# Patient Record
Sex: Male | Born: 1945
Health system: Southern US, Community
[De-identification: ages and names within clinical notes are randomized; demographics above are authoritative.]

## PROBLEM LIST (undated history)

## (undated) DIAGNOSIS — R7881 Bacteremia: Secondary | ICD-10-CM

## (undated) DIAGNOSIS — I5042 Chronic combined systolic (congestive) and diastolic (congestive) heart failure: Secondary | ICD-10-CM

## (undated) DIAGNOSIS — Z91199 Patient's noncompliance with other medical treatment and regimen due to unspecified reason: Secondary | ICD-10-CM

## (undated) DIAGNOSIS — R1084 Generalized abdominal pain: Secondary | ICD-10-CM

## (undated) DIAGNOSIS — I509 Heart failure, unspecified: Secondary | ICD-10-CM

## (undated) DIAGNOSIS — I330 Acute and subacute infective endocarditis: Secondary | ICD-10-CM

## (undated) DIAGNOSIS — B9562 Methicillin resistant Staphylococcus aureus infection as the cause of diseases classified elsewhere: Secondary | ICD-10-CM

## (undated) DIAGNOSIS — G459 Transient cerebral ischemic attack, unspecified: Secondary | ICD-10-CM

## (undated) DIAGNOSIS — I350 Nonrheumatic aortic (valve) stenosis: Secondary | ICD-10-CM

## (undated) DIAGNOSIS — Z9119 Patient's noncompliance with other medical treatment and regimen: Secondary | ICD-10-CM

## (undated) DIAGNOSIS — E46 Unspecified protein-calorie malnutrition: Secondary | ICD-10-CM

## (undated) DIAGNOSIS — I10 Essential (primary) hypertension: Secondary | ICD-10-CM

## (undated) DIAGNOSIS — I2510 Atherosclerotic heart disease of native coronary artery without angina pectoris: Secondary | ICD-10-CM

## (undated) DIAGNOSIS — I4891 Unspecified atrial fibrillation: Secondary | ICD-10-CM

## (undated) DIAGNOSIS — R634 Abnormal weight loss: Secondary | ICD-10-CM

## (undated) HISTORY — PX: AORTIC VALVE REPLACEMENT: SHX41

## (undated) HISTORY — PX: HERNIA REPAIR: SHX51

## (undated) NOTE — *Deleted (*Deleted)
weakness

---
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
## 2013-10-16 DIAGNOSIS — Z953 Presence of xenogenic heart valve: Secondary | ICD-10-CM

## 2013-10-16 HISTORY — PX: CORONARY ARTERY BYPASS GRAFT: SHX141

## 2013-10-16 HISTORY — DX: Presence of xenogenic heart valve: Z95.3

## 2014-04-14 ENCOUNTER — Emergency Department (HOSPITAL_COMMUNITY): Payer: Self-pay

## 2014-04-14 ENCOUNTER — Emergency Department (HOSPITAL_COMMUNITY)
Admission: EM | Admit: 2014-04-14 | Discharge: 2014-04-14 | Disposition: A | Discharge status: Home / Self Care | Payer: Self-pay | Attending: Emergency Medicine | Admitting: Emergency Medicine

## 2014-04-14 ENCOUNTER — Emergency Department (HOSPITAL_COMMUNITY): Payer: Medicare Other

## 2014-04-14 ENCOUNTER — Encounter (HOSPITAL_COMMUNITY): Payer: Self-pay | Admitting: Emergency Medicine

## 2014-04-14 DIAGNOSIS — Z87891 Personal history of nicotine dependence: Secondary | ICD-10-CM | POA: Insufficient documentation

## 2014-04-14 DIAGNOSIS — Z7982 Long term (current) use of aspirin: Secondary | ICD-10-CM | POA: Insufficient documentation

## 2014-04-14 DIAGNOSIS — I10 Essential (primary) hypertension: Secondary | ICD-10-CM | POA: Insufficient documentation

## 2014-04-14 DIAGNOSIS — Z79899 Other long term (current) drug therapy: Secondary | ICD-10-CM | POA: Insufficient documentation

## 2014-04-14 DIAGNOSIS — R110 Nausea: Secondary | ICD-10-CM | POA: Insufficient documentation

## 2014-04-14 DIAGNOSIS — R531 Weakness: Secondary | ICD-10-CM

## 2014-04-14 DIAGNOSIS — R630 Anorexia: Secondary | ICD-10-CM | POA: Insufficient documentation

## 2014-04-14 DIAGNOSIS — IMO0001 Reserved for inherently not codable concepts without codable children: Secondary | ICD-10-CM | POA: Insufficient documentation

## 2014-04-14 DIAGNOSIS — K089 Disorder of teeth and supporting structures, unspecified: Secondary | ICD-10-CM | POA: Insufficient documentation

## 2014-04-14 DIAGNOSIS — Z792 Long term (current) use of antibiotics: Secondary | ICD-10-CM | POA: Insufficient documentation

## 2014-04-14 DIAGNOSIS — R5383 Other fatigue: Principal | ICD-10-CM

## 2014-04-14 DIAGNOSIS — I509 Heart failure, unspecified: Secondary | ICD-10-CM | POA: Insufficient documentation

## 2014-04-14 DIAGNOSIS — R634 Abnormal weight loss: Secondary | ICD-10-CM | POA: Insufficient documentation

## 2014-04-14 DIAGNOSIS — R5381 Other malaise: Secondary | ICD-10-CM | POA: Insufficient documentation

## 2014-04-14 HISTORY — DX: Heart failure, unspecified: I50.9

## 2014-04-14 HISTORY — DX: Essential (primary) hypertension: I10

## 2014-04-14 LAB — CBC WITH DIFFERENTIAL/PLATELET
Basophils Absolute: 0 10*3/uL (ref 0.0–0.1)
Basophils Relative: 0 % (ref 0–1)
Eosinophils Absolute: 0 10*3/uL (ref 0.0–0.7)
Eosinophils Relative: 0 % (ref 0–5)
HCT: 31.4 % — ABNORMAL LOW (ref 39.0–52.0)
Hemoglobin: 10.2 g/dL — ABNORMAL LOW (ref 13.0–17.0)
Lymphocytes Relative: 17 % (ref 12–46)
Lymphs Abs: 0.7 10*3/uL (ref 0.7–4.0)
MCH: 26.4 pg (ref 26.0–34.0)
MCHC: 32.5 g/dL (ref 30.0–36.0)
MCV: 81.3 fL (ref 78.0–100.0)
Monocytes Absolute: 0.3 10*3/uL (ref 0.1–1.0)
Monocytes Relative: 8 % (ref 3–12)
Neutro Abs: 2.9 10*3/uL (ref 1.7–7.7)
Neutrophils Relative %: 75 % (ref 43–77)
Platelets: 80 10*3/uL — ABNORMAL LOW (ref 150–400)
RBC: 3.86 MIL/uL — ABNORMAL LOW (ref 4.22–5.81)
RDW: 16.7 % — ABNORMAL HIGH (ref 11.5–15.5)
WBC: 3.9 10*3/uL — ABNORMAL LOW (ref 4.0–10.5)

## 2014-04-14 LAB — URINE MICROSCOPIC-ADD ON

## 2014-04-14 LAB — URINALYSIS, ROUTINE W REFLEX MICROSCOPIC
Bilirubin Urine: NEGATIVE
Glucose, UA: NEGATIVE mg/dL
Hgb urine dipstick: NEGATIVE
Ketones, ur: NEGATIVE mg/dL
Leukocytes, UA: NEGATIVE
Nitrite: NEGATIVE
Protein, ur: 30 mg/dL — AB
Specific Gravity, Urine: 1.025 (ref 1.005–1.030)
Urobilinogen, UA: 0.2 mg/dL (ref 0.0–1.0)
pH: 5 (ref 5.0–8.0)

## 2014-04-14 LAB — CBG MONITORING, ED: Glucose-Capillary: 123 mg/dL — ABNORMAL HIGH (ref 70–99)

## 2014-04-14 LAB — COMPREHENSIVE METABOLIC PANEL
ALT: 13 U/L (ref 0–53)
AST: 22 U/L (ref 0–37)
Albumin: 2.7 g/dL — ABNORMAL LOW (ref 3.5–5.2)
Alkaline Phosphatase: 45 U/L (ref 39–117)
BUN: 26 mg/dL — ABNORMAL HIGH (ref 6–23)
CO2: 23 mEq/L (ref 19–32)
Calcium: 8.2 mg/dL — ABNORMAL LOW (ref 8.4–10.5)
Chloride: 102 mEq/L (ref 96–112)
Creatinine, Ser: 1.35 mg/dL (ref 0.50–1.35)
GFR calc Af Amer: 62 mL/min — ABNORMAL LOW (ref >90)
GFR calc non Af Amer: 53 mL/min — ABNORMAL LOW (ref >90)
Glucose, Bld: 116 mg/dL — ABNORMAL HIGH (ref 70–99)
Potassium: 4.3 mEq/L (ref 3.7–5.3)
Sodium: 137 mEq/L (ref 137–147)
Total Bilirubin: 0.8 mg/dL (ref 0.3–1.2)
Total Protein: 6.4 g/dL (ref 6.0–8.3)

## 2014-04-14 LAB — LACTIC ACID, PLASMA: Lactic Acid, Venous: 1.1 mmol/L (ref 0.5–2.2)

## 2014-04-14 LAB — CK: Total CK: 13 U/L (ref 7–232)

## 2014-04-14 MED ORDER — SODIUM CHLORIDE 0.9 % IV BOLUS (SEPSIS)
500.0000 mL | Freq: Once | INTRAVENOUS | Status: AC
  Administered 2014-04-14: 500 mL via INTRAVENOUS

## 2014-04-14 NOTE — ED Notes (Signed)
His daughter brought him today because she states he is unable to care for himself at home. He states hes felt "weaker and weaker since my hernia in January." He c/o pain all over his body. He is A&Ox4, resp e/u

## 2014-04-14 NOTE — ED Notes (Signed)
MD at bedside. 

## 2014-04-14 NOTE — ED Notes (Signed)
Walked pt in hallway. Pt's gait was steady. Pt stated he felt weak.

## 2014-04-14 NOTE — ED Provider Notes (Signed)
CSN: [id number]     Arrival date & time 04/14/14  0907 History   First MD Initiated Contact with Patient 04/14/14 [phone]     Chief Complaint  Patient presents with  . Weakness     (Consider location/radiation/quality/duration/timing/severity/associated sxs/prior Treatment) Patient is a 68 y.o. male presenting with weakness. The history is provided by the patient and a relative. No language interpreter was used.  Weakness The current episode started more than 1 month ago. The problem occurs constantly. The problem has been gradually worsening. Associated symptoms include fatigue, myalgias, nausea and weakness. Pertinent negatives include no abdominal pain, chest pain, coughing, fever, headaches, rash or vomiting. Associated symptoms comments: The patient lives alone in [place] where his daughter went to visit him. She found him weak, "crawling on the floor to get from place to place", with significant weight loss. He states his appetite is completely gone. He has mild nausea without vomiting, loose stool without blood or frequency. He feels sore all over without specific site of pain. He feels generally weak without weakness on one side or another. .    Past Medical History  Diagnosis Date  . CHF (congestive heart failure)   . Hypertension    Past Surgical History  Procedure Laterality Date  . Hernia repair     History reviewed. No pertinent family history. History  Substance Use Topics  . Smoking status: Former Games developermoker  . Smokeless tobacco: Not on file  . Alcohol Use: No    Review of Systems  Constitutional: Positive for appetite change, fatigue and unexpected weight change. Negative for fever.  HENT: Positive for dental problem. Negative for trouble swallowing.   Respiratory: Negative for cough and shortness of breath.   Cardiovascular: Negative for chest pain and leg swelling.  Gastrointestinal: Positive for nausea. Negative for vomiting, abdominal pain and blood in stool.   Genitourinary: Negative for dysuria.  Musculoskeletal: Positive for myalgias.  Skin: Negative for rash and wound.  Neurological: Positive for weakness. Negative for syncope and headaches.  Psychiatric/Behavioral: Negative for confusion.      Allergies  Review of patient's allergies indicates no known allergies.  Home Medications   Prior to Admission medications   Medication Sig Start Date End Date Taking? Authorizing Provider  aspirin 325 MG tablet Take 325 mg by mouth daily.   Yes Historical Provider, MD  ciprofloxacin (CIPRO) 500 MG tablet Take 500 mg by mouth 2 (two) times daily. 04/01/14  Yes Historical Provider, MD  co-enzyme Q-10 30 MG capsule Take 30 mg by mouth daily. "FORCE FACTOR"   Yes Historical Provider, MD  Cyanocobalamin (VITAMIN B-12 PO) Take 1 tablet by mouth daily.   Yes Historical Provider, MD  DC Green 6 POWD Take 5 mLs by mouth daily. Mix with water   Yes Historical Provider, MD  furosemide (LASIX) 80 MG tablet Take 80 mg by mouth daily.   Yes Historical Provider, MD  Ginger, Zingiber officinalis, (GINGER ROOT PO) Take 1 tablet by mouth daily.   Yes Historical Provider, MD  HYDROcodone-acetaminophen (NORCO/VICODIN) 5-325 MG per tablet Take 1 tablet by mouth every 4 (four) hours as needed for moderate pain.   Yes Historical Provider, MD  LYSINE PO Take 1 tablet by mouth daily.   Yes Historical Provider, MD  metroNIDAZOLE (FLAGYL) 500 MG tablet Take 500 mg by mouth every 8 (eight) hours.   Yes Historical Provider, MD  Misc Natural Products (DANDELION ROOT PO) Take 1 tablet by mouth daily.   Yes Historical Provider, MD  Misc Natural Products (MULTI-HERB PO) Take 1 tablet by mouth daily.   Yes Historical Provider, MD  ondansetron (ZOFRAN-ODT) 4 MG disintegrating tablet Take 4 mg by mouth every 6 (six) hours as needed for nausea or vomiting.   Yes Historical Provider, MD  OVER THE COUNTER MEDICATION Take 1 tablet by mouth daily. "BIOTRUST"   Yes Historical Provider, MD   promethazine (PHENERGAN) 25 MG tablet Take 25 mg by mouth 3 (three) times daily as needed for nausea or vomiting.   Yes Historical Provider, MD  spironolactone (ALDACTONE) 25 MG tablet Take 12.5 mg by mouth daily.   Yes Historical Provider, MD   There were no vitals taken for this visit. Physical Exam  Constitutional: He is oriented to person, place, and time. He appears well-developed and well-nourished. No distress.  HENT:  Head: Normocephalic and atraumatic.  Eyes: Conjunctivae are normal. Pupils are equal, round, and reactive to light.  Neck: Normal range of motion. Neck supple.  Cardiovascular: Normal rate and regular rhythm.   Pulmonary/Chest: Effort normal and breath sounds normal.  Abdominal: Soft. Bowel sounds are normal. There is no tenderness. There is no rebound and no guarding.  Musculoskeletal: Normal range of motion. He exhibits no edema.  Neurological: He is alert and oriented to person, place, and time.  No lateralizing weakness or facial asymmetry. Speech clear and focused. CN's 3-12 grossly intact.   Skin: Skin is warm and dry. No rash noted.  Psychiatric: He has a normal mood and affect.    ED Course  Procedures (including critical care time) Labs Review Labs Reviewed  CBG MONITORING, ED - Abnormal; Notable for the following:    Glucose-Capillary 123 (*)    All other components within normal limits  URINE CULTURE  CBC WITH DIFFERENTIAL  COMPREHENSIVE METABOLIC PANEL  URINALYSIS, ROUTINE W REFLEX MICROSCOPIC  LACTIC ACID, PLASMA   Results for orders placed during the hospital encounter of 04/14/14  URINE CULTURE      Result Value Ref Range   Specimen Description URINE, RANDOM     Special Requests NONE     Culture  Setup Time       Value: 04/14/2014 17:20     Performed at [facility] Count       Value: NO GROWTH     Performed at [facility]   Culture       Value: NO GROWTH     Performed at [facility]   Report  Status 04/15/2014 FINAL    CBC WITH DIFFERENTIAL      Result Value Ref Range   WBC 3.9 (*) 4.0 - 10.5 K/uL   RBC 3.86 (*) 4.22 - 5.81 MIL/uL   Hemoglobin 10.2 (*) 13.0 - 17.0 g/dL   HCT 56.2 (*) 13.0 - 86.5 %   MCV 81.3  78.0 - 100.0 fL   MCH 26.4  26.0 - 34.0 pg   MCHC 32.5  30.0 - 36.0 g/dL   RDW 78.4 (*) 69.6 - 29.5 %   Platelets 80 (*) 150 - 400 K/uL   Neutrophils Relative % 75  43 - 77 %   Neutro Abs 2.9  1.7 - 7.7 K/uL   Lymphocytes Relative 17  12 - 46 %   Lymphs Abs 0.7  0.7 - 4.0 K/uL   Monocytes Relative 8  3 - 12 %   Monocytes Absolute 0.3  0.1 - 1.0 K/uL   Eosinophils Relative 0  0 - 5 %  Eosinophils Absolute 0.0  0.0 - 0.7 K/uL   Basophils Relative 0  0 - 1 %   Basophils Absolute 0.0  0.0 - 0.1 K/uL  COMPREHENSIVE METABOLIC PANEL      Result Value Ref Range   Sodium 137  137 - 147 mEq/L   Potassium 4.3  3.7 - 5.3 mEq/L   Chloride 102  96 - 112 mEq/L   CO2 23  19 - 32 mEq/L   Glucose, Bld 116 (*) 70 - 99 mg/dL   BUN 26 (*) 6 - 23 mg/dL   Creatinine, Ser 1.611.35  0.50 - 1.35 mg/dL   Calcium 8.2 (*) 8.4 - 10.5 mg/dL   Total Protein 6.4  6.0 - 8.3 g/dL   Albumin 2.7 (*) 3.5 - 5.2 g/dL   AST 22  0 - 37 U/L   ALT 13  0 - 53 U/L   Alkaline Phosphatase 45  39 - 117 U/L   Total Bilirubin 0.8  0.3 - 1.2 mg/dL   GFR calc non Af Amer 53 (*) >90 mL/min   GFR calc Af Amer 62 (*) >90 mL/min  URINALYSIS, ROUTINE W REFLEX MICROSCOPIC      Result Value Ref Range   Color, Urine AMBER (*) YELLOW   APPearance CLOUDY (*) CLEAR   Specific Gravity, Urine 1.025  1.005 - 1.030   pH 5.0  5.0 - 8.0   Glucose, UA NEGATIVE  NEGATIVE mg/dL   Hgb urine dipstick NEGATIVE  NEGATIVE   Bilirubin Urine NEGATIVE  NEGATIVE   Ketones, ur NEGATIVE  NEGATIVE mg/dL   Protein, ur 30 (*) NEGATIVE mg/dL   Urobilinogen, UA 0.2  0.0 - 1.0 mg/dL   Nitrite NEGATIVE  NEGATIVE   Leukocytes, UA NEGATIVE  NEGATIVE  LACTIC ACID, PLASMA      Result Value Ref Range   Lactic Acid, Venous 1.1  0.5 - 2.2  mmol/L  URINE MICROSCOPIC-ADD ON      Result Value Ref Range   WBC, UA 0-2  <3 WBC/hpf   Bacteria, UA RARE  RARE   Casts HYALINE CASTS (*) NEGATIVE   Urine-Other MUCOUS PRESENT    CK      Result Value Ref Range   Total CK 13  7 - 232 U/L  CBG MONITORING, ED      Result Value Ref Range   Glucose-Capillary 123 (*) 70 - 99 mg/dL   Ct Head Wo Contrast  04/14/2014   CLINICAL DATA:  Weakness  EXAM: CT HEAD WITHOUT CONTRAST  TECHNIQUE: Contiguous axial images were obtained from the base of the skull through the vertex without intravenous contrast.  COMPARISON:  None.  FINDINGS: Mild atrophy. Mild chronic microvascular ischemic change in the white matter.  Negative for acute infarct, hemorrhage, or mass lesion. Calvarium is intact.  IMPRESSION: Mild chronic microvascular ischemia.  No acute abnormality.   Electronically Signed   By: Marlan Palauharles  Clark M.D.   On: 04/14/2014 13:41   Dg Abd Acute W/chest  04/14/2014   CLINICAL DATA:  WEAKNESS  EXAM: ACUTE ABDOMEN SERIES (ABDOMEN 2 VIEW & CHEST 1 VIEW)  COMPARISON:  None.  FINDINGS: There is no evidence of dilated bowel loops or free intraperitoneal air. No radiopaque calculi or other significant radiographic abnormality is seen. Heart size and mediastinal contours are within normal limits. Both lungs are clear. 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 The aorta is tortuous and ectatic.  IMPRESSION: Negative abdominal radiographs.  No acute cardiopulmonary disease.   Electronically Signed   By:  Salome Holmes M.D.   On: 04/14/2014 10:52   Imaging Review No results found.   EKG Interpretation None      MDM   Final diagnoses:  None    1. Weakness 2. Weight loss  The patient is eating and drinking here. Given fluid bolus. Labs and imaging are essentially without finding. He ambulates to and from the bathroom. Daughter remains with patient throughout the hospital stay. Discussed outpatient care and follow up. Dr. Ethelda Chick has seen and evaluated  the patient. Feel that discharge home is appropriate with referral for outpatient care.     Arnoldo Hooker, PA-C 04/23/14 [phone]

## 2014-04-14 NOTE — ED Notes (Signed)
Gave the pt orange juice watered down, broth and crackers since pt has not been able to eat in a week days.

## 2014-04-14 NOTE — ED Notes (Signed)
Pt walked to the bathroom independently without any assistance

## 2014-04-14 NOTE — ED Notes (Signed)
Pt stated after being weighed that he has lost 11 lbs in one week.

## 2014-04-14 NOTE — ED Provider Notes (Signed)
Patient reports she's had diminished appetite for several months. And he complains of generalized weakness. He reports a 40 pound weight loss since February 2015 since he had his inguinal hernias repaired. He denies "pain all over" complains of generalized weakness. Denies shortness of breath denies fever. States food does not taste good . He is currently staying with his daughter. Mood appeared 2 days ago from [place]. He states that he feels depressed but vehemently denies suicidal intent. On exam no distress. Alert nontoxic lungs clear auscultation heart regular rate and rhythm abdomen nondistended nontender. He does have a left hernias which a soft, nontender and easily reproducible. Of note is a right inguinal hernia scar. Also soft, nontender genitalia normal male. Patient is encouraged to drink. He is not lightheaded on standing. Gait is normal He will be referred to a dentist, and urgent care Center or internist of his choice. Results for orders placed during the hospital encounter of 04/14/14  CBC WITH DIFFERENTIAL      Result Value Ref Range   WBC 3.9 (*) 4.0 - 10.5 K/uL   RBC 3.86 (*) 4.22 - 5.81 MIL/uL   Hemoglobin 10.2 (*) 13.0 - 17.0 g/dL   HCT 96.031.4 (*) 45.439.0 - 09.852.0 %   MCV 81.3  78.0 - 100.0 fL   MCH 26.4  26.0 - 34.0 pg   MCHC 32.5  30.0 - 36.0 g/dL   RDW 11.916.7 (*) 14.711.5 - 82.915.5 %   Platelets 80 (*) 150 - 400 K/uL   Neutrophils Relative % 75  43 - 77 %   Neutro Abs 2.9  1.7 - 7.7 K/uL   Lymphocytes Relative 17  12 - 46 %   Lymphs Abs 0.7  0.7 - 4.0 K/uL   Monocytes Relative 8  3 - 12 %   Monocytes Absolute 0.3  0.1 - 1.0 K/uL   Eosinophils Relative 0  0 - 5 %   Eosinophils Absolute 0.0  0.0 - 0.7 K/uL   Basophils Relative 0  0 - 1 %   Basophils Absolute 0.0  0.0 - 0.1 K/uL  COMPREHENSIVE METABOLIC PANEL      Result Value Ref Range   Sodium 137  137 - 147 mEq/L   Potassium 4.3  3.7 - 5.3 mEq/L   Chloride 102  96 - 112 mEq/L   CO2 23  19 - 32 mEq/L   Glucose, Bld 116  (*) 70 - 99 mg/dL   BUN 26 (*) 6 - 23 mg/dL   Creatinine, Ser 5.621.35  0.50 - 1.35 mg/dL   Calcium 8.2 (*) 8.4 - 10.5 mg/dL   Total Protein 6.4  6.0 - 8.3 g/dL   Albumin 2.7 (*) 3.5 - 5.2 g/dL   AST 22  0 - 37 U/L   ALT 13  0 - 53 U/L   Alkaline Phosphatase 45  39 - 117 U/L   Total Bilirubin 0.8  0.3 - 1.2 mg/dL   GFR calc non Af Amer 53 (*) >90 mL/min   GFR calc Af Amer 62 (*) >90 mL/min  URINALYSIS, ROUTINE W REFLEX MICROSCOPIC      Result Value Ref Range   Color, Urine AMBER (*) YELLOW   APPearance CLOUDY (*) CLEAR   Specific Gravity, Urine 1.025  1.005 - 1.030   pH 5.0  5.0 - 8.0   Glucose, UA NEGATIVE  NEGATIVE mg/dL   Hgb urine dipstick NEGATIVE  NEGATIVE   Bilirubin Urine NEGATIVE  NEGATIVE   Ketones, ur NEGATIVE  NEGATIVE mg/dL   Protein, ur 30 (*) NEGATIVE mg/dL   Urobilinogen, UA 0.2  0.0 - 1.0 mg/dL   Nitrite NEGATIVE  NEGATIVE   Leukocytes, UA NEGATIVE  NEGATIVE  LACTIC ACID, PLASMA      Result Value Ref Range   Lactic Acid, Venous 1.1  0.5 - 2.2 mmol/L  URINE MICROSCOPIC-ADD ON      Result Value Ref Range   WBC, UA 0-2  <3 WBC/hpf   Bacteria, UA RARE  RARE   Casts HYALINE CASTS (*) NEGATIVE   Urine-Other MUCOUS PRESENT    CK      Result Value Ref Range   Total CK 13  7 - 232 U/L  CBG MONITORING, ED      Result Value Ref Range   Glucose-Capillary 123 (*) 70 - 99 mg/dL   Ct Head Wo Contrast  04/14/2014   CLINICAL DATA:  Weakness  EXAM: CT HEAD WITHOUT CONTRAST  TECHNIQUE: Contiguous axial images were obtained from the base of the skull through the vertex without intravenous contrast.  COMPARISON:  None.  FINDINGS: Mild atrophy. Mild chronic microvascular ischemic change in the white matter.  Negative for acute infarct, hemorrhage, or mass lesion. Calvarium is intact.  IMPRESSION: Mild chronic microvascular ischemia.  No acute abnormality.   Electronically Signed   By: Marlan Palauharles  Clark M.D.   On: 04/14/2014 13:41   Dg Abd Acute W/chest  04/14/2014   CLINICAL DATA:   WEAKNESS  EXAM: ACUTE ABDOMEN SERIES (ABDOMEN 2 VIEW & CHEST 1 VIEW)  COMPARISON:  None.  FINDINGS: There is no evidence of dilated bowel loops or free intraperitoneal air. No radiopaque calculi or other significant radiographic abnormality is seen. Heart size and mediastinal contours are within normal limits. Both lungs are clear. 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 The aorta is tortuous and ectatic.  IMPRESSION: Negative abdominal radiographs.  No acute cardiopulmonary disease.   Electronically Signed   By: Salome HolmesHector  Cooper M.D.   On: 04/14/2014 10:52     Doug SouSam Tandrea Kommer, MD 04/14/14 1730

## 2014-04-14 NOTE — Discharge Instructions (Signed)
FOLLOW UP WITH DR. Mayford KnifeURNER FOR DENTAL ISSUES AND WITH [facility] OR [facility]ULT [facility]) FOR FURTHER OUTPATIENT MANAGEMENT. RETURN HERE WITH ANY HIGH FEVER, SEVERE PAIN OR NEW CONCERN.

## 2014-04-15 LAB — URINE CULTURE
Colony Count: NO GROWTH
Culture: NO GROWTH

## 2014-04-24 NOTE — ED Provider Notes (Signed)
Medical screening examination/treatment/procedure(s) were conducted as a shared visit with non-physician practitioner(s) and myself.  I personally evaluated the patient during the encounter.   EKG Interpretation   Date/Time:  Tuesday April 14 2014 09:33:03 EDT Ventricular Rate:  77 PR Interval:  211 QRS Duration: 101 QT Interval:  415 QTC Calculation: 470 R Axis:   32 Text Interpretation:  Age not entered, assumed to be  68 years old for  purpose of ECG interpretation Sinus rhythm Prolonged PR interval Left  ventricular hypertrophy ED PHYSICIAN INTERPRETATION AVAILABLE IN [facility] Confirmed by TEST, Record ([id number]) on 04/16/2014 7:03:26 AM       Doug SouSam Charleton Deyoung, MD 04/24/14 1450

## 2019-09-24 LAB — COVID-19: SARS-CoV-2: NOT DETECTED

## 2019-09-28 LAB — COVID-19: SARS-CoV-2: NOT DETECTED

## 2019-10-01 LAB — COVID-19: SARS-CoV-2: NOT DETECTED

## 2019-10-04 LAB — COVID-19: SARS-CoV-2: NOT DETECTED

## 2019-10-08 LAB — COVID-19: SARS-CoV-2: NOT DETECTED

## 2019-10-10 NOTE — ED Notes (Signed)
ED Triage Note       ED Secondary Triage Entered On:  10/10/2019 21:44 EST    Performed On:  10/10/2019 21:40 EST by PLOTKIN, RN, DANIELLE K               General Information   Barriers to Learning :   Acuity of Illness   ED Home Meds Section :   Document assessment   [facility] ED Fall Risk Section :   Document assessment   ED History Section :   Document assessment   ED Advance Directives Section :   Document assessment   ED Palliative Screen :   Document assessment   Burna Mortimer RN, Braulio Bosch - 10/10/2019 21:40 EST   (As Of: 10/10/2019 21:44:34 EST)   Problems(Active)    Atrial fibrillation (SNOMED CT  :95284132 )  Name of Problem:   Atrial fibrillation ; Recorder:   PLOTKIN, RN, Braulio Bosch; Confirmation:   Confirmed ; Classification:   Patient Stated ; Code:   [id number] ; Contributor System:   Conservation officer, nature ; Last Updated:   10/10/2019 21:43 EST ; Life Cycle Date:   10/10/2019 ; Life Cycle Status:   Active ; Vocabulary:   SNOMED CT        Heart failure (SNOMED CT  :536644034 )  Name of Problem:   Heart failure ; Recorder:   PLOTKIN, RN, DANIELLE K; Confirmation:   Confirmed ; Classification:   Patient Stated ; Code:   [id number] ; Contributor System:   Conservation officer, nature ; Last Updated:   10/10/2019 21:43 EST ; Life Cycle Date:   10/10/2019 ; Life Cycle Status:   Active ; Vocabulary:   SNOMED CT        HTN (hypertension) (SNOMED CT  :7564332951 )  Name of Problem:   HTN (hypertension) ; Recorder:   PLOTKIN, RN, Braulio Bosch; Confirmation:   Confirmed ; Classification:   Patient Stated ; Code:   [id number] ; Contributor System:   Conservation officer, nature ; Last Updated:   10/10/2019 21:43 EST ; Life Cycle Date:   10/10/2019 ; Life Cycle Status:   Active ; Vocabulary:   SNOMED CT          Diagnoses(Active)    Shortness of breath  Date:   10/10/2019 ; Diagnosis Type:   Reason For Visit ; Confirmation:   Complaint of ; Clinical Dx:   Shortness of breath ; Classification:   Medical ; Clinical Service:   Emergency medicine ; Code:   PNED ;  Probability:   0 ; Diagnosis Code:   [id number]             -    Procedure History   (As Of: 10/10/2019 21:44:34 EST)     Lennette Bihari Fall Risk Assessment Tool   Hx of falling last 3 months ED Fall :   No   Patient confused or disoriented ED Fall :   No   Patient intoxicated or sedated ED Fall :   No   Patient impaired gait ED Fall :   Yes   Use a mobility assistance device ED Fall :   Yes   Patient altered elimination ED Fall :   No   [facility] ED Fall Score :   2    PLOTKIN, RN, Andee Poles K - 10/10/2019 21:40 EST   ED Advance Directive   Advance Directive :   Yes   Type of Advance Directive :   Living will, Medical durable power of attorney   PLOTKIN, RN, Andee Poles  K - 10/10/2019 21:40 EST   Palliative Care   Does the Patient have a Life Limiting Illness :   CHF w/ significant dyspnea/swelling   ED Require Considerable Assistance :   Yes   Does the Patient Currently Receive Palliative or Hospice Care :   No   Jeanella Flattery RN, DANIELLE K - 10/10/2019 21:40 EST   Social History   Social History   (As Of: 10/10/2019 21:44:34 EST)   Tobacco:        Tobacco use: Former smoker, quit more than 30 days ago.   (Last Updated: 10/10/2019 21:44:17 EST by Jeanella Flattery, RN, Duwayne Heck K)            Med Hx   Medication List   (As Of: 10/10/2019 21:44:34 EST)   Home Meds    potassium chloride  :   potassium chloride ; Status:   Documented ; Ordered As Mnemonic:   potassium chloride 20 mEq oral tablet, extended release ; Simple Display Line:   20 mEq, 1 tabs, Oral, Daily, 0 Refill(s) ; Catalog Code:   potassium chloride ; Order Dt/Tm:   10/10/2019 21:43:11 EST          apixaban  :   apixaban ; Status:   Documented ; Ordered As Mnemonic:   Eliquis 5 mg oral tablet ; Simple Display Line:   5 mg, 1 tabs, Oral, BID, 60 tabs, 0 Refill(s) ; Catalog Code:   apixaban ; Order Dt/Tm:   10/10/2019 21:42:33 EST          dronabinol  :   dronabinol ; Status:   Documented ; Ordered As Mnemonic:   dronabinol 2.5 mg oral capsule ; Simple  Display Line:   2.5 mg, 1 caps, Oral, BID, 0 Refill(s) ; Catalog Code:   dronabinol ; Order Dt/Tm:   10/10/2019 21:42:21 EST          ferrous sulfate  :   ferrous sulfate ; Status:   Documented ; Ordered As Mnemonic:   ferrous sulfate 325 mg (65 mg elemental iron) oral delayed release tablet ; Simple Display Line:   mg, tabs, Oral, Daily, 0 Refill(s) ; Catalog Code:   ferrous sulfate ; Order Dt/Tm:   10/10/2019 21:42:45 EST          furosemide  :   furosemide ; Status:   Documented ; Ordered As Mnemonic:   Lasix 40 mg oral tablet ; Simple Display Line:   40 mg, 1 tabs, Oral, Daily, 0 Refill(s) ; Catalog Code:   furosemide ; Order Dt/Tm:   10/10/2019 21:42:54 EST          aspirin  :   aspirin ; Status:   Documented ; Ordered As Mnemonic:   Aspir 81 ; Simple Display Line:   mg, Oral, Daily, 0 Refill(s) ; Catalog Code:   aspirin ; Order Dt/Tm:   10/10/2019 21:41:35 EST          atorvastatin  :   atorvastatin ; Status:   Documented ; Ordered As Mnemonic:   atorvastatin 80 mg oral tablet ; Simple Display Line:   80 mg, 1 tabs, Oral, Daily, 0 Refill(s) ; Catalog Code:   atorvastatin ; Order Dt/Tm:   10/10/2019 21:41:43 EST          carvedilol  :   carvedilol ; Status:   Documented ; Ordered As Mnemonic:   carvedilol 3.125 mg oral tablet ; Simple Display Line:   mg, tabs, Oral, BID, 0 Refill(s) ; Catalog Code:  carvedilol ; Order Dt/Tm:   10/10/2019 21:41:58 EST

## 2019-10-10 NOTE — ED Notes (Signed)
ED Triage Note       ED Triage Adult Entered On:  10/10/2019 21:40 EST    Performed On:  10/10/2019 21:37 EST by Jeanella Flattery, RN, DANIELLE K               Triage   Chief Complaint :   pt resident at [facility]. pt complained of SOB. staff reports pt o2 sat in 80's. pt uses O2NC @2L  prn. denied sx improvement. inicreased to NC4L with improvement    Numeric Rating Pain Scale :   0 = No pain   Mode of Arrival :   Ambulance   Infectious Disease Documentation :   Document assessment   Temperature Oral :   37.3 degC(Converted to: 99.1 degF)    Heart Rate Monitored :   90 bpm   Respiratory Rate :   17 br/min   Systolic Blood Pressure :   115 mmHg   Diastolic Blood Pressure :   100 mmHg (HI)    SpO2 :   100 %   Oxygen Therapy :   Nasal  cannula   Oxygen Flow Rate :   4 L/min   Patient presentation :   None of the above   Chief Complaint or Presentation suggest infection :   Yes   Weight Dosing :   54 kg(Converted to: 119 lb 1 oz)    Height :   187 cm(Converted to: 6 ft 2 in)    Body Mass Index Dosing :   15 kg/m2   PLOTKIN, RN, DANIELLE K - 10/10/2019 21:37 EST   DCP GENERIC CODE   Tracking Acuity :   2   Tracking Group :   ED [address] [employer]   [address], RN, Jeanella Flattery - 10/10/2019 21:37 EST   ED General Section :   Document assessment   Pregnancy Status :   N/A   ED Allergies Section :   Document assessment   ED Reason for Visit Section :   Document assessment   ED Quick Assessment :   Patient appears awake, alert, oriented to baseline. Skin warm and dry. Moves all extremities. Respiration even and unlabored. Appears in no apparent distress.   10/12/2019 RNJeanella Flattery - 10/10/2019 21:37 EST   PTA/Triage Treatments   ED PTA Pre-Arrival Service :   [facility]   10/12/2019 - 10/10/2019 21:37 EST   ID Risk Screen Symptoms   Recent Travel History :   No recent travel   Close Contact with COVID-19 ID :   Yes   Last 14 days COVID-19 ID :   Yes - Results unknown/pending   TB Symptom Screen :   No symptoms   PLOTKIN,  RN, DANIELLE K - 10/10/2019 21:37 EST   ID COVID-19 Screen   Shortness of Breath ID :   Yes   PLOTKIN, RN, DANIELLE K - 10/10/2019 21:37 EST   Allergies   (As Of: 10/10/2019 21:40:34 EST)   Allergies (Active)   No Known Medication Allergies  Estimated Onset Date:   Unspecified ; Created By:   10/12/2019 RN, Jeanella Flattery K; Reaction Status:   Active ; Category:   Drug ; Substance:   No Known Medication Allergies ; Type:   Allergy ; Updated By:   Duwayne Heck; Reviewed Date:   10/10/2019 21:37 EST        Psycho-Social   Last 3 mo, thoughts killing self/others :   Patient denies   ED Behavioral  Activity Rating Scale :   4 - Quiet and awake (normal level of activity)   PLOTKIN, RN, Braulio Bosch - 10/10/2019 21:37 EST   ED Reason for Visit   (As Of: 10/10/2019 21:40:34 EST)   Diagnoses(Active)    Shortness of breath  Date:   10/10/2019 ; Diagnosis Type:   Reason For Visit ; Confirmation:   Complaint of ; Clinical Dx:   Shortness of breath ; Classification:   Medical ; Clinical Service:   Emergency medicine ; Code:   PNED ; Probability:   0 ; Diagnosis Code:   [id number]

## 2019-10-10 NOTE — ED Provider Notes (Signed)
Shortness of breath        Patient:   Jorge Allison, Jorge Allison            MRN: [id number]            FIN: [id number]               Age:   73 years     Sex:  Male     DOB:  01/14/1946   Associated Diagnoses:   Pneumonia; Hypoxia   Author:   Mason Burleigh-DO,  Norina Buzzard      Basic Information   Time seen: Provider Seen (ST)   ED Provider/Time:    Shyne Lehrke-DO,  Hriday Stai ASHLEY / 10/10/2019 21:56  .   Additional information: Chief Complaint from Nursing Triage Note   Chief Complaint  Chief Complaint: pt resident at [facility]. pt complained of SOB. staff reports pt o2 sat in 80's. pt uses O2NC '@2L'  prn. denied sx improvement. inicreased to NC4L with improvement (10/10/19 21:37:00).      History of Present Illness   The patient presents with difficulty breathing.  SOB for a few days. Wears 2 liters prn O2 at [facility]. Reports dry cough, SOB. Had to be placed on 4 liters NC. Denies chest pain, reports swelling in his legs are less than normal. .        Review of Systems   Constitutional symptoms:  No fever,    Respiratory symptoms:  Shortness of breath, no orthopnea, no cough.    Cardiovascular symptoms:  No chest pain,    Genitourinary symptoms:  No dysuria,              Additional review of systems information: All other systems reviewed and otherwise negative.      Health Status   Allergies:    Allergic Reactions (Selected)  No Known Medication Allergies.   Medications:  (Selected)   Documented Medications  Documented  Aspir 81: mg, Oral, Daily, 0 Refill(s)  Eliquis 5 mg oral tablet: 5 mg, 1 tabs, Oral, BID, 60 tabs, 0 Refill(s)  Lasix 40 mg oral tablet: 40 mg, 1 tabs, Oral, Daily, 0 Refill(s)  atorvastatin 80 mg oral tablet: 80 mg, 1 tabs, Oral, Daily, 0 Refill(s)  carvedilol 3.125 mg oral tablet: mg, tabs, Oral, BID, 0 Refill(s)  dronabinol 2.5 mg oral capsule: 2.5 mg, 1 caps, Oral, BID, 0 Refill(s)  ferrous sulfate 325 mg (65 mg elemental iron) oral delayed release tablet: mg, tabs, Oral, Daily, 0 Refill(s)  potassium chloride 20 mEq oral tablet,  extended release: 20 mEq, 1 tabs, Oral, Daily, 0 Refill(s).      Past Medical/ Family/ Social History   Medical history: Reviewed as documented in chart.   Surgical history: Reviewed as documented in chart.   Family history: Not significant.   Social history: Reviewed as documented in chart.   Problem list:    Active Problems (3)  Atrial fibrillation   Heart failure   HTN (hypertension)   .      Physical Examination               Vital Signs   Vital Signs   85/46/2703 50:09 EST Systolic Blood Pressure 381 mmHg     Diastolic Blood Pressure 71 mmHg     Peripheral Pulse Rate 93 bpm     Heart Rate Monitored 97 bpm     Respiratory Rate 17 br/min     SpO2 100 %    10/10/2019 21:59 EST Peripheral Pulse Rate 90 bpm (  Modified)    Heart Rate Monitored 91 bpm     Respiratory Rate 12 br/min     SpO2 99 %    42/35/3614 43:15 EST Systolic Blood Pressure 400 mmHg     Diastolic Blood Pressure 867 mmHg  HI     Temperature Oral 37.3 degC     Heart Rate Monitored 90 bpm     Respiratory Rate 17 br/min     SpO2 100 %    .   Measurements   10/10/2019 21:40 EST Body Mass Index est meas 15.44 kg/m2   10/10/2019 21:40 EST Body Mass Index Measured 15.44 kg/m2   10/10/2019 21:37 EST Height/Length Measured 187 cm    Weight Dosing 54 kg   .   Basic Oxygen Information   10/10/2019 22:42 EST Oxygen Flow Rate 3 L/min    SpO2 100 %   10/10/2019 21:59 EST Oxygen Therapy Nasal  cannula    Oxygen Flow Rate 4 L/min    SpO2 99 %   10/10/2019 21:37 EST Oxygen Therapy Nasal  cannula    Oxygen Flow Rate 4 L/min    SpO2 100 %   .   General:  Alert, no acute distress.    Skin:  Warm, dry, pink.    Eye:  Pupils are equal, round and reactive to light, extraocular movements are intact, normal conjunctiva.    Neck:  Supple, trachea midline, no tenderness, no JVD.    Cardiovascular:  Regular rate and rhythm, No murmur, Normal peripheral perfusion, No edema.    Respiratory:  Respirations: Tachypneic, Breath sounds: Bilateral, posterior, diminished, rhonchi  present, Cough: Moderate.    Gastrointestinal:  Soft, Nontender, Non distended.    Back:  Nontender, Normal range of motion, Normal alignment.    Musculoskeletal:  Normal ROM, normal strength, no tenderness, no swelling.    Neurological:  Alert and oriented to person, place, time, and situation, No focal neurological deficit observed, CN II-XII intact, normal sensory observed, normal motor observed, normal speech observed, normal coordination observed.    Psychiatric:  Cooperative.      Medical Decision Making   Documents reviewed:  Emergency department nurses' notes.   Electrocardiogram:  Emergency Provider interpretation performed by me, See ECG ED Review.    Results review:  Lab results : Lab View   10/10/2019 22:15 EST SARS-CoV-2 (GX) Not Detected    Influenza A (GX) Not Detected    Influenza B (GX) Not Detected    RSV (GX) Not Detected   10/10/2019 22:13 EST Estimated Creatinine Clearance 72.86 mL/min   10/10/2019 22:08 EST Lactic Acid Lvl 1.8 mmol/L   10/10/2019 21:51 EST WBC 6.1 x10e3/mcL    RBC 4.70 x10e6/mcL    Hgb 11.0 g/dL  LOW    HCT 37.6 %  LOW    MCV 80.0 fL  LOW    MCH 23.4 pg  LOW    MCHC 29.3 g/dL  LOW    RDW 25.6 %  HI    Platelet 137 x10e3/mcL  LOW    MPV 9.8 fL    Neutro Auto 74.0 %    Neutro Absolute 4.5 x10e3/mcL    Immature Grans Percent 0.3 %    Immature Grans Absolute 0.02 x10e3/mcL    Lymph Auto 15.2 %    Lymph Absolute 0.9 x10e3/mcL  LOW    Mono Auto 8.5 %    Mono Absolute 0.5 x10e3/mcL    Eosinophil Percent 1.3 %    Eos Absolute 0.1 x10e3/mcL  Basophil Auto 0.7 %    Baso Absolute 0.0 x10e3/mcL    NRBC Absolute Auto 0.000 x10e3/mcL    NRBC Percent Auto 0.0 %    Sodium Lvl 136 mmol/L    Potassium Lvl 4.6 mmol/L    Chloride 98 mmol/L    CO2 28 mmol/L    Glucose Random 99 mg/dL    BUN 28 mg/dL  HI    Creatinine Lvl 0.7 mg/dL    AGAP 10 mmol/L    Osmolality Calc 277 mOsm/kg    Calcium Lvl 9.0 mg/dL    eGFR AA 109 mL/min/1.41m???    eGFR Non-AA 94 mL/min/1.735m??    Trop T Quant 0.065 ng/mL   CRIT    Trop 5th Gen Study 88.0 ng/L  HI    NTproBNP 5,607 pg/mL  HI   .   Radiology results:  Rad Results (ST)   No qualifying data available..   Notes:  CTA bilateral pleural effusion, PNA.      Reexamination/ Reevaluation   Vital signs   Basic Oxygen Information   10/10/2019 22:42 EST Oxygen Flow Rate 3 L/min    SpO2 100 %   10/10/2019 21:59 EST Oxygen Therapy Nasal  cannula    Oxygen Flow Rate 4 L/min    SpO2 99 %   10/10/2019 21:37 EST Oxygen Therapy Nasal  cannula    Oxygen Flow Rate 4 L/min    SpO2 100 %         Impression and Plan   Diagnosis   Pneumonia (ICD10-CM J18.9, Discharge, Medical)   Hypoxia (ICD10-CM R09.02, Discharge, Medical)      Calls-Consults   -  10/11/2019 00:28:00 , WIFatima Sanger, recommends Inpt tele.    Plan   Condition: Stable.    Disposition: Admit time  10/11/2019 00:28:00, Admit to Inpatient Telemetry Unit.    Counseled: Patient, Regarding diagnosis, Regarding diagnostic results, Regarding treatment plan, Patient indicated understanding of instructions.    Signature Line     Electronically Signed on 10/11/2019 12:28 AM EST   ________________________________________________   Kalisha Keadle-DO,  Valeria Boza ASHLEY               Modified by: Mikaiya Tramble-DO,  Donata Reddick ASHLEY on 10/11/2019 12:28 AM EST

## 2019-10-10 NOTE — ED Notes (Signed)
ED Pre-Arrival Note        Pre-Arrival Summary    Name:  [facility] Med tec,    Current Date:  10/10/2019 21:37:04 EST  Gender:  Male  Date of Birth:    Age:  73  Pre-Arrival Type:  EMS  ETA:  10/10/2019 21:59:00 EST  Primary Care Physician:    Presenting Problem:  SOB/CP  Pre-Arrival User:  Burna Mortimer RN, Braulio Bosch  Referring Source:    Location:  01            PreArrival Communication Form  Emergency Department        Additional Patient Information:        Orders:  [    ] CBC                                            [     ] CT Head no contrast  [    ] BMP                                           [     ] CT Abdomen/Pelvis no contrast  [    ] PT/INR                                       [     ] CT Abdomen/Pelvis IV contrast, w/ oral contrast  [    ] Troponin                                   [     ] CT Abdomen/Pelvis IV contrast, no oral contrast  [    ] BNP                                            [     ] See ordersheet  [    ] CXR                                             [     ] Other:__________________________  [    ] EKG

## 2019-10-10 NOTE — ED Notes (Signed)
ED Note-Nursing       ED RN Reassessment Entered On:  10/10/2019 23:49 EST    Performed On:  10/10/2019 23:49 EST by Dan Humphreys, RN, [facility]               ED RN Reassessment   ED RN Progress Note :   CAOx3--NO SOB OR RESP. DIOFF OR DISTRESS--FOLLOWS COMMANDS APPROP.--STABLE.   Dan Humphreys RN, [facility] - 10/10/2019 23:49 EST

## 2019-10-11 LAB — BASIC METABOLIC PANEL
Anion Gap: 10 mmol/L (ref 2–17)
Anion Gap: 8 mmol/L (ref 2–17)
BUN: 28 mg/dL — ABNORMAL HIGH (ref 8–23)
BUN: 27 mg/dL — ABNORMAL HIGH (ref 8–23)
CO2: 28 mmol/L (ref 22–29)
CO2: 27 mmol/L (ref 22–29)
Calcium: 9 mg/dL (ref 8.8–10.2)
Calcium: 8.3 mg/dL — ABNORMAL LOW (ref 8.8–10.2)
Chloride: 98 mmol/L (ref 98–107)
Chloride: 101 mmol/L (ref 98–107)
Creatinine: 0.7 mg/dL (ref 0.7–1.3)
Creatinine: 0.6 mg/dL — ABNORMAL LOW (ref 0.7–1.3)
GFR African American: 109 mL/min/{1.73_m2} (ref >=90)
GFR African American: 116 mL/min/{1.73_m2} (ref >=90)
GFR Non-African American: 94 mL/min/{1.73_m2} (ref >=90)
GFR Non-African American: 100 mL/min/{1.73_m2} (ref >=90)
Glucose: 99 mg/dL (ref 70–99)
Glucose: 121 mg/dL — ABNORMAL HIGH (ref 70–99)
OSMOLALITY CALCULATED: 277 mOsm/kg (ref 270–287)
OSMOLALITY CALCULATED: 278 mOsm/kg (ref 270–287)
Potassium: 4.6 mmol/L (ref 3.5–5.3)
Potassium: 4.2 mmol/L (ref 3.5–5.3)
Sodium: 136 mmol/L (ref 135–145)
Sodium: 136 mmol/L (ref 135–145)

## 2019-10-11 LAB — CBC WITH AUTO DIFFERENTIAL
Absolute Baso #: 0 10*3/uL (ref 0.0–0.2)
Absolute Baso #: 0 10*3/uL (ref 0.0–0.2)
Absolute Eos #: 0.1 10*3/uL (ref 0.0–0.5)
Absolute Eos #: 0 10*3/uL (ref 0.0–0.5)
Absolute Lymph #: 0.9 10*3/uL — ABNORMAL LOW (ref 1.0–3.2)
Absolute Lymph #: 0.6 10*3/uL — ABNORMAL LOW (ref 1.0–3.2)
Absolute Mono #: 0.5 10*3/uL (ref 0.3–1.0)
Absolute Mono #: 0.2 10*3/uL — ABNORMAL LOW (ref 0.3–1.0)
Basophils %: 0.7 % (ref 0.0–2.0)
Basophils %: 0.3 % (ref 0.0–2.0)
Eosinophils %: 1.3 % (ref 0.0–7.0)
Eosinophils %: 0.2 % (ref 0.0–7.0)
Hematocrit: 37.6 % — ABNORMAL LOW (ref 38.0–52.0)
Hematocrit: 30.8 % — ABNORMAL LOW (ref 38.0–52.0)
Hemoglobin: 11 g/dL — ABNORMAL LOW (ref 13.0–17.3)
Hemoglobin: 8.8 g/dL — ABNORMAL LOW (ref 13.0–17.3)
Immature Grans (Abs): 0.02 10*3/uL (ref 0.00–0.06)
Immature Grans (Abs): 0.03 10*3/uL (ref 0.00–0.06)
Immature Granulocytes: 0.3 % (ref 0.1–0.6)
Immature Granulocytes: 0.5 % (ref 0.1–0.6)
Lymphocytes: 15.2 % (ref 15.0–45.0)
Lymphocytes: 9.8 % — ABNORMAL LOW (ref 15.0–45.0)
MCH: 23.4 pg — ABNORMAL LOW (ref 27.0–34.5)
MCH: 23.1 pg — ABNORMAL LOW (ref 27.0–34.5)
MCHC: 29.3 g/dL — ABNORMAL LOW (ref 32.0–36.0)
MCHC: 28.6 g/dL — ABNORMAL LOW (ref 32.0–36.0)
MCV: 80 fL — ABNORMAL LOW (ref 84.0–100.0)
MCV: 80.8 fL — ABNORMAL LOW (ref 84.0–100.0)
MPV: 9.8 fL (ref 7.2–13.2)
MPV: 10.5 fL (ref 7.2–13.2)
Monocytes: 8.5 % (ref 4.0–12.0)
Monocytes: 2.6 % — ABNORMAL LOW (ref 4.0–12.0)
NRBC Absolute: 0 10*3/uL (ref 0.000–0.012)
NRBC Absolute: 0 10*3/uL (ref 0.000–0.012)
NRBC Automated: 0 % (ref 0.0–0.2)
NRBC Automated: 0 % (ref 0.0–0.2)
Neutrophils %: 74 % (ref 42.0–74.0)
Neutrophils %: 86.6 % — ABNORMAL HIGH (ref 42.0–74.0)
Neutrophils Absolute: 4.5 10*3/uL (ref 1.6–7.3)
Neutrophils Absolute: 5.4 10*3/uL (ref 1.6–7.3)
Platelets: 137 10*3/uL — ABNORMAL LOW (ref 140–440)
Platelets: 116 10*3/uL — ABNORMAL LOW (ref 140–440)
RBC: 4.7 x10e6/mcL (ref 4.00–5.60)
RBC: 3.81 x10e6/mcL — ABNORMAL LOW (ref 4.00–5.60)
RDW: 25.6 % — ABNORMAL HIGH (ref 11.0–16.0)
RDW: 25.6 % — ABNORMAL HIGH (ref 11.0–16.0)
WBC: 6.1 10*3/uL (ref 3.8–10.6)
WBC: 6.3 10*3/uL (ref 3.8–10.6)

## 2019-10-11 LAB — LACTIC ACID: Lactic Acid: 1.8 mmol/L (ref 0.5–2.0)

## 2019-10-11 LAB — COVID-19, FLU A/B, AND RSV COMBO
RSV Antigen: NOT DETECTED
Rapid Influenza A Ag: NOT DETECTED
Rapid Influenza B Ag: NOT DETECTED
SARS-CoV-2, Rapid: NOT DETECTED

## 2019-10-11 LAB — TROPONIN T
Troponin T: 0.065 ng/mL (ref 0.000–0.010)
Troponin T: 0.053 ng/mL (ref 0.000–0.010)
Troponin T: 0.052 ng/mL (ref 0.000–0.010)

## 2019-10-11 LAB — TROPONIN: TROP 5TH GEN STUDY: 88 ng/L — ABNORMAL HIGH (ref 0.0–12.0)

## 2019-10-11 LAB — N TERMINAL PROBNP (AKA NTPROBNP): NT Pro-BNP: 5607 pg/mL — ABNORMAL HIGH (ref 0–125)

## 2019-10-11 LAB — PROCALCITONIN: Procalcitonin: 0.15 ng/mL (ref <=0.24)

## 2019-10-11 NOTE — ED Notes (Signed)
ED Note-Nursing       ED RN Reassessment Entered On:  10/11/2019 1:47 EST    Performed On:  10/11/2019 1:47 EST by Dan Humphreys, RN, [facility]               ED RN Reassessment   ED RN Progress Note :   TO ROOM 305 STABLE WITHOUT DISTRESS--NO SOB OR RESP. DIFF OR DISTRESS--   Dan Humphreys, RN, [facility] - 10/11/2019 1:47 EST

## 2019-10-11 NOTE — Nursing Note (Signed)
Adult Patient History Form-Text       Adult Patient History Entered On:  10/11/2019 2:03 EST    Performed On:  10/11/2019 1:55 EST by TRIA, RN, MARVI T               General Info   Patient Identified :   Identification band, Verbal   Patient Identified :   Jorge Allison   Information Given By :   Self   Preferred Mode of Communication :   Verbal   Accompanied By :   None   Pregnancy Status :   N/A   Has the patient received chemotherapy or immunotherapy (cytotoxic)  in the last 48-72 hours? :   No   In Clinical Trial With Signed Consent for Related Condition :   No signed consent for clinical trial   Is the patient currently (2-3 days) receiving radiation treatment? :   No   TRIA, RN, MARVI T - 10/11/2019 1:55 EST   Allergies   (As Of: 10/11/2019 02:03:55 EST)   Allergies (Active)   No Known Medication Allergies  Estimated Onset Date:   Unspecified ; Created By:   Jeanella Flattery RN, Duwayne Heck K; Reaction Status:   Active ; Category:   Drug ; Substance:   No Known Medication Allergies ; Type:   Allergy ; Updated By:   Valentina Gu; Reviewed Date:   10/10/2019 22:53 EST        Medication History   Medication List   (As Of: 10/11/2019 02:03:55 EST)   Normal Order    aspirin 81 mg Chew Tab  :   aspirin 81 mg Chew Tab ; Status:   Ordered ; Ordered As Mnemonic:   aspirin ; Simple Display Line:   81 mg, 1 tabs, Oral, Daily ; Ordering Provider:   Azalia Bilis; Catalog Code:   aspirin ; Order Dt/Tm:   10/11/2019 01:05:18 EST          aspirin  :   aspirin ; Status:   Voided ; Ordered As Mnemonic:   Aspir 81 ; Simple Display Line:   81 mg, Oral, Daily ; Ordering Provider:   Azalia Bilis; Catalog Code:   aspirin ; Order Dt/Tm:   10/11/2019 01:03:05 EST          atorvastatin 40 mg Tab  :   atorvastatin 40 mg Tab ; Status:   Ordered ; Ordered As Mnemonic:   atorvastatin ; Simple Display Line:   80 mg, 2 tabs, Oral, Daily ; Ordering Provider:   Azalia Bilis; Catalog Code:   atorvastatin ; Order Dt/Tm:    10/11/2019 01:03:06 EST          dronabinol 2.5 mg Cap  :   dronabinol 2.5 mg Cap ; Status:   Ordered ; Ordered As Mnemonic:   dronabinol ; Simple Display Line:   2.5 mg, 1 caps, Oral, BID ; Ordering Provider:   Azalia Bilis; Catalog Code:   dronabinol ; Order Dt/Tm:   10/11/2019 01:03:17 EST          ferrous sulfate 325 mg (65 mg elemental iron) Tab  :   ferrous sulfate 325 mg (65 mg elemental iron) Tab ; Status:   Ordered ; Ordered As Mnemonic:   ferrous sulfate ; Simple Display Line:   325 mg, 1 tabs, Oral, Daily ; Ordering Provider:   Azalia Bilis; Catalog Code:   ferrous sulfate ; Order Dt/Tm:  10/11/2019 01:05:34 EST          ferrous sulfate  :   ferrous sulfate ; Status:   Voided ; Ordered As Mnemonic:   ferrous sulfate 325 mg (65 mg elemental iron) oral delayed release tablet ; Simple Display Line:   1 tabs, Oral, Daily ; Ordering Provider:   Aliene Beams D; Catalog Code:   ferrous sulfate ; Order Dt/Tm:   10/11/2019 01:03:19 EST          furosemide 40 mg Tab  :   furosemide 40 mg Tab ; Status:   Ordered ; Ordered As Mnemonic:   Lasix ; Simple Display Line:   40 mg, 1 tabs, Oral, Daily ; Ordering Provider:   Azalia Bilis; Catalog Code:   furosemide ; Order Dt/Tm:   10/11/2019 01:03:21 EST          potassium chloride 20 mEq ER Tab  :   potassium chloride 20 mEq ER Tab ; Status:   Ordered ; Ordered As Mnemonic:   potassium chloride 20 mEq oral tablet, extended release ; Simple Display Line:   20 mEq, 1 tabs, Oral, Daily ; Ordering Provider:   Azalia Bilis; Catalog Code:   potassium chloride ; Order Dt/Tm:   10/11/2019 01:05:57 EST          potassium chloride  :   potassium chloride ; Status:   Voided ; Ordered As Mnemonic:   potassium chloride 20 mEq oral tablet, extended release ; Simple Display Line:   20 mEq, 1 tabs, Oral, Daily ; Ordering Provider:   Azalia Bilis; Catalog Code:   potassium chloride ; Order Dt/Tm:   10/11/2019 01:03:26 EST           carvedilol 3.125 mg Tab  :   carvedilol 3.125 mg Tab ; Status:   Ordered ; Ordered As Mnemonic:   carvedilol 3.125 mg oral tablet ; Simple Display Line:   3.125 mg, 1 tabs, Oral, BID ; Ordering Provider:   Aliene Beams D; Catalog Code:   carvedilol ; Order Dt/Tm:   10/11/2019 01:03:11 EST          methylPREDNISolone 125 mg preservative-free Inj  :   methylPREDNISolone 125 mg preservative-free Inj ; Status:   Completed ; Ordered As Mnemonic:   methylPREDNISolone (Solu-MEDROL) ; Simple Display Line:   125 mg, 1 EA, IV Push, Once ; Ordering Provider:   RUPP-DO,  REBECCA ASHLEY; Catalog Code:   methylPREDNISolone ; Order Dt/Tm:   10/11/2019 00:27:06 EST          cefepime 2 gm/137ml NS ADM  :   cefepime 2 gm/147ml NS ADM ; Status:   Completed ; Ordered As Mnemonic:   cefepime ; Simple Display Line:   2 g, 100 mL, 200 mL/hr, IV Piggyback, Once ; Ordering Provider:   RUPP-DO,  REBECCA ASHLEY; Catalog Code:   cefepime ; Order Dt/Tm:   10/11/2019 00:19:05 EST          Vancomycin 1gm/218ml NS ADM  :   Vancomycin 1gm/250ml NS ADM ; Status:   Completed ; Ordered As Mnemonic:   vancomycin ; Simple Display Line:   1 g, 250 mL, 250 mL/hr, IV Piggyback, Once ; Ordering Provider:   RUPP-DO,  REBECCA ASHLEY; Catalog Code:   vancomycin ; Order Dt/Tm:   10/11/2019 00:19:04 EST          iopamidol 76% Inj Soln 100 mL  :   iopamidol 76% Inj  Soln 100 mL ; Status:   Completed ; Ordered As Mnemonic:   [id number] ; Simple Display Line:   100 mL, IV Contrast, Once ; Ordering Provider:   RUPP-DO,  REBECCA ASHLEY; Catalog Code:   iopamidol ; Order Dt/Tm:   10/10/2019 22:53:55 EST          albuterol CFC free 90 mcg/inh Aerosol [chg per puff]  :   albuterol CFC free 90 mcg/inh Aerosol [chg per puff] ; Status:   Completed ; Ordered As Mnemonic:   Ventolin HFA 90 mcg/inh inhalation aerosol ; Simple Display Line:   720 mcg, 8 inh, Inhale, Once ; Ordering Provider:   RUPP-DO,  REBECCA ASHLEY; Catalog Code:   albuterol ; Order Dt/Tm:    10/10/2019 22:04:12 EST ; Comment:   With spacer            Home Meds    potassium chloride  :   potassium chloride ; Status:   Documented ; Ordered As Mnemonic:   potassium chloride 20 mEq oral tablet, extended release ; Simple Display Line:   20 mEq, 1 tabs, Oral, Daily, 0 Refill(s) ; Catalog Code:   potassium chloride ; Order Dt/Tm:   10/10/2019 21:43:11 EST          apixaban  :   apixaban ; Status:   Documented ; Ordered As Mnemonic:   Eliquis 5 mg oral tablet ; Simple Display Line:   5 mg, 1 tabs, Oral, BID, 60 tabs, 0 Refill(s) ; Catalog Code:   apixaban ; Order Dt/Tm:   10/10/2019 21:42:33 EST          dronabinol  :   dronabinol ; Status:   Documented ; Ordered As Mnemonic:   dronabinol 2.5 mg oral capsule ; Simple Display Line:   2.5 mg, 1 caps, Oral, BID, 0 Refill(s) ; Catalog Code:   dronabinol ; Order Dt/Tm:   10/10/2019 21:42:21 EST          ferrous sulfate  :   ferrous sulfate ; Status:   Documented ; Ordered As Mnemonic:   ferrous sulfate 325 mg (65 mg elemental iron) oral delayed release tablet ; Simple Display Line:   mg, tabs, Oral, Daily, 0 Refill(s) ; Catalog Code:   ferrous sulfate ; Order Dt/Tm:   10/10/2019 21:42:45 EST          furosemide  :   furosemide ; Status:   Documented ; Ordered As Mnemonic:   Lasix 40 mg oral tablet ; Simple Display Line:   40 mg, 1 tabs, Oral, Daily, 0 Refill(s) ; Catalog Code:   furosemide ; Order Dt/Tm:   10/10/2019 21:42:54 EST          aspirin  :   aspirin ; Status:   Documented ; Ordered As Mnemonic:   Aspir 81 ; Simple Display Line:   mg, Oral, Daily, 0 Refill(s) ; Catalog Code:   aspirin ; Order Dt/Tm:   10/10/2019 21:41:35 EST          atorvastatin  :   atorvastatin ; Status:   Documented ; Ordered As Mnemonic:   atorvastatin 80 mg oral tablet ; Simple Display Line:   80 mg, 1 tabs, Oral, Daily, 0 Refill(s) ; Catalog Code:   atorvastatin ; Order Dt/Tm:   10/10/2019 21:41:43 EST          carvedilol  :   carvedilol ; Status:   Documented ; Ordered As Mnemonic:    carvedilol 3.125 mg oral tablet ;  Simple Display Line:   mg, tabs, Oral, BID, 0 Refill(s) ; Catalog Code:   carvedilol ; Order Dt/Tm:   10/10/2019 21:41:58 EST            Problem History   (As Of: 10/11/2019 02:03:55 EST)   Problems(Active)    Atrial fibrillation (SNOMED CT  :31517616 )  Name of Problem:   Atrial fibrillation ; Recorder:   PLOTKIN, RN, Janetta Hora; Confirmation:   Confirmed ; Classification:   Patient Stated ; Code:   [id number] ; Contributor System:   Dietitian ; Last Updated:   10/10/2019 21:43 EST ; Life Cycle Date:   10/10/2019 ; Life Cycle Status:   Active ; Vocabulary:   SNOMED CT        Heart failure (SNOMED CT  :694854627 )  Name of Problem:   Heart failure ; Recorder:   PLOTKIN, RN, DANIELLE K; Confirmation:   Confirmed ; Classification:   Patient Stated ; Code:   [id number] ; Contributor System:   Dietitian ; Last Updated:   10/10/2019 21:43 EST ; Life Cycle Date:   10/10/2019 ; Life Cycle Status:   Active ; Vocabulary:   SNOMED CT        HTN (hypertension) (SNOMED CT  :8299371696 )  Name of Problem:   HTN (hypertension) ; Recorder:   PLOTKIN, RN, Janetta Hora; Confirmation:   Confirmed ; Classification:   Patient Stated ; Code:   [id number] ; Contributor System:   Dietitian ; Last Updated:   10/10/2019 21:43 EST ; Life Cycle Date:   10/10/2019 ; Life Cycle Status:   Active ; Vocabulary:   SNOMED CT          Diagnoses(Active)    Hypoxia  Date:   10/11/2019 ; Diagnosis Type:   Discharge ; Confirmation:   Confirmed ; Clinical Dx:   Hypoxia ; Classification:   Medical ; Clinical Service:   Non-Specified ; Code:   ICD-10-CM ; Probability:   0 ; Diagnosis Code:   R09.02      Pneumonia  Date:   10/11/2019 ; Diagnosis Type:   Discharge ; Confirmation:   Confirmed ; Clinical Dx:   Pneumonia ; Classification:   Medical ; Clinical Service:   Non-Specified ; Code:   ICD-10-CM ; Probability:   0 ; Diagnosis Code:   J18.9      Shortness of breath  Date:   10/10/2019 ; Diagnosis Type:   Reason For Visit ;  Confirmation:   Complaint of ; Clinical Dx:   Shortness of breath ; Classification:   Medical ; Clinical Service:   Emergency medicine ; Code:   PNED ; Probability:   0 ; Diagnosis Code:   [id number]        Procedure History        -    Procedure History   (As Of: 10/11/2019 02:03:55 EST)     Immunizations   Influenza Vaccine Status :   Received prior to admission, during current flu season   Influenza Vaccine Status :   Greater than 5 years   TRIA, RN, MARVI T - 10/11/2019 1:55 EST   ID Risk Screen Symptoms   Recent Travel History :   No recent travel   Close Contact with COVID-19 ID :   Yes   Last 14 days COVID-19 ID :   Yes - Not Detected (negative)   TB Symptom Screen :   No symptoms   C. diff Symptom/History ID :   Neither of the  above   Patient Pregnant :   None of the above   MRSA/VRE Screening :   Admitted from a Nursing Home or another healthcare facility   CRE Screening :   Not applicable   TRIA, RN, MARVI T - 10/11/2019 1:55 EST   ID COVID-19 Screen   Fever OR Chills :   No   Headache :   No   New or Worsening Cough :   Yes   Fatigue :   Yes   Shortness of Breath ID :   Yes   Myalgia (Muscle Pain) :   No   Dyspnea :   Yes   Diarrhea :   No   Sore Throat :   No   Nausea :   No   Laryngitis :   No   Sudden Loss of Taste or Smell :   Yes   TRIA, RN, MARVI T - 10/11/2019 1:55 EST   Bloodless Medicine   Is Blood Transfusion Acceptable to Patient :   Yes   TRIA, RN, MARVI T - 10/11/2019 1:55 EST   Nutrition   MST Does Your Current Diet Include :   None   MST Have You Recently Lost Weight Without Trying? :   Yes - see next question   MST If Yes, How Much Weight Have You Lost? :   34 lb or more   MST Weight Loss Score :   4    MST Have You Been Eating Poorly? :   No   MST Appetite Score :   0    MST Score :   4    MST Interpretation :   At risk   TRIA, RN, MARVI T - 10/11/2019 1:55 EST   Functional   Sensory Deficits :   Other: wears glasses   ADLs Prior to Admission :   Minimal  assistance   TRIA, RN, MARVI T - 10/11/2019 1:55 EST   Social History   Social History   (As Of: 10/11/2019 02:03:55 EST)   Tobacco:        Tobacco use: Former smoker, quit more than 30 days ago.   (Last Updated: 10/10/2019 21:44:17 EST by Jeanella FlatteryPLOTKIN, RN, Janetta HoraANIELLE K)            Spiritual   Faith/Denomination :   [employer]   Do you have a concern that you would like to address with a Chaplain? :   No   TRIA, RN, MARVI T - 10/11/2019 1:55 EST   Harm Screen   Injuries/Abuse/Neglect in Household :   Denies   Feels Unsafe at Home :   No   Agency(s)/Others notified :   No   Last 3 mo, thoughts killing self/others :   Patient denies   TRIA, RN, MARVI T - 10/11/2019 1:55 EST   Advance Directive   Advance Directive :   Yes   Type of Advance Directive :   Living will, Medical durable power of attorney   Melvenia NeedlesRIA, RN, MARVI T - 10/11/2019 1:55 EST   Education   Written Language :   [place]ox PondsEnglish   Primary Language :   Kendal HymenEnglish   TRIA, RN, MARVI T - 10/11/2019 1:55 EST   Caregiver/Advocate Language   Patient :   Verbal explanation, Demonstration   TRIA, RN, MARVI T - 10/11/2019 1:55 EST   Barriers to Learning :   Acuity of Illness   Teaching Method :   Demonstration, Explanation   TRIA, RN, MARVI  T - 10/11/2019 1:55 EST   Preventative Measures Information   Unit/Room Orientation :   Verbalizes understanding   Environmental Safety :   Verbalizes understanding   Hand Washing :   Verbalizes understanding   Infection Prevention :   Verbalizes understanding   DVT Prophylaxis :   Verbalizes understanding   Isolation Precaution :   Verbalizes understanding   TRIA, RN, MARVI T - 10/11/2019 1:55 EST   DC Needs   CM Living Situation :   [place] Caregiver Name/Relationship :   BRANDELL MAREADY [phone]   Arcola Jansky, RN, MARVI T - 10/11/2019 1:55 EST   Valuables and Belongings   Valuables and Belongings   At Bedside :   Clothes   TRIA, RN, MARVI T - 10/11/2019 1:55 EST   Admission Complete   Admission Complete :   Yes    TRIA, RN, MARVI T - 10/11/2019 1:55 EST

## 2019-10-11 NOTE — Progress Notes (Signed)
 Inpatient PT Examination - Text       Inpatient PT Examination Entered On:  10/11/2019 13:23 EST    Performed On:  10/11/2019 11:00 EST by HORSCHEL, PT, TIFFANY C               Reason for Treatment   Subjective Statement :   Pt agreeable to PT.  Pt states he has only been up to w/c at [facility] and that was with 2 person assist.       *Reason for Referral :   Pt admitted c B pneumonia and SOB.  Pt has long cardiac history and has been at [facility] SNF since d/c from [facility] earlier in month.    Precautions: 2L O2, condom catheter     HORSCHEL, PT, TIFFANY C - 10/11/2019 13:14 EST   General Info   Physical Therapy Orders :   PT Evaluation and Treatment Acute - 10/11/19 2:08:00 EST, Stop date 10/11/19 2:08:00 EST, A consult cannot be completed if there is a bedrest activity order; check for bedrest order.     Precautions RTF :    Communication to Nursing, 10/11/19 2:13:00 EST, RD may manage/modify diet order and/or enteral nutrition per approved MNT protocol, 10/11/19 2:13:00 EST, 10/11/19 2:13:00 EST, Ordered   Communication to Nursing, 10/11/19 2:12:00 EST, Discontinue IV, SQ, and PO ANTICOAGULANTS (except heparin flushes) including: Enoxaparin, Heparin, Bivalirudin, Argatroban, Dabigatran, Rivaroxaban, Edoxavan, Warfarin, Fondaparinux, 10/11/19 2:12:00 EST, 10/11/19 2:12:00 EST, Ordered   Notify Provider, 10/11/19 2:12:00 EST, If patient on IM medications, check with prescriber before administering by the IM route, 10/11/19 2:12:00 EST, 10/11/19 2:12:00 EST, Ordered   Notify Provider, 10/11/19 2:12:00 EST, If any abnormal bleeding or black, tarry stool, 10/11/19 2:12:00 EST, 10/11/19 2:12:00 EST, Ordered   Notify Provider, 10/11/19 2:12:00 EST, Do not administer and notify prescribing practitioner if INR greater than or equal to 2 for patients transitioning from warfarin, 10/11/19 2:12:00 EST, 10/11/19 2:12:00 EST, Ordered   Communication to Nursing, 10/11/19 2:08:00 EST, RD may manage/modify diet order and/or enteral nutrition  per approved MNT protocol, 10/11/19 2:08:00 EST, 10/11/19 2:08:00 EST, Ordered   Notify Provider Vital Signs, 10/11/19 2:08:00 EST, Refer to the Reporting Vital Sign Results to the Provider for Adult Acute Care Patients Policy., 10/11/19 2:08:00 EST, Ordered   Notify Rapid Response Team, 10/11/19 2:08:00 EST, For concerns regarding patient condition & notify MD, 10/11/19 2:08:00 EST, 10/11/19 2:08:00 EST, Ordered   COVID-19 Status, 10/11/19 1:45:58 EST, NOT VALID FOR pharmacy, laboratory, radiology., 10/11/19 1:45:58 EST, COVID-19 Not Detected, Ordered   Change attending to, 10/11/19 0:29:00 EST, LORINE NORLEEN BIRCH, Ordered   Patient Isolation, 10/10/19 21:56:00 EST, Contact and Airborne, Discontinued   ED Only Oxygen Therapy, 10/10/19 21:49:00 EST, Stop date 10/10/19 21:49:00 EST, Keep SAT > 92%, Completed   Notify MD/APP, 10/10/19 21:49:00 EST, If O2 is needed, 10/10/19 21:49:00 EST, 10/10/19 21:49:00 EST, Once, Completed   COVID-19 Status, 10/10/19 21:36:06 EST, NOT VALID FOR pharmacy, laboratory, radiology., 10/10/19 21:36:06 EST, COVID-19 PUI - under investigation, Discontinued     Affect/Behavior :   Anxious   Basic Command Following :   Intact   Safety/Judgment :   Impaired   Pain Present :   Yes actual or suspected pain   HORSCHEL, PT, TIFFANY C - 10/11/2019 13:14 EST   Problem List   (As Of: 10/11/2019 13:23:11 EST)   Problems(Active)    Atrial fibrillation (SNOMED CT  :17656987 )  Name of Problem:   Atrial fibrillation ; Recorder:  PLOTKIN, RN, EDSEL POUR; Confirmation:   Confirmed ; Classification:   Patient Stated ; Code:   17656987 ; Contributor System:   PowerChart ; Last Updated:   10/10/2019 21:43 EST ; Life Cycle Date:   10/10/2019 ; Life Cycle Status:   Active ; Vocabulary:   SNOMED CT        Heart failure (SNOMED CT  :860524986 )  Name of Problem:   Heart failure ; Recorder:   PLOTKIN, RN, DANIELLE K; Confirmation:   Confirmed ; Classification:   Patient Stated ; Code:   860524986 ;  Contributor System:   PowerChart ; Last Updated:   10/10/2019 21:43 EST ; Life Cycle Date:   10/10/2019 ; Life Cycle Status:   Active ; Vocabulary:   SNOMED CT        HTN (hypertension) (SNOMED CT  :8784255987 )  Name of Problem:   HTN (hypertension) ; Recorder:   PLOTKIN, RN, EDSEL POUR; Confirmation:   Confirmed ; Classification:   Patient Stated ; Code:   8784255987 ; Contributor System:   PowerChart ; Last Updated:   10/10/2019 21:43 EST ; Life Cycle Date:   10/10/2019 ; Life Cycle Status:   Active ; Vocabulary:   SNOMED CT        Severe malnutrition (SNOMED CT  :[id number] D04 )  Name of Problem:   Severe malnutrition ; Recorder:   Ethlyn Quant; Confirmation:   Confirmed ; Classification:   Medical ; Code:   [id number] I95 ; Contributor System:   PowerChart ; Last Updated:   10/11/2019 11:54 EST ; Life Cycle Date:   10/11/2019 ; Life Cycle Status:   Active ; Responsible Provider:   LORINE NORLEEN BIRCH; Vocabulary:   SNOMED CT        Shortness of breath (IMO  :72576 )  Name of Problem:   Shortness of breath ; Recorder:   SYSTEM,  SYSTEM; Confirmation:   Confirmed ; Classification:   Patient Stated ; Code:   72576 ; Last Updated:   10/11/2019 2:37 EST ; Life Cycle Date:   10/11/2019 ; Life Cycle Status:   Active ; Vocabulary:   IMO          Diagnoses(Active)    Hypoxia  Date:   10/11/2019 ; Diagnosis Type:   Discharge ; Confirmation:   Confirmed ; Clinical Dx:   Hypoxia ; Classification:   Medical ; Clinical Service:   Non-Specified ; Code:   ICD-10-CM ; Probability:   0 ; Diagnosis Code:   R09.02      Pneumonia  Date:   10/11/2019 ; Diagnosis Type:   Discharge ; Confirmation:   Confirmed ; Clinical Dx:   Pneumonia ; Classification:   Medical ; Clinical Service:   Non-Specified ; Code:   ICD-10-CM ; Probability:   0 ; Diagnosis Code:   J18.9      Shortness of breath  Date:   10/10/2019 ; Diagnosis Type:   Reason For Visit ; Confirmation:   Complaint of ; Clinical  Dx:   Shortness of breath ; Classification:   Medical ; Clinical Service:   Emergency medicine ; Code:   PNED ; Probability:   0 ; Diagnosis Code:   [id number]      Unsteadiness on feet  Date:   10/11/2019 ; Diagnosis Type:   Other ; Confirmation:   Differential ; Clinical Dx:   Unsteadiness on feet ; Classification:   Interdisciplinary ; Clinical Service:   Non-Specified ; Code:   ICD-10-CM ;  Probability:   0 ; Diagnosis Code:   R26.81        Pain Assessment   Pain Present :   Yes actual or suspected pain   Pain Present :   Leg   Laterality :   Bilateral   HORSCHEL, PT, TIFFANY C - 10/11/2019 13:14 EST   Home Environment   Living Environment :   Home Environment  Sensory Deficits:  Other: wears glasses  Performed By:  SHERELL RN, MARVI T 10/11/2019     Living Situation :   Skilled Nursing Facility - Short Term   [place], PT, TIFFANY C - 10/11/2019 13:14 EST   Home Environment II   Living Environment :   Home Environment  Sensory Deficits:  Other: wears glasses  Performed By:  SHERELL RN, MARVI T 10/11/2019     Additional Information :   Pt admitted from [facility] SNF.  Per pt, has not been ambulatory, on up to w/c recently.   HORSCHEL, PT, TIFFANY C - 10/11/2019 13:14 EST   LE Range/Strength   LE Overall Range of Motion Grid   Left Lower Extremity Active Range :   Within functional limits, limited knee extension B   Right Lower Extremity Active Range :   Within functional limits   HORSCHEL, PT, TIFFANY C - 10/11/2019 13:14 EST   Lt Lower Extremity Strength :   Within functional limits   HORSCHEL, PT, TIFFANY C - 10/11/2019 13:14 EST   Left Lower Extremity Strength Grid   Hip Flexion :   4   Knee Flexion :   4+   Knee Extension :   4+   Ankle Dorsiflexion :   4+   Ankle Plantarflexion :   4+   HORSCHEL, PT, TIFFANY C - 10/11/2019 13:14 EST   Rt Lower Extremity Strength :   Within functional limits   HORSCHEL, PT, TIFFANY C - 10/11/2019 13:14 EST   Right Lower Extremity Strength Grid   Hip Flexion :    4   Knee Extension :   4+   Ankle Dorsiflexion :   4+   Ankle Plantarflexion :   4+   Ankle Eversion :   4+   HORSCHEL, PT, TIFFANY C - 10/11/2019 13:14 EST   UE Strength/ROM   Upper Extremity Overall ROM Grid   Left Upper Extremity Active Range :   Within functional limits   (Comment: limited shoulder flexion [HORSCHEL, PT, TIFFANY C - 10/11/2019 13:14 EST] )   Right Upper Extremity Active Range :   Within functional limits   HORSCHEL, PT, TIFFANY C - 10/11/2019 13:14 EST   Lt Upper Extremity Strength :   Within functional limits   Rt Upper Extremity Strength :   Within functional limits   HORSCHEL, PT, TIFFANY C - 10/11/2019 13:14 EST   Mobility   Mobility Grid   Supine to Sit :   Rehab Minimal assistance, 1   Sit to Supine :   1, Rehab Minimal assistance   Scooting :   Rehab Minimal assistance, 1   Edge of Bed Assist :   Rehab Supervision   Transfer Sit to Stand :   Rehab Minimal assistance, 2, 1   (Comment: pt requested 2 person assist for comfort/safety [HORSCHEL, PT, TIFFANY C - 10/11/2019 13:14 EST] )   Transfer Stand to Sit :   2, Rehab Minimal assistance, 1   HORSCHEL, PT, TIFFANY C - 10/11/2019 13:14 EST   Ambulation Level Acute :  Contact guard assistance   Ambulation Quality :   cga/min A to sidestep 2' toward [facility] c RW. Pt fearful of falling    Ambulation Distance :   2 ft   Device :   Gait belt, Rolling walker   HORSCHEL, PT, TIFFANY C - 10/11/2019 13:14 EST   Balance   Balance Tests Performed :   Other: good sitting balance, good/fair standing balance   HORSCHEL, PT, TIFFANY C - 10/11/2019 13:14 EST   AM-PAC Basic Mobility   AM-PAC Basic Mobility   Turning from your back on your side while in a flat bed without using bedrails? :   A little = 3 points   Moving from lying on your back to sitting on the side of a flat bed without using bedrails? :   A little = 3 points   Moving to and from a bed to a chair (including a wheelchair)? :   A little = 3 points   Standing up from a chair using your arms (e.g.  wheelchair or bedside chair)? :   A little = 3 points   To walk in hospital room? :   A little = 3 points   HORSCHEL, PT, TIFFANY C - 10/11/2019 13:14 EST   AM-PAC Mobility Raw Score :   15    HORSCHEL, PT, TIFFANY C - 10/11/2019 13:14 EST   Assessment   PT Impairments or Limitations :   Ambulation deficits, Balance deficits, Bed mobility deficits, Range of motion deficits, Strength deficits, Transfer deficits   Discharge Recommendations :   Recommend return to SNF     D/CTransportation Recommendations :   No stretcher   D/C Transportation Recommendations Reviewed :   Yes   PT Treatment Recommendations :   Pt presents c impaired mobility and strength.  Pt is anxious and fearful of falling in standing.  Per pt he needed 2 person assist at [facility] to transfer to w/c, but pt only needed min A of 1 for sup-sit and stood c min A of 1-2 for safety, but needed little assistance.  Pt able to sidestep c RW toward [facility], but became more anxious and was fearful of falling even though his balance and stability were good.  Pt returned supine.  Pt stated he needed to have a BM and pt independently bridging for bedpan.   Nsg notified pt is safe to get up to [facility] c nsg assist.  Pt has good potential to improved mobility and pt needs continued PT.  Recommend return to SNF at d/c.     HORSCHEL, PT, TIFFANY C - 10/11/2019 13:14 EST   Long Term Goals   PT LT Goals Reviewed :   Yes   HORSCHEL, PT, TIFFANY C - 10/11/2019 13:14 EST   Outpatient PT Long Term Goals Rehab     Long Term Goal 1  Long Term Goal 2        Goal :    Pt will perform sup-sit-stand sba.   Pt will ambulate 38ft cga of 1-2 c RW.           Time Frame :    8 days from 10/11/19                HORSCHEL, PT, TIFFANY C - 10/11/2019 13:14 EST  HORSCHEL, PT, TIFFANY C - 10/11/2019 13:14 EST        Short Term Goals   PT ST Goals Reviewed :   Yes   HORSCHEL, PT, TIFFANY  C - 10/11/2019 13:14 EST   Other PT Goals Grid     Goal #1  Goal #2  Goal #3      Goal :    Pt will perform sup-sit  cga.   Pt will perform sit-stand cga.   Pt will ambulate 74ft min A of 1-2 for safety c RW.        Time Frame :    4 days from 10/11/19                HORSCHEL, PT, TIFFANY C - 10/11/2019 13:14 EST  HORSCHEL, PT, TIFFANY C - 10/11/2019 13:14 EST  HORSCHEL, PT, TIFFANY C - 10/11/2019 13:14 EST       Plan   Evaluation Date :   10/11/2019 11:00 EST   PT Frequency Acute :   Daily   Duration :   30    PT Duration Unit Rehab :   Days   Treatments Planned :   Balance training, Bed mobility training, Functional training, Gait training, Therapeutic activities, Therapeutic exercises, Transfer training   Treatment Plan/Goals Established With Patient/Caregiver :   Yes   Other PT Treatment Provided :   pt will be seen 5-7 days/wk   Evaluation Complete :   Yes   HORSCHEL, PT, TIFFANY C - 10/11/2019 13:14 EST   Time Spent With Patient   PT Individual Eval Time, Moderate Complexity :   15 minutes   PT Evaluation Units, Moderate Complexity :   1 Unit   PT Treatment Time Comment :   PT eval, transfer training, sidestepping c RW, returned supine c bed alarm on.  Nsg updated.     PT Total Untimed Min :   15    PT Total Treatment Time Acute/OP :   15    HORSCHEL, PT, TIFFANY C - 10/11/2019 13:14 EST

## 2019-10-11 NOTE — Discharge Summary (Signed)
 ED Clinical Summary                     [facility]  [address]  [place], [place] [address]  [id number]          PERSON INFORMATION  Name: Jorge Allison, Jorge Allison Age:  73 Years DOB: 03/16/1946   Sex: Male Language: English PCP: PCP,  NONE   Marital Status: Single Phone: [phone] Med Service: MED-Medicine   MRN: [id number] Acct# [id number] Arrival: 10/11/2019 00:29:00   Visit Reason: Shortness of breath; SOB Acuity: 2 LOS: 000 04:13   Address:    [address] [place] [address]   Diagnosis:    Hypoxia; Pneumonia  Medications:          Medications that have not changed  Other Medications  apixaban (Eliquis 5 mg oral tablet) 1 Tabs Oral (given by mouth) 2 times a day.  Last Dose:____________________  aspirin (Aspir 81) Oral (given by mouth) every day.  Last Dose:____________________  atorvastatin (atorvastatin 80 mg oral tablet) 1 Tabs Oral (given by mouth) every day.  Last Dose:____________________  carvedilol (carvedilol 3.125 mg oral tablet) Oral (given by mouth) 2 times a day.  Last Dose:____________________  dronabinol (dronabinol 2.5 mg oral capsule) 1 Capsules Oral (given by mouth) 2 times a day.  Last Dose:____________________  ferrous sulfate (ferrous sulfate 325 mg (65 mg elemental iron) oral delayed release tablet) Oral (given by mouth) every day.  Last Dose:____________________  furosemide (Lasix 40 mg oral tablet) 1 Tabs Oral (given by mouth) every day.  Last Dose:____________________  potassium chloride (potassium chloride 20 mEq oral tablet, extended release) 1 Tabs Oral (given by mouth) every day.  Last Dose:____________________      Medications Administered During Visit:                Medication Dose Route   albuterol 720 mcg Inhale   iopamidol 100 mL IV Contrast   vancomycin 1 g IV Piggyback   cefepime 2 g IV Piggyback   methylPREDNISolone 125 mg IV Push               Allergies      No Known Medication Allergies      Major Tests and Procedures:  The following  procedures and tests were performed during your ED visit.  COMMON PROCEDURES%>  COMMON PROCEDURES COMMENTS%>                PROVIDER INFORMATION               Provider Role Assigned Sampson SAYRE, RN, [facility] ED Nurse 10/10/2019 21:51:37    RUPP-DO, ASBERRY KNEE ED Provider 10/10/2019 21:56:05    VANNIE, RN, [facility] ED Nurse 10/10/2019 23:00:07        Attending Physician:  LORINE RUSH D      Admit Doc  LORINE RUSH BIRCH     Consulting Doc  LORINE RUSH BIRCH     VITALS INFORMATION  Vital Sign Triage Latest   Temp Oral ORAL_1%> ORAL%>   Temp Temporal TEMPORAL_1%> TEMPORAL%>   Temp Intravascular INTRAVASCULAR_1%> INTRAVASCULAR%>   Temp Axillary AXILLARY_1%> AXILLARY%>   Temp Rectal RECTAL_1%> RECTAL%>   02 Sat 100 % 100 %   Respiratory Rate RATE_1%> RATE%>   Peripheral Pulse Rate PULSE RATE_1%>90 bpm PULSE RATE%>   Apical Heart Rate HEART RATE_1%> HEART RATE%>   Blood Pressure BLOOD PRESSURE_1%>/ BLOOD PRESSURE_1%>100 mmHg BLOOD PRESSURE%> / BLOOD  PRESSURE%>78 mmHg                 Immunizations      No Immunizations Documented This Visit          DISCHARGE INFORMATION   Discharge Disposition: S Outpt-Admitted As Inpatient   Discharge Location:  [facility]   Discharge Date and Time:     ED Checkout Date and Time:  10/11/2019 01:48:14     DEPART REASON INCOMPLETE INFORMATION             Problems      No Problems Documented              Smoking Status      Former smoker, quit more than 30 days ago         PATIENT EDUCATION INFORMATION  Instructions:          Follow up:            ED PROVIDER DOCUMENTATION     Patient:   Jorge Allison, Jorge Allison            MRN: [id number]            FIN: [id number]               Age:   73 years     Sex:  Male     DOB:  1946-04-29   Associated Diagnoses:   Pneumonia; Hypoxia   Author:   RUPP-DO,  ASBERRY KNEE      Basic Information   Time seen: Provider Seen (ST)   ED Provider/Time:    RUPP-DO,  REBECCA ASHLEY / 10/10/2019 21:56  .   Additional information: Chief  Complaint from Nursing Triage Note   Chief Complaint  Chief Complaint: pt resident at [facility] pt complained of SOB. staff reports pt o2 sat in 80's. pt uses O2NC @2L  prn. denied sx improvement. inicreased to NC4L with improvement (10/10/19 21:37:00).      History of Present Illness   The patient presents with difficulty breathing.  SOB for a few days. Wears 2 liters prn O2 at [facility]. Reports dry cough, SOB. Had to be placed on 4 liters NC. Denies chest pain, reports swelling in his legs are less than normal. .        Review of Systems   Constitutional symptoms:  No fever,    Respiratory symptoms:  Shortness of breath, no orthopnea, no cough.    Cardiovascular symptoms:  No chest pain,    Genitourinary symptoms:  No dysuria,              Additional review of systems information: All other systems reviewed and otherwise negative.      Health Status   Allergies:    Allergic Reactions (Selected)  No Known Medication Allergies.   Medications:  (Selected)   Documented Medications  Documented  Aspir 81: mg, Oral, Daily, 0 Refill(s)  Eliquis 5 mg oral tablet: 5 mg, 1 tabs, Oral, BID, 60 tabs, 0 Refill(s)  Lasix 40 mg oral tablet: 40 mg, 1 tabs, Oral, Daily, 0 Refill(s)  atorvastatin 80 mg oral tablet: 80 mg, 1 tabs, Oral, Daily, 0 Refill(s)  carvedilol 3.125 mg oral tablet: mg, tabs, Oral, BID, 0 Refill(s)  dronabinol 2.5 mg oral capsule: 2.5 mg, 1 caps, Oral, BID, 0 Refill(s)  ferrous sulfate 325 mg (65 mg elemental iron) oral delayed release tablet: mg, tabs, Oral, Daily, 0 Refill(s)  potassium chloride 20 mEq oral tablet, extended release: 20 mEq,  1 tabs, Oral, Daily, 0 Refill(s).      Past Medical/ Family/ Social History   Medical history: Reviewed as documented in chart.   Surgical history: Reviewed as documented in chart.   Family history: Not significant.   Social history: Reviewed as documented in chart.   Problem list:    Active Problems (3)  Atrial fibrillation   Heart failure   HTN (hypertension)   .      Physical  Examination               Vital Signs   Vital Signs   10/10/2019 22:42 EST Systolic Blood Pressure 108 mmHg     Diastolic Blood Pressure 71 mmHg     Peripheral Pulse Rate 93 bpm     Heart Rate Monitored 97 bpm     Respiratory Rate 17 br/min     SpO2 100 %    10/10/2019 21:59 EST Peripheral Pulse Rate 90 bpm (Modified)    Heart Rate Monitored 91 bpm     Respiratory Rate 12 br/min     SpO2 99 %    10/10/2019 21:37 EST Systolic Blood Pressure 115 mmHg     Diastolic Blood Pressure 100 mmHg  HI     Temperature Oral 37.3 degC     Heart Rate Monitored 90 bpm     Respiratory Rate 17 br/min     SpO2 100 %    .   Measurements   10/10/2019 21:40 EST Body Mass Index est meas 15.44 kg/m2   10/10/2019 21:40 EST Body Mass Index Measured 15.44 kg/m2   10/10/2019 21:37 EST Height/Length Measured 187 cm    Weight Dosing 54 kg   .   Basic Oxygen Information   10/10/2019 22:42 EST Oxygen Flow Rate 3 L/min    SpO2 100 %   10/10/2019 21:59 EST Oxygen Therapy Nasal  cannula    Oxygen Flow Rate 4 L/min    SpO2 99 %   10/10/2019 21:37 EST Oxygen Therapy Nasal  cannula    Oxygen Flow Rate 4 L/min    SpO2 100 %   .   General:  Alert, no acute distress.    Skin:  Warm, dry, pink.    Eye:  Pupils are equal, round and reactive to light, extraocular movements are intact, normal conjunctiva.    Neck:  Supple, trachea midline, no tenderness, no JVD.    Cardiovascular:  Regular rate and rhythm, No murmur, Normal peripheral perfusion, No edema.    Respiratory:  Respirations: Tachypneic, Breath sounds: Bilateral, posterior, diminished, rhonchi present, Cough: Moderate.    Gastrointestinal:  Soft, Nontender, Non distended.    Back:  Nontender, Normal range of motion, Normal alignment.    Musculoskeletal:  Normal ROM, normal strength, no tenderness, no swelling.    Neurological:  Alert and oriented to person, place, time, and situation, No focal neurological deficit observed, CN II-XII intact, normal sensory observed, normal motor observed, normal  speech observed, normal coordination observed.    Psychiatric:  Cooperative.      Medical Decision Making   Documents reviewed:  Emergency department nurses' notes.   Electrocardiogram:  Emergency Provider interpretation performed by me, See ECG ED Review.    Results review:  Lab results : Lab View   10/10/2019 22:15 EST SARS-CoV-2 (GX) Not Detected    Influenza A (GX) Not Detected    Influenza B (GX) Not Detected    RSV (GX) Not Detected   10/10/2019 22:13 EST Estimated Creatinine Clearance 72.86  mL/min   10/10/2019 22:08 EST Lactic Acid Lvl 1.8 mmol/L   10/10/2019 21:51 EST WBC 6.1 x10e3/mcL    RBC 4.70 x10e6/mcL    Hgb 11.0 g/dL  LOW    HCT 62.3 %  LOW    MCV 80.0 fL  LOW    MCH 23.4 pg  LOW    MCHC 29.3 g/dL  LOW    RDW 74.3 %  HI    Platelet 137 x10e3/mcL  LOW    MPV 9.8 fL    Neutro Auto 74.0 %    Neutro Absolute 4.5 x10e3/mcL    Immature Grans Percent 0.3 %    Immature Grans Absolute 0.02 x10e3/mcL    Lymph Auto 15.2 %    Lymph Absolute 0.9 x10e3/mcL  LOW    Mono Auto 8.5 %    Mono Absolute 0.5 x10e3/mcL    Eosinophil Percent 1.3 %    Eos Absolute 0.1 x10e3/mcL    Basophil Auto 0.7 %    Baso Absolute 0.0 x10e3/mcL    NRBC Absolute Auto 0.000 x10e3/mcL    NRBC Percent Auto 0.0 %    Sodium Lvl 136 mmol/L    Potassium Lvl 4.6 mmol/L    Chloride 98 mmol/L    CO2 28 mmol/L    Glucose Random 99 mg/dL    BUN 28 mg/dL  HI    Creatinine Lvl 0.7 mg/dL    AGAP 10 mmol/L    Osmolality Calc 277 mOsm/kg    Calcium Lvl 9.0 mg/dL    eGFR AA 890 fO/fpw/8.26f    eGFR Non-AA 94 mL/min/1.4m    Trop T Quant 0.065 ng/mL  CRIT    Trop 5th Gen Study 88.0 ng/L  HI    NTproBNP 5,607 pg/mL  HI   .   Radiology results:  Rad Results (ST)   No qualifying data available..   Notes:  CTA bilateral pleural effusion, PNA.      Reexamination/ Reevaluation   Vital signs   Basic Oxygen Information   10/10/2019 22:42 EST Oxygen Flow Rate 3 L/min    SpO2 100 %   10/10/2019 21:59 EST Oxygen Therapy Nasal  cannula    Oxygen Flow Rate 4 L/min     SpO2 99 %   10/10/2019 21:37 EST Oxygen Therapy Nasal  cannula    Oxygen Flow Rate 4 L/min    SpO2 100 %         Impression and Plan   Diagnosis   Pneumonia (ICD10-CM J18.9, Discharge, Medical)   Hypoxia (ICD10-CM R09.02, Discharge, Medical)      Calls-Consults   -  10/11/2019 00:28:00 , LORINE RUSH D, recommends Inpt tele.    Plan   Condition: Stable.    Disposition: Admit time  10/11/2019 00:28:00, Admit to Inpatient Telemetry Unit.    Counseled: Patient, Regarding diagnosis, Regarding diagnostic results, Regarding treatment plan, Patient indicated understanding of instructions.

## 2019-10-11 NOTE — Case Communication (Signed)
CM D/C Planning Assessment Ongoing- Text       CM Admission Assessment Entered On:  10/11/2019 17:19 EST    Performed On:  10/11/2019 17:18 EST by Barnetta Chapel               CM Admission Assessment   CM Reason for Care Management Referral :   Discharge planning assessment   CM Insurance Information :   Medicare   CM Insurance Co. Name :   Medicare Part A Only   CM Date and Time Inpatient Order :   10/11/2019 0:30 EST   CM Date and Time Admission IM :   10/11/2019 0:45 EST   CM Date of 3rd MN Occurrence :   10/14/2019 EST   CM Home/Lay Caregiver Name/Relationship :   Cheryll Dessert, daughter   CM Home/Lay Caregiver Contact Number :   [phone]   CM Living Situation :   [place] Patient Admitted From :   From [place] and Treatments :   Wheelchair   CM Initial Tentative Discharge Plan :   Return to SNF   Anticipated Hosp Related Barriers to DC :   None identified   CM Home Barriers :   None   CM Progress Note :   10/11/19 GG - 73 yo male IPC with bilateral PNA with hypoxia, severe MR, and severe PCM. From [facility] SNF after d/c from [facility] on 12/4 for CHF. Pt has been on O2 since last hospitalization. History of aortic stenosis s/p bioprosthetic aortic valve replacement and mitral valve repair approximately [facility] ago [facility] without follow-up, atrial fibrillation, CAD, HTN, chronic leg ulcers, pulmonary hypertension. Pt's readmission risk is low (32). Medicare IM reviewed today at admission. Pt to return to [facility] SNF at d/c. Case Management to follow.       CM Admission Assessment Complete :   Yes   Barnetta Chapel - 10/11/2019 17:18 EST

## 2019-10-11 NOTE — ED Notes (Signed)
ED Patient Education Note     Patient Education Materials Follows:

## 2019-10-11 NOTE — Progress Notes (Signed)
 Dietary Progress Note                 NUTRITION ASSESSMENT: MST-4 c/s, PNA MD c/s, BMI <18.5 screen-remote assessment  ASSESSMENT: 39 yoM admitted from [facility] (at [facility] after 2-week admission to r-sided HF at [facility]).   At [facility] was considered for TAVR but not deemed good surgical candidate -goal for rehab was for increased strength for surgery.  Admitted to [facility] with admitting diagnosis here at [facility] are b/l PNA, CAD, HF, and severe malnutrition.  On 2L O2 via NC.  PO diet in place. Spoke with patient via phone.  He reports eating pancakes, coffee, and juice for breakfast.  75% breakfast intake recorded by staff in EMR.  No report N/V.  No reported ABD pain.  Patient does wear dentures-requesting denture cream-RN alerted.  Is tolerating eating/chewing, however.  Patient is receptive to ONS.    PMH: afib, HTN, HTN, CAD, celiac artery aneurysm, MR, pulmonary HTN  FOOD ALLERGIES: NKFA  LABS: Reviewed  RELEVANT MEDS: Scheduled: amoxicillin, atorvastatin, doxycycline hyclate, Marinol, ferrous sulfate, Lasix, MVI, KCl  SKIN: erythema to sacrum, PI to b/l ankles-no WOCN c/s noted  DIET Cardiac  OUTPUT: making urine (volume not always recorded), BMx1 (12/26)  SOCIAL, CULTURAL, OR RELGIOUS CONSIDERATIONS: lives alone when at home in [place]  HT: 187 cm?? WT: 54 kg? BMI: 15.4-underweight?   WT HX PTA: UBW 200 lbs/90.9 kg-patient reporting he weight this 5 months ago-40% unintentional weight loss over 5 months-severe-will trend for accuracy  PO HX PTA: PO poor while at [facility] (<50% usual intake).  Patient reporting he does better with PO at home, but there it is fair.  Has been taking 1 chocolate protein drink (he was unsure of exact drink)/day.  Takes a daily MVI at home.   NUTRITION FOCUSED PHYSICAL EXAM (NFPE): deferred  DIAGNOSIS: Severe malnutrition in context of chronic illness-related to inadequate oral intake, ongoing HF-as evidenced by weight loss >7.5% over past 3 months, PO <75% estimated needs x >1  month; BMI <18.5, underweight  Goals: 1-intake of at least 75% estimated needs; 2-promotion of weight gain ??  EER: 1620-1890 kcal (30-35 kcal/kg)???????? EPR: 81-108 g PRO (1.5-2 g/kg)   CHO: 202-236 g carbs (50% EER)?????? FLUID: 1 mL/kcal or per MD    INTERVENTION:   -continue PO diet as ordered-consider removal of cardiac restriction if medically appropriate in effort to promote PO   -Cardiac diet provides about 1939 kcal, 110 g PRO/day   2. Ordered Ensure Enlive TID to supplement intake, encourage weight gain (1050 kcal, 60 g PRO)-chocolate flavor only   3. Ordered phosphorus for AM-baseline trending and poor PO PTA noted -please supplement PRN   4. Ordered vitamin D for AM-please supplement PRN   5. Ordered vitamin C to compliment ferrous sulfate order   6. Ordered daily MVI to supplement micronutrient intake 2/2 noted severe malnutrition   7. NFPE needed at f/u   8. Consider WOCN c/s PRN 2/2 noted skin breakdown  Discharge Planning: Available prn for questions/concerns regarding nutrition plan for discharge.   -High Protein/High Calorie diet-will ask RD to f/u to provide ONS samples for patient to take home post d/c   -Myplate: [employer] written diet education material attached to d/c info    MONITORING/EVALUATION: PO intake, ONS intake, labs, skin integrity, weight trends, POC. F/u within 6 days, consult RD prn.  Rollene Seeds, MS, RD, LD, CNSC  [phone]  Office:[phone]  Weekend On Call RD-Telemediq: [facility]  Clinical Nutrition     Signature Line     Electronically Signed on 10/11/2019 12:10 PM EST   ________________________________________________   Ethlyn Quant

## 2019-10-11 NOTE — ED Notes (Signed)
 ED Patient Summary              [facility] Emergency Department  [address] [place] [address]  [phone]  Discharge Instructions (Patient)  _______________________________________     Name: NASEEM, VARDEN  DOB: 08-07-1946                   MRN: [id number]                   FIN: [id number]  Reason For Visit: Shortness of breath; SOB  Final Diagnosis: Hypoxia; Pneumonia     Visit Date: 10/10/2019 21:35:00  Address: [address] [place] [address]  Phone: [phone]     Emergency Department Providers:         Primary Physician:   RUPP-DO, REBECCA ASHLEY           [facility] would like to thank you for allowing us  to assist you with your healthcare needs. The following includes patient education materials and information regarding your injury/illness.     Follow-up Instructions:  You were seen today on an emergency basis. Please contact your primary care doctor for a follow up appointment. If you received a referral to a specialist doctor, it is important you follow-up as instructed.    It is important that you call your follow-up doctor to schedule and confirm the location of your next appointment. Your doctor may practice at multiple locations. The office location of your follow-up appointment may be different to the one written on your discharge instructions.    If you do not have a primary care doctor, please call [phone] for help in finding a [facility]ie [facility]is[facility] Provider. For help in finding a specialist doctor, please call [phone].    The [facility] "Ask a Nurse" line in staffed by Registered Nurses and is a free service to the community. We are available Monday - Friday from 8am to 5pm to answer your questions about your health. Please call [phone].    If your condition gets worse before your follow-up with your primary care doctor or specialist, please return to the Emergency Department.        Follow Up  Appointments:  Primary Care Provider:      Name: PCP,  NONE      Phone:           Printed Prescriptions:    Patient Education Materials:  Discharge Orders       Comment:             Allergy Info: No Known Medication Allergies     Medication Information:  [facility] ED Physicians provided you with a complete list of medications post discharge, if you have been instructed to stop taking a medication please ensure you also follow up with this information to your Primary Care Physician.  Unless otherwise noted, patient will continue to take medications as prescribed prior to the Emergency Room visit.  Any specific questions regarding your chronic medications and dosages should be discussed with your physician(s) and pharmacist.          apixaban (Eliquis 5 mg oral tablet) 1 Tabs Oral (given by mouth) 2 times a day.  aspirin (Aspir 81) Oral (given by mouth) every day.  atorvastatin (atorvastatin 80 mg oral tablet) 1 Tabs Oral (given by mouth) every day.  carvedilol (carvedilol 3.125 mg oral tablet) Oral (given by mouth) 2 times  a day.  dronabinol (dronabinol 2.5 mg oral capsule) 1 Capsules Oral (given by mouth) 2 times a day.  ferrous sulfate (ferrous sulfate 325 mg (65 mg elemental iron) oral delayed release tablet) Oral (given by mouth) every day.  furosemide (Lasix 40 mg oral tablet) 1 Tabs Oral (given by mouth) every day.  potassium chloride (potassium chloride 20 mEq oral tablet, extended release) 1 Tabs Oral (given by mouth) every day.      Medications Administered During Visit:              Medication Dose Route   albuterol 720 mcg Inhale   iopamidol 100 mL IV Contrast   vancomycin 1 g IV Piggyback   cefepime 2 g IV Piggyback   methylPREDNISolone 125 mg IV Push          Major Tests and Procedures:  The following procedures and tests were performed during your ED visit.  COMMON PROCEDURES%>  COMMON PROCEDURES COMMENTS%>          Laboratory Orders  Name Status Details   .Trop 5th Gen Completed Blood, Stat,  ST - Stat, Collected, 10/10/19 21:51:00 EST, 10/10/19 21:51:00 EST, Nurse collect, 10/10/19 21:51:00 EST, PLOTKIN, RN, EDSEL POUR, [id number]   BMP Completed Blood, Stat, ST - Stat, 10/10/19 21:49:00 EST, 10/10/19 21:49:00 EST, Nurse collect, PLOTKIN, RN, DANIELLE K, Print label Y/N   C Blood Ordered Blood, Peripheral, Stat, ST - Stat, 10/10/19 22:04:00 EST, Once, 10/10/19 22:32:00 EST, Nurse collect, rfa, 2 of 2   C Blood Ordered Blood, Peripheral, Stat, ST - Stat, 10/10/19 22:04:00 EST, Once, 10/10/19 22:13:00 EST, Nurse collect, lfa, 1 of 2   CBCDIFF Completed Blood, Stat, ST - Stat, 10/10/19 21:49:00 EST, 10/10/19 21:49:00 EST, Nurse collect, PLOTKIN, RN, DANIELLE K, Print label Y/N   GX Multi Completed Nasopharyngeal Swab, Stat, ST - Stat, 10/10/19 21:56:00 EST, Once, 10/10/19 21:56:00 EST, Nurse collect, RUPP-DO,  REBECCA ASHLEY, Print label Y/N   Lactic Completed Blood, Stat, ST - Stat, 10/10/19 22:04:00 EST, 10/10/19 22:04:00 EST, Nurse collect, RUPP-DO,  REBECCA ASHLEY, Print label Y/N   NTproBNP Completed Blood, Stat, ST - Stat, 10/10/19 21:49:00 EST, 10/10/19 21:49:00 EST, Nurse collect, PLOTKIN, RN, EDSEL K, Print label Y/N   Trop T Completed Blood, Stat, ST - Stat, 10/10/19 21:49:00 EST, 10/10/19 21:49:00 EST, Nurse collect, PLOTKIN, RN, EDSEL K, Print label Y/N   XTube Blue Completed Blood, Stat, ST - Stat, Collected, 10/10/19 21:51:00 EST [id number], 10/10/19 21:51:00 EST, Nurse collect, Venous Draw, 10/10/19 21:54:00 EST, SF CP Login, PLOTKIN, RN, DANIELLE K, Print label Y/N, sf_laboratory_1, 1.8 mL Aolz/*[id number]*/, Complete   XTube SST Completed Blood, Stat, ST - Stat, Collected, 10/10/19 21:51:00 EST [id number], 10/10/19 21:51:00 EST, Nurse collect, Venous Draw, 10/10/19 21:54:00 EST, SF CP Login, PLOTKIN, RN, DANIELLE K, Print label Y/N, sf_laboratory_1, 4 mL DDU/*[id number]*/, Complete               Radiology Orders  Name Status Details   CT Angiography Chest w/ + w/o Contrast Ordered  10/10/19 22:53:00 EST, STAT 1 hour or less, Reason: PE suspected, high pretest prob, Transport Mode: STRETCHER, Rad Type, pp_set_radiology_subspecialty   XR Chest 1 View Portable Ordered 10/10/19 21:49:00 EST, STAT 1 hour or less, Reason: Shortness of breath  (SOB), Transport Mode: Portable, pp_set_radiology_subspecialty               Patient Care Orders  Name Status Details   Admission History Adult Ordered 10/11/19 1:45:58 EST, 10/11/19 1:45:58 EST   Adult Admission Rule  Ordered    Bed Request Communication Ordered 10/11/19 0:29:00 EST Med Tele, PNA, Hypoxia   COVID-19 Status Ordered 10/11/19 1:45:58 EST, NOT VALID FOR pharmacy, laboratory, radiology., 10/11/19 1:45:58 EST, COVID-19 Not Detected   Cardiac/NIBP/Pulse Ox Monitoring Completed 10/10/19 21:49:00 EST, This message can only be seen by Nursing, it is not visible to Pharmacy, Laboratory, or Radiology., 10/10/19 21:49:00 EST, 10/10/19 21:49:00 EST, Once   Change attending to Ordered 10/11/19 0:29:00 EST, LORINE NORLEEN BIRCH   DC ISO Order/Icons Ordered 10/10/19 21:36:07 EST, 10/10/19 21:36:07 EST   ED Assessment Adult Completed 10/10/19 21:40:34 EST, 10/10/19 21:40:34 EST   ED Only Oxygen Therapy Completed 10/10/19 21:49:00 EST, 10/10/19 21:49:00 EST, Keep SAT > 92%   ED Secondary Triage Completed 10/10/19 21:40:34 EST, 10/10/19 21:40:34 EST   ED Triage Adult Completed 10/10/19 21:36:06 EST, 10/10/19 21:36:06 EST   Immunizations Quality Measures Ordered 10/11/19 1:45:58 EST, 10/11/19 1:45:58 EST   Notify MD/APP Completed 10/10/19 21:49:00 EST, If O2 is needed, 10/10/19 21:49:00 EST, 10/10/19 21:49:00 EST, Once   Patient Specific Fall Safety Measures Completed 10/10/19 21:44:35 EST, Once, 10/10/19 21:44:35 EST, ED Low Fall Risk Documentation   Place Isolation Order Ordered 10/11/19 1:45:58 EST, 10/11/19 1:45:58 EST   Saline Lock Insert Completed 10/10/19 21:49:00 EST, Once, 10/10/19 21:49:00 EST, AC site preferred   VTE Quality Measures Ordered 10/11/19  0:30:12 EST, 10/11/19 0:30:12 EST       ---------------------------------------------------------------------------------------------------------------------  [facility]) encourages you to self-enroll in the [facility] Patient Portal.  [facility] Patient Portal will allow you to manage your personal health information securely from your own electronic device now and in the future.  To begin your Patient Portal enrollment process, please visit [email or web address] Click on "Sign up now" under [facility].  If you find that you need additional assistance on the [facility] Patient Portal or need a copy of your medical records, please call the [facility] Medical Records Office at [phone].  Comment:

## 2019-10-11 NOTE — ED Notes (Signed)
ED Note-Nursing       ED RN Reassessment Entered On:  10/11/2019 1:47 EST    Performed On:  10/11/2019 1:30 EST by Dan Humphreys, RN, [facility]               ED RN Reassessment   ED RN Progress Note :   REPOIRT TO NURSE ASSUMING CARE OF PT.   Dan Humphreys, RN, [facility] - 10/11/2019 1:47 EST

## 2019-10-11 NOTE — ED Notes (Signed)
ED Note-Nursing       ED RN Reassessment Entered On:  10/11/2019 0:48 EST    Performed On:  10/11/2019 0:47 EST by Dan Humphreys, RN, [facility]               ED RN Reassessment   ED RN Progress Note :   HOSPITALIST IN TO SEE PT.--STABLE WITHOUT DISTRESS--NO SOB OR RESP. DIFF OR DISTRESS   Dan Humphreys RN, [facility] - 10/11/2019 0:47 EST

## 2019-10-11 NOTE — ED Notes (Signed)
ED Note-Nursing       ED RN Reassessment Entered On:  10/11/2019 1:01 EST    Performed On:  10/11/2019 0:59 EST by Dan Humphreys, RN, [facility]               ED RN Reassessment   ED RN Progress Note :   STABLE WITHOUT DISTRESS--+ SLIGHT CONFUSION--NO SOB OR RESP. DIFF OR DISTRESS--FALLS ABNDS AND SOCKS PLACED--A-FIB WITH PVC's--UPDATED ON TREATMENT PLAN AND STATUS   Dan Humphreys RN, [facility] - 10/11/2019 0:59 EST

## 2019-10-12 LAB — CBC WITH AUTO DIFFERENTIAL
Absolute Baso #: 0 10*3/uL (ref 0.0–0.2)
Absolute Eos #: 0 10*3/uL (ref 0.0–0.5)
Absolute Lymph #: 1 10*3/uL (ref 1.0–3.2)
Absolute Mono #: 0.5 10*3/uL (ref 0.3–1.0)
Basophils %: 0.4 % (ref 0.0–2.0)
Eosinophils %: 0.4 % (ref 0.0–7.0)
Hematocrit: 29.4 % — ABNORMAL LOW (ref 38.0–52.0)
Hemoglobin: 8.6 g/dL — ABNORMAL LOW (ref 13.0–17.3)
Immature Grans (Abs): 0.01 10*3/uL (ref 0.00–0.06)
Immature Granulocytes: 0.2 % (ref 0.1–0.6)
Lymphocytes: 18.7 % (ref 15.0–45.0)
MCH: 23.2 pg — ABNORMAL LOW (ref 27.0–34.5)
MCHC: 29.3 g/dL — ABNORMAL LOW (ref 32.0–36.0)
MCV: 79.5 fL — ABNORMAL LOW (ref 84.0–100.0)
MPV: 10.4 fL (ref 7.2–13.2)
Monocytes: 9.4 % (ref 4.0–12.0)
NRBC Absolute: 0 10*3/uL (ref 0.000–0.012)
NRBC Automated: 0 % (ref 0.0–0.2)
Neutrophils %: 70.9 % (ref 42.0–74.0)
Neutrophils Absolute: 4 10*3/uL (ref 1.6–7.3)
Platelets: 122 10*3/uL — ABNORMAL LOW (ref 140–440)
RBC: 3.7 x10e6/mcL — ABNORMAL LOW (ref 4.00–5.60)
RDW: 25.5 % — ABNORMAL HIGH (ref 11.0–16.0)
WBC: 5.6 10*3/uL (ref 3.8–10.6)

## 2019-10-12 LAB — CULTURE, MRSA, SURVEILLANCE

## 2019-10-12 LAB — BASIC METABOLIC PANEL
Anion Gap: 7 mmol/L (ref 2–17)
BUN: 28 mg/dL — ABNORMAL HIGH (ref 8–23)
CO2: 29 mmol/L (ref 22–29)
Calcium: 8.7 mg/dL — ABNORMAL LOW (ref 8.8–10.2)
Chloride: 100 mmol/L (ref 98–107)
Creatinine: 0.6 mg/dL — ABNORMAL LOW (ref 0.7–1.3)
GFR African American: 116 mL/min/{1.73_m2} (ref >=90)
GFR Non-African American: 100 mL/min/{1.73_m2} (ref >=90)
Glucose: 87 mg/dL (ref 70–99)
OSMOLALITY CALCULATED: 277 mOsm/kg (ref 270–287)
Potassium: 4.3 mmol/L (ref 3.5–5.3)
Sodium: 136 mmol/L (ref 135–145)

## 2019-10-12 LAB — MORPHOLOGY CHECK
Platelet Estimate: DECREASED — AB
RBC Morphology: ABNORMAL — AB

## 2019-10-12 LAB — PHOSPHORUS: Phosphorus: 3.4 mg/dL (ref 2.5–4.5)

## 2019-10-13 LAB — CBC WITH AUTO DIFFERENTIAL
Absolute Baso #: 0 10*3/uL (ref 0.0–0.2)
Absolute Eos #: 0.1 10*3/uL (ref 0.0–0.5)
Absolute Lymph #: 0.9 10*3/uL — ABNORMAL LOW (ref 1.0–3.2)
Absolute Mono #: 0.4 10*3/uL (ref 0.3–1.0)
Basophils %: 0.6 % (ref 0.0–2.0)
Eosinophils %: 3 % (ref 0.0–7.0)
Hematocrit: 30.2 % — ABNORMAL LOW (ref 38.0–52.0)
Hemoglobin: 8.8 g/dL — ABNORMAL LOW (ref 13.0–17.3)
Immature Grans (Abs): 0.01 10*3/uL (ref 0.00–0.06)
Immature Granulocytes: 0.2 % (ref 0.1–0.6)
Lymphocytes: 19.9 % (ref 15.0–45.0)
MCH: 23.8 pg — ABNORMAL LOW (ref 27.0–34.5)
MCHC: 29.1 g/dL — ABNORMAL LOW (ref 32.0–36.0)
MCV: 81.6 fL — ABNORMAL LOW (ref 84.0–100.0)
MPV: 10.3 fL (ref 7.2–13.2)
Monocytes: 8.9 % (ref 4.0–12.0)
NRBC Absolute: 0 10*3/uL (ref 0.000–0.012)
NRBC Automated: 0 % (ref 0.0–0.2)
Neutrophils %: 67.4 % (ref 42.0–74.0)
Neutrophils Absolute: 3.1 10*3/uL (ref 1.6–7.3)
Platelets: 133 10*3/uL — ABNORMAL LOW (ref 140–440)
RBC: 3.7 x10e6/mcL — ABNORMAL LOW (ref 4.00–5.60)
RDW: 26.1 % — ABNORMAL HIGH (ref 11.0–16.0)
WBC: 4.6 10*3/uL (ref 3.8–10.6)

## 2019-10-13 LAB — MORPHOLOGY CHECK
Platelet Estimate: DECREASED — AB
RBC Morphology: ABNORMAL — AB

## 2019-10-13 LAB — BASIC METABOLIC PANEL
Anion Gap: 7 mmol/L (ref 2–17)
BUN: 27 mg/dL — ABNORMAL HIGH (ref 8–23)
CO2: 29 mmol/L (ref 22–29)
Calcium: 8.7 mg/dL — ABNORMAL LOW (ref 8.8–10.2)
Chloride: 100 mmol/L (ref 98–107)
Creatinine: 0.6 mg/dL — ABNORMAL LOW (ref 0.7–1.3)
GFR African American: 116 mL/min/{1.73_m2} (ref >=90)
GFR Non-African American: 100 mL/min/{1.73_m2} (ref >=90)
Glucose: 91 mg/dL (ref 70–99)
OSMOLALITY CALCULATED: 276 mOsm/kg (ref 270–287)
Potassium: 4.2 mmol/L (ref 3.5–5.3)
Sodium: 136 mmol/L (ref 135–145)

## 2019-10-13 LAB — VITAMIN D 25 HYDROXY: Vit D, 25-Hydroxy: 61.2 ng/mL (ref 30.0–90.0)

## 2019-10-13 NOTE — Progress Notes (Signed)
Chaplaincy Note - Text       Chaplaincy Note Entered On:  10/13/2019 9:34 EST    Performed On:  10/13/2019 9:34 EST by Jena Gauss,  LEON               Chaplaincy Consult   Faith/Denomination :   [facility]   Additional Information, Chaplaincy :   I attempted to visit with the Pt but he declined my offer to visit with Him. I respected the Pt's desire and exit the room.    Hezzie Bump - 10/13/2019 9:34 EST

## 2019-10-13 NOTE — Case Communication (Signed)
CM Discharge Planning Assessment - Text       CM Progress Note Entered On:  10/13/2019 13:45 EST    Performed On:  10/13/2019 13:43 EST by Kathi Simpers B               CM Progress Note   CM Home/Lay Caregiver Name/Relationship :   Osmin Welz, daughter   CM Home/Lay Caregiver Contact Number :   [phone]   CM Initial Tentative Discharge Plan :   Return to SNF   CM Progress Note :   10/11/19 GG - 73 yo male IPC with bilateral PNA with hypoxia, severe MR, and severe PCM. From [facility] SNF after d/c from [facility] on 12/4 for CHF. Pt has been on O2 since last hospitalization. History of aortic stenosis s/p bioprosthetic aortic valve replacement and mitral valve repair approximately [facility], atrial fibrillation, CAD, HTN, chronic leg ulcers, pulmonary hypertension. Pt's readmission risk is low (32). Medicare IM reviewed today at admission. Pt to return to [facility] SNF at d/c. Case Management to follow.  12/27 Pt has refused to return to [facility] and dtr can not take care of pt 24/7.  Pt wants another SNF.  List provided and he chose [facility].  Dtr consented by phone for a Referral to be sent and MD notified bk        Marena Chancy - 10/13/2019 13:43 EST

## 2019-10-13 NOTE — Progress Notes (Signed)
 Inpatient PT Daily Documentation - Text       Inpatient PT Daily Documentation Entered On:  10/13/2019 15:56 EST    Performed On:  10/13/2019 15:53 EST by CLAUDENE, PTA, LAUREN D               Reason for Treatment   *Reason for Referral :   Pt admitted c B pneumonia and SOB.  Pt has long cardiac history and has been at [facility] since d/c from [facility] earlier in month.    Precautions: 2L O2, condom catheter     *Chief Complaint :   dizziness when sitting EOB.      PTA Visit Acute :   #1   CLAUDENE JOSETTA MAXWELL D - 10/13/2019 15:53 EST   Pain Assessment   Pain Present :   No actual or suspected pain   CLAUDENE JOSETTA MAXWELL D - 10/13/2019 15:53 EST   Mobility   Mobility Grid   Supine to Sit :   Rehab Supervision   Sit to Supine :   Rehab Supervision   Edge of Bed Assist :   Rehab Supervision   CLAUDENE JOSETTA MAXWELL JONETTA - 10/13/2019 15:53 EST   Assessment   PT Impairments or Limitations :   Ambulation deficits, Balance deficits, Bed mobility deficits, Range of motion deficits, Strength deficits, Transfer deficits   Discharge Recommendations :   Recommend return to SNF     D/CTransportation Recommendations :   No stretcher   D/C Transportation Recommendations Reviewed :   Yes   PT Treatment Recommendations :   Began with LE therex including heel slides, SAQ's, hip abduction, ankle pumps, and manually resisted hip abd and pillow squeezes for hip add x 10- 20 of all. He then sat EOB, but c/o dizziness after performing ~20 LAQ's and returned to supine. Dizziness immediately subsided. Plan to continue OOB activity as tolerated.      CLAUDENE JOSETTA MAXWELL D - 10/13/2019 15:53 EST   Time Spent With Patient   PT Functional Training Time :   15 minutes   PTA Functional Training Units :   1 units   PT Total Timed Code Treatment Units :   1 units   PT Total Timed Code Min :   15    PT Total Treatment Time Acute/OP :   [address] D - 10/13/2019 15:53 EST

## 2019-10-14 LAB — BASIC METABOLIC PANEL
Anion Gap: 7 mmol/L (ref 2–17)
BUN: 24 mg/dL — ABNORMAL HIGH (ref 8–23)
CO2: 30 mmol/L — ABNORMAL HIGH (ref 22–29)
Calcium: 8.8 mg/dL (ref 8.8–10.2)
Chloride: 101 mmol/L (ref 98–107)
Creatinine: 0.6 mg/dL — ABNORMAL LOW (ref 0.7–1.3)
GFR African American: 116 mL/min/{1.73_m2} (ref >=90)
GFR Non-African American: 100 mL/min/{1.73_m2} (ref >=90)
Glucose: 113 mg/dL — ABNORMAL HIGH (ref 70–99)
OSMOLALITY CALCULATED: 280 mOsm/kg (ref 270–287)
Potassium: 4.5 mmol/L (ref 3.5–5.3)
Sodium: 138 mmol/L (ref 135–145)

## 2019-10-14 LAB — CBC WITH AUTO DIFFERENTIAL
Absolute Baso #: 0.1 10*3/uL (ref 0.0–0.2)
Absolute Eos #: 0.1 10*3/uL (ref 0.0–0.5)
Absolute Lymph #: 0.9 10*3/uL — ABNORMAL LOW (ref 1.0–3.2)
Absolute Mono #: 0.4 10*3/uL (ref 0.3–1.0)
Basophils %: 1.6 % (ref 0.0–2.0)
Eosinophils %: 3.8 % (ref 0.0–7.0)
Hematocrit: 30.9 % — ABNORMAL LOW (ref 38.0–52.0)
Hemoglobin: 9.1 g/dL — ABNORMAL LOW (ref 13.0–17.3)
Immature Grans (Abs): 0.02 10*3/uL (ref 0.00–0.06)
Immature Granulocytes: 0.5 % (ref 0.1–0.6)
Lymphocytes: 24.7 % (ref 15.0–45.0)
MCH: 24.3 pg — ABNORMAL LOW (ref 27.0–34.5)
MCHC: 29.4 g/dL — ABNORMAL LOW (ref 32.0–36.0)
MCV: 82.6 fL — ABNORMAL LOW (ref 84.0–100.0)
MPV: 9.6 fL (ref 7.2–13.2)
Monocytes: 11 % (ref 4.0–12.0)
NRBC Absolute: 0 10*3/uL (ref 0.000–0.012)
NRBC Automated: 0 % (ref 0.0–0.2)
Neutrophils %: 58.4 % (ref 42.0–74.0)
Neutrophils Absolute: 2.1 10*3/uL (ref 1.6–7.3)
Platelets: 131 10*3/uL — ABNORMAL LOW (ref 140–440)
RBC: 3.74 x10e6/mcL — ABNORMAL LOW (ref 4.00–5.60)
RDW: 25.9 % — ABNORMAL HIGH (ref 11.0–16.0)
WBC: 3.7 10*3/uL — ABNORMAL LOW (ref 3.8–10.6)

## 2019-10-14 LAB — MORPHOLOGY CHECK
Platelet Estimate: DECREASED — AB
RBC Morphology: ABNORMAL — AB

## 2019-10-14 NOTE — Wound Image (Signed)
Wound Care Nurse Note               WOC RN Progress Note  Ankle Right - Skin Abnormality Type: Ulcer - Venous  Ankle Left - Skin Abnormality Type: Ulcer - Venous  Coccyx - Skin Abnormality Type: Pressure ulcer  Ankle Right - Skin Abnormality Color: Pink, Yellow  Ankle Left - Skin Abnormality Color: Pink  Coccyx - Skin Abnormality Color: Pink  Ankle Left - Pressure Point: Bony prominence  Coccyx - Pressure Point: Bony prominence  Ankle Right - Incision, Wound Dressing: Foam  Ankle Left - Incision, Wound Dressing: Foam  Coccyx - Incision, Wound Dressing: Foam  Ankle Right - Incision, Wound Dressing Assessment: Clean, Dry, Intact  Ankle Left - Incision, Wound Dressing Assessment: Clean, Dry, Intact  Ankle Right - Incision, Wound Dressing Activity: Assessed  Ankle Left - Incision, Wound Dressing Activity: Assessed  Coccyx - Incision, Wound Dressing Activity: Applied  Ankle Right - Wound Edge: Attached to wound bed  Ankle Left - Wound Edge: Attached to wound bed  Coccyx - Wound Edge: Attached to wound bed  Ankle Right - Incision, Wound Activity: Assessed  Ankle Left - Incision, Wound Activity: Assessed  Coccyx - Incision, Wound Activity: Assessed  Ankle Right - Incision, Wound Length: 2.5 cm  Coccyx - Incision, Wound Length: 0.2 cm  Ankle Right - Incision, Wound Width: 1 cm  Coccyx - Incision, Wound Width: 0.2 cm  Ankle Right - Incision, Wound Depth: 0.1 cm  Coccyx - Incision, Wound Depth: 0.1 cm  Ankle Right - Wound Bed Tissue Type: Epithelializing  Ankle Left - Wound Bed Tissue Type: Epithelializing  Coccyx - Wound Bed Tissue Type: Epithelializing  Coccyx - Incision, Wound Pressure Ulcer Stage: II  Coccyx - Pressure Ulcer Present On Admission: Yes  Coccyx - Pressure Ulcer Status WOC RN: Improving  Ankle Right - Wound Exudate Amount: Small  Ankle Left - Wound Exudate Amount: Small  Coccyx - Wound Exudate Amount: Small  Ankle Right - Wound Exudate Type: Serosanguineous  Ankle Left - Wound Exudate Type: Serous  Coccyx -  Wound Exudate Type: Serous  Ankle Right - Wound Exudate Odor: None  Ankle Left - Wound Exudate Odor: None  Coccyx - Wound Exudate Odor: None  Ankle Right - Incision,Wound Surrounding Tissue Color: Erythema  Ankle Left - Incision,Wound Surrounding Tissue Color: Erythema  Coccyx - Incision,Wound Surrounding Tissue Color: Erythema  Ankle Right - Incision, Wound Surrounding Tissue: Intact  Ankle Left - Incision, Wound Surrounding Tissue: Intact  Coccyx - Incision, Wound Surrounding Tissue: Intact  Ankle Right - Wound Status: Unchanged  Ankle Left - Wound Status: Unchanged  Ankle Right - Wound Associated Pain: With activity, mobilization  Ankle Left - Wound Associated Pain: With activity, mobilization  Coccyx - Wound Associated Pain: With activity, mobilization    stage II to sacrum, poa, improving with pinpoint open area, periwound is red and nonblanching, sacral foam dressing applied, condom cath in place  bilateral lower leg wounds, related to venous insufficiency, min serous drainage, border foam in place, no need to change at this time.    waffle mattress in place   [employer]

## 2019-10-14 NOTE — Case Communication (Signed)
CM Discharge Planning Assessment - Text       CM Progress Note Entered On:  10/14/2019 13:05 EST    Performed On:  10/14/2019 13:03 EST by Rosiland Oz B               CM Progress Note   CM Home/Lay Caregiver Name/Relationship :   Kester Stimpson, daughter   CM Home/Lay Caregiver Contact Number :   [phone]   CM Initial Tentative Discharge Plan :   Return to SNF   CM Progress Note :   10/11/19 GG - 73 yo male IPC with bilateral PNA with hypoxia, severe MR, and severe PCM. From [facility] SNF after d/c from [facility] on 12/4 for CHF. Pt has been on O2 since last hospitalization. History of aortic stenosis s/p bioprosthetic aortic valve replacement and mitral valve repair approximately [facility] ago at [facility] without follow-up, atrial fibrillation, CAD, HTN, chronic leg ulcers, pulmonary hypertension. Pt's readmission risk is low (32). Medicare IM reviewed today at admission. Pt to return to [facility] SNF at d/c. Case Management to follow.  12/27 Pt has refused to return to [facility] and dtr can not take care of pt 24/7.  Pt wants another SNF.  List provided and he chose [facility].  Dtr consented by phone for a Referral to be sent and MD notified bk   12/29 SNF referral sent to [facility] yesterday.  Ryan from [facility] pt will have to pay out of pocket for deductible and they will not have a bed until end of the week.  New referrals sent today proactively in hopes of placement.  [facility] is willing to take pt back but pt has refused again.  CM to follow [facility] RN CM        Rosiland Oz B - 10/14/2019 13:03 EST

## 2019-10-14 NOTE — Progress Notes (Signed)
 Inpatient PT Daily Documentation - Text       Inpatient PT Daily Documentation Entered On:  10/14/2019 13:21 EST    Performed On:  10/14/2019 13:11 EST by WARD, PTA, CHERYL H               Reason for Treatment   *Reason for Referral :   Pt admitted c B pneumonia and SOB.  Pt has long cardiac history and has been at [facility] SNF since d/c from [facility] earlier in month.    Precautions: 2L O2, condom catheter     *Chief Complaint :   dizziness when sitting EOB.      PTA Visit Acute :   pta #2 pt sup on arrival,on RA   WARD, PTA, CHERYL H - 10/14/2019 13:11 EST   Pain Assessment   Pain Present :   Yes actual or suspected pain   Pain Present :   Other: pt states everywhere   WARD, PTA, CHERYL H - 10/14/2019 13:11 EST   Mobility   Mobility Grid   Roll Left :   Rehab Contact guard assistance   Roll Right :   Rehab Contact guard assistance   Supine to Sit :   Rehab Minimal assistance   Sit to Supine :   Rehab Minimal assistance   WARD, PTA, CHERYL H - 10/14/2019 13:11 EST   Ambulation Level Acute :   Does not occur   Ambulation Quality :   pt stat6ed he was dizzy   WARD, PTA, CHERYL H - 10/14/2019 13:11 EST   Assessment   PT Impairments or Limitations :   Ambulation deficits, Balance deficits, Bed mobility deficits, Range of motion deficits, Strength deficits, Transfer deficits   Discharge Recommendations :   Recommend return to SNF     D/CTransportation Recommendations :   No stretcher   D/C Transportation Recommendations Reviewed :   Yes   PT Treatment Recommendations :   Pt agreeable c encouragement. Sup on arrival and stated pn everywhere. Condom cathetar came off c initiating bed mob. Pt then needing gown change and given washcloth to assist c cleaning. Pt then rolled side-side for pad change. Min A for sup-sit and sat static EOB. Pt then declined attempts at sit-stand transfer stating he was too dizzy. Min A to return sup. Time spent encouraging pt to participate to improve strength and overall endurance. Will cont to  progress as tol but anticipate pt needing to return to SNF to improve fxl mob.      WARD, PTA, CHERYL H - 10/14/2019 13:11 EST   Time Spent With Patient   PT Time In :   9:39 EST   PT Time Out :   10:02 EST   PT Treatment Time Comment :   sup; gown change, pt given washcloth to periclean; rolling side-side; sup-sit; sat EOB; static sitting; declined standing; sit-sup; time spent encouraging pt to participate more; sup, needs met; nrg aware     PT Functional Training Time :   23 minutes   PTA Functional Training Units :   2 units   PT Total Timed Code Treatment Units :   2 units   PT Total Timed Code Min :   23    PT Total Treatment Time Acute/OP :   23    WARD, PTA, CHERYL H - 10/14/2019 13:11 EST

## 2019-10-15 LAB — BASIC METABOLIC PANEL
Anion Gap: 7 mmol/L (ref 2–17)
BUN: 22 mg/dL (ref 8–23)
CO2: 30 mmol/L — ABNORMAL HIGH (ref 22–29)
Calcium: 8.8 mg/dL (ref 8.8–10.2)
Chloride: 97 mmol/L — ABNORMAL LOW (ref 98–107)
Creatinine: 0.6 mg/dL — ABNORMAL LOW (ref 0.7–1.3)
GFR African American: 116 mL/min/{1.73_m2} (ref >=90)
GFR Non-African American: 100 mL/min/{1.73_m2} (ref >=90)
Glucose: 105 mg/dL — ABNORMAL HIGH (ref 70–99)
Osmolaliy Calculated: 272 mosm/kg (ref 270–287)
Potassium: 4.2 mmol/L (ref 3.5–5.3)
Sodium: 134 mmol/L — ABNORMAL LOW (ref 135–145)

## 2019-10-15 LAB — MAGNESIUM: Magnesium: 2 mg/dL (ref 1.6–2.6)

## 2019-10-15 LAB — CBC
Hematocrit: 29.5 % — ABNORMAL LOW (ref 38.0–52.0)
Hemoglobin: 8.6 g/dL — ABNORMAL LOW (ref 13.0–17.3)
MCH: 23.9 pg — ABNORMAL LOW (ref 27.0–34.5)
MCHC: 29.2 g/dL — ABNORMAL LOW (ref 32.0–36.0)
MCV: 81.9 fL — ABNORMAL LOW (ref 84.0–100.0)
MPV: 9.4 fL (ref 7.2–13.2)
NRBC Absolute: 0 10*3/uL (ref 0.000–0.012)
NRBC Automated: 0 % (ref 0.0–0.2)
Platelets: 138 10*3/uL — ABNORMAL LOW (ref 140–440)
RBC: 3.6 x10e6/mcL — ABNORMAL LOW (ref 4.00–5.60)
RDW: 25.9 % — ABNORMAL HIGH (ref 11.0–16.0)
WBC: 3.6 10*3/uL — ABNORMAL LOW (ref 3.8–10.6)

## 2019-10-15 NOTE — Progress Notes (Signed)
PT Time Spent with Patient Acute/OP-Text       PT Time Spent With Patient Outpatient/Acute Entered On:  10/15/2019 11:08 EST    Performed On:  10/15/2019 11:08 EST by WARD, PTA, CHERYL H               Time Spent With Patient   PT Treatment Time Comment :   d/c to snf today, pt declined stating fatigued     PT Functional Training Time :   0 minutes   PT Total Timed Code Min :   0    PT Total Treatment Time Acute/OP :   0    WARD, PTA, CHERYL H - 10/15/2019 11:08 EST

## 2019-10-15 NOTE — Case Communication (Signed)
CM Discharge Planning Assessment - Text       CM Discharge Plan Entered On:  10/15/2019 10:57 EST    Performed On:  10/15/2019 10:56 EST by Marena Chancy               CM Discharge Plan   Discharge To :   Skilled Nursing Facility - Short Term   CM Discharge Services :   SNF   CM Discharge Service Agency Selected :   [facility]   Current Equipment and Treatments :   Wheelchair   CM Home/Lay Caregiver Name/Relationship :   Iskander Stuebe, daughter   [facility] Home/Lay Caregiver Contact Number :   [phone]   [facility] Services :   [place] [phone]     CM Date of 3rd MN Occurrence :   10/14/2019 0:00 EST   CM Progress Note :   10/11/19 GG - 73 yo male IPC with bilateral PNA with hypoxia, severe MR, and severe PCM. From [facility] SNF after d/c from [facility] on 12/4 for CHF. Pt has been on O2 since last hospitalization. History of aortic stenosis s/p bioprosthetic aortic valve replacement and mitral valve repair approximately [facility] ago [facility] without [facility]-up, atrial fibrillation, CAD, HTN, chronic leg ulcers, pulmonary hypertension. Pt's readmission risk is low (32). Medicare IM reviewed today at admission. Pt to return to [facility] SNF at d/c. Case Management to follow.  12/27 Pt has refused to return to [facility] and dtr can not take care of pt 24/7.  Pt wants another SNF.  List provided and he chose [facility].  Dtr consented by phone for a Referral to be sent and MD notified bk   12/29 SNF referral sent to [facility] yesterday.  Ryan from [facility] pt will have to pay out of pocket for deductible and they will not have a bed until end of the week.  New referrals sent today proactively in hopes of placement.  [facility] is willing to take pt back but pt has refused again.  CM to follow Genelle Bal RN CM   12/30 Pt is ready for dc to his choice of [facility].  IM on chart, transport arrange and dc packet sent to facility.  no current needs BK        Kathi Simpers B - 10/15/2019 10:56 EST

## 2019-10-16 LAB — CULTURE, BLOOD 1

## 2020-03-13 LAB — POC URINALYSIS, CHEMISTRY
Bilirubin, Urine, POC: NEGATIVE
Blood, UA POC: NEGATIVE
Glucose, UA POC: NEGATIVE mg/dL
Ketones, Urine, POC: NEGATIVE mg/dL
Leukocytes, UA: NEGATIVE
Nitrate, UA POC: NEGATIVE
Protein, Urine, POC: NEGATIVE
Specific Gravity, Urine, POC: 1.02 (ref 1.003–1.035)
UROBILIN U POC: 0.2 EU/dL
pH, Urine, POC: 6 (ref 4.5–8.0)

## 2020-03-13 LAB — CBC WITH AUTO DIFFERENTIAL
Absolute Baso #: 0.1 10*3/uL (ref 0.0–0.2)
Absolute Eos #: 0.1 10*3/uL (ref 0.0–0.5)
Absolute Lymph #: 0.8 10*3/uL — ABNORMAL LOW (ref 1.0–3.2)
Absolute Mono #: 0.8 10*3/uL (ref 0.3–1.0)
Basophils %: 0.6 % (ref 0.0–2.0)
Eosinophils %: 1 % (ref 0.0–7.0)
Hematocrit: 34.1 % — ABNORMAL LOW (ref 38.0–52.0)
Hemoglobin: 9.9 g/dL — ABNORMAL LOW (ref 13.0–17.3)
Immature Grans (Abs): 0.03 10*3/uL (ref 0.00–0.06)
Immature Granulocytes: 0.4 % (ref 0.1–0.6)
Lymphocytes: 9.4 % — ABNORMAL LOW (ref 15.0–45.0)
MCH: 24.1 pg — ABNORMAL LOW (ref 27.0–34.5)
MCHC: 29 g/dL — ABNORMAL LOW (ref 32.0–36.0)
MCV: 83.2 fL — ABNORMAL LOW (ref 84.0–100.0)
MPV: 8.8 fL (ref 7.2–13.2)
Monocytes: 9.9 % (ref 4.0–12.0)
NRBC Absolute: 0 10*3/uL (ref 0.000–0.012)
NRBC Automated: 0 % (ref 0.0–0.2)
Neutrophils %: 78.7 % — ABNORMAL HIGH (ref 42.0–74.0)
Neutrophils Absolute: 6.3 10*3/uL (ref 1.6–7.3)
Platelets: 164 10*3/uL (ref 140–440)
RBC: 4.1 x10e6/mcL (ref 4.00–5.60)
RDW: 19.1 % — ABNORMAL HIGH (ref 11.0–16.0)
WBC: 8.1 10*3/uL (ref 3.8–10.6)

## 2020-03-13 LAB — BASIC METABOLIC PANEL
Anion Gap: 9 mmol/L (ref 2–17)
BUN: 15 mg/dL (ref 8–23)
CO2: 25 mmol/L (ref 22–29)
Calcium: 8.8 mg/dL (ref 8.8–10.2)
Chloride: 102 mmol/L (ref 98–107)
Creatinine: 0.7 mg/dL (ref 0.7–1.3)
GFR African American: 109 mL/min/{1.73_m2} (ref >=90)
GFR Non-African American: 94 mL/min/{1.73_m2} (ref >=90)
Glucose: 87 mg/dL (ref 70–99)
OSMOLALITY CALCULATED: 274 mOsm/kg (ref 270–287)
Potassium: 4.1 mmol/L (ref 3.5–5.3)
Sodium: 137 mmol/L (ref 135–145)

## 2020-03-13 LAB — TROPONIN T
Troponin T: 0.054 ng/mL (ref 0.000–0.010)
Troponin T: 0.052 ng/mL (ref 0.000–0.010)

## 2020-03-13 LAB — LACTIC ACID
Lactic Acid: 1.1 mmol/L (ref 0.5–2.0)
Lactic Acid: 1 mmol/L (ref 0.5–2.0)

## 2020-03-13 LAB — COVID-19: SARS-CoV-2: NOT DETECTED

## 2020-03-13 NOTE — ED Notes (Signed)
ED Pre-Arrival Note        Pre-Arrival Summary    Name:  Jorge Allison, Jorge Allison   Current Date:  03/13/2020 11:25:20 EDT  Gender:  Male  Date of Birth:    Age:  74  Pre-Arrival Type:  Referral  ETA:  03/13/2020 10:41:00 EDT  Primary Care Physician:    Presenting Problem:  facial swelling/abscess  Pre-Arrival User:  Oval Linsey, Wilkie Aye  Referring Source:    Location:  [facility]  BP:  145/95  HR:  72  Resp:  18            PreArrival Communication Form  Emergency Department        Additional Patient Information:        Orders:  [    ] CBC                                            [     ] CT Head no contrast  [    ] BMP                                           [     ] CT Abdomen/Pelvis no contrast  [    ] PT/INR                                       [     ] CT Abdomen/Pelvis IV contrast, w/ oral contrast  [    ] Troponin                                   [     ] CT Abdomen/Pelvis IV contrast, no oral contrast  [    ] BNP                                            [     ] See ordersheet  [    ] CXR                                             [     ] Other:__________________________  [    ] EKG

## 2020-03-13 NOTE — ED Notes (Signed)
ED Patient Education Note     Patient Education Materials Follows:

## 2020-03-13 NOTE — ED Provider Notes (Signed)
Facial Pain *ED        Patient:   Jorge Allison, Jorge Allison            MRN: [id number]            FIN: [id number]               Age:   74 years     Sex:  Male     DOB:  02-08-1946   Associated Diagnoses:   Mandibular abscess   Author:   Tora Duck M-DO      Basic Information   Time seen: Provider Seen (ST)   ED Provider/Time:    Tora Duck M-DO / 03/13/2020 11:41  .   History source: Patient.   Arrival mode: Ambulance.   History limitation: None.   Additional information: Chief Complaint from Nursing Triage Note   Chief Complaint  Chief Complaint: c/o swelling to face and dental abscess. pt states he's had problems breathing and voice changes. pt is taking amoxicllin 500 mg po TID for gum/tooth infection since 5/26. per pt he has been dealing with this since 10/15/19. (03/13/20 11:27:00).      History of Present Illness   74 year old male presents emergency department complaining of swelling to his face and possible dental abscess.  Patient states that he has had some trouble breathing and some voice changes.  He has been taking amoxicillin 500 mg 3 times a day since 5/26.  He noticed the swelling within the last week or so.  Patient lives at [facility] acute care.  He denies fevers or chills.  No respiratory distress at this time.      Review of Systems   Constitutional symptoms:  No fever, no chills.    Skin symptoms:  Negative except as documented in HPI.   Eye symptoms:  Vision unchanged.   ENMT symptoms:  Negative except as documented in HPI.   Respiratory symptoms:  No shortness of breath, no cough.    Cardiovascular symptoms:  No chest pain, no palpitations.    Gastrointestinal symptoms:  No abdominal pain, no nausea, no vomiting, no diarrhea, no rectal bleeding.    Genitourinary symptoms:  No dysuria, no hematuria.    Musculoskeletal symptoms:  No back pain,    Neurologic symptoms:  No headache,              Additional review of systems information: All other systems reviewed and otherwise negative.      Health  Status   Allergies:    Allergic Reactions (Selected)  Unknown  Peanuts- Unknown..   Medications:  (Selected)   Inpatient Medications  Ordered  Isovue-300: 100 mL, IV Contrast, Once  Documented Medications  Documented  Aspir 81: mg, Oral, Daily, 0 Refill(s)  Eliquis 5 mg oral tablet: 5 mg, 1 tabs, Oral, BID, 60 tabs, 0 Refill(s)  Lasix 40 mg oral tablet: 40 mg, 1 tabs, Oral, Daily, 0 Refill(s)  atorvastatin 80 mg oral tablet: 80 mg, 1 tabs, Oral, Daily, 0 Refill(s)  carvedilol 3.125 mg oral tablet: mg, tabs, Oral, BID, 0 Refill(s)  dronabinol 2.5 mg oral capsule: 2.5 mg, 1 caps, Oral, BID, 0 Refill(s)  ferrous sulfate 325 mg (65 mg elemental iron) oral delayed release tablet: mg, tabs, Oral, Daily, 0 Refill(s)  potassium chloride 20 mEq oral tablet, extended release: 20 mEq, 1 tabs, Oral, Daily, 0 Refill(s).      Past Medical/ Family/ Social History   Medical history: Reviewed as documented in chart.  Surgical history: Reviewed as documented in chart.   Family history: Not significant.   Social history: Reviewed as documented in chart.   Problem list:    Active Problems (6)  Atrial fibrillation   Heart failure   HTN (hypertension)   MRSA (methicillin resistant Staphylococcus aureus)   Severe malnutrition   Shortness of breath   , per nurse's notes.      Physical Examination               Vital Signs   Vital Signs   7/41/2878 67:67 EDT Systolic Blood Pressure 209 mmHg    Diastolic Blood Pressure 470 mmHg  HI    Temperature Oral 36.1 degC    Heart Rate Monitored 71 bpm    Respiratory Rate 18 br/min    SpO2 98 %   .   Measurements   03/13/2020 11:34 EDT Body Mass Index est meas 20.12 kg/m2   03/13/2020 11:34 EDT Body Mass Index Measured 20.12 kg/m2   03/13/2020 11:27 EDT Height/Length Measured 188 cm    Weight Dosing 71.1 kg   .   Basic Oxygen Information   03/13/2020 11:27 EDT Oxygen Therapy Room air    SpO2 98 %   .   General:  Alert, no acute distress.    Skin:  Warm, dry, intact.    Head:  Normocephalic, atraumatic.     Neck:  Supple, trachea midline.    Eye:  Extraocular movements are intact, normal conjunctiva.    Ears, nose, mouth and throat:  Mouth: Bleeding, abscess, fluctuance, Around the anterior portion of the mandible.   Cardiovascular:  Regular rate and rhythm, Normal peripheral perfusion.    Respiratory:  Lungs are clear to auscultation, respirations are non-labored.    Chest wall:  No tenderness.   Back:  Normal range of motion.   Musculoskeletal:  Normal ROM, no deformity.    Gastrointestinal:  Soft, Nontender, Non distended.    Neurological:  Alert and oriented to person, place, time, and situation, No focal neurological deficit observed.       Medical Decision Making   Rationale:  Patient noted to have abscess around an implant in his jaw.  I discussed this with the patient and he states that he had a cosmetic silicone implant placed by his dentist 10 to 15 years ago.  I spoke briefly with the on-call ENT who states that this is unfortunately out of his scope of practice and that the patient will need to be transferred to [facility] for evaluation by OMFS.  I spoke with the on-call surgeon and the ER physician at [facility] and they will accept the patient in transfer.    Incidentally the patient was found to have a mildly elevated troponin.  He has a history of A. fib and is currently in A. fib rate controlled.  We will plan to repeat troponin.  Patient denies any chest pain at this time.    Covid testing was done and the patient was found to be negative.    Repeat troponin trended slightly down.  Patient continues to have no chest pain.  He was complaining of facial pain and was given morphine, and now is resting comfortably..   Documents reviewed:  Emergency department nurses' notes.   Electrocardiogram:  Emergency Provider interpretation performed by me, See ECG ED Review.    Results review:  Lab results : Lab View   03/13/2020 14:15 EDT Lactic Acid Lvl 1.0 mmol/L    SARS CoV2 Only (  GX) Not Detected   03/13/2020 12:38 EDT  Appear U POC Clear    Color U POC Yellow    Bili U POC Negative    Blood U POC Negative    Glucose U POC Negative mg/dL    Ketones U POC Negative mg/dL    Leuk Est U POC Negative    Nitrite U POC Negative    pH U POC 6.0    Protein U POC Negative    Spec Grav U POC 1.020    Urobilin U POC 0.2 EU/dL   03/13/2020 12:03 EDT Estimated Creatinine Clearance 95.93 mL/min   03/13/2020 11:41 EDT WBC 8.1 x10e3/mcL    RBC 4.10 x10e6/mcL    Hgb 9.9 g/dL  LOW    HCT 34.1 %  LOW    MCV 83.2 fL  LOW    MCH 24.1 pg  LOW    MCHC 29.0 g/dL  LOW    RDW 19.1 %  HI    Platelet 164 x10e3/mcL    MPV 8.8 fL    Neutro Auto 78.7 %  HI    Neutro Absolute 6.3 x10e3/mcL    Immature Grans Percent 0.4 %    Immature Grans Absolute 0.03 x10e3/mcL    Lymph Auto 9.4 %  LOW    Lymph Absolute 0.8 x10e3/mcL  LOW    Mono Auto 9.9 %    Mono Absolute 0.8 x10e3/mcL    Eosinophil Percent 1.0 %    Eos Absolute 0.1 x10e3/mcL    Basophil Auto 0.6 %    Baso Absolute 0.1 x10e3/mcL    NRBC Absolute Auto 0.000 x10e3/mcL    NRBC Percent Auto 0.0 %    Sodium Lvl 137 mmol/L    Potassium Lvl 4.1 mmol/L    Chloride 102 mmol/L    CO2 25 mmol/L    Glucose Random 87 mg/dL    BUN 15 mg/dL    Creatinine Lvl 0.7 mg/dL    AGAP 9 mmol/L    Osmolality Calc 274 mOsm/kg    Calcium Lvl 8.8 mg/dL    eGFR AA 109 mL/min/1.65m???    eGFR Non-AA 94 mL/min/1.728m??    Lactic Acid Lvl 1.1 mmol/L    Trop T Quant 0.054 ng/mL  CRIT   03/13/2020 11:34 EDT Estimated Creatinine Clearance 111.92 mL/min     .   Radiology results:  Rad Results (ST)   CT Soft Tissue Neck w/ Contrast  ?  03/13/20 13:57:19  CT NECK WITH CONTRAST    DATE: 03/13/20    INDICATION: Neck mass, nonpulsatile;ACR Select.    TECHNIQUE: Post contrast axial images obtained through the neck, submitted at 3  mm thickness with coronal and sagittal reformations. CT scanning was performed  using radiation dose reduction techniques, where appropriate, per system  protocols.    COMPARISON: None.    FINDINGS:    There is a curvilinear  object tracking along the anterior portion of the  mandible consistent with a surgically implanted device. There is a fluid  collection tracking along the superficial margin of this device that is  suspicious for abscess. This collection measures approximately 1.5 cm in  thickness and 3.8 cm in craniocaudal dimension, tracking superficially along  nearly the entirety of this device, for roughly 9 cm in length. There is  subcutaneous stranding in the submental and submandibular regions but no  additional fluid collections are seen. The patient is edentulous.    Included paranasal structures are unremarkable. The orbits, paranasal sinuses,  mastoid air  cells, minor salivary glands, and thyroid gland demonstrate no acute  abnormality. The larger cervical vessels appear grossly patent. There is  evidence of bilateral pleural effusions with some groundglass density in the  lungs, with evidence of prior sternotomy. Tiny bilateral thyroid nodules are  present, nonspecific.      IMPRESSION:    1. The patient has an implanted device along the superficial portion of the  anterior mandible. There is a large curvilinear fluid collection tracking  superficial to this device, suggestive of abscess.    2. Findings at the lung apices included pulmonary edema and pleural effusions.  ?  Signed By: Priscella Mann KARL-MD  ?  **************************************************  XR Chest 1 View Portable  ?  03/13/20 13:28:02  PORTABLE AP VIEW OF THE CHEST    DATE: 03/13/20.    INDICATION:Shortness of breath (SOB).    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 There is prominence and indistinctness of central vasculature, with  abnormal hazy density in the lungs. There is blunting of the lateral construct  sulci, more pronounced on the right. No pneumothorax or acute osseous  abnormality.      IMPRESSION: Cardiogenic  edema with bilateral pleural effusions, right greater  than left.  ?  Signed By: Novella Olive    .      Impression and Plan   Diagnosis   Mandibular abscess (ICD10-CM M27.2, Discharge, Medical)      Calls-Consults   -  03/13/2020 15:01:00 , Dr Everette Rank - OMFS @ [place] / Dr Adrian [facility] ED.    Plan   Condition: Stable.    Disposition: Transfer to other location: Time: 03/13/2020 15:20:00, Facility name: [facility] ED, Accepted by: Everette Rank.    Counseled: Patient, Regarding diagnosis, Regarding diagnostic results, Regarding treatment plan, Patient indicated understanding of instructions.    Signature Line     Electronically Signed on 03/13/2020 04:14 PM EDT   ________________________________________________   Tora Duck M-DO      Electronically Signed on 03/13/2020 04:15 PM EDT   ________________________________________________   Tora Duck M-DO            Modified by: Tora Duck M-DO on 03/13/2020 04:10 PM EDT      Modified by: Tora Duck M-DO on 03/13/2020 04:10 PM EDT      Modified by: Tora Duck M-DO on 03/13/2020 04:14 PM EDT      Modified by: Tora Duck M-DO on 03/13/2020 04:15 PM EDTAddendum by Tora Duck M-DO on Mar 13, 2020 17:26 EDT               03/13/2020 17:26:15: Patient is being loaded onto the transport stretcher and noted to have increasing blood pressures.  Will give 10 mg of hydralazine IV prior to transfer  [employer] Signed on 03/13/2020 05:26 PM EDT   ________________________________________________   Tora Duck M-DO               Modified by: Tora Duck M-DO on 03/13/2020 05:26 PM EDT

## 2020-03-13 NOTE — ED Notes (Signed)
ED Triage Note       ED Secondary Triage Entered On:  03/13/2020 16:42 EDT    Performed On:  03/13/2020 16:40 EDT by Cathey Endow, RN, OLIVIA S               General Information   Barriers to Learning :   None evident   ED Home Meds Section :   Document assessment   [facility] ED Fall Risk Section :   Document assessment   ED Advance Directives Section :   Document assessment   ED Palliative Screen :   Document assessment   Cathey Endow, RN, Maryann Alar - 03/13/2020 16:40 EDT   (As Of: 03/13/2020 16:42:59 EDT)   Problems(Active)    Anemia (SNOMED CT  :169450388 )  Name of Problem:   Anemia ; Recorder:   BOWEN, RN, Maryann Alar; Confirmation:   Confirmed ; Classification:   Patient Stated ; Code:   [id number] ; Contributor System:   PowerChart ; Last Updated:   03/13/2020 16:42 EDT ; Life Cycle Date:   03/13/2020 ; Life Cycle Status:   Active ; Vocabulary:   SNOMED CT        Atrial fibrillation (SNOMED CT  :79150569 )  Name of Problem:   Atrial fibrillation ; Recorder:   PLOTKIN, RN, Janetta Hora; Confirmation:   Confirmed ; Classification:   Patient Stated ; Code:   79480165 ; Contributor System:   Dietitian ; Last Updated:   10/10/2019 21:43 EST ; Life Cycle Date:   10/10/2019 ; Life Cycle Status:   Active ; Vocabulary:   SNOMED CT        Dysphagia (SNOMED CT  :53748270 )  Name of Problem:   Dysphagia ; Recorder:   BOWEN, RN, Maryann Alar; Confirmation:   Confirmed ; Classification:   Patient Stated ; Code:   78675449 ; Contributor System:   Dietitian ; Last Updated:   03/13/2020 16:42 EDT ; Life Cycle Date:   03/13/2020 ; Life Cycle Status:   Active ; Vocabulary:   SNOMED CT        Heart failure (SNOMED CT  :201007121 )  Name of Problem:   Heart failure ; Recorder:   PLOTKIN, RN, DANIELLE K; Confirmation:   Confirmed ; Classification:   Patient Stated ; Code:   [id number] ; Contributor System:   Dietitian ; Last Updated:   10/10/2019 21:43 EST ; Life Cycle Date:   10/10/2019 ; Life Cycle Status:   Active ; Vocabulary:   SNOMED CT        HTN  (hypertension) (SNOMED CT  :9826415830 )  Name of Problem:   HTN (hypertension) ; Recorder:   PLOTKIN, RN, Janetta Hora; Confirmation:   Confirmed ; Classification:   Patient Stated ; Code:   [id number] ; Contributor System:   Dietitian ; Last Updated:   10/10/2019 21:43 EST ; Life Cycle Date:   10/10/2019 ; Life Cycle Status:   Active ; Vocabulary:   SNOMED CT        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 )  Name of Problem:   Mitral valve disorder ; Recorder:   BOWEN, RN, OLIVIA S; Confirmation:   Confirmed ; Classification:   Patient Stated ; Code:   85929244 ; Contributor System:   PowerChart ; Last Updated:   03/13/2020 16:41 EDT ; Life Cycle Date:   03/13/2020 ; Life Cycle Status:   Active ; Vocabulary:   SNOMED CT        MRSA (methicillin  resistant Staphylococcus aureus) (SNOMED CT  :7893810175 )  Name of Problem:   MRSA (methicillin resistant Staphylococcus aureus) ; Onset Date:   10/11/2019 ; Recorder:   HOLMES,  MARY H; Confirmation:   Confirmed ; Classification:   Patient Stated ; Code:   1025852778 ; Contributor System:   Conservation officer, nature ; Last Updated:   10/13/2019 14:24 EST ; Life Cycle Date:   10/13/2019 ; Life Cycle Status:   Active ; Vocabulary:   SNOMED CT   ; Comments:        10/13/2019 14:24 - Waymon Budge H  MRSA Alert Nares      Pulmonary HTN (SNOMED CT  :242353614 )  Name of Problem:   Pulmonary HTN ; Recorder:   BOWEN, RN, OLIVIA S; Confirmation:   Confirmed ; Classification:   Patient Stated ; Code:   431540086 ; Contributor System:   PowerChart ; Last Updated:   03/13/2020 16:42 EDT ; Life Cycle Date:   03/13/2020 ; Life Cycle Status:   Active ; Vocabulary:   SNOMED CT        Severe malnutrition (SNOMED CT  :[id number] D04 )  Name of Problem:   Severe malnutrition ; Recorder:   Neita Garnet; Confirmation:   Confirmed ; Classification:   Medical ; Code:   [id number] H96 ; Contributor System:   PowerChart ; Last Updated:   10/11/2019 11:54 EST ; Life  Cycle Date:   10/11/2019 ; Life Cycle Status:   Active ; Responsible Provider:   Marissa Nestle D-MD; Vocabulary:   SNOMED CT        Shortness of breath (IMO  :22297 )  Name of Problem:   Shortness of breath ; Recorder:   SYSTEM,  SYSTEM; Confirmation:   Confirmed ; Classification:   Patient Stated ; Code:   98921 ; Last Updated:   10/11/2019 2:37 EST ; Life Cycle Date:   10/11/2019 ; Life Cycle Status:   Active ; Vocabulary:   IMO          Diagnoses(Active)    Abscess - complicated  Date:   1/94/1740 ; Diagnosis Type:   Reason For Visit ; Confirmation:   Complaint of ; Clinical Dx:   Abscess - complicated ; Classification:   Medical ; Clinical Service:   Non-Specified ; Code:   PNED ; Probability:   0 ; Diagnosis Code:   [id number] CF      Mandibular abscess  Date:   03/13/2020 ; Diagnosis Type:   Discharge ; Confirmation:   Confirmed ; Clinical Dx:   Mandibular abscess ; Classification:   Medical ; Clinical Service:   Non-Specified ; Code:   ICD-10-CM ; Probability:   0 ; Diagnosis Code:   M27.2             -    Procedure History   (As Of: 03/13/2020 16:42:59 EDT)     Lennette Bihari Fall Risk Assessment Tool   Hx of falling last 3 months ED Fall :   Yes (Single mechanical fall)   Patient confused or disoriented ED Fall :   No   Patient intoxicated or sedated ED Fall :   No   Patient impaired gait ED Fall :   Yes   Use a mobility assistance device ED Fall :   Yes   Patient altered elimination ED Fall :   No   [facility] ED Fall Score :   3    BOWEN, RNEvelina Bucy - 03/13/2020 16:40 EDT   ED  Advance Directive   Advance Directive :   No   BOWEN, RN, Maryann Alar - 03/13/2020 16:40 EDT   Palliative Care   Does the Patient have a Life Limiting Illness :   None of the above   BOWEN, RN, Maryann Alar - 03/13/2020 16:40 EDT   Med Hx   Medication List   (As Of: 03/13/2020 16:42:59 EDT)   Normal Order    morphine 4 mg/mL preservative-free Sol 1 mL  :   morphine 4 mg/mL preservative-free Sol 1 mL ; Status:   Completed ;  Ordered As Mnemonic:   morphine ; Simple Display Line:   4 mg, 1 mL, IV Push, Once ; Ordering Provider:   Gilman Buttner M-DO; Catalog Code:   morphine ; Order Dt/Tm:   03/13/2020 15:53:27 EDT          piperacillin-tazo  3.375 g/100 ml NS ADM  :   piperacillin-tazo  3.375 g/100 ml NS ADM ; Status:   Completed ; Ordered As Mnemonic:   piperacillin-tazobactam ( Zosyn ) ; Simple Display Line:   3.375 g, 100 mL, 200 mL/hr, IV Piggyback, Once ; Ordering Provider:   Gilman Buttner M-DO; Catalog Code:   piperacillin-tazobactam ; Order Dt/Tm:   03/13/2020 14:12:21 EDT          iopamidol 61% Inj Soln 100 mL  :   iopamidol 61% Inj Soln 100 mL ; Status:   Ordered ; Ordered As Mnemonic:   Isovue-300 ; Simple Display Line:   100 mL, IV Contrast, Once ; Ordering Provider:   Gilman Buttner M-DO; Catalog Code:   iopamidol ; Order Dt/Tm:   03/13/2020 12:01:17 EDT          iopamidol 61% Inj Soln 100 mL  :   iopamidol 61% Inj Soln 100 mL ; Status:   Completed ; Ordered As Mnemonic:   Isovue-300 ; Simple Display Line:   100 mL, IV Contrast, Once ; Ordering Provider:   Gilman Buttner M-DO; Catalog Code:   iopamidol ; Order Dt/Tm:   03/13/2020 12:00:36 EDT            Home Meds    potassium chloride  :   potassium chloride ; Status:   Documented ; Ordered As Mnemonic:   potassium chloride 20 mEq oral tablet, extended release ; Simple Display Line:   20 mEq, 1 tabs, Oral, Daily, 0 Refill(s) ; Catalog Code:   potassium chloride ; Order Dt/Tm:   10/10/2019 21:43:11 EST          apixaban  :   apixaban ; Status:   Documented ; Ordered As Mnemonic:   Eliquis 5 mg oral tablet ; Simple Display Line:   5 mg, 1 tabs, Oral, BID, 60 tabs, 0 Refill(s) ; Catalog Code:   apixaban ; Order Dt/Tm:   10/10/2019 21:42:33 EST          dronabinol  :   dronabinol ; Status:   Documented ; Ordered As Mnemonic:   dronabinol 2.5 mg oral capsule ; Simple Display Line:   2.5 mg, 1 caps, Oral, BID, 0 Refill(s) ; Catalog Code:   dronabinol ; Order Dt/Tm:   10/10/2019 21:42:21  EST          ferrous sulfate  :   ferrous sulfate ; Status:   Documented ; Ordered As Mnemonic:   ferrous sulfate 325 mg (65 mg elemental iron) oral delayed release tablet ; Simple Display Line:   mg, tabs, Oral,  Daily, 0 Refill(s) ; Catalog Code:   ferrous sulfate ; Order Dt/Tm:   10/10/2019 21:42:45 EST          furosemide  :   furosemide ; Status:   Documented ; Ordered As Mnemonic:   Lasix 40 mg oral tablet ; Simple Display Line:   40 mg, 1 tabs, Oral, Daily, 0 Refill(s) ; Catalog Code:   furosemide ; Order Dt/Tm:   10/10/2019 21:42:54 EST          aspirin  :   aspirin ; Status:   Documented ; Ordered As Mnemonic:   Aspir 81 ; Simple Display Line:   mg, Oral, Daily, 0 Refill(s) ; Catalog Code:   aspirin ; Order Dt/Tm:   10/10/2019 21:41:35 EST          atorvastatin  :   atorvastatin ; Status:   Documented ; Ordered As Mnemonic:   atorvastatin 80 mg oral tablet ; Simple Display Line:   80 mg, 1 tabs, Oral, Daily, 0 Refill(s) ; Catalog Code:   atorvastatin ; Order Dt/Tm:   10/10/2019 21:41:43 EST          carvedilol  :   carvedilol ; Status:   Documented ; Ordered As Mnemonic:   carvedilol 3.125 mg oral tablet ; Simple Display Line:   mg, tabs, Oral, BID, 0 Refill(s) ; Catalog Code:   carvedilol ; Order Dt/Tm:   10/10/2019 21:41:58 EST

## 2020-03-13 NOTE — ED Notes (Signed)
 ED Patient Summary              [facility] Emergency Department  [address], [place] [address]  [phone]  Discharge Instructions (Patient)  _______________________________________     Name: Jorge Allison, Jorge Allison  DOB: 1945-10-22                   MRN: [id number]                   FIN: [id number]  Reason For Visit: Abscess - complicated; FACIAL SWELLING  Final Diagnosis: Mandibular abscess     Visit Date: 03/13/2020 11:22:00  Address: [address] [place] [address]  Phone: [phone]     Emergency Department Providers:         Primary Physician:   NEYSA ANDREZ CHRISTELLA [facility]el would like to thank you for allowing us  to assist you with your healthcare needs. The following includes patient education materials and information regarding your injury/illness.     Follow-up Instructions:  You were seen today on an emergency basis. Please contact your primary care doctor for a follow up appointment. If you received a referral to a specialist doctor, it is important you follow-up as instructed.    It is important that you call your follow-up doctor to schedule and confirm the location of your next appointment. Your doctor may practice at multiple locations. The office location of your follow-up appointment may be different to the one written on your discharge instructions.    If you do not have a primary care doctor, please call [phone] for help in finding a [facility] Provider. For help in finding a specialist doctor, please call [phone].    The [facility] "Ask a Nurse" line in staffed by Registered Nurses and is a free service to the community. We are available Monday - Friday from 8am to 5pm to answer your questions about your health. Please call [phone].    If your condition gets worse before your follow-up with your primary care doctor or specialist, please return to the Emergency Department.        Coronavirus 2019  (COVID-19) Reminders:     Patients aged 30 and older, people with increased risk for severe COVID-19 disease, or frontline workers with increased occupational risk can make an appointment for a COVID-19 vaccine. Patients can contact their [facility] doctors' offices to schedule an appointment to receive the COVID-19 vaccine at the [facility] or send us  an email at [email or web address]. Patients who do not have a [facility] physician can call [phone] to schedule vaccination appointments.            Scan this code with your phone camera to send an email to the address above.          Follow Up Appointments:  Primary Care Provider:      Name: PCP,  NONE      Phone:           Printed Prescriptions:    Patient Education Materials:  Discharge Orders       Comment:             Allergy Info: Peanuts     Medication Information:  [facility] provided you with a complete list of medications post discharge, if you have been instructed to  stop taking a medication please ensure you also follow up with this information to your Primary Care Physician.  Unless otherwise noted, patient will continue to take medications as prescribed prior to the Emergency Room visit.  Any specific questions regarding your chronic medications and dosages should be discussed with your physician(s) and pharmacist.          apixaban (Eliquis 5 mg oral tablet) 1 Tabs Oral (given by mouth) 2 times a day.  aspirin (Aspir 81) Oral (given by mouth) every day.  atorvastatin (atorvastatin 80 mg oral tablet) 1 Tabs Oral (given by mouth) every day.  carvedilol (carvedilol 3.125 mg oral tablet) Oral (given by mouth) 2 times a day.  dronabinol (dronabinol 2.5 mg oral capsule) 1 Capsules Oral (given by mouth) 2 times a day.  ferrous sulfate (ferrous sulfate 325 mg (65 mg elemental iron) oral delayed release tablet) Oral (given by mouth) every day.  furosemide (Lasix 40 mg oral tablet) 1  Tabs Oral (given by mouth) every day.  potassium chloride (potassium chloride 20 mEq oral tablet, extended release) 1 Tabs Oral (given by mouth) every day.      Medications Administered During Visit:              Medication Dose Route   iopamidol 100 mL IV Contrast   piperacillin-tazobactam 3.375 g IV Piggyback   morphine 4 mg IV Push   hydrALAZINE 10 mg IV Push          Major Tests and Procedures:  The following procedures and tests were performed during your ED visit.  COMMON PROCEDURES%>  COMMON PROCEDURES COMMENTS%>          Laboratory Orders  Name Status Details   .UA POC Completed Urine, RT, RT - Routine, Collected, 03/13/20 12:38:00 EDT, Nurse collect, 03/13/20 12:38:00 US Robinette, RAL POC Login   BMP Completed Blood, Stat, ST - Stat, 03/13/20 11:39:00 EDT, 03/13/20 11:39:00 EDT, Nurse collect, BOWEN, RN, OLIVIA S, Print label Y/N   C Blood Ordered Blood, Peripheral, Stat, ST - Stat, 03/13/20 11:39:00 EDT, Once, 03/13/20 11:41:00 EDT, Nurse collect   C Blood Ordered Blood, Peripheral, Stat, ST - Stat, 03/13/20 14:12:00 EDT, Once, 03/13/20 14:15:00 EDT, Nurse collect, 2 of 2   CBCDIFF Completed Blood, Stat, ST - Stat, 03/13/20 11:39:00 EDT, 03/13/20 11:39:00 EDT, Nurse collect, BOWEN, RN, OLIVIA S, Print label Y/N   COV OnlyGX Completed Nasopharyngeal Swab, Stat, ST - Stat, 03/13/20 14:12:00 EDT, Once, 03/13/20 14:13:00 EDT, Nurse collect, YOUNG,  KELLI M-DO, Print label Y/N   Lactic Completed Blood, Stat, ST - Stat, 03/13/20 11:39:00 EDT, 03/13/20 11:39:00 EDT, Nurse collect, BOWEN, RN, OLIVIA S, Print label Y/N   Lactic Completed Blood, Stat, ST - Stat, 03/13/20 14:12:00 EDT, 03/13/20 14:13:00 EDT, Nurse collect, NEYSA,  KELLI M-DO, Print label Y/N   Trop T Completed Blood, Stat, ST - Stat, 03/13/20 11:39:00 EDT, 03/13/20 11:39:00 EDT, Nurse collect, BOWEN, RN, SCHUYLER RAMAN, Print label Y/N   Trop T Completed Blood, Stat, ST - Stat, 03/13/20 15:11:00 EDT, 03/13/20 15:11:00 EDT, Nurse collect, Print label Y/N                Radiology Orders  Name Status Details   CT Soft Tissue Neck w/ Contrast Completed 03/13/20 12:01:00 EDT, STAT 1 hour or less, Reason: ACR Select, Reason: Neck mass, nonpulsatile, Transport Mode: STRETCHER, Rad Type, pp_set_radiology_subspecialty, [id number], 9, G1004, ME   XR Chest 1 View Portable Completed 03/13/20 12:46:00 EDT, STAT 1 hour or less, Reason: Shortness of  breath  (SOB), Transport Mode: STRETCHER, pp_set_radiology_subspecialty               Patient Care Orders  Name Status Details   COVID-19 Status Ordered 03/13/20 14:13:01 EDT, NOT VALID FOR pharmacy, laboratory, radiology., 03/13/20 14:13:01 EDT, COVID-19 Not Detected   Cardiac/NIBP/Pulse Ox Monitoring Ordered 03/13/20 11:39:00 EDT, This message can only be seen by Nursing, it is not visible to Pharmacy, Laboratory, or Radiology., 03/13/20 11:39:00 EDT, 03/13/20 11:39:00 EDT, Once   Communication to Nursing Completed 03/13/20 16:42:59 EDT, Move into view of RN station if possible, 03/13/20 16:42:59 EDT   DC ISO Order/Icons Ordered 03/13/20 15:17:47 EDT, 03/13/20 15:17:47 EDT   ED Assessment Adult Completed 03/13/20 11:34:38 EDT, 03/13/20 11:34:38 EDT   ED Secondary Triage Completed 03/13/20 11:34:38 EDT, 03/13/20 11:34:38 EDT   ED Tracking - MRSA Ordered 03/13/20 11:22:53 EDT, NOT VALID FOR pharmacy, laboratory, radiology., 03/13/20 11:22:53 EDT   ED Triage Adult Completed 03/13/20 11:22:53 EDT, 03/13/20 11:22:53 EDT   Fall Risk Precautions Ordered 03/13/20 16:42:59 EDT, 03/13/20 16:42:59 EDT   Notify Provider Completed 03/13/20 14:13:01 EDT, This message can only be seen by Nursing, it is not visible to Pharmacy, Laboratory, or Radiology., 03/13/20 14:13:01 EDT   POC-Urine Dipstick collect Completed 03/13/20 11:39:00 EDT, Once, 03/13/20 11:39:00 EDT   Patient Isolation Ordered 03/13/20 11:39:00 EDT, Protective, Constant Indicator   Patient Specific Fall Safety Measures Ordered 03/14/20 5:00:00 EDT, ED Mod/High Fall Risk Documentation    Patient Specific Fall Safety Measures Ordered 03/14/20 6:00:00 EDT, ED Mod/High Fall Risk Documentation   Patient Specific Fall Safety Measures Ordered 03/14/20 7:00:00 EDT, ED Mod/High Fall Risk Documentation   Patient Specific Fall Safety Measures Ordered 03/14/20 8:00:00 EDT, ED Mod/High Fall Risk Documentation   Patient Specific Fall Safety Measures Ordered 03/14/20 9:00:00 EDT, ED Mod/High Fall Risk Documentation   Patient Specific Fall Safety Measures Ordered 03/14/20 10:00:00 EDT, ED Mod/High Fall Risk Documentation   Patient Specific Fall Safety Measures Ordered 03/14/20 11:00:00 EDT, ED Mod/High Fall Risk Documentation   Patient Specific Fall Safety Measures Ordered 03/14/20 12:00:00 EDT, ED Mod/High Fall Risk Documentation   Patient Specific Fall Safety Measures Ordered 03/14/20 13:00:00 EDT, ED Mod/High Fall Risk Documentation   Patient Specific Fall Safety Measures Ordered 03/14/20 14:00:00 EDT, ED Mod/High Fall Risk Documentation   Patient Specific Fall Safety Measures Ordered 03/14/20 15:00:00 EDT, ED Mod/High Fall Risk Documentation   Patient Specific Fall Safety Measures Ordered 03/14/20 16:00:00 EDT, ED Mod/High Fall Risk Documentation   Patient Specific Fall Safety Measures Ordered 03/14/20 17:00:00 EDT, ED Mod/High Fall Risk Documentation   Patient Specific Fall Safety Measures Ordered 03/14/20 18:00:00 EDT, ED Mod/High Fall Risk Documentation   Patient Specific Fall Safety Measures Ordered 03/14/20 19:00:00 EDT, ED Mod/High Fall Risk Documentation   Patient Specific Fall Safety Measures Ordered 03/14/20 20:00:00 EDT, ED Mod/High Fall Risk Documentation   Patient Specific Fall Safety Measures Ordered 03/14/20 21:00:00 EDT, ED Mod/High Fall Risk Documentation   Patient Specific Fall Safety Measures Ordered 03/14/20 22:00:00 EDT, ED Mod/High Fall Risk Documentation   Patient Specific Fall Safety Measures Ordered 03/14/20 23:00:00 EDT, ED Mod/High Fall Risk Documentation   Patient Specific Fall  Safety Measures Ordered 03/13/20 17:00:00 EDT, ED Mod/High Fall Risk Documentation   Patient Specific Fall Safety Measures Ordered 03/13/20 18:00:00 EDT, ED Mod/High Fall Risk Documentation   Patient Specific Fall Safety Measures Ordered 03/13/20 19:00:00 EDT, ED Mod/High Fall Risk Documentation   Patient Specific Fall Safety Measures Ordered 03/13/20 20:00:00 EDT, ED Mod/High Fall  Risk Documentation   Patient Specific Fall Safety Measures Ordered 03/13/20 21:00:00 EDT, ED Mod/High Fall Risk Documentation   Patient Specific Fall Safety Measures Ordered 03/13/20 22:00:00 EDT, ED Mod/High Fall Risk Documentation   Patient Specific Fall Safety Measures Ordered 03/13/20 23:00:00 EDT, ED Mod/High Fall Risk Documentation   Patient Specific Fall Safety Measures Ordered 03/14/20 0:00:00 EDT, ED Mod/High Fall Risk Documentation   Patient Specific Fall Safety Measures Ordered 03/14/20 1:00:00 EDT, ED Mod/High Fall Risk Documentation   Patient Specific Fall Safety Measures Ordered 03/14/20 2:00:00 EDT, ED Mod/High Fall Risk Documentation   Patient Specific Fall Safety Measures Ordered 03/13/20 16:42:59 EDT, q1hr, ED Mod/High Fall Risk Documentation   Patient Specific Fall Safety Measures Ordered 03/13/20 16:42:59 EDT, Once, 03/13/20 16:42:59 EDT, ED Mod/High Fall Risk Documentation   Patient Specific Fall Safety Measures Ordered 03/14/20 3:00:00 EDT, ED Mod/High Fall Risk Documentation   Patient Specific Fall Safety Measures Ordered 03/14/20 4:00:00 EDT, ED Mod/High Fall Risk Documentation   Saline Lock Insert Completed 03/13/20 11:39:00 EDT, Once, 03/13/20 11:39:00 EDT   Transport Patient Ordered 03/13/20 15:24:00 EDT       ---------------------------------------------------------------------------------------------------------------------  [facility]) encourages you to self-enroll in the [facility] Patient Portal.  [facility] Patient Portal will allow you to manage your personal health  information securely from your own electronic device now and in the future.  To begin your Patient Portal enrollment process, please visit [email or web address] Click on "Sign up now" under [facility].  If you find that you need additional assistance on the [facility] Patient Portal or need a copy of your medical records, please call the [facility] Medical Records Office at [phone].  Comment:

## 2020-03-13 NOTE — Discharge Summary (Signed)
 ED Clinical Summary                     [facility]  [address]  [place], [place], [address]  [phone]          PERSON INFORMATION  Name: Jorge Allison, Jorge Allison Age:  74 Years DOB: 1945/12/26   Sex: Male Language: English PCP: PCP,  NONE   Marital Status: Single Phone: [phone] Med Service: MED-Medicine   MRN: [id number] Acct# [id number] Arrival: 03/13/2020 11:22:00   Visit Reason: Abscess - complicated; FACIAL SWELLING Acuity: 3 LOS: 000 06:05   Address:    [address] [place] [address]   Diagnosis:    Mandibular abscess  Medications:          Medications that have not changed  Other Medications  apixaban (Eliquis 5 mg oral tablet) 1 Tabs Oral (given by mouth) 2 times a day.  Last Dose:____________________  aspirin (Aspir 81) Oral (given by mouth) every day.  Last Dose:____________________  atorvastatin (atorvastatin 80 mg oral tablet) 1 Tabs Oral (given by mouth) every day.  Last Dose:____________________  carvedilol (carvedilol 3.125 mg oral tablet) Oral (given by mouth) 2 times a day.  Last Dose:____________________  dronabinol (dronabinol 2.5 mg oral capsule) 1 Capsules Oral (given by mouth) 2 times a day.  Last Dose:____________________  ferrous sulfate (ferrous sulfate 325 mg (65 mg elemental iron) oral delayed release tablet) Oral (given by mouth) every day.  Last Dose:____________________  furosemide (Lasix 40 mg oral tablet) 1 Tabs Oral (given by mouth) every day.  Last Dose:____________________  potassium chloride (potassium chloride 20 mEq oral tablet, extended release) 1 Tabs Oral (given by mouth) every day.  Last Dose:____________________      Medications Administered During Visit:                Medication Dose Route   iopamidol 100 mL IV Contrast   piperacillin-tazobactam 3.375 g IV Piggyback   morphine 4 mg IV Push   hydrALAZINE 10 mg IV Push               Allergies      Peanuts (Unknown)      Major Tests and Procedures:  The following procedures and tests were  performed during your ED visit.  COMMON PROCEDURES%>  COMMON PROCEDURES COMMENTS%>                PROVIDER INFORMATION               Provider Role Assigned Sampson HADDOCK, RN, SCHUYLER RAMAN ED Nurse 03/13/2020 11:26:57    NEYSA SPANNER M-DO ED Provider 03/13/2020 11:41:48        Attending Physician:  NEYSA SPANNER M-DO      Admit Doc  YOUNG,  KELLI M-DO     Consulting Doc  DANI,  EDWARD-MD     VITALS INFORMATION  Vital Sign Triage Latest   Temp Oral ORAL_1%> ORAL%>   Temp Temporal TEMPORAL_1%> TEMPORAL%>   Temp Intravascular INTRAVASCULAR_1%> INTRAVASCULAR%>   Temp Axillary AXILLARY_1%> AXILLARY%>   Temp Rectal RECTAL_1%> RECTAL%>   02 Sat 98 % 99 %   Respiratory Rate RATE_1%> RATE%>   Peripheral Pulse Rate PULSE RATE_1%> PULSE RATE%>   Apical Heart Rate HEART RATE_1%> HEART RATE%>   Blood Pressure BLOOD PRESSURE_1%>/ BLOOD PRESSURE_1%>100 mmHg BLOOD PRESSURE%> / BLOOD PRESSURE%>106 mmHg                 Immunizations  No Immunizations Documented This Visit          DISCHARGE INFORMATION   Discharge Disposition: O Outpt-Another Facility   Discharge Location:  [facility]   Discharge Date and Time:  03/13/2020 17:27:00   ED Checkout Date and Time:  03/13/2020 17:27:00     DEPART REASON INCOMPLETE INFORMATION               Depart Action Incomplete Reason   Interactive View/I&O Recently assessed   Patient Education Recently assessed   Follow-up Recently assessed               Problems      Active           Severe malnutrition              Smoking Status      Former smoker, quit more than 30 days ago         PATIENT EDUCATION INFORMATION  Instructions:          Follow up:            ED PROVIDER DOCUMENTATION     Patient:   Jorge, Allison            MRN: [id number]            FIN: [id number]               Age:   67 years     Sex:  Male     DOB:  [birth year]   Associated Diagnoses:   Mandibular abscess   Author:   NEYSA SPANNER M-DO      Basic Information   Time seen: Provider Seen (ST)   ED Provider/Time:    NEYSA SPANNER M-DO /  03/13/2020 11:41  .   History source: Patient.   Arrival mode: Ambulance.   History limitation: None.   Additional information: Chief Complaint from Nursing Triage Note   Chief Complaint  Chief Complaint: c/o swelling to face and dental abscess. pt states he's had problems breathing and voice changes. pt is taking amoxicllin 500 mg po TID for gum/tooth infection since 5/26. per pt he has been dealing with this since 10/15/19. (03/13/20 11:27:00).      History of Present Illness   74 year old male presents emergency department complaining of swelling to his face and possible dental abscess.  Patient states that he has had some trouble breathing and some voice changes.  He has been taking amoxicillin 500 mg 3 times a day since 5/26.  He noticed the swelling within the last week or so.  Patient lives at [facility] acute care.  He denies fevers or chills.  No respiratory distress at this time.      Review of Systems   Constitutional symptoms:  No fever, no chills.    Skin symptoms:  Negative except as documented in HPI.   Eye symptoms:  Vision unchanged.   ENMT symptoms:  Negative except as documented in HPI.   Respiratory symptoms:  No shortness of breath, no cough.    Cardiovascular symptoms:  No chest pain, no palpitations.    Gastrointestinal symptoms:  No abdominal pain, no nausea, no vomiting, no diarrhea, no rectal bleeding.    Genitourinary symptoms:  No dysuria, no hematuria.    Musculoskeletal symptoms:  No back pain,    Neurologic symptoms:  No headache,              Additional review of systems information: All other  systems reviewed and otherwise negative.      Health Status   Allergies:    Allergic Reactions (Selected)  Unknown  Peanuts- Unknown..   Medications:  (Selected)   Inpatient Medications  Ordered  Isovue-300: 100 mL, IV Contrast, Once  Documented Medications  Documented  Aspir 81: mg, Oral, Daily, 0 Refill(s)  Eliquis 5 mg oral tablet: 5 mg, 1 tabs, Oral, BID, 60 tabs, 0 Refill(s)  Lasix 40 mg  oral tablet: 40 mg, 1 tabs, Oral, Daily, 0 Refill(s)  atorvastatin 80 mg oral tablet: 80 mg, 1 tabs, Oral, Daily, 0 Refill(s)  carvedilol 3.125 mg oral tablet: mg, tabs, Oral, BID, 0 Refill(s)  dronabinol 2.5 mg oral capsule: 2.5 mg, 1 caps, Oral, BID, 0 Refill(s)  ferrous sulfate 325 mg (65 mg elemental iron) oral delayed release tablet: mg, tabs, Oral, Daily, 0 Refill(s)  potassium chloride 20 mEq oral tablet, extended release: 20 mEq, 1 tabs, Oral, Daily, 0 Refill(s).      Past Medical/ Family/ Social History   Medical history: Reviewed as documented in chart.   Surgical history: Reviewed as documented in chart.   Family history: Not significant.   Social history: Reviewed as documented in chart.   Problem list:    Active Problems (6)  Atrial fibrillation   Heart failure   HTN (hypertension)   MRSA (methicillin resistant Staphylococcus aureus)   Severe malnutrition   Shortness of breath   , per nurse's notes.      Physical Examination               Vital Signs   Vital Signs   03/13/2020 11:27 EDT Systolic Blood Pressure 138 mmHg    Diastolic Blood Pressure 100 mmHg  HI    Temperature Oral 36.1 degC    Heart Rate Monitored 71 bpm    Respiratory Rate 18 br/min    SpO2 98 %   .   Measurements   03/13/2020 11:34 EDT Body Mass Index est meas 20.12 kg/m2   03/13/2020 11:34 EDT Body Mass Index Measured 20.12 kg/m2   03/13/2020 11:27 EDT Height/Length Measured 188 cm    Weight Dosing 71.1 kg   .   Basic Oxygen Information   03/13/2020 11:27 EDT Oxygen Therapy Room air    SpO2 98 %   .   General:  Alert, no acute distress.    Skin:  Warm, dry, intact.    Head:  Normocephalic, atraumatic.    Neck:  Supple, trachea midline.    Eye:  Extraocular movements are intact, normal conjunctiva.    Ears, nose, mouth and throat:  Mouth: Bleeding, abscess, fluctuance, Around the anterior portion of the mandible.   Cardiovascular:  Regular rate and rhythm, Normal peripheral perfusion.    Respiratory:  Lungs are clear to auscultation,  respirations are non-labored.    Chest wall:  No tenderness.   Back:  Normal range of motion.   Musculoskeletal:  Normal ROM, no deformity.    Gastrointestinal:  Soft, Nontender, Non distended.    Neurological:  Alert and oriented to person, place, time, and situation, No focal neurological deficit observed.       Medical Decision Making   Rationale:  Patient noted to have abscess around an implant in his jaw.  I discussed this with the patient and he states that he had a cosmetic silicone implant placed by his dentist 10 to 15 years ago.  I spoke briefly with the on-call ENT who states that this is unfortunately out  of his scope of practice and that the patient will need to be transferred to [facility] for evaluation by OMFS.  I spoke with the on-call surgeon and the ER physician at [facility] and they will accept the patient in transfer.    Incidentally the patient was found to have a mildly elevated troponin.  He has a history of A. fib and is currently in A. fib rate controlled.  We will plan to repeat troponin.  Patient denies any chest pain at this time.    Covid testing was done and the patient was found to be negative.    Repeat troponin trended slightly down.  Patient continues to have no chest pain.  He was complaining of facial pain and was given morphine, and now is resting comfortably..   Documents reviewed:  Emergency department nurses' notes.   Electrocardiogram:  Emergency Provider interpretation performed by me, See ECG ED Review.    Results review:  Lab results : Lab View   03/13/2020 14:15 EDT Lactic Acid Lvl 1.0 mmol/L    SARS CoV2 Only (GX) Not Detected   03/13/2020 12:38 EDT Appear U POC Clear    Color U POC Yellow    Bili U POC Negative    Blood U POC Negative    Glucose U POC Negative mg/dL    Ketones U POC Negative mg/dL    Leuk Est U POC Negative    Nitrite U POC Negative    pH U POC 6.0    Protein U POC Negative    Spec Grav U POC 1.020    Urobilin U POC 0.2 EU/dL   4/70/7978 87:96 EDT Estimated  Creatinine Clearance 95.93 mL/min   03/13/2020 11:41 EDT WBC 8.1 x10e3/mcL    RBC 4.10 x10e6/mcL    Hgb 9.9 g/dL  LOW    HCT 65.8 %  LOW    MCV 83.2 fL  LOW    MCH 24.1 pg  LOW    MCHC 29.0 g/dL  LOW    RDW 80.8 %  HI    Platelet 164 x10e3/mcL    MPV 8.8 fL    Neutro Auto 78.7 %  HI    Neutro Absolute 6.3 x10e3/mcL    Immature Grans Percent 0.4 %    Immature Grans Absolute 0.03 x10e3/mcL    Lymph Auto 9.4 %  LOW    Lymph Absolute 0.8 x10e3/mcL  LOW    Mono Auto 9.9 %    Mono Absolute 0.8 x10e3/mcL    Eosinophil Percent 1.0 %    Eos Absolute 0.1 x10e3/mcL    Basophil Auto 0.6 %    Baso Absolute 0.1 x10e3/mcL    NRBC Absolute Auto 0.000 x10e3/mcL    NRBC Percent Auto 0.0 %    Sodium Lvl 137 mmol/L    Potassium Lvl 4.1 mmol/L    Chloride 102 mmol/L    CO2 25 mmol/L    Glucose Random 87 mg/dL    BUN 15 mg/dL    Creatinine Lvl 0.7 mg/dL    AGAP 9 mmol/L    Osmolality Calc 274 mOsm/kg    Calcium Lvl 8.8 mg/dL    eGFR AA 890 fO/fpw/8.26f    eGFR Non-AA 94 mL/min/1.89m    Lactic Acid Lvl 1.1 mmol/L    Trop T Quant 0.054 ng/mL  CRIT   03/13/2020 11:34 EDT Estimated Creatinine Clearance 111.92 mL/min     .   Radiology results:  Rad Results (ST)   CT Soft Tissue Neck w/  Contrast  ?  03/13/20 13:57:19  CT NECK WITH CONTRAST    DATE: 03/13/20    INDICATION: Neck mass, nonpulsatile;ACR Select.    TECHNIQUE: Post contrast axial images obtained through the neck, submitted at 3  mm thickness with coronal and sagittal reformations. CT scanning was performed  using radiation dose reduction techniques, where appropriate, per system  protocols.    COMPARISON: None.    FINDINGS:    There is a curvilinear object tracking along the anterior portion of the  mandible consistent with a surgically implanted device. There is a fluid  collection tracking along the superficial margin of this device that is  suspicious for abscess. This collection measures approximately 1.5 cm in  thickness and 3.8 cm in craniocaudal dimension, tracking  superficially along  nearly the entirety of this device, for roughly 9 cm in length. There is  subcutaneous stranding in the submental and submandibular regions but no  additional fluid collections are seen. The patient is edentulous.    Included paranasal structures are unremarkable. The orbits, paranasal sinuses,  mastoid air cells, minor salivary glands, and thyroid gland demonstrate no acute  abnormality. The larger cervical vessels appear grossly patent. There is  evidence of bilateral pleural effusions with some groundglass density in the  lungs, with evidence of prior sternotomy. Tiny bilateral thyroid nodules are  present, nonspecific.      IMPRESSION:    1. The patient has an implanted device along the superficial portion of the  anterior mandible. There is a large curvilinear fluid collection tracking  superficial to this device, suggestive of abscess.    2. Findings at the lung apices included pulmonary edema and pleural effusions.  ?  Signed By: LENON BARTER KARL-MD  ?  **************************************************  XR Chest 1 View Portable  ?  03/13/20 13:28:02  PORTABLE AP VIEW OF THE CHEST    DATE: 03/13/20.    INDICATION:Shortness of breath (SOB).    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 There is prominence and indistinctness of central vasculature, with  abnormal hazy density in the lungs. There is blunting of the lateral construct  sulci, more pronounced on the right. No pneumothorax or acute osseous  abnormality.      IMPRESSION: Cardiogenic edema with bilateral pleural effusions, right greater  than left.  ?  Signed By: LENON BARTER BROOM    .      Impression and Plan   Diagnosis   Mandibular abscess (ICD10-CM M27.2, Discharge, Medical)      Calls-Consults   -  03/13/2020 15:01:00 , Dr Brita - OMFS @ [facility] / Dr Daniel [facility] ED.    Plan   Condition: Stable.     Disposition: Transfer to other location: Time: 03/13/2020 15:20:00, Facility name: [facility] ED, Accepted by: Afmaro.    Counseled: Patient, Regarding diagnosis, Regarding diagnostic results, Regarding treatment plan, Patient indicated understanding of instructions.    Addendum by NEYSA SPANNER M-DO on Mar 13, 2020 17:26 EDT           03/13/2020 17:26:15: Patient is being loaded onto the transport stretcher and noted to have increasing blood pressures.  Will give 10 mg of hydralazine IV prior to transfer

## 2020-03-13 NOTE — ED Notes (Signed)
ED Triage Note       ED Triage Adult Entered On:  03/13/2020 11:34 EDT    Performed On:  03/13/2020 11:27 EDT by Cathey Endow, RN, OLIVIA S               Triage   Chief Complaint :   c/o swelling to face and dental abscess. pt states he's had problems breathing and voice changes. pt is taking amoxicllin 500 mg po TID for gum/tooth infection since 5/26. per pt he has been dealing with this since 10/15/19.    Numeric Rating Pain Scale :   7   [place] Mode of Arrival :   Ambulance   Infectious Disease Documentation :   Document assessment   Temperature Oral :   36.1 degC(Converted to: 97.0 degF)    Heart Rate Monitored :   71 bpm   Respiratory Rate :   18 br/min   Systolic Blood Pressure :   138 mmHg   Diastolic Blood Pressure :   100 mmHg (HI)    SpO2 :   98 %   Oxygen Therapy :   Room air   Patient presentation :   None of the above   Chief Complaint or Presentation suggest infection :   No   Dosing Weight Obtained By :   Patient stated   Weight Dosing :   71.1 kg(Converted to: 156 lb 12 oz)    Height :   188 cm(Converted to: 6 ft 2 in)    Body Mass Index Dosing :   20 kg/m2   BOWEN, RN, Maryann Alar - 03/13/2020 11:27 EDT   DCP GENERIC CODE   Tracking Acuity :   3   Tracking Group :   ED [address] Tracking Group   [place], RNMaryann Alar - 03/13/2020 11:27 EDT   ED General Section :   Document assessment   Pregnancy Status :   N/A   ED Allergies Section :   Document assessment   ED Reason for Visit Section :   Document assessment   Cathey Endow RNMaryann Alar - 03/13/2020 11:27 EDT   PTA/Triage Treatments   ED PTA Pre-Arrival Service :   [facility]   [place], [place], Maryann Alar - 03/13/2020 11:27 EDT   ID Risk Screen Symptoms   Recent Travel History :   No recent travel   Close Contact with COVID-19 ID :   No   Last 14 days COVID-19 ID :   No   TB Symptom Screen :   No symptoms   C. diff Symptom/History ID :   Neither of the above   BOWEN, RN, Zollie Scale S - 03/13/2020 11:27 EDT   Allergies   (As Of: 03/13/2020 11:34:37 EDT)   Allergies (Active)    Peanuts  Estimated Onset Date:   Unspecified ; Reactions:   Unknown ; Created ByMariah Milling, RN, Darcey Nora; Reaction Status:   Active ; Category:   Drug ; Substance:   Peanuts ; Type:   Allergy ; Severity:   Unknown ; Updated By:   Mariah Milling, RN, Darcey Nora; Source:   Other ; Reviewed Date:   03/13/2020 11:29 EDT        Psycho-Social   Last 3 mo, thoughts killing self/others :   Patient denies   Right click within box for Suspected Abuse policy link. :   None   Feels Safe Where Live :   Yes   BOWEN, RN, Maryann Alar -  03/13/2020 11:27 EDT   ED Reason for Visit   (As Of: 03/13/2020 11:34:37 EDT)   Problems(Active)    Atrial fibrillation (SNOMED CT  :73220254 )  Name of Problem:   Atrial fibrillation ; Recorder:   PLOTKIN, RN, Braulio Bosch; Confirmation:   Confirmed ; Classification:   Patient Stated ; Code:   [id number] ; Contributor System:   Conservation officer, nature ; Last Updated:   10/10/2019 21:43 EST ; Life Cycle Date:   10/10/2019 ; Life Cycle Status:   Active ; Vocabulary:   SNOMED CT        Heart failure (SNOMED CT  :283151761 )  Name of Problem:   Heart failure ; Recorder:   PLOTKIN, RN, DANIELLE K; Confirmation:   Confirmed ; Classification:   Patient Stated ; Code:   [id number] ; Contributor System:   Conservation officer, nature ; Last Updated:   10/10/2019 21:43 EST ; Life Cycle Date:   10/10/2019 ; Life Cycle Status:   Active ; Vocabulary:   SNOMED CT        HTN (hypertension) (SNOMED CT  :6948546270 )  Name of Problem:   HTN (hypertension) ; Recorder:   PLOTKIN, RN, Braulio Bosch; Confirmation:   Confirmed ; Classification:   Patient Stated ; Code:   [id number] ; Contributor System:   Conservation officer, nature ; Last Updated:   10/10/2019 21:43 EST ; Life Cycle Date:   10/10/2019 ; Life Cycle Status:   Active ; Vocabulary:   SNOMED CT        MRSA (methicillin resistant Staphylococcus aureus) (SNOMED CT  :9937169678 )  Name of Problem:   MRSA (methicillin resistant Staphylococcus aureus) ; Onset Date:   10/11/2019 ; Recorder:   HOLMES,  MARY H;  Confirmation:   Confirmed ; Classification:   Patient Stated ; Code:   [id number] ; Contributor System:   Conservation officer, nature ; Last Updated:   10/13/2019 14:24 EST ; Life Cycle Date:   10/13/2019 ; Life Cycle Status:   Active ; Vocabulary:   SNOMED CT   ; Comments:        10/13/2019 14:24 - Waymon Budge H  MRSA Alert Nares      Severe malnutrition (SNOMED CT  :[id number] D04 )  Name of Problem:   Severe malnutrition ; Recorder:   Neita Garnet; Confirmation:   Confirmed ; Classification:   Medical ; Code:   [id number] H41 ; Contributor System:   PowerChart ; Last Updated:   10/11/2019 11:54 EST ; Life Cycle Date:   10/11/2019 ; Life Cycle Status:   Active ; Responsible Provider:   Marissa Nestle D-MD; Vocabulary:   SNOMED CT        Shortness of breath (IMO  :93790 )  Name of Problem:   Shortness of breath ; Recorder:   SYSTEM,  SYSTEM; Confirmation:   Confirmed ; Classification:   Patient Stated ; Code:   24097 ; Last Updated:   10/11/2019 2:37 EST ; Life Cycle Date:   10/11/2019 ; Life Cycle Status:   Active ; Vocabulary:   IMO          Diagnoses(Active)    Abscess - complicated  Date:   3/53/2992 ; Diagnosis Type:   Reason For Visit ; Confirmation:   Complaint of ; Clinical Dx:   Abscess - complicated ; Classification:   Medical ; Clinical Service:   Non-Specified ; Code:   PNED ; Probability:   0 ; Diagnosis Code:   [id number] CF

## 2020-03-14 HISTORY — PX: REMOVAL OF IMPLANT: SHX6451

## 2020-03-16 LAB — CULTURE, BLOOD 1

## 2020-03-19 LAB — CULTURE, BLOOD 1

## 2020-05-11 ENCOUNTER — Emergency Department (HOSPITAL_BASED_OUTPATIENT_CLINIC_OR_DEPARTMENT_OTHER): Payer: Medicare Other

## 2020-05-11 ENCOUNTER — Encounter (HOSPITAL_BASED_OUTPATIENT_CLINIC_OR_DEPARTMENT_OTHER): Payer: Self-pay | Admitting: Emergency Medicine

## 2020-05-11 ENCOUNTER — Inpatient Hospital Stay (HOSPITAL_BASED_OUTPATIENT_CLINIC_OR_DEPARTMENT_OTHER)
Admission: EM | Admit: 2020-05-11 | Discharge: 2020-05-19 | DRG: 871 | Disposition: A | Discharge status: Home / Self Care | Payer: Medicare Other | Attending: Internal Medicine | Admitting: Internal Medicine

## 2020-05-11 ENCOUNTER — Other Ambulatory Visit: Payer: Self-pay

## 2020-05-11 DIAGNOSIS — Z953 Presence of xenogenic heart valve: Secondary | ICD-10-CM

## 2020-05-11 DIAGNOSIS — I2510 Atherosclerotic heart disease of native coronary artery without angina pectoris: Secondary | ICD-10-CM | POA: Diagnosis present

## 2020-05-11 DIAGNOSIS — I10 Essential (primary) hypertension: Secondary | ICD-10-CM | POA: Diagnosis not present

## 2020-05-11 DIAGNOSIS — Z8673 Personal history of transient ischemic attack (TIA), and cerebral infarction without residual deficits: Secondary | ICD-10-CM | POA: Diagnosis not present

## 2020-05-11 DIAGNOSIS — I110 Hypertensive heart disease with heart failure: Secondary | ICD-10-CM | POA: Diagnosis present

## 2020-05-11 DIAGNOSIS — N390 Urinary tract infection, site not specified: Secondary | ICD-10-CM | POA: Diagnosis present

## 2020-05-11 DIAGNOSIS — R54 Age-related physical debility: Secondary | ICD-10-CM | POA: Diagnosis present

## 2020-05-11 DIAGNOSIS — R6520 Severe sepsis without septic shock: Secondary | ICD-10-CM | POA: Diagnosis not present

## 2020-05-11 DIAGNOSIS — L89151 Pressure ulcer of sacral region, stage 1: Secondary | ICD-10-CM | POA: Diagnosis present

## 2020-05-11 DIAGNOSIS — K8020 Calculus of gallbladder without cholecystitis without obstruction: Secondary | ICD-10-CM | POA: Diagnosis present

## 2020-05-11 DIAGNOSIS — I5043 Acute on chronic combined systolic (congestive) and diastolic (congestive) heart failure: Secondary | ICD-10-CM | POA: Diagnosis present

## 2020-05-11 DIAGNOSIS — Z951 Presence of aortocoronary bypass graft: Secondary | ICD-10-CM | POA: Diagnosis not present

## 2020-05-11 DIAGNOSIS — J9811 Atelectasis: Secondary | ICD-10-CM | POA: Diagnosis present

## 2020-05-11 DIAGNOSIS — A419 Sepsis, unspecified organism: Secondary | ICD-10-CM | POA: Diagnosis not present

## 2020-05-11 DIAGNOSIS — I081 Rheumatic disorders of both mitral and tricuspid valves: Secondary | ICD-10-CM | POA: Diagnosis present

## 2020-05-11 DIAGNOSIS — I493 Ventricular premature depolarization: Secondary | ICD-10-CM | POA: Diagnosis present

## 2020-05-11 DIAGNOSIS — E46 Unspecified protein-calorie malnutrition: Secondary | ICD-10-CM | POA: Diagnosis present

## 2020-05-11 DIAGNOSIS — I248 Other forms of acute ischemic heart disease: Secondary | ICD-10-CM | POA: Diagnosis present

## 2020-05-11 DIAGNOSIS — D696 Thrombocytopenia, unspecified: Secondary | ICD-10-CM | POA: Diagnosis present

## 2020-05-11 DIAGNOSIS — Z7901 Long term (current) use of anticoagulants: Secondary | ICD-10-CM | POA: Diagnosis not present

## 2020-05-11 DIAGNOSIS — Z87891 Personal history of nicotine dependence: Secondary | ICD-10-CM

## 2020-05-11 DIAGNOSIS — E872 Acidosis: Secondary | ICD-10-CM | POA: Diagnosis present

## 2020-05-11 DIAGNOSIS — R6521 Severe sepsis with septic shock: Secondary | ICD-10-CM | POA: Diagnosis present

## 2020-05-11 DIAGNOSIS — D509 Iron deficiency anemia, unspecified: Secondary | ICD-10-CM | POA: Diagnosis present

## 2020-05-11 DIAGNOSIS — Z79899 Other long term (current) drug therapy: Secondary | ICD-10-CM

## 2020-05-11 DIAGNOSIS — I5041 Acute combined systolic (congestive) and diastolic (congestive) heart failure: Secondary | ICD-10-CM | POA: Diagnosis not present

## 2020-05-11 DIAGNOSIS — M549 Dorsalgia, unspecified: Secondary | ICD-10-CM | POA: Diagnosis present

## 2020-05-11 DIAGNOSIS — I878 Other specified disorders of veins: Secondary | ICD-10-CM | POA: Diagnosis present

## 2020-05-11 DIAGNOSIS — L299 Pruritus, unspecified: Secondary | ICD-10-CM | POA: Diagnosis not present

## 2020-05-11 DIAGNOSIS — Z952 Presence of prosthetic heart valve: Secondary | ICD-10-CM | POA: Diagnosis not present

## 2020-05-11 DIAGNOSIS — N179 Acute kidney failure, unspecified: Secondary | ICD-10-CM | POA: Diagnosis not present

## 2020-05-11 DIAGNOSIS — Z6821 Body mass index (BMI) 21.0-21.9, adult: Secondary | ICD-10-CM

## 2020-05-11 DIAGNOSIS — K760 Fatty (change of) liver, not elsewhere classified: Secondary | ICD-10-CM | POA: Diagnosis present

## 2020-05-11 DIAGNOSIS — I480 Paroxysmal atrial fibrillation: Secondary | ICD-10-CM | POA: Diagnosis present

## 2020-05-11 DIAGNOSIS — E785 Hyperlipidemia, unspecified: Secondary | ICD-10-CM | POA: Diagnosis present

## 2020-05-11 DIAGNOSIS — I509 Heart failure, unspecified: Secondary | ICD-10-CM

## 2020-05-11 DIAGNOSIS — R739 Hyperglycemia, unspecified: Secondary | ICD-10-CM | POA: Diagnosis not present

## 2020-05-11 DIAGNOSIS — G8929 Other chronic pain: Secondary | ICD-10-CM | POA: Diagnosis present

## 2020-05-11 DIAGNOSIS — R1010 Upper abdominal pain, unspecified: Secondary | ICD-10-CM

## 2020-05-11 DIAGNOSIS — R1084 Generalized abdominal pain: Secondary | ICD-10-CM

## 2020-05-11 DIAGNOSIS — Z20822 Contact with and (suspected) exposure to covid-19: Secondary | ICD-10-CM | POA: Diagnosis present

## 2020-05-11 DIAGNOSIS — Z8614 Personal history of Methicillin resistant Staphylococcus aureus infection: Secondary | ICD-10-CM

## 2020-05-11 DIAGNOSIS — L8990 Pressure ulcer of unspecified site, unspecified stage: Secondary | ICD-10-CM | POA: Insufficient documentation

## 2020-05-11 DIAGNOSIS — R579 Shock, unspecified: Secondary | ICD-10-CM | POA: Diagnosis not present

## 2020-05-11 DIAGNOSIS — R0902 Hypoxemia: Secondary | ICD-10-CM

## 2020-05-11 DIAGNOSIS — J189 Pneumonia, unspecified organism: Secondary | ICD-10-CM | POA: Diagnosis present

## 2020-05-11 DIAGNOSIS — R109 Unspecified abdominal pain: Secondary | ICD-10-CM

## 2020-05-11 DIAGNOSIS — E876 Hypokalemia: Secondary | ICD-10-CM | POA: Diagnosis present

## 2020-05-11 DIAGNOSIS — I5031 Acute diastolic (congestive) heart failure: Secondary | ICD-10-CM | POA: Diagnosis not present

## 2020-05-11 DIAGNOSIS — B961 Klebsiella pneumoniae [K. pneumoniae] as the cause of diseases classified elsewhere: Secondary | ICD-10-CM | POA: Diagnosis present

## 2020-05-11 DIAGNOSIS — I361 Nonrheumatic tricuspid (valve) insufficiency: Secondary | ICD-10-CM | POA: Diagnosis not present

## 2020-05-11 DIAGNOSIS — I340 Nonrheumatic mitral (valve) insufficiency: Secondary | ICD-10-CM | POA: Diagnosis not present

## 2020-05-11 DIAGNOSIS — I482 Chronic atrial fibrillation, unspecified: Secondary | ICD-10-CM | POA: Diagnosis not present

## 2020-05-11 HISTORY — DX: Unspecified atrial fibrillation: I48.91

## 2020-05-11 HISTORY — DX: Chronic combined systolic (congestive) and diastolic (congestive) heart failure: I50.42

## 2020-05-11 HISTORY — DX: Methicillin resistant Staphylococcus aureus infection as the cause of diseases classified elsewhere: B95.62

## 2020-05-11 HISTORY — DX: Patient's noncompliance with other medical treatment and regimen due to unspecified reason: Z91.199

## 2020-05-11 HISTORY — DX: Unspecified protein-calorie malnutrition: E46

## 2020-05-11 HISTORY — DX: Bacteremia: R78.81

## 2020-05-11 HISTORY — DX: Transient cerebral ischemic attack, unspecified: G45.9

## 2020-05-11 HISTORY — DX: Patient's noncompliance with other medical treatment and regimen: Z91.19

## 2020-05-11 HISTORY — DX: Nonrheumatic aortic (valve) stenosis: I35.0

## 2020-05-11 HISTORY — DX: Acute and subacute infective endocarditis: I33.0

## 2020-05-11 HISTORY — DX: Atherosclerotic heart disease of native coronary artery without angina pectoris: I25.10

## 2020-05-11 LAB — CBC WITH DIFFERENTIAL/PLATELET
Abs Immature Granulocytes: 0.03 10*3/uL (ref 0.00–0.07)
Basophils Absolute: 0 10*3/uL (ref 0.0–0.1)
Basophils Relative: 0 %
Eosinophils Absolute: 0 10*3/uL (ref 0.0–0.5)
Eosinophils Relative: 0 %
HCT: 31.7 % — ABNORMAL LOW (ref 39.0–52.0)
Hemoglobin: 9.5 g/dL — ABNORMAL LOW (ref 13.0–17.0)
Immature Granulocytes: 0 %
Lymphocytes Relative: 2 %
Lymphs Abs: 0.1 10*3/uL — ABNORMAL LOW (ref 0.7–4.0)
MCH: 23.7 pg — ABNORMAL LOW (ref 26.0–34.0)
MCHC: 30 g/dL (ref 30.0–36.0)
MCV: 79.1 fL — ABNORMAL LOW (ref 80.0–100.0)
Monocytes Absolute: 0.3 10*3/uL (ref 0.1–1.0)
Monocytes Relative: 4 %
Neutro Abs: 6.4 10*3/uL (ref 1.7–7.7)
Neutrophils Relative %: 94 %
Platelets: 102 10*3/uL — ABNORMAL LOW (ref 150–400)
RBC: 4.01 MIL/uL — ABNORMAL LOW (ref 4.22–5.81)
RDW: 19.6 % — ABNORMAL HIGH (ref 11.5–15.5)
Smear Review: NORMAL
WBC: 6.8 10*3/uL (ref 4.0–10.5)
nRBC: 0 % (ref 0.0–0.2)

## 2020-05-11 LAB — LACTIC ACID, PLASMA
Lactic Acid, Venous: 4.2 mmol/L (ref 0.5–1.9)
Lactic Acid, Venous: 2.4 mmol/L (ref 0.5–1.9)

## 2020-05-11 LAB — TROPONIN I (HIGH SENSITIVITY)
Troponin I (High Sensitivity): 98 ng/L — ABNORMAL HIGH (ref <18)
Troponin I (High Sensitivity): 105 ng/L (ref <18)

## 2020-05-11 LAB — COMPREHENSIVE METABOLIC PANEL
ALT: 20 U/L (ref 0–44)
AST: 43 U/L — ABNORMAL HIGH (ref 15–41)
Albumin: 3.3 g/dL — ABNORMAL LOW (ref 3.5–5.0)
Alkaline Phosphatase: 94 U/L (ref 38–126)
Anion gap: 15 (ref 5–15)
BUN: 31 mg/dL — ABNORMAL HIGH (ref 8–23)
CO2: 20 mmol/L — ABNORMAL LOW (ref 22–32)
Calcium: 8.7 mg/dL — ABNORMAL LOW (ref 8.9–10.3)
Chloride: 103 mmol/L (ref 98–111)
Creatinine, Ser: 1.2 mg/dL (ref 0.61–1.24)
GFR calc Af Amer: >60 mL/min (ref >60)
GFR calc non Af Amer: >60 mL/min (ref >60)
Glucose, Bld: 92 mg/dL (ref 70–99)
Potassium: 3.9 mmol/L (ref 3.5–5.1)
Sodium: 138 mmol/L (ref 135–145)
Total Bilirubin: 1.8 mg/dL — ABNORMAL HIGH (ref 0.3–1.2)
Total Protein: 6.7 g/dL (ref 6.5–8.1)

## 2020-05-11 LAB — PROTIME-INR
INR: 2.1 — ABNORMAL HIGH (ref 0.8–1.2)
Prothrombin Time: 23.2 seconds — ABNORMAL HIGH (ref 11.4–15.2)

## 2020-05-11 LAB — URINALYSIS, ROUTINE W REFLEX MICROSCOPIC
Glucose, UA: NEGATIVE mg/dL
Hgb urine dipstick: NEGATIVE
Ketones, ur: 15 mg/dL — AB
Leukocytes,Ua: NEGATIVE
Nitrite: NEGATIVE
Protein, ur: 100 mg/dL — AB
Specific Gravity, Urine: >1.03 — ABNORMAL HIGH (ref 1.005–1.030)
pH: 5 (ref 5.0–8.0)

## 2020-05-11 LAB — SARS CORONAVIRUS 2 BY RT PCR (HOSPITAL ORDER, PERFORMED IN ~~LOC~~ HOSPITAL LAB): SARS Coronavirus 2: NEGATIVE

## 2020-05-11 LAB — URINALYSIS, MICROSCOPIC (REFLEX)

## 2020-05-11 LAB — APTT: aPTT: 42 seconds — ABNORMAL HIGH (ref 24–36)

## 2020-05-11 LAB — BRAIN NATRIURETIC PEPTIDE: B Natriuretic Peptide: 2352.4 pg/mL — ABNORMAL HIGH (ref 0.0–100.0)

## 2020-05-11 MED ORDER — SODIUM CHLORIDE 0.9 % IV SOLN: 500.0000 mg | INTRAVENOUS | Status: DC

## 2020-05-11 MED ORDER — SODIUM CHLORIDE 0.9 % IV SOLN
1.0000 g | Freq: Once | INTRAVENOUS | Status: AC
  Administered 2020-05-11: 1 g via INTRAVENOUS
  Filled 2020-05-11: qty 10

## 2020-05-11 MED ORDER — SODIUM CHLORIDE 0.9 % IV BOLUS (SEPSIS)
1000.0000 mL | Freq: Once | INTRAVENOUS | Status: AC
  Administered 2020-05-11: 1000 mL via INTRAVENOUS

## 2020-05-11 MED ORDER — DOCUSATE SODIUM 100 MG PO CAPS
100.0000 mg | ORAL_CAPSULE | Freq: Two times a day (BID) | ORAL | Status: DC
  Administered 2020-05-11 – 2020-05-12 (×2): 100 mg via ORAL
  Filled 2020-05-11 (×2): qty 1

## 2020-05-11 MED ORDER — ATORVASTATIN CALCIUM 40 MG PO TABS
80.0000 mg | ORAL_TABLET | Freq: Every day | ORAL | Status: DC
  Administered 2020-05-11 – 2020-05-12 (×2): 80 mg via ORAL
  Filled 2020-05-11 (×3): qty 2

## 2020-05-11 MED ORDER — ALBUTEROL SULFATE (2.5 MG/3ML) 0.083% IN NEBU: 2.5000 mg | INHALATION_SOLUTION | Freq: Four times a day (QID) | RESPIRATORY_TRACT | Status: DC | PRN

## 2020-05-11 MED ORDER — POLYETHYLENE GLYCOL 3350 17 G PO PACK: 17.0000 g | PACK | Freq: Every day | ORAL | Status: DC | PRN

## 2020-05-11 MED ORDER — FUROSEMIDE 40 MG PO TABS
40.0000 mg | ORAL_TABLET | Freq: Every day | ORAL | Status: DC
  Administered 2020-05-11 – 2020-05-12 (×2): 40 mg via ORAL
  Filled 2020-05-11 (×2): qty 1

## 2020-05-11 MED ORDER — ONDANSETRON HCL 4 MG/2ML IJ SOLN
4.0000 mg | Freq: Four times a day (QID) | INTRAMUSCULAR | Status: DC | PRN
  Administered 2020-05-12 – 2020-05-16 (×5): 4 mg via INTRAVENOUS
  Filled 2020-05-11 (×5): qty 2

## 2020-05-11 MED ORDER — ACETAMINOPHEN 325 MG PO TABS
650.0000 mg | ORAL_TABLET | Freq: Once | ORAL | Status: AC
  Administered 2020-05-11: 650 mg via ORAL
  Filled 2020-05-11: qty 2

## 2020-05-11 MED ORDER — SODIUM CHLORIDE 0.9 % IV SOLN: 1.0000 g | INTRAVENOUS | Status: DC

## 2020-05-11 MED ORDER — ALBUTEROL SULFATE (2.5 MG/3ML) 0.083% IN NEBU: 2.5000 mg | INHALATION_SOLUTION | Freq: Four times a day (QID) | RESPIRATORY_TRACT | Status: DC

## 2020-05-11 MED ORDER — ACETAMINOPHEN 325 MG PO TABS
650.0000 mg | ORAL_TABLET | Freq: Four times a day (QID) | ORAL | Status: DC | PRN
  Administered 2020-05-15 – 2020-05-18 (×2): 650 mg via ORAL
  Filled 2020-05-11 (×2): qty 2

## 2020-05-11 MED ORDER — HYDRALAZINE HCL 20 MG/ML IJ SOLN: 10.0000 mg | Freq: Four times a day (QID) | INTRAMUSCULAR | Status: DC | PRN

## 2020-05-11 MED ORDER — SODIUM CHLORIDE 0.9 % IV SOLN: 500.0000 mg | Freq: Once | INTRAVENOUS | Status: DC

## 2020-05-11 MED ORDER — SODIUM CHLORIDE 0.9 % IV SOLN
INTRAVENOUS | Status: DC | PRN
  Administered 2020-05-11: 250 mL via INTRAVENOUS
  Administered 2020-05-14: 1000 mL via INTRAVENOUS

## 2020-05-11 MED ORDER — APIXABAN 5 MG PO TABS
5.0000 mg | ORAL_TABLET | Freq: Two times a day (BID) | ORAL | Status: DC
  Administered 2020-05-11 – 2020-05-12 (×2): 5 mg via ORAL
  Filled 2020-05-11 (×2): qty 1

## 2020-05-11 MED ORDER — ONDANSETRON HCL 4 MG PO TABS: 4.0000 mg | ORAL_TABLET | Freq: Four times a day (QID) | ORAL | Status: DC | PRN

## 2020-05-11 MED ORDER — CARVEDILOL 6.25 MG PO TABS
6.2500 mg | ORAL_TABLET | Freq: Two times a day (BID) | ORAL | Status: DC
  Administered 2020-05-11 – 2020-05-12 (×2): 6.25 mg via ORAL
  Filled 2020-05-11 (×2): qty 1

## 2020-05-11 MED ORDER — SODIUM CHLORIDE 0.9 % IV SOLN
500.0000 mg | Freq: Once | INTRAVENOUS | Status: AC
  Administered 2020-05-11: 500 mg via INTRAVENOUS
  Filled 2020-05-11: qty 500

## 2020-05-11 MED ORDER — SODIUM CHLORIDE 0.9 % IV SOLN
1.0000 g | Freq: Once | INTRAVENOUS | Status: DC
  Filled 2020-05-11: qty 10

## 2020-05-11 MED ORDER — ACETAMINOPHEN 650 MG RE SUPP: 650.0000 mg | Freq: Four times a day (QID) | RECTAL | Status: DC | PRN

## 2020-05-11 MED ORDER — TAMSULOSIN HCL 0.4 MG PO CAPS
0.4000 mg | ORAL_CAPSULE | Freq: Every day | ORAL | Status: DC
  Administered 2020-05-11 – 2020-05-12 (×2): 0.4 mg via ORAL
  Filled 2020-05-11 (×2): qty 1

## 2020-05-11 NOTE — ED Notes (Addendum)
Cursing at staff, trying to get out of bed. ED MD in to see. Awaiting daughter's arrival, meds and labs on hold, per ED MD

## 2020-05-11 NOTE — ED Notes (Signed)
Lactic Acid 2.4, ED MD given results

## 2020-05-11 NOTE — ED Notes (Signed)
Daughter in to see, ED MD informed

## 2020-05-11 NOTE — ED Notes (Signed)
Calm. States," I feel fine and I want to go home"

## 2020-05-11 NOTE — ED Notes (Signed)
ED Provider at bedside. 

## 2020-05-11 NOTE — ED Triage Notes (Signed)
Arrived via EMS with c/o of body since  sine last night. Daughter called EMS. Alert, FSBS 97, BP 138/84, HR 82, O2 Sat 96%RA, T 98.3,

## 2020-05-11 NOTE — ED Notes (Signed)
Report given to Marjorie Smolder RN

## 2020-05-11 NOTE — ED Provider Notes (Signed)
[facility] EMERGENCY DEPARTMENT Provider Note   CSN: [id number] Arrival date & time: 05/11/20  0720     History Chief Complaint  Patient presents with  . Generalized Body Aches    Carlos Welch is a 74 y.o. male w/ limited known history (self reports "congestive heart failure") presenting by EMS for generalized weakness and body pain.  The patient reports he originally lives in [place] where he receives his care, but has recently moved to [place] to live with his daughter.    His daughter reports the patient had a prosthetic valve replacement about 6 years ago, but he has never followed up with his doctors or taken any of his medications since then.  He suffered from CHF flare ups, and "he would walk into the hospital and then walk out" AMA each time, up to 6 times last year.  He was treated at [facility], and transferred to [facility], where he was told "there's something wrong with his heart, but it would kill him to do a surgery."  He was discharged to [facility] in January 2021.  His daughter brought him to her home in July 2021.    He's been doing "okay" at home, walking, feeding himself, taking his medications.  He self-titrates his lasix dosing at home.  His daughter reports the patient is NOT on hospice and would be amenable to staying in the hospital.      HPI     Past Medical History:  Diagnosis Date  . A-fib (HCC)   . CHF (congestive heart failure) (HCC)   . Hypertension     Patient Active Problem List   Diagnosis Date Noted  . Sepsis (HCC) 05/11/2020    Past Surgical History:  Procedure Laterality Date  . HERNIA REPAIR    . MITRAL VALVE REPLACEMENT         No family history on file.  Social History   Tobacco Use  . Smoking status: Former Smoker  Substance Use Topics  . Alcohol use: No  . Drug use: No    Home Medications Prior to Admission medications   Medication Sig Start  Date End Date Taking? Authorizing Provider  aspirin 325 MG tablet Take 325 mg by mouth daily.    [provider]  ciprofloxacin (CIPRO) 500 MG tablet Take 500 mg by mouth 2 (two) times daily. 04/01/14   [provider]  co-enzyme Q-10 30 MG capsule Take 30 mg by mouth daily. "FORCE FACTOR"    [provider]  Cyanocobalamin (VITAMIN B-12 PO) Take 1 tablet by mouth daily.    [provider]  DC Green 6 POWD Take 5 mLs by mouth daily. Mix with water    [provider]  furosemide (LASIX) 80 MG tablet Take 80 mg by mouth daily.    [provider]  Ginger, Zingiber officinalis, (GINGER ROOT PO) Take 1 tablet by mouth daily.    [provider]  HYDROcodone-acetaminophen (NORCO/VICODIN) 5-325 MG per tablet Take 1 tablet by mouth every 4 (four) hours as needed for moderate pain.    [provider]  LYSINE PO Take 1 tablet by mouth daily.    [provider]  metroNIDAZOLE (FLAGYL) 500 MG tablet Take 500 mg by mouth every 8 (eight) hours.    [provider]  Misc Natural Products (DANDELION ROOT PO) Take 1 tablet by mouth daily.    [provider]  Misc Natural Products (  MULTI-HERB PO) Take 1 tablet by mouth daily.    [provider]  ondansetron (ZOFRAN-ODT) 4 MG disintegrating tablet Take 4 mg by mouth every 6 (six) hours as needed for nausea or vomiting.    [provider]  OVER THE COUNTER MEDICATION Take 1 tablet by mouth daily. "BIOTRUST"    [provider]  promethazine (PHENERGAN) 25 MG tablet Take 25 mg by mouth 3 (three) times daily as needed for nausea or vomiting.    [provider]  spironolactone (ALDACTONE) 25 MG tablet Take 12.5 mg by mouth daily.    [provider]    Allergies    Patient has no known allergies.  Review of Systems   Review of Systems  Constitutional: Positive for appetite change and fatigue. Negative for chills and fever.    Eyes: Negative for pain and visual disturbance.  Respiratory: Negative for cough and shortness of breath.   Cardiovascular: Negative for chest pain and palpitations.  Gastrointestinal: Negative for abdominal pain and vomiting.  Genitourinary: Negative for dysuria and hematuria.  Musculoskeletal: Positive for arthralgias and myalgias.  Skin: Negative for color change and rash.  Neurological: Negative for syncope and headaches.  Psychiatric/Behavioral: Negative for agitation and confusion.  All other systems reviewed and are negative.   Physical Exam Updated Vital Signs BP (!) 119/88   Pulse 71   Temp 100.3 F (37.9 C) (Rectal)   Resp 21   Ht 6\' 2"  (1.88 m)   Wt 73.1 kg   SpO2 100%   BMI 20.70 kg/m   Physical Exam Vitals and nursing note reviewed.  Constitutional:      Appearance: He is well-developed.  HENT:     Head: Normocephalic and atraumatic.  Eyes:     Conjunctiva/sclera: Conjunctivae normal.  Cardiovascular:     Rate and Rhythm: Normal rate and regular rhythm.  Pulmonary:     Effort: Pulmonary effort is normal. No respiratory distress.     Breath sounds: Normal breath sounds.  Abdominal:     General: There is no distension.     Palpations: Abdomen is soft.     Tenderness: There is no abdominal tenderness. There is no guarding.  Musculoskeletal:     Cervical back: Neck supple.  Skin:    General: Skin is warm and dry.  Neurological:     General: No focal deficit present.     Mental Status: He is alert and oriented to person, place, and time.     ED Results / Procedures / Treatments   Labs (all labs ordered are listed, but only abnormal results are displayed) Labs Reviewed  URINALYSIS, ROUTINE W REFLEX MICROSCOPIC - Abnormal; Notable for the following components:      Result Value   Color, Urine AMBER (*)    Specific Gravity, Urine >1.030 (*)    Bilirubin Urine SMALL (*)    Ketones, ur 15 (*)    Protein, ur 100 (*)    All other components within  normal limits  LACTIC ACID, PLASMA - Abnormal; Notable for the following components:   Lactic Acid, Venous 4.2 (*)    All other components within normal limits  LACTIC ACID, PLASMA - Abnormal; Notable for the following components:   Lactic Acid, Venous 2.4 (*)    All other components within normal limits  COMPREHENSIVE METABOLIC PANEL - Abnormal; Notable for the following components:   CO2 20 (*)    BUN 31 (*)    Calcium 8.7 (*)    Albumin  3.3 (*)    AST 43 (*)    Total Bilirubin 1.8 (*)    All other components within normal limits  CBC WITH DIFFERENTIAL/PLATELET - Abnormal; Notable for the following components:   RBC 4.01 (*)    Hemoglobin 9.5 (*)    HCT 31.7 (*)    MCV 79.1 (*)    MCH 23.7 (*)    RDW 19.6 (*)    Platelets 102 (*)    Lymphs Abs 0.1 (*)    All other components within normal limits  APTT - Abnormal; Notable for the following components:   aPTT 42 (*)    All other components within normal limits  PROTIME-INR - Abnormal; Notable for the following components:   Prothrombin Time 23.2 (*)    INR 2.1 (*)    All other components within normal limits  URINALYSIS, MICROSCOPIC (REFLEX) - Abnormal; Notable for the following components:   Bacteria, UA MANY (*)    All other components within normal limits  BRAIN NATRIURETIC PEPTIDE - Abnormal; Notable for the following components:   B Natriuretic Peptide 2,352.4 (*)    All other components within normal limits  TROPONIN I (HIGH SENSITIVITY) - Abnormal; Notable for the following components:   Troponin I (High Sensitivity) 98 (*)    All other components within normal limits  TROPONIN I (HIGH SENSITIVITY) - Abnormal; Notable for the following components:   Troponin I (High Sensitivity) 105 (*)    All other components within normal limits  SARS CORONAVIRUS 2 BY RT PCR (HOSPITAL ORDER, PERFORMED IN [facility])  CULTURE, BLOOD (ROUTINE X 2)  CULTURE, BLOOD (ROUTINE X 2)  URINE CULTURE  URINALYSIS, ROUTINE W  REFLEX MICROSCOPIC    EKG EKG Interpretation  Date/Time:  Tuesday May 11 2020 07:28:46 EDT Ventricular Rate:  88 PR Interval:    QRS Duration: 100 QT Interval:  380 QTC Calculation: 460 R Axis:   81 Text Interpretation: Sinus rhythm Multiform ventricular premature complexes Borderline right axis deviation Probable LVH with secondary repol abnrm No STEMI Confirmed by Alvester Chou [phone]) on 05/11/2020 12:29:48 PM   Radiology DG Chest Port 1 View  Result Date: 05/11/2020 CLINICAL DATA:  Sepsis workup. EXAM: PORTABLE CHEST 1 VIEW COMPARISON:  04/14/2014 chest and abdominal radiographs. FINDINGS: Bibasilar patchy opacities. A rounded opacity is also seen with lateral right lung base. No pneumothorax. Small right greater than left pleural effusions. Cardiomegaly. Post sternotomy sequela. Telemetry wires overlie the chest. Osteopenia.  Multilevel spondylosis. IMPRESSION: Patchy bibasilar opacities, infection versus atelectasis. Rounded lateral right basilar opacity may reflect a nodule versus rounded atelectasis. Consider CT chest for further evaluation. Cardiomegaly.  Small bilateral pleural effusions. Electronically Signed   By: Stana Bunting M.D.   On: 05/11/2020 08:06    Procedures .Critical Care Performed by: Terald Sleeper, MD Authorized by: Terald Sleeper, MD   Critical care provider statement:    Critical care time (minutes):  45   Critical care was necessary to treat or prevent imminent or life-threatening deterioration of the following conditions:  Sepsis   Critical care was time spent personally by me on the following activities:  Discussions with consultants, evaluation of patient's response to treatment, examination of patient, ordering and performing treatments and interventions, ordering and review of laboratory studies, ordering and review of radiographic studies, pulse oximetry, re-evaluation of patient's condition, obtaining history from patient or surrogate  and review of old charts   (including critical care time)  Medications Ordered in ED Medications  0.9 %  sodium chloride infusion ( Intravenous Stopped 05/11/20 1310)  sodium chloride 0.9 % bolus 1,000 mL (0 mLs Intravenous Stopped 05/11/20 0906)    And  sodium chloride 0.9 % bolus 1,000 mL (0 mLs Intravenous Stopped 05/11/20 0926)  acetaminophen (TYLENOL) tablet 650 mg (650 mg Oral Given 05/11/20 0803)  cefTRIAXone (ROCEPHIN) 1 g in sodium chloride 0.9 % 100 mL IVPB (0 g Intravenous Stopped 05/11/20 1143)  azithromycin (ZITHROMAX) 500 mg in sodium chloride 0.9 % 250 mL IVPB (0 mg Intravenous Stopped 05/11/20 1310)    ED Course  I have reviewed the triage vital signs and the nursing notes.  Pertinent labs & imaging results that were available during my care of the patient were reviewed by me and considered in my medical decision making (see chart for details).  74 yo male presenting to ED with cough, fatigue for several days, low-energy.  Sepsis alert on arrival with rectal temp 102.F, lactate > 4.  Does not appear to be in shock.  DDx includes CHF exacerbation vs PNA vs COVID viral illness vs ACS vs other  Additional history provided by daughter Carlos Welch by phone and at bedside I personally reviewed his labs notable for elevated lactate, elevated troponin and BNP I personally reviewed his xray showing possible PNA/consolidation Covid test was negative here.  He is not vaccinated  Following initial workup I began treatment for sepsis PNA as likely source with IV rocephin and azithromycin.   UA negative for infection Abdominal exam benign - unlikely source of fever Gave 1L total of IVF - 2 liters had been ordered, but I held the 2nd after noting his significantly elevated BNP and unclear CHF history.  His lactate was improving.  His prior medical records appear to be from [facility] system per his daughter's report.   Clinical Course as of May 11 1426  Tue May 11, 2020  0740 No response from daughter Carlos Welch at number listed   [MT]  0825 UA without clear signs of infection, xray suggestive of PNA but may also be COVID pattern - will await covid result prior to initiating antibiotics   [MT]  [phone] Patient became quite angry with me and staff when told that I advised hospitalization.  He screamed "I don't want to go back into the damn hospital, just let me go home and die."  I called his daughter who tells me she is on her way in.   [MT]  17 Patient's daughter is now present at the bedside.  After our discussion with the patient, he is now agreeable to stay in the hospital for sepsis pneumonia evaluation and IV antibiotics.  He likely need another echocardiogram as his cardiac hx is unclear, his BNP is quite elevated.  I paused his IV fluids after 1L given his CHF history.  He remains 99% on room air and not in respiratory distress.   [MT]  1044 He and his daughter are okay with giving small doses of ativan if needed to help him relax, as she tells me he gets "jittery and anxious being in a room all day and wants to walk everywhere."  For now he appears comfortable in bed   [MT]  1101 Signed out to Dr Dairl Ponder the hospitalist.  No beds available for med tele at [facility] or [facility] right now, hopefully we will have some later today   [MT]  1207 Improved lactate, minor elevation of troponin to 105, likely demand ischemia.  He has no chest pain now.  Hold off on additional fluids.   [MT]    Clinical Course User Index [MT] Bairon Klemann, Kermit BaloMatthew J, MD    Final Clinical Impression(s) / ED Diagnoses Final diagnoses:  Sepsis, due to unspecified organism, unspecified whether acute organ dysfunction present [facility](HCC)  Community acquired pneumonia, unspecified laterality    Rx / DC Orders ED Discharge Orders    None       Cane Dubray, Kermit BaloMatthew J, MD 05/11/20 1706

## 2020-05-11 NOTE — H&P (Signed)
History and Physical    [name] YTK:[id number] DOB: 09-03-1946 DOA: 05/11/2020  PCP: Patient, No Pcp Per   Patient coming from: Home.  I have personally briefly reviewed patient's old medical records in [facility]  Chief Complaint: Progressive shortness of breath.  HPI: Carlos Welch is a 74 y.o. male with medical history significant of   Carlos Welch is a 57 Male with PMH of CHF, atrial fibrillation on Eliquis, poor outpatient follow-ups, and left against medical advice multiple times with unknown remaining history,  no records on file who presents with several day history of shortness of breath, malaise, fever. The patient reports he originally lives in [place] where he receives his medical care, but has recently moved to [place] to live with his daughter.    He reports prolonged hospitalization in [facility] and then was transferred to [facility].  His daughter reports to the ED physician that patient had prosthetic valve replacement 6 years ago and then after that he never followed up with his doctors.  He has recurrent hospitalization for CHF exacerbation but then he ended up being signing against medical advice, patient reports orthopnea, exertional dyspnea with bilateral leg swelling.  He self titrates his Lasix medication.  He reports tmax of 102.F at home earlier today. He was admitted from med [facility] for sepsis secondary to community-acquired pneumonia and CHF exacerbation.   ED Course: He was febrile temperature 100.3, respiratory rate 21, blood pressure 119/ 88, SPO2 100%.   Labs: CMP sodium 138 potassium 3.9 chloride 103 bicarb 20 BUN 31 creatinine 1.20 glucose 92 calcium 8.7, albumin 3.3 AST 43 ALT 20 total bilirubin 1.0, BNP 2352.4, troponin 98, 105, lactic acid 4.2, 2.4, CBC WBC 6.8, hemoglobin 9.5, hematocrit 31.7, platelet 102, PT 23.2, INR 2.1,  CXR: Patchy bibasilar opacities, infection versus atelectasis.  Rounded lateral right basilar opacity may reflect a nodule versus rounded atelectasis. Consider CT chest for further evaluation.Cardiomegaly.  Small bilateral pleural effusions.  Patient was given IV fluids, lactic acid improved from 4.2-2.4, hospitalist was called to admit this patient.   Review of Systems: As per HPI otherwise 10 point review of systems negative.  Review of Systems  Constitutional: Positive for chills and fever.  HENT: Negative.   Eyes: Negative.   Respiratory: Positive for shortness of breath.   Cardiovascular: Positive for leg swelling.  Gastrointestinal: Negative.   Genitourinary: Negative.   Musculoskeletal: Negative.   Neurological: Negative.   Psychiatric/Behavioral: Negative.     Past Medical History:  Diagnosis Date  . A-fib (HCC)   . CHF (congestive heart failure) (HCC)   . Hypertension     Past Surgical History:  Procedure Laterality Date  . HERNIA REPAIR    . MITRAL VALVE REPLACEMENT       reports that he has quit smoking. He does not have any smokeless tobacco history on file. He reports that he does not drink alcohol and does not use drugs.  No Known Allergies  No family history on file.  Family history reviewed and not pertinent.  Prior to Admission medications   Medication Sig Start Date End Date Taking? Authorizing Provider  apixaban (ELIQUIS) 5 MG TABS tablet Take 5 mg by mouth in the morning and at bedtime.   Yes [provider]  atorvastatin (LIPITOR) 80 MG tablet Take 80 mg by mouth daily.   Yes [provider]  carvedilol (COREG) 6.25 MG tablet Take 6.25 mg by mouth 2 (  two) times daily with a meal.   Yes [provider]  furosemide (LASIX) 40 MG tablet Take 40 mg by mouth daily.    Yes [provider]  Ginkgo Biloba 40 MG TABS Take 1 tablet by mouth daily.   Yes [provider]  HEMP OIL-VANILLYL BUTYL ETHER EX Apply 1 application topically daily.   Yes [provider]    Multiple Vitamin (MULTIVITAMIN WITH MINERALS) TABS tablet Take 1 tablet by mouth daily.   Yes [provider]  potassium chloride SA (KLOR-CON) 20 MEQ tablet Take 20 mEq by mouth daily.   Yes [provider]  tamsulosin (FLOMAX) 0.4 MG CAPS capsule Take 0.4 mg by mouth daily.   Yes [provider]  TURMERIC PO Take 1 tablet by mouth daily.   Yes [provider]    Physical Exam: Vitals:   05/11/20 1444 05/11/20 1500 05/11/20 1530 05/11/20 1624  BP:  (!) 108/91 (!) 127/92 (!) 114/86  Pulse:  76 76 78  Resp:  21 18 (!) 24  Temp: 99.3 F (37.4 C)   (!) 97.5 F (36.4 C)  TempSrc: Rectal   Oral  SpO2:  100% 100% 99%  Weight:      Height:        Constitutional: NAD, calm, comfortable Vitals:   05/11/20 1444 05/11/20 1500 05/11/20 1530 05/11/20 1624  BP:  (!) 108/91 (!) 127/92 (!) 114/86  Pulse:  76 76 78  Resp:  21 18 (!) 24  Temp: 99.3 F (37.4 C)   (!) 97.5 F (36.4 C)  TempSrc: Rectal   Oral  SpO2:  100% 100% 99%  Weight:      Height:       Eyes: PERRL, lids and conjunctivae normal ENMT: Mucous membranes are moist. Posterior pharynx clear of any exudate or lesions.Normal dentition.  Neck: normal, supple, no masses, no thyromegaly Respiratory: clear to auscultation bilaterally, no wheezing, no crackles. Normal respiratory effort. No accessory muscle use.  Cardiovascular: Regular rate and rhythm, no murmurs / rubs / gallops.  Bilateral extremity edema. 2+ pedal pulses. No carotid bruits.  Abdomen: no tenderness, no masses palpated. No hepatosplenomegaly. Bowel sounds positive.  Musculoskeletal: no clubbing / cyanosis. No joint deformity upper and lower extremities. Good ROM, no contractures. Normal muscle tone.  Skin: no rashes, lesions, ulcers. No induration Neurologic: CN 2-12 grossly intact. Sensation intact, DTR normal. Strength 5/5 in all 4.  Psychiatric: Normal judgment and insight. Alert and oriented x 3. Normal mood.   Labs on  Admission: I have personally reviewed following labs and imaging studies  CBC: Recent Labs  Lab 05/11/20 0736  WBC 6.8  NEUTROABS 6.4  HGB 9.5*  HCT 31.7*  MCV 79.1*  PLT 102*   Basic Metabolic Panel: Recent Labs  Lab 05/11/20 0736  NA 138  K 3.9  CL 103  CO2 20*  GLUCOSE 92  BUN 31*  CREATININE 1.20  CALCIUM 8.7*   GFR: Estimated Creatinine Clearance: 57.5 mL/min (by C-G formula based on SCr of 1.2 mg/dL). Liver Function Tests: Recent Labs  Lab 05/11/20 0736  AST 43*  ALT 20  ALKPHOS 94  BILITOT 1.8*  PROT 6.7  ALBUMIN 3.3*   No results for input(s): LIPASE, AMYLASE in the last 168 hours. No results for input(s): AMMONIA in the last 168 hours. Coagulation Profile: Recent Labs  Lab 05/11/20 0736  INR 2.1*   Cardiac Enzymes: No results for input(s): CKTOTAL, CKMB, CKMBINDEX, TROPONINI in the last 168 hours.  BNP (last 3 results) No results for input(s): PROBNP in the last 8760 hours. HbA1C: No results for input(s): HGBA1C in the last 72 hours. CBG: No results for input(s): GLUCAP in the last 168 hours. Lipid Profile: No results for input(s): CHOL, HDL, LDLCALC, TRIG, CHOLHDL, LDLDIRECT in the last 72 hours. Thyroid Function Tests: No results for input(s): TSH, T4TOTAL, FREET4, T3FREE, THYROIDAB in the last 72 hours. Anemia Panel: No results for input(s): VITAMINB12, FOLATE, FERRITIN, TIBC, IRON, RETICCTPCT in the last 72 hours. Urine analysis:    Component Value Date/Time   COLORURINE AMBER (A) 05/11/2020 0735   APPEARANCEUR CLEAR 05/11/2020 0735   LABSPEC >1.030 (H) 05/11/2020 0735   PHURINE 5.0 05/11/2020 0735   GLUCOSEU NEGATIVE 05/11/2020 0735   HGBUR NEGATIVE 05/11/2020 0735   BILIRUBINUR SMALL (A) 05/11/2020 0735   KETONESUR 15 (A) 05/11/2020 0735   PROTEINUR 100 (A) 05/11/2020 0735   UROBILINOGEN 0.2 04/14/2014 1108   NITRITE NEGATIVE 05/11/2020 0735   LEUKOCYTESUR NEGATIVE 05/11/2020 0735    Radiological Exams on Admission: DG Chest  Port 1 View  Result Date: 05/11/2020 CLINICAL DATA:  Sepsis workup. EXAM: PORTABLE CHEST 1 VIEW COMPARISON:  04/14/2014 chest and abdominal radiographs. FINDINGS: Bibasilar patchy opacities. A rounded opacity is also seen with lateral right lung base. No pneumothorax. Small right greater than left pleural effusions. Cardiomegaly. Post sternotomy sequela. Telemetry wires overlie the chest. Osteopenia.  Multilevel spondylosis. IMPRESSION: Patchy bibasilar opacities, infection versus atelectasis. Rounded lateral right basilar opacity may reflect a nodule versus rounded atelectasis. Consider CT chest for further evaluation. Cardiomegaly.  Small bilateral pleural effusions. Electronically Signed   By: Stana Bunting M.D.   On: 05/11/2020 08:06    EKG: Independently reviewed.  Sinus rhythm multiform ventricular premature complexes.  LVH with secondary repolarization.  Assessment/Plan Principal Problem:   Sepsis (HCC) Active Problems:   Community acquired pneumonia   Acute CHF (congestive heart failure) (HCC)   Atrial fibrillation, chronic (HCC)   Sepsis secondary to community-acquired pneumonia Spsis present on admission, temp 102.6, respiratory rate 24, lactic acid 4.2,.   Chest x-ray rounded lateral basilar opacity may reflect nodule or rounded atelectasis. Start ceftriaxone and Zithromax for community-acquired pneumonia. Follow blood cultures urine culture. Obtain CT chest for further clarification.  Acute shortness of breath could be secondary to CHF exacerbation BNP 2354, chest x-ray shows cardiomegaly Obtain 2D echocardiogram Continue Lasix 40 mg IV daily.  Elevated troponin could be secondary to CHF exacerbation: Continue to trend troponin, consider cardiology consult Obtain 2D echo  Thrombocytopenia No previous labs to compare baseline platelet count. Recheck labs tomorrow.  Chronic anemia : He denies any GI bleed, hemoptysis.  Will obtain a stool for occult blood.  A.  fib with normal ventricular rate heart rate controlled, continue Coreg. Continue Eliquis for anticoagulation,    DVT prophylaxis: Apixaban Code Status: Full code Family Communication: No one at bedside, discussed with patient in detail Disposition Plan:   Disposition: Patient is from home DC: Home with home PT versus SNF Patient is not medically clear, pending work-up   Consults called: Cardiology Admission status: Admitted inpatient   Cipriano Bunker MD [facility]   If 7PM-7AM, please contact night-coverage [email or web address]   05/11/2020, 5:40 PM

## 2020-05-11 NOTE — Progress Notes (Addendum)
Pt calls nursing staff to room with complaints of significant stinging sensation all over his body (no specific region). Assessment unremarkable. When asked if he has ever felt this way before he stated one other time and that the solution was morphine.   Paged on call M. Katherina Right making aware of the situation and request from patient.    05/12/20 - 0030 - No new orders. Pt fell asleep shortly after complaint.

## 2020-05-11 NOTE — ED Notes (Signed)
Report called to [facility] RN at [facility], for room 1423

## 2020-05-12 ENCOUNTER — Inpatient Hospital Stay (HOSPITAL_COMMUNITY): Payer: Medicare Other

## 2020-05-12 ENCOUNTER — Encounter (HOSPITAL_COMMUNITY): Payer: Self-pay | Admitting: Family Medicine

## 2020-05-12 DIAGNOSIS — I2510 Atherosclerotic heart disease of native coronary artery without angina pectoris: Secondary | ICD-10-CM

## 2020-05-12 DIAGNOSIS — A419 Sepsis, unspecified organism: Secondary | ICD-10-CM | POA: Diagnosis not present

## 2020-05-12 DIAGNOSIS — R579 Shock, unspecified: Secondary | ICD-10-CM

## 2020-05-12 DIAGNOSIS — I482 Chronic atrial fibrillation, unspecified: Secondary | ICD-10-CM | POA: Diagnosis not present

## 2020-05-12 DIAGNOSIS — L8990 Pressure ulcer of unspecified site, unspecified stage: Secondary | ICD-10-CM | POA: Insufficient documentation

## 2020-05-12 DIAGNOSIS — R1084 Generalized abdominal pain: Secondary | ICD-10-CM

## 2020-05-12 DIAGNOSIS — Z952 Presence of prosthetic heart valve: Secondary | ICD-10-CM

## 2020-05-12 DIAGNOSIS — N179 Acute kidney failure, unspecified: Secondary | ICD-10-CM

## 2020-05-12 DIAGNOSIS — R6520 Severe sepsis without septic shock: Secondary | ICD-10-CM

## 2020-05-12 DIAGNOSIS — I340 Nonrheumatic mitral (valve) insufficiency: Secondary | ICD-10-CM

## 2020-05-12 DIAGNOSIS — I5041 Acute combined systolic (congestive) and diastolic (congestive) heart failure: Secondary | ICD-10-CM | POA: Diagnosis not present

## 2020-05-12 DIAGNOSIS — I361 Nonrheumatic tricuspid (valve) insufficiency: Secondary | ICD-10-CM

## 2020-05-12 HISTORY — DX: Pressure ulcer of unspecified site, unspecified stage: L89.90

## 2020-05-12 LAB — CBC
HCT: 29.8 % — ABNORMAL LOW (ref 39.0–52.0)
HCT: 30.6 % — ABNORMAL LOW (ref 39.0–52.0)
Hemoglobin: 8.6 g/dL — ABNORMAL LOW (ref 13.0–17.0)
Hemoglobin: 8.9 g/dL — ABNORMAL LOW (ref 13.0–17.0)
MCH: 23.6 pg — ABNORMAL LOW (ref 26.0–34.0)
MCH: 23.9 pg — ABNORMAL LOW (ref 26.0–34.0)
MCHC: 28.9 g/dL — ABNORMAL LOW (ref 30.0–36.0)
MCHC: 29.1 g/dL — ABNORMAL LOW (ref 30.0–36.0)
MCV: 81.9 fL (ref 80.0–100.0)
MCV: 82 fL (ref 80.0–100.0)
Platelets: 99 10*3/uL — ABNORMAL LOW (ref 150–400)
Platelets: 109 10*3/uL — ABNORMAL LOW (ref 150–400)
RBC: 3.64 MIL/uL — ABNORMAL LOW (ref 4.22–5.81)
RBC: 3.73 MIL/uL — ABNORMAL LOW (ref 4.22–5.81)
RDW: 19.4 % — ABNORMAL HIGH (ref 11.5–15.5)
RDW: 19.3 % — ABNORMAL HIGH (ref 11.5–15.5)
WBC: 5.9 10*3/uL (ref 4.0–10.5)
WBC: 6.1 10*3/uL (ref 4.0–10.5)
nRBC: 0 % (ref 0.0–0.2)
nRBC: 0 % (ref 0.0–0.2)

## 2020-05-12 LAB — COMPREHENSIVE METABOLIC PANEL
ALT: 31 U/L (ref 0–44)
ALT: 32 U/L (ref 0–44)
AST: 48 U/L — ABNORMAL HIGH (ref 15–41)
AST: 46 U/L — ABNORMAL HIGH (ref 15–41)
Albumin: 2.8 g/dL — ABNORMAL LOW (ref 3.5–5.0)
Albumin: 3 g/dL — ABNORMAL LOW (ref 3.5–5.0)
Alkaline Phosphatase: 76 U/L (ref 38–126)
Alkaline Phosphatase: 86 U/L (ref 38–126)
Anion gap: 10 (ref 5–15)
Anion gap: 8 (ref 5–15)
BUN: 40 mg/dL — ABNORMAL HIGH (ref 8–23)
BUN: 40 mg/dL — ABNORMAL HIGH (ref 8–23)
CO2: 20 mmol/L — ABNORMAL LOW (ref 22–32)
CO2: 22 mmol/L (ref 22–32)
Calcium: 8.1 mg/dL — ABNORMAL LOW (ref 8.9–10.3)
Calcium: 7.9 mg/dL — ABNORMAL LOW (ref 8.9–10.3)
Chloride: 107 mmol/L (ref 98–111)
Chloride: 107 mmol/L (ref 98–111)
Creatinine, Ser: 1.04 mg/dL (ref 0.61–1.24)
Creatinine, Ser: 0.96 mg/dL (ref 0.61–1.24)
GFR calc Af Amer: >60 mL/min (ref >60)
GFR calc Af Amer: >60 mL/min (ref >60)
GFR calc non Af Amer: >60 mL/min (ref >60)
GFR calc non Af Amer: >60 mL/min (ref >60)
Glucose, Bld: 101 mg/dL — ABNORMAL HIGH (ref 70–99)
Glucose, Bld: 157 mg/dL — ABNORMAL HIGH (ref 70–99)
Potassium: 3.3 mmol/L — ABNORMAL LOW (ref 3.5–5.1)
Potassium: 3.4 mmol/L — ABNORMAL LOW (ref 3.5–5.1)
Sodium: 137 mmol/L (ref 135–145)
Sodium: 137 mmol/L (ref 135–145)
Total Bilirubin: 1 mg/dL (ref 0.3–1.2)
Total Bilirubin: 1 mg/dL (ref 0.3–1.2)
Total Protein: 5.7 g/dL — ABNORMAL LOW (ref 6.5–8.1)
Total Protein: 6.2 g/dL — ABNORMAL LOW (ref 6.5–8.1)

## 2020-05-12 LAB — PROTIME-INR
INR: 2.1 — ABNORMAL HIGH (ref 0.8–1.2)
Prothrombin Time: 23.2 seconds — ABNORMAL HIGH (ref 11.4–15.2)

## 2020-05-12 LAB — LACTIC ACID, PLASMA
Lactic Acid, Venous: 3.4 mmol/L (ref 0.5–1.9)
Lactic Acid, Venous: 2.8 mmol/L (ref 0.5–1.9)

## 2020-05-12 LAB — APTT: aPTT: 36 seconds (ref 24–36)

## 2020-05-12 LAB — ECHOCARDIOGRAM COMPLETE
AR max vel: 1.39 cm2
AV Area VTI: 1.39 cm2
AV Area mean vel: 1.39 cm2
AV Mean grad: 10 mmHg
AV Peak grad: 18.7 mmHg
Ao pk vel: 2.16 m/s
Area-P 1/2: 3.65 cm2
Height: 74 in
S' Lateral: 3.9 cm
Weight: 2579.2 oz

## 2020-05-12 LAB — GLUCOSE, CAPILLARY: Glucose-Capillary: 148 mg/dL — ABNORMAL HIGH (ref 70–99)

## 2020-05-12 LAB — TROPONIN I (HIGH SENSITIVITY): Troponin I (High Sensitivity): 57 ng/L — ABNORMAL HIGH (ref <18)

## 2020-05-12 MED ORDER — SODIUM CHLORIDE 0.9 % IV SOLN
1.0000 g | INTRAVENOUS | Status: DC
  Administered 2020-05-12: 1 g via INTRAVENOUS
  Filled 2020-05-12: qty 1

## 2020-05-12 MED ORDER — IOHEXOL 9 MG/ML PO SOLN
ORAL | Status: AC
  Filled 2020-05-12: qty 1000

## 2020-05-12 MED ORDER — SODIUM CHLORIDE 0.9 % IV SOLN
2.0000 g | Freq: Three times a day (TID) | INTRAVENOUS | Status: DC
  Administered 2020-05-12 – 2020-05-17 (×14): 2 g via INTRAVENOUS
  Filled 2020-05-12 (×14): qty 2

## 2020-05-12 MED ORDER — VANCOMYCIN HCL 1500 MG/300ML IV SOLN
1500.0000 mg | Freq: Once | INTRAVENOUS | Status: AC
  Administered 2020-05-12: 1500 mg via INTRAVENOUS
  Filled 2020-05-12: qty 300

## 2020-05-12 MED ORDER — VANCOMYCIN HCL IN DEXTROSE 1-5 GM/200ML-% IV SOLN
1000.0000 mg | Freq: Two times a day (BID) | INTRAVENOUS | Status: DC
  Administered 2020-05-13 – 2020-05-16 (×7): 1000 mg via INTRAVENOUS
  Filled 2020-05-12 (×7): qty 200

## 2020-05-12 MED ORDER — IOHEXOL 300 MG/ML  SOLN
100.0000 mL | Freq: Once | INTRAMUSCULAR | Status: AC | PRN
  Administered 2020-05-12: 100 mL via INTRAVENOUS

## 2020-05-12 MED ORDER — MORPHINE SULFATE (PF) 2 MG/ML IV SOLN
1.0000 mg | INTRAVENOUS | Status: DC | PRN
  Administered 2020-05-12: 2 mg via INTRAVENOUS
  Administered 2020-05-12 – 2020-05-13 (×3): 1 mg via INTRAVENOUS
  Administered 2020-05-14 – 2020-05-15 (×4): 2 mg via INTRAVENOUS
  Administered 2020-05-16 – 2020-05-17 (×3): 1 mg via INTRAVENOUS
  Filled 2020-05-12 (×11): qty 1

## 2020-05-12 MED ORDER — POTASSIUM CHLORIDE CRYS ER 20 MEQ PO TBCR
40.0000 meq | EXTENDED_RELEASE_TABLET | Freq: Every day | ORAL | Status: DC
  Administered 2020-05-12: 40 meq via ORAL
  Filled 2020-05-12: qty 2

## 2020-05-12 MED ORDER — NOREPINEPHRINE 4 MG/250ML-% IV SOLN
2.0000 ug/min | INTRAVENOUS | Status: DC
  Administered 2020-05-12: 2 ug/min via INTRAVENOUS
  Filled 2020-05-12: qty 250

## 2020-05-12 MED ORDER — SODIUM CHLORIDE 0.9 % IV SOLN
500.0000 mg | INTRAVENOUS | Status: DC
  Administered 2020-05-12: 500 mg via INTRAVENOUS
  Filled 2020-05-12: qty 500

## 2020-05-12 MED ORDER — CHLORHEXIDINE GLUCONATE CLOTH 2 % EX PADS
6.0000 | MEDICATED_PAD | Freq: Every day | CUTANEOUS | Status: DC
  Administered 2020-05-12 – 2020-05-16 (×4): 6 via TOPICAL

## 2020-05-12 NOTE — Progress Notes (Signed)
PT Cancellation Note  Patient Details Name: Carlos Welch MRN: [id number] DOB: 1946/02/12   Cancelled Treatment:    Reason Eval/Treat Not Completed: Patient at procedure or test/unavailable (pt having cardiac ultrasound. Will follow.)  Tamala Ser PT 05/12/2020  Acute Rehabilitation Services Pager [phone] Office [phone]

## 2020-05-12 NOTE — Plan of Care (Signed)
Received page that patient's BP 64/50, appears diaphoretic with severe abdominal pain.  Patient evaluated, patient reports pain started after he ate spaghetti, RRT was called.  Patient is given IV bolus 1 L, state EKG shows atrial fibrillation, stat labs ordered.  Stat CT abdomen with contrast.  ICU evaluation requested.  Official echo report pending but concerning for Abscess, cardiology is planning for TEE tomorrow. Patient accepted in ICU.

## 2020-05-12 NOTE — H&P (View-Only) (Signed)
Cardiology Consultation:   Patient ID: Carlos Welch MRN: [id number]; DOB: 10-31-1945  Admit date: 05/11/2020 Date of Consult: 05/12/2020  Primary Care Provider: Patient, No Pcp Per [facility] Cardiologist: Thurmon Fair, MD (new)  [facility] Electrophysiologist:  None    Patient Profile:   Carlos Welch is a 74 y.o. 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 Lucianne Muss.  History of Present Illness:   The patient is a very poor historian with some confabulation noted, very vague answers, states "I already told 10,000 people this, I'm not gonna tell it again." He was originally from [place]. He says he's been here in [place] since 1990 but this does not corroborate with the chart.  He does report a h/o valve surgery at [facility] 9 years ago. Chart reviewed from [facility] which indicates that patient actually has a history of CAD and aortic stenosis s/p CABG and bioprosthetic AVR with LAA clipping in 2015 at [facility]. He reportedly had a TEE in 08/2019 showing EF 35-40%. Chart also outlines h/o afib on Eliquis and subacute bacterial endocarditis. Per Care Everywhere, in May 2021, he was transferred from OSH to [facility] with abscess of the jaw (prior chin implant) and MRSA bacteremia. Per notes "ID was consulted, with recommendations to obtain a TTE (6/1), TEE (6/4) which showed no vegetations on AV and no regurgitation. TEE did show severely enlarged LA." It is not mentioned what his LVEF was in any of the notes and I cannot access those results in CareEverywhere. There is an automated field that pulled in EF 67% on TTE on 03/16/20. There is also commetary  that there was no periprosthetic regurgitation. He was sent home on IV vancomycin to continue through 04/11/20.  It is not known currently if his atrial fib is paroxysmal or chronic. It is mentioned as chronic in one of the medication indications in [facility] records, but exams reporting RRR.  ED note indicates when speaking with the daughter he has a long history of noncompliance with medical treatment and follow-up, frequent admissions for CHF and leaving AMA. His daughter brought him to her home in July 2021. Daughter is not present at bedside today.  He reportedly presented to the hospital with generalized body aches. He denies any CP. He reports some intermittent SOB and nausea but otherwise reports he's irritated and doesn't want to answer questions. He was febrile of 102.6 on arrival with mildly elevated BP, since normotensive. Labs show BNP 2352, hsTroponin 98->105, microcytic anemia down to 8.6 with thrombocytopenia of 99, mildly abnormal AST/bilirubin, BUN 31->40 and Cr 1.20->1.04, initial lactic acid 4.2, UA specific gravity elevated. CXR with patchy bibasilar opacities, infection vs atelectasis, rounded lateral right basilar opacity (CT chest pending), small bilateral effusions and cardiomegaly. 2D echo pending.  Past Medical History:  Diagnosis Date  . A-fib (HCC)   . Aortic stenosis    a. s/p pericardial AVR 2015.  . Bacterial endocarditis   . CAD (coronary artery disease)    a. s/p CABG, AVR, LAA clipping 2015 at [facility].  . Chronic combined systolic and diastolic CHF (congestive heart failure) (HCC)   . History of noncompliance with medical treatment   .  Hypertension   . Malnutrition (HCC)   . MRSA bacteremia   . 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 TIA (transient ischemic attack)   . Unintentional weight loss     Past Surgical History:  Procedure Laterality Date  . AORTIC VALVE REPLACEMENT    . HERNIA REPAIR       Home Medications:  Prior to  Admission medications   Medication Sig Start Date End Date Taking? Authorizing Provider  apixaban (ELIQUIS) 5 MG TABS tablet Take 5 mg by mouth in the morning and at bedtime.   Yes [provider]  atorvastatin (LIPITOR) 80 MG tablet Take 80 mg by mouth daily.   Yes [provider]  carvedilol (COREG) 6.25 MG tablet Take 6.25 mg by mouth 2 (two) times daily with a meal.   Yes [provider]  furosemide (LASIX) 40 MG tablet Take 40 mg by mouth daily.    Yes [provider]  Ginkgo Biloba 40 MG TABS Take 1 tablet by mouth daily.   Yes [provider]  HEMP OIL-VANILLYL BUTYL ETHER EX Apply 1 application topically daily.   Yes [provider]  Multiple Vitamin (MULTIVITAMIN WITH MINERALS) TABS tablet Take 1 tablet by mouth daily.   Yes [provider]  potassium chloride SA (KLOR-CON) 20 MEQ tablet Take 20 mEq by mouth daily.   Yes [provider]  tamsulosin (FLOMAX) 0.4 MG CAPS capsule Take 0.4 mg by mouth daily.   Yes [provider]  TURMERIC PO Take 1 tablet by mouth daily.   Yes [provider]    Inpatient Medications: Scheduled Meds: . apixaban  5 mg Oral BID  . atorvastatin  80 mg Oral Daily  . carvedilol  6.25 mg Oral BID WC  . docusate sodium  100 mg Oral BID  . furosemide  40 mg Oral Daily  . tamsulosin  0.4 mg Oral Daily   Continuous Infusions: . sodium chloride Stopped (05/11/20 1310)  . azithromycin    . cefTRIAXone (ROCEPHIN)  IV 1 g (05/12/20 1115)   PRN Meds: sodium chloride, acetaminophen **OR** acetaminophen, albuterol, hydrALAZINE, ondansetron **OR** ondansetron (ZOFRAN) IV, polyethylene glycol  Allergies:   No Known Allergies  Social History:   Social History   Socioeconomic History  . Marital status: Legally Separated    Spouse name: Not on file  . Number of children: Not on file  . Years of education: Not on file  . Highest education level: Not on file    Occupational History  . Not on file  Tobacco Use  . Smoking status: Former Smoker  Substance and Sexual Activity  . Alcohol use: No  . Drug use: No  . Sexual activity: Not on file  Other Topics Concern  . Not on file  Social History Narrative  . Not on file   Social Determinants of Health   Financial Resource Strain:   . Difficulty of Paying Living Expenses:   Food Insecurity:   . Worried About Programme researcher, broadcasting/film/videounning Out of Food in the Last Year:   . Baristaan Out of Food in the Last Year:   Transportation Needs:   . Freight forwarderLack of Transportation (Medical):   Marland Kitchen. Lack of Transportation (Non-Medical):   Physical Activity:   . Days of Exercise per Week:   . Minutes of Exercise per Session:   Stress:   . Feeling of Stress :   Social Connections:   . Frequency of Communication with Friends and Family:   .  Frequency of Social Gatherings with Friends and Family:   . Attends Religious Services:   . Active Member of Clubs or Organizations:   . Attends Banker Meetings:   Marland Kitchen Marital Status:   Intimate Partner Violence:   . Fear of Current or Ex-Partner:   . Emotionally Abused:   Marland Kitchen Physically Abused:   . Sexually Abused:     Family History:    Family History  Problem Relation Age of Onset  . CAD Neg Hx      ROS:  Patient declines to engage in full ROS  Physical Exam/Data:   Vitals:   05/11/20 1530 05/11/20 1624 05/11/20 2228 05/12/20 0459  BP: (!) 127/92 (!) 114/86 105/73 106/84  Pulse: 76 78 76 74  Resp: 18 (!) 24 (!) 24 20  Temp:  (!) 97.5 F (36.4 C) 97.7 F (36.5 C) 97.7 F (36.5 C)  TempSrc:  Oral Oral Oral  SpO2: 100% 99% 99% 100%  Weight:      Height:        Intake/Output Summary (Last 24 hours) at 05/12/2020 1148 Last data filed at 05/12/2020 0456 Gross per 24 hour  Intake 254.11 ml  Output 425 ml  Net -170.89 ml   Last 3 Weights 05/11/2020 04/14/2014  Weight (lbs) 161 lb 3.2 oz 183 lb  Weight (kg) 73.12 kg 83.008 kg     Body mass index is 20.7 kg/m.   General: Frail dissheveled appearing WM in no acute distress. Head: Normocephalic, atraumatic, sclera non-icteric, no xanthomas, nares are without discharge. Poor dentition Neck: Negative for carotid bruits. JVP not elevated. Lungs: Clear bilaterally to auscultation without wheezes, rales, or rhonchi. Breathing is unlabored. Heart: Irregularly irregular, rate controlled, without murmurs, rubs, or gallops.  Abdomen: Soft, non-tender, non-distended with normoactive bowel sounds. No rebound/guarding. Extremities: No clubbing or cyanosis. No edema. Distal pedal pulses are 2+ and equal bilaterally. Neuro: Alert and oriented X 3 but very vague with details otherwise. Moves all extremities spontaneously. Psych: Reports he is feeling irritated  EKG:  The EKG was personally reviewed and demonstrates: listed as NSR but to my eye appears to be atrial fibrillation 88bpm, with borderline RAD and LVH with nonspecific changes, one PVC, and general artifact  Telemetry:  Telemetry was personally reviewed and demonstrates:  Atrial fib occasional PVCs rate controlled  Relevant CV Studies: Outlined above  Laboratory Data:  High Sensitivity Troponin:   Recent Labs  Lab 05/11/20 0738 05/11/20 1057  TROPONINIHS 98* 105*     Chemistry Recent Labs  Lab 05/11/20 0736 05/12/20 0400  NA 138 137  K 3.9 3.3*  CL 103 107  CO2 20* 20*  GLUCOSE 92 101*  BUN 31* 40*  CREATININE 1.20 1.04  CALCIUM 8.7* 8.1*  GFRNONAA >60 >60  GFRAA >60 >60  ANIONGAP 15 10    Recent Labs  Lab 05/11/20 0736 05/12/20 0400  PROT 6.7 5.7*  ALBUMIN 3.3* 2.8*  AST 43* 48*  ALT 20 31  ALKPHOS 94 76  BILITOT 1.8* 1.0   Hematology Recent Labs  Lab 05/11/20 0736 05/12/20 0400  WBC 6.8 5.9  RBC 4.01* 3.64*  HGB 9.5* 8.6*  HCT 31.7* 29.8*  MCV 79.1* 81.9  MCH 23.7* 23.6*  MCHC 30.0 28.9*  RDW 19.6* 19.4*  PLT 102* 99*   BNP Recent Labs  Lab 05/11/20 0736  BNP 2,352.4*    DDimer No results for input(s):  DDIMER in the last 168 hours.   Radiology/Studies:  [facility] Chest Port 1  View  Result Date: 05/11/2020 CLINICAL DATA:  Sepsis workup. EXAM: PORTABLE CHEST 1 VIEW COMPARISON:  04/14/2014 chest and abdominal radiographs. FINDINGS: Bibasilar patchy opacities. A rounded opacity is also seen with lateral right lung base. No pneumothorax. Small right greater than left pleural effusions. Cardiomegaly. Post sternotomy sequela. Telemetry wires overlie the chest. Osteopenia.  Multilevel spondylosis. IMPRESSION: Patchy bibasilar opacities, infection versus atelectasis. Rounded lateral right basilar opacity may reflect a nodule versus rounded atelectasis. Consider CT chest for further evaluation. Cardiomegaly.  Small bilateral pleural effusions. Electronically Signed   By: Chikanele  Emekauwa M.D.   On: 05/11/2020 08:06   { New York Heart Association (NYHA) Functional Class NYHA Class III But unclear how reliable he is with answers  Assessment and Plan:   1. Sepsis felt secondary to community acquired pneumonia, fever present on admission - also with complex PMH including prior MRSA bacteremia and jaw abscess as outlined - abx / blood cultures pending per primary team - CT chest pending to evaluate rounded density  2. SOB, suspected due to acute on chronic combined CHF - patient vague on complaint but BNP certainly elevated - not clear on med compliance - continue oral Lasix for now and monitor I/O's/daily weights - IV mentioned in notes but PO ordered - will review with MD - await echocardiogram  3. Mildly elevated troponin with known hx of CAD s/p CABG - suspect demand ischemia, no reports of chest pain - await echo - do not anticipate aggressive invasive evaluation this admission, will need close f/u in outpatient setting with emphasis on compliance - hold off ASA given concomitant Eliquis use and anemia/thrombocytopenia  4. Anemia/thrombocytopenia - will place order for hemoccult as suggested by  primary team - f/u CBC in AM - further per primary team  5. Suspected chronic atrial fibrillation - rates reasonably controlled on carvedilol - continue Eliquis but trend CBC - f/u echocardiogram  6. H/o pericardial AVR - await f/u echocardiogram  7. Hypokalemia - do not see repletion ordered, will start KCl 40meq daily - BMET order placed for morning  For questions or updates, please contact [facility] Please consult [email or web address] for contact info under    Signed, Dayna N Dunn, PA-C  05/12/2020 11:48 AM  I have seen and examined the patient along with Dayna N Dunn, PA-C .  I have reviewed the chart, notes and new data.  I agree with PA/NP's note.  Key new complaints: seems that dyspnea has improved since admission, still feels achy all over Key examination changes: appears gaunt, malnourished, chronically ill, pale. Reduced breath sounds in bases, but no wet rales. JVP appears normal, no edema. Irregular rhythm. No edema. Key new findings / data: microcytic anemia (chronic), normal WBC (was not elevated during admission for abscess and MRSA bacteremia either), mild thrombocytopenia (platelets similarly low in 2015, but normal on June 2021 labs from [place]); albumin 2.8, improved creatinine and improved lactic acidosis. Nonspecific mild and flat elevation in troponin. INR 2.1.  CT chest IMPRESSION: 1. Signs of pulmonary edema and parenchymal scarring. 2. Loculated bilateral small pleural effusions RIGHT greater than LEFT with ovoid opacity in the RIGHT lung base measuring 2.6 x 1.6 cm, likely rounded atelectasis in the setting of chronic appearing effusions. Consider follow-up or comparison with prior studies to ensure resolution and exclude possibility of pulmonary neoplasm. 3. 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 4. Post LEFT atrial appendage clipping. 5. Aortic atherosclerosis.  PLAN: Biggest concern is possible    systemic illness due to secondary seeding of the aortic valve prosthesis during his episode of Staph aureus bacteremia (received IV vancomycin 6/1-6/27 in [place]). BCx drawn. Transthoracic echo in progress. Images are challenging, but no significant aortic insufficiency or clear vegetations. There is concerning tissue thickening around the aortic annulus, possible periannular abscess. Will wait for completion of study, but I suspect we will need a TEE. Clinically not in overt CHF exacerbation at this time, but BNP 2352, higher than baseline 480 (from 03/27/2020) based on serial labs in Care Everywhere. He appears to be breathing comfortably.   Thurmon Fair, MD, [facility] [phone] 05/12/2020, 1:37 PM

## 2020-05-12 NOTE — Progress Notes (Signed)
TTE shows potential for abscess near prior valvular repair. Dr. Royann Shivers recommends TEE tomorrow and to await blood culture results. We have tentatively scheduled TEE for 12:30pm. Called over to [facility] to discuss with patient but nurse indicates acute issues going on with patient screaming, abdominal pain, low blood pressure - nurse notifying primary team. Tentatively recommend keeping NPO after midnight. Our team will reassess early tomorrow for stability to proceed. Autry Droege PA-C

## 2020-05-12 NOTE — Progress Notes (Signed)
  Echocardiogram 2D Echocardiogram has been performed.  Carlos Welch 05/12/2020, 2:23 PM

## 2020-05-12 NOTE — Progress Notes (Signed)
Pharmacy Antibiotic Note  Carlos Welch is a 74 y.o. male presented to the ED on 05/11/2020 with c/o generalized pain and SOB.  She was started on ceftriaxone and azithromycin on admission for PNA.  Pharmacy is consulted on 7/28 to broaden abx to vancomycin and cefepime.  Today, 05/12/2020: - Tmax 102.6, wbc wnl - scr 0.96 (crcl~72)  Plan: - cefepime 2gm IV q8h - vancomycin 1500 mg IV x1, then 1000 mg IV q12h  ___________________________________  Height: 6\' 2"  (188 cm) Weight: 73.1 kg (161 lb 3.2 oz) IBW/kg (Calculated) : 82.2  Temp (24hrs), Avg:97.9 F (36.6 C), Min:97.7 F (36.5 C), Max:98.5 F (36.9 C)  Recent Labs  Lab 05/11/20 0736 05/11/20 1057 05/12/20 0400 05/12/20 1631 05/12/20 1634  WBC 6.8  --  5.9 6.1  --   CREATININE 1.20  --  1.04 0.96  --   LATICACIDVEN 4.2* 2.4*  --   --  3.4*    Estimated Creatinine Clearance: 71.9 mL/min (by C-G formula based on SCr of 0.96 mg/dL).    No Known Allergies   Thank you for allowing pharmacy to be a part of this patient's care.  05/14/20 05/12/2020 5:42 PM

## 2020-05-12 NOTE — Consult Note (Addendum)
Cardiology Consultation:   Patient ID: Carlos Welch MRN: [id number]; DOB: 10-31-1945  Admit date: 05/11/2020 Date of Consult: 05/12/2020  Primary Care Provider: Patient, No Pcp Per [facility] Cardiologist: Thurmon Fair, MD (new)  [facility] Electrophysiologist:  None    Patient Profile:   Carlos Welch is a 74 y.o. 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 Lucianne Muss.  History of Present Illness:   The patient is a very poor historian with some confabulation noted, very vague answers, states "I already told 10,000 people this, I'm not gonna tell it again." He was originally from [place]. He says he's been here in [place] since 1990 but this does not corroborate with the chart.  He does report a h/o valve surgery at [facility] 9 years ago. Chart reviewed from [facility] which indicates that patient actually has a history of CAD and aortic stenosis s/p CABG and bioprosthetic AVR with LAA clipping in 2015 at [facility]. He reportedly had a TEE in 08/2019 showing EF 35-40%. Chart also outlines h/o afib on Eliquis and subacute bacterial endocarditis. Per Care Everywhere, in May 2021, he was transferred from OSH to [facility] with abscess of the jaw (prior chin implant) and MRSA bacteremia. Per notes "ID was consulted, with recommendations to obtain a TTE (6/1), TEE (6/4) which showed no vegetations on AV and no regurgitation. TEE did show severely enlarged LA." It is not mentioned what his LVEF was in any of the notes and I cannot access those results in CareEverywhere. There is an automated field that pulled in EF 67% on TTE on 03/16/20. There is also commetary  that there was no periprosthetic regurgitation. He was sent home on IV vancomycin to continue through 04/11/20.  It is not known currently if his atrial fib is paroxysmal or chronic. It is mentioned as chronic in one of the medication indications in [facility] records, but exams reporting RRR.  ED note indicates when speaking with the daughter he has a long history of noncompliance with medical treatment and follow-up, frequent admissions for CHF and leaving AMA. His daughter brought him to her home in July 2021. Daughter is not present at bedside today.  He reportedly presented to the hospital with generalized body aches. He denies any CP. He reports some intermittent SOB and nausea but otherwise reports he's irritated and doesn't want to answer questions. He was febrile of 102.6 on arrival with mildly elevated BP, since normotensive. Labs show BNP 2352, hsTroponin 98->105, microcytic anemia down to 8.6 with thrombocytopenia of 99, mildly abnormal AST/bilirubin, BUN 31->40 and Cr 1.20->1.04, initial lactic acid 4.2, UA specific gravity elevated. CXR with patchy bibasilar opacities, infection vs atelectasis, rounded lateral right basilar opacity (CT chest pending), small bilateral effusions and cardiomegaly. 2D echo pending.  Past Medical History:  Diagnosis Date  . A-fib (HCC)   . Aortic stenosis    a. s/p pericardial AVR 2015.  . Bacterial endocarditis   . CAD (coronary artery disease)    a. s/p CABG, AVR, LAA clipping 2015 at [facility].  . Chronic combined systolic and diastolic CHF (congestive heart failure) (HCC)   . History of noncompliance with medical treatment   .  Hypertension   . Malnutrition (HCC)   . MRSA bacteremia   . 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 TIA (transient ischemic attack)   . Unintentional weight loss     Past Surgical History:  Procedure Laterality Date  . AORTIC VALVE REPLACEMENT    . HERNIA REPAIR       Home Medications:  Prior to  Admission medications   Medication Sig Start Date End Date Taking? Authorizing Provider  apixaban (ELIQUIS) 5 MG TABS tablet Take 5 mg by mouth in the morning and at bedtime.   Yes [provider]  atorvastatin (LIPITOR) 80 MG tablet Take 80 mg by mouth daily.   Yes [provider]  carvedilol (COREG) 6.25 MG tablet Take 6.25 mg by mouth 2 (two) times daily with a meal.   Yes [provider]  furosemide (LASIX) 40 MG tablet Take 40 mg by mouth daily.    Yes [provider]  Ginkgo Biloba 40 MG TABS Take 1 tablet by mouth daily.   Yes [provider]  HEMP OIL-VANILLYL BUTYL ETHER EX Apply 1 application topically daily.   Yes [provider]  Multiple Vitamin (MULTIVITAMIN WITH MINERALS) TABS tablet Take 1 tablet by mouth daily.   Yes [provider]  potassium chloride SA (KLOR-CON) 20 MEQ tablet Take 20 mEq by mouth daily.   Yes [provider]  tamsulosin (FLOMAX) 0.4 MG CAPS capsule Take 0.4 mg by mouth daily.   Yes [provider]  TURMERIC PO Take 1 tablet by mouth daily.   Yes [provider]    Inpatient Medications: Scheduled Meds: . apixaban  5 mg Oral BID  . atorvastatin  80 mg Oral Daily  . carvedilol  6.25 mg Oral BID WC  . docusate sodium  100 mg Oral BID  . furosemide  40 mg Oral Daily  . tamsulosin  0.4 mg Oral Daily   Continuous Infusions: . sodium chloride Stopped (05/11/20 1310)  . azithromycin    . cefTRIAXone (ROCEPHIN)  IV 1 g (05/12/20 1115)   PRN Meds: sodium chloride, acetaminophen **OR** acetaminophen, albuterol, hydrALAZINE, ondansetron **OR** ondansetron (ZOFRAN) IV, polyethylene glycol  Allergies:   No Known Allergies  Social History:   Social History   Socioeconomic History  . Marital status: Legally Separated    Spouse name: Not on file  . Number of children: Not on file  . Years of education: Not on file  . Highest education level: Not on file    Occupational History  . Not on file  Tobacco Use  . Smoking status: Former Smoker  Substance and Sexual Activity  . Alcohol use: No  . Drug use: No  . Sexual activity: Not on file  Other Topics Concern  . Not on file  Social History Narrative  . Not on file   Social Determinants of Health   Financial Resource Strain:   . Difficulty of Paying Living Expenses:   Food Insecurity:   . Worried About Programme researcher, broadcasting/film/videounning Out of Food in the Last Year:   . Baristaan Out of Food in the Last Year:   Transportation Needs:   . Freight forwarderLack of Transportation (Medical):   Marland Kitchen. Lack of Transportation (Non-Medical):   Physical Activity:   . Days of Exercise per Week:   . Minutes of Exercise per Session:   Stress:   . Feeling of Stress :   Social Connections:   . Frequency of Communication with Friends and Family:   .  Frequency of Social Gatherings with Friends and Family:   . Attends Religious Services:   . Active Member of Clubs or Organizations:   . Attends Banker Meetings:   Marland Kitchen Marital Status:   Intimate Partner Violence:   . Fear of Current or Ex-Partner:   . Emotionally Abused:   Marland Kitchen Physically Abused:   . Sexually Abused:     Family History:    Family History  Problem Relation Age of Onset  . CAD Neg Hx      ROS:  Patient declines to engage in full ROS  Physical Exam/Data:   Vitals:   05/11/20 1530 05/11/20 1624 05/11/20 2228 05/12/20 0459  BP: (!) 127/92 (!) 114/86 105/73 106/84  Pulse: 76 78 76 74  Resp: 18 (!) 24 (!) 24 20  Temp:  (!) 97.5 F (36.4 C) 97.7 F (36.5 C) 97.7 F (36.5 C)  TempSrc:  Oral Oral Oral  SpO2: 100% 99% 99% 100%  Weight:      Height:        Intake/Output Summary (Last 24 hours) at 05/12/2020 1148 Last data filed at 05/12/2020 0456 Gross per 24 hour  Intake 254.11 ml  Output 425 ml  Net -170.89 ml   Last 3 Weights 05/11/2020 04/14/2014  Weight (lbs) 161 lb 3.2 oz 183 lb  Weight (kg) 73.12 kg 83.008 kg     Body mass index is 20.7 kg/m.   General: Frail dissheveled appearing WM in no acute distress. Head: Normocephalic, atraumatic, sclera non-icteric, no xanthomas, nares are without discharge. Poor dentition Neck: Negative for carotid bruits. JVP not elevated. Lungs: Clear bilaterally to auscultation without wheezes, rales, or rhonchi. Breathing is unlabored. Heart: Irregularly irregular, rate controlled, without murmurs, rubs, or gallops.  Abdomen: Soft, non-tender, non-distended with normoactive bowel sounds. No rebound/guarding. Extremities: No clubbing or cyanosis. No edema. Distal pedal pulses are 2+ and equal bilaterally. Neuro: Alert and oriented X 3 but very vague with details otherwise. Moves all extremities spontaneously. Psych: Reports he is feeling irritated  EKG:  The EKG was personally reviewed and demonstrates: listed as NSR but to my eye appears to be atrial fibrillation 88bpm, with borderline RAD and LVH with nonspecific changes, one PVC, and general artifact  Telemetry:  Telemetry was personally reviewed and demonstrates:  Atrial fib occasional PVCs rate controlled  Relevant CV Studies: Outlined above  Laboratory Data:  High Sensitivity Troponin:   Recent Labs  Lab 05/11/20 0738 05/11/20 1057  TROPONINIHS 98* 105*     Chemistry Recent Labs  Lab 05/11/20 0736 05/12/20 0400  NA 138 137  K 3.9 3.3*  CL 103 107  CO2 20* 20*  GLUCOSE 92 101*  BUN 31* 40*  CREATININE 1.20 1.04  CALCIUM 8.7* 8.1*  GFRNONAA >60 >60  GFRAA >60 >60  ANIONGAP 15 10    Recent Labs  Lab 05/11/20 0736 05/12/20 0400  PROT 6.7 5.7*  ALBUMIN 3.3* 2.8*  AST 43* 48*  ALT 20 31  ALKPHOS 94 76  BILITOT 1.8* 1.0   Hematology Recent Labs  Lab 05/11/20 0736 05/12/20 0400  WBC 6.8 5.9  RBC 4.01* 3.64*  HGB 9.5* 8.6*  HCT 31.7* 29.8*  MCV 79.1* 81.9  MCH 23.7* 23.6*  MCHC 30.0 28.9*  RDW 19.6* 19.4*  PLT 102* 99*   BNP Recent Labs  Lab 05/11/20 0736  BNP 2,352.4*    DDimer No results for input(s):  DDIMER in the last 168 hours.   Radiology/Studies:  [facility] Chest Port 1  View  Result Date: 05/11/2020 CLINICAL DATA:  Sepsis workup. EXAM: PORTABLE CHEST 1 VIEW COMPARISON:  04/14/2014 chest and abdominal radiographs. FINDINGS: Bibasilar patchy opacities. A rounded opacity is also seen with lateral right lung base. No pneumothorax. Small right greater than left pleural effusions. Cardiomegaly. Post sternotomy sequela. Telemetry wires overlie the chest. Osteopenia.  Multilevel spondylosis. IMPRESSION: Patchy bibasilar opacities, infection versus atelectasis. Rounded lateral right basilar opacity may reflect a nodule versus rounded atelectasis. Consider CT chest for further evaluation. Cardiomegaly.  Small bilateral pleural effusions. Electronically Signed   By: Stana Bunting M.D.   On: 05/11/2020 08:06   { New York Heart Association (NYHA) Functional Class NYHA Class III But unclear how reliable he is with answers  Assessment and Plan:   1. Sepsis felt secondary to community acquired pneumonia, fever present on admission - also with complex PMH including prior MRSA bacteremia and jaw abscess as outlined - abx / blood cultures pending per primary team - CT chest pending to evaluate rounded density  2. SOB, suspected due to acute on chronic combined CHF - patient vague on complaint but BNP certainly elevated - not clear on med compliance - continue oral Lasix for now and monitor I/O's/daily weights - IV mentioned in notes but PO ordered - will review with MD - await echocardiogram  3. Mildly elevated troponin with known hx of CAD s/p CABG - suspect demand ischemia, no reports of chest pain - await echo - do not anticipate aggressive invasive evaluation this admission, will need close f/u in outpatient setting with emphasis on compliance - hold off ASA given concomitant Eliquis use and anemia/thrombocytopenia  4. Anemia/thrombocytopenia - will place order for hemoccult as suggested by  primary team - f/u CBC in AM - further per primary team  5. Suspected chronic atrial fibrillation - rates reasonably controlled on carvedilol - continue Eliquis but trend CBC - f/u echocardiogram  6. H/o pericardial AVR - await f/u echocardiogram  7. Hypokalemia - do not see repletion ordered, will start KCl daily - BMET order placed for morning  For questions or updates, please contact [facility] Please consult [email or web address] for contact info under    Signed, Laurann Montana, PA-C  05/12/2020 11:48 AM  I have seen and examined the patient along with Laurann Montana, PA-C .  I have reviewed the chart, notes and new data.  I agree with PA/NP's note.  Key new complaints: seems that dyspnea has improved since admission, still feels achy all over Key examination changes: appears gaunt, malnourished, chronically ill, pale. Reduced breath sounds in bases, but no wet rales. JVP appears normal, no edema. Irregular rhythm. No edema. Key new findings / data: microcytic anemia (chronic), normal WBC (was not elevated during admission for abscess and MRSA bacteremia either), mild thrombocytopenia (platelets similarly low in 2015, but normal on June 2021 labs from [facility]); albumin 2.8, improved creatinine and improved lactic acidosis. Nonspecific mild and flat elevation in troponin. INR 2.1.  CT chest IMPRESSION: 1. Signs of pulmonary edema and parenchymal scarring. 2. Loculated bilateral small pleural effusions RIGHT greater than LEFT with ovoid opacity in the RIGHT lung base measuring 2.6 x 1.6 cm, likely rounded atelectasis in the setting of chronic appearing effusions. Consider follow-up or comparison with prior studies to ensure resolution and exclude possibility of pulmonary neoplasm. 3. 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 4. Post LEFT atrial appendage clipping. 5. Aortic atherosclerosis.  PLAN: Biggest concern is possible  systemic illness due to secondary seeding of the aortic valve prosthesis during his episode of Staph aureus bacteremia (received IV vancomycin 6/1-6/27 in [place]). BCx drawn. Transthoracic echo in progress. Images are challenging, but no significant aortic insufficiency or clear vegetations. There is concerning tissue thickening around the aortic annulus, possible periannular abscess. Will wait for completion of study, but I suspect we will need a TEE. Clinically not in overt CHF exacerbation at this time, but BNP 2352, higher than baseline 480 (from 03/27/2020) based on serial labs in Care Everywhere. He appears to be breathing comfortably.   Thurmon Fair, MD, [facility] [phone] 05/12/2020, 1:37 PM

## 2020-05-12 NOTE — Progress Notes (Signed)
CRITICAL VALUE ALERT  Critical Value:  Lactic Acid 3.4  Date & Time Notied:  05/12/20 1726   Provider Notified: Craige Cotta MD   Orders Received/Actions taken: Awaiting new orders

## 2020-05-12 NOTE — Progress Notes (Signed)
Came to room for rapid response.  Patient presented pale diaphoretic, with severer abdominal pain.  BP 64/50.  No chest pain, ECG afib (baseline).  Started 1L bolus NSL and drew CBC, BMP, trop, PT, PTT, lactic.  MD to room evaluated and order stat CT abdomen.  Bed obtained in ICU.  Transferred to ICU waiting for available space to get CT.

## 2020-05-12 NOTE — Progress Notes (Addendum)
PROGRESS NOTE    Carlos Welch  ZOX:[id number] DOB: Jun 05, 1946 DOA: 05/11/2020 PCP: Patient, No Pcp Per    Brief Narrative: Carlos Welch a 77 Male with PMH of CHF, atrial fibrillation on Eliquis, poor outpatient follow-ups, and left against medical advice multiple times with unknown remaining history,  no records on file who presents with several day history of shortness of breath, malaise, fever. The patient reports he originally lives in [place] where he receives his medical care, but has recently moved to [place] to live with his daughter.   He reports prolonged hospitalization in [facility] and then was transferred to [facility].  His daughter reports to the ED physician that patient had prosthetic valve replacement 6 years ago and then after that he never followed up with his doctors.  He has recurrent hospitalization for CHF exacerbation but then he ended up being signing against medical advice, patient reports orthopnea, exertional dyspnea with bilateral leg swelling.  He self titrates his Lasix medication.  He reports tmax of 102.F at home earlier today. He was admitted from med [facility] for sepsis secondary to community-acquired pneumonia and CHF exacerbation.  Cardiology consulted recommended continue antibiotics, patient might need TEE.  CT chest shows bilateral loculated effusion with opacity concerning for malignancy, pulmonology consulted.   Assessment & Plan:   Principal Problem:   Sepsis (HCC) Active Problems:   Community acquired pneumonia   Acute CHF (congestive heart failure) (HCC)   Atrial fibrillation, chronic (HCC)   Sepsis secondary to community-acquired pneumonia / UTI. Sepsis present on admission, temp 102.6, respiratory rate 24, lactic acid 4.2,.   Chest x-ray rounded lateral basilar opacity may reflect nodule or rounded atelectasis. Continue ceftriaxone and Zithromax for community-acquired  pneumonia. Follow blood cultures,  urine culture grew Klebsiella pneumonia CT chest shows bilateral loculated effusion with opacity concerning for malignancy. Pulmonology consulted.  Acute shortness of breath could be secondary to CHF exacerbation BNP 2354, chest x-ray shows cardiomegaly, does not appear in exacerbation. 2D echocardiogram completed results pending Continue Lasix 40 mg IV daily. Cardiology consulted recommended possibility of TEE   Elevated troponin could be secondary to CHF exacerbation/demand ischemia. Continue to trend troponin, consider cardiology consult Obtain 2D echo-we will pending  Thrombocytopenia No previous labs to compare baseline platelet count. 101 - 99. Recheck labs tomorrow.  Chronic anemia : He denies any GI bleed, hemoptysis.  Will obtain a stool for occult blood.  A. fib with normal ventricular rate heart rate controlled, continue Coreg. Continue Eliquis for anticoagulation,    DVT prophylaxis: Apixaban Code Status: Full code Family Communication: No one at bedside, discussed with patient in detail Disposition Plan:   Disposition: Patient is from home DC: Home with home PT versus SNF Patient is not medically clear, pending work-up  Consultants:   Cardiology.  Procedures:   Echo , CT chest.  Antimicrobials:  Anti-infectives (From admission, onward)   Start     Dose/Rate Route Frequency Ordered Stop   05/12/20 1200  azithromycin (ZITHROMAX) 500 mg in sodium chloride 0.9 % 250 mL IVPB  Status:  Discontinued        500 mg 250 mL/hr over 60 Minutes Intravenous  Once 05/11/20 1707 05/12/20 0755   05/12/20 1200  azithromycin (ZITHROMAX) 500 mg in sodium chloride 0.9 % 250 mL IVPB     Discontinue     500 mg 250 mL/hr over 60 Minutes Intravenous Every 24 hours 05/12/20 0749     05/12/20  1100  cefTRIAXone (ROCEPHIN) 1 g in sodium chloride 0.9 % 100 mL IVPB  Status:  Discontinued        1 g 200 mL/hr over 30 Minutes Intravenous   Once 05/11/20 1707 05/12/20 0755   05/12/20 1000  cefTRIAXone (ROCEPHIN) 1 g in sodium chloride 0.9 % 100 mL IVPB     Discontinue     1 g 200 mL/hr over 30 Minutes Intravenous Every 24 hours 05/12/20 0749     05/11/20 1800  azithromycin (ZITHROMAX) 500 mg in sodium chloride 0.9 % 250 mL IVPB  Status:  Discontinued        500 mg 250 mL/hr over 60 Minutes Intravenous Every 24 hours 05/11/20 1713 05/11/20 1725   05/11/20 1800  cefTRIAXone (ROCEPHIN) 1 g in sodium chloride 0.9 % 100 mL IVPB  Status:  Discontinued        1 g 200 mL/hr over 30 Minutes Intravenous Every 24 hours 05/11/20 1706 05/11/20 1709   05/11/20 0930  cefTRIAXone (ROCEPHIN) 1 g in sodium chloride 0.9 % 100 mL IVPB        1 g 200 mL/hr over 30 Minutes Intravenous  Once 05/11/20 0923 05/11/20 1143   05/11/20 0930  azithromycin (ZITHROMAX) 500 mg in sodium chloride 0.9 % 250 mL IVPB        500 mg 250 mL/hr over 60 Minutes Intravenous  Once 05/11/20 0258 05/11/20 1310     Subjective: Patient was seen and examined at bedside.  No overnight events.  Patient seems like not cooperative.  Does not give detailed answers.  Denies any chest pain shortness of breath dizziness palpitation.  Objective: Vitals:   05/11/20 1530 05/11/20 1624 05/11/20 2228 05/12/20 0459  BP: (!) 127/92 (!) 114/86 105/73 106/84  Pulse: 76 78 76 74  Resp: 18 (!) 24 (!) 24 20  Temp:  (!) 97.5 F (36.4 C) 97.7 F (36.5 C) 97.7 F (36.5 C)  TempSrc:  Oral Oral Oral  SpO2: 100% 99% 99% 100%  Weight:      Height:        Intake/Output Summary (Last 24 hours) at 05/12/2020 1412 Last data filed at 05/12/2020 0456 Gross per 24 hour  Intake --  Output 425 ml  Net -425 ml   Filed Weights   05/11/20 0745  Weight: 73.1 kg    Examination:  General exam: Appears calm and comfortable  Respiratory system: Clear to auscultation. Respiratory effort normal. Cardiovascular system: S1 & S2 heard, RRR. No JVD, murmurs, rubs, gallops or clicks. No pedal  edema. Gastrointestinal system: Abdomen is nondistended, soft and nontender. No organomegaly or masses felt. Normal bowel sounds heard. Central nervous system: Alert and oriented. No focal neurological deficits. Extremities: Symmetric 5 x 5 power. Skin: No rashes, lesions or ulcers Psychiatry: Judgement and insight appear normal. Mood & affect appropriate.     Data Reviewed: I have personally reviewed following labs and imaging studies  CBC: Recent Labs  Lab 05/11/20 0736 05/12/20 0400  WBC 6.8 5.9  NEUTROABS 6.4  --   HGB 9.5* 8.6*  HCT 31.7* 29.8*  MCV 79.1* 81.9  PLT 102* 99*   Basic Metabolic Panel: Recent Labs  Lab 05/11/20 0736 05/12/20 0400  NA 138 137  K 3.9 3.3*  CL 103 107  CO2 20* 20*  GLUCOSE 92 101*  BUN 31* 40*  CREATININE 1.20 1.04  CALCIUM 8.7* 8.1*   GFR: Estimated Creatinine Clearance: 66.4 mL/min (by C-G formula based on SCr of 1.04 mg/dL).  Liver Function Tests: Recent Labs  Lab 05/11/20 0736 05/12/20 0400  AST 43* 48*  ALT 20 31  ALKPHOS 94 76  BILITOT 1.8* 1.0  PROT 6.7 5.7*  ALBUMIN 3.3* 2.8*   No results for input(s): LIPASE, AMYLASE in the last 168 hours. No results for input(s): AMMONIA in the last 168 hours. Coagulation Profile: Recent Labs  Lab 05/11/20 0736  INR 2.1*   Cardiac Enzymes: No results for input(s): CKTOTAL, CKMB, CKMBINDEX, TROPONINI in the last 168 hours. BNP (last 3 results) No results for input(s): PROBNP in the last 8760 hours. HbA1C: No results for input(s): HGBA1C in the last 72 hours. CBG: No results for input(s): GLUCAP in the last 168 hours. Lipid Profile: No results for input(s): CHOL, HDL, LDLCALC, TRIG, CHOLHDL, LDLDIRECT in the last 72 hours. Thyroid Function Tests: No results for input(s): TSH, T4TOTAL, FREET4, T3FREE, THYROIDAB in the last 72 hours. Anemia Panel: No results for input(s): VITAMINB12, FOLATE, FERRITIN, TIBC, IRON, RETICCTPCT in the last 72 hours. Sepsis Labs: Recent Labs   Lab 05/11/20 0736 05/11/20 1057  LATICACIDVEN 4.2* 2.4*    Recent Results (from the past 240 hour(s))  Urine culture     Status: Abnormal (Preliminary result)   Collection Time: 05/11/20  7:36 AM   Specimen: In/Out Cath Urine  Result Value Ref Range Status   Specimen Description   Final    IN/OUT CATH URINE Performed at [facility] [place], [address] [place], [place] [address]    Special Requests   Final    NONE Performed at [facility] [place], [address] [place], [place] [address]    Culture (A)  Final    3,000 COLONIES/mL KLEBSIELLA PNEUMONIAE SUSCEPTIBILITIES TO FOLLOW Performed at [facility], [address] [place], [place] [address]    Report Status PENDING  Incomplete  SARS Coronavirus 2 by RT PCR (hospital order, performed in [facility] hospital lab) Nasopharyngeal Nasopharyngeal Swab     Status: None   Collection Time: 05/11/20  8:00 AM   Specimen: Nasopharyngeal Swab  Result Value Ref Range Status   SARS Coronavirus 2 NEGATIVE NEGATIVE Final    Comment: (NOTE) SARS-CoV-2 target nucleic acids are NOT DETECTED.  The SARS-CoV-2 RNA is generally detectable in upper and lower respiratory specimens during the acute phase of infection. The lowest concentration of SARS-CoV-2 viral copies this assay can detect is 250 copies / mL. A negative result does not preclude SARS-CoV-2 infection and should not be used as the sole basis for treatment or other patient management decisions.  A negative result may occur with improper specimen collection / handling, submission of specimen other than nasopharyngeal swab, presence of viral mutation(s) within the areas targeted by this assay, and inadequate number of viral copies (<250 copies / mL). A negative result must be combined with clinical observations, patient history, and epidemiological information.  Fact Sheet for Patients:   BoilerBrush.com.cyhttps://www.fda.gov/media/136312/download  Fact Sheet for Healthcare  Providers: https://pope.com/https://www.fda.gov/media/136313/download  This test is not yet approved or  cleared by the [place] FDA and has been authorized for detection and/or diagnosis of SARS-CoV-2 by FDA under an Emergency Use Authorization (EUA).  This EUA will remain in effect (meaning this test can be used) for the duration of the COVID-19 declaration under Section 564(b)(1) of the Act, 21 U.S.C. section 360bbb-3(b)(1), unless the authorization is terminated or revoked sooner.  Performed at [facility] [place], [address] [place], [place] [address]  Radiology Studies: CT CHEST WO CONTRAST  Result Date: 05/12/2020 CLINICAL DATA:  Abnormal x-ray, history of pulmonary abnormalities on chest x-ray EXAM: CT CHEST WITHOUT CONTRAST TECHNIQUE: Multidetector CT imaging of the chest was performed following the standard protocol without IV contrast. COMPARISON:  Chest x-ray May 11, 2020 FINDINGS: Cardiovascular: Post CABG with extensive native coronary artery calcification and aortic valve replacement. Generalized atheromatous plaque in the thoracic aorta without aneurysmal dilation. Heart size is enlarged without pericardial effusion. Limited assessment of heart and great vessels due to lack of intravenous contrast. Mediastinum/Nodes: Scattered mediastinal lymph nodes upper limits of normal, likely related to reactive changes in longstanding cardiac dysfunction. No frank adenopathy. There is however some enlargement of venous confluence on the LEFT of the pulmonary veins entering the LEFT atrium. This may represent a common pulmonary venous trunk on the LEFT. Post LEFT atrial appendage clipping Lungs/Pleura: Signs of septal thickening. Small RIGHT effusion showing loculation. Also with small loculated LEFT pleural effusion. Scattered areas of volume loss and linear opacity at the lung bases predominantly. Upper Abdomen: Low-density hepatic lesions measuring water density compatible with small  cysts but incompletely imaged with respect to the largest in the central LEFT hemi liver. Extensive vascular calcifications track into the abdominal aorta. Adreniform thickening of bilateral adrenal glands. No acute upper abdominal process. Musculoskeletal: Osteopenia.  Spinal degenerative changes. IMPRESSION: 1. Signs of pulmonary edema and parenchymal scarring. 2. Loculated bilateral small pleural effusions RIGHT greater than LEFT with ovoid opacity in the RIGHT lung base measuring 2.6 x 1.6 cm, likely rounded atelectasis in the setting of chronic appearing effusions. Consider follow-up or comparison with prior studies to ensure resolution and exclude possibility of pulmonary neoplasm. 3. 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 4. Post LEFT atrial appendage clipping. 5. Aortic atherosclerosis. Aortic Atherosclerosis (ICD10-I70.0). Electronically Signed   By: Donzetta Kohut M.D.   On: 05/12/2020 09:29   DG Chest Port 1 View  Result Date: 05/11/2020 CLINICAL DATA:  Sepsis workup. EXAM: PORTABLE CHEST 1 VIEW COMPARISON:  04/14/2014 chest and abdominal radiographs. FINDINGS: Bibasilar patchy opacities. A rounded opacity is also seen with lateral right lung base. No pneumothorax. Small right greater than left pleural effusions. Cardiomegaly. Post sternotomy sequela. Telemetry wires overlie the chest. Osteopenia.  Multilevel spondylosis. IMPRESSION: Patchy bibasilar opacities, infection versus atelectasis. Rounded lateral right basilar opacity may reflect a nodule versus rounded atelectasis. Consider CT chest for further evaluation. Cardiomegaly.  Small bilateral pleural effusions. Electronically Signed   By: Stana Bunting M.D.   On: 05/11/2020 08:06    Scheduled Meds: . apixaban  5 mg Oral BID  . atorvastatin  80 mg Oral Daily  . carvedilol  6.25 mg Oral BID WC  . docusate sodium  100 mg Oral BID  . furosemide  40 mg Oral Daily  . potassium chloride   40 mEq Oral Daily  . tamsulosin  0.4 mg Oral Daily   Continuous Infusions: . sodium chloride Stopped (05/11/20 1310)  . azithromycin    . cefTRIAXone (ROCEPHIN)  IV 1 g (05/12/20 1115)     LOS: 1 day    Time spent: 25 mins.    Cipriano Bunker, MD [facility]   If 7PM-7AM, please contact night-coverage

## 2020-05-12 NOTE — Consult Note (Addendum)
NAMECaden Welch, MRN:  [id number], DOB:  1945-12-13, LOS: 1 ADMISSION DATE:  05/11/2020, CONSULTATION DATE: 7/28 REFERRING MD: Dr. Lucianne Muss, CHIEF COMPLAINT: Abdominal pain, hypotension  Brief History   74 year old male admitted with sepsis secondary to pneumonia, CHF exacerbation, and pleural effusions.  Then developed hypotension and abdominal pain prompting PCCM consultation and ICU transfer.  History of present illness    74 year old male with past medical history as below, which is significant for bioprosthetic aortic valve, atrial fibrillation on Eliquis, coronary artery disease status post CABG, bacterial endocarditis, jaw abscess, and combined congestive heart failure.  He has received most of his medical care today in [place] and has recently moved to [place] to live with his daughter.  He was recently admitted to [facility] for MRSA bacteremia, at which a TEE was done with no evidence of vegetation or regurgitation.  He was sent home on IV vancomycin was continued through 04/11/20.  He apparently has a long history of medical noncompliance and frequently discharges from the hospital AGAINST MEDICAL ADVICE.   He presented with similar emergency department in the morning hours of 7/27 with complaints of body aches accompanied by occasional shortness of breath and nausea.  Upon arrival to the emergency department he was febrile to 102 and was dyspneic.  Chest x-ray was concerning for pneumonia and he was started on ceftriaxone and azithromycin.  He was admitted to the hospital service for ongoing treatment.  CT of the chest was concerning for bilateral pleural effusions felt to be loculated and an area of opacification that was concerning for mass.  He was evaluated by cardiology.  This evaluation included an echocardiogram, which was concerning for abscess of the aortic valve.  Shortly after eating lunch, the patient began to complain of excruciating nominal pain.  Subsequently he began  to drop his blood pressure with systolic in the 60s.  His care team noted he appears more pale at this time as well.  PCCM was consulted for developing shock.  Past Medical History   has a past medical history of A-fib [facility]), Aortic stenosis, Bacterial endocarditis, CAD (coronary artery disease), Chronic combined systolic and diastolic CHF (congestive heart failure) (HCC), History of noncompliance with medical treatment, Hypertension, Malnutrition (HCC), MRSA bacteremia, S/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 (2015), TIA (transient ischemic attack), and Unintentional weight loss.   Significant Hospital Events     Consults:  Cardiology PCCM  Procedures:    Significant Diagnostic Tests:  Echo 7/28 > CT chest 7/28 > Signs of pulmonary edema and parenchymal scarring. Loculated bilateral small pleural effusions RIGHT greater than LEFT with ovoid opacity in the RIGHT lung base measuring 2.6 x 1.6 cm, likely rounded atelectasis in the setting of chronic appearing effusions.  CT abdomen 7/28 >  Micro Data:  Blood 7/28 > Urine 7/28 >  3K colonies klebsiella = negative.   Antimicrobials:  Ceftriaxone 7/27 >7/28 Azithromycin 7/27 > 7/28 Cefepime 7/28 >>> Vancomycin 7/28 >>>  Interim history/subjective:  As above  Objective   Blood pressure (!) 80/68, pulse 70, temperature 97.7 F (36.5 C), temperature source Oral, resp. rate (!) 27, height 6\' 2"  (1.88 m), weight 73.1 kg, SpO2 100 %.        Intake/Output Summary (Last 24 hours) at 05/12/2020 1658 Last data filed at 05/12/2020 1430 Gross per 24 hour  Intake 120 ml  Output 625 ml  Net -505 ml   Filed Weights   05/11/20 0745  Weight: 73.1 kg  Examination: General: Thin elderly male in moderate distress related to abdominal pain HENT: [place]/AT, PERRL, no JVD Lungs: Clear bilateral breath sounds Cardiovascular: RRR, no MRG Abdomen: Soft, diffusely tender, non-distended Extremities: No acute deformity or ROM  limitation Neuro: Alert, oriented, non-focal.   Resolved Hospital Problem list     Assessment & Plan:   Shock: presumably septic shock, but cannot rule out hemorrhagic shock. Patient is pale and hgb was borderline low and had been trending down. POssible GIB? Infectious source of pneumonia vs bacteremia vs possible abscess of aortic valve. Recent admission for MRSA bacteremia and history of bacterial endocarditis. Also considering intra-abdominal source with new abdominal pain.  - Transfer to ICU for vasopressor support - CT abdomen - TEE tomorrow - Follow cultures - Broaden antibiotics to cefepime vanco considering history of MRSA bacteremia and possible abscess.  - STAT labs pending - BID PPI  Acute on chronic HFrEF Atrial fibrillation CAD s/p CABG Aortic valve replacement: bioprosthetic  - cardiology following - Holding lasix in the setting of shock - formal echo read pending.  - Holding Eliquis pending labs.  - hold carvediolol  Elevated troponin: likely demand ischemia.  - no obvious ischemia on EKG - Cardiology following  Hypokalemia - Replace K  Hyperglycemia without history of DM - Follow chemistry   Best practice:  Diet: NPO Pain/Anxiety/Delirium protocol (if indicated): morphine PRN VAP protocol (if indicated): NA DVT prophylaxis: Eliquis on hold GI prophylaxis: PPI BID Glucose control:  Mobility: BR Code Status: FULL Family Communication: patient updated Disposition: ICU  Labs   CBC: Recent Labs  Lab 05/11/20 0736 05/12/20 0400  WBC 6.8 5.9  NEUTROABS 6.4  --   HGB 9.5* 8.6*  HCT 31.7* 29.8*  MCV 79.1* 81.9  PLT 102* 99*    Basic Metabolic Panel: Recent Labs  Lab 05/11/20 0736 05/12/20 0400  NA 138 137  K 3.9 3.3*  CL 103 107  CO2 20* 20*  GLUCOSE 92 101*  BUN 31* 40*  CREATININE 1.20 1.04  CALCIUM 8.7* 8.1*   GFR: Estimated Creatinine Clearance: 66.4 mL/min (by C-G formula based on SCr of 1.04 mg/dL). Recent Labs  Lab  05/11/20 0736 05/11/20 1057 05/12/20 0400  WBC 6.8  --  5.9  LATICACIDVEN 4.2* 2.4*  --     Liver Function Tests: Recent Labs  Lab 05/11/20 0736 05/12/20 0400  AST 43* 48*  ALT 20 31  ALKPHOS 94 76  BILITOT 1.8* 1.0  PROT 6.7 5.7*  ALBUMIN 3.3* 2.8*   No results for input(s): LIPASE, AMYLASE in the last 168 hours. No results for input(s): AMMONIA in the last 168 hours.  ABG No results found for: PHART, PCO2ART, PO2ART, HCO3, TCO2, ACIDBASEDEF, O2SAT   Coagulation Profile: Recent Labs  Lab 05/11/20 0736  INR 2.1*    Cardiac Enzymes: No results for input(s): CKTOTAL, CKMB, CKMBINDEX, TROPONINI in the last 168 hours.  HbA1C: No results found for: HGBA1C  CBG: Recent Labs  Lab 05/12/20 1622  GLUCAP 148*    Review of Systems:    Bolds are positive  Constitutional: weight loss, gain, night sweats, Fevers, chills, fatigue .  HEENT: headaches, Sore throat, sneezing, nasal congestion, post nasal drip, Difficulty swallowing, Tooth/dental problems, visual complaints visual changes, ear ache CV:  chest pain, radiates:,Orthopnea, PND, swelling in lower extremities, dizziness, palpitations, syncope.  GI  heartburn, indigestion, abdominal pain, nausea, vomiting, diarrhea, change in bowel habits, loss of appetite, bloody stools.  Resp: cough, productive: , hemoptysis, dyspnea, chest pain, pleuritic.  Skin: rash or itching or icterus GU: dysuria, change in color of urine, urgency or frequency. flank pain, hematuria  MS: joint pain or swelling. decreased range of motion  Psych: change in mood or affect. depression or anxiety.  Neuro: difficulty with speech, weakness, numbness, ataxia    Past Medical History  He,  has a past medical history of A-fib [facility]), Aortic stenosis, Bacterial endocarditis, CAD (coronary artery disease), Chronic combined systolic and diastolic CHF (congestive heart failure) (HCC), History of noncompliance with medical treatment, Hypertension,  Malnutrition (HCC), MRSA bacteremia, S/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 (2015), TIA (transient ischemic attack), and Unintentional weight loss.   Surgical History    Past Surgical History:  Procedure Laterality Date  . AORTIC VALVE REPLACEMENT    . HERNIA REPAIR       Social History   reports that he has quit smoking. He does not have any smokeless tobacco history on file. He reports that he does not drink alcohol and does not use drugs.   Family History   His family history is negative for CAD.   Allergies No Known Allergies   Home Medications  Prior to Admission medications   Medication Sig Start Date End Date Taking? Authorizing Provider  apixaban (ELIQUIS) 5 MG TABS tablet Take 5 mg by mouth in the morning and at bedtime.   Yes [provider]  atorvastatin (LIPITOR) 80 MG tablet Take 80 mg by mouth daily.   Yes [provider]  carvedilol (COREG) 6.25 MG tablet Take 6.25 mg by mouth 2 (two) times daily with a meal.   Yes [provider]  furosemide (LASIX) 40 MG tablet Take 40 mg by mouth daily.    Yes [provider]  Ginkgo Biloba 40 MG TABS Take 1 tablet by mouth daily.   Yes [provider]  HEMP OIL-VANILLYL BUTYL ETHER EX Apply 1 application topically daily.   Yes [provider]  Multiple Vitamin (MULTIVITAMIN WITH MINERALS) TABS tablet Take 1 tablet by mouth daily.   Yes [provider]  potassium chloride SA (KLOR-CON) 20 MEQ tablet Take 20 mEq by mouth daily.   Yes [provider]  tamsulosin (FLOMAX) 0.4 MG CAPS capsule Take 0.4 mg by mouth daily.   Yes [provider]  TURMERIC PO Take 1 tablet by mouth daily.   Yes [provider]     Critical care time: 45 mins     Joneen Roach, AGACNP-BC  Pulmonary/Critical Care  See Amion for personal pager PCCM on call pager [phone]  05/12/2020 5:32 PM

## 2020-05-12 NOTE — Evaluation (Signed)
Occupational Therapy Evaluation Patient Details Name: Carlos Welch MRN: [id number] DOB: 1945-11-21 Today's Date: 05/12/2020    History of Present Illness Carlos Welch is a 74 y.o. 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 is a 74 year old man who presents with generalized weakness and decreased activity tolerance resulting in decreased ability to independently ambulate and perform self care tasks. Patient required min assist to transfer into sitting, min guard for standing and ambulating and set up for ADLs due to decreased activity tolerance. Patient's o2 sats maintained between 99-100% on room air.  Patient will benefit from skilled OT services to improve deficits while in hospital in order to return home at discharge. Unlikely that patient will need OT at discharge.    Follow Up Recommendations  No OT follow up    Equipment Recommendations  None recommended by OT    Recommendations for Other Services       Precautions / Restrictions Precautions Precautions: Fall Restrictions Weight Bearing Restrictions: No      Mobility Bed Mobility Overal bed mobility: Needs Assistance Bed Mobility: Supine to Sit     Supine to sit: Min assist     General bed mobility comments: required hand hold to transfer to side of bed.  Transfers Overall transfer level: Needs assistance Equipment used: Rolling walker (2 wheeled) Transfers: Sit to/from [employer] Sit to Stand: Min guard Stand pivot transfers: Min guard            Balance Overall balance assessment: No apparent balance deficits (not formally assessed)                                          ADL either performed or assessed with clinical judgement   ADL Overall ADL's : Needs assistance/impaired Eating/Feeding: Independent   Grooming: Set up   Upper Body Bathing: Set up   Lower Body Bathing: Minimal assistance;Sit to/from stand;Min guard   Upper Body Dressing : Set up;Sitting   Lower Body Dressing: Set up;Min guard Lower Body Dressing Details (indicate cue type and reason): required foot stool to donn socks Toilet Transfer: Min guard;RW;Ambulation   Toileting- Architect and Hygiene: Min guard;Sit to/from stand       Functional mobility during ADLs: Hydrographic surveyor     Praxis      Pertinent Vitals/Pain Pain Assessment: No/denies pain     Hand Dominance     Extremity/Trunk Assessment Upper Extremity Assessment Upper Extremity Assessment: RUE deficits/detail;LUE deficits/detail RUE Deficits / Details: R shoulder less than functional ROM, otherwise functional ROM and strength LUE Deficits / Details: Functional ROM and strength   Lower Extremity Assessment Lower Extremity Assessment: Defer to PT evaluation   Cervical / Trunk Assessment Cervical / Trunk Assessment: Normal   Communication     Cognition Arousal/Alertness: Awake/alert Behavior During Therapy: WFL for tasks assessed/performed Overall Cognitive Status: Within Functional Limits for tasks assessed  General Comments: Alert and oriented x 4.   General Comments       Exercises     Shoulder Instructions      Home Living Family/patient expects to be discharged to:: Private residence Living Arrangements: Children Available Help at Discharge: Family Type of Home: House Home Access: Stairs to enter Secretary/administrator of Steps: 1   Home Layout: One level     Bathroom Shower/Tub: Chief Strategy Officer: Standard     Home Equipment: Gilmer Mor - single  point   Additional Comments: Patient reports living with his daughter who currently works from home but will be returning to work eventually. Reports independence with ADLs and ambulating with a cane. Reports recently being in rehab in [place].      Prior Functioning/Environment Level of Independence: Independent with assistive device(s)        Comments: Independent with use of cane.        OT Problem List: Decreased strength;Decreased activity tolerance;Decreased knowledge of use of DME or AE      OT Treatment/Interventions: Self-care/ADL training;Therapeutic exercise;Therapeutic activities;Patient/family education    OT Goals(Current goals can be found in the care plan section) Acute Rehab OT Goals Patient Stated Goal: to return home at discharge OT Goal Formulation: With patient Time For Goal Achievement: 05/26/20 Potential to Achieve Goals: Good  OT Frequency: Min 2X/week   Barriers to D/C:            Co-evaluation              AM-PAC OT "6 Clicks" Daily Activity     Outcome Measure Help from another person eating meals?: None Help from another person taking care of personal grooming?: A Little Help from another person toileting, which includes using toliet, bedpan, or urinal?: A Little Help from another person bathing (including washing, rinsing, drying)?: A Little Help from another person to put on and taking off regular upper body clothing?: A Little Help from another person to put on and taking off regular lower body clothing?: A Little 6 Click Score: 19   End of Session Equipment Utilized During Treatment: Gait belt;Rolling walker Nurse Communication: Mobility status  Activity Tolerance: Patient tolerated treatment well Patient left: in bed;with call bell/phone within reach;with bed alarm set  OT Visit Diagnosis: Muscle weakness (generalized) (M62.81)                Time: 0076-2263 OT Time Calculation (min): 22 min Charges:  OT General  Charges $OT Visit: 1 Visit OT Evaluation $OT Eval Low Complexity: 1 Low  Maxim Bedel, OTR/L Acute Care Rehab Services  Office [phone] Pager: [phone]   Kelli Churn 05/12/2020, 12:51 PM

## 2020-05-12 NOTE — Progress Notes (Addendum)
Pt diaphoretic, cool, clammy, pale. Screaming out in pain. Complains of upper abdominal pain. Nausea. BP 68/55, HR 61. Charge nurse and A/C arrived to room. Rapid Response called. Dr Lucianne Muss notified and present in room. Patient transferred to ICU.

## 2020-05-13 ENCOUNTER — Encounter (HOSPITAL_COMMUNITY)
Admission: EM | Disposition: A | Discharge status: Home / Self Care | Payer: Self-pay | Source: Home / Self Care | Attending: Family Medicine

## 2020-05-13 ENCOUNTER — Inpatient Hospital Stay (HOSPITAL_COMMUNITY): Payer: Medicare Other

## 2020-05-13 ENCOUNTER — Inpatient Hospital Stay (HOSPITAL_COMMUNITY): Payer: Medicare Other | Admitting: Certified Registered Nurse Anesthetist

## 2020-05-13 ENCOUNTER — Encounter (HOSPITAL_COMMUNITY): Payer: Self-pay | Admitting: Family Medicine

## 2020-05-13 DIAGNOSIS — I361 Nonrheumatic tricuspid (valve) insufficiency: Secondary | ICD-10-CM

## 2020-05-13 DIAGNOSIS — I482 Chronic atrial fibrillation, unspecified: Secondary | ICD-10-CM

## 2020-05-13 DIAGNOSIS — I5041 Acute combined systolic (congestive) and diastolic (congestive) heart failure: Secondary | ICD-10-CM

## 2020-05-13 DIAGNOSIS — R6521 Severe sepsis with septic shock: Secondary | ICD-10-CM | POA: Diagnosis not present

## 2020-05-13 DIAGNOSIS — I340 Nonrheumatic mitral (valve) insufficiency: Secondary | ICD-10-CM

## 2020-05-13 DIAGNOSIS — J189 Pneumonia, unspecified organism: Secondary | ICD-10-CM

## 2020-05-13 DIAGNOSIS — A419 Sepsis, unspecified organism: Secondary | ICD-10-CM | POA: Diagnosis not present

## 2020-05-13 HISTORY — PX: TEE WITHOUT CARDIOVERSION: SHX5443

## 2020-05-13 LAB — CBC
HCT: 29.5 % — ABNORMAL LOW (ref 39.0–52.0)
Hemoglobin: 8.4 g/dL — ABNORMAL LOW (ref 13.0–17.0)
MCH: 24.1 pg — ABNORMAL LOW (ref 26.0–34.0)
MCHC: 28.5 g/dL — ABNORMAL LOW (ref 30.0–36.0)
MCV: 84.5 fL (ref 80.0–100.0)
Platelets: 108 10*3/uL — ABNORMAL LOW (ref 150–400)
RBC: 3.49 MIL/uL — ABNORMAL LOW (ref 4.22–5.81)
RDW: 19.3 % — ABNORMAL HIGH (ref 11.5–15.5)
WBC: 4.4 10*3/uL (ref 4.0–10.5)
nRBC: 0 % (ref 0.0–0.2)

## 2020-05-13 LAB — COMPREHENSIVE METABOLIC PANEL
ALT: 27 U/L (ref 0–44)
AST: 36 U/L (ref 15–41)
Albumin: 2.7 g/dL — ABNORMAL LOW (ref 3.5–5.0)
Alkaline Phosphatase: 72 U/L (ref 38–126)
Anion gap: 10 (ref 5–15)
BUN: 34 mg/dL — ABNORMAL HIGH (ref 8–23)
CO2: 18 mmol/L — ABNORMAL LOW (ref 22–32)
Calcium: 8 mg/dL — ABNORMAL LOW (ref 8.9–10.3)
Chloride: 106 mmol/L (ref 98–111)
Creatinine, Ser: 0.89 mg/dL (ref 0.61–1.24)
GFR calc Af Amer: >60 mL/min (ref >60)
GFR calc non Af Amer: >60 mL/min (ref >60)
Glucose, Bld: 106 mg/dL — ABNORMAL HIGH (ref 70–99)
Potassium: 3.7 mmol/L (ref 3.5–5.1)
Sodium: 134 mmol/L — ABNORMAL LOW (ref 135–145)
Total Bilirubin: 0.7 mg/dL (ref 0.3–1.2)
Total Protein: 5.6 g/dL — ABNORMAL LOW (ref 6.5–8.1)

## 2020-05-13 LAB — PHOSPHORUS: Phosphorus: 2.9 mg/dL (ref 2.5–4.6)

## 2020-05-13 LAB — MAGNESIUM: Magnesium: 1.9 mg/dL (ref 1.7–2.4)

## 2020-05-13 LAB — URINE CULTURE: Culture: 3000 — AB

## 2020-05-13 LAB — MRSA PCR SCREENING: MRSA by PCR: NEGATIVE

## 2020-05-13 LAB — ECHO TEE
AV Mean grad: 8 mmHg
AV Peak grad: 13.4 mmHg
Ao pk vel: 1.83 m/s
Radius: 0.6 cm

## 2020-05-13 LAB — TSH: TSH: 1.178 u[IU]/mL (ref 0.350–4.500)

## 2020-05-13 SURGERY — ECHOCARDIOGRAM, TRANSESOPHAGEAL
Anesthesia: Monitor Anesthesia Care

## 2020-05-13 MED ORDER — PROPOFOL 500 MG/50ML IV EMUL
INTRAVENOUS | Status: DC | PRN
  Administered 2020-05-13: 120 ug/kg/min via INTRAVENOUS

## 2020-05-13 MED ORDER — LOSARTAN POTASSIUM 25 MG PO TABS
12.5000 mg | ORAL_TABLET | Freq: Every day | ORAL | Status: DC
  Administered 2020-05-13 – 2020-05-17 (×5): 12.5 mg via ORAL
  Filled 2020-05-13 (×5): qty 0.5

## 2020-05-13 MED ORDER — PROPOFOL 10 MG/ML IV BOLUS
INTRAVENOUS | Status: DC | PRN
  Administered 2020-05-13: 10 mg via INTRAVENOUS
  Administered 2020-05-13: 40 mg via INTRAVENOUS
  Administered 2020-05-13: 30 mg via INTRAVENOUS

## 2020-05-13 MED ORDER — ORAL CARE MOUTH RINSE
15.0000 mL | Freq: Two times a day (BID) | OROMUCOSAL | Status: DC
  Administered 2020-05-13 – 2020-05-19 (×11): 15 mL via OROMUCOSAL

## 2020-05-13 MED ORDER — LIDOCAINE HCL (CARDIAC) PF 100 MG/5ML IV SOSY
PREFILLED_SYRINGE | INTRAVENOUS | Status: DC | PRN
  Administered 2020-05-13: 60 mg via INTRAVENOUS

## 2020-05-13 MED ORDER — HYDROMORPHONE HCL 1 MG/ML IJ SOLN
0.5000 mg | Freq: Once | INTRAMUSCULAR | Status: AC
  Administered 2020-05-13: 0.5 mg via INTRAVENOUS
  Filled 2020-05-13: qty 1

## 2020-05-13 MED ORDER — TRAZODONE HCL 50 MG PO TABS
25.0000 mg | ORAL_TABLET | Freq: Every evening | ORAL | Status: DC | PRN
  Administered 2020-05-13 – 2020-05-18 (×4): 25 mg via ORAL
  Filled 2020-05-13 (×4): qty 1

## 2020-05-13 MED ORDER — DIPHENHYDRAMINE HCL 25 MG PO CAPS
25.0000 mg | ORAL_CAPSULE | Freq: Four times a day (QID) | ORAL | Status: DC | PRN
  Administered 2020-05-13: 25 mg via ORAL
  Filled 2020-05-13: qty 1

## 2020-05-13 MED ORDER — PHENYLEPHRINE 40 MCG/ML (10ML) SYRINGE FOR IV PUSH (FOR BLOOD PRESSURE SUPPORT)
PREFILLED_SYRINGE | INTRAVENOUS | Status: DC | PRN
  Administered 2020-05-13: 200 ug via INTRAVENOUS
  Administered 2020-05-13: 160 ug via INTRAVENOUS
  Administered 2020-05-13: 40 ug via INTRAVENOUS
  Administered 2020-05-13: 160 ug via INTRAVENOUS

## 2020-05-13 MED ORDER — EPHEDRINE SULFATE-NACL 50-0.9 MG/10ML-% IV SOSY
PREFILLED_SYRINGE | INTRAVENOUS | Status: DC | PRN
  Administered 2020-05-13: 5 mg via INTRAVENOUS
  Administered 2020-05-13: 15 mg via INTRAVENOUS
  Administered 2020-05-13: 20 mg via INTRAVENOUS
  Administered 2020-05-13: 10 mg via INTRAVENOUS

## 2020-05-13 NOTE — Transfer of Care (Signed)
Immediate Anesthesia Transfer of Care Note  Patient: Mackson Hoback  Procedure(s) Performed: TRANSESOPHAGEAL ECHOCARDIOGRAM (TEE) (N/A )  Patient Location: Endoscopy Unit  Anesthesia Type:MAC  Level of Consciousness: drowsy  Airway & Oxygen Therapy: Patient Spontanous Breathing and Patient connected to nasal cannula oxygen  Post-op Assessment: Report given to RN and Post -op Vital signs reviewed and stable  Post vital signs: Reviewed and stable  Last Vitals:  Vitals Value Taken Time  BP 106/76 1010  Temp    Pulse 72   Resp    SpO2 100     Last Pain:  Vitals:   05/13/20 0925  TempSrc: Oral  PainSc: 0-No pain      Patients Stated Pain Goal: 0 (83/81/84 0375)  Complications: No complications documented.

## 2020-05-13 NOTE — Anesthesia Preprocedure Evaluation (Signed)
Anesthesia Evaluation  Patient identified by MRN, date of birth, ID band Patient awake    Reviewed: Allergy & Precautions, NPO status , Patient's Chart, lab work & pertinent test results  History of Anesthesia Complications Negative for: history of anesthetic complications  Airway Mallampati: II  TM Distance: >3 FB Neck ROM: Full    Dental  (+) Edentulous Upper, Edentulous Lower, Upper Dentures, Lower Dentures   Pulmonary neg pulmonary ROS, former smoker,    Pulmonary exam normal        Cardiovascular hypertension, + CAD, + CABG and +CHF  Normal cardiovascular exam+ dysrhythmias Atrial Fibrillation + Valvular Problems/Murmurs (s/p AVR 2015) AS  Rhythm:Regular Rate:Normal     Neuro/Psych TIAnegative psych ROS   GI/Hepatic negative GI ROS, Neg liver ROS,   Endo/Other  negative endocrine ROS  Renal/GU negative Renal ROS  negative genitourinary   Musculoskeletal negative musculoskeletal ROS (+)   Abdominal   Peds  Hematology  (+) anemia ,   Anesthesia Other Findings   Reproductive/Obstetrics                             Anesthesia Physical Anesthesia Plan  ASA: III  Anesthesia Plan: MAC   Post-op Pain Management:    Induction: Intravenous  PONV Risk Score and Plan: 1 and Propofol infusion, TIVA and Treatment may vary due to age or medical condition  Airway Management Planned: Natural Airway and Nasal Cannula  Additional Equipment: None  Intra-op Plan:   Post-operative Plan:   Informed Consent: I have reviewed the patients History and Physical, chart, labs and discussed the procedure including the risks, benefits and alternatives for the proposed anesthesia with the patient or authorized representative who has indicated his/her understanding and acceptance.       Plan Discussed with:   Anesthesia Plan Comments:         Anesthesia Quick Evaluation

## 2020-05-13 NOTE — Progress Notes (Signed)
  Echocardiogram Echocardiogram Transesophageal has been performed.  Janalyn Harder 05/13/2020, 10:35 AM

## 2020-05-13 NOTE — Progress Notes (Signed)
NAMEMarcelino Welch, MRN:  [id number], DOB:  09/21/1946, LOS: 2 ADMISSION DATE:  05/11/2020, CONSULTATION DATE: 7/28 REFERRING MD: Dr. Lucianne Muss, CHIEF COMPLAINT: Abdominal pain, hypotension  Brief History   74 year old male admitted with sepsis secondary to pneumonia, CHF exacerbation, and pleural effusions.  Then developed hypotension and abdominal pain prompting PCCM consultation and ICU transfer.  History of present illness    74 year old male with past medical history as below, which is significant for bioprosthetic aortic valve, atrial fibrillation on Eliquis, coronary artery disease status post CABG, bacterial endocarditis, jaw abscess, and combined congestive heart failure.  He has received most of his medical care today in [place] and has recently moved to [place] to live with his daughter.  He was recently admitted to [facility] for MRSA bacteremia, at which a TEE was done with no evidence of vegetation or regurgitation.  He was sent home on IV vancomycin was continued through 04/11/20.  He apparently has a long history of medical noncompliance and frequently discharges from the hospital AGAINST MEDICAL ADVICE.   He presented with similar emergency department in the morning hours of 7/27 with complaints of body aches accompanied by occasional shortness of breath and nausea.  Upon arrival to the emergency department he was febrile to 102 and was dyspneic. Chest x-ray was concerning for pneumonia and he was started on ceftriaxone and azithromycin.  He was admitted to the hospital service for ongoing treatment.  CT of the chest was concerning for bilateral pleural effusions felt to be loculated and an area of opacification that was concerning for mass.  He was evaluated by cardiology.  This evaluation included an echocardiogram, which was concerning for possible abscess of the aortic valve.  Shortly after eating lunch, the patient began to complain of excruciating abdominal pain.  Subsequently  he began to drop his blood pressure with systolic in the 60s.  His care team noted he appears more pale at this time as well.  PCCM was consulted for developing shock.  Past Medical History   has a past medical history of A-fib [facility]), Aortic stenosis, Bacterial endocarditis, CAD (coronary artery disease), Chronic combined systolic and diastolic CHF (congestive heart failure) (HCC), History of noncompliance with medical treatment, Hypertension, Malnutrition (HCC), MRSA bacteremia, S/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 (2015), TIA (transient ischemic attack), and Unintentional weight loss.   Significant Hospital Events   7/27 Admit  7/28 PCCM consulted for shock   Consults:  Cardiology PCCM  Procedures:    Significant Diagnostic Tests:  Echo 7/28 > LVEF 55-60%, no RWMA, indeterminate LV diastolic parameters, RV systolic function normal, moderately elevated pulmonary artery systolic pressure, LA severely dilated, RA severely dilated, MV normal in structure, mild MR, perivalvular thickening around the bioprosthetic aortic valve, no rocking motion CT chest 7/28 > Signs of pulmonary edema and parenchymal scarring. Loculated bilateral small pleural effusions RIGHT greater than LEFT with ovoid opacity in the RIGHT lung base measuring 2.6 x 1.6 cm, likely rounded atelectasis in the setting of chronic appearing effusions.  CT abdomen 7/28 > bilateral pleural effusions and septal thickening, fatty liver, partial distention of the gallbladder with pericholecystic fluid.  TEE 7/29 7/29 >  RUQ [place] 7/29 >>  Micro Data:  Blood 7/28 > Urine 7/28 >  3K colonies klebsiella    Antimicrobials:  Ceftriaxone 7/27 >7/28 Azithromycin 7/27 > 7/28 Cefepime 7/28 >> Vancomycin 7/28 >>  Interim history/subjective:  Afebrile  On 2L [place] I/O 650 ml UOP, -319 ml in last  24 hours Pt reports feeling much better.  States he thinks the food he was eating made him sick because it was so hard.  Denies  pain / shortness of breath currently.  Not on pressors / BP stable   Objective   Blood pressure 118/79, pulse 45, temperature 98.1 F (36.7 C), temperature source Oral, resp. rate 17, height 6\' 2"  (1.88 m), weight 73.1 kg, SpO2 98 %.        Intake/Output Summary (Last 24 hours) at 05/13/2020 0754 Last data filed at 05/13/2020 0700 Gross per 24 hour  Intake 330.83 ml  Output 650 ml  Net -319.17 ml   Filed Weights   05/11/20 0745  Weight: 73.1 kg    Examination: General: elderly gentleman lying in bed in NAD  HEENT: MM pink/moist, anicteric, wearing glasses, pale  Neuro: AAOx4, speech clear, MAE  CV: s1s2 irr irr, AF in 70's on monitor with frequent PVC's, no m/r/g PULM: non-labored on RA (Cale out of nose), lungs bilaterally clear anterior, bibasilar crackles GI: soft, bsx4 active  Extremities: warm/dry, trace BLE edema, lower extremity changes consistent with chronic venous stasis   Skin: no rashes or lesions  Resolved Hospital Problem list     Assessment & Plan:   Transient Shock: suspect septic shock, hgb stable. Infectious source of pneumonia vs bacteremia vs possible abscess of aortic valve. Recent admission for MRSA bacteremia and history of bacterial endocarditis. Also considering intra-abdominal source with new abdominal pain.  CT abd with questionable distention of gallbladder.  3K klebsiella in urine culture.  -change to SDU status  -assess RUQ 05/13/20  -follow cultures  -continue broad spectrum abx for now given hx of MRSA and possible valvular abscess  -BID PPI, assess FOBT to rule out GIB (less likely given hgb stable at this point, no evidence of bleeding)  Acute on chronic HFrEF Atrial fibrillation CAD s/p CABG Aortic valve replacement: bioprosthetic  -appreciate Cardiology evaluation  -await TEE  -hold diuresis in setting of shock in last 24 hours  -hold home eliquis 7/29 -hold home coreg   Elevated troponin: likely demand ischemia.  -tele monitoring    -appreciate Cardiology input   Hypokalemia -monitor, replace as indicated   Hyperglycemia without history of DM -follow BMP    Best practice:  Diet: NPO Pain/Anxiety/Delirium protocol (if indicated): morphine PRN VAP protocol (if indicated): NA DVT prophylaxis: Eliquis on hold GI prophylaxis: PPI BID Glucose control:  Mobility: BR Code Status: FULL Family Communication: Patient updated on plan of care 7/29 Disposition: ICU  Labs   CBC: Recent Labs  Lab 05/11/20 0736 05/12/20 0400 05/12/20 1631 05/13/20 0205  WBC 6.8 5.9 6.1 4.4  NEUTROABS 6.4  --   --   --   HGB 9.5* 8.6* 8.9* 8.4*  HCT 31.7* 29.8* 30.6* 29.5*  MCV 79.1* 81.9 82.0 84.5  PLT 102* 99* 109* 108*    Basic Metabolic Panel: Recent Labs  Lab 05/11/20 0736 05/12/20 0400 05/12/20 1631 05/13/20 0205  NA 138 137 137 134*  K 3.9 3.3* 3.4* 3.7  CL 103 107 107 106  CO2 20* 20* 22 18*  GLUCOSE 92 101* 157* 106*  BUN 31* 40* 40* 34*  CREATININE 1.20 1.04 0.96 0.89  CALCIUM 8.7* 8.1* 7.9* 8.0*  MG  --   --   --  1.9  PHOS  --   --   --  2.9   GFR: Estimated Creatinine Clearance: 77.6 mL/min (by C-G formula based on SCr of 0.89 mg/dL). Recent  Labs  Lab 05/11/20 0736 05/11/20 1057 05/12/20 0400 05/12/20 1631 05/12/20 1634 05/12/20 1904 05/13/20 0205  WBC 6.8  --  5.9 6.1  --   --  4.4  LATICACIDVEN 4.2* 2.4*  --   --  3.4* 2.8*  --     Liver Function Tests: Recent Labs  Lab 05/11/20 0736 05/12/20 0400 05/12/20 1631 05/13/20 0205  AST 43* 48* 46* 36  ALT 20 31 32 27  ALKPHOS 94 76 86 72  BILITOT 1.8* 1.0 1.0 0.7  PROT 6.7 5.7* 6.2* 5.6*  ALBUMIN 3.3* 2.8* 3.0* 2.7*   No results for input(s): LIPASE, AMYLASE in the last 168 hours. No results for input(s): AMMONIA in the last 168 hours.  ABG No results found for: PHART, PCO2ART, PO2ART, HCO3, TCO2, ACIDBASEDEF, O2SAT   Coagulation Profile: Recent Labs  Lab 05/11/20 0736 05/12/20 1634  INR 2.1* 2.1*    Cardiac Enzymes: No  results for input(s): CKTOTAL, CKMB, CKMBINDEX, TROPONINI in the last 168 hours.  HbA1C: No results found for: HGBA1C  CBG: Recent Labs  Lab 05/12/20 1622  GLUCAP 148*     Critical care time: n/a     Canary Brim, MSN, NP-C [facility] 05/13/2020, 7:54 AM   Please see [email or web address] for pager details.

## 2020-05-13 NOTE — Progress Notes (Signed)
    Transesophageal Echocardiogram Note  Dawud Mays [id number] 1945/11/16  Procedure: Transesophageal Echocardiogram Indications: bacteremia  Procedure Details Consent: Obtained Time Out: Verified patient identification, verified procedure, site/side was marked, verified correct patient position, special equipment/implants available, Radiology Safety Procedures followed,  medications/allergies/relevent history reviewed, required imaging and test results available.  Performed  Medications:  Pt sedated by anesthesia with diprovan 220 mg IV.  Normal LV function; severe biatrial enlargement; LAA clipped; mild RVE; s/p AVR with no AS or AI; thickened MV with moderate to severe MR; moderate to severe TR; no valvular vegetations or abscess noted; small oscillating densities in LV of uncertain etiology; ? Ruptured chordae; would be unusual location for vegetations.    Complications: No apparent complications Patient did tolerate procedure well.  Olga Millers, MD

## 2020-05-13 NOTE — Interval H&P Note (Signed)
History and Physical Interval Note:  05/13/2020 9:43 AM  Carlos Welch  has presented today for surgery, with the diagnosis of TO ACCESS A VALVE.  The various methods of treatment have been discussed with the patient and family. After consideration of risks, benefits and other options for treatment, the patient has consented to  Procedure(s): TRANSESOPHAGEAL ECHOCARDIOGRAM (TEE) (N/A) as a surgical intervention.  The patient's history has been reviewed, patient examined, no change in status, stable for surgery.  I have reviewed the patient's chart and labs.  Questions were answered to the patient's satisfaction.     Olga Millers

## 2020-05-13 NOTE — Progress Notes (Signed)
PROGRESS NOTE    Carlos Welch  ZOX:[id number] DOB: 1946/10/01 DOA: 05/11/2020 PCP: Patient, No Pcp Per    Brief Narrative: Delvonte Berenson a 74 Male with PMH of CHF, atrial fibrillation on Eliquis, poor outpatient follow-ups, and left against medical advice multiple times with unknown remaining history,  no records on file who presents with several day history of shortness of breath, malaise, fever. The patient reports he originally lives in [place] where he receives his medical care, but has recently moved to [place] to live with his daughter.   He reports prolonged hospitalization in [facility] and then was transferred to [place] [facility].  His daughter reports to the ED physician that patient had prosthetic valve replacement 6 years ago and then after that he never followed up with his doctors.  He has recurrent hospitalization for CHF exacerbation but then he ended up being signing against medical advice, patient reports orthopnea, exertional dyspnea with bilateral leg swelling.  He self titrates his Lasix medication.  He reports tmax of 102.F at home earlier today. He was admitted from med [facility] for sepsis secondary to community-acquired pneumonia and CHF exacerbation.  Cardiology consulted recommended continue antibiotics, patient might need TEE.  CT chest shows bilateral loculated effusion with opacity concerning for malignancy, pulmonology consulted.  He had severe abdominal pain followed by transient shock requiring pressors, he was accepted in the ICU,  managed with Levophed.  Blood pressure has improved, transferred back to SDU.  Patient is scheduled to have a TEE at [facility] today.   Assessment & Plan:   Principal Problem:   Septic shock (HCC) Active Problems:   Community acquired pneumonia   Acute CHF (congestive heart failure) (HCC)   Atrial fibrillation, chronic (HCC)   Generalized abdominal pain   Pressure injury of  skin   Sepsis secondary to community-acquired pneumonia / UTI. Sepsis present on admission, temp 102.6, respiratory rate 24, lactic acid 4.2,.   Chest x-ray rounded lateral basilar opacity may reflect nodule or rounded atelectasis. Continue ceftriaxone and Zithromax for community-acquired pneumonia. Follow blood cultures,  urine culture grew 3K Klebsiella pneumonia. Vancomycin and cefepime added for given history of MRSA and possible valvular abscess. CT chest shows bilateral loculated effusion with opacity concerning for malignancy. Pulmonology consulted, helps with critical care management.   Acute shortness of breath could be secondary to CHF exacerbation BNP 2354, chest x-ray shows cardiomegaly, Appears euvolumic 2D echocardiogram completed results LVEF 55-60% Continue Lasix 40 mg IV daily. Cardiology consulted recommended TEE to r/o vegetation, valvular abscess.   Elevated troponin could be secondary to CHF exacerbation/demand ischemia. Continue to trend troponin, consider cardiology consult 2D echo- LVEF 55-60%  Thrombocytopenia No previous labs to compare baseline platelet count. 101 - 99. Recheck labs tomorrow.  Chronic anemia : He denies any GI bleed, hemoptysis.  Will obtain a stool for occult blood. GI bleed would be less likely since hemoglobin remained stable.  A. fib with normal ventricular rate heart rate controlled, continue Coreg. Continue Eliquis for anticoagulation,    DVT prophylaxis: Apixaban Code Status: Full code Family Communication: No one at bedside, discussed with patient in detail Disposition Plan:   Disposition: Patient is from home. DC: Home with home PT versus SNF Patient is not medically clear, pending work-up  Consultants:   Cardiology.  Procedures:   Echo , CT chest.  Antimicrobials:  Anti-infectives (From admission, onward)   Start     Dose/Rate Route Frequency Ordered Stop  05/13/20 0600  [MAR Hold]  vancomycin  (VANCOCIN) IVPB 1000 mg/200 mL premix     Discontinue     (MAR Hold since Thu 05/13/2020 at 0923.Hold Reason: Transfer to a Procedural area.)   1,000 mg 200 mL/hr over 60 Minutes Intravenous Every 12 hours 05/12/20 1749     05/12/20 1830  vancomycin (VANCOREADY) IVPB 1500 mg/300 mL        1,500 mg 150 mL/hr over 120 Minutes Intravenous  Once 05/12/20 1749 05/12/20 2055   05/12/20 1800  [MAR Hold]  ceFEPIme (MAXIPIME) 2 g in sodium chloride 0.9 % 100 mL IVPB     Discontinue     (MAR Hold since Thu 05/13/2020 at 0923.Hold Reason: Transfer to a Procedural area.)   2 g 200 mL/hr over 30 Minutes Intravenous Every 8 hours 05/12/20 1749     05/12/20 1200  azithromycin (ZITHROMAX) 500 mg in sodium chloride 0.9 % 250 mL IVPB  Status:  Discontinued        500 mg 250 mL/hr over 60 Minutes Intravenous  Once 05/11/20 1707 05/12/20 0755   05/12/20 1200  azithromycin (ZITHROMAX) 500 mg in sodium chloride 0.9 % 250 mL IVPB  Status:  Discontinued        500 mg 250 mL/hr over 60 Minutes Intravenous Every 24 hours 05/12/20 0749 05/12/20 1723   05/12/20 1100  cefTRIAXone (ROCEPHIN) 1 g in sodium chloride 0.9 % 100 mL IVPB  Status:  Discontinued        1 g 200 mL/hr over 30 Minutes Intravenous  Once 05/11/20 1707 05/12/20 0755   05/12/20 1000  cefTRIAXone (ROCEPHIN) 1 g in sodium chloride 0.9 % 100 mL IVPB  Status:  Discontinued        1 g 200 mL/hr over 30 Minutes Intravenous Every 24 hours 05/12/20 0749 05/12/20 1723   05/11/20 1800  azithromycin (ZITHROMAX) 500 mg in sodium chloride 0.9 % 250 mL IVPB  Status:  Discontinued        500 mg 250 mL/hr over 60 Minutes Intravenous Every 24 hours 05/11/20 1713 05/11/20 1725   05/11/20 1800  cefTRIAXone (ROCEPHIN) 1 g in sodium chloride 0.9 % 100 mL IVPB  Status:  Discontinued        1 g 200 mL/hr over 30 Minutes Intravenous Every 24 hours 05/11/20 1706 05/11/20 1709   05/11/20 0930  cefTRIAXone (ROCEPHIN) 1 g in sodium chloride 0.9 % 100 mL IVPB        1 g 200  mL/hr over 30 Minutes Intravenous  Once 05/11/20 0923 05/11/20 1143   05/11/20 0930  azithromycin (ZITHROMAX) 500 mg in sodium chloride 0.9 % 250 mL IVPB        500 mg 250 mL/hr over 60 Minutes Intravenous  Once 05/11/20 1027 05/11/20 1310     Subjective: Patient was seen and examined at bedside.  He developed severe abdominal pain following lunch yesterday.  He had a brief hypotensive shock requiring pressors, transferred to ICU and placed on Levophed, blood pressure has improved.  Patient reports feeling much better,  scheduled to have a TEE to rule out vegetation or valvular abscess today. Objective: Vitals:   05/13/20 1019 05/13/20 1020 05/13/20 1030 05/13/20 1040  BP: 104/76 94/79 97/73  100/71  Pulse: 75 73 76 71  Resp: (!) 9 (!) 10 (!) 10 (!) 9  Temp:      TempSrc:      SpO2: 100% 100% 96% 94%  Weight:      Height:  Intake/Output Summary (Last 24 hours) at 05/13/2020 1122 Last data filed at 05/13/2020 1003 Gross per 24 hour  Intake 630.83 ml  Output 650 ml  Net -19.17 ml   Filed Weights   05/11/20 0745 05/13/20 0925  Weight: 73.1 kg 77.1 kg    Examination:  General exam: Appears calm and comfortable  Respiratory system: Clear to auscultation. Respiratory effort normal. Cardiovascular system: S1 & S2 heard, RRR. No JVD, murmurs, rubs, gallops or clicks. No pedal edema. Gastrointestinal system: Abdomen is nondistended, soft and nontender. No organomegaly or masses felt. Normal bowel sounds heard. Central nervous system: Alert and oriented. No focal neurological deficits. Extremities: Symmetric 5 x 5 power. Skin: No rashes, lesions or ulcers Psychiatry: Judgement and insight appear normal. Mood & affect appropriate.     Data Reviewed: I have personally reviewed following labs and imaging studies  CBC: Recent Labs  Lab 05/11/20 0736 05/12/20 0400 05/12/20 1631 05/13/20 0205  WBC 6.8 5.9 6.1 4.4  NEUTROABS 6.4  --   --   --   HGB 9.5* 8.6* 8.9* 8.4*  HCT  31.7* 29.8* 30.6* 29.5*  MCV 79.1* 81.9 82.0 84.5  PLT 102* 99* 109* 108*   Basic Metabolic Panel: Recent Labs  Lab 05/11/20 0736 05/12/20 0400 05/12/20 1631 05/13/20 0205  NA 138 137 137 134*  K 3.9 3.3* 3.4* 3.7  CL 103 107 107 106  CO2 20* 20* 22 18*  GLUCOSE 92 101* 157* 106*  BUN 31* 40* 40* 34*  CREATININE 1.20 1.04 0.96 0.89  CALCIUM 8.7* 8.1* 7.9* 8.0*  MG  --   --   --  1.9  PHOS  --   --   --  2.9   GFR: Estimated Creatinine Clearance: 81.8 mL/min (by C-G formula based on SCr of 0.89 mg/dL). Liver Function Tests: Recent Labs  Lab 05/11/20 0736 05/12/20 0400 05/12/20 1631 05/13/20 0205  AST 43* 48* 46* 36  ALT 20 31 32 27  ALKPHOS 94 76 86 72  BILITOT 1.8* 1.0 1.0 0.7  PROT 6.7 5.7* 6.2* 5.6*  ALBUMIN 3.3* 2.8* 3.0* 2.7*   No results for input(s): LIPASE, AMYLASE in the last 168 hours. No results for input(s): AMMONIA in the last 168 hours. Coagulation Profile: Recent Labs  Lab 05/11/20 0736 05/12/20 1634  INR 2.1* 2.1*   Cardiac Enzymes: No results for input(s): CKTOTAL, CKMB, CKMBINDEX, TROPONINI in the last 168 hours. BNP (last 3 results) No results for input(s): PROBNP in the last 8760 hours. HbA1C: No results for input(s): HGBA1C in the last 72 hours. CBG: Recent Labs  Lab 05/12/20 1622  GLUCAP 148*   Lipid Profile: No results for input(s): CHOL, HDL, LDLCALC, TRIG, CHOLHDL, LDLDIRECT in the last 72 hours. Thyroid Function Tests: Recent Labs    05/13/20 0205  TSH 1.178   Anemia Panel: No results for input(s): VITAMINB12, FOLATE, FERRITIN, TIBC, IRON, RETICCTPCT in the last 72 hours. Sepsis Labs: Recent Labs  Lab 05/11/20 0736 05/11/20 1057 05/12/20 1634 05/12/20 1904  LATICACIDVEN 4.2* 2.4* 3.4* 2.8*    Recent Results (from the past 240 hour(s))  Urine culture     Status: Abnormal   Collection Time: 05/11/20  7:36 AM   Specimen: In/Out Cath Urine  Result Value Ref Range Status   Specimen Description   Final    IN/OUT  CATH URINE Performed at [facility], [address] [place], [place] [address]    Special Requests   Final    NONE  Performed at [facility] [place], [address] [place], [place] [address]    Culture (A)  Final    3,000 COLONIES/mL KLEBSIELLA PNEUMONIAE Confirmed Extended Spectrum Beta-Lactamase Producer (ESBL).  In bloodstream infections from ESBL organisms, carbapenems are preferred over piperacillin/tazobactam. They are shown to have a lower risk of mortality.    Report Status 05/13/2020 FINAL  Final   Organism ID, Bacteria KLEBSIELLA PNEUMONIAE (A)  Final      Susceptibility   Klebsiella pneumoniae - MIC*    AMPICILLIN >=32 RESISTANT Resistant     CEFAZOLIN >=64 RESISTANT Resistant     CEFTRIAXONE >=64 RESISTANT Resistant     CIPROFLOXACIN 2 INTERMEDIATE Intermediate     GENTAMICIN >=16 RESISTANT Resistant     IMIPENEM <=0.25 SENSITIVE Sensitive     NITROFURANTOIN 64 INTERMEDIATE Intermediate     TRIMETH/SULFA >=320 RESISTANT Resistant     AMPICILLIN/SULBACTAM >=32 RESISTANT Resistant     PIP/TAZO 16 SENSITIVE Sensitive     * 3,000 COLONIES/mL KLEBSIELLA PNEUMONIAE  SARS Coronavirus 2 by RT PCR (hospital order, performed in [facility] hospital lab) Nasopharyngeal Nasopharyngeal Swab     Status: None   Collection Time: 05/11/20  8:00 AM   Specimen: Nasopharyngeal Swab  Result Value Ref Range Status   SARS Coronavirus 2 NEGATIVE NEGATIVE Final    Comment: (NOTE) SARS-CoV-2 target nucleic acids are NOT DETECTED.  The SARS-CoV-2 RNA is generally detectable in upper and lower respiratory specimens during the acute phase of infection. The lowest concentration of SARS-CoV-2 viral copies this assay can detect is 250 copies / mL. A negative result does not preclude SARS-CoV-2 infection and should not be used as the sole basis for treatment or other patient management decisions.  A negative result may occur with improper specimen collection / handling,  submission of specimen other than nasopharyngeal swab, presence of viral mutation(s) within the areas targeted by this assay, and inadequate number of viral copies (<250 copies / mL). A negative result must be combined with clinical observations, patient history, and epidemiological information.  Fact Sheet for Patients:   BoilerBrush.com.cyhttps://www.fda.gov/media/136312/download  Fact Sheet for Healthcare Providers: https://pope.com/https://www.fda.gov/media/136313/download  This test is not yet approved or  cleared by the [place] FDA and has been authorized for detection and/or diagnosis of SARS-CoV-2 by FDA under an Emergency Use Authorization (EUA).  This EUA will remain in effect (meaning this test can be used) for the duration of the COVID-19 declaration under Section 564(b)(1) of the Act, 21 U.S.C. section 360bbb-3(b)(1), unless the authorization is terminated or revoked sooner.  Performed at [facility] [place], [address] [place], [place] [address]   MRSA PCR Screening     Status: None   Collection Time: 05/13/20  7:50 AM   Specimen: Nasal Mucosa; Nasopharyngeal  Result Value Ref Range Status   MRSA by PCR NEGATIVE NEGATIVE Final    Comment:        The GeneXpert MRSA Assay (FDA approved for NASAL specimens only), is one component of a comprehensive MRSA colonization surveillance program. It is not intended to diagnose MRSA infection nor to guide or monitor treatment for MRSA infections. Performed at [facility], [address] [place], [place] [address]      Radiology Studies: CT CHEST WO CONTRAST  Result Date: 05/12/2020 CLINICAL DATA:  Abnormal x-ray, history of pulmonary abnormalities on chest x-ray EXAM: CT CHEST WITHOUT CONTRAST TECHNIQUE: Multidetector CT imaging of the chest was performed following the standard protocol without IV  contrast. COMPARISON:  Chest x-ray May 11, 2020 FINDINGS: Cardiovascular: Post CABG with extensive native coronary artery  calcification and aortic valve replacement. Generalized atheromatous plaque in the thoracic aorta without aneurysmal dilation. Heart size is enlarged without pericardial effusion. Limited assessment of heart and great vessels due to lack of intravenous contrast. Mediastinum/Nodes: Scattered mediastinal lymph nodes upper limits of normal, likely related to reactive changes in longstanding cardiac dysfunction. No frank adenopathy. There is however some enlargement of venous confluence on the LEFT of the pulmonary veins entering the LEFT atrium. This may represent a common pulmonary venous trunk on the LEFT. Post LEFT atrial appendage clipping Lungs/Pleura: Signs of septal thickening. Small RIGHT effusion showing loculation. Also with small loculated LEFT pleural effusion. Scattered areas of volume loss and linear opacity at the lung bases predominantly. Upper Abdomen: Low-density hepatic lesions measuring water density compatible with small cysts but incompletely imaged with respect to the largest in the central LEFT hemi liver. Extensive vascular calcifications track into the abdominal aorta. Adreniform thickening of bilateral adrenal glands. No acute upper abdominal process. Musculoskeletal: Osteopenia.  Spinal degenerative changes. IMPRESSION: 1. Signs of pulmonary edema and parenchymal scarring. 2. Loculated bilateral small pleural effusions RIGHT greater than LEFT with ovoid opacity in the RIGHT lung base measuring 2.6 x 1.6 cm, likely rounded atelectasis in the setting of chronic appearing effusions. Consider follow-up or comparison with prior studies to ensure resolution and exclude possibility of pulmonary neoplasm. 3. 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 4. Post LEFT atrial appendage clipping. 5. Aortic atherosclerosis. Aortic Atherosclerosis (ICD10-I70.0). Electronically Signed   By: Donzetta Kohut M.D.   On: 05/12/2020 09:29   CT ABDOMEN PELVIS W  CONTRAST  Result Date: 05/13/2020 CLINICAL DATA:  Abdominal pain EXAM: CT ABDOMEN AND PELVIS WITH CONTRAST TECHNIQUE: Multidetector CT imaging of the abdomen and pelvis was performed using the standard protocol following bolus administration of intravenous contrast. CONTRAST:  OMNIPAQUE IOHEXOL 300 MG/ML  SOLN COMPARISON:  None. FINDINGS: Lower chest: Lung bases demonstrate bilateral pleural effusions and septal thickening similar to that seen on recent CT of the chest. Hepatobiliary: Fatty infiltration of the liver is noted. Scattered hypodensities are noted throughout the liver consistent with small cysts. The gallbladder is partially distended. Mild Peri cholecystic fluid is noted. No definitive gallstones are seen. Pancreas: Unremarkable. No pancreatic ductal dilatation or surrounding inflammatory changes. Spleen: Normal in size without focal abnormality. Adrenals/Urinary Tract: Adrenal glands show some hypertrophy in the left adrenal. The kidneys demonstrate a normal enhancement pattern. Mild perinephric stranding is noted worse on the left than the right. Delayed images show normal excretion of contrast material. The bladder is well distended. Stomach/Bowel: Mild fecal material is noted within the colon. No obstructive or inflammatory changes are seen. The appendix is not well visualized. No inflammatory changes to suggest appendicitis are seen. Small bowel and stomach appear unremarkable. Vascular/Lymphatic: Aortic atherosclerosis. No enlarged abdominal or pelvic lymph nodes. Retroaortic left renal vein is noted. Reproductive: Prostate is unremarkable. Other: No abdominal wall hernia or abnormality. No abdominopelvic ascites. Musculoskeletal: Degenerative changes of lumbar spine are noted. IMPRESSION: Bilateral pleural effusions and septal thickening similar to that seen on recent chest CT Fatty liver. Partial distension of the gallbladder with pericholecystic fluid. If patient has right upper  quadrant localized pain ultrasound may be helpful for further evaluation. No other definitive acute abnormality is seen. Electronically Signed   By: Alcide Clever M.D.   On: 05/13/2020 00:05   DG  Chest Port 1 View  Result Date: 05/13/2020 CLINICAL DATA:  Hypoxia EXAM: PORTABLE CHEST 1 VIEW COMPARISON:  Chest radiograph May 11, 2020 and chest CT May 12, 2020 FINDINGS: There is persistent pleural effusion on the right. Smaller pleural effusion present 1 day prior on the left not appreciable on current radiographic examination. There is atelectatic change in the right base with questionable superimposed pneumonia in the right base. Lungs elsewhere appear clear. There is cardiomegaly with pulmonary vascularity normal. Patient is status post coronary artery bypass grafting. There is a left atrial appendage clamp. 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 No adenopathy appreciable. There is aortic atherosclerosis. No bone lesions. IMPRESSION: Postoperative changes with stable cardiac prominence. Small right pleural effusion with suspected infiltrate and atelectasis right base. Small left pleural effusion noted previously no longer evident. Aortic Atherosclerosis (ICD10-I70.0). Electronically Signed   By: Bretta Bang III M.D.   On: 05/13/2020 06:29   ECHOCARDIOGRAM COMPLETE  Result Date: 05/12/2020    ECHOCARDIOGRAM REPORT   Patient Name:   [facility] Date of Exam: 05/12/2020 Medical Rec #:  [id number]  Height:       74.0 in Accession #:    [id number] Weight:       161.2 lb Date of Birth:  24-Jun-1946 BSA:          1.983 m Patient Age:    73 years   BP:           106/84 mmHg Patient Gender: M          HR:           73 bpm. Exam Location:  Inpatient Procedure: 2D Echo, Cardiac Doppler and Color Doppler Indications:    I48.0 Paroxysmal atrial fibrillation  History:        Patient has no prior history of Echocardiogram examinations.                 CHF, CAD, Prior CABG, TIA, Aortic  Valve Disease; Risk                 Factors:Hypertension.                 Aortic Valve: unknown bioprosthetic tilting disk valve is                 present in the aortic position. Procedure Date: 2015.  Sonographer:    Elmarie Shiley Dance Referring Phys: [id number] Sue Mcalexander IMPRESSIONS  1. Left ventricular ejection fraction, by estimation, is 55 to 60%. The left ventricle has normal function. The left ventricle has no regional wall motion abnormalities. There is moderate concentric left ventricular hypertrophy. Left ventricular diastolic parameters are indeterminate.  2. Right ventricular systolic function is normal. The right ventricular size is normal. There is moderately elevated pulmonary artery systolic pressure.  3. Left atrial size was severely dilated.  4. Right atrial size was severely dilated.  5. The mitral valve is normal in structure. Mild mitral valve regurgitation. No evidence of mitral stenosis.  6. There is thickening of the septal leaflet. The tricuspid valve is abnormal.  7. Perivalvular thickening around the bioprosthetic aortic valve. The valve appears to be stable and there is no rocking motion. In the setting of bacteremia concern for endocarditis/abscess. TEE recommended.. The aortic valve has been repaired/replaced. Aortic valve regurgitation is not visualized. No aortic stenosis is present. There is a unknown bioprosthetic tilting disk valve present in the aortic position. Procedure Date: 2015. Echo findings are consistent  with normal structure and function of the aortic valve prosthesis. Aortic valve area, by VTI measures 1.39 cm. Aortic valve mean gradient measures 10.0 mmHg. Aortic valve Vmax measures 2.16 m/s.  8. Aortic dilatation noted. 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  9. The inferior vena cava is dilated in size with >50% respiratory variability, suggesting right atrial pressure of 8 mmHg. FINDINGS  Left Ventricle: Left ventricular ejection fraction, by  estimation, is 55 to 60%. The left ventricle has normal function. The left ventricle has no regional wall motion abnormalities. The left ventricular internal cavity size was normal in size. There is  moderate concentric left ventricular hypertrophy. Left ventricular diastolic parameters are indeterminate. Right Ventricle: The right ventricular size is normal. No increase in right ventricular wall thickness. Right ventricular systolic function is normal. There is moderately elevated pulmonary artery systolic pressure. The tricuspid regurgitant velocity is 3.11 m/s, and with an assumed right atrial pressure of 8 mmHg, the estimated right ventricular systolic pressure is 46.7 mmHg. Left Atrium: Left atrial size was severely dilated. Right Atrium: Right atrial size was severely dilated. Pericardium: There is no evidence of pericardial effusion. Mitral Valve: The mitral valve is normal in structure. Normal mobility of the mitral valve leaflets. Severe mitral annular calcification. Mild mitral valve regurgitation. No evidence of mitral valve stenosis. Tricuspid Valve: There is thickening of the septal leaflet. The tricuspid valve is abnormal. Tricuspid valve regurgitation is mild . No evidence of tricuspid stenosis. Aortic Valve: Perivalvular thickening around the bioprosthetic aortic valve. The valve appears to be stable and there is no rocking motion. In the setting of bacteremia concern for endocarditis/abscess. TEE recommended. The aortic valve has been repaired/replaced.. There is mild thickening of the aortic valve. Aortic valve regurgitation is not visualized. No aortic stenosis is present. There is mild thickening of the aortic valve. Aortic valve mean gradient measures 10.0 mmHg. Aortic valve peak gradient measures 18.7 mmHg. Aortic valve area, by VTI measures 1.39 cm. There is a unknown bioprosthetic tilting disk valve present in the aortic position. Procedure Date: 2015. Echo findings are consistent with  normal structure and function of the aortic valve prosthesis. Pulmonic Valve: The pulmonic valve was normal in structure. Pulmonic valve regurgitation is not visualized. No evidence of pulmonic stenosis. Aorta: Aortic dilatation noted. 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 Venous: The inferior vena cava is dilated in size with greater than 50% respiratory variability, suggesting right atrial pressure of 8 mmHg. IAS/Shunts: No atrial level shunt detected by color flow Doppler.  LEFT VENTRICLE PLAX 2D LVIDd:         5.50 cm LVIDs:         3.90 cm LV PW:         1.50 cm LV IVS:        1.40 cm LVOT diam:     2.40 cm LV SV:         48 LV SV Index:   24 LVOT Area:     4.52 cm  RIGHT VENTRICLE            IVC RV Basal diam:  3.30 cm    IVC diam: 3.10 cm RV Mid diam:    3.30 cm RV S prime:     8.04 cm/s TAPSE (M-mode): 1.0 cm LEFT ATRIUM              Index        RIGHT ATRIUM  Index LA diam:        5.70 cm  2.87 cm/m   RA Area:     29.70 cm LA Vol (A2C):   192.0 ml 96.82 ml/m  RA Volume:   110.00 ml 55.47 ml/m LA Vol (A4C):   219.0 ml 110.43 ml/m LA Biplane Vol: 213.0 ml 107.41 ml/m  AORTIC VALVE AV Area (Vmax):    1.39 cm AV Area (Vmean):   1.39 cm AV Area (VTI):     1.39 cm AV Vmax:           216.00 cm/s AV Vmean:          147.500 cm/s AV VTI:            0.342 m AV Peak Grad:      18.7 mmHg AV Mean Grad:      10.0 mmHg LVOT Vmax:         66.15 cm/s LVOT Vmean:        45.300 cm/s LVOT VTI:          0.105 m LVOT/AV VTI ratio: 0.31  AORTA Ao Root diam: 4.20 cm Ao Asc diam:  3.80 cm MITRAL VALVE                TRICUSPID VALVE MV Area (PHT): 3.65 cm     TR Peak grad:   38.7 mmHg MV Decel Time: 208 msec     TR Vmax:        311.00 cm/s MV E velocity: 196.00 cm/s MV A velocity: 50.70 cm/s   SHUNTS MV E/A ratio:  3.87         Systemic VTI:  0.10 m                             Systemic Diam: 2.40 cm Chilton Si MD Electronically signed by Chilton Si MD Signature Date/Time:  05/12/2020/5:59:32 PM    Final     Scheduled Meds: . [MAR Hold] Chlorhexidine Gluconate Cloth  6 each Topical Daily  . [MAR Hold] mouth rinse  15 mL Mouth Rinse BID   Continuous Infusions: . [MAR Hold] sodium chloride 0  (05/11/20 1310)  . [MAR Hold] ceFEPime (MAXIPIME) IV Stopped (05/13/20 0330)  . [MAR Hold] vancomycin Stopped (05/13/20 0650)     LOS: 2 days    Time spent: 35 mins.    Cipriano Bunker, MD [facility]   If 7PM-7AM, please contact night-coverage

## 2020-05-13 NOTE — Progress Notes (Signed)
eLink Physician-Brief Progress Note Patient Name: Carlos Welch DOB: [birth year] MRN: [id number]   Date of Service  05/13/2020  HPI/Events of Note  Chronic back pain, screaming. Not due for his morphine till 7.30 CT abdomen on 28 th no acute findings.Hg stable.   eICU Interventions  Low dose dilaudid 0.5 mg once ordered. On nasal o2 at 2 lit, thin built.      Intervention Category Intermediate Interventions: Pain - evaluation and management  Ranee Gosselin 05/13/2020, 6:57 AM

## 2020-05-13 NOTE — Progress Notes (Addendum)
Progress Note  Patient Name: Carlos Welch Date of Encounter: 05/13/2020  Primary Cardiologist: Thurmon Fair, MD (new)   Subjective   Feeling better today. Anticipate TEE>>carelink to transport to [facility]   Inpatient Medications    Scheduled Meds:  Chlorhexidine Gluconate Cloth  6 each Topical Daily   iohexol       mouth rinse  15 mL Mouth Rinse BID   Continuous Infusions:  sodium chloride Stopped (05/11/20 1310)   ceFEPime (MAXIPIME) IV Stopped (05/13/20 0330)   norepinephrine (LEVOPHED) Adult infusion Stopped (05/12/20 2024)   vancomycin Stopped (05/13/20 0650)   PRN Meds: sodium chloride, acetaminophen **OR** acetaminophen, albuterol, morphine injection, [DISCONTINUED] ondansetron **OR** ondansetron (ZOFRAN) IV, polyethylene glycol   Vital Signs    Vitals:   05/13/20 0500 05/13/20 0600 05/13/20 0700 05/13/20 0800  BP: 107/79 118/79  114/84  Pulse: 67 75 45 78  Resp: 20 17 17 15   Temp:      TempSrc:      SpO2: 99% 99% 98% 99%  Weight:      Height:        Intake/Output Summary (Last 24 hours) at 05/13/2020 0832 Last data filed at 05/13/2020 0700 Gross per 24 hour  Intake 330.83 ml  Output 650 ml  Net -319.17 ml   Filed Weights   05/11/20 0745  Weight: 73.1 kg    Physical Exam   General: Well developed, well nourished, NAD Neck: Negative for carotid bruits. No JVD Lungs:Clear to ausculation bilaterally. Breathing is unlabored. Cardiovascular: Irregularly irregular with S1 S2. No murmurs, rubs, gallops, or LV heave appreciated. Abdomen: Soft, non-tender, non-distended. No obvious abdominal masses. Extremities: No edema. Radial pulses 2+ bilaterally Neuro: Alert and oriented. No focal deficits. No facial asymmetry. MAE spontaneously. Psych: Responds to questions appropriately with normal affect.    Labs    Chemistry Recent Labs  Lab 05/12/20 0400 05/12/20 1631 05/13/20 0205  NA 137 137 134*  K 3.3* 3.4* 3.7  CL 107 107 106  CO2 20* 22  18*  GLUCOSE 101* 157* 106*  BUN 40* 40* 34*  CREATININE 1.04 0.96 0.89  CALCIUM 8.1* 7.9* 8.0*  PROT 5.7* 6.2* 5.6*  ALBUMIN 2.8* 3.0* 2.7*  AST 48* 46* 36  ALT 31 32 27  ALKPHOS 76 86 72  BILITOT 1.0 1.0 0.7  GFRNONAA >60 >60 >60  GFRAA >60 >60 >60  ANIONGAP 10 8 10      Hematology Recent Labs  Lab 05/12/20 0400 05/12/20 1631 05/13/20 0205  WBC 5.9 6.1 4.4  RBC 3.64* 3.73* 3.49*  HGB 8.6* 8.9* 8.4*  HCT 29.8* 30.6* 29.5*  MCV 81.9 82.0 84.5  MCH 23.6* 23.9* 24.1*  MCHC 28.9* 29.1* 28.5*  RDW 19.4* 19.3* 19.3*  PLT 99* 109* 108*    Cardiac EnzymesNo results for input(s): TROPONINI in the last 168 hours. No results for input(s): TROPIPOC in the last 168 hours.   BNP Recent Labs  Lab 05/11/20 0736  BNP 2,352.4*     DDimer No results for input(s): DDIMER in the last 168 hours.   Radiology    CT CHEST WO CONTRAST  Result Date: 05/12/2020 CLINICAL DATA:  Abnormal x-ray, history of pulmonary abnormalities on chest x-ray EXAM: CT CHEST WITHOUT CONTRAST TECHNIQUE: Multidetector CT imaging of the chest was performed following the standard protocol without IV contrast. COMPARISON:  Chest x-ray May 11, 2020 FINDINGS: Cardiovascular: Post CABG with extensive native coronary artery calcification and aortic valve replacement. Generalized atheromatous plaque in the thoracic aorta  without aneurysmal dilation. Heart size is enlarged without pericardial effusion. Limited assessment of heart and great vessels due to lack of intravenous contrast. Mediastinum/Nodes: Scattered mediastinal lymph nodes upper limits of normal, likely related to reactive changes in longstanding cardiac dysfunction. No frank adenopathy. There is however some enlargement of venous confluence on the LEFT of the pulmonary veins entering the LEFT atrium. This may represent a common pulmonary venous trunk on the LEFT. Post LEFT atrial appendage clipping Lungs/Pleura: Signs of septal thickening. Small RIGHT effusion  showing loculation. Also with small loculated LEFT pleural effusion. Scattered areas of volume loss and linear opacity at the lung bases predominantly. Upper Abdomen: Low-density hepatic lesions measuring water density compatible with small cysts but incompletely imaged with respect to the largest in the central LEFT hemi liver. Extensive vascular calcifications track into the abdominal aorta. Adreniform thickening of bilateral adrenal glands. No acute upper abdominal process. Musculoskeletal: Osteopenia.  Spinal degenerative changes. IMPRESSION: 1. Signs of pulmonary edema and parenchymal scarring. 2. Loculated bilateral small pleural effusions RIGHT greater than LEFT with ovoid opacity in the RIGHT lung base measuring 2.6 x 1.6 cm, likely rounded atelectasis in the setting of chronic appearing effusions. Consider follow-up or comparison with prior studies to ensure resolution and exclude possibility of pulmonary neoplasm. 3. 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 4. Post LEFT atrial appendage clipping. 5. Aortic atherosclerosis. Aortic Atherosclerosis (ICD10-I70.0). Electronically Signed   By: Donzetta Kohut M.D.   On: 05/12/2020 09:29   CT ABDOMEN PELVIS W CONTRAST  Result Date: 05/13/2020 CLINICAL DATA:  Abdominal pain EXAM: CT ABDOMEN AND PELVIS WITH CONTRAST TECHNIQUE: Multidetector CT imaging of the abdomen and pelvis was performed using the standard protocol following bolus administration of intravenous contrast. CONTRAST:  OMNIPAQUE IOHEXOL 300 MG/ML  SOLN COMPARISON:  None. FINDINGS: Lower chest: Lung bases demonstrate bilateral pleural effusions and septal thickening similar to that seen on recent CT of the chest. Hepatobiliary: Fatty infiltration of the liver is noted. Scattered hypodensities are noted throughout the liver consistent with small cysts. The gallbladder is partially distended. Mild Peri cholecystic fluid is noted. No definitive  gallstones are seen. Pancreas: Unremarkable. No pancreatic ductal dilatation or surrounding inflammatory changes. Spleen: Normal in size without focal abnormality. Adrenals/Urinary Tract: Adrenal glands show some hypertrophy in the left adrenal. The kidneys demonstrate a normal enhancement pattern. Mild perinephric stranding is noted worse on the left than the right. Delayed images show normal excretion of contrast material. The bladder is well distended. Stomach/Bowel: Mild fecal material is noted within the colon. No obstructive or inflammatory changes are seen. The appendix is not well visualized. No inflammatory changes to suggest appendicitis are seen. Small bowel and stomach appear unremarkable. Vascular/Lymphatic: Aortic atherosclerosis. No enlarged abdominal or pelvic lymph nodes. Retroaortic left renal vein is noted. Reproductive: Prostate is unremarkable. Other: No abdominal wall hernia or abnormality. No abdominopelvic ascites. Musculoskeletal: Degenerative changes of lumbar spine are noted. IMPRESSION: Bilateral pleural effusions and septal thickening similar to that seen on recent chest CT Fatty liver. Partial distension of the gallbladder with pericholecystic fluid. If patient has right upper quadrant localized pain ultrasound may be helpful for further evaluation. No other definitive acute abnormality is seen. Electronically Signed   By: Alcide Clever M.D.   On: 05/13/2020 00:05   DG Chest Port 1 View  Result Date: 05/13/2020 CLINICAL DATA:  Hypoxia EXAM: PORTABLE CHEST 1 VIEW COMPARISON:  Chest radiograph May 11, 2020 and chest CT May 12, 2020  FINDINGS: There is persistent pleural effusion on the right. Smaller pleural effusion present 1 day prior on the left not appreciable on current radiographic examination. There is atelectatic change in the right base with questionable superimposed pneumonia in the right base. Lungs elsewhere appear clear. There is cardiomegaly with pulmonary vascularity  normal. Patient is status post coronary artery bypass grafting. There is a left atrial appendage clamp. 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 No adenopathy appreciable. There is aortic atherosclerosis. No bone lesions. IMPRESSION: Postoperative changes with stable cardiac prominence. Small right pleural effusion with suspected infiltrate and atelectasis right base. Small left pleural effusion noted previously no longer evident. Aortic Atherosclerosis (ICD10-I70.0). Electronically Signed   By: Bretta BangWilliam  Woodruff III M.D.   On: 05/13/2020 06:29   ECHOCARDIOGRAM COMPLETE  Result Date: 05/12/2020    ECHOCARDIOGRAM REPORT   [facility] Date of Exam: 05/12/2020 Medical Rec #:  [id number]  Height:       74.0 in Accession #:    [id number] Weight:       161.2 lb Date of Birth:  06/09/1946 BSA:          1.983 m Patient Age:    73 years   BP:           106/84 mmHg Patient Gender: M          HR:           73 bpm. Exam Location:  Inpatient Procedure: 2D Echo, Cardiac Doppler and Color Doppler Indications:    I48.0 Paroxysmal atrial fibrillation  History:        Patient has no prior history of Echocardiogram examinations.                 CHF, CAD, Prior CABG, TIA, Aortic Valve Disease; Risk                 Factors:Hypertension.                 Aortic Valve: unknown bioprosthetic tilting disk valve is                 present in the aortic position. Procedure Date: 2015.  Sonographer:    Elmarie Shileyiffany Dance Referring Phys: [id number] PARDEEP KUMAR IMPRESSIONS  1. Left ventricular ejection fraction, by estimation, is 55 to 60%. The left ventricle has normal function. The left ventricle has no regional wall motion abnormalities. There is moderate concentric left ventricular hypertrophy. Left ventricular diastolic parameters are indeterminate.  2. Right ventricular systolic function is normal. The right ventricular size is normal. There is moderately elevated pulmonary artery systolic  pressure.  3. Left atrial size was severely dilated.  4. Right atrial size was severely dilated.  5. The mitral valve is normal in structure. Mild mitral valve regurgitation. No evidence of mitral stenosis.  6. There is thickening of the septal leaflet. The tricuspid valve is abnormal.  7. Perivalvular thickening around the bioprosthetic aortic valve. The valve appears to be stable and there is no rocking motion. In the setting of bacteremia concern for endocarditis/abscess. TEE recommended.. The aortic valve has been repaired/replaced. Aortic valve regurgitation is not visualized. No aortic stenosis is present. There is a unknown bioprosthetic tilting disk valve present in the aortic position. Procedure Date: 2015. Echo findings are consistent with normal structure and function of the aortic valve prosthesis. Aortic valve area, by VTI measures 1.39 cm. Aortic valve mean gradient measures 10.0 mmHg. Aortic valve Vmax measures  2.16 m/s.  8. Aortic dilatation noted. 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  9. The inferior vena cava is dilated in size with >50% respiratory variability, suggesting right atrial pressure of 8 mmHg. FINDINGS  Left Ventricle: Left ventricular ejection fraction, by estimation, is 55 to 60%. The left ventricle has normal function. The left ventricle has no regional wall motion abnormalities. The left ventricular internal cavity size was normal in size. There is  moderate concentric left ventricular hypertrophy. Left ventricular diastolic parameters are indeterminate. Right Ventricle: The right ventricular size is normal. No increase in right ventricular wall thickness. Right ventricular systolic function is normal. There is moderately elevated pulmonary artery systolic pressure. The tricuspid regurgitant velocity is 3.11 m/s, and with an assumed right atrial pressure of 8 mmHg, the estimated right ventricular systolic pressure is 46.7 mmHg. Left Atrium: Left atrial  size was severely dilated. Right Atrium: Right atrial size was severely dilated. Pericardium: There is no evidence of pericardial effusion. Mitral Valve: The mitral valve is normal in structure. Normal mobility of the mitral valve leaflets. Severe mitral annular calcification. Mild mitral valve regurgitation. No evidence of mitral valve stenosis. Tricuspid Valve: There is thickening of the septal leaflet. The tricuspid valve is abnormal. Tricuspid valve regurgitation is mild . No evidence of tricuspid stenosis. Aortic Valve: Perivalvular thickening around the bioprosthetic aortic valve. The valve appears to be stable and there is no rocking motion. In the setting of bacteremia concern for endocarditis/abscess. TEE recommended. The aortic valve has been repaired/replaced.. There is mild thickening of the aortic valve. Aortic valve regurgitation is not visualized. No aortic stenosis is present. There is mild thickening of the aortic valve. Aortic valve mean gradient measures 10.0 mmHg. Aortic valve peak gradient measures 18.7 mmHg. Aortic valve area, by VTI measures 1.39 cm. There is a unknown bioprosthetic tilting disk valve present in the aortic position. Procedure Date: 2015. Echo findings are consistent with normal structure and function of the aortic valve prosthesis. Pulmonic Valve: The pulmonic valve was normal in structure. Pulmonic valve regurgitation is not visualized. No evidence of pulmonic stenosis. Aorta: Aortic dilatation noted. 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 Venous: The inferior vena cava is dilated in size with greater than 50% respiratory variability, suggesting right atrial pressure of 8 mmHg. IAS/Shunts: No atrial level shunt detected by color flow Doppler.  LEFT VENTRICLE PLAX 2D LVIDd:         5.50 cm LVIDs:         3.90 cm LV PW:         1.50 cm LV IVS:        1.40 cm LVOT diam:     2.40 cm LV SV:         48 LV SV Index:   24 LVOT Area:     4.52 cm  RIGHT  VENTRICLE            IVC RV Basal diam:  3.30 cm    IVC diam: 3.10 cm RV Mid diam:    3.30 cm RV S prime:     8.04 cm/s TAPSE (M-mode): 1.0 cm LEFT ATRIUM              Index        RIGHT ATRIUM           Index LA diam:        5.70 cm  2.87 cm/m   RA Area:  29.70 cm LA Vol (A2C):   192.0 ml 96.82 ml/m  RA Volume:   110.00 ml 55.47 ml/m LA Vol (A4C):   219.0 ml 110.43 ml/m LA Biplane Vol: 213.0 ml 107.41 ml/m  AORTIC VALVE AV Area (Vmax):    1.39 cm AV Area (Vmean):   1.39 cm AV Area (VTI):     1.39 cm AV Vmax:           216.00 cm/s AV Vmean:          147.500 cm/s AV VTI:            0.342 m AV Peak Grad:      18.7 mmHg AV Mean Grad:      10.0 mmHg LVOT Vmax:         66.15 cm/s LVOT Vmean:        45.300 cm/s LVOT VTI:          0.105 m LVOT/AV VTI ratio: 0.31  AORTA Ao Root diam: 4.20 cm Ao Asc diam:  3.80 cm MITRAL VALVE                TRICUSPID VALVE MV Area (PHT): 3.65 cm     TR Peak grad:   38.7 mmHg MV Decel Time: 208 msec     TR Vmax:        311.00 cm/s MV E velocity: 196.00 cm/s MV A velocity: 50.70 cm/s   SHUNTS MV E/A ratio:  3.87         Systemic VTI:  0.10 m                             Systemic Diam: 2.40 cm Chilton Si MD Electronically signed by Chilton Si MD Signature Date/Time: 05/12/2020/5:59:32 PM    Final    Telemetry    05/13/20 AF with rates in the 70-80 - Personally Reviewed  ECG    No new tracing as of 05/13/20- Personally Reviewed  Cardiac Studies   Echo 05/12/20:  1. Left ventricular ejection fraction, by estimation, is 55 to 60%. The  left ventricle has normal function. The left ventricle has no regional  wall motion abnormalities. There is moderate concentric left ventricular  hypertrophy. Left ventricular  diastolic parameters are indeterminate.  2. Right ventricular systolic function is normal. The right ventricular  size is normal. There is moderately elevated pulmonary artery systolic  pressure.  3. Left atrial size was severely dilated.  4.  Right atrial size was severely dilated.  5. The mitral valve is normal in structure. Mild mitral valve  regurgitation. No evidence of mitral stenosis.  6. There is thickening of the septal leaflet. The tricuspid valve is  abnormal.  7. Perivalvular thickening around the bioprosthetic aortic valve. The  valve appears to be stable and there is no rocking motion. In the setting  of bacteremia concern for endocarditis/abscess. TEE recommended.. The  aortic valve has been  repaired/replaced. Aortic valve regurgitation is not visualized. No aortic  stenosis is present. There is a unknown bioprosthetic tilting disk valve  present in the aortic position. Procedure Date: 2015. Echo findings are  consistent with normal structure  and function of the aortic valve prosthesis. Aortic valve area, by VTI  measures 1.39 cm. Aortic valve mean gradient measures 10.0 mmHg. Aortic  valve Vmax measures 2.16 m/s.  8. Aortic dilatation noted. There is mild dilatation of the ascending  aorta measuring 38 mm.  9. The inferior vena  cava is dilated in size with >50% respiratory  variability, suggesting right atrial pressure of 8 mmHg.   Patient Profile     74 y.o. 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 Lucianne Muss.  Assessment & Plan    1. Sepsis secondary to community acquired pneumonia>>now with questionable perivalvular thickening concerning for endocarditis/abcess: -Also has a hx of prior MRSA bacteremia and jaw abscess -Underwent echocardiogram which showed perivalvular thickening around the bioprosthetic aortic valve>>stable with no rocking motion however  concerning for endocarditis/abcess with plans for TEE today for further evaluation  -Currently being treated with Abx per primary team  2. SOB, suspected due to acute on chronic combined CHF: -Stabilized today, no SOB  -Echo as above with stable LV function -Appears euvolemic on exam today    3. Mildly elevated troponin with known hx of CAD s/p CABG: -Likely in the setting of demand ischemia with sepsis and no reports of chest pain  -No ASA given concomitant Eliquis use and anemia/thrombocytopenia  4. Anemia/thrombocytopenia: -Hb today, 8.4>>>down from 8.9 -Management per primary team   5. Suspected chronic atrial fibrillation: -Rates controlled in the 70-80 -Continue Eliquis>>Hb 8.4>>down from 8.9 -Severely dilated RA/LA on echo   6. H/o pericardial AVR -See plan for TEE for follow up of questionable AVR mass concering for possoble endocarditis/abcess   7. Hypokalemia -Stabilized, K+ 3.7 today    Signed, Georgie Chard NP-C [facility] Pager: [phone] 05/13/2020, 8:32 AM     For questions or updates, please contact   Please consult [email or web address] for contact info under Cardiology/STEMI.   I have seen and examined the patient along with Georgie Chard NP-C.  I have reviewed the chart, notes and new data.  I agree with PA/NP's note.  Key new complaints: Feels better.  Denies dyspnea at this time.  No longer has watery stools, had a formed stool earlier.  Has not had angina or palpitations. Key examination changes: Irregular rhythm, holosystolic murmurs at the apex and at the left lower sternal border Key new findings / data: TEE shows no evidence of perivalvular abscess and normal function of the aortic valve prosthesis, no vegetation seen.  He does have moderate to severe MR and moderate to severe TR.  The mitral valve appears thickened and restricted.  Both atria are severely dilated.  PLAN: No evidence of prosthetic valve endocarditis. 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 Blood cultures negative to date. He has significant mitral insufficiency.  Unfortunately he is not a candidate for redo surgery for severe mitral regurgitation.  Valve anatomy is not amenable to MitraClip.  We will manage medically with diuretics and heart unloading medications.  Start ARB. Severe biatrial dilation is consistent with the suspicion that he has permanent atrial fibrillation.    Thurmon Fair, MD, [facility] [phone] 05/13/2020, 3:27 PM

## 2020-05-14 ENCOUNTER — Encounter (HOSPITAL_COMMUNITY): Payer: Self-pay | Admitting: Cardiology

## 2020-05-14 DIAGNOSIS — J189 Pneumonia, unspecified organism: Secondary | ICD-10-CM | POA: Diagnosis not present

## 2020-05-14 DIAGNOSIS — A419 Sepsis, unspecified organism: Secondary | ICD-10-CM | POA: Diagnosis not present

## 2020-05-14 DIAGNOSIS — I482 Chronic atrial fibrillation, unspecified: Secondary | ICD-10-CM | POA: Diagnosis not present

## 2020-05-14 DIAGNOSIS — I5041 Acute combined systolic (congestive) and diastolic (congestive) heart failure: Secondary | ICD-10-CM | POA: Diagnosis not present

## 2020-05-14 LAB — COMPREHENSIVE METABOLIC PANEL
ALT: 33 U/L (ref 0–44)
AST: 41 U/L (ref 15–41)
Albumin: 2.8 g/dL — ABNORMAL LOW (ref 3.5–5.0)
Alkaline Phosphatase: 76 U/L (ref 38–126)
Anion gap: 5 (ref 5–15)
BUN: 30 mg/dL — ABNORMAL HIGH (ref 8–23)
CO2: 19 mmol/L — ABNORMAL LOW (ref 22–32)
Calcium: 8.1 mg/dL — ABNORMAL LOW (ref 8.9–10.3)
Chloride: 108 mmol/L (ref 98–111)
Creatinine, Ser: 0.82 mg/dL (ref 0.61–1.24)
GFR calc Af Amer: >60 mL/min (ref >60)
GFR calc non Af Amer: >60 mL/min (ref >60)
Glucose, Bld: 94 mg/dL (ref 70–99)
Potassium: 3.6 mmol/L (ref 3.5–5.1)
Sodium: 132 mmol/L — ABNORMAL LOW (ref 135–145)
Total Bilirubin: 1 mg/dL (ref 0.3–1.2)
Total Protein: 5.9 g/dL — ABNORMAL LOW (ref 6.5–8.1)

## 2020-05-14 LAB — MAGNESIUM: Magnesium: 1.9 mg/dL (ref 1.7–2.4)

## 2020-05-14 LAB — CBC
HCT: 31.8 % — ABNORMAL LOW (ref 39.0–52.0)
Hemoglobin: 8.8 g/dL — ABNORMAL LOW (ref 13.0–17.0)
MCH: 23.5 pg — ABNORMAL LOW (ref 26.0–34.0)
MCHC: 27.7 g/dL — ABNORMAL LOW (ref 30.0–36.0)
MCV: 85 fL (ref 80.0–100.0)
Platelets: 115 10*3/uL — ABNORMAL LOW (ref 150–400)
RBC: 3.74 MIL/uL — ABNORMAL LOW (ref 4.22–5.81)
RDW: 19.3 % — ABNORMAL HIGH (ref 11.5–15.5)
WBC: 3.7 10*3/uL — ABNORMAL LOW (ref 4.0–10.5)
nRBC: 0 % (ref 0.0–0.2)

## 2020-05-14 LAB — PHOSPHORUS: Phosphorus: 2.5 mg/dL (ref 2.5–4.6)

## 2020-05-14 MED ORDER — APIXABAN 5 MG PO TABS
5.0000 mg | ORAL_TABLET | Freq: Two times a day (BID) | ORAL | Status: DC
  Administered 2020-05-14 – 2020-05-17 (×6): 5 mg via ORAL
  Filled 2020-05-14 (×6): qty 1

## 2020-05-14 MED ORDER — HYDRALAZINE HCL 20 MG/ML IJ SOLN
10.0000 mg | Freq: Four times a day (QID) | INTRAMUSCULAR | Status: DC | PRN
  Administered 2020-05-14 – 2020-05-16 (×5): 10 mg via INTRAVENOUS
  Filled 2020-05-14 (×5): qty 1

## 2020-05-14 NOTE — Progress Notes (Addendum)
Progress Note  Patient Name: Carlos Welch Date of Encounter: 05/14/2020  Primary Cardiologist: Thurmon Fair, MD(new)  Subjective   Doing well today. No specific complaints.   Inpatient Medications    Scheduled Meds: . Chlorhexidine Gluconate Cloth  6 each Topical Daily  . losartan  12.5 mg Oral Daily  . mouth rinse  15 mL Mouth Rinse BID   Continuous Infusions: . sodium chloride 0  (05/11/20 1310)  . ceFEPime (MAXIPIME) IV Stopped (05/14/20 0239)  . vancomycin 1,000 mg (05/14/20 0605)   PRN Meds: sodium chloride, acetaminophen **OR** acetaminophen, albuterol, diphenhydrAMINE, morphine injection, [DISCONTINUED] ondansetron **OR** ondansetron (ZOFRAN) IV, polyethylene glycol, traZODone   Vital Signs    Vitals:   05/14/20 0345 05/14/20 0400 05/14/20 0500 05/14/20 0606  BP:    (!) 125/91  Pulse:  71 79 74  Resp:  13 22 17   Temp: 97.8 F (36.6 C)     TempSrc: Oral     SpO2:  100% 100% 97%  Weight:      Height:        Intake/Output Summary (Last 24 hours) at 05/14/2020 0737 Last data filed at 05/14/2020 0606 Gross per 24 hour  Intake 881.17 ml  Output 735 ml  Net 146.17 ml   Filed Weights   05/11/20 0745 05/13/20 0925  Weight: 73.1 kg 77.1 kg    Physical Exam   General: Well developed, well nourished, NAD Neck: Negative for carotid bruits. No JVD Lungs:Clear to ausculation bilaterally. No wheezes, rales, or rhonchi. Breathing is unlabored. Cardiovascular: Irregularly irregular with S1 S2.  Abdomen: Soft, non-tender, non-distended. No obvious abdominal masses. Extremities: No edema. Radial pulses 2+ bilaterally Neuro: Alert and oriented. No focal deficits. No facial asymmetry. MAE spontaneously. Psych: Responds to questions appropriately with normal affect.    Labs    Chemistry Recent Labs  Lab 05/12/20 1631 05/13/20 0205 05/14/20 0212  NA 137 134* 132*  K 3.4* 3.7 3.6  CL 107 106 108  CO2 22 18* 19*  GLUCOSE 157* 106* 94  BUN 40* 34* 30*    CREATININE 0.96 0.89 0.82  CALCIUM 7.9* 8.0* 8.1*  PROT 6.2* 5.6* 5.9*  ALBUMIN 3.0* 2.7* 2.8*  AST 46* 36 41  ALT 32 27 33  ALKPHOS 86 72 76  BILITOT 1.0 0.7 1.0  GFRNONAA >60 >60 >60  GFRAA >60 >60 >60  ANIONGAP 8 10 5      Hematology Recent Labs  Lab 05/12/20 0400 05/12/20 1631 05/13/20 0205  WBC 5.9 6.1 4.4  RBC 3.64* 3.73* 3.49*  HGB 8.6* 8.9* 8.4*  HCT 29.8* 30.6* 29.5*  MCV 81.9 82.0 84.5  MCH 23.6* 23.9* 24.1*  MCHC 28.9* 29.1* 28.5*  RDW 19.4* 19.3* 19.3*  PLT 99* 109* 108*    Cardiac EnzymesNo results for input(s): TROPONINI in the last 168 hours. No results for input(s): TROPIPOC in the last 168 hours.   BNP Recent Labs  Lab 05/11/20 0736  BNP 2,352.4*     DDimer No results for input(s): DDIMER in the last 168 hours.   Radiology    CT CHEST WO CONTRAST  Result Date: 05/12/2020 CLINICAL DATA:  Abnormal x-ray, history of pulmonary abnormalities on chest x-ray EXAM: CT CHEST WITHOUT CONTRAST TECHNIQUE: Multidetector CT imaging of the chest was performed following the standard protocol without IV contrast. COMPARISON:  Chest x-ray May 11, 2020 FINDINGS: Cardiovascular: Post CABG with extensive native coronary artery calcification and aortic valve replacement. Generalized atheromatous plaque in the thoracic aorta without aneurysmal  dilation. Heart size is enlarged without pericardial effusion. Limited assessment of heart and great vessels due to lack of intravenous contrast. Mediastinum/Nodes: Scattered mediastinal lymph nodes upper limits of normal, likely related to reactive changes in longstanding cardiac dysfunction. No frank adenopathy. There is however some enlargement of venous confluence on the LEFT of the pulmonary veins entering the LEFT atrium. This may represent a common pulmonary venous trunk on the LEFT. Post LEFT atrial appendage clipping Lungs/Pleura: Signs of septal thickening. Small RIGHT effusion showing loculation. Also with small loculated  LEFT pleural effusion. Scattered areas of volume loss and linear opacity at the lung bases predominantly. Upper Abdomen: Low-density hepatic lesions measuring water density compatible with small cysts but incompletely imaged with respect to the largest in the central LEFT hemi liver. Extensive vascular calcifications track into the abdominal aorta. Adreniform thickening of bilateral adrenal glands. No acute upper abdominal process. Musculoskeletal: Osteopenia.  Spinal degenerative changes. IMPRESSION: 1. Signs of pulmonary edema and parenchymal scarring. 2. Loculated bilateral small pleural effusions RIGHT greater than LEFT with ovoid opacity in the RIGHT lung base measuring 2.6 x 1.6 cm, likely rounded atelectasis in the setting of chronic appearing effusions. Consider follow-up or comparison with prior studies to ensure resolution and exclude possibility of pulmonary neoplasm. 3. 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 4. Post LEFT atrial appendage clipping. 5. Aortic atherosclerosis. Aortic Atherosclerosis (ICD10-I70.0). Electronically Signed   By: Donzetta KohutGeoffrey  Wile M.D.   On: 05/12/2020 09:29   CT ABDOMEN PELVIS W CONTRAST  Result Date: 05/13/2020 CLINICAL DATA:  Abdominal pain EXAM: CT ABDOMEN AND PELVIS WITH CONTRAST TECHNIQUE: Multidetector CT imaging of the abdomen and pelvis was performed using the standard protocol following bolus administration of intravenous contrast. CONTRAST:  100mL OMNIPAQUE IOHEXOL 300 MG/ML  SOLN COMPARISON:  None. FINDINGS: Lower chest: Lung bases demonstrate bilateral pleural effusions and septal thickening similar to that seen on recent CT of the chest. Hepatobiliary: Fatty infiltration of the liver is noted. Scattered hypodensities are noted throughout the liver consistent with small cysts. The gallbladder is partially distended. Mild Peri cholecystic fluid is noted. No definitive gallstones are seen. Pancreas: Unremarkable.  No pancreatic ductal dilatation or surrounding inflammatory changes. Spleen: Normal in size without focal abnormality. Adrenals/Urinary Tract: Adrenal glands show some hypertrophy in the left adrenal. The kidneys demonstrate a normal enhancement pattern. Mild perinephric stranding is noted worse on the left than the right. Delayed images show normal excretion of contrast material. The bladder is well distended. Stomach/Bowel: Mild fecal material is noted within the colon. No obstructive or inflammatory changes are seen. The appendix is not well visualized. No inflammatory changes to suggest appendicitis are seen. Small bowel and stomach appear unremarkable. Vascular/Lymphatic: Aortic atherosclerosis. No enlarged abdominal or pelvic lymph nodes. Retroaortic left renal vein is noted. Reproductive: Prostate is unremarkable. Other: No abdominal wall hernia or abnormality. No abdominopelvic ascites. Musculoskeletal: Degenerative changes of lumbar spine are noted. IMPRESSION: Bilateral pleural effusions and septal thickening similar to that seen on recent chest CT Fatty liver. Partial distension of the gallbladder with pericholecystic fluid. If patient has right upper quadrant localized pain ultrasound may be helpful for further evaluation. No other definitive acute abnormality is seen. Electronically Signed   By: Alcide CleverMark  Lukens M.D.   On: 05/13/2020 00:05   DG Chest Port 1 View  Result Date: 05/13/2020 CLINICAL DATA:  Hypoxia EXAM: PORTABLE CHEST 1 VIEW COMPARISON:  Chest radiograph May 11, 2020 and chest CT May 12, 2020 FINDINGS: There  is persistent pleural effusion on the right. Smaller pleural effusion present 1 day prior on the left not appreciable on current radiographic examination. There is atelectatic change in the right base with questionable superimposed pneumonia in the right base. Lungs elsewhere appear clear. There is cardiomegaly with pulmonary vascularity normal. Patient is status post coronary artery  bypass grafting. There is a left atrial appendage clamp. 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 No adenopathy appreciable. There is aortic atherosclerosis. No bone lesions. IMPRESSION: Postoperative changes with stable cardiac prominence. Small right pleural effusion with suspected infiltrate and atelectasis right base. Small left pleural effusion noted previously no longer evident. Aortic Atherosclerosis (ICD10-I70.0). Electronically Signed   By: Bretta Bang III M.D.   On: 05/13/2020 06:29   ECHOCARDIOGRAM COMPLETE  Result Date: 05/12/2020    ECHOCARDIOGRAM REPORT   Patient Name:   Carlos Welch Date of Exam: 05/12/2020 Medical Rec #:  [id number]  Height:       74.0 in Accession #:    [id number] Weight:       161.2 lb Date of Birth:  03/02/1946 BSA:          1.983 m Patient Age:    73 years   BP:           106/84 mmHg Patient Gender: M          HR:           73 bpm. Exam Location:  Inpatient Procedure: 2D Echo, Cardiac Doppler and Color Doppler Indications:    I48.0 Paroxysmal atrial fibrillation  History:        Patient has no prior history of Echocardiogram examinations.                 CHF, CAD, Prior CABG, TIA, Aortic Valve Disease; Risk                 Factors:Hypertension.                 Aortic Valve: unknown bioprosthetic tilting disk valve is                 present in the aortic position. Procedure Date: 2015.  Sonographer:    Elmarie Shiley Dance Referring Phys: [id number] PARDEEP KUMAR IMPRESSIONS  1. Left ventricular ejection fraction, by estimation, is 55 to 60%. The left ventricle has normal function. The left ventricle has no regional wall motion abnormalities. There is moderate concentric left ventricular hypertrophy. Left ventricular diastolic parameters are indeterminate.  2. Right ventricular systolic function is normal. The right ventricular size is normal. There is moderately elevated pulmonary artery systolic pressure.  3. Left atrial size was severely dilated.   4. Right atrial size was severely dilated.  5. The mitral valve is normal in structure. Mild mitral valve regurgitation. No evidence of mitral stenosis.  6. There is thickening of the septal leaflet. The tricuspid valve is abnormal.  7. Perivalvular thickening around the bioprosthetic aortic valve. The valve appears to be stable and there is no rocking motion. In the setting of bacteremia concern for endocarditis/abscess. TEE recommended.. The aortic valve has been repaired/replaced. Aortic valve regurgitation is not visualized. No aortic stenosis is present. There is a unknown bioprosthetic tilting disk valve present in the aortic position. Procedure Date: 2015. Echo findings are consistent with normal structure and function of the aortic valve prosthesis. Aortic valve area, by VTI measures 1.39 cm. Aortic valve mean gradient measures 10.0 mmHg. Aortic valve Vmax measures 2.16 m/s.  8. Aortic dilatation noted. 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  9. The inferior vena cava is dilated in size with >50% respiratory variability, suggesting right atrial pressure of 8 mmHg. FINDINGS  Left Ventricle: Left ventricular ejection fraction, by estimation, is 55 to 60%. The left ventricle has normal function. The left ventricle has no regional wall motion abnormalities. The left ventricular internal cavity size was normal in size. There is  moderate concentric left ventricular hypertrophy. Left ventricular diastolic parameters are indeterminate. Right Ventricle: The right ventricular size is normal. No increase in right ventricular wall thickness. Right ventricular systolic function is normal. There is moderately elevated pulmonary artery systolic pressure. The tricuspid regurgitant velocity is 3.11 m/s, and with an assumed right atrial pressure of 8 mmHg, the estimated right ventricular systolic pressure is 46.7 mmHg. Left Atrium: Left atrial size was severely dilated. Right Atrium: Right atrial  size was severely dilated. Pericardium: There is no evidence of pericardial effusion. Mitral Valve: The mitral valve is normal in structure. Normal mobility of the mitral valve leaflets. Severe mitral annular calcification. Mild mitral valve regurgitation. No evidence of mitral valve stenosis. Tricuspid Valve: There is thickening of the septal leaflet. The tricuspid valve is abnormal. Tricuspid valve regurgitation is mild . No evidence of tricuspid stenosis. Aortic Valve: Perivalvular thickening around the bioprosthetic aortic valve. The valve appears to be stable and there is no rocking motion. In the setting of bacteremia concern for endocarditis/abscess. TEE recommended. The aortic valve has been repaired/replaced.. There is mild thickening of the aortic valve. Aortic valve regurgitation is not visualized. No aortic stenosis is present. There is mild thickening of the aortic valve. Aortic valve mean gradient measures 10.0 mmHg. Aortic valve peak gradient measures 18.7 mmHg. Aortic valve area, by VTI measures 1.39 cm. There is a unknown bioprosthetic tilting disk valve present in the aortic position. Procedure Date: 2015. Echo findings are consistent with normal structure and function of the aortic valve prosthesis. Pulmonic Valve: The pulmonic valve was normal in structure. Pulmonic valve regurgitation is not visualized. No evidence of pulmonic stenosis. Aorta: Aortic dilatation noted. 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 Venous: The inferior vena cava is dilated in size with greater than 50% respiratory variability, suggesting right atrial pressure of 8 mmHg. IAS/Shunts: No atrial level shunt detected by color flow Doppler.  LEFT VENTRICLE PLAX 2D LVIDd:         5.50 cm LVIDs:         3.90 cm LV PW:         1.50 cm LV IVS:        1.40 cm LVOT diam:     2.40 cm LV SV:         48 LV SV Index:   24 LVOT Area:     4.52 cm  RIGHT VENTRICLE            IVC RV Basal diam:  3.30 cm    IVC  diam: 3.10 cm RV Mid diam:    3.30 cm RV S prime:     8.04 cm/s TAPSE (M-mode): 1.0 cm LEFT ATRIUM              Index        RIGHT ATRIUM           Index LA diam:        5.70 cm  2.87 cm/m   RA Area:     29.70 cm  LA Vol (A2C):   192.0 ml 96.82 ml/m  RA Volume:   110.00 ml 55.47 ml/m LA Vol (A4C):   219.0 ml 110.43 ml/m LA Biplane Vol: 213.0 ml 107.41 ml/m  AORTIC VALVE AV Area (Vmax):    1.39 cm AV Area (Vmean):   1.39 cm AV Area (VTI):     1.39 cm AV Vmax:           216.00 cm/s AV Vmean:          147.500 cm/s AV VTI:            0.342 m AV Peak Grad:      18.7 mmHg AV Mean Grad:      10.0 mmHg LVOT Vmax:         66.15 cm/s LVOT Vmean:        45.300 cm/s LVOT VTI:          0.105 m LVOT/AV VTI ratio: 0.31  AORTA Ao Root diam: 4.20 cm Ao Asc diam:  3.80 cm MITRAL VALVE                TRICUSPID VALVE MV Area (PHT): 3.65 cm     TR Peak grad:   38.7 mmHg MV Decel Time: 208 msec     TR Vmax:        311.00 cm/s MV E velocity: 196.00 cm/s MV A velocity: 50.70 cm/s   SHUNTS MV E/A ratio:  3.87         Systemic VTI:  0.10 m                             Systemic Diam: 2.40 cm Chilton Si MD Electronically signed by Chilton Si MD Signature Date/Time: 05/12/2020/5:59:32 PM    Final    ECHO TEE  Result Date: 05/13/2020    TRANSESOPHOGEAL ECHO REPORT   Patient Name:   Carlos Welch Date of Exam: 05/13/2020 Medical Rec #:  [id number]  Height:       74.0 in Accession #:    [id number] Weight:       161.2 lb Date of Birth:  1946-05-12 BSA:          1.983 m Patient Age:    73 years   BP:           97/73 mmHg Patient Gender: M          HR:           74 bpm. Exam Location:  Inpatient Procedure: Transesophageal Echo, Cardiac Doppler and Color Doppler Indications:     Bacteremia  History:         Patient has prior history of Echocardiogram examinations, most                  recent 05/12/2020. CHF, Abnormal ECG, Aortic Valve Disease,                  Arrythmias:Atrial Fibrillation; Signs/Symptoms:Bacteremia.                   Aortic valve replacement.  Sonographer:     Sheralyn Boatman RDCS Referring Phys:  [id number] Deri Fuelling MCDANIEL Diagnosing Phys: Olga Millers MD PROCEDURE: After discussion of the risks and benefits of a TEE, an informed consent was obtained from the patient. The transesophogeal probe was passed without difficulty through the esophogus of the patient. Imaged were obtained with the patient in a left lateral decubitus position. Sedation  performed by different physician. The patient's vital signs; including heart rate, blood pressure, and oxygen saturation; remained stable throughout the procedure. The patient developed no complications during the procedure. IMPRESSIONS  1. 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  2. Left ventricular ejection fraction, by estimation, is 55 to 60%. The left ventricle has normal function. The left ventricle has no regional wall motion abnormalities. There is moderate left ventricular hypertrophy.  3. Right ventricular systolic function is normal. The right ventricular size is mildly enlarged.  4. Left atrial size was severely dilated. No left atrial/left atrial appendage thrombus was detected.  5. Right atrial size was severely dilated.  6. The mitral valve is abnormal. Moderate to severe mitral valve regurgitation.  7. Tricuspid valve regurgitation is moderate to severe.  8. The aortic valve has been repaired/replaced. Aortic valve regurgitation is not visualized. No aortic stenosis is present.  9. 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 FINDINGS  Left Ventricle: Left ventricular ejection fraction, by estimation, is 55 to 60%. The left ventricle has normal function. The left ventricle has no regional wall motion  abnormalities. The left ventricular internal cavity size was normal in size. There is  moderate left ventricular hypertrophy. Right Ventricle: The right ventricular size is mildly enlarged.Right ventricular systolic function is normal. Left Atrium: LAA clipped. Left atrial size was severely dilated. Spontaneous echo contrast was present. No left atrial/left atrial appendage thrombus was detected. Right Atrium: Right atrial size was severely dilated. Pericardium: There is no evidence of pericardial effusion. Mitral Valve: The mitral valve is abnormal. There is severe thickening of the mitral valve leaflet(s). Moderate to severe mitral valve regurgitation. MV peak gradient, 6.4 mmHg. The mean mitral valve gradient is 2.5 mmHg. Tricuspid Valve: The tricuspid valve is grossly normal. Tricuspid valve regurgitation is moderate to severe. Aortic Valve: The aortic valve has been repaired/replaced. Aortic valve regurgitation is not visualized. No aortic stenosis is present. Aortic valve mean gradient measures 8.0 mmHg. Aortic valve peak gradient measures 13.4 mmHg. There is a bioprosthetic valve present in the aortic position. Pulmonic Valve: The pulmonic valve was normal in structure. Pulmonic valve regurgitation is trivial. Aorta: The aortic root is normal in size and structure. There is moderate (Grade III) plaque involving the descending aorta. IAS/Shunts: No atrial level shunt detected by color flow Doppler. 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  AORTIC VALVE AV Vmax:      183.00 cm/s AV Vmean:     138.000 cm/s AV VTI:       0.235 m AV Peak Grad: 13.4 mmHg AV Mean Grad: 8.0 mmHg MITRAL VALVE MV Peak grad: 6.4 mmHg MV Mean grad: 2.5 mmHg MV Vmax:       1.27 m/s MV Vmean:     74.0 cm/s MR PISA:        2.26 cm MR PISA Radius: 0.60 cm Olga Millers MD Electronically signed by Olga Millers MD Signature Date/Time: 05/13/2020/2:29:55 PM    Final    US Abdomen Limited RUQ  Result  Date: 05/13/2020 CLINICAL DATA:  Abdomen pain EXAM: ULTRASOUND ABDOMEN LIMITED RIGHT UPPER QUADRANT COMPARISON:  CT 05/12/2020 FINDINGS: Gallbladder: Multiple shadowing stones. Slight increased wall thickness up to 3.5 mm. Negative sonographic Murphy. Common bile duct: Diameter: 5.2 mm Liver: Echogenicity within normal limits. Multiple cysts, the largest measured cyst in the right lobe measures 2.1 x 1.4 x 2.5 cm. Portal vein is patent on color Doppler imaging with normal direction of blood flow towards the liver. Other: Small amount of perihepatic ascites.  Right pleural effusion. IMPRESSION: 1. Cholelithiasis with slight increased gallbladder wall thickness but negative sonographic Murphy. Correlation with nuclear medicine hepatobiliary imaging could be obtained if persistent concern for acute cholecystitis. 2. Trace perihepatic ascites and right pleural effusion. Electronically Signed   By: Jasmine Pang M.D.   On: 05/13/2020 16:47   Telemetry    05/14/20 AF with rates in the 70's - Personally Reviewed  ECG    No new tracing as of 05/14/2020- Personally Reviewed  Cardiac Studies   TEE/Echo 05/13/20:  1. Normal LV function; moderate LVH; severe biatrial enlargement; mild  RVE; s/p AVR with no AS or AI; thickened MV with moderate to severe MR;  moderate to severe TR; small oscillating densities in LV cavity of  uncertain etiology; possible rupture  chordae; would be unusual location for vegetations.  2. Left ventricular ejection fraction, by estimation, is 55 to 60%. The  left ventricle has normal function. The left ventricle has no regional  wall motion abnormalities. There is moderate left ventricular hypertrophy.  3. Right ventricular systolic function is  normal. The right ventricular  size is mildly enlarged.  4. Left atrial size was severely dilated. No left atrial/left atrial  appendage thrombus was detected.  5. Right atrial size was severely dilated.  6. The mitral valve is abnormal. Moderate to severe mitral valve  regurgitation.  7. Tricuspid valve regurgitation is moderate to severe.  8. The aortic valve has been repaired/replaced. Aortic valve  regurgitation is not visualized. No aortic stenosis is present.  9. 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   Echo 05/12/20:  1. Left ventricular ejection fraction, by estimation, is 55 to 60%. The  left ventricle has normal function. The left ventricle has no regional  wall motion abnormalities. There is moderate concentric left ventricular  hypertrophy. Left ventricular  diastolic parameters are indeterminate.  2. Right ventricular systolic function is normal. The right ventricular  size is normal. There is moderately elevated pulmonary artery systolic  pressure.  3. Left atrial size was severely dilated.  4. Right atrial size was severely dilated.  5. The mitral valve is normal in structure. Mild mitral valve  regurgitation. No evidence of mitral stenosis.  6. There is thickening of the septal leaflet. The tricuspid valve is  abnormal.  7. Perivalvular thickening around the bioprosthetic aortic valve. The  valve appears to be stable and there is no rocking motion. In the setting  of bacteremia concern for endocarditis/abscess. TEE recommended.. The  aortic valve has been  repaired/replaced. Aortic valve regurgitation is not visualized. No aortic  stenosis is present. There is a unknown bioprosthetic tilting disk valve  present in the aortic position. Procedure Date: 2015. Echo findings are  consistent with normal structure  and function of the aortic valve prosthesis. Aortic valve area, by VTI  measures 1.39 cm. Aortic valve mean  gradient measures 10.0 mmHg. Aortic  valve Vmax measures 2.16 m/s.  8. Aortic dilatation noted. There is mild dilatation  of the ascending  aorta measuring 38 mm.  9. The inferior vena cava is dilated in size with >50% respiratory  variability, suggesting right atrial pressure of 8 mmHg.   Patient Profile     74 y.o. 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 Lucianne Muss.  Assessment & Plan    1. 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  -Currently being treated with Abx per primary team  2. SOB, suspected due to acute on chronic combined CHF: -Stabilized today, no SOB  -TEE/Echo as above with stable LV function -Appears euvolemic on exam today    3. Mildly elevated troponin with known hx of CAD s/p CABG: -Likely in the setting of demand ischemia with sepsis and no reports of chest pain  -No ASA given concomitant Eliquis use and anemia/thrombocytopenia  4. Anemia/thrombocytopenia: -Hb today, 8.4>>8.8 today   -Management per primary team   5. Suspected chronic atrial fibrillation: -Rates controlled in the 70-80 -Continue Eliquis -Severely dilated RA/LA on echo   6. H/o pericardial AVR: -TEE 7/29/21with stable valve>>>normal LV function; moderate LVH; severe biatrial enlargement; mild RVE; s/p AVR with no AS or AI; thickened MV with moderate to severe MR; moderate to severe TR; small oscillating densities in LV cavity of uncertain etiology; possible rupture chordae; would be unusual location for vegetations.   7. Hypokalemia -Stabilized, K+ 3.6 today   8. 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  I have seen and examined the patient along with Georgie Chard NP-C.  I have reviewed the chart, notes and new data.  I agree with PA/NP's note.  Key new complaints: no CV complaints Key examination changes: appears chronically ill, pale. Apical and LLSB holosystolic murmurs. Irregular rhythm, rate controlled Key new findings / data: blood cultures negative to date  PLAN: BP OK after starting ARB, increase dose tomorrow if tolerated. Continue to follow blood cultures, but endocarditis appears unlikely (nonspecific mobile mural LV endocardial structures seen, but no valvular vegetations or periprosthetic abscess on TEE). Will need follow up for severe MR, but he is not a candidate for MitraClip (restricted leaflets) and at least with his current functional status would be a very high risk candidate for redo sternotomy and surgical MV replacement.  Thurmon Fair, MD, [facility] [phone] 05/14/2020, 11:12 AM

## 2020-05-14 NOTE — Progress Notes (Signed)
PROGRESS NOTE    Carlos Welch  UKG:[id number] DOB: 12-28-1945 DOA: 05/11/2020 PCP: Patient, No Pcp Per    Brief Narrative: Carlos Welch a 15 Male with PMH of CHF, atrial fibrillation on Eliquis, poor outpatient follow-ups, and left against medical advice multiple times with unknown remaining history,  no records on file who presents with several day history of shortness of breath, malaise, fever. The patient reports he originally lives in [place] where he receives his medical care, but has recently moved to [place] to live with his daughter. He reports prolonged hospitalization in [facility] for subacute bacterial endocarditis, MRSA bacteremia.  His daughter reports to the ED physician that patient had prosthetic valve replacement 6 years ago and then after that he never followed up with his doctors.  He has recurrent hospitalization for CHF exacerbation but then he ended up being signing against medical advice, Patient reports orthopnea, exertional dyspnea with bilateral leg swelling.  He self titrates his Lasix medication.  He reports tmax of 102.F at home earlier today.  He was admitted from [facility] for sepsis secondary to community-acquired pneumonia and CHF exacerbation.  Cardiology consulted recommended continue antibiotics, given history of subacute bacterial endocarditis, MRSA bacteremia patient underwent TEE with normal LV function no evidence for vegetation or abscess.  CT chest shows bilateral loculated effusion with opacity concerning for malignancy, pulmonology consulted.  He had severe abdominal pain followed by transient shock requiring pressors, he was accepted in the ICU,  managed with Levophed.  Blood pressure has improved, transferred back to SDU.   Assessment & Plan:   Principal Problem:   Septic shock (HCC) Active Problems:   Community acquired pneumonia   Acute CHF (congestive heart failure) (HCC)   Atrial fibrillation, chronic (HCC)    Generalized abdominal pain   Pressure injury of skin   Sepsis secondary to community-acquired pneumonia / UTI. Sepsis present on admission, temp 102.6, respiratory rate 24, lactic acid 4.2,.   Chest x-ray rounded lateral basilar opacity may reflect nodule or rounded atelectasis. Initiated on ceftriaxone and Zithromax for community-acquired pneumonia. blood cultures: No growth so far.,  urine culture grew 3K Klebsiella pneumonia. Antibiotics changed with Vancomycin and cefepime given history of MRSA and possible valvular abscess. CT chest shows bilateral loculated effusion with opacity concerning for malignancy. Pulmonology consulted, helps with critical care management. Continue current antibiotics.   Acute shortness of breath could be secondary to CHF exacerbation BNP 2354, chest x-ray shows cardiomegaly, Appears euvolumic. 2D echocardiogram completed results LVEF 55-60% Continue Lasix 40 mg IV daily. Cardiology consulted recommended TEE  Which ruled out vegetation, valvular abscess.   Elevated troponin could be secondary to CHF exacerbation/demand ischemia. Cardiology following. Doesn't appear ACS. 2D echo- LVEF 55-60%  Thrombocytopenia . Improving  No previous labs to compare baseline platelet count. 101 - 99 - 115 - Improving   Chronic anemia : Improving  He denies any GI bleed, hemoptysis.  Will obtain a stool for occult blood. GI bleed would be less likely since hemoglobin remained stable.  A. fib with normal ventricular rate heart rate controlled, continue Coreg. Continue Eliquis for anticoagulation,    DVT prophylaxis: Apixaban Code Status: Full code Family Communication: No one at bedside, discussed with patient in detail Disposition Plan:   Disposition: Patient is from home. DC: Home with home PT versus SNF. Patient is not medically clear, pending work-up  Consultants:   Cardiology.  Procedures:   Echo , CT chest.  Antimicrobials:  Anti-infectives (From admission, onward)   Start     Dose/Rate Route Frequency Ordered Stop   05/13/20 0600  vancomycin (VANCOCIN) IVPB 1000 mg/200 mL premix     Discontinue     1,000 mg 200 mL/hr over 60 Minutes Intravenous Every 12 hours 05/12/20 1749     05/12/20 1830  vancomycin (VANCOREADY) IVPB 1500 mg/300 mL        1,500 mg 150 mL/hr over 120 Minutes Intravenous  Once 05/12/20 1749 05/12/20 2055   05/12/20 1800  ceFEPIme (MAXIPIME) 2 g in sodium chloride 0.9 % 100 mL IVPB     Discontinue     2 g 200 mL/hr over 30 Minutes Intravenous Every 8 hours 05/12/20 1749     05/12/20 1200  azithromycin (ZITHROMAX) 500 mg in sodium chloride 0.9 % 250 mL IVPB  Status:  Discontinued        500 mg 250 mL/hr over 60 Minutes Intravenous  Once 05/11/20 1707 05/12/20 0755   05/12/20 1200  azithromycin (ZITHROMAX) 500 mg in sodium chloride 0.9 % 250 mL IVPB  Status:  Discontinued        500 mg 250 mL/hr over 60 Minutes Intravenous Every 24 hours 05/12/20 0749 05/12/20 1723   05/12/20 1100  cefTRIAXone (ROCEPHIN) 1 g in sodium chloride 0.9 % 100 mL IVPB  Status:  Discontinued        1 g 200 mL/hr over 30 Minutes Intravenous  Once 05/11/20 1707 05/12/20 0755   05/12/20 1000  cefTRIAXone (ROCEPHIN) 1 g in sodium chloride 0.9 % 100 mL IVPB  Status:  Discontinued        1 g 200 mL/hr over 30 Minutes Intravenous Every 24 hours 05/12/20 0749 05/12/20 1723   05/11/20 1800  azithromycin (ZITHROMAX) 500 mg in sodium chloride 0.9 % 250 mL IVPB  Status:  Discontinued        500 mg 250 mL/hr over 60 Minutes Intravenous Every 24 hours 05/11/20 1713 05/11/20 1725   05/11/20 1800  cefTRIAXone (ROCEPHIN) 1 g in sodium chloride 0.9 % 100 mL IVPB  Status:  Discontinued        1 g 200 mL/hr over 30 Minutes Intravenous Every 24 hours 05/11/20 1706 05/11/20 1709   05/11/20 0930  cefTRIAXone (ROCEPHIN) 1 g in sodium chloride 0.9 % 100 mL IVPB        1 g 200 mL/hr over 30 Minutes Intravenous  Once 05/11/20 0923 05/11/20  1143   05/11/20 0930  azithromycin (ZITHROMAX) 500 mg in sodium chloride 0.9 % 250 mL IVPB        500 mg 250 mL/hr over 60 Minutes Intravenous  Once 05/11/20 2725 05/11/20 1310     Subjective: Patient was seen and examined at bedside.  No overnight events.  He reports feeling much better tolerating full liquid diet.  Heart rate has been under control denies any abdominal pain, nausea, vomiting.  Objective: Vitals:   05/14/20 0800 05/14/20 0838 05/14/20 0900 05/14/20 1025  BP:  (!) 121/97 (!) 116/88 (!) 125/99  Pulse:  76 74 69  Resp: 17 (!) 24 (!) 24 16  Temp: 98.3 F (36.8 C)     TempSrc: Axillary     SpO2: 98% 98% 94% 96%  Weight:      Height:        Intake/Output Summary (Last 24 hours) at 05/14/2020 1234 Last data filed at 05/14/2020 1140 Gross per 24 hour  Intake 1103.58 ml  Output 735 ml  Net 368.58 ml  Filed Weights   05/11/20 0745 05/13/20 0925  Weight: 73.1 kg 77.1 kg    Examination:  General exam: Appears calm and comfortable  Respiratory system: Clear to auscultation. Respiratory effort normal. Cardiovascular system: S1 & S2 heard, RRR. No JVD, murmurs, rubs, gallops or clicks. No pedal edema. Gastrointestinal system: Abdomen is nondistended, soft and nontender. No organomegaly or masses felt. Normal bowel sounds heard. Central nervous system: Alert and oriented. No focal neurological deficits. Extremities:  No edema, No leg swelling, No cyanosis. Skin: No rashes, lesions or ulcers Psychiatry: Judgement and insight appear normal. Mood & affect appropriate.     Data Reviewed: I have personally reviewed following labs and imaging studies  CBC: Recent Labs  Lab 05/11/20 0736 05/12/20 0400 05/12/20 1631 05/13/20 0205 05/14/20 0212  WBC 6.8 5.9 6.1 4.4 3.7*  NEUTROABS 6.4  --   --   --   --   HGB 9.5* 8.6* 8.9* 8.4* 8.8*  HCT 31.7* 29.8* 30.6* 29.5* 31.8*  MCV 79.1* 81.9 82.0 84.5 85.0  PLT 102* 99* 109* 108* 115*   Basic Metabolic Panel: Recent  Labs  Lab 05/11/20 0736 05/12/20 0400 05/12/20 1631 05/13/20 0205 05/14/20 0212  NA 138 137 137 134* 132*  K 3.9 3.3* 3.4* 3.7 3.6  CL 103 107 107 106 108  CO2 20* 20* 22 18* 19*  GLUCOSE 92 101* 157* 106* 94  BUN 31* 40* 40* 34* 30*  CREATININE 1.20 1.04 0.96 0.89 0.82  CALCIUM 8.7* 8.1* 7.9* 8.0* 8.1*  MG  --   --   --  1.9 1.9  PHOS  --   --   --  2.9 2.5   GFR: Estimated Creatinine Clearance: 88.8 mL/min (by C-G formula based on SCr of 0.82 mg/dL). Liver Function Tests: Recent Labs  Lab 05/11/20 0736 05/12/20 0400 05/12/20 1631 05/13/20 0205 05/14/20 0212  AST 43* 48* 46* 36 41  ALT 20 31 32 27 33  ALKPHOS 94 76 86 72 76  BILITOT 1.8* 1.0 1.0 0.7 1.0  PROT 6.7 5.7* 6.2* 5.6* 5.9*  ALBUMIN 3.3* 2.8* 3.0* 2.7* 2.8*   No results for input(s): LIPASE, AMYLASE in the last 168 hours. No results for input(s): AMMONIA in the last 168 hours. Coagulation Profile: Recent Labs  Lab 05/11/20 0736 05/12/20 1634  INR 2.1* 2.1*   Cardiac Enzymes: No results for input(s): CKTOTAL, CKMB, CKMBINDEX, TROPONINI in the last 168 hours. BNP (last 3 results) No results for input(s): PROBNP in the last 8760 hours. HbA1C: No results for input(s): HGBA1C in the last 72 hours. CBG: Recent Labs  Lab 05/12/20 1622  GLUCAP 148*   Lipid Profile: No results for input(s): CHOL, HDL, LDLCALC, TRIG, CHOLHDL, LDLDIRECT in the last 72 hours. Thyroid Function Tests: Recent Labs    05/13/20 0205  TSH 1.178   Anemia Panel: No results for input(s): VITAMINB12, FOLATE, FERRITIN, TIBC, IRON, RETICCTPCT in the last 72 hours. Sepsis Labs: Recent Labs  Lab 05/11/20 0736 05/11/20 1057 05/12/20 1634 05/12/20 1904  LATICACIDVEN 4.2* 2.4* 3.4* 2.8*    Recent Results (from the past 240 hour(s))  Blood Culture (routine x 2)     Status: None (Preliminary result)   Collection Time: 05/11/20  7:35 AM   Specimen: BLOOD RIGHT FOREARM  Result Value Ref Range Status   Specimen Description    Final    BLOOD RIGHT FOREARM Performed at [facility] [place], [address] [place], [place] [address]    Special Requests  Final    BOTTLES DRAWN AEROBIC AND ANAEROBIC Blood Culture adequate volume Performed at [facility], [address] [place], [place] [address]    Culture   Final    NO GROWTH 2 DAYS Performed at [facility], [address], [place], [place] [address]    Report Status PENDING  Incomplete  Blood Culture (routine x 2)     Status: None (Preliminary result)   Collection Time: 05/11/20  7:36 AM   Specimen: BLOOD LEFT ARM  Result Value Ref Range Status   Specimen Description   Final    BLOOD LEFT ARM Performed at [facility], [address] [place], [place] [address]    Special Requests   Final    BOTTLES DRAWN AEROBIC AND ANAEROBIC Blood Culture adequate volume Performed at [facility], [address] [place], [place] [address]    Culture   Final    NO GROWTH 2 DAYS Performed at [facility], [address] [place], [place] [address]    Report Status PENDING  Incomplete  Urine culture     Status: Abnormal   Collection Time: 05/11/20  7:36 AM   Specimen: In/Out Cath Urine  Result Value Ref Range Status   Specimen Description   Final    IN/OUT CATH URINE Performed at [facility], [address] [place], [place] [address]    Special Requests   Final    NONE Performed at [facility], [address] [place], [place] [address]    Culture (A)  Final    3,000 COLONIES/mL KLEBSIELLA PNEUMONIAE Confirmed Extended Spectrum Beta-Lactamase Producer (ESBL).  In bloodstream infections from ESBL organisms, carbapenems are preferred over piperacillin/tazobactam. They are shown to have a lower risk of mortality.    Report Status 05/13/2020 FINAL  Final   Organism ID, Bacteria KLEBSIELLA PNEUMONIAE (A)  Final      Susceptibility   Klebsiella pneumoniae - MIC*    AMPICILLIN >=32  RESISTANT Resistant     CEFAZOLIN >=64 RESISTANT Resistant     CEFTRIAXONE >=64 RESISTANT Resistant     CIPROFLOXACIN 2 INTERMEDIATE Intermediate     GENTAMICIN >=16 RESISTANT Resistant     IMIPENEM <=0.25 SENSITIVE Sensitive     NITROFURANTOIN 64 INTERMEDIATE Intermediate     TRIMETH/SULFA >=320 RESISTANT Resistant     AMPICILLIN/SULBACTAM >=32 RESISTANT Resistant     PIP/TAZO 16 SENSITIVE Sensitive     * 3,000 COLONIES/mL KLEBSIELLA PNEUMONIAE  SARS Coronavirus 2 by RT PCR (hospital order, performed in [facility] hospital lab) Nasopharyngeal Nasopharyngeal Swab     Status: None   Collection Time: 05/11/20  8:00 AM   Specimen: Nasopharyngeal Swab  Result Value Ref Range Status   SARS Coronavirus 2 NEGATIVE NEGATIVE Final    Comment: (NOTE) SARS-CoV-2 target nucleic acids are NOT DETECTED.  The SARS-CoV-2 RNA is generally detectable in upper and lower respiratory specimens during the acute phase of infection. The lowest concentration of SARS-CoV-2 viral copies this assay can detect is 250 copies / mL. A negative result does not preclude SARS-CoV-2 infection and should not be used as the sole basis for treatment or other patient management decisions.  A negative result may occur with improper specimen collection / handling, submission of specimen other than nasopharyngeal swab, presence of viral mutation(s) within the areas targeted by this assay, and inadequate number of viral copies (<250 copies / mL). A negative  result must be combined with clinical observations, patient history, and epidemiological information.  Fact Sheet for Patients:   BoilerBrush.com.cy  Fact Sheet for Healthcare Providers: https://pope.com/  This test is not yet approved or  cleared by the [place] FDA and has been authorized for detection and/or diagnosis of SARS-CoV-2 by FDA under an Emergency Use Authorization (EUA).  This EUA will remain in  effect (meaning this test can be used) for the duration of the COVID-19 declaration under Section 564(b)(1) of the Act, 21 U.S.C. section 360bbb-3(b)(1), unless the authorization is terminated or revoked sooner.  Performed at [facility], [address] [place], [place] [address]   MRSA PCR Screening     Status: None   Collection Time: 05/13/20  7:50 AM   Specimen: Nasal Mucosa; Nasopharyngeal  Result Value Ref Range Status   MRSA by PCR NEGATIVE NEGATIVE Final    Comment:        The GeneXpert MRSA Assay (FDA approved for NASAL specimens only), is one component of a comprehensive MRSA colonization surveillance program. It is not intended to diagnose MRSA infection nor to guide or monitor treatment for MRSA infections. Performed at [facility], [address] [place], [place] [address]      Radiology Studies: CT ABDOMEN PELVIS W CONTRAST  Result Date: 05/13/2020 CLINICAL DATA:  Abdominal pain EXAM: CT ABDOMEN AND PELVIS WITH CONTRAST TECHNIQUE: Multidetector CT imaging of the abdomen and pelvis was performed using the standard protocol following bolus administration of intravenous contrast. CONTRAST:  OMNIPAQUE IOHEXOL 300 MG/ML  SOLN COMPARISON:  None. FINDINGS: Lower chest: Lung bases demonstrate bilateral pleural effusions and septal thickening similar to that seen on recent CT of the chest. Hepatobiliary: Fatty infiltration of the liver is noted. Scattered hypodensities are noted throughout the liver consistent with small cysts. The gallbladder is partially distended. Mild Peri cholecystic fluid is noted. No definitive gallstones are seen. Pancreas: Unremarkable. No pancreatic ductal dilatation or surrounding inflammatory changes. Spleen: Normal in size without focal abnormality. Adrenals/Urinary Tract: Adrenal glands show some hypertrophy in the left adrenal. The kidneys demonstrate a normal enhancement pattern. Mild perinephric stranding is  noted worse on the left than the right. Delayed images show normal excretion of contrast material. The bladder is well distended. Stomach/Bowel: Mild fecal material is noted within the colon. No obstructive or inflammatory changes are seen. The appendix is not well visualized. No inflammatory changes to suggest appendicitis are seen. Small bowel and stomach appear unremarkable. Vascular/Lymphatic: Aortic atherosclerosis. No enlarged abdominal or pelvic lymph nodes. Retroaortic left renal vein is noted. Reproductive: Prostate is unremarkable. Other: No abdominal wall hernia or abnormality. No abdominopelvic ascites. Musculoskeletal: Degenerative changes of lumbar spine are noted. IMPRESSION: Bilateral pleural effusions and septal thickening similar to that seen on recent chest CT Fatty liver. Partial distension of the gallbladder with pericholecystic fluid. If patient has right upper quadrant localized pain ultrasound may be helpful for further evaluation. No other definitive acute abnormality is seen. Electronically Signed   By: Alcide Clever M.D.   On: 05/13/2020 00:05   DG Chest Port 1 View  Result Date: 05/13/2020 CLINICAL DATA:  Hypoxia EXAM: PORTABLE CHEST 1 VIEW COMPARISON:  Chest radiograph May 11, 2020 and chest CT May 12, 2020 FINDINGS: There is persistent pleural effusion on the right. Smaller pleural effusion present 1 day prior on the left not appreciable on current radiographic examination. There is atelectatic change in the right base with questionable superimposed pneumonia in the  right base. Lungs elsewhere appear clear. There is cardiomegaly with pulmonary vascularity normal. Patient is status post coronary artery bypass grafting. There is a left atrial appendage clamp. 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 No adenopathy appreciable. There is aortic atherosclerosis. No bone lesions. IMPRESSION: Postoperative changes with stable cardiac prominence. Small right  pleural effusion with suspected infiltrate and atelectasis right base. Small left pleural effusion noted previously no longer evident. Aortic Atherosclerosis (ICD10-I70.0). Electronically Signed   By: Bretta Bang III M.D.   On: 05/13/2020 06:29   ECHOCARDIOGRAM COMPLETE  Result Date: 05/12/2020    ECHOCARDIOGRAM REPORT   Patient Name:   [facility] Date of Exam: 05/12/2020 Medical Rec #:  [id number]  Height:       74.0 in Accession #:    [id number] Weight:       161.2 lb Date of Birth:  [birth year] BSA:          1.983 m Patient Age:    73 years   BP:           106/84 mmHg Patient Gender: M          HR:           73 bpm. Exam Location:  Inpatient Procedure: 2D Echo, Cardiac Doppler and Color Doppler Indications:    I48.0 Paroxysmal atrial fibrillation  History:        Patient has no prior history of Echocardiogram examinations.                 CHF, CAD, Prior CABG, TIA, Aortic Valve Disease; Risk                 Factors:Hypertension.                 Aortic Valve: unknown bioprosthetic tilting disk valve is                 present in the aortic position. Procedure Date: 2015.  Sonographer:    Elmarie Shiley Dance Referring Phys: [id number] Amiracle Neises IMPRESSIONS  1. Left ventricular ejection fraction, by estimation, is 55 to 60%. The left ventricle has normal function. The left ventricle has no regional wall motion abnormalities. There is moderate concentric left ventricular hypertrophy. Left ventricular diastolic parameters are indeterminate.  2. Right ventricular systolic function is normal. The right ventricular size is normal. There is moderately elevated pulmonary artery systolic pressure.  3. Left atrial size was severely dilated.  4. Right atrial size was severely dilated.  5. The mitral valve is normal in structure. Mild mitral valve regurgitation. No evidence of mitral stenosis.  6. There is thickening of the septal leaflet. The tricuspid valve is abnormal.  7. Perivalvular thickening around the bioprosthetic  aortic valve. The valve appears to be stable and there is no rocking motion. In the setting of bacteremia concern for endocarditis/abscess. TEE recommended.. The aortic valve has been repaired/replaced. Aortic valve regurgitation is not visualized. No aortic stenosis is present. There is a unknown bioprosthetic tilting disk valve present in the aortic position. Procedure Date: 2015. Echo findings are consistent with normal structure and function of the aortic valve prosthesis. Aortic valve area, by VTI measures 1.39 cm. Aortic valve mean gradient measures 10.0 mmHg. Aortic valve Vmax measures 2.16 m/s.  8. Aortic dilatation noted. 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  9. The inferior vena cava is dilated in size with >50% respiratory variability, suggesting right atrial pressure of 8 mmHg.  FINDINGS  Left Ventricle: Left ventricular ejection fraction, by estimation, is 55 to 60%. The left ventricle has normal function. The left ventricle has no regional wall motion abnormalities. The left ventricular internal cavity size was normal in size. There is  moderate concentric left ventricular hypertrophy. Left ventricular diastolic parameters are indeterminate. Right Ventricle: The right ventricular size is normal. No increase in right ventricular wall thickness. Right ventricular systolic function is normal. There is moderately elevated pulmonary artery systolic pressure. The tricuspid regurgitant velocity is 3.11 m/s, and with an assumed right atrial pressure of 8 mmHg, the estimated right ventricular systolic pressure is 46.7 mmHg. Left Atrium: Left atrial size was severely dilated. Right Atrium: Right atrial size was severely dilated. Pericardium: There is no evidence of pericardial effusion. Mitral Valve: The mitral valve is normal in structure. Normal mobility of the mitral valve leaflets. Severe mitral annular calcification. Mild mitral valve regurgitation. No evidence of mitral valve  stenosis. Tricuspid Valve: There is thickening of the septal leaflet. The tricuspid valve is abnormal. Tricuspid valve regurgitation is mild . No evidence of tricuspid stenosis. Aortic Valve: Perivalvular thickening around the bioprosthetic aortic valve. The valve appears to be stable and there is no rocking motion. In the setting of bacteremia concern for endocarditis/abscess. TEE recommended. The aortic valve has been repaired/replaced.. There is mild thickening of the aortic valve. Aortic valve regurgitation is not visualized. No aortic stenosis is present. There is mild thickening of the aortic valve. Aortic valve mean gradient measures 10.0 mmHg. Aortic valve peak gradient measures 18.7 mmHg. Aortic valve area, by VTI measures 1.39 cm. There is a unknown bioprosthetic tilting disk valve present in the aortic position. Procedure Date: 2015. Echo findings are consistent with normal structure and function of the aortic valve prosthesis. Pulmonic Valve: The pulmonic valve was normal in structure. Pulmonic valve regurgitation is not visualized. No evidence of pulmonic stenosis. Aorta: Aortic dilatation noted. 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 Venous: The inferior vena cava is dilated in size with greater than 50% respiratory variability, suggesting right atrial pressure of 8 mmHg. IAS/Shunts: No atrial level shunt detected by color flow Doppler.  LEFT VENTRICLE PLAX 2D LVIDd:         5.50 cm LVIDs:         3.90 cm LV PW:         1.50 cm LV IVS:        1.40 cm LVOT diam:     2.40 cm LV SV:         48 LV SV Index:   24 LVOT Area:     4.52 cm  RIGHT VENTRICLE            IVC RV Basal diam:  3.30 cm    IVC diam: 3.10 cm RV Mid diam:    3.30 cm RV S prime:     8.04 cm/s TAPSE (M-mode): 1.0 cm LEFT ATRIUM              Index        RIGHT ATRIUM           Index LA diam:        5.70 cm  2.87 cm/m   RA Area:     29.70 cm LA Vol (A2C):   192.0 ml 96.82 ml/m  RA Volume:   110.00 ml 55.47 ml/m  LA Vol (A4C):   219.0 ml 110.43 ml/m LA Biplane Vol: 213.0 ml 107.41 ml/m  AORTIC  VALVE AV Area (Vmax):    1.39 cm AV Area (Vmean):   1.39 cm AV Area (VTI):     1.39 cm AV Vmax:           216.00 cm/s AV Vmean:          147.500 cm/s AV VTI:            0.342 m AV Peak Grad:      18.7 mmHg AV Mean Grad:      10.0 mmHg LVOT Vmax:         66.15 cm/s LVOT Vmean:        45.300 cm/s LVOT VTI:          0.105 m LVOT/AV VTI ratio: 0.31  AORTA Ao Root diam: 4.20 cm Ao Asc diam:  3.80 cm MITRAL VALVE                TRICUSPID VALVE MV Area (PHT): 3.65 cm     TR Peak grad:   38.7 mmHg MV Decel Time: 208 msec     TR Vmax:        311.00 cm/s MV E velocity: 196.00 cm/s MV A velocity: 50.70 cm/s   SHUNTS MV E/A ratio:  3.87         Systemic VTI:  0.10 m                             Systemic Diam: 2.40 cm Chilton Si MD Electronically signed by Chilton Si MD Signature Date/Time: 05/12/2020/5:59:32 PM    Final    ECHO TEE  Result Date: 05/13/2020    TRANSESOPHOGEAL ECHO REPORT   Patient Name:   [facility] [place] Date of Exam: 05/13/2020 Medical Rec #:  [id number]  Height:       74.0 in Accession #:    [id number] Weight:       161.2 lb Date of Birth:  1946/06/05 BSA:          1.983 m Patient Age:    73 years   BP:           97/73 mmHg Patient Gender: M          HR:           74 bpm. Exam Location:  Inpatient Procedure: Transesophageal Echo, Cardiac Doppler and Color Doppler Indications:     Bacteremia  History:         Patient has prior history of Echocardiogram examinations, most                  recent 05/12/2020. CHF, Abnormal ECG, Aortic Valve Disease,                  Arrythmias:Atrial Fibrillation; Signs/Symptoms:Bacteremia.                  Aortic valve replacement.  Sonographer:     Sheralyn Boatman RDCS Referring Phys:  [id number] Deri Fuelling MCDANIEL Diagnosing Phys: Olga Millers MD PROCEDURE: After discussion of the risks and benefits of a TEE, an informed consent was obtained from the patient. The transesophogeal probe was passed  without difficulty through the esophogus of the patient. Imaged were obtained with the patient in a left lateral decubitus position. Sedation performed by different physician. The patient's vital signs; including heart rate, blood pressure, and oxygen saturation; remained stable throughout the procedure. The patient developed no complications during the procedure. IMPRESSIONS  1. 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  2. Left ventricular ejection fraction, by estimation, is 55 to 60%. The left ventricle has normal function. The left ventricle has no regional wall motion abnormalities. There is moderate left ventricular hypertrophy.  3. Right ventricular systolic function is normal. The right ventricular size is mildly enlarged.  4. Left atrial size was severely dilated. No left atrial/left atrial appendage thrombus was detected.  5. Right atrial size was severely dilated.  6. The mitral valve is abnormal. Moderate to severe mitral valve regurgitation.  7. Tricuspid valve regurgitation is moderate to severe.  8. The aortic valve has been repaired/replaced. Aortic valve regurgitation is not visualized. No aortic stenosis is present.  9. 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 FINDINGS  Left Ventricle: Left ventricular ejection fraction, by estimation, is 55 to 60%. The left ventricle has normal function. The left ventricle has no regional wall motion abnormalities. The left ventricular internal cavity size was normal in size. There is  moderate left ventricular hypertrophy. Right Ventricle: The right ventricular size is mildly enlarged.Right ventricular systolic function is normal. Left Atrium: LAA clipped. Left atrial size was severely  dilated. Spontaneous echo contrast was present. No left atrial/left atrial appendage thrombus was detected. Right Atrium: Right atrial size was severely dilated. Pericardium: There is no evidence of pericardial effusion. Mitral Valve: The mitral valve is abnormal. There is severe thickening of the mitral valve leaflet(s). Moderate to severe mitral valve regurgitation. MV peak gradient, 6.4 mmHg. The mean mitral valve gradient is 2.5 mmHg. Tricuspid Valve: The tricuspid valve is grossly normal. Tricuspid valve regurgitation is moderate to severe. Aortic Valve: The aortic valve has been repaired/replaced. Aortic valve regurgitation is not visualized. No aortic stenosis is present. Aortic valve mean gradient measures 8.0 mmHg. Aortic valve peak gradient measures 13.4 mmHg. There is a bioprosthetic valve present in the aortic position. Pulmonic Valve: The pulmonic valve was normal in structure. Pulmonic valve regurgitation is trivial. Aorta: The aortic root is normal in size and structure. There is moderate (Grade III) plaque involving the descending aorta. IAS/Shunts: No atrial level shunt detected by color flow Doppler. 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  AORTIC VALVE AV Vmax:      183.00 cm/s AV Vmean:     138.000 cm/s AV VTI:       0.235 m AV Peak Grad: 13.4 mmHg AV Mean Grad: 8.0 mmHg MITRAL VALVE MV Peak grad: 6.4 mmHg MV Mean grad: 2.5 mmHg MV Vmax:      1.27 m/s MV Vmean:     74.0 cm/s MR PISA:        2.26 cm MR PISA Radius: 0.60 cm Olga Millers MD Electronically signed by Olga Millers MD Signature Date/Time: 05/13/2020/2:29:55 PM    Final    US Abdomen Limited RUQ  Result Date: 05/13/2020 CLINICAL DATA:  Abdomen pain EXAM:  ULTRASOUND ABDOMEN LIMITED RIGHT UPPER QUADRANT COMPARISON:  CT 05/12/2020 FINDINGS: Gallbladder: Multiple shadowing stones. Slight increased wall thickness up to 3.5 mm. Negative sonographic Murphy. Common bile  duct: Diameter: 5.2 mm Liver: Echogenicity within normal limits. Multiple cysts, the largest measured cyst in the right lobe measures 2.1 x 1.4 x 2.5 cm. Portal vein is patent on color Doppler imaging with normal direction of blood flow towards the liver. Other: Small amount of perihepatic ascites.  Right pleural effusion. IMPRESSION: 1. Cholelithiasis with slight increased gallbladder wall thickness but negative sonographic Murphy. Correlation with nuclear medicine hepatobiliary imaging could be obtained if persistent concern for acute cholecystitis. 2. Trace perihepatic ascites and right pleural effusion. Electronically Signed   By: Jasmine Pang M.D.   On: 05/13/2020 16:47    Scheduled Meds: . Chlorhexidine Gluconate Cloth  6 each Topical Daily  . losartan  12.5 mg Oral Daily  . mouth rinse  15 mL Mouth Rinse BID   Continuous Infusions: . sodium chloride 10 mL/hr at 05/14/20 1140  . ceFEPime (MAXIPIME) IV Stopped (05/14/20 1100)  . vancomycin Stopped (05/14/20 0706)     LOS: 3 days    Time spent: 35 mins.    Cipriano Bunker, MD [facility]   If 7PM-7AM, please contact night-coverage

## 2020-05-14 NOTE — Anesthesia Postprocedure Evaluation (Signed)
Anesthesia Post Note  Patient: Carlos Welch  Procedure(s) Performed: TRANSESOPHAGEAL ECHOCARDIOGRAM (TEE) (N/A )     Patient location during evaluation: PACU Anesthesia Type: MAC Level of consciousness: awake and alert Pain management: pain level controlled Vital Signs Assessment: post-procedure vital signs reviewed and stable Respiratory status: spontaneous breathing, nonlabored ventilation, respiratory function stable and patient connected to nasal cannula oxygen Cardiovascular status: stable and blood pressure returned to baseline Postop Assessment: no apparent nausea or vomiting Anesthetic complications: no   No complications documented.  Last Vitals:  Vitals:   05/14/20 1025 05/14/20 1251  BP: (!) 125/99   Pulse: 69   Resp: 16   Temp:  36.4 C  SpO2: 96%     Last Pain:  Vitals:   05/14/20 1251  TempSrc: Oral  PainSc:                  Tiajuana Amass

## 2020-05-14 NOTE — TOC Initial Note (Signed)
Transition of Care [facility]) - Initial/Assessment Note    Patient Details  Name: Carlos Welch MRN: [id number] Date of Birth: Feb 20, 1946  Transition of Care [facility]) CM/SW Contact:    Golda Acre, RN Phone Number: 05/14/2020, 7:54 AM  Clinical Narrative:                 Admitted with septic shock on [id number]./ wbc 3.2/hgb 8.8/ iv merrem and vancomycin. P-will follow for progression and toc needs.  Expected Discharge Plan: Home/Self Care Barriers to Discharge: Continued Medical Work up   Patient Goals and CMS Choice Patient states their goals for this hospitalization and ongoing recovery are:: to go home CMS Medicare.gov Compare Post Acute Care list provided to:: Patient    Expected Discharge Plan and Services Expected Discharge Plan: Home/Self Care   Discharge Planning Services: CM Consult   Living arrangements for the past 2 months: Single Family Home                                      Prior Living Arrangements/Services Living arrangements for the past 2 months: Single Family Home Lives with:: Self Patient language and need for interpreter reviewed:: Yes Do you feel safe going back to the place where you live?: Yes      Need for Family Participation in Patient Care: Yes (Comment) Care giver support system in place?: Yes (comment)   Criminal Activity/Legal Involvement Pertinent to Current Situation/Hospitalization: No - Comment as needed  Activities of Daily Living Home Assistive Devices/Equipment: Eyeglasses, Dentures (specify type) ADL Screening (condition at time of admission) Patient's cognitive ability adequate to safely complete daily activities?: No Is the patient deaf or have difficulty hearing?: No Does the patient have difficulty seeing, even when wearing glasses/contacts?: No Does the patient have difficulty concentrating, remembering, or making decisions?: No Patient able to express need for assistance with ADLs?: No Does the patient have  difficulty dressing or bathing?: No Independently performs ADLs?: No Does the patient have difficulty walking or climbing stairs?: Yes Weakness of Legs: Right Weakness of Arms/Hands: Right  Permission Sought/Granted                  Emotional Assessment Appearance:: Appears stated age Attitude/Demeanor/Rapport: Engaged Affect (typically observed): Calm Orientation: : Oriented to Self, Oriented to Place, Oriented to  Time, Oriented to Situation Alcohol / Substance Use: Not Applicable Psych Involvement: No (comment)  Admission diagnosis:  Sepsis (HCC) [A41.9] Community acquired pneumonia, unspecified laterality [J18.9] Sepsis, due to unspecified organism, unspecified whether acute organ dysfunction present [facility]) [A41.9] Patient Active Problem List   Diagnosis Date Noted  . Pressure injury of skin 05/12/2020  . Generalized abdominal pain   . Septic shock (HCC) 05/11/2020  . Community acquired pneumonia 05/11/2020  . Acute CHF (congestive heart failure) (HCC) 05/11/2020  . Atrial fibrillation, chronic (HCC) 05/11/2020   PCP:  Patient, No Pcp Per Pharmacy:   [facility] [address] [place] - [address] [facility] [address] [place] [address] Phone: [phone] Fax: [phone]     Social Determinants of Health (SDOH) Interventions    Readmission Risk Interventions No flowsheet data found.

## 2020-05-15 LAB — CBC
HCT: 32.3 % — ABNORMAL LOW (ref 39.0–52.0)
Hemoglobin: 9.3 g/dL — ABNORMAL LOW (ref 13.0–17.0)
MCH: 23.1 pg — ABNORMAL LOW (ref 26.0–34.0)
MCHC: 28.8 g/dL — ABNORMAL LOW (ref 30.0–36.0)
MCV: 80.1 fL (ref 80.0–100.0)
Platelets: 140 10*3/uL — ABNORMAL LOW (ref 150–400)
RBC: 4.03 MIL/uL — ABNORMAL LOW (ref 4.22–5.81)
RDW: 19 % — ABNORMAL HIGH (ref 11.5–15.5)
WBC: 4.5 10*3/uL (ref 4.0–10.5)
nRBC: 0.4 % — ABNORMAL HIGH (ref 0.0–0.2)

## 2020-05-15 LAB — BASIC METABOLIC PANEL
Anion gap: 12 (ref 5–15)
BUN: 23 mg/dL (ref 8–23)
CO2: 19 mmol/L — ABNORMAL LOW (ref 22–32)
Calcium: 8.6 mg/dL — ABNORMAL LOW (ref 8.9–10.3)
Chloride: 106 mmol/L (ref 98–111)
Creatinine, Ser: 0.74 mg/dL (ref 0.61–1.24)
GFR calc Af Amer: >60 mL/min (ref >60)
GFR calc non Af Amer: >60 mL/min (ref >60)
Glucose, Bld: 91 mg/dL (ref 70–99)
Potassium: 3.5 mmol/L (ref 3.5–5.1)
Sodium: 137 mmol/L (ref 135–145)

## 2020-05-15 MED ORDER — SODIUM BICARBONATE 650 MG PO TABS
650.0000 mg | ORAL_TABLET | Freq: Two times a day (BID) | ORAL | Status: AC
  Administered 2020-05-15 – 2020-05-17 (×6): 650 mg via ORAL
  Filled 2020-05-15 (×6): qty 1

## 2020-05-15 NOTE — Progress Notes (Signed)
Progress Note  Patient Name: Carlos Welch Date of Encounter: 05/15/2020  [facility] Cardiologist: Thurmon Fair, MD   Subjective   States that he feels much better.  No chest pain or shortness of breath.  He is wondering if his gallstones are causing his issues.  Inpatient Medications    Scheduled Meds:  apixaban  5 mg Oral BID   Chlorhexidine Gluconate Cloth  6 each Topical Daily   losartan  12.5 mg Oral Daily   mouth rinse  15 mL Mouth Rinse BID   Continuous Infusions:  sodium chloride 10 mL/hr at 05/15/20 0800   ceFEPime (MAXIPIME) IV Stopped (05/15/20 0232)   vancomycin Stopped (05/15/20 0711)   PRN Meds: sodium chloride, acetaminophen **OR** acetaminophen, albuterol, diphenhydrAMINE, hydrALAZINE, morphine injection, [DISCONTINUED] ondansetron **OR** ondansetron (ZOFRAN) IV, polyethylene glycol, traZODone   Vital Signs    Vitals:   05/15/20 0200 05/15/20 0607 05/15/20 0700 05/15/20 0800  BP: (!) 153/106 (!) 155/112 (!) 134/89 (!) 136/84  Pulse: 73 (!) 27 76 76  Resp: 17 18 16 20   Temp:    97.8 F (36.6 C)  TempSrc:    Oral  SpO2: 95% 94% 94% 96%  Weight:      Height:        Intake/Output Summary (Last 24 hours) at 05/15/2020 0950 Last data filed at 05/15/2020 0800 Gross per 24 hour  Intake 874.69 ml  Output 1900 ml  Net -1025.31 ml   Last 3 Weights 05/13/2020 05/11/2020 04/14/2014  Weight (lbs) 170 lb 161 lb 3.2 oz 183 lb  Weight (kg) 77.111 kg 73.12 kg 83.008 kg      Telemetry    Atrial fibrillation heart rate in the 70s- Personally Reviewed  ECG    No new- Personally Reviewed  Physical Exam   GEN: No acute distress.  Ill-appearing Neck: No JVD Cardiac: Irregularly irregular, soft systolic murmurs, rubs, or gallops.  Respiratory: Clear to auscultation bilaterally. GI: Soft, nontender, non-distended  MS: No edema; No deformity. Neuro:  Nonfocal  Psych: Normal affect   Labs    High Sensitivity Troponin:   Recent Labs  Lab  05/11/20 0738 05/11/20 1057 05/12/20 1631  TROPONINIHS 98* 105* 57*      Chemistry Recent Labs  Lab 05/12/20 1631 05/12/20 1631 05/13/20 0205 05/14/20 0212 05/15/20 0219  NA 137   < > 134* 132* 137  K 3.4*   < > 3.7 3.6 3.5  CL 107   < > 106 108 106  CO2 22   < > 18* 19* 19*  GLUCOSE 157*   < > 106* 94 91  BUN 40*   < > 34* 30* 23  CREATININE 0.96   < > 0.89 0.82 0.74  CALCIUM 7.9*   < > 8.0* 8.1* 8.6*  PROT 6.2*  --  5.6* 5.9*  --   ALBUMIN 3.0*  --  2.7* 2.8*  --   AST 46*  --  36 41  --   ALT 32  --  27 33  --   ALKPHOS 86  --  72 76  --   BILITOT 1.0  --  0.7 1.0  --   GFRNONAA >60   < > >60 >60 >60  GFRAA >60   < > >60 >60 >60  ANIONGAP 8   < > 10 5 12    < > = values in this interval not displayed.     Hematology Recent Labs  Lab 05/13/20 0205 05/14/20 0212 05/15/20 0219  WBC  4.4 3.7* 4.5  RBC 3.49* 3.74* 4.03*  HGB 8.4* 8.8* 9.3*  HCT 29.5* 31.8* 32.3*  MCV 84.5 85.0 80.1  MCH 24.1* 23.5* 23.1*  MCHC 28.5* 27.7* 28.8*  RDW 19.3* 19.3* 19.0*  PLT 108* 115* 140*    BNP Recent Labs  Lab 05/11/20 0736  BNP 2,352.4*     DDimer No results for input(s): DDIMER in the last 168 hours.   Radiology    ECHO TEE  Result Date: 05/13/2020    TRANSESOPHOGEAL ECHO REPORT   Patient Name:   Carlos Welch Date of Exam: 05/13/2020 Medical Rec #:  [id number]  Height:       74.0 in Accession #:    [id number] Weight:       161.2 lb Date of Birth:  1945-11-09 BSA:          1.983 m Patient Age:    74 years   BP:           97/73 mmHg Patient Gender: M          HR:           74 bpm. Exam Location:  Inpatient Procedure: Transesophageal Echo, Cardiac Doppler and Color Doppler Indications:     Bacteremia  History:         Patient has prior history of Echocardiogram examinations, most                  recent 05/12/2020. CHF, Abnormal ECG, Aortic Valve Disease,                  Arrythmias:Atrial Fibrillation; Signs/Symptoms:Bacteremia.                  Aortic valve replacement.   Sonographer:     Sheralyn Boatmanina West RDCS Referring Phys:  [id number] Deri FuellingJILL D MCDANIEL Diagnosing Phys: Olga MillersBrian Crenshaw MD PROCEDURE: After discussion of the risks and benefits of a TEE, an informed consent was obtained from the patient. The transesophogeal probe was passed without difficulty through the esophogus of the patient. Imaged were obtained with the patient in a left lateral decubitus position. Sedation performed by different physician. The patient's vital signs; including heart rate, blood pressure, and oxygen saturation; remained stable throughout the procedure. The patient developed no complications during the procedure. IMPRESSIONS  1. 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  2. Left ventricular ejection fraction, by estimation, is 55 to 60%. The left ventricle has normal function. The left ventricle has no regional wall motion abnormalities. There is moderate left ventricular hypertrophy.  3. Right ventricular systolic function is normal. The right ventricular size is mildly enlarged.  4. Left atrial size was severely dilated. No left atrial/left atrial appendage thrombus was detected.  5. Right atrial size was severely dilated.  6. The mitral valve is abnormal. Moderate to severe mitral valve regurgitation.  7. Tricuspid valve regurgitation is moderate to severe.  8. The aortic valve has been repaired/replaced. Aortic valve regurgitation is not visualized. No aortic stenosis is present.  9. 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 FINDINGS  Left Ventricle: Left ventricular ejection fraction, by estimation, is 55 to 60%. The left ventricle has normal function. The left ventricle has no regional wall motion abnormalities. The left  ventricular internal  cavity size was normal in size. There is  moderate left ventricular hypertrophy. Right Ventricle: The right ventricular size is mildly enlarged.Right ventricular systolic function is normal. Left Atrium: LAA clipped. Left atrial size was severely dilated. Spontaneous echo contrast was present. No left atrial/left atrial appendage thrombus was detected. Right Atrium: Right atrial size was severely dilated. Pericardium: There is no evidence of pericardial effusion. Mitral Valve: The mitral valve is abnormal. There is severe thickening of the mitral valve leaflet(s). Moderate to severe mitral valve regurgitation. MV peak gradient, 6.4 mmHg. The mean mitral valve gradient is 2.5 mmHg. Tricuspid Valve: The tricuspid valve is grossly normal. Tricuspid valve regurgitation is moderate to severe. Aortic Valve: The aortic valve has been repaired/replaced. Aortic valve regurgitation is not visualized. No aortic stenosis is present. Aortic valve mean gradient measures 8.0 mmHg. Aortic valve peak gradient measures 13.4 mmHg. There is a bioprosthetic valve present in the aortic position. Pulmonic Valve: The pulmonic valve was normal in structure. Pulmonic valve regurgitation is trivial. Aorta: The aortic root is normal in size and structure. There is moderate (Grade III) plaque involving the descending aorta. IAS/Shunts: No atrial level shunt detected by color flow Doppler. 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  AORTIC VALVE AV Vmax:      183.00 cm/s AV Vmean:     138.000 cm/s AV VTI:       0.235 m AV Peak Grad: 13.4 mmHg AV Mean Grad: 8.0 mmHg MITRAL VALVE MV Peak grad: 6.4 mmHg MV Mean grad: 2.5 mmHg MV Vmax:      1.27 m/s MV Vmean:      74.0 cm/s MR PISA:        2.26 cm MR PISA Radius: 0.60 cm Olga Millers MD Electronically signed by Olga Millers MD Signature Date/Time: 05/13/2020/2:29:55 PM    Final    US Abdomen Limited RUQ  Result Date: 05/13/2020 CLINICAL DATA:  Abdomen pain EXAM: ULTRASOUND ABDOMEN LIMITED RIGHT UPPER QUADRANT COMPARISON:  CT 05/12/2020 FINDINGS: Gallbladder: Multiple shadowing stones. Slight increased wall thickness up to 3.5 mm. Negative sonographic Murphy. Common bile duct: Diameter: 5.2 mm Liver: Echogenicity within normal limits. Multiple cysts, the largest measured cyst in the right lobe measures 2.1 x 1.4 x 2.5 cm. Portal vein is patent on color Doppler imaging with normal direction of blood flow towards the liver. Other: Small amount of perihepatic ascites.  Right pleural effusion. IMPRESSION: 1. Cholelithiasis with slight increased gallbladder wall thickness but negative sonographic Murphy. Correlation with nuclear medicine hepatobiliary imaging could be obtained if persistent concern for acute cholecystitis. 2. Trace perihepatic ascites and right pleural effusion. Electronically Signed   By: Jasmine Pang M.D.   On: 05/13/2020 16:47    Cardiac Studies   TEE-no obvious vegetations.  Possible ruptured cord.  Moderate to severe mitral regurgitation  Patient Profile     74 y.o. male here with septic shock shortness of breath multiple comorbidities  Assessment & Plan    Sepsis -Pneumonia -IV antibiotics -No evidence of obvious vegetation on transesophageal echocardiogram.  Acute on chronic diastolic heart failure, mitral regurgitation -Much improved.  Seems euvolemic. -Mitral regurgitation, moderate to severe. -Agree with Dr. Vickii Chafe, given his prior aortic valve replacement, sternotomy, I think he would be too high risk for surgical  repair.  Not a candidate for MitraClip because of restricted leaflets.  Gallstones -Given his recent illness, would be at moderate to high risk from a  cardiac perspective for any surgery.  Right now however he does appear euvolemic from a heart failure perspective.     For questions or updates, please contact [facility] Please consult [email or web address] for contact info under        Signed, Donato Schultz, MD  05/15/2020, 9:50 AM

## 2020-05-15 NOTE — Progress Notes (Signed)
PROGRESS NOTE    Carlos Welch  IWP:[id number] DOB: May 18, 1946 DOA: 05/11/2020 PCP: Patient, No Pcp Per    Brief Narrative: Carlos Welch a 69 Male with PMH of CHF, atrial fibrillation on Eliquis, poor outpatient follow-ups, and left against medical advice multiple times with unknown remaining history,  no records on file who presents with several day history of shortness of breath, malaise, fever. The patient reports he originally lives in [place] where he receives his medical care, but has recently moved to [place] to live with his daughter. He reports prolonged hospitalization in [facility] for subacute bacterial endocarditis, MRSA bacteremia.  His daughter reports to the ED physician that patient had prosthetic valve replacement 6 years ago and then after that he never followed up with his doctors.  He has recurrent hospitalization for CHF exacerbation but then he ended up being signing against medical advice, Patient reports orthopnea, exertional dyspnea with bilateral leg swelling.  He self titrates his Lasix medication.  He reports tmax of 102.F at home earlier today.  He was admitted from med [facility] for sepsis secondary to community-acquired pneumonia and CHF exacerbation.  Cardiology consulted,  recommended continue antibiotics, given history of subacute bacterial endocarditis, MRSA bacteremia patient underwent TEE with normal LV function,  no evidence for vegetation or abscess.  CT chest shows bilateral loculated effusion with opacity concerning for malignancy, pulmonology consulted.  He had severe abdominal pain followed by transient shock requiring pressors, he was accepted in the ICU,  managed with Levophed.  Blood pressure has improved, transferred back to SDU.  General surgery recommended outpatient follow-up if there is no any acute cholecystitis.  Given severe MR,  severe TR patient is not a surgical candidate.  Assessment & Plan:   Principal Problem:    Septic shock (HCC) Active Problems:   Community acquired pneumonia   Acute CHF (congestive heart failure) (HCC)   Atrial fibrillation, chronic (HCC)   Generalized abdominal pain   Pressure injury of skin   Sepsis secondary to community-acquired pneumonia / UTI. Sepsis present on admission, temp 102.6, respiratory rate 24, lactic acid 4.2,.   Chest x-ray rounded lateral basilar opacity may reflect nodule or rounded atelectasis. Initiated on ceftriaxone and Zithromax for community-acquired pneumonia. blood cultures: No growth so far.,  urine culture grew 3K Klebsiella pneumonia. Antibiotics changed with Vancomycin and cefepime given history of MRSA and possible valvular abscess. CT chest shows bilateral loculated effusion with opacity concerning for malignancy. Pulmonology consulted, helps with critical care management. Continue current antibiotics. MRSA nares negative, will de-escalate antibiotics.   Acute shortness of breath could be secondary to CHF Exacerbation : Resolved  BNP 2354, chest x-ray shows cardiomegaly, Appears euvolumic. 2D echocardiogram completed results LVEF 55-60% Continue Lasix 40 mg IV daily. Cardiology consulted recommended TEE which ruled out vegetations, valvular abscess. Severe MR, severe TR, Not a candidate for MitraClip because of restricted leaflets.   Elevated troponin could be secondary to CHF exacerbation/demand ischemia. Cardiology following. Doesn't appear ACS. 2D echo- LVEF 55-60%  Thrombocytopenia . Improving  No previous labs to compare baseline platelet count. 101 - 99 - 115 - Improving  Chronic anemia : Improving  He denies any GI bleed, hemoptysis.  Will obtain a stool for occult blood. GI bleed would be less likely since hemoglobin remained stable.  A. fib with normal ventricular rate heart rate controlled, continue Coreg. Continue Eliquis for anticoagulation,   Cholelithiasis: General surgery consulted, no acute  cholecystitis. Recommended outpatient follow-up for elective  repair.   DVT prophylaxis: Apixaban Code Status: Full code Family Communication: No one at bedside, discussed with patient in detail. Disposition Plan:   Disposition: Patient is from home. DC: Home with home PT versus SNF. Patient is not medically clear, pending work-up, clinical improvement.  Consultants:   Cardiology.  Procedures:   Echo , CT chest.  Antimicrobials:  Anti-infectives (From admission, onward)   Start     Dose/Rate Route Frequency Ordered Stop   05/13/20 0600  vancomycin (VANCOCIN) IVPB 1000 mg/200 mL premix     Discontinue     1,000 mg 200 mL/hr over 60 Minutes Intravenous Every 12 hours 05/12/20 1749     05/12/20 1830  vancomycin (VANCOREADY) IVPB 1500 mg/300 mL        1,500 mg 150 mL/hr over 120 Minutes Intravenous  Once 05/12/20 1749 05/12/20 2055   05/12/20 1800  ceFEPIme (MAXIPIME) 2 g in sodium chloride 0.9 % 100 mL IVPB     Discontinue     2 g 200 mL/hr over 30 Minutes Intravenous Every 8 hours 05/12/20 1749     05/12/20 1200  azithromycin (ZITHROMAX) 500 mg in sodium chloride 0.9 % 250 mL IVPB  Status:  Discontinued        500 mg 250 mL/hr over 60 Minutes Intravenous  Once 05/11/20 1707 05/12/20 0755   05/12/20 1200  azithromycin (ZITHROMAX) 500 mg in sodium chloride 0.9 % 250 mL IVPB  Status:  Discontinued        500 mg 250 mL/hr over 60 Minutes Intravenous Every 24 hours 05/12/20 0749 05/12/20 1723   05/12/20 1100  cefTRIAXone (ROCEPHIN) 1 g in sodium chloride 0.9 % 100 mL IVPB  Status:  Discontinued        1 g 200 mL/hr over 30 Minutes Intravenous  Once 05/11/20 1707 05/12/20 0755   05/12/20 1000  cefTRIAXone (ROCEPHIN) 1 g in sodium chloride 0.9 % 100 mL IVPB  Status:  Discontinued        1 g 200 mL/hr over 30 Minutes Intravenous Every 24 hours 05/12/20 0749 05/12/20 1723   05/11/20 1800  azithromycin (ZITHROMAX) 500 mg in sodium chloride 0.9 % 250 mL IVPB  Status:  Discontinued         500 mg 250 mL/hr over 60 Minutes Intravenous Every 24 hours 05/11/20 1713 05/11/20 1725   05/11/20 1800  cefTRIAXone (ROCEPHIN) 1 g in sodium chloride 0.9 % 100 mL IVPB  Status:  Discontinued        1 g 200 mL/hr over 30 Minutes Intravenous Every 24 hours 05/11/20 1706 05/11/20 1709   05/11/20 0930  cefTRIAXone (ROCEPHIN) 1 g in sodium chloride 0.9 % 100 mL IVPB        1 g 200 mL/hr over 30 Minutes Intravenous  Once 05/11/20 0923 05/11/20 1143   05/11/20 0930  azithromycin (ZITHROMAX) 500 mg in sodium chloride 0.9 % 250 mL IVPB        500 mg 250 mL/hr over 60 Minutes Intravenous  Once 05/11/20 [id number] 05/11/20 1310     Subjective: Patient was seen and examined at bedside.  No overnight events.  He reports feeling much better,  Tolerating soft diet.  Heart rate has been under control, denies any abdominal pain, nausea, vomiting.  He reports generalized itching.  Objective: Vitals:   05/15/20 0800 05/15/20 0900 05/15/20 1000 05/15/20 1100  BP: (!) 136/84 (!) 163/101 (!) 138/94 (!) 142/100  Pulse: 76 81 73 71  Resp: 20 22 20  15  Temp: 97.8 F (36.6 C)     TempSrc: Oral     SpO2: 96% 96% 96% 96%  Weight:      Height:        Intake/Output Summary (Last 24 hours) at 05/15/2020 1209 Last data filed at 05/15/2020 0800 Gross per 24 hour  Intake 672.34 ml  Output 1950 ml  Net -1277.66 ml   Filed Weights   05/11/20 0745 05/13/20 0925  Weight: 73.1 kg 77.1 kg    Examination:  General exam: Appears calm and comfortable  Respiratory system: Clear to auscultation. Respiratory effort normal. Cardiovascular system: S1 & S2 heard, RRR. No JVD, murmurs, rubs, gallops or clicks. No pedal edema. Gastrointestinal system: Abdomen is nondistended, soft and nontender. No organomegaly or masses felt. Normal bowel sounds heard. Central nervous system: Alert and oriented. No focal neurological deficits. Extremities:  No edema, No leg swelling, No cyanosis. Skin: No rashes, lesions or  ulcers Psychiatry: Judgement and insight appear normal. Mood & affect appropriate.     Data Reviewed: I have personally reviewed following labs and imaging studies  CBC: Recent Labs  Lab 05/11/20 0736 05/11/20 0736 05/12/20 0400 05/12/20 1631 05/13/20 0205 05/14/20 0212 05/15/20 0219  WBC 6.8   < > 5.9 6.1 4.4 3.7* 4.5  NEUTROABS 6.4  --   --   --   --   --   --   HGB 9.5*   < > 8.6* 8.9* 8.4* 8.8* 9.3*  HCT 31.7*   < > 29.8* 30.6* 29.5* 31.8* 32.3*  MCV 79.1*   < > 81.9 82.0 84.5 85.0 80.1  PLT 102*   < > 99* 109* 108* 115* 140*   < > = values in this interval not displayed.   Basic Metabolic Panel: Recent Labs  Lab 05/12/20 0400 05/12/20 1631 05/13/20 0205 05/14/20 0212 05/15/20 0219  NA 137 137 134* 132* 137  K 3.3* 3.4* 3.7 3.6 3.5  CL 107 107 106 108 106  CO2 20* 22 18* 19* 19*  GLUCOSE 101* 157* 106* 94 91  BUN 40* 40* 34* 30* 23  CREATININE 1.04 0.96 0.89 0.82 0.74  CALCIUM 8.1* 7.9* 8.0* 8.1* 8.6*  MG  --   --  1.9 1.9  --   PHOS  --   --  2.9 2.5  --    GFR: Estimated Creatinine Clearance: 91 mL/min (by C-G formula based on SCr of 0.74 mg/dL). Liver Function Tests: Recent Labs  Lab 05/11/20 0736 05/12/20 0400 05/12/20 1631 05/13/20 0205 05/14/20 0212  AST 43* 48* 46* 36 41  ALT 20 31 32 27 33  ALKPHOS 94 76 86 72 76  BILITOT 1.8* 1.0 1.0 0.7 1.0  PROT 6.7 5.7* 6.2* 5.6* 5.9*  ALBUMIN 3.3* 2.8* 3.0* 2.7* 2.8*   No results for input(s): LIPASE, AMYLASE in the last 168 hours. No results for input(s): AMMONIA in the last 168 hours. Coagulation Profile: Recent Labs  Lab 05/11/20 0736 05/12/20 1634  INR 2.1* 2.1*   Cardiac Enzymes: No results for input(s): CKTOTAL, CKMB, CKMBINDEX, TROPONINI in the last 168 hours. BNP (last 3 results) No results for input(s): PROBNP in the last 8760 hours. HbA1C: No results for input(s): HGBA1C in the last 72 hours. CBG: Recent Labs  Lab 05/12/20 1622  GLUCAP 148*   Lipid Profile: No results for  input(s): CHOL, HDL, LDLCALC, TRIG, CHOLHDL, LDLDIRECT in the last 72 hours. Thyroid Function Tests: Recent Labs    05/13/20 0205  TSH 1.178   Anemia  Panel: No results for input(s): VITAMINB12, FOLATE, FERRITIN, TIBC, IRON, RETICCTPCT in the last 72 hours. Sepsis Labs: Recent Labs  Lab 05/11/20 0736 05/11/20 1057 05/12/20 1634 05/12/20 1904  LATICACIDVEN 4.2* 2.4* 3.4* 2.8*    Recent Results (from the past 240 hour(s))  Blood Culture (routine x 2)     Status: None (Preliminary result)   Collection Time: 05/11/20  7:35 AM   Specimen: BLOOD RIGHT FOREARM  Result Value Ref Range Status   Specimen Description   Final    BLOOD RIGHT FOREARM Performed at [facility], [address] [place], [place] [address]    Special Requests   Final    BOTTLES DRAWN AEROBIC AND ANAEROBIC Blood Culture adequate volume Performed at [facility], [address], [place], [place] [address]    Culture   Final    NO GROWTH 4 DAYS Performed at [facility], [address] [place], [place] [address]    Report Status PENDING  Incomplete  Blood Culture (routine x 2)     Status: None (Preliminary result)   Collection Time: 05/11/20  7:36 AM   Specimen: BLOOD LEFT ARM  Result Value Ref Range Status   Specimen Description   Final    BLOOD LEFT ARM Performed at [facility], [address] [place], [place] [address]    Special Requests   Final    BOTTLES DRAWN AEROBIC AND ANAEROBIC Blood Culture adequate volume Performed at [facility], [address] [place], [place] [address]    Culture   Final    NO GROWTH 4 DAYS Performed at [facility], [address], [place], [place] [address]    Report Status PENDING  Incomplete  Urine culture     Status: Abnormal   Collection Time: 05/11/20  7:36 AM   Specimen: In/Out Cath Urine  Result Value Ref Range Status   Specimen Description   Final    IN/OUT CATH URINE Performed at [facility], [address] [place], [place] [address]    Special Requests   Final    NONE Performed at [facility], [address] [place], [place] [address]    Culture (A)  Final    3,000 COLONIES/mL KLEBSIELLA PNEUMONIAE Confirmed Extended Spectrum Beta-Lactamase Producer (ESBL).  In bloodstream infections from ESBL organisms, carbapenems are preferred over piperacillin/tazobactam. They are shown to have a lower risk of mortality.    Report Status 05/13/2020 FINAL  Final   Organism ID, Bacteria KLEBSIELLA PNEUMONIAE (A)  Final      Susceptibility   Klebsiella pneumoniae - MIC*    AMPICILLIN >=32 RESISTANT Resistant     CEFAZOLIN >=64 RESISTANT Resistant     CEFTRIAXONE >=64 RESISTANT Resistant     CIPROFLOXACIN 2 INTERMEDIATE Intermediate     GENTAMICIN >=16 RESISTANT Resistant     IMIPENEM <=0.25 SENSITIVE Sensitive     NITROFURANTOIN 64 INTERMEDIATE Intermediate     TRIMETH/SULFA >=320 RESISTANT Resistant     AMPICILLIN/SULBACTAM >=32 RESISTANT Resistant     PIP/TAZO 16 SENSITIVE Sensitive     * 3,000 COLONIES/mL KLEBSIELLA PNEUMONIAE  SARS Coronavirus 2 by RT PCR (hospital order, performed in [facility] hospital lab) Nasopharyngeal Nasopharyngeal Swab     Status: None   Collection Time: 05/11/20  8:00 AM   Specimen: Nasopharyngeal Swab  Result Value Ref Range Status   SARS Coronavirus 2 NEGATIVE NEGATIVE  Final    Comment: (NOTE) SARS-CoV-2 target nucleic acids are NOT DETECTED.  The SARS-CoV-2 RNA is generally detectable in upper and lower respiratory specimens during the acute phase of infection. The lowest concentration of SARS-CoV-2 viral copies this assay can detect is 250 copies / mL. A negative result does not preclude SARS-CoV-2 infection and should not be used as the sole basis for treatment or other patient management decisions.  A negative result may occur with improper specimen collection / handling, submission of specimen other than  nasopharyngeal swab, presence of viral mutation(s) within the areas targeted by this assay, and inadequate number of viral copies (<250 copies / mL). A negative result must be combined with clinical observations, patient history, and epidemiological information.  Fact Sheet for Patients:   BoilerBrush.com.cy  Fact Sheet for Healthcare Providers: https://pope.com/  This test is not yet approved or  cleared by the [place] FDA and has been authorized for detection and/or diagnosis of SARS-CoV-2 by FDA under an Emergency Use Authorization (EUA).  This EUA will remain in effect (meaning this test can be used) for the duration of the COVID-19 declaration under Section 564(b)(1) of the Act, 21 U.S.C. section 360bbb-3(b)(1), unless the authorization is terminated or revoked sooner.  Performed at [facility], [address] [place], [place] [address]   MRSA PCR Screening     Status: None   Collection Time: 05/13/20  7:50 AM   Specimen: Nasal Mucosa; Nasopharyngeal  Result Value Ref Range Status   MRSA by PCR NEGATIVE NEGATIVE Final    Comment:        The GeneXpert MRSA Assay (FDA approved for NASAL specimens only), is one component of a comprehensive MRSA colonization surveillance program. It is not intended to diagnose MRSA infection nor to guide or monitor treatment for MRSA infections. Performed at [facility], [address] [place], [place] [address]      Radiology Studies: US Abdomen Limited RUQ  Result Date: 05/13/2020 CLINICAL DATA:  Abdomen pain EXAM: ULTRASOUND ABDOMEN LIMITED RIGHT UPPER QUADRANT COMPARISON:  CT 05/12/2020 FINDINGS: Gallbladder: Multiple shadowing stones. Slight increased wall thickness up to 3.5 mm. Negative sonographic Murphy. Common bile duct: Diameter: 5.2 mm Liver: Echogenicity within normal limits. Multiple cysts, the largest measured cyst in the right lobe  measures 2.1 x 1.4 x 2.5 cm. Portal vein is patent on color Doppler imaging with normal direction of blood flow towards the liver. Other: Small amount of perihepatic ascites.  Right pleural effusion. IMPRESSION: 1. Cholelithiasis with slight increased gallbladder wall thickness but negative sonographic Murphy. Correlation with nuclear medicine hepatobiliary imaging could be obtained if persistent concern for acute cholecystitis. 2. Trace perihepatic ascites and right pleural effusion. Electronically Signed   By: Jasmine Pang M.D.   On: 05/13/2020 16:47    Scheduled Meds: . apixaban  5 mg Oral BID  . Chlorhexidine Gluconate Cloth  6 each Topical Daily  . losartan  12.5 mg Oral Daily  . mouth rinse  15 mL Mouth Rinse BID   Continuous Infusions: . sodium chloride 10 mL/hr at 05/15/20 0800  . ceFEPime (MAXIPIME) IV 2 g (05/15/20 1103)  . vancomycin Stopped (05/15/20 0711)     LOS: 4 days    Time spent: 25 mins.    Cipriano Bunker, MD [facility]   If 7PM-7AM, please contact night-coverage

## 2020-05-16 ENCOUNTER — Encounter (HOSPITAL_COMMUNITY): Payer: Self-pay | Admitting: Family Medicine

## 2020-05-16 LAB — BASIC METABOLIC PANEL
Anion gap: 7 (ref 5–15)
BUN: 14 mg/dL (ref 8–23)
CO2: 23 mmol/L (ref 22–32)
Calcium: 8.3 mg/dL — ABNORMAL LOW (ref 8.9–10.3)
Chloride: 105 mmol/L (ref 98–111)
Creatinine, Ser: 0.81 mg/dL (ref 0.61–1.24)
GFR calc Af Amer: >60 mL/min (ref >60)
GFR calc non Af Amer: >60 mL/min (ref >60)
Glucose, Bld: 116 mg/dL — ABNORMAL HIGH (ref 70–99)
Potassium: 3.4 mmol/L — ABNORMAL LOW (ref 3.5–5.1)
Sodium: 135 mmol/L (ref 135–145)

## 2020-05-16 LAB — CULTURE, BLOOD (ROUTINE X 2)
Culture: NO GROWTH
Culture: NO GROWTH
Special Requests: ADEQUATE
Special Requests: ADEQUATE

## 2020-05-16 LAB — GLUCOSE, CAPILLARY: Glucose-Capillary: 100 mg/dL — ABNORMAL HIGH (ref 70–99)

## 2020-05-16 MED ORDER — POTASSIUM CHLORIDE 20 MEQ PO PACK
40.0000 meq | PACK | Freq: Once | ORAL | Status: AC
  Administered 2020-05-16: 40 meq via ORAL
  Filled 2020-05-16: qty 2

## 2020-05-16 MED ORDER — CARVEDILOL 6.25 MG PO TABS
6.2500 mg | ORAL_TABLET | Freq: Two times a day (BID) | ORAL | Status: DC
  Administered 2020-05-16 – 2020-05-19 (×7): 6.25 mg via ORAL
  Filled 2020-05-16 (×7): qty 1

## 2020-05-16 NOTE — Progress Notes (Signed)
Pharmacy Antibiotic Note  Carlos Welch is a 74 y.o. male presented to the ED on 05/11/2020 with c/o generalized pain and SOB.  She was started on ceftriaxone and azithromycin on admission for PNA.  Pharmacy is consulted on 7/28 to broaden abx to vancomycin and cefepime.  Today, 05/16/2020:  Afebrile  WBC WNL  SCr stable  MRSA PCR negative  Plan: - Continue cefepime 2gm IV q8h - Stop vancomycin - Possible transition to Augmentin tomorrow per MD ___________________________________  Height: 6\' 2"  (188 cm) Weight: 77.1 kg (170 lb) IBW/kg (Calculated) : 82.2  Temp (24hrs), Avg:97.9 F (36.6 C), Min:97.7 F (36.5 C), Max:98.2 F (36.8 C)  Recent Labs  Lab 05/11/20 0736 05/11/20 0736 05/11/20 1057 05/12/20 0400 05/12/20 0400 05/12/20 1631 05/12/20 1634 05/12/20 1904 05/13/20 0205 05/14/20 0212 05/15/20 0219 05/16/20 0307  WBC 6.8   < >  --  5.9  --  6.1  --   --  4.4 3.7* 4.5  --   CREATININE 1.20   < >  --  1.04   < > 0.96  --   --  0.89 0.82 0.74 0.81  LATICACIDVEN 4.2*  --  2.4*  --   --   --  3.4* 2.8*  --   --   --   --    < > = values in this interval not displayed.    Estimated Creatinine Clearance: 89.9 mL/min (by C-G formula based on SCr of 0.81 mg/dL).    No Known Allergies   Thank you for allowing pharmacy to be a part of this patient's care.  07/16/20 05/16/2020 12:27 PM

## 2020-05-16 NOTE — Progress Notes (Signed)
Progress Note  Patient Name: Carlos Welch Date of Encounter: 05/16/2020  [facility] Cardiologist: Thurmon Fair, MD   Subjective   In bed. Just urinated in bottle. Asked about gallbladder surgery. May be considered as outpatient according to surgery. No CP, no SOB. BP higher  Inpatient Medications    Scheduled Meds: . apixaban  5 mg Oral BID  . carvedilol  6.25 mg Oral BID WC  . Chlorhexidine Gluconate Cloth  6 each Topical Daily  . losartan  12.5 mg Oral Daily  . mouth rinse  15 mL Mouth Rinse BID  . potassium chloride  40 mEq Oral Once  . sodium bicarbonate  650 mg Oral BID   Continuous Infusions: . sodium chloride 10 mL/hr at 05/16/20 0600  . ceFEPime (MAXIPIME) IV Stopped (05/16/20 0244)  . vancomycin 1,000 mg (05/16/20 0617)   PRN Meds: sodium chloride, acetaminophen **OR** acetaminophen, albuterol, diphenhydrAMINE, hydrALAZINE, morphine injection, [DISCONTINUED] ondansetron **OR** ondansetron (ZOFRAN) IV, polyethylene glycol, traZODone   Vital Signs    Vitals:   05/16/20 0400 05/16/20 0500 05/16/20 0600 05/16/20 0700  BP: (!) 149/114 (!) 140/88 (!) 159/106   Pulse: 69 71 77   Resp: 17 12 19    Temp: 97.8 F (36.6 C)   98.1 F (36.7 C)  TempSrc: Oral   Oral  SpO2: 94% 94% 96%   Weight:      Height:        Intake/Output Summary (Last 24 hours) at 05/16/2020 0857 Last data filed at 05/16/2020 0600 Gross per 24 hour  Intake 1045.37 ml  Output 1675 ml  Net -629.63 ml   Last 3 Weights 05/13/2020 05/11/2020 04/14/2014  Weight (lbs) 170 lb 161 lb 3.2 oz 183 lb  Weight (kg) 77.111 kg 73.12 kg 83.008 kg      Telemetry    Atrial fibrillation heart rate in the 70-80s- Personally Reviewed  ECG    No new- Personally Reviewed  Physical Exam   GEN: Thin frail HEENT: normal  Neck: no JVD, carotid bruits, or masses Cardiac: irreg irreg; soft sm, no rubs, or gallops,no edema  Respiratory:  clear to auscultation bilaterally, normal work of breathing GI: soft,  nontender, nondistended, + BS MS: no deformity or atrophy  Skin: warm and dry, no rash Neuro:  Alert and Oriented x 3, Strength and sensation are intact Psych: euthymic mood, full affect   Labs    High Sensitivity Troponin:   Recent Labs  Lab 05/11/20 0738 05/11/20 1057 05/12/20 1631  TROPONINIHS 98* 105* 57*      Chemistry Recent Labs  Lab 05/12/20 1631 05/12/20 1631 05/13/20 0205 05/13/20 0205 05/14/20 0212 05/15/20 0219 05/16/20 0307  NA 137   < > 134*   < > 132* 137 135  K 3.4*   < > 3.7   < > 3.6 3.5 3.4*  CL 107   < > 106   < > 108 106 105  CO2 22   < > 18*   < > 19* 19* 23  GLUCOSE 157*   < > 106*   < > 94 91 116*  BUN 40*   < > 34*   < > 30* 23 14  CREATININE 0.96   < > 0.89   < > 0.82 0.74 0.81  CALCIUM 7.9*   < > 8.0*   < > 8.1* 8.6* 8.3*  PROT 6.2*  --  5.6*  --  5.9*  --   --   ALBUMIN 3.0*  --  2.7*  --  2.8*  --   --   AST 46*  --  36  --  41  --   --   ALT 32  --  27  --  33  --   --   ALKPHOS 86  --  72  --  76  --   --   BILITOT 1.0  --  0.7  --  1.0  --   --   GFRNONAA >60   < > >60   < > >60 >60 >60  GFRAA >60   < > >60   < > >60 >60 >60  ANIONGAP 8   < > 10   < > 5 12 7    < > = values in this interval not displayed.     Hematology Recent Labs  Lab 05/13/20 0205 05/14/20 0212 05/15/20 0219  WBC 4.4 3.7* 4.5  RBC 3.49* 3.74* 4.03*  HGB 8.4* 8.8* 9.3*  HCT 29.5* 31.8* 32.3*  MCV 84.5 85.0 80.1  MCH 24.1* 23.5* 23.1*  MCHC 28.5* 27.7* 28.8*  RDW 19.3* 19.3* 19.0*  PLT 108* 115* 140*    BNP Recent Labs  Lab 05/11/20 0736  BNP 2,352.4*     DDimer No results for input(s): DDIMER in the last 168 hours.   Radiology    No results found.  Cardiac Studies   TEE-no obvious vegetations.  Possible ruptured cord.  Moderate to severe mitral regurgitation  Patient Profile     74 y.o. male here with resolved septic shock shortness of breath multiple comorbidities  Assessment & Plan    Sepsis -Pneumonia -IV antibiotics -No  evidence of obvious vegetation on transesophageal echocardiogram. No further cardiac intervention  Acute on chronic diastolic heart failure, mitral regurgitation -Much improved.  Euvolemic. -Mitral regurgitation, moderate to severe. -Agree with Dr. 61, given his prior aortic valve replacement, sternotomy, I think he would be too high risk for surgical repair.  Not a candidate for MitraClip because of restricted leaflets. Continue with medical mgt.   Gallstones -Given his recent illness, would be at moderate to high risk from a cardiac perspective for any surgery.  Right now however he does appear euvolemic from a heart failure perspective. Does not have urgent need for surgery per review of their notes.   Essential hypertension --will go ahead and restart Coreg 6.25 BID --may need to increase losartan 12.5 as well.   AFIB --under good rate control. Restarting Bb. Watch for any bradycardia    For questions or updates, please contact [facility] Please consult [email or web address] for contact info under        Signed, Vickii Chafe, MD  05/16/2020, 8:57 AM

## 2020-05-16 NOTE — Progress Notes (Addendum)
PROGRESS NOTE    Carlos Welch  ZOX:[id number] DOB: Dec 26, 1945 DOA: 05/11/2020 PCP: Patient, No Pcp Per    Brief Narrative: Jonpaul Lumm a 52 Male with PMH of CHF, atrial fibrillation on Eliquis, poor outpatient follow-ups, and left against medical advice multiple times with unknown remaining history,  no records on file who presents with several day history of shortness of breath, malaise, fever. The patient reports he originally lives in [place] where he receives his medical care, but has recently moved to [place] to live with his daughter. He reports prolonged hospitalization in [facility] for subacute bacterial endocarditis, MRSA bacteremia.  His daughter reports to the ED physician that patient had prosthetic valve replacement 6 years ago and then after that he never followed up with his doctors.  He has recurrent hospitalization for CHF exacerbation but then he ended up being signing against medical advice, Patient reports orthopnea, exertional dyspnea with bilateral leg swelling.  He self titrates his Lasix medication.  He reports tmax of 102.F at home earlier today.  He was admitted from med [facility] for sepsis secondary to community-acquired pneumonia and CHF exacerbation.  Cardiology consulted,  recommended continue antibiotics, given history of subacute bacterial endocarditis, MRSA bacteremia patient underwent TEE with normal LV function,  no evidence for vegetation or abscess.  CT chest shows bilateral loculated effusion with opacity concerning for malignancy, pulmonology consulted recommended longer antibiotics course.Marland Kitchen  He had severe abdominal pain followed by transient shock requiring pressors, he was accepted in the ICU,  managed with Levophed.  Blood pressure has improved, transferred back to SDU.  General surgery recommended outpatient follow-up if there is no any acute cholecystitis.  Given severe MR,  severe TR patient is not a surgical  candidate.  Assessment & Plan:   Principal Problem:   Septic shock (HCC) Active Problems:   Community acquired pneumonia   Acute CHF (congestive heart failure) (HCC)   Atrial fibrillation, chronic (HCC)   Generalized abdominal pain   Pressure injury of skin   Sepsis secondary to community-acquired pneumonia / UTI. Sepsis present on admission, temp 102.6, respiratory rate 24, lactic acid 4.2,.   Chest x-ray rounded lateral basilar opacity may reflect nodule or rounded atelectasis. Initiated on ceftriaxone and Zithromax for community-acquired pneumonia. blood cultures: No growth so far.,  urine culture grew 3K Klebsiella pneumonia. Antibiotics changed with Vancomycin and cefepime given history of MRSA and possible valvular abscess. CT chest shows bilateral loculated effusion with opacity concerning for malignancy. Pulmonology consulted, helps with critical care management. Pulmonology recommended longer antibiotic course, effusion very small to drain. Continue current antibiotics. MRSA nares negative, will de-escalate antibiotics.   Acute shortness of breath could be secondary to CHF Exacerbation : Resolved  BNP 2354, chest x-ray shows cardiomegaly, Appears euvolumic. 2D echocardiogram completed results LVEF 55-60% Continue Lasix 40 mg IV daily. Cardiology consulted recommended TEE which ruled out vegetations, valvular abscess. Severe MR, severe TR, Not a candidate for MitraClip because of restricted leaflets.   Elevated troponin could be secondary to demand ischemia. Cardiology following. Doesn't appear ACS. 2D echo- LVEF 55-60%  Thrombocytopenia . Improving  No previous labs to compare baseline platelet count. 101 - 99 - 115 - 140  Improving  Chronic anemia : Improving  He denies any GI bleed, hemoptysis.  Will obtain a stool for occult blood. GI bleed would be less likely since hemoglobin remained stable.  A. fib with normal ventricular rate heart rate controlled,  continue Coreg. Continue Eliquis for  anticoagulation,   Cholelithiasis: General surgery consulted, no acute cholecystitis. Recommended outpatient follow-up for elective repair.   DVT prophylaxis: Apixaban Code Status: Full code Family Communication: No one at bedside, discussed with patient in detail. Disposition Plan:  Anticipated discharge home in 1 to 2 days.  Disposition: Patient is from home. DC: Home with home services Patient is not medically clear, pending work-up, clinical improvement.  Consultants:   Cardiology.  Procedures:   Echo , CT chest.  Antimicrobials:  Anti-infectives (From admission, onward)   Start     Dose/Rate Route Frequency Ordered Stop   05/13/20 0600  vancomycin (VANCOCIN) IVPB 1000 mg/200 mL premix     Discontinue     1,000 mg 200 mL/hr over 60 Minutes Intravenous Every 12 hours 05/12/20 1749     05/12/20 1830  vancomycin (VANCOREADY) IVPB 1500 mg/300 mL        1,500 mg 150 mL/hr over 120 Minutes Intravenous  Once 05/12/20 1749 05/12/20 2055   05/12/20 1800  ceFEPIme (MAXIPIME) 2 g in sodium chloride 0.9 % 100 mL IVPB     Discontinue     2 g 200 mL/hr over 30 Minutes Intravenous Every 8 hours 05/12/20 1749     05/12/20 1200  azithromycin (ZITHROMAX) 500 mg in sodium chloride 0.9 % 250 mL IVPB  Status:  Discontinued        500 mg 250 mL/hr over 60 Minutes Intravenous  Once 05/11/20 1707 05/12/20 0755   05/12/20 1200  azithromycin (ZITHROMAX) 500 mg in sodium chloride 0.9 % 250 mL IVPB  Status:  Discontinued        500 mg 250 mL/hr over 60 Minutes Intravenous Every 24 hours 05/12/20 0749 05/12/20 1723   05/12/20 1100  cefTRIAXone (ROCEPHIN) 1 g in sodium chloride 0.9 % 100 mL IVPB  Status:  Discontinued        1 g 200 mL/hr over 30 Minutes Intravenous  Once 05/11/20 1707 05/12/20 0755   05/12/20 1000  cefTRIAXone (ROCEPHIN) 1 g in sodium chloride 0.9 % 100 mL IVPB  Status:  Discontinued        1 g 200 mL/hr over 30 Minutes Intravenous Every  24 hours 05/12/20 0749 05/12/20 1723   05/11/20 1800  azithromycin (ZITHROMAX) 500 mg in sodium chloride 0.9 % 250 mL IVPB  Status:  Discontinued        500 mg 250 mL/hr over 60 Minutes Intravenous Every 24 hours 05/11/20 1713 05/11/20 1725   05/11/20 1800  cefTRIAXone (ROCEPHIN) 1 g in sodium chloride 0.9 % 100 mL IVPB  Status:  Discontinued        1 g 200 mL/hr over 30 Minutes Intravenous Every 24 hours 05/11/20 1706 05/11/20 1709   05/11/20 0930  cefTRIAXone (ROCEPHIN) 1 g in sodium chloride 0.9 % 100 mL IVPB        1 g 200 mL/hr over 30 Minutes Intravenous  Once 05/11/20 0923 05/11/20 1143   05/11/20 0930  azithromycin (ZITHROMAX) 500 mg in sodium chloride 0.9 % 250 mL IVPB        500 mg 250 mL/hr over 60 Minutes Intravenous  Once 05/11/20 [id number] 05/11/20 1310     Subjective: Patient was seen and examined at bedside.  No overnight events.  He reports feeling much better,  Tolerating soft diet.  Heart rate has been under control, denies any abdominal pain, nausea, vomiting.  He reports generalized itching.  Objective: Vitals:   05/16/20 0800 05/16/20 0900 05/16/20 1000 05/16/20 1100  BP: Marland Kitchen(!)  163/106 (!) 169/122 (!) 145/96 (!) 133/85  Pulse: 75 (!) 110 79 79  Resp: 17 18 18 18   Temp:      TempSrc:      SpO2: 94% 97% 95% 97%  Weight:      Height:        Intake/Output Summary (Last 24 hours) at 05/16/2020 1151 Last data filed at 05/16/2020 1028 Gross per 24 hour  Intake 1473.02 ml  Output 1675 ml  Net -201.98 ml   Filed Weights   05/11/20 0745 05/13/20 0925  Weight: 73.1 kg 77.1 kg    Examination:  General exam: Appears calm and comfortable  Respiratory system: Clear to auscultation. Respiratory effort normal. Cardiovascular system: S1 & S2 heard, RRR. No JVD, murmurs, rubs, gallops or clicks. No pedal edema. Gastrointestinal system: Abdomen is nondistended, soft and nontender. No organomegaly or masses felt. Normal bowel sounds heard. Central nervous system: Alert and  oriented. No focal neurological deficits. Extremities:  No edema, No leg swelling, No cyanosis. Skin: No rashes, lesions or ulcers Psychiatry: Judgement and insight appear normal. Mood & affect appropriate.     Data Reviewed: I have personally reviewed following labs and imaging studies  CBC: Recent Labs  Lab 05/11/20 0736 05/11/20 0736 05/12/20 0400 05/12/20 1631 05/13/20 0205 05/14/20 0212 05/15/20 0219  WBC 6.8   < > 5.9 6.1 4.4 3.7* 4.5  NEUTROABS 6.4  --   --   --   --   --   --   HGB 9.5*   < > 8.6* 8.9* 8.4* 8.8* 9.3*  HCT 31.7*   < > 29.8* 30.6* 29.5* 31.8* 32.3*  MCV 79.1*   < > 81.9 82.0 84.5 85.0 80.1  PLT 102*   < > 99* 109* 108* 115* 140*   < > = values in this interval not displayed.   Basic Metabolic Panel: Recent Labs  Lab 05/12/20 1631 05/13/20 0205 05/14/20 0212 05/15/20 0219 05/16/20 0307  NA 137 134* 132* 137 135  K 3.4* 3.7 3.6 3.5 3.4*  CL 107 106 108 106 105  CO2 22 18* 19* 19* 23  GLUCOSE 157* 106* 94 91 116*  BUN 40* 34* 30* 23 14  CREATININE 0.96 0.89 0.82 0.74 0.81  CALCIUM 7.9* 8.0* 8.1* 8.6* 8.3*  MG  --  1.9 1.9  --   --   PHOS  --  2.9 2.5  --   --    GFR: Estimated Creatinine Clearance: 89.9 mL/min (by C-G formula based on SCr of 0.81 mg/dL). Liver Function Tests: Recent Labs  Lab 05/11/20 0736 05/12/20 0400 05/12/20 1631 05/13/20 0205 05/14/20 0212  AST 43* 48* 46* 36 41  ALT 20 31 32 27 33  ALKPHOS 94 76 86 72 76  BILITOT 1.8* 1.0 1.0 0.7 1.0  PROT 6.7 5.7* 6.2* 5.6* 5.9*  ALBUMIN 3.3* 2.8* 3.0* 2.7* 2.8*   No results for input(s): LIPASE, AMYLASE in the last 168 hours. No results for input(s): AMMONIA in the last 168 hours. Coagulation Profile: Recent Labs  Lab 05/11/20 0736 05/12/20 1634  INR 2.1* 2.1*   Cardiac Enzymes: No results for input(s): CKTOTAL, CKMB, CKMBINDEX, TROPONINI in the last 168 hours. BNP (last 3 results) No results for input(s): PROBNP in the last 8760 hours. HbA1C: No results for  input(s): HGBA1C in the last 72 hours. CBG: Recent Labs  Lab 05/12/20 1622  GLUCAP 148*   Lipid Profile: No results for input(s): CHOL, HDL, LDLCALC, TRIG, CHOLHDL, LDLDIRECT in the last  72 hours. Thyroid Function Tests: No results for input(s): TSH, T4TOTAL, FREET4, T3FREE, THYROIDAB in the last 72 hours. Anemia Panel: No results for input(s): VITAMINB12, FOLATE, FERRITIN, TIBC, IRON, RETICCTPCT in the last 72 hours. Sepsis Labs: Recent Labs  Lab 05/11/20 0736 05/11/20 1057 05/12/20 1634 05/12/20 1904  LATICACIDVEN 4.2* 2.4* 3.4* 2.8*    Recent Results (from the past 240 hour(s))  Blood Culture (routine x 2)     Status: None   Collection Time: 05/11/20  7:35 AM   Specimen: BLOOD RIGHT FOREARM  Result Value Ref Range Status   Specimen Description   Final    BLOOD RIGHT FOREARM Performed at [facility], [address] [place], [place] [address]    Special Requests   Final    BOTTLES DRAWN AEROBIC AND ANAEROBIC Blood Culture adequate volume Performed at [facility], [address], [place], [place] [address]    Culture   Final    NO GROWTH 5 DAYS Performed at [facility], [address], [place], [place] [address]    Report Status 05/16/2020 FINAL  Final  Blood Culture (routine x 2)     Status: None   Collection Time: 05/11/20  7:36 AM   Specimen: BLOOD LEFT ARM  Result Value Ref Range Status   Specimen Description   Final    BLOOD LEFT ARM Performed at [facility], [address] [place], [place] [address]    Special Requests   Final    BOTTLES DRAWN AEROBIC AND ANAEROBIC Blood Culture adequate volume Performed at [facility], [address], [place], [place] [address]    Culture   Final    NO GROWTH 5 DAYS Performed at [facility], [address] [place], [place] [address]    Report Status 05/16/2020 FINAL  Final  Urine culture     Status: Abnormal   Collection Time: 05/11/20  7:36 AM    Specimen: In/Out Cath Urine  Result Value Ref Range Status   Specimen Description   Final    IN/OUT CATH URINE Performed at [facility], [address] [place], [place] [address]    Special Requests   Final    NONE Performed at [facility], [address] [place], [place] [address]    Culture (A)  Final    3,000 COLONIES/mL KLEBSIELLA PNEUMONIAE Confirmed Extended Spectrum Beta-Lactamase Producer (ESBL).  In bloodstream infections from ESBL organisms, carbapenems are preferred over piperacillin/tazobactam. They are shown to have a lower risk of mortality.    Report Status 05/13/2020 FINAL  Final   Organism ID, Bacteria KLEBSIELLA PNEUMONIAE (A)  Final      Susceptibility   Klebsiella pneumoniae - MIC*    AMPICILLIN >=32 RESISTANT Resistant     CEFAZOLIN >=64 RESISTANT Resistant     CEFTRIAXONE >=64 RESISTANT Resistant     CIPROFLOXACIN 2 INTERMEDIATE Intermediate     GENTAMICIN >=16 RESISTANT Resistant     IMIPENEM <=0.25 SENSITIVE Sensitive     NITROFURANTOIN 64 INTERMEDIATE Intermediate     TRIMETH/SULFA >=320 RESISTANT Resistant     AMPICILLIN/SULBACTAM >=32 RESISTANT Resistant     PIP/TAZO 16 SENSITIVE Sensitive     * 3,000 COLONIES/mL KLEBSIELLA PNEUMONIAE  SARS Coronavirus 2 by RT PCR (hospital order, performed in [facility] hospital lab) Nasopharyngeal Nasopharyngeal Swab     Status: None   Collection Time: 05/11/20  8:00 AM  Specimen: Nasopharyngeal Swab  Result Value Ref Range Status   SARS Coronavirus 2 NEGATIVE NEGATIVE Final    Comment: (NOTE) SARS-CoV-2 target nucleic acids are NOT DETECTED.  The SARS-CoV-2 RNA is generally detectable in upper and lower respiratory specimens during the acute phase of infection. The lowest concentration of SARS-CoV-2 viral copies this assay can detect is 250 copies / mL. A negative result does not preclude SARS-CoV-2 infection and should not be used as the sole basis for treatment or other patient  management decisions.  A negative result may occur with improper specimen collection / handling, submission of specimen other than nasopharyngeal swab, presence of viral mutation(s) within the areas targeted by this assay, and inadequate number of viral copies (<250 copies / mL). A negative result must be combined with clinical observations, patient history, and epidemiological information.  Fact Sheet for Patients:   BoilerBrush.com.cy  Fact Sheet for Healthcare Providers: https://pope.com/  This test is not yet approved or  cleared by the [place] FDA and has been authorized for detection and/or diagnosis of SARS-CoV-2 by FDA under an Emergency Use Authorization (EUA).  This EUA will remain in effect (meaning this test can be used) for the duration of the COVID-19 declaration under Section 564(b)(1) of the Act, 21 U.S.C. section 360bbb-3(b)(1), unless the authorization is terminated or revoked sooner.  Performed at [facility], [address] [place], [place] [address]   MRSA PCR Screening     Status: None   Collection Time: 05/13/20  7:50 AM   Specimen: Nasal Mucosa; Nasopharyngeal  Result Value Ref Range Status   MRSA by PCR NEGATIVE NEGATIVE Final    Comment:        The GeneXpert MRSA Assay (FDA approved for NASAL specimens only), is one component of a comprehensive MRSA colonization surveillance program. It is not intended to diagnose MRSA infection nor to guide or monitor treatment for MRSA infections. Performed at [facility], [address], [place], [place] [address]      Radiology Studies: No results found.  Scheduled Meds: . apixaban  5 mg Oral BID  . carvedilol  6.25 mg Oral BID WC  . Chlorhexidine Gluconate Cloth  6 each Topical Daily  . losartan  12.5 mg Oral Daily  . mouth rinse  15 mL Mouth Rinse BID  . sodium bicarbonate  650 mg Oral BID   Continuous  Infusions: . sodium chloride 10 mL/hr at 05/16/20 1028  . ceFEPime (MAXIPIME) IV Stopped (05/16/20 1022)  . vancomycin Stopped (05/16/20 0717)     LOS: 5 days    Time spent: 35 mins.   Cipriano Bunker, MD [facility]   If 7PM-7AM, please contact night-coverage

## 2020-05-17 ENCOUNTER — Encounter (HOSPITAL_COMMUNITY): Payer: Self-pay | Admitting: Family Medicine

## 2020-05-17 DIAGNOSIS — I10 Essential (primary) hypertension: Secondary | ICD-10-CM

## 2020-05-17 DIAGNOSIS — I5031 Acute diastolic (congestive) heart failure: Secondary | ICD-10-CM

## 2020-05-17 DIAGNOSIS — I340 Nonrheumatic mitral (valve) insufficiency: Secondary | ICD-10-CM

## 2020-05-17 LAB — COMPREHENSIVE METABOLIC PANEL
ALT: 25 U/L (ref 0–44)
AST: 21 U/L (ref 15–41)
Albumin: 2.8 g/dL — ABNORMAL LOW (ref 3.5–5.0)
Alkaline Phosphatase: 87 U/L (ref 38–126)
Anion gap: 5 (ref 5–15)
BUN: 14 mg/dL (ref 8–23)
CO2: 24 mmol/L (ref 22–32)
Calcium: 8.3 mg/dL — ABNORMAL LOW (ref 8.9–10.3)
Chloride: 107 mmol/L (ref 98–111)
Creatinine, Ser: 0.76 mg/dL (ref 0.61–1.24)
GFR calc Af Amer: >60 mL/min (ref >60)
GFR calc non Af Amer: >60 mL/min (ref >60)
Glucose, Bld: 95 mg/dL (ref 70–99)
Potassium: 3.7 mmol/L (ref 3.5–5.1)
Sodium: 136 mmol/L (ref 135–145)
Total Bilirubin: 1.5 mg/dL — ABNORMAL HIGH (ref 0.3–1.2)
Total Protein: 6.1 g/dL — ABNORMAL LOW (ref 6.5–8.1)

## 2020-05-17 LAB — APTT: aPTT: 40 seconds — ABNORMAL HIGH (ref 24–36)

## 2020-05-17 LAB — LIPASE, BLOOD: Lipase: 27 U/L (ref 11–51)

## 2020-05-17 MED ORDER — LOSARTAN POTASSIUM 25 MG PO TABS
12.5000 mg | ORAL_TABLET | Freq: Once | ORAL | Status: AC
  Filled 2020-05-17 (×3): qty 0.5

## 2020-05-17 MED ORDER — AMOXICILLIN-POT CLAVULANATE 875-125 MG PO TABS
1.0000 | ORAL_TABLET | Freq: Two times a day (BID) | ORAL | Status: DC
  Administered 2020-05-17 – 2020-05-19 (×5): 1 via ORAL
  Filled 2020-05-17 (×5): qty 1

## 2020-05-17 MED ORDER — HEPARIN (PORCINE) 25000 UT/250ML-% IV SOLN
1400.0000 [IU]/h | INTRAVENOUS | Status: DC
  Administered 2020-05-17 – 2020-05-18 (×2): 1400 [IU]/h via INTRAVENOUS
  Filled 2020-05-17 (×2): qty 250

## 2020-05-17 MED ORDER — HEPARIN BOLUS VIA INFUSION
2500.0000 [IU] | Freq: Once | INTRAVENOUS | Status: AC
  Administered 2020-05-17: 2500 [IU] via INTRAVENOUS
  Filled 2020-05-17: qty 2500

## 2020-05-17 MED ORDER — LOSARTAN POTASSIUM 25 MG PO TABS: 25.0000 mg | ORAL_TABLET | Freq: Every day | ORAL | Status: DC

## 2020-05-17 NOTE — Consult Note (Signed)
[facility] Surgery Consult Note  Carlos Welch 10/02/1946  [id number].    Requesting MD: Cipriano Bunker Chief Complaint: Progressive shortness of breath Reason for Consult:   HPI:  Patient is a 74 year old male who was admitted on 05/11/2020 with progressive shortness of breath.  He has a history of heart failure, atrial fibrillation on Eliquis, he had a recently prolonged hospitalization [facility] and then [facility] in [place].  He had a prosthetic valve replacement 6 years ago and has had recurrent hospitalizations for congestive heart failure.  He signed out AMA on more than one occasion apparently.   On this admission he reported orthopnea, exertional dyspnea with bilateral leg swelling.  He apparently titrates his own Lasix medication.  On admission his temperature was 102 and he was transferred to [facility] long hospital from [facility] with congestive heart failure.  CT scan on admission showed pulmonary edema and parenchymal scarring, loculated small pleural effusion the right greater than left with an ovoid opacity in the right lung base measuring 2.6 x 1.6 cm.  Also that is post CABG with extensive coronary artery disease and aortic valve replacement, s/p left atrial appendage clipping.  Exacerbation/community-acquired pneumonia.  Additionally, he had elevated troponins, thrombocytopenia, chronic anemia, at the time of admission.  During his initial admission he underwent a CT of the chest and abdomen/pelvis.  CT of the abdomen pelvis showed fatty infiltration of the liver, small cyst, gallbladder was partially distended there was some mild pericholecystic fluid but no definitive gallstones.  He has developed more abdominal discomfort and abdominal ultrasound on 05/13/2020 shows multiple shadowing gallstones, slightly increased wall thickening up to 3.5 mm.  Negative Murphy sign Common bile duct was 5.2 mm.  Murphy sign was negative at the time of the study.  He  is currently afebrile blood pressure slightly elevated the last BP was 158/110.  LFTs on 7/30 were normal, his last WBC was on 05/15/2020 this was normal at 4.5. Now the patient is complaining of additional pain and we are asked to see. He says he's had pain forever, and it's his gallbladder, pain RUQ and back.  I cannot really get any additional information, he just gets agitated with further questioning.  He is currently on apixaban, antibiotics have been transitioned from Maxipime/vancomycin, to Augmentin today.  Urine culture grew out Klebsiella pneumonia/3000 colonies; he is on isolation for this.  No growth from the blood cultures. Echocardiogram 05/13/2020: 1. 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 2. Left ventricular ejection fraction, by estimation, is 55 to 60%. The left ventricle has normal function. The left ventricle has no regional wall motion abnormalities. There is moderate left ventricular hypertrophy. 3. Right ventricular systolic function is normal. The right ventricular size is mildly enlarged. 4. Left atrial size was severely dilated. No left atrial/left atrial appendage thrombus was detected. 5. Right atrial size was severely dilated. 6. The mitral valve is abnormal. Moderate to severe mitral valve regurgitation. 7. Tricuspid valve regurgitation is moderate to severe. 8. The aortic valve has been repaired/replaced. Aortic valve regurgitation is not visualized. No aortic stenosis is present. 9. 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 ROS: Review of Systems  Constitutional: Positive for fever.  HENT: Negative.   Eyes:  Negative.   Respiratory: Positive for shortness of breath.   Cardiovascular:  Positive for leg swelling.  Gastrointestinal: Positive for abdominal pain (also complaining of back pain), nausea and vomiting.  Genitourinary: Negative.   Musculoskeletal: Positive for back pain.  Skin: Negative.   Neurological: Negative.   Endo/Heme/Allergies: Bruises/bleeds easily.  Psychiatric/Behavioral: The patient is nervous/anxious.        Pt was actually yelling at us to take out the gallbladder when we said there might be some issues.    Family History  Problem Relation Age of Onset  . CAD Neg Hx    Antibiotics Given (last 72 hours)    Date/Time Action Medication Dose Rate   05/14/20 1721 New Bag/Given   ceFEPIme (MAXIPIME) 2 g in sodium chloride 0.9 % 100 mL IVPB 2 g 200 mL/hr   05/14/20 1800 New Bag/Given   vancomycin (VANCOCIN) IVPB 1000 mg/200 mL premix 1,000 mg 200 mL/hr   05/15/20 0202 New Bag/Given   ceFEPIme (MAXIPIME) 2 g in sodium chloride 0.9 % 100 mL IVPB 2 g 200 mL/hr   05/15/20 [id number] New Bag/Given   vancomycin (VANCOCIN) IVPB 1000 mg/200 mL premix 1,000 mg 200 mL/hr   05/15/20 1103 New Bag/Given   ceFEPIme (MAXIPIME) 2 g in sodium chloride 0.9 % 100 mL IVPB 2 g 200 mL/hr   05/15/20 1708 New Bag/Given   ceFEPIme (MAXIPIME) 2 g in sodium chloride 0.9 % 100 mL IVPB 2 g 200 mL/hr   05/15/20 1819 New Bag/Given   vancomycin (VANCOCIN) IVPB 1000 mg/200 mL premix 1,000 mg 200 mL/hr   05/16/20 0134 New Bag/Given   ceFEPIme (MAXIPIME) 2 g in sodium chloride 0.9 % 100 mL IVPB 2 g 200 mL/hr   05/16/20 0617 New Bag/Given   vancomycin (VANCOCIN) IVPB 1000 mg/200 mL premix 1,000 mg 200 mL/hr   05/16/20 [id number] New Bag/Given   ceFEPIme (MAXIPIME) 2 g in sodium chloride 0.9 % 100 mL IVPB 2 g 200 mL/hr   05/16/20 1741 New Bag/Given   ceFEPIme (MAXIPIME) 2 g in sodium chloride 0.9 % 100 mL IVPB 2 g 200 mL/hr   05/17/20 0334 New Bag/Given   ceFEPIme (MAXIPIME) 2 g in sodium chloride 0.9 % 100 mL IVPB 2 g 200 mL/hr   05/17/20 1015 Given   amoxicillin-clavulanate (AUGMENTIN)  875-125 MG per tablet 1 tablet 1 tablet      Past Medical History:  Diagnosis Date  . A-fib (HCC)   . Aortic stenosis    a. s/p pericardial AVR 2015.  . Bacterial endocarditis   . CAD (coronary artery disease)    a. s/p CABG, AVR, [facility]  . Chronic combined systolic and diastolic CHF (congestive heart failure) (HCC)   . History of noncompliance with medical treatment   . Hypertension   . Malnutrition (HCC)   . MRSA bacteremia   . 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 TIA (transient ischemic attack)   . Unintentional weight loss     Past Surgical History:  Procedure Laterality Date  . AORTIC VALVE REPLACEMENT    . HERNIA REPAIR    . TEE WITHOUT CARDIOVERSION N/A 05/13/2020   Procedure: TRANSESOPHAGEAL ECHOCARDIOGRAM (TEE);  Surgeon: Lewayne Buntingrenshaw, Brian S, MD;  Location: [facility] ENDOSCOPY;  Service: Cardiovascular;  Laterality: N/A;    Social History:  reports that he has quit smoking. His smoking use included cigarettes. He does not have any smokeless tobacco history on file. He reports that he does not drink  alcohol and does not use drugs.  Allergies: No Known Allergies  Medications Prior to Admission  Medication Sig Dispense Refill  . apixaban (ELIQUIS) 5 MG TABS tablet Take 5 mg by mouth in the morning and at bedtime.    Marland Kitchen atorvastatin (LIPITOR) 80 MG tablet Take 80 mg by mouth daily.    . carvedilol (COREG) 6.25 MG tablet Take 6.25 mg by mouth 2 (two) times daily with a meal.    . furosemide (LASIX) 40 MG tablet Take 40 mg by mouth daily.     . Ginkgo Biloba 40 MG TABS Take 1 tablet by mouth daily.    Marland Kitchen HEMP OIL-VANILLYL BUTYL ETHER EX Apply 1 application topically daily.    . Multiple Vitamin (MULTIVITAMIN WITH MINERALS) TABS tablet Take 1 tablet by mouth daily.    . potassium chloride SA (KLOR-CON) 20 MEQ tablet Take 20 mEq by mouth daily.    . tamsulosin (FLOMAX) 0.4 MG CAPS capsule Take 0.4 mg by mouth daily.    . TURMERIC  PO Take 1 tablet by mouth daily.      Blood pressure (!) 158/110, pulse 69, temperature (!) 97.4 F (36.3 C), temperature source Axillary, resp. rate 17, height 6\' 2"  (1.88 m), weight 77.1 kg, SpO2 93 %. Physical Exam:  General: elderly, anxious, white male who is laying in bed in NAD. Complains or RUQ pain on our coming into the room and wants gallbladder removed ASAP. HEENT: head is normocephalic, atraumatic.  Sclera are noninjected.  Pupils are equal.  Ears and nose without any masses or lesions.  Mouth is pink and moist.  Mild JVD. Heart: regular, rate, and rhythm.  Normal s1,s2.Loud mitral murmurs,  No gallops, or rubs noted.  Palpable radial and pedal pulses bilaterally Lungs: CTAB, no wheezes, rhonchi, or rales noted.  Respiratory effort nonlabored, on O2. Abd: soft, complains of tenderness RUQ, ND, +BS, no masses, hernias, or organomegaly MS: all 4 extremities are symmetrical with no cyanosis, clubbing, or edema.Venous stasis disease both lower legs Skin: warm and dry with no masses, lesions, or rashes Neuro: Cranial nerves 2-12 grossly intact, sensation is normal throughout Psych: Anxious and yelling at when we discussed his symptoms, saying he wants his gallbladder out now.    Results for orders placed or performed during the hospital encounter of 05/11/20 (from the past 48 hour(s))  Basic metabolic panel     Status: Abnormal   Collection Time: 05/16/20  3:07 AM  Result Value Ref Range   Sodium 135 135 - 145 mmol/L   Potassium 3.4 (L) 3.5 - 5.1 mmol/L   Chloride 105 98 - 111 mmol/L   CO2 23 22 - 32 mmol/L   Glucose, Bld 116 (H) 70 - 99 mg/dL    Comment: Glucose reference range applies only to samples taken after fasting for at least 8 hours.   BUN 14 8 - 23 mg/dL   Creatinine, Ser 07/16/20 0.61 - 1.24 mg/dL   Calcium 8.3 (L) 8.9 - 10.3 mg/dL   GFR calc non Af Amer >60 >60 mL/min   GFR calc Af Amer >60 >60 mL/min   Anion gap 7 5 - 15    Comment: Performed at [facility], [address] [place], [place]  Glucose, capillary     Status: Abnormal   Collection Time: 05/16/20  4:08 PM  Result Value Ref Range   Glucose-Capillary 100 (H) 70 - 99 mg/dL    Comment: Glucose reference range applies only  to samples taken after fasting for at least 8 hours.   No results found. . sodium chloride 10 mL/hr at 05/17/20 0600     Assessment/Plan Community-acquired pneumonia CHF Chronic atrial fibrillation on anticoagulation-Eliquis(LD 05/17/2020) Hx CABG and AVR with bioprosthesis graft Moderate to severe MR; moderate to severe TR, with possible rupture chordae  - Not a candidate for surgical repair Hx hyperlipidemia Hx of noncompliance   Abdominal pain/cholelithiasis  FEN: Soft diet/IV fluids to [place] ID: Azithromycin Rocephin x1 dose; Maxipime 7/27-8/2; vancomycin 7/28-05/16/20; Augmentin 8/2 >> day 1 DVT:  Eliquis Follow up:  TBD  Plan:  We have ordered labs, and a HIDA scan.  I would hold his Eliquis and put him on IV heparin till we get those studies back.  If he isn't a candidate for MVR, he would not be a candidate for cholecystectomy.  If he does have cholecystitis, he could get a drain, but Eliquis would have to be held for that also.  We will follow with you.     Sherrie George [facility] Surgery 05/17/2020, 11:08 AM Please see Amion for pager number during day hours 7:00am-4:30pm

## 2020-05-17 NOTE — Progress Notes (Signed)
Progress Note  Patient Name: Carlos Welch Date of Encounter: 05/17/2020  [facility] Cardiologist: Thurmon Fair, MD   Subjective   Laying in bed.  Very sleepy.  Denies any chest pain or SOB.    Inpatient Medications    Scheduled Meds: . amoxicillin-clavulanate  1 tablet Oral Q12H  . apixaban  5 mg Oral BID  . carvedilol  6.25 mg Oral BID WC  . Chlorhexidine Gluconate Cloth  6 each Topical Daily  . losartan  12.5 mg Oral Daily  . mouth rinse  15 mL Mouth Rinse BID  . sodium bicarbonate  650 mg Oral BID   Continuous Infusions: . sodium chloride 10 mL/hr at 05/17/20 0600   PRN Meds: sodium chloride, acetaminophen **OR** acetaminophen, albuterol, diphenhydrAMINE, hydrALAZINE, morphine injection, [DISCONTINUED] ondansetron **OR** ondansetron (ZOFRAN) IV, polyethylene glycol, traZODone   Vital Signs    Vitals:   05/17/20 0500 05/17/20 0600 05/17/20 0700 05/17/20 0800  BP: (!) 143/116 (!) 140/74 (!) 154/102 (!) 158/110  Pulse: 78 68 69 69  Resp: 20 13 17 17   Temp:    (!) 97.4 F (36.3 C)  TempSrc:    Axillary  SpO2: 92% 95% 94% 93%  Weight:      Height:        Intake/Output Summary (Last 24 hours) at 05/17/2020 1115 Last data filed at 05/17/2020 0700 Gross per 24 hour  Intake 385.09 ml  Output 750 ml  Net -364.91 ml   Last 3 Weights 05/13/2020 05/11/2020 04/14/2014  Weight (lbs) 170 lb 161 lb 3.2 oz 183 lb  Weight (kg) 77.111 kg 73.12 kg 83.008 kg      Telemetry    Atrial fibrillation with CVR- Personally Reviewed  ECG    No new EKG to review- Personally Reviewed  Physical Exam   GEN: Well nourished, well developed in no acute distress HEENT: Normal NECK: No JVD; No carotid bruits LYMPHATICS: No lymphadenopathy CARDIAC:irregularly irregular, no murmurs, rubs, gallops RESPIRATORY:  Clear to auscultation without rales, wheezing or rhonchi  ABDOMEN: Soft, non-tender, non-distended MUSCULOSKELETAL:  No edema; No deformity  SKIN: Warm and dry NEUROLOGIC:   Alert and oriented x 3 PSYCHIATRIC:  Normal affect    Labs    High Sensitivity Troponin:   Recent Labs  Lab 05/11/20 0738 05/11/20 1057 05/12/20 1631  TROPONINIHS 98* 105* 57*      Chemistry Recent Labs  Lab 05/12/20 1631 05/12/20 1631 05/13/20 0205 05/13/20 0205 05/14/20 0212 05/15/20 0219 05/16/20 0307  NA 137   < > 134*   < > 132* 137 135  K 3.4*   < > 3.7   < > 3.6 3.5 3.4*  CL 107   < > 106   < > 108 106 105  CO2 22   < > 18*   < > 19* 19* 23  GLUCOSE 157*   < > 106*   < > 94 91 116*  BUN 40*   < > 34*   < > 30* 23 14  CREATININE 0.96   < > 0.89   < > 0.82 0.74 0.81  CALCIUM 7.9*   < > 8.0*   < > 8.1* 8.6* 8.3*  PROT 6.2*  --  5.6*  --  5.9*  --   --   ALBUMIN 3.0*  --  2.7*  --  2.8*  --   --   AST 46*  --  36  --  41  --   --   ALT 32  --  27  --  33  --   --   ALKPHOS 86  --  72  --  76  --   --   BILITOT 1.0  --  0.7  --  1.0  --   --   GFRNONAA >60   < > >60   < > >60 >60 >60  GFRAA >60   < > >60   < > >60 >60 >60  ANIONGAP 8   < > 10   < > 5 12 7    < > = values in this interval not displayed.     Hematology Recent Labs  Lab 05/13/20 0205 05/14/20 0212 05/15/20 0219  WBC 4.4 3.7* 4.5  RBC 3.49* 3.74* 4.03*  HGB 8.4* 8.8* 9.3*  HCT 29.5* 31.8* 32.3*  MCV 84.5 85.0 80.1  MCH 24.1* 23.5* 23.1*  MCHC 28.5* 27.7* 28.8*  RDW 19.3* 19.3* 19.0*  PLT 108* 115* 140*    BNP Recent Labs  Lab 05/11/20 0736  BNP 2,352.4*     DDimer No results for input(s): DDIMER in the last 168 hours.   Radiology    No results found.  Cardiac Studies   TEE-no obvious vegetations.  Possible ruptured cord.  Moderate to severe mitral regurgitation  Patient Profile     74 y.o. male here with resolved septic shock shortness of breath multiple comorbidities  Assessment & Plan    Sepsis -Pneumonia -continue IV antibiotics per TRH -No evidence of obvious vegetation on transesophageal echocardiogram. No further cardiac intervention  Acute on chronic  diastolic heart failure, mitral regurgitation -he does not appear volume overloaded on exam -Mitral regurgitation, moderate to severe by echo with possible ruptured chordae with normal LVF. -Agree with Dr. 61, given his prior aortic valve replacement, sternotomy, I think he would be too high risk for surgical repair.  Not a candidate for MitraClip because of restricted leaflets.  -Continue with medical mgt.  -currently not on diuretics  Gallstones -Given his recent illness, would be at moderate to high risk from a cardiac perspective for any surgery.  Right now however he does appear euvolemic from a heart failure perspective. Does not have urgent need for surgery per review of their notes.   Essential hypertension --restarted Coreg 6.25 BID yesterday and BP remains elevated --creatinine stable at 0.81 so will increase Losartan to 25mg  daily --continue to titrate ARB and BB as needed for BP control  AFIB --under good rate control. --continue Carvedilol for rate control --continue Eliquis 5mg  BID  I have spent a total of 35 minutes with patient reviewing 2D echo , telemetry, EKGs, labs and examining patient as well as establishing an assessment and plan that was discussed with the patient.  > 50% of time was spent in direct patient care.    For questions or updates, please contact [facility] Please consult [email or web address] for contact info under        Signed, Vickii Chafe, MD  05/17/2020, 11:15 AM

## 2020-05-17 NOTE — Progress Notes (Signed)
PROGRESS NOTE    Kyley Rooke  ZOX:[id number] DOB: [birth year]Wilferd Ritson7/27/2021 PCP: Patient, No Pcp Per    Brief Narrative: Sahand Gosch a 63 Male with PMH of CHF, atrial fibrillation on Eliquis, poor outpatient follow-ups, and left against medical advice multiple times with unknown remaining history,  no records on file who presents with several day history of shortness of breath, malaise, fever. The patient reports he originally lives in [place] where he receives his medical care, but has recently moved to [place] to live with his daughter. He reports prolonged hospitalization in [facility] for subacute bacterial endocarditis, MRSA bacteremia.  His daughter reports to the ED physician that patient had prosthetic valve replacement 6 years ago and then after that he never followed up with his doctors.  He has recurrent hospitalization for CHF exacerbation but then he ended up being signing against medical advice, Patient reports orthopnea, exertional dyspnea with bilateral leg swelling.  He self titrates his Lasix medication.  He reports tmax of 102.F at home earlier today.  He was admitted from med [facility] for sepsis secondary to community-acquired pneumonia and CHF exacerbation.  Cardiology consulted,  recommended continue antibiotics, given history of subacute bacterial endocarditis, MRSA bacteremia patient underwent TEE with normal LV function,  no evidence for vegetation or abscess.  CT chest shows bilateral loculated effusion with opacity concerning for malignancy, pulmonology consulted recommended longer antibiotics course.Marland Kitchen  He had severe abdominal pain followed by transient shock requiring pressors, he was accepted in the ICU,  managed with Levophed.  Blood pressure has improved, transferred back to SDU.  General surgery consulted for right upper quadrant abdominal pain,  HIDA scan is ordered.  Assessment & Plan:   Principal Problem:   Septic shock  (HCC) Active Problems:   Community acquired pneumonia   Acute CHF (congestive heart failure) (HCC)   Atrial fibrillation, chronic (HCC)   Generalized abdominal pain   Pressure injury of skin   Sepsis secondary to community-acquired pneumonia / UTI. Sepsis present on admission, temp 102.6, respiratory rate 24, lactic acid 4.2,.   Chest x-ray rounded lateral basilar opacity may reflect nodule or rounded atelectasis. Initiated on ceftriaxone and Zithromax for community-acquired pneumonia. Blood cultures: No growth so far.,  urine culture grew 3K Klebsiella pneumonie Antibiotics changed with Vancomycin and cefepime given history of MRSA and possible valvular abscess. CT chest shows bilateral loculated effusion with opacity concerning for malignancy. Pulmonology consulted, helps with critical care management. Pulmonology recommended longer antibiotic course, effusion very small to drain. Continue current antibiotics. MRSA nares negative, will de-escalate antibiotics. IV antibiotics switched to Augmentin for 8 more days to complete 14-day course.   Acute shortness of breath could be secondary to CHF Exacerbation : Resolved  POA: BNP 2354, chest x-ray shows cardiomegaly, Appears euvolumic. 2D echocardiogram completed results LVEF 55-60% Continue Lasix 40 mg IV daily. Cardiology consulted, recommended TEE which ruled out vegetations, valvular abscess. Severe MR, severe TR, Not a candidate for MitraClip because of restricted leaflets.   Elevated troponin could be secondary to demand ischemia. Cardiology following. Doesn't appear ACS. 2D echo- LVEF 55-60%  Thrombocytopenia . Improving  No previous labs to compare baseline platelet count. 101 - 99 - 115 - 140  Improving.  Chronic anemia : Improving  He denies any GI bleed, hemoptysis.  GI bleed would be less likely since hemoglobin remained stable.  A. fib with normal ventricular rate heart rate controlled, continue Coreg. Continue  Eliquis for anticoagulation,   HTN:  Continue Coreg, losartan increased,  blood pressure better controlled.  Cholelithiasis: General surgery consulted, no acute cholecystitis. Recommended outpatient follow-up for elective repair. Patient continues to have right upper quadrant pain,  HIDA scan ordered Follow Up General surgery.   DVT prophylaxis: Apixaban Code Status: Full code Family Communication: No one at bedside, discussed with patient in detail. Disposition Plan:  Anticipated discharge home in 1 to 2 days.  Disposition: Patient is from home. DC: Home with home services. Patient is not medically clear, pending work-up, clinical improvement.  Consultants:   Cardiology, pulmonology, general surgery.  Procedures:   Echo , CT chest.  Antimicrobials:  Anti-infectives (From admission, onward)   Start     Dose/Rate Route Frequency Ordered Stop   05/17/20 1000  amoxicillin-clavulanate (AUGMENTIN) 875-125 MG per tablet 1 tablet     Discontinue     1 tablet Oral Every 12 hours 05/17/20 0940 05/25/20 0959   05/13/20 0600  vancomycin (VANCOCIN) IVPB 1000 mg/200 mL premix  Status:  Discontinued        1,000 mg 200 mL/hr over 60 Minutes Intravenous Every 12 hours 05/12/20 1749 05/16/20 1227   05/12/20 1830  vancomycin (VANCOREADY) IVPB 1500 mg/300 mL        1,500 mg 150 mL/hr over 120 Minutes Intravenous  Once 05/12/20 1749 05/12/20 2055   05/12/20 1800  ceFEPIme (MAXIPIME) 2 g in sodium chloride 0.9 % 100 mL IVPB  Status:  Discontinued        2 g 200 mL/hr over 30 Minutes Intravenous Every 8 hours 05/12/20 1749 05/17/20 0938   05/12/20 1200  azithromycin (ZITHROMAX) 500 mg in sodium chloride 0.9 % 250 mL IVPB  Status:  Discontinued        500 mg 250 mL/hr over 60 Minutes Intravenous  Once 05/11/20 1707 05/12/20 0755   05/12/20 1200  azithromycin (ZITHROMAX) 500 mg in sodium chloride 0.9 % 250 mL IVPB  Status:  Discontinued        500 mg 250 mL/hr over 60 Minutes Intravenous  Every 24 hours 05/12/20 0749 05/12/20 1723   05/12/20 1100  cefTRIAXone (ROCEPHIN) 1 g in sodium chloride 0.9 % 100 mL IVPB  Status:  Discontinued        1 g 200 mL/hr over 30 Minutes Intravenous  Once 05/11/20 1707 05/12/20 0755   05/12/20 1000  cefTRIAXone (ROCEPHIN) 1 g in sodium chloride 0.9 % 100 mL IVPB  Status:  Discontinued        1 g 200 mL/hr over 30 Minutes Intravenous Every 24 hours 05/12/20 0749 05/12/20 1723   05/11/20 1800  azithromycin (ZITHROMAX) 500 mg in sodium chloride 0.9 % 250 mL IVPB  Status:  Discontinued        500 mg 250 mL/hr over 60 Minutes Intravenous Every 24 hours 05/11/20 1713 05/11/20 1725   05/11/20 1800  cefTRIAXone (ROCEPHIN) 1 g in sodium chloride 0.9 % 100 mL IVPB  Status:  Discontinued        1 g 200 mL/hr over 30 Minutes Intravenous Every 24 hours 05/11/20 1706 05/11/20 1709   05/11/20 0930  cefTRIAXone (ROCEPHIN) 1 g in sodium chloride 0.9 % 100 mL IVPB        1 g 200 mL/hr over 30 Minutes Intravenous  Once 05/11/20 0923 05/11/20 1143   05/11/20 0930  azithromycin (ZITHROMAX) 500 mg in sodium chloride 0.9 % 250 mL IVPB        500 mg 250 mL/hr over 60 Minutes Intravenous  Once 05/11/20  2671 05/11/20 1310     Subjective: Patient was seen and examined at bedside.  No overnight events.  He continues to have right upper quadrant abdominal pain , denies any nausea and vomiting. Blood pressure is better controlled.  Objective: Vitals:   05/17/20 0500 05/17/20 0600 05/17/20 0700 05/17/20 0800  BP: (!) 143/116 (!) 140/74 (!) 154/102 (!) 158/110  Pulse: 78 68 69 69  Resp: 20 13 17 17   Temp:    (!) 97.4 F (36.3 C)  TempSrc:    Axillary  SpO2: 92% 95% 94% 93%  Weight:      Height:        Intake/Output Summary (Last 24 hours) at 05/17/2020 1147 Last data filed at 05/17/2020 0700 Gross per 24 hour  Intake 385.09 ml  Output 750 ml  Net -364.91 ml   Filed Weights   05/11/20 0745 05/13/20 0925  Weight: 73.1 kg 77.1 kg    Examination:  General  exam: Appears calm and comfortable  Respiratory system: Clear to auscultation. Respiratory effort normal. Cardiovascular system: S1 & S2 heard, RRR. No JVD, murmurs, rubs, gallops or clicks. No pedal edema. Gastrointestinal system: Abdomen is nondistended, soft and nontender. No organomegaly or masses felt. Normal bowel sounds heard. Central nervous system: Alert and oriented. No focal neurological deficits. Extremities:  No edema, No leg swelling, No cyanosis. Skin: No rashes, lesions or ulcers Psychiatry: Judgement and insight appear normal. Mood & affect appropriate.     Data Reviewed: I have personally reviewed following labs and imaging studies  CBC: Recent Labs  Lab 05/11/20 0736 05/11/20 0736 05/12/20 0400 05/12/20 1631 05/13/20 0205 05/14/20 0212 05/15/20 0219  WBC 6.8   < > 5.9 6.1 4.4 3.7* 4.5  NEUTROABS 6.4  --   --   --   --   --   --   HGB 9.5*   < > 8.6* 8.9* 8.4* 8.8* 9.3*  HCT 31.7*   < > 29.8* 30.6* 29.5* 31.8* 32.3*  MCV 79.1*   < > 81.9 82.0 84.5 85.0 80.1  PLT 102*   < > 99* 109* 108* 115* 140*   < > = values in this interval not displayed.   Basic Metabolic Panel: Recent Labs  Lab 05/12/20 1631 05/13/20 0205 05/14/20 0212 05/15/20 0219 05/16/20 0307  NA 137 134* 132* 137 135  K 3.4* 3.7 3.6 3.5 3.4*  CL 107 106 108 106 105  CO2 22 18* 19* 19* 23  GLUCOSE 157* 106* 94 91 116*  BUN 40* 34* 30* 23 14  CREATININE 0.96 0.89 0.82 0.74 0.81  CALCIUM 7.9* 8.0* 8.1* 8.6* 8.3*  MG  --  1.9 1.9  --   --   PHOS  --  2.9 2.5  --   --    GFR: Estimated Creatinine Clearance: 89.9 mL/min (by C-G formula based on SCr of 0.81 mg/dL). Liver Function Tests: Recent Labs  Lab 05/11/20 0736 05/12/20 0400 05/12/20 1631 05/13/20 0205 05/14/20 0212  AST 43* 48* 46* 36 41  ALT 20 31 32 27 33  ALKPHOS 94 76 86 72 76  BILITOT 1.8* 1.0 1.0 0.7 1.0  PROT 6.7 5.7* 6.2* 5.6* 5.9*  ALBUMIN 3.3* 2.8* 3.0* 2.7* 2.8*   No results for input(s): LIPASE, AMYLASE in the  last 168 hours. No results for input(s): AMMONIA in the last 168 hours. Coagulation Profile: Recent Labs  Lab 05/11/20 0736 05/12/20 1634  INR 2.1* 2.1*   Cardiac Enzymes: No results for input(s): CKTOTAL, CKMB,  CKMBINDEX, TROPONINI in the last 168 hours. BNP (last 3 results) No results for input(s): PROBNP in the last 8760 hours. HbA1C: No results for input(s): HGBA1C in the last 72 hours. CBG: Recent Labs  Lab 05/12/20 1622 05/16/20 1608  GLUCAP 148* 100*   Lipid Profile: No results for input(s): CHOL, HDL, LDLCALC, TRIG, CHOLHDL, LDLDIRECT in the last 72 hours. Thyroid Function Tests: No results for input(s): TSH, T4TOTAL, FREET4, T3FREE, THYROIDAB in the last 72 hours. Anemia Panel: No results for input(s): VITAMINB12, FOLATE, FERRITIN, TIBC, IRON, RETICCTPCT in the last 72 hours. Sepsis Labs: Recent Labs  Lab 05/11/20 0736 05/11/20 1057 05/12/20 1634 05/12/20 1904  LATICACIDVEN 4.2* 2.4* 3.4* 2.8*    Recent Results (from the past 240 hour(s))  Blood Culture (routine x 2)     Status: None   Collection Time: 05/11/20  7:35 AM   Specimen: BLOOD RIGHT FOREARM  Result Value Ref Range Status   Specimen Description   Final    BLOOD RIGHT FOREARM Performed at [facility], [address] [place], [place] [address]    Special Requests   Final    BOTTLES DRAWN AEROBIC AND ANAEROBIC Blood Culture adequate volume Performed at [facility], [address] [place], [place] [address]    Culture   Final    NO GROWTH 5 DAYS Performed at [facility], [address], [place], [place] [address]    Report Status 05/16/2020 FINAL  Final  Blood Culture (routine x 2)     Status: None   Collection Time: 05/11/20  7:36 AM   Specimen: BLOOD LEFT ARM  Result Value Ref Range Status   Specimen Description   Final    BLOOD LEFT ARM Performed at [facility], [address] [place], [place] [address]    Special Requests   Final     BOTTLES DRAWN AEROBIC AND ANAEROBIC Blood Culture adequate volume Performed at [facility], [address] [place], [place] [address]    Culture   Final    NO GROWTH 5 DAYS Performed at [facility], [address] [place], [place] [address]    Report Status 05/16/2020 FINAL  Final  Urine culture     Status: Abnormal   Collection Time: 05/11/20  7:36 AM   Specimen: In/Out Cath Urine  Result Value Ref Range Status   Specimen Description   Final    IN/OUT CATH URINE Performed at [facility], [address] [place], [place] [address]    Special Requests   Final    NONE Performed at [facility], [address] [place], [place] [address]    Culture (A)  Final    3,000 COLONIES/mL KLEBSIELLA PNEUMONIAE Confirmed Extended Spectrum Beta-Lactamase Producer (ESBL).  In bloodstream infections from ESBL organisms, carbapenems are preferred over piperacillin/tazobactam. They are shown to have a lower risk of mortality.    Report Status 05/13/2020 FINAL  Final   Organism ID, Bacteria KLEBSIELLA PNEUMONIAE (A)  Final      Susceptibility   Klebsiella pneumoniae - MIC*    AMPICILLIN >=32 RESISTANT Resistant     CEFAZOLIN >=64 RESISTANT Resistant     CEFTRIAXONE >=64 RESISTANT Resistant     CIPROFLOXACIN 2 INTERMEDIATE Intermediate     GENTAMICIN >=16 RESISTANT Resistant     IMIPENEM <=0.25 SENSITIVE Sensitive     NITROFURANTOIN 64 INTERMEDIATE Intermediate  TRIMETH/SULFA >=320 RESISTANT Resistant     AMPICILLIN/SULBACTAM >=32 RESISTANT Resistant     PIP/TAZO 16 SENSITIVE Sensitive     * 3,000 COLONIES/mL KLEBSIELLA PNEUMONIAE  SARS Coronavirus 2 by RT PCR (hospital order, performed in [facility] hospital lab) Nasopharyngeal Nasopharyngeal Swab     Status: None   Collection Time: 05/11/20  8:00 AM   Specimen: Nasopharyngeal Swab  Result Value Ref Range Status   SARS Coronavirus 2 NEGATIVE NEGATIVE Final    Comment: (NOTE) SARS-CoV-2 target  nucleic acids are NOT DETECTED.  The SARS-CoV-2 RNA is generally detectable in upper and lower respiratory specimens during the acute phase of infection. The lowest concentration of SARS-CoV-2 viral copies this assay can detect is 250 copies / mL. A negative result does not preclude SARS-CoV-2 infection and should not be used as the sole basis for treatment or other patient management decisions.  A negative result may occur with improper specimen collection / handling, submission of specimen other than nasopharyngeal swab, presence of viral mutation(s) within the areas targeted by this assay, and inadequate number of viral copies (<250 copies / mL). A negative result must be combined with clinical observations, patient history, and epidemiological information.  Fact Sheet for Patients:   BoilerBrush.com.cy  Fact Sheet for Healthcare Providers: https://pope.com/  This test is not yet approved or  cleared by the [place] FDA and has been authorized for detection and/or diagnosis of SARS-CoV-2 by FDA under an Emergency Use Authorization (EUA).  This EUA will remain in effect (meaning this test can be used) for the duration of the COVID-19 declaration under Section 564(b)(1) of the Act, 21 U.S.C. section 360bbb-3(b)(1), unless the authorization is terminated or revoked sooner.  Performed at [facility], [address] [place], [place] [address]   MRSA PCR Screening     Status: None   Collection Time: 05/13/20  7:50 AM   Specimen: Nasal Mucosa; Nasopharyngeal  Result Value Ref Range Status   MRSA by PCR NEGATIVE NEGATIVE Final    Comment:        The GeneXpert MRSA Assay (FDA approved for NASAL specimens only), is one component of a comprehensive MRSA colonization surveillance program. It is not intended to diagnose MRSA infection nor to guide or monitor treatment for MRSA infections. Performed at [facility], [address], [place], [place] [address]      Radiology Studies: No results found.  Scheduled Meds: . amoxicillin-clavulanate  1 tablet Oral Q12H  . apixaban  5 mg Oral BID  . carvedilol  6.25 mg Oral BID WC  . Chlorhexidine Gluconate Cloth  6 each Topical Daily  . losartan  12.5 mg Oral Once  . [START ON 05/18/2020] losartan  25 mg Oral Daily  . mouth rinse  15 mL Mouth Rinse BID  . sodium bicarbonate  650 mg Oral BID   Continuous Infusions: . sodium chloride 10 mL/hr at 05/17/20 0600     LOS: 6 days    Time spent: 35 mins.   Cipriano Bunker, MD [facility]   If 7PM-7AM, please contact night-coverage

## 2020-05-17 NOTE — TOC Progression Note (Signed)
Transition of Care [facility]) - Progression Note    Patient Details  Name: Carlos Welch MRN: [id number] Date of Birth: Dec 13, 1945  [facility]) CM/SW Contact  Golda Acre, RN Phone Number: 05/17/2020, 7:57 AM  Clinical Narrative:    s-home in 1-2 days as of 080121/improving. BCX2X5days=neg. Iv maxipime. Plan-following for dc and needs.   Expected Discharge Plan: Home/Self Care Barriers to Discharge: Continued Medical Work up  Expected Discharge Plan and Services Expected Discharge Plan: Home/Self Care   Discharge Planning Services: CM Consult   Living arrangements for the past 2 months: Single Family Home                                       Social Determinants of Health (SDOH) Interventions    Readmission Risk Interventions No flowsheet data found.

## 2020-05-17 NOTE — Progress Notes (Signed)
ANTICOAGULATION CONSULT NOTE - Initial Consult  Pharmacy Consult for Heparin while Eliquis on hold Indication: atrial fibrillation, AVR  No Known Allergies  Patient Measurements: Height: 6\' 2"  (188 cm) Weight: 77.1 kg (170 lb) IBW/kg (Calculated) : 82.2 Heparin Dosing Weight: actual weight  Vital Signs: Temp: 96.6 F (35.9 C) (08/02 1200) Temp Source: Axillary (08/02 1200) BP: 147/96 (08/02 1700) Pulse Rate: 68 (08/02 1700)  Labs: Recent Labs    05/15/20 0219 05/16/20 0307 05/17/20 1412  HGB 9.3*  --   --   HCT 32.3*  --   --   PLT 140*  --   --   CREATININE 0.74 0.81 0.76    Estimated Creatinine Clearance: 91 mL/min (by C-G formula based on SCr of 0.76 mg/dL).   Medical History: Past Medical History:  Diagnosis Date  . A-fib (HCC)   . Aortic stenosis    a. s/p pericardial AVR 2015.  . Bacterial endocarditis   . CAD (coronary artery disease)    a. s/p CABG, AVR, LAA clipping 2015 at [facility].  . Chronic combined systolic and diastolic CHF (congestive heart failure) (HCC)   . History of noncompliance with medical treatment   . Hypertension   . Malnutrition (HCC)   . MRSA bacteremia   . 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 TIA (transient ischemic attack)   . Unintentional weight loss     Medications:  Scheduled:  . amoxicillin-clavulanate  1 tablet Oral Q12H  . carvedilol  6.25 mg Oral BID WC  . Chlorhexidine Gluconate Cloth  6 each Topical Daily  . [START ON 05/18/2020] losartan  25 mg Oral Daily  . mouth rinse  15 mL Mouth Rinse BID  . sodium bicarbonate  650 mg Oral BID   Infusions:  . sodium chloride 10 mL/hr at 05/17/20 1734   PRN: sodium chloride, acetaminophen **OR** acetaminophen, albuterol, diphenhydrAMINE, hydrALAZINE, morphine injection, [DISCONTINUED] ondansetron **OR** ondansetron (ZOFRAN) IV, polyethylene glycol, traZODone  Assessment: 74 yo male on chronic Eliquis for afib and hx aortic valve replacement .   Pharmacy consulted to dose IV heparin while Eliquis on hold for possible percutaneous drain if HIDA positive for cholecystitis.  Last dose of eliquis today at 10:15  Goal of Therapy:  PTT 66-102 seconds Heparin level 0.3-0.7 units/ml once effects of eliquis diminish Monitor platelets by anticoagulation protocol: Yes   Plan:   At 22:00, give heparin 2500 units IV bolus  Begin IV heparin infusion 1400 units/hr  Check PTT and heparin level in 8hrs  Monitor therapy using both PTT and heparin levels for now, one effects of DOAC diminish and both correlate, can monitor using heparin levels only  Daily CBC, monitor for signs/symptoms of bleeding  61, PharmD, BCPS Pharmacy: [phone] 05/17/2020,6:23 PM

## 2020-05-18 ENCOUNTER — Inpatient Hospital Stay (HOSPITAL_COMMUNITY): Payer: Medicare Other

## 2020-05-18 DIAGNOSIS — R1084 Generalized abdominal pain: Secondary | ICD-10-CM

## 2020-05-18 LAB — CBC
HCT: 32.9 % — ABNORMAL LOW (ref 39.0–52.0)
Hemoglobin: 9.6 g/dL — ABNORMAL LOW (ref 13.0–17.0)
MCH: 23.5 pg — ABNORMAL LOW (ref 26.0–34.0)
MCHC: 29.2 g/dL — ABNORMAL LOW (ref 30.0–36.0)
MCV: 80.4 fL (ref 80.0–100.0)
Platelets: 182 10*3/uL (ref 150–400)
RBC: 4.09 MIL/uL — ABNORMAL LOW (ref 4.22–5.81)
RDW: 19.4 % — ABNORMAL HIGH (ref 11.5–15.5)
WBC: 5.6 10*3/uL (ref 4.0–10.5)
nRBC: 0.4 % — ABNORMAL HIGH (ref 0.0–0.2)

## 2020-05-18 LAB — BASIC METABOLIC PANEL
Anion gap: 8 (ref 5–15)
BUN: 15 mg/dL (ref 8–23)
CO2: 23 mmol/L (ref 22–32)
Calcium: 8.4 mg/dL — ABNORMAL LOW (ref 8.9–10.3)
Chloride: 104 mmol/L (ref 98–111)
Creatinine, Ser: 0.79 mg/dL (ref 0.61–1.24)
GFR calc Af Amer: >60 mL/min (ref >60)
GFR calc non Af Amer: >60 mL/min (ref >60)
Glucose, Bld: 100 mg/dL — ABNORMAL HIGH (ref 70–99)
Potassium: 3.4 mmol/L — ABNORMAL LOW (ref 3.5–5.1)
Sodium: 135 mmol/L (ref 135–145)

## 2020-05-18 LAB — HEPATIC FUNCTION PANEL
ALT: 21 U/L (ref 0–44)
AST: 19 U/L (ref 15–41)
Albumin: 2.9 g/dL — ABNORMAL LOW (ref 3.5–5.0)
Alkaline Phosphatase: 92 U/L (ref 38–126)
Bilirubin, Direct: 0.3 mg/dL — ABNORMAL HIGH (ref 0.0–0.2)
Indirect Bilirubin: 0.9 mg/dL (ref 0.3–0.9)
Total Bilirubin: 1.2 mg/dL (ref 0.3–1.2)
Total Protein: 6 g/dL — ABNORMAL LOW (ref 6.5–8.1)

## 2020-05-18 LAB — HEPARIN LEVEL (UNFRACTIONATED)
Heparin Unfractionated: 2 IU/mL — ABNORMAL HIGH (ref 0.30–0.70)
Heparin Unfractionated: 1.36 IU/mL — ABNORMAL HIGH (ref 0.30–0.70)

## 2020-05-18 LAB — APTT
aPTT: 86 seconds — ABNORMAL HIGH (ref 24–36)
aPTT: 71 seconds — ABNORMAL HIGH (ref 24–36)

## 2020-05-18 LAB — MAGNESIUM: Magnesium: 1.9 mg/dL (ref 1.7–2.4)

## 2020-05-18 LAB — PHOSPHORUS: Phosphorus: 2.9 mg/dL (ref 2.5–4.6)

## 2020-05-18 MED ORDER — POTASSIUM CHLORIDE CRYS ER 20 MEQ PO TBCR
20.0000 meq | EXTENDED_RELEASE_TABLET | Freq: Once | ORAL | Status: AC
  Administered 2020-05-18: 20 meq via ORAL
  Filled 2020-05-18: qty 1

## 2020-05-18 MED ORDER — LOSARTAN POTASSIUM 50 MG PO TABS
50.0000 mg | ORAL_TABLET | Freq: Every day | ORAL | Status: DC
  Administered 2020-05-19: 50 mg via ORAL
  Filled 2020-05-18 (×2): qty 1

## 2020-05-18 MED ORDER — HEPARIN (PORCINE) 25000 UT/250ML-% IV SOLN
1450.0000 [IU]/h | INTRAVENOUS | Status: DC
  Administered 2020-05-19: 1450 [IU]/h via INTRAVENOUS
  Filled 2020-05-18: qty 250

## 2020-05-18 MED ORDER — MORPHINE SULFATE (PF) 2 MG/ML IV SOLN: 1.0000 mg | INTRAVENOUS | Status: DC | PRN

## 2020-05-18 MED ORDER — LOSARTAN POTASSIUM 25 MG PO TABS
25.0000 mg | ORAL_TABLET | Freq: Once | ORAL | Status: AC
  Administered 2020-05-18: 25 mg via ORAL
  Filled 2020-05-18: qty 1

## 2020-05-18 MED ORDER — TECHNETIUM TC 99M MEBROFENIN IV KIT: 5.1200 | PACK | Freq: Once | INTRAVENOUS | Status: DC | PRN

## 2020-05-18 NOTE — Progress Notes (Addendum)
Patient currently in Nuclear Medicine.  BP still not controlled.  Recommend increasing Losartan to 50mg  daily.  Will see tomorrow.

## 2020-05-18 NOTE — Assessment & Plan Note (Signed)
Sepsis present on admission, temp 102.6, respiratory rate 24, lactic acid 4.2,.  Chest x-ray rounded lateral basilar opacity may reflect nodule or rounded atelectasis. Initiated on ceftriaxone and Zithromax for community-acquired pneumonia. Blood cultures: No growth so far,  urine culture grew 3K Klebsiella pneumonie Antibiotics changed with Vancomycin and cefepime given history of MRSA and possible valvular abscess. CT chest shows bilateral loculated effusion with opacity concerning for malignancy. Pulmonology consulted, helps with critical care management. Pulmonology recommended longer antibiotic course, effusion very small to drain. Continue current antibiotics. MRSA nares negative, will de-escalate antibiotics. IV antibiotics switched to Augmentin to complete 14-day course.

## 2020-05-18 NOTE — Progress Notes (Signed)
ANTICOAGULATION CONSULT NOTE -  Pharmacy Consult for Heparin while Eliquis on hold Indication: atrial fibrillation, AVR  No Known Allergies  Patient Measurements: Height: 6\' 2"  (188 cm) Weight: 77.1 kg (170 lb) IBW/kg (Calculated) : 82.2 Heparin Dosing Weight: actual weight  Vital Signs: Temp: 97.7 F (36.5 C) (08/03 0553) Temp Source: Oral (08/03 0553) BP: 135/95 (08/03 0553) Pulse Rate: 65 (08/03 0553)  Labs: Recent Labs    05/16/20 0307 05/17/20 1412 05/17/20 2056 05/18/20 0547  HGB  --   --   --  9.6*  HCT  --   --   --  32.9*  PLT  --   --   --  182  APTT  --   --  40* 86*  HEPARINUNFRC  --   --   --  2.00*  CREATININE 0.81 0.76  --  0.79    Estimated Creatinine Clearance: 91 mL/min (by C-G formula based on SCr of 0.79 mg/dL).   Medical History: Past Medical History:  Diagnosis Date  . A-fib (HCC)   . Aortic stenosis    a. s/p pericardial AVR 2015.  . Bacterial endocarditis   . CAD (coronary artery disease)    a. s/p CABG, AVR, [facility]AA clipping [facility] at [facility].  . Chronic combined systolic and diastolic CHF (congestive heart failure) (HCC)   . History of noncompliance with medical treatment   . Hypertension   . Malnutrition (HCC)   . MRSA bacteremia   . 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 TIA (transient ischemic attack)   . Unintentional weight loss     Medications:  Scheduled:  . amoxicillin-clavulanate  1 tablet Oral Q12H  . carvedilol  6.25 mg Oral BID WC  . Chlorhexidine Gluconate Cloth  6 each Topical Daily  . losartan  25 mg Oral Daily  . mouth rinse  15 mL Mouth Rinse BID   Infusions:  . sodium chloride 10 mL/hr at 05/17/20 2100  . heparin 1,400 Units/hr (05/17/20 2138)   PRN: sodium chloride, acetaminophen **OR** acetaminophen, albuterol, diphenhydrAMINE, hydrALAZINE, morphine injection, [DISCONTINUED] ondansetron **OR** ondansetron (ZOFRAN) IV, polyethylene glycol, traZODone  Assessment: 74 yo male on  chronic Eliquis for afib and hx aortic valve replacement .  Pharmacy consulted to dose IV heparin while Eliquis on hold for possible percutaneous drain if HIDA positive for cholecystitis.  Last dose of eliquis 8/2 at 10:15  05/18/2020 APTT 86, therapeutic, HL 2.0 Hgb 9.6, Plts 182 No bleeding reported   Goal of Therapy:  PTT 66-102 seconds Heparin level 0.3-0.7 units/ml once effects of eliquis diminish Monitor platelets by anticoagulation protocol: Yes   Plan:    Continue IV heparin infusion at 1400 units/hr  Check PTT and heparin level in 8hrs  Monitor therapy using both PTT and heparin levels for now, one effects of DOAC diminish and both correlate, can monitor using heparin levels only  Daily CBC, monitor for signs/symptoms of bleeding  07/18/2020 RPh 05/18/2020, 8:02 AM

## 2020-05-18 NOTE — Hospital Course (Addendum)
Seng Larch is a 74 yo male with PMH of CHF, atrial fibrillation on Eliquis, poor outpatient follow-ups, and left against medical advice multiple times with unknown remaining history,  no records on file who presented with several day history of shortness of breath, malaise, fever.  The patient reports he originally lives in [place] where he receives his medical care, but has recently moved to [place] to live with his daughter. He reports prolonged hospitalization in [facility] for subacute bacterial endocarditis, MRSA bacteremia.  His daughter reports to the ED physician that patient had prosthetic valve replacement 6 years ago and then after that he never followed up with his doctors.  He has recurrent hospitalization for CHF exacerbation but then he ended up signing against medical advice, Patient reports orthopnea, exertional dyspnea with bilateral leg swelling.  He self titrates his Lasix medication.  He reports tmax of 102.F at home earlier today.  He was admitted from med [facility] for sepsis secondary to community-acquired pneumonia and CHF exacerbation.  Cardiology consulted,  recommended continue antibiotics, given history of subacute bacterial endocarditis, MRSA bacteremia patient underwent TEE with normal LV function,  no evidence for vegetation or abscess.  CT chest shows bilateral loculated effusion with opacity concerning for malignancy, pulmonology consulted recommended longer antibiotics course.  He had severe abdominal pain followed by transient shock requiring pressors, he was accepted in the ICU,  managed with Levophed.  Blood pressure has improved, transferred back to SDU.  General surgery consulted for right upper quadrant abdominal pain,  HIDA scan was ordered which showed a patent biliary tree.  After further discussion, the patient wished to continue lifestyle modification and diet changes in efforts to avoid cholecystostomy drain and/or surgery.  He was  discharged home with an ongoing course of Augmentin to complete.  He was also started on your ursodiol per surgery which was continued to see if any relief with ongoing use at discharge.  Also, prior to discharge his daughter was called and findings of his CT chest were discussed with her.  Patient had an opacity appreciated in the right lung base measuring 2.6 x 1.6 cm.  Etiology was difficult to determine but included atelectasis versus effusion versus mass.  His daughter was instructed to have patient establish care with primary care upon discharge and to pursue further repeat imaging after he recovers from his infections.  She did voice understanding to this and states that that was their first priority after discharge.

## 2020-05-18 NOTE — Assessment & Plan Note (Signed)
-   continue routine turning and nursing care

## 2020-05-18 NOTE — Progress Notes (Signed)
ANTICOAGULATION CONSULT NOTE -  Pharmacy Consult for Heparin while Eliquis on hold Indication: atrial fibrillation, AVR  No Known Allergies  Patient Measurements: Height: 6\' 2"  (188 cm) Weight: 77.1 kg (170 lb) IBW/kg (Calculated) : 82.2 Heparin Dosing Weight: actual weight  Vital Signs: Temp: 97.8 F (36.6 C) (08/03 1350) Temp Source: Oral (08/03 1350) BP: 160/103 (08/03 1350) Pulse Rate: 73 (08/03 1350)  Labs: Recent Labs    05/16/20 0307 05/17/20 1412 05/17/20 2056 05/18/20 0547 05/18/20 1444  HGB  --   --   --  9.6*  --   HCT  --   --   --  32.9*  --   PLT  --   --   --  182  --   APTT  --   --  40* 86* 71*  HEPARINUNFRC  --   --   --  2.00* 1.36*  CREATININE 0.81 0.76  --  0.79  --     Estimated Creatinine Clearance: 91 mL/min (by C-G formula based on SCr of 0.79 mg/dL).   Medical History: Past Medical History:  Diagnosis Date  . A-fib (HCC)   . Aortic stenosis    a. s/p pericardial AVR 2015.  . Bacterial endocarditis   . CAD (coronary artery disease)    a. s/p CABG, AVR, LAA clipping 2015 at [facility].  . Chronic combined systolic and diastolic CHF (congestive heart failure) (HCC)   . History of noncompliance with medical treatment   . Hypertension   . Malnutrition (HCC)   . MRSA bacteremia   . 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 TIA (transient ischemic attack)   . Unintentional weight loss     Medications:  Scheduled:  . amoxicillin-clavulanate  1 tablet Oral Q12H  . carvedilol  6.25 mg Oral BID WC  . Chlorhexidine Gluconate Cloth  6 each Topical Daily  . [START ON 05/19/2020] losartan  50 mg Oral Daily  . mouth rinse  15 mL Mouth Rinse BID   Infusions:  . sodium chloride 10 mL/hr at 05/17/20 2100  . heparin 1,400 Units/hr (05/18/20 1205)   PRN: sodium chloride, acetaminophen **OR** acetaminophen, albuterol, diphenhydrAMINE, hydrALAZINE, morphine injection, [DISCONTINUED] ondansetron **OR** ondansetron (ZOFRAN) IV,  polyethylene glycol, technetium TC 60M mebrofenin, traZODone  Assessment: 74 yo male on chronic Eliquis for afib and hx aortic valve replacement .  Pharmacy consulted to dose IV heparin while Eliquis on hold for possible percutaneous drain if HIDA positive for cholecystitis.  Last dose of eliquis 8/2 at 10:15  05/18/2020 APTT 71 at low-end of therapeutic, HL 1.36 supratherapeutic as expected Hgb 9.6, Plts 182 No bleeding reported   Goal of Therapy:  PTT 66-102 seconds Heparin level 0.3-0.7 units/ml once effects of eliquis diminish Monitor platelets by anticoagulation protocol: Yes   Plan:    Increase IV heparin infusion slightly to 1450 units/hr to prevent decrease into subtherapeutic range  Check PTT and heparin level in AM  Monitor therapy using both PTT and heparin levels for now, one effects of DOAC diminish and both correlate, can monitor using heparin levels only  Daily CBC, monitor for signs/symptoms of bleeding  07/18/2020, PharmD, BCPS Pharmacy: [phone] 05/18/2020, 4:36 PM

## 2020-05-18 NOTE — Assessment & Plan Note (Addendum)
-   appears to have symptomatic cholelithiasis - HIDA negative; Ultrasound negative for acute cholecystitis -Discharged with Augmentin to complete course -Outpatient follow-up with surgery if needed for any further recurrent pain

## 2020-05-18 NOTE — TOC Progression Note (Signed)
Transition of Care [facility]) - Progression Note    Patient Details  Name: Carlos Welch MRN: [id number] Date of Birth: August 10, 1946  Transition [facility]) CM/SW Contact  Armanda Heritage, RN Phone Number: 05/18/2020, 10:09 AM  Clinical Narrative:  CM spoke with patient regarding pcp needs and was referred to his daughter.  Daughter reports patient has an appointment on 8/25 at the ed center [facility] to establish primary care.       Expected Discharge Plan: Home/Self Care Barriers to Discharge: Continued Medical Work up  Expected Discharge Plan and Services Expected Discharge Plan: Home/Self Care   Discharge Planning Services: CM Consult   Living arrangements for the past 2 months: Single Family Home                                       Social Determinants of Health (SDOH) Interventions    Readmission Risk Interventions No flowsheet data found.

## 2020-05-18 NOTE — Care Management Important Message (Signed)
Important Message  Patient Details IM Letter given to the Patient Name: Carlos Welch MRN: [id number] Date of Birth: [birth year]   Medicare Important Message Given:  Yes     Caren Macadam 05/18/2020, 10:51 AM

## 2020-05-18 NOTE — Progress Notes (Signed)
[facility] Progress Note  5 Days Post-Op  Subjective: CC-  Feeling a little better today but continues to have some RUQ pain and nausea. No emesis. Received morphine last night which is helping. WBC 5.6, LFTs WNL.  Objective: Vital signs in last 24 hours: Temp:  [96.6 F (35.9 C)-98.1 F (36.7 C)] 97.7 F (36.5 C) (08/03 0553) Pulse Rate:  [62-75] 65 (08/03 0553) Resp:  [14-25] 22 (08/03 0553) BP: (127-158)/(81-116) 135/95 (08/03 0553) SpO2:  [93 %-100 %] 98 % (08/03 0553) Last BM Date: 05/14/20  Intake/Output from previous day: 08/02 0701 - 08/03 0700 In: 290.7 [I.V.:290.7] Out: 625 [Urine:625] Intake/Output this shift: No intake/output data recorded.  PE: Gen:  Alert, NAD, pleasant HEENT: EOM's intact, pupils equal and round Card:  RRR, no M/G/R heard Pulm:  CTAB, no W/R/R, rate and effort normal Abd: Soft, ND, TTP RUQ without rebound or guarding, +BS, no HSM Skin: no rashes noted, warm and dry  Lab Results:  Recent Labs    05/18/20 0547  WBC 5.6  HGB 9.6*  HCT 32.9*  PLT 182   BMET Recent Labs    05/17/20 1412 05/18/20 0547  NA 136 135  K 3.7 3.4*  CL 107 104  CO2 24 23  GLUCOSE 95 100*  BUN 14 15  CREATININE 0.76 0.79  CALCIUM 8.3* 8.4*   PT/INR No results for input(s): LABPROT, INR in the last 72 hours. CMP     Component Value Date/Time   NA 135 05/18/2020 0547   K 3.4 (L) 05/18/2020 0547   CL 104 05/18/2020 0547   CO2 23 05/18/2020 0547   GLUCOSE 100 (H) 05/18/2020 0547   BUN 15 05/18/2020 0547   CREATININE 0.79 05/18/2020 0547   CALCIUM 8.4 (L) 05/18/2020 0547   PROT 6.0 (L) 05/18/2020 0547   ALBUMIN 2.9 (L) 05/18/2020 0547   AST 19 05/18/2020 0547   ALT 21 05/18/2020 0547   ALKPHOS 92 05/18/2020 0547   BILITOT 1.2 05/18/2020 0547   GFRNONAA >60 05/18/2020 0547   GFRAA >60 05/18/2020 0547   Lipase     Component Value Date/Time   LIPASE 27 05/17/2020 1412       Studies/Results: No results  found.  Anti-infectives: Anti-infectives (From admission, onward)   Start     Dose/Rate Route Frequency Ordered Stop   05/17/20 1000  amoxicillin-clavulanate (AUGMENTIN) 875-125 MG per tablet 1 tablet     Discontinue     1 tablet Oral Every 12 hours 05/17/20 0940 05/25/20 0959   05/13/20 0600  vancomycin (VANCOCIN) IVPB 1000 mg/200 mL premix  Status:  Discontinued        1,000 mg 200 mL/hr over 60 Minutes Intravenous Every 12 hours 05/12/20 1749 05/16/20 1227   05/12/20 1830  vancomycin (VANCOREADY) IVPB 1500 mg/300 mL        1,500 mg 150 mL/hr over 120 Minutes Intravenous  Once 05/12/20 1749 05/12/20 2055   05/12/20 1800  ceFEPIme (MAXIPIME) 2 g in sodium chloride 0.9 % 100 mL IVPB  Status:  Discontinued        2 g 200 mL/hr over 30 Minutes Intravenous Every 8 hours 05/12/20 1749 05/17/20 0938   05/12/20 1200  azithromycin (ZITHROMAX) 500 mg in sodium chloride 0.9 % 250 mL IVPB  Status:  Discontinued        500 mg 250 mL/hr over 60 Minutes Intravenous  Once 05/11/20 1707 05/12/20 0755   05/12/20 1200  azithromycin (ZITHROMAX) 500 mg in sodium chloride 0.9 %  250 mL IVPB  Status:  Discontinued        500 mg 250 mL/hr over 60 Minutes Intravenous Every 24 hours 05/12/20 0749 05/12/20 1723   05/12/20 1100  cefTRIAXone (ROCEPHIN) 1 g in sodium chloride 0.9 % 100 mL IVPB  Status:  Discontinued        1 g 200 mL/hr over 30 Minutes Intravenous  Once 05/11/20 1707 05/12/20 0755   05/12/20 1000  cefTRIAXone (ROCEPHIN) 1 g in sodium chloride 0.9 % 100 mL IVPB  Status:  Discontinued        1 g 200 mL/hr over 30 Minutes Intravenous Every 24 hours 05/12/20 0749 05/12/20 1723   05/11/20 1800  azithromycin (ZITHROMAX) 500 mg in sodium chloride 0.9 % 250 mL IVPB  Status:  Discontinued        500 mg 250 mL/hr over 60 Minutes Intravenous Every 24 hours 05/11/20 1713 05/11/20 1725   05/11/20 1800  cefTRIAXone (ROCEPHIN) 1 g in sodium chloride 0.9 % 100 mL IVPB  Status:  Discontinued        1 g 200 mL/hr  over 30 Minutes Intravenous Every 24 hours 05/11/20 1706 05/11/20 1709   05/11/20 0930  cefTRIAXone (ROCEPHIN) 1 g in sodium chloride 0.9 % 100 mL IVPB        1 g 200 mL/hr over 30 Minutes Intravenous  Once 05/11/20 0923 05/11/20 1143   05/11/20 0930  azithromycin (ZITHROMAX) 500 mg in sodium chloride 0.9 % 250 mL IVPB        500 mg 250 mL/hr over 60 Minutes Intravenous  Once 05/11/20 0923 05/11/20 1310       Assessment/Plan Community-acquired pneumonia CHF Chronic atrial fibrillation on anticoagulation-Eliquis(LD 05/17/2020) Hx CABG and AVR with bioprosthesis graft Moderate to severe MR; moderate to severe TR, 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 Continue holding eliquis. He has been switched to IV heparin. He is very high risk for any surgery, therefore if HIDA is positive for acute cholecystitis will likely recommend perc drain of the gallbladder.   LOS: 7 days    Franne Forts, [facility] Surgery 05/18/2020, 8:35 AM Please see Amion for pager number during day hours 7:00am-4:30pm

## 2020-05-18 NOTE — Progress Notes (Signed)
PROGRESS NOTE    Carlos Welch   QQI:[id number]  DOB: Mar 28, 1946  DOA: 05/11/2020     7  PCP: Patient, No Pcp Per  CC: SOB  Hospital Course: Carlos Welch is a 74 yo male with PMH of CHF, atrial fibrillation on Eliquis, poor outpatient follow-ups, and left against medical advice multiple times with unknown remaining history,  no records on file who presented with several day history of shortness of breath, malaise, fever.  The patient reports he originally lives in [place] where he receives his medical care, but has recently moved to [place] to live with his daughter. He reports prolonged hospitalization in [facility] for subacute bacterial endocarditis, MRSA bacteremia.  His daughter reports to the ED physician that patient had prosthetic valve replacement 6 years ago and then after that he never followed up with his doctors.  He has recurrent hospitalization for CHF exacerbation but then he ended up signing against medical advice, Patient reports orthopnea, exertional dyspnea with bilateral leg swelling.  He self titrates his Lasix medication.  He reports tmax of 102.F at home earlier today.  He was admitted from med [facility] for sepsis secondary to community-acquired pneumonia and CHF exacerbation.  Cardiology consulted,  recommended continue antibiotics, given history of subacute bacterial endocarditis, MRSA bacteremia patient underwent TEE with normal LV function,  no evidence for vegetation or abscess.  CT chest shows bilateral loculated effusion with opacity concerning for malignancy, pulmonology consulted recommended longer antibiotics course.  He had severe abdominal pain followed by transient shock requiring pressors, he was accepted in the ICU,  managed with Levophed.  Blood pressure has improved, transferred back to SDU.  General surgery consulted for right upper quadrant abdominal pain,  HIDA scan was ordered which showed a patent biliary tree.    Interval  History:  No events overnight. Abdominal pain seems to be a little improved he says. Underwent HIDA scan today which was negative.   Old records reviewed in assessment of this patient  ROS: Constitutional: negative for chills, fatigue and fevers, Respiratory: negative for cough, Cardiovascular: negative for chest pain and Gastrointestinal: positive for abdominal pain  Assessment & Plan: Septic shock (HCC) Sepsis present on admission, temp 102.6, respiratory rate 24, lactic acid 4.2,.  Chest x-ray rounded lateral basilar opacity may reflect nodule or rounded atelectasis. Initiated on ceftriaxone and Zithromax for community-acquired pneumonia. Blood cultures: No growth so far,  urine culture grew 3K Klebsiella pneumonie Antibiotics changed with Vancomycin and cefepime given history of MRSA and possible valvular abscess. CT chest shows bilateral loculated effusion with opacity concerning for malignancy. Pulmonology consulted, helps with critical care management. Pulmonology recommended longer antibiotic course, effusion very small to drain. Continue current antibiotics. MRSA nares negative, will de-escalate antibiotics. IV antibiotics switched to Augmentin to complete 14-day course.  Acute CHF (congestive heart failure) (HCC) POA: BNP 2354, chest x-ray shows cardiomegaly, Appears euvolumic. 2D echocardiogram completed results LVEF 55-60% Continue Lasix 40 mg IV daily. Cardiology consulted, recommended TEE which ruled out vegetations, valvular abscess. Severe MR, severe TR, Not a candidate for MitraClip because of restricted leaflets.  Atrial fibrillation, chronic (HCC) - continue heparin drip and coreg  Generalized abdominal pain - appears to have symptomatic cholelithiasis - HIDA negative; Ultrasound negative for acute cholecystitis  Pressure injury of skin - continue routine turning and nursing care   Antimicrobials: Cefepime 05/12/20>>present Vanc 05/13/20>>present Augmentin  05/17/20>> present  DVT prophylaxis: heparin drip Code Status: Full Family Communication: none present Disposition Plan:  Status is: Inpatient  Remains inpatient appropriate because:Unsafe d/c plan, IV treatments appropriate due to intensity of illness or inability to take PO and Inpatient level of care appropriate due to severity of illness   Dispo: The patient is from: Home              Anticipated d/c is to: Home              Anticipated d/c date is: 3 days              Patient currently is not medically stable to d/c.       Objective: Blood pressure (!) 160/103, pulse 73, temperature 97.8 F (36.6 C), temperature source Oral, resp. rate 20, height 6\' 2"  (1.88 m), weight 77.1 kg, SpO2 94 %.  Examination: General appearance: alert, cooperative and no distress Head: Normocephalic, without obvious abnormality, atraumatic Eyes: EOMI Lungs: clear to auscultation bilaterally Heart: regular rate and rhythm and S1, S2 normal Abdomen: soft, mild TTP in RUQ; small palpable ~2cm firmness in left middle abdomen; BS present Extremities: no edema Skin: warm, dry, intact Neurologic: Grossly normal   Consultants:   Surgery  Cardiology  Procedures:   n/a  Data Reviewed: I have personally reviewed following labs and imaging studies Results for orders placed or performed during the hospital encounter of 05/11/20 (from the past 24 hour(s))  APTT     Status: Abnormal   Collection Time: 05/17/20  8:56 PM  Result Value Ref Range   aPTT 40 (H) 24 - 36 seconds  Basic metabolic panel     Status: Abnormal   Collection Time: 05/18/20  5:47 AM  Result Value Ref Range   Sodium 135 135 - 145 mmol/L   Potassium 3.4 (L) 3.5 - 5.1 mmol/L   Chloride 104 98 - 111 mmol/L   CO2 23 22 - 32 mmol/L   Glucose, Bld 100 (H) 70 - 99 mg/dL   BUN 15 8 - 23 mg/dL   Creatinine, Ser 1.610.79 0.61 - 1.24 mg/dL   Calcium 8.4 (L) 8.9 - 10.3 mg/dL   GFR calc non Af Amer >60 >60 mL/min   GFR calc Af Amer >60  >60 mL/min   Anion gap 8 5 - 15  Magnesium     Status: None   Collection Time: 05/18/20  5:47 AM  Result Value Ref Range   Magnesium 1.9 1.7 - 2.4 mg/dL  Phosphorus     Status: None   Collection Time: 05/18/20  5:47 AM  Result Value Ref Range   Phosphorus 2.9 2.5 - 4.6 mg/dL  APTT     Status: Abnormal   Collection Time: 05/18/20  5:47 AM  Result Value Ref Range   aPTT 86 (H) 24 - 36 seconds  Heparin level (unfractionated)     Status: Abnormal   Collection Time: 05/18/20  5:47 AM  Result Value Ref Range   Heparin Unfractionated 2.00 (H) 0.30 - 0.70 IU/mL  CBC     Status: Abnormal   Collection Time: 05/18/20  5:47 AM  Result Value Ref Range   WBC 5.6 4.0 - 10.5 K/uL   RBC 4.09 (L) 4.22 - 5.81 MIL/uL   Hemoglobin 9.6 (L) 13.0 - 17.0 g/dL   HCT 09.632.9 (L) 39 - 52 %   MCV 80.4 80.0 - 100.0 fL   MCH 23.5 (L) 26.0 - 34.0 pg   MCHC 29.2 (L) 30.0 - 36.0 g/dL   RDW 04.519.4 (H) 40.911.5 - 81.115.5 %   Platelets 182  150 - 400 K/uL   nRBC 0.4 (H) 0.0 - 0.2 %  Hepatic function panel     Status: Abnormal   Collection Time: 05/18/20  5:47 AM  Result Value Ref Range   Total Protein 6.0 (L) 6.5 - 8.1 g/dL   Albumin 2.9 (L) 3.5 - 5.0 g/dL   AST 19 15 - 41 U/L   ALT 21 0 - 44 U/L   Alkaline Phosphatase 92 38 - 126 U/L   Total Bilirubin 1.2 0.3 - 1.2 mg/dL   Bilirubin, Direct 0.3 (H) 0.0 - 0.2 mg/dL   Indirect Bilirubin 0.9 0.3 - 0.9 mg/dL  APTT     Status: Abnormal   Collection Time: 05/18/20  2:44 PM  Result Value Ref Range   aPTT 71 (H) 24 - 36 seconds  Heparin level (unfractionated)     Status: Abnormal   Collection Time: 05/18/20  2:44 PM  Result Value Ref Range   Heparin Unfractionated 1.36 (H) 0.30 - 0.70 IU/mL    Recent Results (from the past 240 hour(s))  Blood Culture (routine x 2)     Status: None   Collection Time: 05/11/20  7:35 AM   Specimen: BLOOD RIGHT FOREARM  Result Value Ref Range Status   Specimen Description   Final    BLOOD RIGHT FOREARM Performed at [facility], [address] [place], [place] [address]    Special Requests   Final    BOTTLES DRAWN AEROBIC AND ANAEROBIC Blood Culture adequate volume Performed at [facility], [address] [place], [place] [address]    Culture   Final    NO GROWTH 5 DAYS Performed at [facility], [address], [place], [place] [address]    Report Status 05/16/2020 FINAL  Final  Blood Culture (routine x 2)     Status: None   Collection Time: 05/11/20  7:36 AM   Specimen: BLOOD LEFT ARM  Result Value Ref Range Status   Specimen Description   Final    BLOOD LEFT ARM Performed at [facility], [address] [place], [place] [address]    Special Requests   Final    BOTTLES DRAWN AEROBIC AND ANAEROBIC Blood Culture adequate volume Performed at [facility], [address], [place], [place] [address]    Culture   Final    NO GROWTH 5 DAYS Performed at [facility], [address], [place], [place] [address]    Report Status 05/16/2020 FINAL  Final  Urine culture     Status: Abnormal   Collection Time: 05/11/20  7:36 AM   Specimen: In/Out Cath Urine  Result Value Ref Range Status   Specimen Description   Final    IN/OUT CATH URINE Performed at [facility], [address] [place], [place] [address]    Special Requests   Final    NONE Performed at [facility], [address] [place], [place] [address]    Culture (A)  Final    3,000 COLONIES/mL KLEBSIELLA PNEUMONIAE Confirmed Extended Spectrum Beta-Lactamase Producer (ESBL).  In bloodstream infections from ESBL organisms, carbapenems are preferred over piperacillin/tazobactam. They are shown to have a lower risk of mortality.    Report Status 05/13/2020 FINAL  Final   Organism ID, Bacteria KLEBSIELLA PNEUMONIAE (A)  Final      Susceptibility   Klebsiella pneumoniae - MIC*    AMPICILLIN >=32  RESISTANT Resistant     CEFAZOLIN >=64 RESISTANT Resistant     CEFTRIAXONE  >=64 RESISTANT Resistant     CIPROFLOXACIN 2 INTERMEDIATE Intermediate     GENTAMICIN >=16 RESISTANT Resistant     IMIPENEM <=0.25 SENSITIVE Sensitive     NITROFURANTOIN 64 INTERMEDIATE Intermediate     TRIMETH/SULFA >=320 RESISTANT Resistant     AMPICILLIN/SULBACTAM >=32 RESISTANT Resistant     PIP/TAZO 16 SENSITIVE Sensitive     * 3,000 COLONIES/mL KLEBSIELLA PNEUMONIAE  SARS Coronavirus 2 by RT PCR (hospital order, performed in [facility] hospital lab) Nasopharyngeal Nasopharyngeal Swab     Status: None   Collection Time: 05/11/20  8:00 AM   Specimen: Nasopharyngeal Swab  Result Value Ref Range Status   SARS Coronavirus 2 NEGATIVE NEGATIVE Final    Comment: (NOTE) SARS-CoV-2 target nucleic acids are NOT DETECTED.  The SARS-CoV-2 RNA is generally detectable in upper and lower respiratory specimens during the acute phase of infection. The lowest concentration of SARS-CoV-2 viral copies this assay can detect is 250 copies / mL. A negative result does not preclude SARS-CoV-2 infection and should not be used as the sole basis for treatment or other patient management decisions.  A negative result may occur with improper specimen collection / handling, submission of specimen other than nasopharyngeal swab, presence of viral mutation(s) within the areas targeted by this assay, and inadequate number of viral copies (<250 copies / mL). A negative result must be combined with clinical observations, patient history, and epidemiological information.  Fact Sheet for Patients:   BoilerBrush.com.cy  Fact Sheet for Healthcare Providers: https://pope.com/  This test is not yet approved or  cleared by the [place] FDA and has been authorized for detection and/or diagnosis of SARS-CoV-2 by FDA under an Emergency Use Authorization (EUA).  This EUA will remain in effect (meaning this test can be used) for the duration of the COVID-19  declaration under Section 564(b)(1) of the Act, 21 U.S.C. section 360bbb-3(b)(1), unless the authorization is terminated or revoked sooner.  Performed at [facility], [address] [place], [place] [address]   MRSA PCR Screening     Status: None   Collection Time: 05/13/20  7:50 AM   Specimen: Nasal Mucosa; Nasopharyngeal  Result Value Ref Range Status   MRSA by PCR NEGATIVE NEGATIVE Final    Comment:        The GeneXpert MRSA Assay (FDA approved for NASAL specimens only), is one component of a comprehensive MRSA colonization surveillance program. It is not intended to diagnose MRSA infection nor to guide or monitor treatment for MRSA infections. Performed at [facility], [address] [place], [place] [address]      Radiology Studies: NM Hepatobiliary Liver Func  Result Date: 05/18/2020 CLINICAL DATA:  Biliary colic EXAM: NUCLEAR MEDICINE HEPATOBILIARY IMAGING TECHNIQUE: Sequential images of the abdomen were obtained out to 60 minutes following intravenous administration of radiopharmaceutical. RADIOPHARMACEUTICALS:  5.12 mCi Tc-66m  Choletec IV COMPARISON:  None FINDINGS: Normal tracer extraction from bloodstream indicating normal hepatocellular function. Prompt excretion of tracer into biliary tree. Bowel visualized by 10 minutes. Gallbladder visualized at 33 minutes. No focal hepatic retention of tracer. IMPRESSION: Patent biliary tree. Electronically Signed   By: Ulyses Southward M.D.   On: 05/18/2020 13:08   NM Hepatobiliary Liver Func  Final Result    US Abdomen Limited RUQ  Final Result    DG Chest [facility] 1 View  Final Result    CT  ABDOMEN PELVIS W CONTRAST  Final Result    CT CHEST WO CONTRAST  Final Result    DG Chest Port 1 View  Final Result       Scheduled Meds: . amoxicillin-clavulanate  1 tablet Oral Q12H  . carvedilol  6.25 mg Oral BID WC  . Chlorhexidine Gluconate Cloth  6 each Topical Daily  . [START ON 05/19/2020]  losartan  50 mg Oral Daily  . mouth rinse  15 mL Mouth Rinse BID   PRN Meds: sodium chloride, acetaminophen **OR** acetaminophen, albuterol, diphenhydrAMINE, hydrALAZINE, morphine injection, [DISCONTINUED] ondansetron **OR** ondansetron (ZOFRAN) IV, polyethylene glycol, technetium TC 34M mebrofenin, traZODone Continuous Infusions: . sodium chloride 10 mL/hr at 05/17/20 2100  . heparin        LOS: 7 days  Time spent: Greater than 50% of the 35 minute visit was spent in counseling/coordination of care for the patient as laid out in the A&P.   Lewie Chamber, MD [facility] 05/18/2020, 5:21 PM   Contact via secure chat.  To contact the attending provider between 7A-7P or the covering provider during after hours 7P-7A, please log into the web site [email or web address] and access using universal [place] password for that web site. If you do not have the password, please call the hospital operator.

## 2020-05-18 NOTE — Assessment & Plan Note (Addendum)
-  Transitioned back to Eliquis at discharge -Coreg continued

## 2020-05-18 NOTE — Assessment & Plan Note (Addendum)
POA: BNP 2354, chest x-ray shows cardiomegaly, Appears euvolumic. 2D echocardiogram completed results LVEF 55-60% Cardiology consulted, recommended TEE which ruled out vegetations, valvular abscess. Severe MR, severe TR, Not a candidate for MitraClip because of restricted leaflets.

## 2020-05-19 LAB — CBC WITH DIFFERENTIAL/PLATELET
Abs Immature Granulocytes: 0.08 10*3/uL — ABNORMAL HIGH (ref 0.00–0.07)
Basophils Absolute: 0.1 10*3/uL (ref 0.0–0.1)
Basophils Relative: 2 %
Eosinophils Absolute: 0.2 10*3/uL (ref 0.0–0.5)
Eosinophils Relative: 5 %
HCT: 32.5 % — ABNORMAL LOW (ref 39.0–52.0)
Hemoglobin: 9.6 g/dL — ABNORMAL LOW (ref 13.0–17.0)
Immature Granulocytes: 2 %
Lymphocytes Relative: 19 %
Lymphs Abs: 0.8 10*3/uL (ref 0.7–4.0)
MCH: 23.9 pg — ABNORMAL LOW (ref 26.0–34.0)
MCHC: 29.5 g/dL — ABNORMAL LOW (ref 30.0–36.0)
MCV: 81 fL (ref 80.0–100.0)
Monocytes Absolute: 0.5 10*3/uL (ref 0.1–1.0)
Monocytes Relative: 11 %
Neutro Abs: 2.7 10*3/uL (ref 1.7–7.7)
Neutrophils Relative %: 61 %
Platelets: 191 10*3/uL (ref 150–400)
RBC: 4.01 MIL/uL — ABNORMAL LOW (ref 4.22–5.81)
RDW: 19 % — ABNORMAL HIGH (ref 11.5–15.5)
WBC: 4.4 10*3/uL (ref 4.0–10.5)
nRBC: 0 % (ref 0.0–0.2)

## 2020-05-19 LAB — COMPREHENSIVE METABOLIC PANEL
ALT: 18 U/L (ref 0–44)
AST: 17 U/L (ref 15–41)
Albumin: 2.9 g/dL — ABNORMAL LOW (ref 3.5–5.0)
Alkaline Phosphatase: 93 U/L (ref 38–126)
Anion gap: 9 (ref 5–15)
BUN: 19 mg/dL (ref 8–23)
CO2: 21 mmol/L — ABNORMAL LOW (ref 22–32)
Calcium: 8.5 mg/dL — ABNORMAL LOW (ref 8.9–10.3)
Chloride: 105 mmol/L (ref 98–111)
Creatinine, Ser: 0.65 mg/dL (ref 0.61–1.24)
GFR calc Af Amer: >60 mL/min (ref >60)
GFR calc non Af Amer: >60 mL/min (ref >60)
Glucose, Bld: 81 mg/dL (ref 70–99)
Potassium: 3.5 mmol/L (ref 3.5–5.1)
Sodium: 135 mmol/L (ref 135–145)
Total Bilirubin: 1.2 mg/dL (ref 0.3–1.2)
Total Protein: 6.1 g/dL — ABNORMAL LOW (ref 6.5–8.1)

## 2020-05-19 LAB — MAGNESIUM: Magnesium: 2 mg/dL (ref 1.7–2.4)

## 2020-05-19 LAB — HEPARIN LEVEL (UNFRACTIONATED): Heparin Unfractionated: 0.91 IU/mL — ABNORMAL HIGH (ref 0.30–0.70)

## 2020-05-19 LAB — APTT: aPTT: 95 seconds — ABNORMAL HIGH (ref 24–36)

## 2020-05-19 MED ORDER — TAMSULOSIN HCL 0.4 MG PO CAPS: 0.4000 mg | ORAL_CAPSULE | Freq: Every day | ORAL | 3 refills | Status: DC

## 2020-05-19 MED ORDER — FUROSEMIDE 40 MG PO TABS: 40.0000 mg | ORAL_TABLET | Freq: Every day | ORAL | 2 refills | Status: DC | PRN

## 2020-05-19 MED ORDER — AMOXICILLIN-POT CLAVULANATE 875-125 MG PO TABS: 1.0000 | ORAL_TABLET | Freq: Two times a day (BID) | ORAL | 0 refills | Status: AC

## 2020-05-19 MED ORDER — ATORVASTATIN CALCIUM 80 MG PO TABS: 80.0000 mg | ORAL_TABLET | Freq: Every day | ORAL | 3 refills | Status: DC

## 2020-05-19 MED ORDER — CARVEDILOL 6.25 MG PO TABS: 6.2500 mg | ORAL_TABLET | Freq: Two times a day (BID) | ORAL | 3 refills | Status: DC

## 2020-05-19 MED ORDER — URSODIOL 300 MG PO CAPS: 300.0000 mg | ORAL_CAPSULE | Freq: Two times a day (BID) | ORAL | 3 refills | Status: DC

## 2020-05-19 MED ORDER — APIXABAN 5 MG PO TABS: 5.0000 mg | ORAL_TABLET | Freq: Two times a day (BID) | ORAL | 3 refills | Status: DC

## 2020-05-19 MED ORDER — LOSARTAN POTASSIUM 50 MG PO TABS: 50.0000 mg | ORAL_TABLET | Freq: Every day | ORAL | 3 refills | Status: DC

## 2020-05-19 MED ORDER — URSODIOL 300 MG PO CAPS
300.0000 mg | ORAL_CAPSULE | Freq: Two times a day (BID) | ORAL | Status: DC
  Administered 2020-05-19: 300 mg via ORAL
  Filled 2020-05-19: qty 1

## 2020-05-19 NOTE — Progress Notes (Signed)
Progress Note  Patient Name: Carlos Welch Date of Encounter: 05/19/2020  [facility] Cardiologist: Thurmon Fair, MD   Subjective   Denies any chest pain or SOB   Inpatient Medications    Scheduled Meds: . amoxicillin-clavulanate  1 tablet Oral Q12H  . carvedilol  6.25 mg Oral BID WC  . Chlorhexidine Gluconate Cloth  6 each Topical Daily  . losartan  50 mg Oral Daily  . mouth rinse  15 mL Mouth Rinse BID   Continuous Infusions: . sodium chloride 10 mL/hr at 05/17/20 2100  . heparin 1,450 Units/hr (05/19/20 0517)   PRN Meds: sodium chloride, acetaminophen **OR** acetaminophen, albuterol, diphenhydrAMINE, hydrALAZINE, morphine injection, [DISCONTINUED] ondansetron **OR** ondansetron (ZOFRAN) IV, polyethylene glycol, technetium TC 75M mebrofenin, traZODone   Vital Signs    Vitals:   05/18/20 0917 05/18/20 1350 05/18/20 2144 05/19/20 0602  BP: (!) 150/98 (!) 160/103 (!) 127/96 (!) 140/99  Pulse: 62 73 62 (!) 59  Resp: 19 20 18  (!) 21  Temp: 98.2 F (36.8 C) 97.8 F (36.6 C) 97.7 F (36.5 C) 97.8 F (36.6 C)  TempSrc: Oral Oral Oral Oral  SpO2: 98% 94% 96% 98%  Weight:      Height:        Intake/Output Summary (Last 24 hours) at 05/19/2020 07/19/2020 Last data filed at 05/19/2020 07/19/2020 Gross per 24 hour  Intake 111.95 ml  Output 975 ml  Net -863.05 ml   Last 3 Weights 05/13/2020 05/11/2020 04/14/2014  Weight (lbs) 170 lb 161 lb 3.2 oz 183 lb  Weight (kg) 77.111 kg 73.12 kg 83.008 kg      Telemetry    Atrial fibrillation with CVR- Personally Reviewed  ECG    No new EKG to review- Personally Reviewed  Physical Exam   GEN: Well nourished, well developed in no acute distress HEENT: Normal NECK: No JVD; No carotid bruits LYMPHATICS: No lymphadenopathy CARDIAC:irregularly irregular, no murmurs, rubs, gallops RESPIRATORY:  Clear to auscultation without rales, wheezing or rhonchi  ABDOMEN: Soft, non-tender, non-distended MUSCULOSKELETAL:  No edema; No deformity    SKIN: Warm and dry NEUROLOGIC:  Alert and oriented x 3 PSYCHIATRIC:  Normal affect    Labs    High Sensitivity Troponin:   Recent Labs  Lab 05/11/20 0738 05/11/20 1057 05/12/20 1631  TROPONINIHS 98* 105* 57*      Chemistry Recent Labs  Lab 05/17/20 1412 05/18/20 0547 05/19/20 0540  NA 136 135 135  K 3.7 3.4* 3.5  CL 107 104 105  CO2 24 23 21*  GLUCOSE 95 100* 81  BUN 14 15 19   CREATININE 0.76 0.79 0.65  CALCIUM 8.3* 8.4* 8.5*  PROT 6.1* 6.0* 6.1*  ALBUMIN 2.8* 2.9* 2.9*  AST 21 19 17   ALT 25 21 18   ALKPHOS 87 92 93  BILITOT 1.5* 1.2 1.2  GFRNONAA >60 >60 >60  GFRAA >60 >60 >60  ANIONGAP 5 8 9      Hematology Recent Labs  Lab 05/15/20 0219 05/18/20 0547 05/19/20 0540  WBC 4.5 5.6 4.4  RBC 4.03* 4.09* 4.01*  HGB 9.3* 9.6* 9.6*  HCT 32.3* 32.9* 32.5*  MCV 80.1 80.4 81.0  MCH 23.1* 23.5* 23.9*  MCHC 28.8* 29.2* 29.5*  RDW 19.0* 19.4* 19.0*  PLT 140* 182 191    BNP No results for input(s): BNP, PROBNP in the last 168 hours.   DDimer No results for input(s): DDIMER in the last 168 hours.   Radiology    NM Hepatobiliary Liver Func  Result Date:  05/18/2020 CLINICAL DATA:  Biliary colic EXAM: NUCLEAR MEDICINE HEPATOBILIARY IMAGING TECHNIQUE: Sequential images of the abdomen were obtained out to 60 minutes following intravenous administration of radiopharmaceutical. RADIOPHARMACEUTICALS:  5.12 mCi Tc-23m  Choletec IV COMPARISON:  None FINDINGS: Normal tracer extraction from bloodstream indicating normal hepatocellular function. Prompt excretion of tracer into biliary tree. Bowel visualized by 10 minutes. Gallbladder visualized at 33 minutes. No focal hepatic retention of tracer. IMPRESSION: Patent biliary tree. Electronically Signed   By: Ulyses Southward M.D.   On: 05/18/2020 13:08    Cardiac Studies   TEE-no obvious vegetations.  Possible ruptured cord.  Moderate to severe mitral regurgitation  Patient Profile     74 y.o. male here with resolved septic  shock shortness of breath multiple comorbidities  Assessment & Plan    Sepsis -Pneumonia -continue IV antibiotics per TRH -No evidence of obvious vegetation on transesophageal echocardiogram. No further cardiac intervention  Acute on chronic diastolic heart failure, mitral regurgitation -he does not appear volume overloaded on exam -Mitral regurgitation, moderate to severe by echo with possible ruptured chordae with normal LVF. -Agree with Dr. Vickii Chafe, given his prior aortic valve replacement, sternotomy, I think he would be too high risk for surgical repair.  Not a candidate for MitraClip because of restricted leaflets.  -Continue with medical mgt.  -currently not on diuretics  Gallstones -Given his recent illness, would be at moderate to high risk from a cardiac perspective for any surgery.  Right now however he does appear euvolemic from a heart failure perspective. Does not have urgent need for surgery per review of their notes.   Essential hypertension --BP still elevated at times --cannot increase BB further due to resting bradycardia --continue Coreg 6.25 BID and increase Losartan to 50mg  daily --creatinine stable at 0.65  --continue to titrate ARB  as needed for BP control  AFIB --under good rate control. --continue Carvedilol for rate control --Eliquis held and currently on IV Heparin gtt --restart Eliquis when ok with TRH and Surgery  [facility] will sign off.   Medication Recommendations:  Carvedilol 6.25mg  BID, Losartan 50mg  daily (or higher if needed at discharge, Eliquis 5mg  BID Other recommendations (labs, testing, etc):  none Follow up as an outpatient:  Dr. 7-10 days after discharge    For questions or updates, please contact [facility] Please consult [email or web address] for contact info under        Signed, , MD  05/19/2020, 8:38 AM

## 2020-05-19 NOTE — Discharge Instructions (Signed)
Gallbladder Eating Plan If you have a gallbladder condition, you may have trouble digesting fats. Eating a low-fat diet can help reduce your symptoms, and may be helpful before and after having surgery to remove your gallbladder (cholecystectomy). Your health care provider may recommend that you work with a diet and nutrition specialist (dietitian) to help you reduce the amount of fat in your diet. What are tips for following this plan? General guidelines  Limit your fat intake to less than 30% of your total daily calories. If you eat around 1,800 calories each day, this is less than 60 grams (g) of fat per day.  Fat is an important part of a healthy diet. Eating a low-fat diet can make it hard to maintain a healthy body weight. Ask your dietitian how much fat, calories, and other nutrients you need each day.  Eat small, frequent meals throughout the day instead of three large meals.  Drink at least 8-10 cups of fluid a day. Drink enough fluid to keep your urine clear or pale yellow.  Limit alcohol intake to no more than 1 drink a day for nonpregnant women and 2 drinks a day for men. One drink equals 12 oz of beer, 5 oz of wine, or 1 oz of hard liquor. Reading food labels  Check Nutrition Facts on food labels for the amount of fat per serving. Choose foods with less than 3 grams of fat per serving. Shopping  Choose nonfat and low-fat healthy foods. Look for the words "nonfat," "low fat," or "fat free."  Avoid buying processed or prepackaged foods. Cooking  Cook using low-fat methods, such as baking, broiling, grilling, or boiling.  Cook with small amounts of healthy fats, such as olive oil, grapeseed oil, canola oil, or sunflower oil. What foods are recommended?   All fresh, frozen, or canned fruits and vegetables.  Whole grains.  Low-fat or non-fat (skim) milk and yogurt.  Lean meat, skinless poultry, fish, eggs, and beans.  Low-fat protein supplement powders or  drinks.  Spices and herbs. What foods are not recommended?  High-fat foods. These include baked goods, fast food, fatty cuts of meat, ice cream, french toast, sweet rolls, pizza, cheese bread, foods covered with butter, creamy sauces, or cheese.  Fried foods. These include french fries, tempura, battered fish, breaded chicken, fried breads, and sweets.  Foods with strong odors.  Foods that cause bloating and gas. Summary  A low-fat diet can be helpful if you have a gallbladder condition, or before and after gallbladder surgery.  Limit your fat intake to less than 30% of your total daily calories. This is about 60 g of fat if you eat 1,800 calories each day.  Eat small, frequent meals throughout the day instead of three large meals. This information is not intended to replace advice given to you by your health care provider. Make sure you discuss any questions you have with your health care provider. Document Revised: 01/23/2019 Document Reviewed: 11/09/2016 Elsevier Patient Education  2020 [employer]  

## 2020-05-19 NOTE — Progress Notes (Signed)
ANTICOAGULATION CONSULT NOTE -  Pharmacy Consult for Heparin while Eliquis on hold Indication: atrial fibrillation, AVR  No Known Allergies  Patient Measurements: Height: 6\' 2"  (188 cm) Weight: 77.1 kg (170 lb) IBW/kg (Calculated) : 82.2 Heparin Dosing Weight: actual weight  Vital Signs: Temp: 97.8 F (36.6 C) (08/04 0602) Temp Source: Oral (08/04 0602) BP: 140/99 (08/04 0602) Pulse Rate: 59 (08/04 0602)  Labs: Recent Labs    05/17/20 1412 05/17/20 2056 05/18/20 0547 05/18/20 1444 05/19/20 0540  HGB  --   --  9.6*  --  9.6*  HCT  --   --  32.9*  --  32.5*  PLT  --   --  182  --  191  APTT  --    < > 86* 71* 95*  HEPARINUNFRC  --   --  2.00* 1.36* 0.91*  CREATININE 0.76  --  0.79  --  0.65   < > = values in this interval not displayed.    Estimated Creatinine Clearance: 91 mL/min (by C-G formula based on SCr of 0.65 mg/dL).   Medical History: Past Medical History:  Diagnosis Date  . A-fib (HCC)   . Aortic stenosis    a. s/p pericardial AVR 2015.  . Bacterial endocarditis   . CAD (coronary artery disease)    a. s/p CABG, AVR, LAA clipping 2015 at [facility].  . Chronic combined systolic and diastolic CHF (congestive heart failure) (HCC)   . History of noncompliance with medical treatment   . Hypertension   . Malnutrition (HCC)   . MRSA bacteremia   . 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 TIA (transient ischemic attack)   . Unintentional weight loss     Medications:  Scheduled:  . amoxicillin-clavulanate  1 tablet Oral Q12H  . carvedilol  6.25 mg Oral BID WC  . Chlorhexidine Gluconate Cloth  6 each Topical Daily  . losartan  50 mg Oral Daily  . mouth rinse  15 mL Mouth Rinse BID   Infusions:  . sodium chloride 10 mL/hr at 05/17/20 2100  . heparin 1,450 Units/hr (05/19/20 0517)   PRN: sodium chloride, acetaminophen **OR** acetaminophen, albuterol, diphenhydrAMINE, hydrALAZINE, morphine injection, [DISCONTINUED] ondansetron  **OR** ondansetron (ZOFRAN) IV, polyethylene glycol, technetium TC 43M mebrofenin, traZODone  Assessment: 74 yo male on chronic Eliquis for afib and hx aortic valve replacement .  Pharmacy consulted to dose IV heparin while Eliquis on hold for possible percutaneous drain if HIDA positive for cholecystitis.  Last dose of eliquis 8/2 at 10:15  05/19/2020 APTT 95 at high-end of therapeutic, HL 0.91 supratherapeutic as expected Hgb 9.6, Plts 191 No bleeding reported   Goal of Therapy:  PTT 66-102 seconds Heparin level 0.3-0.7 units/ml once effects of eliquis diminish Monitor platelets by anticoagulation protocol: Yes   Plan:    continue IV heparin infusion at 1450 units/hr   Check PTT and heparin level in 8 hours  Monitor therapy using both PTT and heparin levels for now, one effects of DOAC diminish and both correlate, can monitor using heparin levels only  Daily CBC, monitor for signs/symptoms of bleeding  07/19/2020 RPh 05/19/2020, 7:20 AM

## 2020-05-19 NOTE — Progress Notes (Signed)
Patient discharging home with daughter.  IVs removed-WNL.  Reviewed AVS and medications over phone with daughter and patient, emphasized importance of completing antibiotic and adhering to and low-fat diet.  Verbalized understanding.  No questions at this time.  Patient awaiting arrival of daughter in NAD.

## 2020-05-19 NOTE — Progress Notes (Signed)
[facility] Progress Note  6 Days Post-Op  Subjective: CC-  Continues to have some RUQ pain and intermittent nausea. No emesis. Tolerating diet. Pain is still there but it is somewhat less. HIDA negative for acute cholecystitis.  He understands that he is high risk for surgery. He would like to try diet modification to treat symptoms for now.  Objective: Vital signs in last 24 hours: Temp:  [97.7 F (36.5 C)-98.2 F (36.8 C)] 97.8 F (36.6 C) (08/04 0602) Pulse Rate:  [59-73] 59 (08/04 0602) Resp:  [18-21] 21 (08/04 0602) BP: (127-160)/(96-103) 140/99 (08/04 0602) SpO2:  [94 %-98 %] 98 % (08/04 0602) Last BM Date: 05/14/20  Intake/Output from previous day: 08/03 0701 - 08/04 0700 In: 112 [I.V.:112] Out: 975 [Urine:975] Intake/Output this shift: No intake/output data recorded.  PE: Gen:  Alert, NAD, pleasant HEENT: EOM's intact, pupils equal and round Pulm:  rate and effort normal Abd: Soft, ND, minimal TTP RUQ/epigastric region without rebound or guarding, no HSM Skin: no rashes noted, warm and dry   Lab Results:  Recent Labs    05/18/20 0547 05/19/20 0540  WBC 5.6 4.4  HGB 9.6* 9.6*  HCT 32.9* 32.5*  PLT 182 191   BMET Recent Labs    05/18/20 0547 05/19/20 0540  NA 135 135  K 3.4* 3.5  CL 104 105  CO2 23 21*  GLUCOSE 100* 81  BUN 15 19  CREATININE 0.79 0.65  CALCIUM 8.4* 8.5*   PT/INR No results for input(s): LABPROT, INR in the last 72 hours. CMP     Component Value Date/Time   NA 135 05/19/2020 0540   K 3.5 05/19/2020 0540   CL 105 05/19/2020 0540   CO2 21 (L) 05/19/2020 0540   GLUCOSE 81 05/19/2020 0540   BUN 19 05/19/2020 0540   CREATININE 0.65 05/19/2020 0540   CALCIUM 8.5 (L) 05/19/2020 0540   PROT 6.1 (L) 05/19/2020 0540   ALBUMIN 2.9 (L) 05/19/2020 0540   AST 17 05/19/2020 0540   ALT 18 05/19/2020 0540   ALKPHOS 93 05/19/2020 0540   BILITOT 1.2 05/19/2020 0540   GFRNONAA >60 05/19/2020 0540   GFRAA >60 05/19/2020  0540   Lipase     Component Value Date/Time   LIPASE 27 05/17/2020 1412       Studies/Results: NM Hepatobiliary Liver Func  Result Date: 05/18/2020 CLINICAL DATA:  Biliary colic EXAM: NUCLEAR MEDICINE HEPATOBILIARY IMAGING TECHNIQUE: Sequential images of the abdomen were obtained out to 60 minutes following intravenous administration of radiopharmaceutical. RADIOPHARMACEUTICALS:  5.12 mCi Tc-25m  Choletec IV COMPARISON:  None FINDINGS: Normal tracer extraction from bloodstream indicating normal hepatocellular function. Prompt excretion of tracer into biliary tree. Bowel visualized by 10 minutes. Gallbladder visualized at 33 minutes. No focal hepatic retention of tracer. IMPRESSION: Patent biliary tree. Electronically Signed   By: Ulyses Southward M.D.   On: 05/18/2020 13:08    Anti-infectives: Anti-infectives (From admission, onward)   Start     Dose/Rate Route Frequency Ordered Stop   05/17/20 1000  amoxicillin-clavulanate (AUGMENTIN) 875-125 MG per tablet 1 tablet     Discontinue     1 tablet Oral Every 12 hours 05/17/20 0940 05/25/20 0959   05/13/20 0600  vancomycin (VANCOCIN) IVPB 1000 mg/200 mL premix  Status:  Discontinued        1,000 mg 200 mL/hr over 60 Minutes Intravenous Every 12 hours 05/12/20 1749 05/16/20 1227   05/12/20 1830  vancomycin (VANCOREADY) IVPB 1500 mg/300 mL  1,500 mg 150 mL/hr over 120 Minutes Intravenous  Once 05/12/20 1749 05/12/20 2055   05/12/20 1800  ceFEPIme (MAXIPIME) 2 g in sodium chloride 0.9 % 100 mL IVPB  Status:  Discontinued        2 g 200 mL/hr over 30 Minutes Intravenous Every 8 hours 05/12/20 1749 05/17/20 0938   05/12/20 1200  azithromycin (ZITHROMAX) 500 mg in sodium chloride 0.9 % 250 mL IVPB  Status:  Discontinued        500 mg 250 mL/hr over 60 Minutes Intravenous  Once 05/11/20 1707 05/12/20 0755   05/12/20 1200  azithromycin (ZITHROMAX) 500 mg in sodium chloride 0.9 % 250 mL IVPB  Status:  Discontinued        500 mg 250 mL/hr over  60 Minutes Intravenous Every 24 hours 05/12/20 0749 05/12/20 1723   05/12/20 1100  cefTRIAXone (ROCEPHIN) 1 g in sodium chloride 0.9 % 100 mL IVPB  Status:  Discontinued        1 g 200 mL/hr over 30 Minutes Intravenous  Once 05/11/20 1707 05/12/20 0755   05/12/20 1000  cefTRIAXone (ROCEPHIN) 1 g in sodium chloride 0.9 % 100 mL IVPB  Status:  Discontinued        1 g 200 mL/hr over 30 Minutes Intravenous Every 24 hours 05/12/20 0749 05/12/20 1723   05/11/20 1800  azithromycin (ZITHROMAX) 500 mg in sodium chloride 0.9 % 250 mL IVPB  Status:  Discontinued        500 mg 250 mL/hr over 60 Minutes Intravenous Every 24 hours 05/11/20 1713 05/11/20 1725   05/11/20 1800  cefTRIAXone (ROCEPHIN) 1 g in sodium chloride 0.9 % 100 mL IVPB  Status:  Discontinued        1 g 200 mL/hr over 30 Minutes Intravenous Every 24 hours 05/11/20 1706 05/11/20 1709   05/11/20 0930  cefTRIAXone (ROCEPHIN) 1 g in sodium chloride 0.9 % 100 mL IVPB        1 g 200 mL/hr over 30 Minutes Intravenous  Once 05/11/20 0923 05/11/20 1143   05/11/20 0930  azithromycin (ZITHROMAX) 500 mg in sodium chloride 0.9 % 250 mL IVPB        500 mg 250 mL/hr over 60 Minutes Intravenous  Once 05/11/20 0923 05/11/20 1310       Assessment/Plan Community-acquired pneumonia CHF Chronic atrial fibrillation on anticoagulation-Eliquis(LD 05/17/2020) Hx CABG and AVR with bioprosthesis graft Moderate to severe MR; moderate to severe TR, 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 He is still having some symptoms from gallstones, but understands that he is very high risk for  surgery. He is ok with proceeding with nonoperative management. We discussed gallbladder eating plan. Will try ursodiol but this may or may not help.    LOS: 8 days    Franne Forts, [facility] 05/19/2020, 8:48 AM Please see Amion for pager number during day hours 7:00am-4:30pm

## 2020-05-20 NOTE — Discharge Summary (Signed)
Physician Discharge Summary  Carlos Welch TDV:[id number] DOB: 1945-12-05 DOA: 05/11/2020  PCP: Patient, No Pcp Per  Admit date: 05/11/2020 Discharge date: 05/20/2020  Admitted From: Home Disposition: Home Discharging physician: Lewie Chamber, MD  Recommendations for Outpatient Follow-up:  1. Follow-up with cardiology 2. Follow-up with surgery as needed 3. Repeat CT chest in approximately 4 to 6 weeks and follow-up right lower lung possible mass  Patient discharged to home in Discharge Condition: stable CODE STATUS: Full Diet recommendation:   Hospital Course: Carlos Welch is a 74 yo male with PMH of CHF, atrial fibrillation on Eliquis, poor outpatient follow-ups, and left against medical advice multiple times with unknown remaining history,  no records on file who presented with several day history of shortness of breath, malaise, fever.  The patient reports he originally lives in [place] where he receives his medical care, but has recently moved to [place] to live with his daughter. He reports prolonged hospitalization in [facility] for subacute bacterial endocarditis, MRSA bacteremia.  His daughter reports to the ED physician that patient had prosthetic valve replacement 6 years ago and then after that he never followed up with his doctors.  He has recurrent hospitalization for CHF exacerbation but then he ended up signing against medical advice, Patient reports orthopnea, exertional dyspnea with bilateral leg swelling.  He self titrates his Lasix medication.  He reports tmax of 102.F at home earlier today.  He was admitted from med [facility] for sepsis secondary to community-acquired pneumonia and CHF exacerbation.  Cardiology consulted,  recommended continue antibiotics, given history of subacute bacterial endocarditis, MRSA bacteremia patient underwent TEE with normal LV function,  no evidence for vegetation or abscess.  CT chest shows bilateral loculated effusion  with opacity concerning for malignancy, pulmonology consulted recommended longer antibiotics course.  He had severe abdominal pain followed by transient shock requiring pressors, he was accepted in the ICU,  managed with Levophed.  Blood pressure has improved, transferred back to SDU.  General surgery consulted for right upper quadrant abdominal pain,  HIDA scan was ordered which showed a patent biliary tree.  After further discussion, the patient wished to continue lifestyle modification and diet changes in efforts to avoid cholecystostomy drain and/or surgery.  He was discharged home with an ongoing course of Augmentin to complete.  He was also started on your ursodiol per surgery which was continued to see if any relief with ongoing use at discharge.  Also, prior to discharge his daughter was called and findings of his CT chest were discussed with her.  Patient had an opacity appreciated in the right lung base measuring 2.6 x 1.6 cm.  Etiology was difficult to determine but included atelectasis versus effusion versus mass.  His daughter was instructed to have patient establish care with primary care upon discharge and to pursue further repeat imaging after he recovers from his infections.  She did voice understanding to this and states that that was their first priority after discharge.   Septic shock (HCC) Sepsis present on admission, temp 102.6, respiratory rate 24, lactic acid 4.2,.  Chest x-ray rounded lateral basilar opacity may reflect nodule or rounded atelectasis. Initiated on ceftriaxone and Zithromax for community-acquired pneumonia. Blood cultures: No growth so far,  urine culture grew 3K Klebsiella pneumonie Antibiotics changed with Vancomycin and cefepime given history of MRSA and possible valvular abscess. CT chest shows bilateral loculated effusion with opacity concerning for malignancy. Pulmonology consulted, helps with critical care management. Pulmonology recommended longer  antibiotic course, effusion very small to drain. Continue current antibiotics. MRSA nares negative, will de-escalate antibiotics. IV antibiotics switched to Augmentin to complete 14-day course.  Acute CHF (congestive heart failure) (HCC) POA: BNP 2354, chest x-ray shows cardiomegaly, Appears euvolumic. 2D echocardiogram completed results LVEF 55-60% Cardiology consulted, recommended TEE which ruled out vegetations, valvular abscess. Severe MR, severe TR, Not a candidate for MitraClip because of restricted leaflets.  Atrial fibrillation, chronic (HCC) -Transitioned back to Eliquis at discharge -Coreg continued  Generalized abdominal pain - appears to have symptomatic cholelithiasis - HIDA negative; Ultrasound negative for acute cholecystitis -Discharged with Augmentin to complete course -Outpatient follow-up with surgery if needed for any further recurrent pain  Pressure injury of skin - continue routine turning and nursing care    The patient's chronic medical conditions were treated accordingly per the patient's home medication regimen except as noted.  On day of discharge, patient was felt deemed stable for discharge. Patient/family member advised to call PCP or come back to ER if needed.   Discharge Diagnoses:   Principal Diagnosis: Septic shock [facility])  Active Hospital Problems   Diagnosis Date Noted  . Pressure injury of skin 05/12/2020  . Generalized abdominal pain   . Atrial fibrillation, chronic (HCC) 05/11/2020    Resolved Hospital Problems   Diagnosis Date Noted Date Resolved  . Septic shock (HCC) 05/11/2020 05/18/2020    Priority: High  . Community acquired pneumonia 05/11/2020 05/19/2020  . Acute CHF (congestive heart failure) (HCC) 05/11/2020 05/19/2020    Discharge Instructions    Increase activity slowly   Complete by: As directed    No wound care   Complete by: As directed      Allergies as of 05/19/2020   No Known Allergies     Medication List     STOP taking these medications   potassium chloride SA 20 MEQ tablet Commonly known as: KLOR-CON     TAKE these medications   amoxicillin-clavulanate 875-125 MG tablet Commonly known as: AUGMENTIN Take 1 tablet by mouth every 12 (twelve) hours for 7 days.   apixaban 5 MG Tabs tablet Commonly known as: Eliquis Take 1 tablet (5 mg total) by mouth in the morning and at bedtime.   atorvastatin 80 MG tablet Commonly known as: LIPITOR Take 1 tablet (80 mg total) by mouth daily.   carvedilol 6.25 MG tablet Commonly known as: COREG Take 1 tablet (6.25 mg total) by mouth 2 (two) times daily with a meal.   furosemide 40 MG tablet Commonly known as: LASIX Take 1 tablet (40 mg total) by mouth daily as needed. Take if weight gain of 3 lbs or more overnight What changed:   when to take this  reasons to take this  additional instructions   Ginkgo Biloba 40 MG Tabs Take 1 tablet by mouth daily.   HEMP OIL-VANILLYL BUTYL ETHER EX Apply 1 application topically daily.   losartan 50 MG tablet Commonly known as: COZAAR Take 1 tablet (50 mg total) by mouth daily.   multivitamin with minerals Tabs tablet Take 1 tablet by mouth daily.   tamsulosin 0.4 MG Caps capsule Commonly known as: FLOMAX Take 1 capsule (0.4 mg total) by mouth daily.   TURMERIC PO Take 1 tablet by mouth daily.   ursodiol 300 MG capsule Commonly known as: ACTIGALL Take 1 capsule (300 mg total) by mouth 2 (two) times daily.       Follow-up Information    [facility], [place]. Call.   Specialty: General  Surgery Why: as needed Contact information: [address] [place] [address] [phone]             No Known Allergies  Consultations: Cardiology Surgery  Discharge Exam: BP (!) 140/99 (BP Location: Right Arm)   Pulse (!) 59   Temp 97.8 F (36.6 C) (Oral)   Resp (!) 21   Ht  (1.88 m)   Wt 77.1 kg   SpO2 98%   BMI 21.83 kg/m  General  appearance: alert, cooperative and no distress Head: Normocephalic, without obvious abnormality, atraumatic Eyes: EOMI Lungs: clear to auscultation bilaterally Heart: regular rate and rhythm and S1, S2 normal Abdomen: soft, mild TTP in RUQ; small palpable ~2cm firmness in left middle abdomen; BS present Extremities: no edema Skin: warm, dry, intact Neurologic: Grossly normal   The results of significant diagnostics from this hospitalization (including imaging, microbiology, ancillary and laboratory) are listed below for reference.   Microbiology: Recent Results (from the past 240 hour(s))  Blood Culture (routine x 2)     Status: None   Collection Time: 05/11/20  7:35 AM   Specimen: BLOOD RIGHT FOREARM  Result Value Ref Range Status   Specimen Description   Final    BLOOD RIGHT FOREARM Performed at [facility], [address] [place], [place] [address]    Special Requests   Final    BOTTLES DRAWN AEROBIC AND ANAEROBIC Blood Culture adequate volume Performed at [facility], [address] [place], [place] [address]    Culture   Final    NO GROWTH 5 DAYS Performed at [facility], [address] [place], [place] [address]    Report Status 05/16/2020 FINAL  Final  Blood Culture (routine x 2)     Status: None   Collection Time: 05/11/20  7:36 AM   Specimen: BLOOD LEFT ARM  Result Value Ref Range Status   Specimen Description   Final    BLOOD LEFT ARM Performed at [facility], [address] [place], [place] [address]    Special Requests   Final    BOTTLES DRAWN AEROBIC AND ANAEROBIC Blood Culture adequate volume Performed at [facility], [address], [place], [place] [address]    Culture   Final    NO GROWTH 5 DAYS Performed at [facility], [address] [place], [place] [address]    Report Status 05/16/2020 FINAL  Final  Urine culture     Status: Abnormal   Collection Time: 05/11/20  7:36 AM   Specimen:  In/Out Cath Urine  Result Value Ref Range Status   Specimen Description   Final    IN/OUT CATH URINE Performed at [facility], [address] [place], [place] [address]    Special Requests   Final    NONE Performed at [facility], [address] [place], [place] [address]    Culture (A)  Final    3,000 COLONIES/mL KLEBSIELLA PNEUMONIAE Confirmed Extended Spectrum Beta-Lactamase Producer (ESBL).  In bloodstream infections from ESBL organisms, carbapenems are preferred over piperacillin/tazobactam. They are shown to have a lower risk of mortality.    Report Status 05/13/2020 FINAL  Final   Organism ID, Bacteria KLEBSIELLA PNEUMONIAE (A)  Final      Susceptibility   Klebsiella pneumoniae - MIC*    AMPICILLIN >=32 RESISTANT Resistant     CEFAZOLIN >=64  RESISTANT Resistant     CEFTRIAXONE >=64 RESISTANT Resistant     CIPROFLOXACIN 2 INTERMEDIATE Intermediate     GENTAMICIN >=16 RESISTANT Resistant     IMIPENEM <=0.25 SENSITIVE Sensitive     NITROFURANTOIN 64 INTERMEDIATE Intermediate     TRIMETH/SULFA >=320 RESISTANT Resistant     AMPICILLIN/SULBACTAM >=32 RESISTANT Resistant     PIP/TAZO 16 SENSITIVE Sensitive     * 3,000 COLONIES/mL KLEBSIELLA PNEUMONIAE  SARS Coronavirus 2 by RT PCR (hospital order, performed in [facility] hospital lab) Nasopharyngeal Nasopharyngeal Swab     Status: None   Collection Time: 05/11/20  8:00 AM   Specimen: Nasopharyngeal Swab  Result Value Ref Range Status   SARS Coronavirus 2 NEGATIVE NEGATIVE Final    Comment: (NOTE) SARS-CoV-2 target nucleic acids are NOT DETECTED.  The SARS-CoV-2 RNA is generally detectable in upper and lower respiratory specimens during the acute phase of infection. The lowest concentration of SARS-CoV-2 viral copies this assay can detect is 250 copies / mL. A negative result does not preclude SARS-CoV-2 infection and should not be used as the sole basis for treatment or other patient management  decisions.  A negative result may occur with improper specimen collection / handling, submission of specimen other than nasopharyngeal swab, presence of viral mutation(s) within the areas targeted by this assay, and inadequate number of viral copies (<250 copies / mL). A negative result must be combined with clinical observations, patient history, and epidemiological information.  Fact Sheet for Patients:   BoilerBrush.com.cy  Fact Sheet for Healthcare Providers: https://pope.com/  This test is not yet approved or  cleared by the [place] FDA and has been authorized for detection and/or diagnosis of SARS-CoV-2 by FDA under an Emergency Use Authorization (EUA).  This EUA will remain in effect (meaning this test can be used) for the duration of the COVID-19 declaration under Section 564(b)(1) of the Act, 21 U.S.C. section 360bbb-3(b)(1), unless the authorization is terminated or revoked sooner.  Performed at [facility], [address] [place], [place] [address]   MRSA PCR Screening     Status: None   Collection Time: 05/13/20  7:50 AM   Specimen: Nasal Mucosa; Nasopharyngeal  Result Value Ref Range Status   MRSA by PCR NEGATIVE NEGATIVE Final    Comment:        The GeneXpert MRSA Assay (FDA approved for NASAL specimens only), is one component of a comprehensive MRSA colonization surveillance program. It is not intended to diagnose MRSA infection nor to guide or monitor treatment for MRSA infections. Performed at [facility], [address] [place], [place] [address]      Labs: BNP (last 3 results) Recent Labs    05/11/20 0736  BNP 2,352.4*   Basic Metabolic Panel: Recent Labs  Lab 05/14/20 0212 05/14/20 0212 05/15/20 0219 05/16/20 0307 05/17/20 1412 05/18/20 0547 05/19/20 0540  NA 132*   < > 137 135 136 135 135  K 3.6   < > 3.5 3.4* 3.7 3.4* 3.5  CL 108   < > 106 105 107  104 105  CO2 19*   < > 19* 23 24 23  21*  GLUCOSE 94   < > 91 116* 95 100* 81  BUN 30*   < > 23 14 14 15 19   CREATININE 0.82   < > 0.74 0.81 0.76 0.79 0.65  CALCIUM 8.1*   < > 8.6* 8.3* 8.3* 8.4* 8.5*  MG 1.9  --   --   --   --  1.9 2.0  PHOS 2.5  --   --   --   --  2.9  --    < > = values in this interval not displayed.   Liver Function Tests: Recent Labs  Lab 05/14/20 0212 05/17/20 1412 05/18/20 0547 05/19/20 0540  AST 41 21 19 17   ALT 33 25 21 18   ALKPHOS 76 87 92 93  BILITOT 1.0 1.5* 1.2 1.2  PROT 5.9* 6.1* 6.0* 6.1*  ALBUMIN 2.8* 2.8* 2.9* 2.9*   Recent Labs  Lab 05/17/20 1412  LIPASE 27   No results for input(s): AMMONIA in the last 168 hours. CBC: Recent Labs  Lab 05/14/20 0212 05/15/20 0219 05/18/20 0547 05/19/20 0540  WBC 3.7* 4.5 5.6 4.4  NEUTROABS  --   --   --  2.7  HGB 8.8* 9.3* 9.6* 9.6*  HCT 31.8* 32.3* 32.9* 32.5*  MCV 85.0 80.1 80.4 81.0  PLT 115* 140* 182 191   Cardiac Enzymes: No results for input(s): CKTOTAL, CKMB, CKMBINDEX, TROPONINI in the last 168 hours. BNP: Invalid input(s): POCBNP CBG: Recent Labs  Lab 05/16/20 1608  GLUCAP 100*   D-Dimer No results for input(s): DDIMER in the last 72 hours. Hgb A1c No results for input(s): HGBA1C in the last 72 hours. Lipid Profile No results for input(s): CHOL, HDL, LDLCALC, TRIG, CHOLHDL, LDLDIRECT in the last 72 hours. Thyroid function studies No results for input(s): TSH, T4TOTAL, T3FREE, THYROIDAB in the last 72 hours.  Invalid input(s): FREET3 Anemia work up No results for input(s): VITAMINB12, FOLATE, FERRITIN, TIBC, IRON, RETICCTPCT in the last 72 hours. Urinalysis    Component Value Date/Time   COLORURINE AMBER (A) 05/11/2020 0735   APPEARANCEUR CLEAR 05/11/2020 0735   LABSPEC >1.030 (H) 05/11/2020 0735   PHURINE 5.0 05/11/2020 0735   GLUCOSEU NEGATIVE 05/11/2020 0735   HGBUR NEGATIVE 05/11/2020 0735   BILIRUBINUR SMALL (A) 05/11/2020 0735   KETONESUR 15 (A) 05/11/2020  0735   PROTEINUR 100 (A) 05/11/2020 0735   UROBILINOGEN 0.2 04/14/2014 1108   NITRITE NEGATIVE 05/11/2020 0735   LEUKOCYTESUR NEGATIVE 05/11/2020 0735   Sepsis Labs Invalid input(s): PROCALCITONIN,  WBC,  LACTICIDVEN Microbiology Recent Results (from the past 240 hour(s))  Blood Culture (routine x 2)     Status: None   Collection Time: 05/11/20  7:35 AM   Specimen: BLOOD RIGHT FOREARM  Result Value Ref Range Status   Specimen Description   Final    BLOOD RIGHT FOREARM Performed at [facility] [place] [facility], [address] [place], [place] [address]    Special Requests   Final    BOTTLES DRAWN AEROBIC AND ANAEROBIC Blood Culture adequate volume Performed at [facility] [place], [address] [place], [place] [address]    Culture   Final    NO GROWTH 5 DAYS Performed at [facility], [address] [place], [place] [address]    Report Status 05/16/2020 FINAL  Final  Blood Culture (routine x 2)     Status: None   Collection Time: 05/11/20  7:36 AM   Specimen: BLOOD LEFT ARM  Result Value Ref Range Status   Specimen Description   Final    BLOOD LEFT ARM Performed at [facility] [place], [address] [place], [place] [address]    Special Requests   Final    BOTTLES DRAWN AEROBIC AND ANAEROBIC Blood Culture adequate volume Performed at [facility] [place], [address] [place], [place] [address]  Culture   Final    NO GROWTH 5 DAYS Performed at [facility], [address], [place], [place] [address]    Report Status 05/16/2020 FINAL  Final  Urine culture     Status: Abnormal   Collection Time: 05/11/20  7:36 AM   Specimen: In/Out Cath Urine  Result Value Ref Range Status   Specimen Description   Final    IN/OUT CATH URINE Performed at [facility], [address] [place], [place] [address]    Special Requests   Final    NONE Performed at [facility], [address] [place], [place] [address]    Culture (A)   Final    3,000 COLONIES/mL KLEBSIELLA PNEUMONIAE Confirmed Extended Spectrum Beta-Lactamase Producer (ESBL).  In bloodstream infections from ESBL organisms, carbapenems are preferred over piperacillin/tazobactam. They are shown to have a lower risk of mortality.    Report Status 05/13/2020 FINAL  Final   Organism ID, Bacteria KLEBSIELLA PNEUMONIAE (A)  Final      Susceptibility   Klebsiella pneumoniae - MIC*    AMPICILLIN >=32 RESISTANT Resistant     CEFAZOLIN >=64 RESISTANT Resistant     CEFTRIAXONE >=64 RESISTANT Resistant     CIPROFLOXACIN 2 INTERMEDIATE Intermediate     GENTAMICIN >=16 RESISTANT Resistant     IMIPENEM <=0.25 SENSITIVE Sensitive     NITROFURANTOIN 64 INTERMEDIATE Intermediate     TRIMETH/SULFA >=320 RESISTANT Resistant     AMPICILLIN/SULBACTAM >=32 RESISTANT Resistant     PIP/TAZO 16 SENSITIVE Sensitive     * 3,000 COLONIES/mL KLEBSIELLA PNEUMONIAE  SARS Coronavirus 2 by RT PCR (hospital order, performed in [facility] hospital lab) Nasopharyngeal Nasopharyngeal Swab     Status: None   Collection Time: 05/11/20  8:00 AM   Specimen: Nasopharyngeal Swab  Result Value Ref Range Status   SARS Coronavirus 2 NEGATIVE NEGATIVE Final    Comment: (NOTE) SARS-CoV-2 target nucleic acids are NOT DETECTED.  The SARS-CoV-2 RNA is generally detectable in upper and lower respiratory specimens during the acute phase of infection. The lowest concentration of SARS-CoV-2 viral copies this assay can detect is 250 copies / mL. A negative result does not preclude SARS-CoV-2 infection and should not be used as the sole basis for treatment or other patient management decisions.  A negative result may occur with improper specimen collection / handling, submission of specimen other than nasopharyngeal swab, presence of viral mutation(s) within the areas targeted by this assay, and inadequate number of viral copies (<250 copies / mL). A negative result must be combined with  clinical observations, patient history, and epidemiological information.  Fact Sheet for Patients:   BoilerBrush.com.cy  Fact Sheet for Healthcare Providers: https://pope.com/  This test is not yet approved or  cleared by the [place] FDA and has been authorized for detection and/or diagnosis of SARS-CoV-2 by FDA under an Emergency Use Authorization (EUA).  This EUA will remain in effect (meaning this test can be used) for the duration of the COVID-19 declaration under Section 564(b)(1) of the Act, 21 U.S.C. section 360bbb-3(b)(1), unless the authorization is terminated or revoked sooner.  Performed at [facility], [address] [place], [place] [address]   MRSA PCR Screening     Status: None   Collection Time: 05/13/20  7:50 AM   Specimen: Nasal Mucosa; Nasopharyngeal  Result Value Ref Range Status   MRSA by PCR NEGATIVE NEGATIVE Final    Comment:  The GeneXpert MRSA Assay (FDA approved for NASAL specimens only), is one component of a comprehensive MRSA colonization surveillance program. It is not intended to diagnose MRSA infection nor to guide or monitor treatment for MRSA infections. Performed at [facility], [address] [place], [place] [address]     Procedures/Studies: CT CHEST WO CONTRAST  Result Date: 05/12/2020 CLINICAL DATA:  Abnormal x-ray, history of pulmonary abnormalities on chest x-ray EXAM: CT CHEST WITHOUT CONTRAST TECHNIQUE: Multidetector CT imaging of the chest was performed following the standard protocol without IV contrast. COMPARISON:  Chest x-ray May 11, 2020 FINDINGS: Cardiovascular: Post CABG with extensive native coronary artery calcification and aortic valve replacement. Generalized atheromatous plaque in the thoracic aorta without aneurysmal dilation. Heart size is enlarged without pericardial effusion. Limited assessment of heart and great vessels  due to lack of intravenous contrast. Mediastinum/Nodes: Scattered mediastinal lymph nodes upper limits of normal, likely related to reactive changes in longstanding cardiac dysfunction. No frank adenopathy. There is however some enlargement of venous confluence on the LEFT of the pulmonary veins entering the LEFT atrium. This may represent a common pulmonary venous trunk on the LEFT. Post LEFT atrial appendage clipping Lungs/Pleura: Signs of septal thickening. Small RIGHT effusion showing loculation. Also with small loculated LEFT pleural effusion. Scattered areas of volume loss and linear opacity at the lung bases predominantly. Upper Abdomen: Low-density hepatic lesions measuring water density compatible with small cysts but incompletely imaged with respect to the largest in the central LEFT hemi liver. Extensive vascular calcifications track into the abdominal aorta. Adreniform thickening of bilateral adrenal glands. No acute upper abdominal process. Musculoskeletal: Osteopenia.  Spinal degenerative changes. IMPRESSION: 1. Signs of pulmonary edema and parenchymal scarring. 2. Loculated bilateral small pleural effusions RIGHT greater than LEFT with ovoid opacity in the RIGHT lung base measuring 2.6 x 1.6 cm, likely rounded atelectasis in the setting of chronic appearing effusions. Consider follow-up or comparison with prior studies to ensure resolution and exclude possibility of pulmonary neoplasm. 3. 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 4. Post LEFT atrial appendage clipping. 5. Aortic atherosclerosis. Aortic Atherosclerosis (ICD10-I70.0). Electronically Signed   By: Donzetta Kohut M.D.   On: 05/12/2020 09:29   NM Hepatobiliary Liver Func  Result Date: 05/18/2020 CLINICAL DATA:  Biliary colic EXAM: NUCLEAR MEDICINE HEPATOBILIARY IMAGING TECHNIQUE: Sequential images of the abdomen were obtained out to 60 minutes following intravenous administration of  radiopharmaceutical. RADIOPHARMACEUTICALS:  5.12 mCi Tc-74m  Choletec IV COMPARISON:  None FINDINGS: Normal tracer extraction from bloodstream indicating normal hepatocellular function. Prompt excretion of tracer into biliary tree. Bowel visualized by 10 minutes. Gallbladder visualized at 33 minutes. No focal hepatic retention of tracer. IMPRESSION: Patent biliary tree. Electronically Signed   By: Ulyses Southward M.D.   On: 05/18/2020 13:08   CT ABDOMEN PELVIS W CONTRAST  Result Date: 05/13/2020 CLINICAL DATA:  Abdominal pain EXAM: CT ABDOMEN AND PELVIS WITH CONTRAST TECHNIQUE: Multidetector CT imaging of the abdomen and pelvis was performed using the standard protocol following bolus administration of intravenous contrast. CONTRAST:  OMNIPAQUE IOHEXOL 300 MG/ML  SOLN COMPARISON:  None. FINDINGS: Lower chest: Lung bases demonstrate bilateral pleural effusions and septal thickening similar to that seen on recent CT of the chest. Hepatobiliary: Fatty infiltration of the liver is noted. Scattered hypodensities are noted throughout the liver consistent with small cysts. The gallbladder is partially distended. Mild Peri cholecystic fluid is noted. No definitive gallstones are seen. Pancreas: Unremarkable. No pancreatic ductal  dilatation or surrounding inflammatory changes. Spleen: Normal in size without focal abnormality. Adrenals/Urinary Tract: Adrenal glands show some hypertrophy in the left adrenal. The kidneys demonstrate a normal enhancement pattern. Mild perinephric stranding is noted worse on the left than the right. Delayed images show normal excretion of contrast material. The bladder is well distended. Stomach/Bowel: Mild fecal material is noted within the colon. No obstructive or inflammatory changes are seen. The appendix is not well visualized. No inflammatory changes to suggest appendicitis are seen. Small bowel and stomach appear unremarkable. Vascular/Lymphatic: Aortic atherosclerosis. No enlarged  abdominal or pelvic lymph nodes. Retroaortic left renal vein is noted. Reproductive: Prostate is unremarkable. Other: No abdominal wall hernia or abnormality. No abdominopelvic ascites. Musculoskeletal: Degenerative changes of lumbar spine are noted. IMPRESSION: Bilateral pleural effusions and septal thickening similar to that seen on recent chest CT Fatty liver. Partial distension of the gallbladder with pericholecystic fluid. If patient has right upper quadrant localized pain ultrasound may be helpful for further evaluation. No other definitive acute abnormality is seen. Electronically Signed   By: Alcide Clever M.D.   On: 05/13/2020 00:05   DG Chest Port 1 View  Result Date: 05/13/2020 CLINICAL DATA:  Hypoxia EXAM: PORTABLE CHEST 1 VIEW COMPARISON:  Chest radiograph May 11, 2020 and chest CT May 12, 2020 FINDINGS: There is persistent pleural effusion on the right. Smaller pleural effusion present 1 day prior on the left not appreciable on current radiographic examination. There is atelectatic change in the right base with questionable superimposed pneumonia in the right base. Lungs elsewhere appear clear. There is cardiomegaly with pulmonary vascularity normal. Patient is status post coronary artery bypass grafting. There is a left atrial appendage clamp. 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 No adenopathy appreciable. There is aortic atherosclerosis. No bone lesions. IMPRESSION: Postoperative changes with stable cardiac prominence. Small right pleural effusion with suspected infiltrate and atelectasis right base. Small left pleural effusion noted previously no longer evident. Aortic Atherosclerosis (ICD10-I70.0). Electronically Signed   By: Bretta Bang III M.D.   On: 05/13/2020 06:29   DG Chest Port 1 View  Result Date: 05/11/2020 CLINICAL DATA:  Sepsis workup. EXAM: PORTABLE CHEST 1 VIEW COMPARISON:  04/14/2014 chest and abdominal radiographs. FINDINGS:  Bibasilar patchy opacities. A rounded opacity is also seen with lateral right lung base. No pneumothorax. Small right greater than left pleural effusions. Cardiomegaly. Post sternotomy sequela. Telemetry wires overlie the chest. Osteopenia.  Multilevel spondylosis. IMPRESSION: Patchy bibasilar opacities, infection versus atelectasis. Rounded lateral right basilar opacity may reflect a nodule versus rounded atelectasis. Consider CT chest for further evaluation. Cardiomegaly.  Small bilateral pleural effusions. Electronically Signed   By: Stana Bunting M.D.   On: 05/11/2020 08:06   ECHOCARDIOGRAM COMPLETE  Result Date: 05/12/2020    ECHOCARDIOGRAM REPORT   Patient Name:   [facility] Date of Exam: 05/12/2020 Medical Rec #:  [id number]  Height:       74.0 in Accession #:    [id number] Weight:       161.2 lb Date of Birth:  11/12/1945 BSA:          1.983 m Patient Age:    73 years   BP:           106/84 mmHg Patient Gender: M          HR:           73 bpm. Exam Location:  Inpatient Procedure: 2D Echo, Cardiac Doppler and Color Doppler Indications:  I48.0 Paroxysmal atrial fibrillation  History:        Patient has no prior history of Echocardiogram examinations.                 CHF, CAD, Prior CABG, TIA, Aortic Valve Disease; Risk                 Factors:Hypertension.                 Aortic Valve: unknown bioprosthetic tilting disk valve is                 present in the aortic position. Procedure Date: 2015.  Sonographer:    Elmarie Shiley Dance Referring Phys: [id number] PARDEEP KUMAR IMPRESSIONS  1. Left ventricular ejection fraction, by estimation, is 55 to 60%. The left ventricle has normal function. The left ventricle has no regional wall motion abnormalities. There is moderate concentric left ventricular hypertrophy. Left ventricular diastolic parameters are indeterminate.  2. Right ventricular systolic function is normal. The right ventricular size is normal. There is moderately elevated pulmonary artery systolic  pressure.  3. Left atrial size was severely dilated.  4. Right atrial size was severely dilated.  5. The mitral valve is normal in structure. Mild mitral valve regurgitation. No evidence of mitral stenosis.  6. There is thickening of the septal leaflet. The tricuspid valve is abnormal.  7. Perivalvular thickening around the bioprosthetic aortic valve. The valve appears to be stable and there is no rocking motion. In the setting of bacteremia concern for endocarditis/abscess. TEE recommended.. The aortic valve has been repaired/replaced. Aortic valve regurgitation is not visualized. No aortic stenosis is present. There is a unknown bioprosthetic tilting disk valve present in the aortic position. Procedure Date: 2015. Echo findings are consistent with normal structure and function of the aortic valve prosthesis. Aortic valve area, by VTI measures 1.39 cm. Aortic valve mean gradient measures 10.0 mmHg. Aortic valve Vmax measures 2.16 m/s.  8. Aortic dilatation noted. 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  9. The inferior vena cava is dilated in size with >50% respiratory variability, suggesting right atrial pressure of 8 mmHg. FINDINGS  Left Ventricle: Left ventricular ejection fraction, by estimation, is 55 to 60%. The left ventricle has normal function. The left ventricle has no regional wall motion abnormalities. The left ventricular internal cavity size was normal in size. There is  moderate concentric left ventricular hypertrophy. Left ventricular diastolic parameters are indeterminate. Right Ventricle: The right ventricular size is normal. No increase in right ventricular wall thickness. Right ventricular systolic function is normal. There is moderately elevated pulmonary artery systolic pressure. The tricuspid regurgitant velocity is 3.11 m/s, and with an assumed right atrial pressure of 8 mmHg, the estimated right ventricular systolic pressure is 46.7 mmHg. Left Atrium: Left atrial  size was severely dilated. Right Atrium: Right atrial size was severely dilated. Pericardium: There is no evidence of pericardial effusion. Mitral Valve: The mitral valve is normal in structure. Normal mobility of the mitral valve leaflets. Severe mitral annular calcification. Mild mitral valve regurgitation. No evidence of mitral valve stenosis. Tricuspid Valve: There is thickening of the septal leaflet. The tricuspid valve is abnormal. Tricuspid valve regurgitation is mild . No evidence of tricuspid stenosis. Aortic Valve: Perivalvular thickening around the bioprosthetic aortic valve. The valve appears to be stable and there is no rocking motion. In the setting of bacteremia concern for endocarditis/abscess. TEE recommended. The aortic valve has been repaired/replaced.. There is mild thickening  of the aortic valve. Aortic valve regurgitation is not visualized. No aortic stenosis is present. There is mild thickening of the aortic valve. Aortic valve mean gradient measures 10.0 mmHg. Aortic valve peak gradient measures 18.7 mmHg. Aortic valve area, by VTI measures 1.39 cm. There is a unknown bioprosthetic tilting disk valve present in the aortic position. Procedure Date: 2015. Echo findings are consistent with normal structure and function of the aortic valve prosthesis. Pulmonic Valve: The pulmonic valve was normal in structure. Pulmonic valve regurgitation is not visualized. No evidence of pulmonic stenosis. Aorta: Aortic dilatation noted. 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 Venous: The inferior vena cava is dilated in size with greater than 50% respiratory variability, suggesting right atrial pressure of 8 mmHg. IAS/Shunts: No atrial level shunt detected by color flow Doppler.  LEFT VENTRICLE PLAX 2D LVIDd:         5.50 cm LVIDs:         3.90 cm LV PW:         1.50 cm LV IVS:        1.40 cm LVOT diam:     2.40 cm LV SV:         48 LV SV Index:   24 LVOT Area:     4.52 cm  RIGHT  VENTRICLE            IVC RV Basal diam:  3.30 cm    IVC diam: 3.10 cm RV Mid diam:    3.30 cm RV S prime:     8.04 cm/s TAPSE (M-mode): 1.0 cm LEFT ATRIUM              Index        RIGHT ATRIUM           Index LA diam:        5.70 cm  2.87 cm/m   RA Area:     29.70 cm LA Vol (A2C):   192.0 ml 96.82 ml/m  RA Volume:   110.00 ml 55.47 ml/m LA Vol (A4C):   219.0 ml 110.43 ml/m LA Biplane Vol: 213.0 ml 107.41 ml/m  AORTIC VALVE AV Area (Vmax):    1.39 cm AV Area (Vmean):   1.39 cm AV Area (VTI):     1.39 cm AV Vmax:           216.00 cm/s AV Vmean:          147.500 cm/s AV VTI:            0.342 m AV Peak Grad:      18.7 mmHg AV Mean Grad:      10.0 mmHg LVOT Vmax:         66.15 cm/s LVOT Vmean:        45.300 cm/s LVOT VTI:          0.105 m LVOT/AV VTI ratio: 0.31  AORTA Ao Root diam: 4.20 cm Ao Asc diam:  3.80 cm MITRAL VALVE                TRICUSPID VALVE MV Area (PHT): 3.65 cm     TR Peak grad:   38.7 mmHg MV Decel Time: 208 msec     TR Vmax:        311.00 cm/s MV E velocity: 196.00 cm/s MV A velocity: 50.70 cm/s   SHUNTS MV E/A ratio:  3.87         Systemic VTI:  0.10 m  Systemic Diam: 2.40 cm Chilton Si MD Electronically signed by Chilton Si MD Signature Date/Time: 05/12/2020/5:59:32 PM    Final    ECHO TEE  Result Date: 05/13/2020    TRANSESOPHOGEAL ECHO REPORT   Patient Name:   [facility] Date of Exam: 05/13/2020 Medical Rec #:  [id number]  Height:       74.0 in Accession #:    [id number] Weight:       161.2 lb Date of Birth:  12-May-1946 BSA:          1.983 m Patient Age:    73 years   BP:           97/73 mmHg Patient Gender: M          HR:           74 bpm. Exam Location:  Inpatient Procedure: Transesophageal Echo, Cardiac Doppler and Color Doppler Indications:     Bacteremia  History:         Patient has prior history of Echocardiogram examinations, most                  recent 05/12/2020. CHF, Abnormal ECG, Aortic Valve Disease,                  Arrythmias:Atrial  Fibrillation; Signs/Symptoms:Bacteremia.                  Aortic valve replacement.  Sonographer:     Sheralyn Boatman RDCS Referring Phys:  [id number] Deri Fuelling MCDANIEL Diagnosing Phys: Olga Millers MD PROCEDURE: After discussion of the risks and benefits of a TEE, an informed consent was obtained from the patient. The transesophogeal probe was passed without difficulty through the esophogus of the patient. Imaged were obtained with the patient in a left lateral decubitus position. Sedation performed by different physician. The patient's vital signs; including heart rate, blood pressure, and oxygen saturation; remained stable throughout the procedure. The patient developed no complications during the procedure. IMPRESSIONS  1. 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  2. Left ventricular ejection fraction, by estimation, is 55 to 60%. The left ventricle has normal function. The left ventricle has no regional wall motion abnormalities. There is moderate left ventricular hypertrophy.  3. Right ventricular systolic function is normal. The right ventricular size is mildly enlarged.  4. Left atrial size was severely dilated. No left atrial/left atrial appendage thrombus was detected.  5. Right atrial size was severely dilated.  6. The mitral valve is abnormal. Moderate to severe mitral valve regurgitation.  7. Tricuspid valve regurgitation is moderate to severe.  8. The aortic valve has been repaired/replaced. Aortic valve regurgitation is not visualized. No aortic stenosis is present.  9. 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 FINDINGS  Left Ventricle: Left ventricular ejection fraction, by estimation, is 55 to 60%. The left ventricle has normal  function. The left ventricle has no regional wall motion abnormalities. The left ventricular internal cavity size was normal in size. There is  moderate left ventricular hypertrophy. Right Ventricle: The right ventricular size is mildly enlarged.Right ventricular systolic function is normal. Left Atrium: LAA clipped. Left atrial size was severely dilated. Spontaneous echo contrast was present. No left atrial/left atrial appendage thrombus was detected. Right Atrium: Right  atrial size was severely dilated. Pericardium: There is no evidence of pericardial effusion. Mitral Valve: The mitral valve is abnormal. There is severe thickening of the mitral valve leaflet(s). Moderate to severe mitral valve regurgitation. MV peak gradient, 6.4 mmHg. The mean mitral valve gradient is 2.5 mmHg. Tricuspid Valve: The tricuspid valve is grossly normal. Tricuspid valve regurgitation is moderate to severe. Aortic Valve: The aortic valve has been repaired/replaced. Aortic valve regurgitation is not visualized. No aortic stenosis is present. Aortic valve mean gradient measures 8.0 mmHg. Aortic valve peak gradient measures 13.4 mmHg. There is a bioprosthetic valve present in the aortic position. Pulmonic Valve: The pulmonic valve was normal in structure. Pulmonic valve regurgitation is trivial. Aorta: The aortic root is normal in size and structure. There is moderate (Grade III) plaque involving the descending aorta. IAS/Shunts: No atrial level shunt detected by color flow Doppler. 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  AORTIC VALVE AV Vmax:      183.00 cm/s AV Vmean:     138.000 cm/s AV VTI:       0.235 m AV Peak Grad: 13.4 mmHg AV Mean Grad: 8.0 mmHg MITRAL  VALVE MV Peak grad: 6.4 mmHg MV Mean grad: 2.5 mmHg MV Vmax:      1.27 m/s MV Vmean:     74.0 cm/s MR PISA:        2.26 cm MR PISA Radius: 0.60 cm Olga Millers MD Electronically signed by Olga Millers MD Signature Date/Time: 05/13/2020/2:29:55 PM    Final    US Abdomen Limited RUQ  Result Date: 05/13/2020 CLINICAL DATA:  Abdomen pain EXAM: ULTRASOUND ABDOMEN LIMITED RIGHT UPPER QUADRANT COMPARISON:  CT 05/12/2020 FINDINGS: Gallbladder: Multiple shadowing stones. Slight increased wall thickness up to 3.5 mm. Negative sonographic Murphy. Common bile duct: Diameter: 5.2 mm Liver: Echogenicity within normal limits. Multiple cysts, the largest measured cyst in the right lobe measures 2.1 x 1.4 x 2.5 cm. Portal vein is patent on color Doppler imaging with normal direction of blood flow towards the liver. Other: Small amount of perihepatic ascites.  Right pleural effusion. IMPRESSION: 1. Cholelithiasis with slight increased gallbladder wall thickness but negative sonographic Murphy. Correlation with nuclear medicine hepatobiliary imaging could be obtained if persistent concern for acute cholecystitis. 2. Trace perihepatic ascites and right pleural effusion. Electronically Signed   By: Jasmine Pang M.D.   On: 05/13/2020 16:47     Time coordinating discharge: Over 30 minutes    Lewie Chamber, MD  [facility] 05/20/2020, 5:51 PM Pager: Secure chat  If 7PM-7AM, please contact night-coverage [email or web address] Password [email or web address]

## 2020-06-09 ENCOUNTER — Ambulatory Visit (INDEPENDENT_AMBULATORY_CARE_PROVIDER_SITE_OTHER): Payer: Medicare Other | Admitting: Medical

## 2020-06-09 ENCOUNTER — Other Ambulatory Visit: Payer: Self-pay

## 2020-06-09 ENCOUNTER — Ambulatory Visit (HOSPITAL_BASED_OUTPATIENT_CLINIC_OR_DEPARTMENT_OTHER)
Admission: RE | Admit: 2020-06-09 | Discharge: 2020-06-09 | Disposition: A | Discharge status: Home / Self Care | Payer: Medicare Other | Source: Ambulatory Visit | Attending: Medical | Admitting: Medical

## 2020-06-09 VITALS — BP 158/95 | HR 73 | Resp 18 | Ht 74.0 in | Wt 169.0 lb

## 2020-06-09 DIAGNOSIS — I10 Essential (primary) hypertension: Secondary | ICD-10-CM

## 2020-06-09 DIAGNOSIS — I509 Heart failure, unspecified: Secondary | ICD-10-CM | POA: Diagnosis present

## 2020-06-09 DIAGNOSIS — I482 Chronic atrial fibrillation, unspecified: Secondary | ICD-10-CM | POA: Diagnosis not present

## 2020-06-09 DIAGNOSIS — R911 Solitary pulmonary nodule: Secondary | ICD-10-CM | POA: Diagnosis not present

## 2020-06-09 DIAGNOSIS — R918 Other nonspecific abnormal finding of lung field: Secondary | ICD-10-CM

## 2020-06-09 DIAGNOSIS — K8020 Calculus of gallbladder without cholecystitis without obstruction: Secondary | ICD-10-CM

## 2020-06-09 LAB — CBC WITH DIFFERENTIAL/PLATELET
Absolute Monocytes: 643 cells/uL (ref 200–950)
Basophils Absolute: 82 cells/uL (ref 0–200)
Basophils Relative: 1.7 %
Eosinophils Absolute: 206 cells/uL (ref 15–500)
Eosinophils Relative: 4.3 %
HCT: 32.9 % — ABNORMAL LOW (ref 38.5–50.0)
Hemoglobin: 9.7 g/dL — ABNORMAL LOW (ref 13.2–17.1)
Lymphs Abs: 763 cells/uL — ABNORMAL LOW (ref 850–3900)
MCH: 23.8 pg — ABNORMAL LOW (ref 27.0–33.0)
MCHC: 29.5 g/dL — ABNORMAL LOW (ref 32.0–36.0)
MCV: 80.6 fL (ref 80.0–100.0)
MPV: 11.2 fL (ref 7.5–12.5)
Monocytes Relative: 13.4 %
Neutro Abs: 3106 cells/uL (ref 1500–7800)
Neutrophils Relative %: 64.7 %
Platelets: 186 10*3/uL (ref 140–400)
RBC: 4.08 10*6/uL — ABNORMAL LOW (ref 4.20–5.80)
RDW: 17.3 % — ABNORMAL HIGH (ref 11.0–15.0)
Total Lymphocyte: 15.9 %
WBC: 4.8 10*3/uL (ref 3.8–10.8)

## 2020-06-09 LAB — COMPREHENSIVE METABOLIC PANEL
AG Ratio: 1.3 (calc) (ref 1.0–2.5)
ALT: 8 U/L — ABNORMAL LOW (ref 9–46)
AST: 23 U/L (ref 10–35)
Albumin: 3.9 g/dL (ref 3.6–5.1)
Alkaline phosphatase (APISO): 119 U/L (ref 35–144)
BUN: 20 mg/dL (ref 7–25)
CO2: 27 mmol/L (ref 20–32)
Calcium: 9.3 mg/dL (ref 8.6–10.3)
Chloride: 105 mmol/L (ref 98–110)
Creat: 0.81 mg/dL (ref 0.70–1.18)
Globulin: 3 g/dL (calc) (ref 1.9–3.7)
Glucose, Bld: 79 mg/dL (ref 65–99)
Potassium: 4.4 mmol/L (ref 3.5–5.3)
Sodium: 139 mmol/L (ref 135–146)
Total Bilirubin: 1 mg/dL (ref 0.2–1.2)
Total Protein: 6.9 g/dL (ref 6.1–8.1)

## 2020-06-09 MED ORDER — LOSARTAN POTASSIUM 100 MG PO TABS
100.0000 mg | ORAL_TABLET | Freq: Every day | ORAL | 3 refills | Status: DC
Start: 2020-06-09 — End: 2021-01-06

## 2020-06-09 NOTE — Patient Instructions (Signed)
For history of atrial fibrillation, hypertension and CHF, I want you to continue current medications prescribed by hospital on discharge.  Your blood pressure is a little high today and want you to increase losartan to 100 mg daily.  Went ahead and place referral to cardiologist to establish care.  Placed chest x-ray ordered today along with BNP and a CMP.  Based on this might make adjustment to your Lasix pending your cardiologist appointment.  For possible lung mass on CT done 1 month ago, did go ahead and place CT without contrast order for chest.  This will need to be preauthorized.  Might need to refer to pulmonologist if again they mention possible mass.  Recent sepsis appears to be resolved clinically.  Will follow CBC today.  Known history of gallstones.  Will follow CMP and CBC.  If you get any right upper quadrant pain please let us know.  Might need to repeat ultrasound of abdomen in that event.  After reviewing labs likely place home health referral.  Follow-up in 3 to 4 weeks or as needed.  Please get scale to check weight and get blood pressure cuff to check blood pressure.

## 2020-06-09 NOTE — Progress Notes (Signed)
Subjective:    Patient ID: Carlos Welch, male    DOB: 1946-08-06, 74 y.o.   MRN: [id number]  HPI  Pt in for first time. Moved from [facility] to area. Pt just left January to first of July.   Pt has hx of chf, prostetic valve and atrial fibrillation. Prolonged illlness with a lot of weight loss. He did gain 30 lbs. He has been gaining weight slowly.  Pt also had admission for sepsis back late July.   Recommendations for Outpatient Follow-up:  1. Follow-up with cardiology 2. Follow-up with surgery as needed 3. Repeat CT chest in approximately 4 to 6 weeks and follow-up right lower lung possible mass  Patient discharged to home in Discharge Condition: stable CODE STATUS: Full Diet recommendation:   Hospital Course: Carlos Welch a 74yo male with PMHof CHF, atrial fibrillation on Eliquis, poor outpatient follow-ups, and leftagainst medical advicemultiple times with unknown remaining history,no records on file who presented with several day history of shortness of breath,malaise,fever. The patient reports he originally lives in [place] where he receives his Psychologist, educational, but has recently moved to [place] to live with his daughter. He reports prolonged hospitalization in [facility] for subacute bacterial endocarditis, MRSA bacteremia. His daughter reports to the ED physician that patient had prosthetic valve replacement 6 years ago and then after that he never followed up with his doctors. He has recurrent hospitalization for CHF exacerbation but then he ended up signing against medical advice,Patient reports orthopnea,exertional dyspnea with bilateral leg swelling. He self titrates his Lasix medication. He reports tmax of 102.F at home earlier today. He was admitted from med [facility] for sepsis secondary to community-acquired pneumonia and CHF exacerbation. Cardiology consulted, recommended continue antibiotics, given history of subacute bacterial  endocarditis, MRSA bacteremia patient underwent TEE with normal LV function, no evidence for vegetation or abscess. CT chest shows bilateral loculated effusion with opacity concerning for malignancy, pulmonology consulted recommended longer antibiotics course. He had severe abdominal pain followed by transient shock requiring pressors, he was accepted in the ICU, managed with Levophed. Blood pressure has improved, transferred back to SDU. General surgery consulted for right upper quadrant abdominal pain,HIDA scan was ordered which showed a patent biliary tree.  After further discussion, the patient wished to continue lifestyle modification and diet changes in efforts to avoid cholecystostomy drain and/or surgery.  He was discharged home with an ongoing course of Augmentin to complete.  He was also started on your ursodiol per surgery which was continued to see if any relief with ongoing use at discharge.  Also, prior to discharge his daughter was called and findings of his CT chest were discussed with her.  Patient had an opacity appreciated in the right lung base measuring 2.6 x 1.6 cm.  Etiology was difficult to determine but included atelectasis versus effusion versus mass.  His daughter was instructed to have patient establish care with primary care upon discharge and to pursue further repeat imaging after he recovers from his infections.  She did voice understanding to this and states that that was their first priority after discharge.   Septic shock (HCC) Sepsis present on admission, temp 102.6, respiratory rate 24, lactic acid 4.2,.  Chest x-ray rounded lateral basilar opacity may reflect nodule or rounded atelectasis. Initiated on ceftriaxone and Zithromax for community-acquired pneumonia. Blood cultures: No growth so far, urine culture grew 3K Klebsiella pneumonie Antibiotics changed with Vancomycin and cefepime given history of MRSA and possible valvular abscess. CT chest  shows  bilateral loculated effusion with opacity concerning for malignancy. Pulmonology consulted, helps with critical care management. Pulmonology recommended longer antibiotic course, effusion very small to drain. Continue current antibiotics. MRSA nares negative, will de-escalate antibiotics. IV antibiotics switched to Augmentin to complete 14-day course.  Acute CHF (congestive heart failure) (HCC) POA:BNP 2354, chest x-ray shows cardiomegaly, Appears euvolumic. 2D echocardiogram completed results LVEF 55-60% Cardiology consulted,recommended TEE which ruled out vegetations, valvular abscess. Severe MR, severe TR, Not a candidate for MitraClip because of restricted leaflets.  Atrial fibrillation, chronic (HCC) -Transitioned back to Eliquis at discharge -Coreg continued  Generalized abdominal pain - appears to have symptomatic cholelithiasis - HIDA negative; Ultrasound negative for acute cholecystitis -Discharged with Augmentin to complete course -Outpatient follow-up with surgery if needed for any further recurrent pain  Pressure injury of skin - continue routine turning and nursing care    The patient's chronic medical conditions were treated accordingly per the patient's home medication regimen except as noted.  On day of discharge, patient was felt deemed stable for discharge. Patient/family member advised to call PCP or come back to ER if needed.   Discharge Diagnoses:   Principal Diagnosis: Septic shock (HCC)   Review of Systems  Constitutional: Negative for chills, fatigue and fever.  Respiratory: Negative for cough, chest tightness, shortness of breath and wheezing.   Gastrointestinal: Negative for abdominal pain.  Genitourinary: Negative for dysuria.  Musculoskeletal: Negative for back pain, myalgias, neck pain and neck stiffness.  Skin: Negative for rash.  Neurological: Negative for dizziness, syncope, weakness, numbness and headaches.  Hematological: Negative  for adenopathy.  Psychiatric/Behavioral: Negative for behavioral problems, confusion and suicidal ideas. The patient is not nervous/anxious.     Past Medical History:  Diagnosis Date  . A-fib (HCC)   . Aortic stenosis    a. s/p pericardial AVR 2015.  . Bacterial endocarditis   . CAD (coronary artery disease)    a. s/p CABG, AVR, LAA clipping 2015 at [facility].  . Chronic combined systolic and diastolic CHF (congestive heart failure) (HCC)   . History of noncompliance with medical treatment   . Hypertension   . Malnutrition (HCC)   . MRSA bacteremia   . 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 TIA (transient ischemic attack)   . Unintentional weight loss      Social History   Socioeconomic History  . Marital status: Legally Separated    Spouse name: Not on file  . Number of children: Not on file  . Years of education: Not on file  . Highest education level: Not on file  Occupational History  . Not on file  Tobacco Use  . Smoking status: Former Smoker    Types: Cigarettes  Substance and Sexual Activity  . Alcohol use: No  . Drug use: No  . Sexual activity: Not on file  Other Topics Concern  . Not on file  Social History Narrative  . Not on file   Social Determinants of Health   Financial Resource Strain:   . Difficulty of Paying Living Expenses: Not on file  Food Insecurity:   . Worried About Programme researcher, broadcasting/film/video in the Last Year: Not on file  . Ran Out of Food in the Last Year: Not on file  Transportation Needs:   . Lack of Transportation (Medical): Not on file  . Lack of Transportation (Non-Medical): Not on file  Physical Activity:   . Days of Exercise per Week: Not on file  .  Minutes of Exercise per Session: Not on file  Stress:   . Feeling of Stress : Not on file  Social Connections:   . Frequency of Communication with Friends and Family: Not on file  . Frequency of Social Gatherings with Friends and Family: Not on file  . Attends  Religious Services: Not on file  . Active Member of Clubs or Organizations: Not on file  . Attends BankerClub or Organization Meetings: Not on file  . Marital Status: Not on file  Intimate Partner Violence:   . Fear of Current or Ex-Partner: Not on file  . Emotionally Abused: Not on file  . Physically Abused: Not on file  . Sexually Abused: Not on file    Past Surgical History:  Procedure Laterality Date  . AORTIC VALVE REPLACEMENT    . HERNIA REPAIR    . TEE WITHOUT CARDIOVERSION N/A 05/13/2020   Procedure: TRANSESOPHAGEAL ECHOCARDIOGRAM (TEE);  Surgeon: Lewayne Buntingrenshaw, Brian S, MD;  [facility];  Service: Cardiovascular;  Laterality: N/A;    Family History  Problem Relation Age of Onset  . CAD Neg Hx     No Known Allergies  Current Outpatient Medications on File Prior to Visit  Medication Sig Dispense Refill  . apixaban (ELIQUIS) 5 MG TABS tablet Take 1 tablet (5 mg total) by mouth in the morning and at bedtime. 60 tablet 3  . atorvastatin (LIPITOR) 80 MG tablet Take 1 tablet (80 mg total) by mouth daily. 30 tablet 3  . carvedilol (COREG) 6.25 MG tablet Take 1 tablet (6.25 mg total) by mouth 2 (two) times daily with a meal. 60 tablet 3  . furosemide (LASIX) 40 MG tablet Take 1 tablet (40 mg total) by mouth daily as needed. Take if weight gain of 3 lbs or more overnight 30 tablet 2  . Ginkgo Biloba 40 MG TABS Take 1 tablet by mouth daily.    Marland Kitchen. HEMP OIL-VANILLYL BUTYL ETHER EX Apply 1 application topically daily.    Marland Kitchen. losartan (COZAAR) 50 MG tablet Take 1 tablet (50 mg total) by mouth daily. 30 tablet 3  . Multiple Vitamin (MULTIVITAMIN WITH MINERALS) TABS tablet Take 1 tablet by mouth daily.    . tamsulosin (FLOMAX) 0.4 MG CAPS capsule Take 1 capsule (0.4 mg total) by mouth daily. 30 capsule 3  . TURMERIC PO Take 1 tablet by mouth daily.    . ursodiol (ACTIGALL) 300 MG capsule Take 1 capsule (300 mg total) by mouth 2 (two) times daily. 60 capsule 3   No current  facility-administered medications on file prior to visit.    BP (!) 150/104   Pulse 73   Resp 18   Ht 6\' 2"  (1.88 m)   Wt 169 lb (76.7 kg)   SpO2 99%   BMI 21.70 kg/m       Objective:   Physical Exam  General Mental Status- Alert. General Appearance- Not in acute distress.   Skin General: Color- Normal Color. Moisture- Normal Moisture.  Neck Carotid Arteries- Normal color. Moisture- Normal Moisture. No carotid bruits. No JVD.  Chest and Lung Exam Auscultation: Breath Sounds:-Normal.  Cardiovascular Auscultation:Rythm- Regular. Murmurs & Other Heart Sounds:Auscultation of the heart reveals- No Murmurs.  Abdomen Inspection:-Inspeection Normal. Palpation/Percussion:Note:No mass. Palpation and Percussion of the abdomen reveal- Non Tender, Non Distended + BS, no rebound or guarding.    Neurologic Cranial Nerve exam:- CN III-XII intact(No nystagmus), symmetric smile. Strength:- 5/5 equal and symmetric strength both upper and lower extremities.  Lower ext- symmetric bilateral  pedal edema. 1-2+ pedal edema. Negative homan signs.    Assessment & Plan:  For history of atrial fibrillation, hypertension and CHF, I want you to continue current medications prescribed by hospital on discharge.  Your blood pressure is a little high today and want you to increase losartan to 100 mg daily.  Went ahead and place referral to cardiologist to establish care.  Placed chest x-ray ordered today along with BNP and a CMP.  Based on this might make adjustment to your Lasix pending your cardiologist appointment.  For possible lung mass on CT done 1 month ago, did go ahead and place CT without contrast order for chest.  This will need to be preauthorized.  Might need to refer to pulmonologist if again they mention possible mass.  Recent sepsis appears to be resolved clinically.  Will follow CBC today.  Known history of gallstones.  Will follow CMP and CBC.  If you get any right upper quadrant  pain please let us know.  Might need to repeat ultrasound of abdomen in that event.  After reviewing labs likely place home health referral.  Follow-up in 3 to 4 weeks or as needed.  Please get scale to check weight and get blood pressure cuff to check blood pressure.  [name], PA-C   Time spent with new patient today was 45  minutes which consisted of chart review, discussing multiple med problems, getting hx of prolonged hosptalization in [place], work up, treatment and documentation.

## 2020-06-10 LAB — BRAIN NATRIURETIC PEPTIDE: Brain Natriuretic Peptide: 301 pg/mL — ABNORMAL HIGH (ref <100)

## 2020-06-16 ENCOUNTER — Emergency Department (HOSPITAL_BASED_OUTPATIENT_CLINIC_OR_DEPARTMENT_OTHER)
Admission: EM | Admit: 2020-06-16 | Discharge: 2020-06-16 | Disposition: A | Discharge status: Home / Self Care | Payer: Medicare Other | Attending: Emergency Medicine | Admitting: Emergency Medicine

## 2020-06-16 ENCOUNTER — Encounter (HOSPITAL_BASED_OUTPATIENT_CLINIC_OR_DEPARTMENT_OTHER): Payer: Self-pay | Admitting: *Deleted

## 2020-06-16 ENCOUNTER — Other Ambulatory Visit: Payer: Self-pay

## 2020-06-16 ENCOUNTER — Emergency Department (HOSPITAL_BASED_OUTPATIENT_CLINIC_OR_DEPARTMENT_OTHER): Payer: Medicare Other

## 2020-06-16 DIAGNOSIS — Z87891 Personal history of nicotine dependence: Secondary | ICD-10-CM | POA: Insufficient documentation

## 2020-06-16 DIAGNOSIS — I509 Heart failure, unspecified: Secondary | ICD-10-CM

## 2020-06-16 DIAGNOSIS — Z951 Presence of aortocoronary bypass graft: Secondary | ICD-10-CM | POA: Diagnosis not present

## 2020-06-16 DIAGNOSIS — R0602 Shortness of breath: Secondary | ICD-10-CM | POA: Diagnosis present

## 2020-06-16 DIAGNOSIS — I110 Hypertensive heart disease with heart failure: Secondary | ICD-10-CM | POA: Insufficient documentation

## 2020-06-16 DIAGNOSIS — Z79899 Other long term (current) drug therapy: Secondary | ICD-10-CM | POA: Diagnosis not present

## 2020-06-16 DIAGNOSIS — I2510 Atherosclerotic heart disease of native coronary artery without angina pectoris: Secondary | ICD-10-CM | POA: Diagnosis not present

## 2020-06-16 DIAGNOSIS — I5042 Chronic combined systolic (congestive) and diastolic (congestive) heart failure: Secondary | ICD-10-CM | POA: Diagnosis not present

## 2020-06-16 DIAGNOSIS — Z20822 Contact with and (suspected) exposure to covid-19: Secondary | ICD-10-CM | POA: Diagnosis not present

## 2020-06-16 DIAGNOSIS — R0609 Other forms of dyspnea: Secondary | ICD-10-CM

## 2020-06-16 DIAGNOSIS — R0600 Dyspnea, unspecified: Secondary | ICD-10-CM

## 2020-06-16 LAB — CBC WITH DIFFERENTIAL/PLATELET
Abs Immature Granulocytes: 0.01 10*3/uL (ref 0.00–0.07)
Basophils Absolute: 0.1 10*3/uL (ref 0.0–0.1)
Basophils Relative: 1 %
Eosinophils Absolute: 0.1 10*3/uL (ref 0.0–0.5)
Eosinophils Relative: 2 %
HCT: 37.6 % — ABNORMAL LOW (ref 39.0–52.0)
Hemoglobin: 11 g/dL — ABNORMAL LOW (ref 13.0–17.0)
Immature Granulocytes: 0 %
Lymphocytes Relative: 12 %
Lymphs Abs: 0.6 10*3/uL — ABNORMAL LOW (ref 0.7–4.0)
MCH: 24.3 pg — ABNORMAL LOW (ref 26.0–34.0)
MCHC: 29.3 g/dL — ABNORMAL LOW (ref 30.0–36.0)
MCV: 83 fL (ref 80.0–100.0)
Monocytes Absolute: 0.6 10*3/uL (ref 0.1–1.0)
Monocytes Relative: 11 %
Neutro Abs: 3.7 10*3/uL (ref 1.7–7.7)
Neutrophils Relative %: 74 %
Platelets: 174 10*3/uL (ref 150–400)
RBC: 4.53 MIL/uL (ref 4.22–5.81)
RDW: 23.1 % — ABNORMAL HIGH (ref 11.5–15.5)
Smear Review: NORMAL
WBC: 5.1 10*3/uL (ref 4.0–10.5)
nRBC: 0 % (ref 0.0–0.2)

## 2020-06-16 LAB — COMPREHENSIVE METABOLIC PANEL
ALT: 14 U/L (ref 0–44)
AST: 29 U/L (ref 15–41)
Albumin: 3.7 g/dL (ref 3.5–5.0)
Alkaline Phosphatase: 111 U/L (ref 38–126)
Anion gap: 11 (ref 5–15)
BUN: 25 mg/dL — ABNORMAL HIGH (ref 8–23)
CO2: 24 mmol/L (ref 22–32)
Calcium: 8.9 mg/dL (ref 8.9–10.3)
Chloride: 103 mmol/L (ref 98–111)
Creatinine, Ser: 0.89 mg/dL (ref 0.61–1.24)
GFR calc Af Amer: >60 mL/min (ref >60)
GFR calc non Af Amer: >60 mL/min (ref >60)
Glucose, Bld: 84 mg/dL (ref 70–99)
Potassium: 4.3 mmol/L (ref 3.5–5.1)
Sodium: 138 mmol/L (ref 135–145)
Total Bilirubin: 1.7 mg/dL — ABNORMAL HIGH (ref 0.3–1.2)
Total Protein: 7.5 g/dL (ref 6.5–8.1)

## 2020-06-16 LAB — SARS CORONAVIRUS 2 BY RT PCR (HOSPITAL ORDER, PERFORMED IN ~~LOC~~ HOSPITAL LAB): SARS Coronavirus 2: NEGATIVE

## 2020-06-16 LAB — TROPONIN I (HIGH SENSITIVITY): Troponin I (High Sensitivity): 14 ng/L (ref <18)

## 2020-06-16 LAB — MAGNESIUM: Magnesium: 2.1 mg/dL (ref 1.7–2.4)

## 2020-06-16 LAB — BRAIN NATRIURETIC PEPTIDE: B Natriuretic Peptide: 733 pg/mL — ABNORMAL HIGH (ref 0.0–100.0)

## 2020-06-16 MED ORDER — FUROSEMIDE 10 MG/ML IJ SOLN
40.0000 mg | Freq: Once | INTRAMUSCULAR | Status: AC
  Administered 2020-06-16: 40 mg via INTRAVENOUS
  Filled 2020-06-16: qty 4

## 2020-06-16 NOTE — ED Notes (Signed)
Ambulated in room on r/a with walker, SpO2 95-100%, Hr 85-88. Denies DOE.

## 2020-06-16 NOTE — ED Provider Notes (Signed)
[facility] EMERGENCY DEPARTMENT Provider Note   CSN: [id number] Arrival date & time: 06/16/20  1245     History Chief Complaint  Patient presents with  . Shortness of Breath    Carlos Welch is a 74 y.o. male.  He has a history of A. fib on anticoagulation, CABG CHF.  He is complaining of 3 days of shortness of breath dyspnea on exertion generalized fatigue generalized weakness.  Does not check his daily weights.  Feels his legs are little more swollen.  No chest pain.  No fevers chills cough.  No vomiting or diarrhea.  No urinary symptoms.  Some vague abdominal pain cannot tell me how long its been going on.  The history is provided by the patient.  Shortness of Breath Severity:  Moderate Onset quality:  Gradual Timing:  Constant Progression:  Unchanged Chronicity:  New Relieved by:  Nothing Worsened by:  Activity Ineffective treatments:  Rest Associated symptoms: abdominal pain   Associated symptoms: no chest pain, no cough, no fever, no headaches, no hemoptysis, no rash, no sore throat, no sputum production and no vomiting        Past Medical History:  Diagnosis Date  . A-fib (HCC)   . Aortic stenosis    a. s/p pericardial AVR 2015.  . Bacterial endocarditis   . CAD (coronary artery disease)    a. [facility]  . Chronic combined systolic and diastolic CHF (congestive heart failure) (HCC)   . History of noncompliance with medical treatment   . Hypertension   . Malnutrition (HCC)   . MRSA bacteremia   . 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 TIA (transient ischemic attack)   . Unintentional weight loss     Patient Active Problem List   Diagnosis Date Noted  . Pressure injury of skin 05/12/2020  . Generalized abdominal pain   . Atrial fibrillation, chronic (HCC) 05/11/2020    Past Surgical History:  Procedure Laterality Date  . AORTIC VALVE REPLACEMENT    . HERNIA REPAIR    . TEE WITHOUT  CARDIOVERSION N/A 05/13/2020   Procedure: TRANSESOPHAGEAL ECHOCARDIOGRAM (TEE);  Surgeon: Lewayne Buntingrenshaw, Brian S, MD;  [facility];  Service: Cardiovascular;  Laterality: N/A;       Family History  Problem Relation Age of Onset  . CAD Neg Hx     Social History   Tobacco Use  . Smoking status: Former Smoker    Types: Cigarettes  Substance Use Topics  . Alcohol use: No  . Drug use: No    Home Medications Prior to Admission medications   Medication Sig Start Date End Date Taking? Authorizing Provider  apixaban (ELIQUIS) 5 MG TABS tablet Take 1 tablet (5 mg total) by mouth in the morning and at bedtime. 05/19/20   Lewie ChamberGirguis, David, MD  atorvastatin (LIPITOR) 80 MG tablet Take 1 tablet (80 mg total) by mouth daily. 05/19/20   Lewie ChamberGirguis, David, MD  carvedilol (COREG) 6.25 MG tablet Take 1 tablet (6.25 mg total) by mouth 2 (two) times daily with a meal. 05/19/20   Lewie ChamberGirguis, David, MD  furosemide (LASIX) 40 MG tablet Take 1 tablet (40 mg total) by mouth daily as needed. Take if weight gain of 3 lbs or more overnight 05/19/20   Lewie ChamberGirguis, David, MD  Ginkgo Biloba 40 MG TABS Take 1 tablet by mouth daily.    [provider]  HEMP OIL-VANILLYL BUTYL ETHER EX Apply 1 application topically daily.  [provider]  losartan (COZAAR) 100 MG tablet Take 1 tablet (100 mg total) by mouth daily. 06/09/20   Saguier, [name], PA-C  losartan (COZAAR) 50 MG tablet Take 1 tablet (50 mg total) by mouth daily. 05/20/20   Lewie Chamber, MD  Multiple Vitamin (MULTIVITAMIN WITH MINERALS) TABS tablet Take 1 tablet by mouth daily.    [provider]  tamsulosin (FLOMAX) 0.4 MG CAPS capsule Take 1 capsule (0.4 mg total) by mouth daily. 05/19/20   Lewie Chamber, MD  TURMERIC PO Take 1 tablet by mouth daily.    [provider]  ursodiol (ACTIGALL) 300 MG capsule Take 1 capsule (300 mg total) by mouth 2 (two) times daily. 05/19/20   Lewie Chamber, MD    Allergies    Patient has no known  allergies.  Review of Systems   Review of Systems  Constitutional: Negative for fever.  HENT: Negative for sore throat.   Eyes: Negative for visual disturbance.  Respiratory: Positive for shortness of breath. Negative for cough, hemoptysis and sputum production.   Cardiovascular: Positive for leg swelling. Negative for chest pain.  Gastrointestinal: Positive for abdominal pain. Negative for diarrhea and vomiting.  Genitourinary: Negative for dysuria.  Musculoskeletal: Positive for gait problem.  Skin: Negative for rash.  Neurological: Negative for headaches.    Physical Exam Updated Vital Signs BP (!) 154/117   Pulse 73   Temp 97.7 F (36.5 C) (Oral)   Resp 20   Ht 6\' 2"  (1.88 m)   Wt 74.4 kg   SpO2 100%   BMI 21.06 kg/m   Physical Exam Vitals and nursing note reviewed.  Constitutional:      General: He is not in acute distress.    Appearance: Normal appearance. He is well-developed.  HENT:     Head: Normocephalic and atraumatic.  Eyes:     Conjunctiva/sclera: Conjunctivae normal.  Cardiovascular:     Rate and Rhythm: Normal rate. Rhythm irregular.     Pulses: Normal pulses.     Heart sounds: No murmur heard.   Pulmonary:     Effort: Pulmonary effort is normal. No respiratory distress.     Breath sounds: Normal breath sounds.  Abdominal:     Palpations: Abdomen is soft.     Tenderness: There is no abdominal tenderness. There is no guarding or rebound.  Musculoskeletal:        General: No tenderness.     Cervical back: Neck supple.     Right lower leg: Edema present.     Left lower leg: Edema present.  Skin:    General: Skin is warm and dry.     Capillary Refill: Capillary refill takes less than 2 seconds.  Neurological:     General: No focal deficit present.     Mental Status: He is alert.     ED Results / Procedures / Treatments   Labs (all labs ordered are listed, but only abnormal results are displayed) Labs Reviewed  CBC WITH  DIFFERENTIAL/PLATELET - Abnormal; Notable for the following components:      Result Value   Hemoglobin 11.0 (*)    HCT 37.6 (*)    MCH 24.3 (*)    MCHC 29.3 (*)    RDW 23.1 (*)    Lymphs Abs 0.6 (*)    All other components within normal limits  COMPREHENSIVE METABOLIC PANEL - Abnormal; Notable for the following components:   BUN 25 (*)    Total Bilirubin 1.7 (*)    All  other components within normal limits  BRAIN NATRIURETIC PEPTIDE - Abnormal; Notable for the following components:   B Natriuretic Peptide 733.0 (*)    All other components within normal limits  SARS CORONAVIRUS 2 BY RT PCR (HOSPITAL ORDER, PERFORMED IN [facility])  MAGNESIUM  TROPONIN I (HIGH SENSITIVITY)  TROPONIN I (HIGH SENSITIVITY)    EKG EKG Interpretation  Date/Time:  Wednesday June 16 2020 15:15:53 EDT Ventricular Rate:  77 PR Interval:    QRS Duration: 102 QT Interval:  405 QTC Calculation: 459 R Axis:   86 Text Interpretation: Atrial fibrillation Borderline right axis deviation Borderline repolarization abnormality No significant change since prior 7/21 Confirmed by Meridee Score [phone]) on 06/16/2020 4:05:14 PM   Radiology DG Chest Portable 1 View  Result Date: 06/16/2020 CLINICAL DATA:  Shortness of breath EXAM: PORTABLE CHEST 1 VIEW COMPARISON:  06/09/2020 FINDINGS: Post CABG changes with cardiac valve replacement and left atrial appendage clipping. Stable cardiomegaly. Moderate right pleural effusion which appears slightly increased from prior. Probable trace left pleural effusion. Diffuse interstitial prominence bilaterally suggesting edema. No pneumothorax. IMPRESSION: 1. Mild interstitial edema with moderate right pleural effusion which appears slightly increased from prior. 2. Probable trace left pleural effusion. Electronically Signed   By: Duanne Guess D.O.   On: 06/16/2020 14:01    Procedures Procedures (including critical care time)  Medications Ordered in  ED Medications  furosemide (LASIX) injection 40 mg (has no administration in time range)    ED Course  I have reviewed the triage vital signs and the nursing notes.  Pertinent labs & imaging results that were available during my care of the patient were reviewed by me and considered in my medical decision making (see chart for details).  Clinical Course as of Jun 16 2324  Wed Jun 16, 2020  1615 Patient given 40 of IV Lasix here and diuresed 850 cc.  Ambulated in the department and sats remained 98%.   [MB]  1629 Patient says he feels better.  He is already prescribed furosemide to use as needed for when he has fluid gain.  It does not sound like he uses it all the time.  I recommended that he take it for the next 3 days in the morning.  Follow-up with his doctor.  Return instructions discussed.   [MB]    Clinical Course User Index [MB] Terrilee Files, MD   MDM Rules/Calculators/A&P                         This patient complains of increased shortness of breath and malaise; this involves an extensive number of treatment Options and is a complaint that carries with it a high risk of complications and Morbidity. The differential includes COPD, CHF, pneumonia, Covid, anemia, metabolic derangement  I ordered, reviewed and interpreted labs, which included CBC with normal white count, hemoglobin low but better than baseline, chemistries unremarkable, troponin not elevated, BNP elevated, Covid testing negative I ordered medication IV Lasix with good diuresis I ordered imaging studies which included chest x-ray and I independently    visualized and interpreted imaging which showed interstitial edema and pleural effusion Previous records obtained and reviewed in epic, just saw his primary care doctor last week and has a cardiology referral  After the interventions stated above, I reevaluated the patient and found patient to be breathing more comfortably.  He is asking for discharge and I  think this is reasonable.  He will follow  up with his primary care doctor and return instructions discussed.  It does not sound like he has been taking his furosemide as prescribed as needed and asked him to take it for the next few days.   Final Clinical Impression(s) / ED Diagnoses Final diagnoses:  DOE (dyspnea on exertion)  Acute on chronic congestive heart failure, unspecified heart failure type [facility])    Rx / DC Orders ED Discharge Orders    None       Terrilee Files, MD 06/16/20 2327

## 2020-06-16 NOTE — ED Triage Notes (Signed)
C/o SOB , nausea and generalized fatigue x 3 days

## 2020-06-16 NOTE — Discharge Instructions (Addendum)
You were seen in the emergency department for 3 days of increased shortness of breath.  You had blood work chest x-ray and an EKG.  Your symptoms are probably related to congestive heart failure.  You were given some medication to help you urinate off some extra fluid in the department with improvement in your symptoms.  Please take your furosemide daily for the next 3 days.  Call your doctor for close follow-up.  Follow your daily weights.  Return to the emergency department if any worsening or concerning symptoms.

## 2020-06-27 ENCOUNTER — Emergency Department (HOSPITAL_BASED_OUTPATIENT_CLINIC_OR_DEPARTMENT_OTHER): Payer: Medicare Other

## 2020-06-27 ENCOUNTER — Emergency Department (HOSPITAL_BASED_OUTPATIENT_CLINIC_OR_DEPARTMENT_OTHER)
Admission: EM | Admit: 2020-06-27 | Discharge: 2020-06-27 | Disposition: A | Discharge status: Home / Self Care | Payer: Medicare Other | Attending: Emergency Medicine | Admitting: Emergency Medicine

## 2020-06-27 ENCOUNTER — Encounter (HOSPITAL_BASED_OUTPATIENT_CLINIC_OR_DEPARTMENT_OTHER): Payer: Self-pay | Admitting: Emergency Medicine

## 2020-06-27 ENCOUNTER — Other Ambulatory Visit: Payer: Self-pay

## 2020-06-27 DIAGNOSIS — I2510 Atherosclerotic heart disease of native coronary artery without angina pectoris: Secondary | ICD-10-CM | POA: Diagnosis not present

## 2020-06-27 DIAGNOSIS — I509 Heart failure, unspecified: Secondary | ICD-10-CM

## 2020-06-27 DIAGNOSIS — I10 Essential (primary) hypertension: Secondary | ICD-10-CM | POA: Diagnosis not present

## 2020-06-27 DIAGNOSIS — Z87891 Personal history of nicotine dependence: Secondary | ICD-10-CM | POA: Insufficient documentation

## 2020-06-27 DIAGNOSIS — I5042 Chronic combined systolic (congestive) and diastolic (congestive) heart failure: Secondary | ICD-10-CM | POA: Diagnosis not present

## 2020-06-27 DIAGNOSIS — Z79899 Other long term (current) drug therapy: Secondary | ICD-10-CM | POA: Insufficient documentation

## 2020-06-27 DIAGNOSIS — R600 Localized edema: Secondary | ICD-10-CM | POA: Insufficient documentation

## 2020-06-27 DIAGNOSIS — Z952 Presence of prosthetic heart valve: Secondary | ICD-10-CM | POA: Diagnosis not present

## 2020-06-27 DIAGNOSIS — R0602 Shortness of breath: Secondary | ICD-10-CM | POA: Diagnosis present

## 2020-06-27 DIAGNOSIS — Z7901 Long term (current) use of anticoagulants: Secondary | ICD-10-CM | POA: Insufficient documentation

## 2020-06-27 DIAGNOSIS — Z20822 Contact with and (suspected) exposure to covid-19: Secondary | ICD-10-CM | POA: Insufficient documentation

## 2020-06-27 LAB — CBC
HCT: 38.1 % — ABNORMAL LOW (ref 39.0–52.0)
Hemoglobin: 11.1 g/dL — ABNORMAL LOW (ref 13.0–17.0)
MCH: 24.7 pg — ABNORMAL LOW (ref 26.0–34.0)
MCHC: 29.1 g/dL — ABNORMAL LOW (ref 30.0–36.0)
MCV: 84.7 fL (ref 80.0–100.0)
Platelets: 152 10*3/uL (ref 150–400)
RBC: 4.5 MIL/uL (ref 4.22–5.81)
RDW: 22.5 % — ABNORMAL HIGH (ref 11.5–15.5)
WBC: 3.9 10*3/uL — ABNORMAL LOW (ref 4.0–10.5)
nRBC: 0 % (ref 0.0–0.2)

## 2020-06-27 LAB — BASIC METABOLIC PANEL
Anion gap: 10 (ref 5–15)
BUN: 27 mg/dL — ABNORMAL HIGH (ref 8–23)
CO2: 22 mmol/L (ref 22–32)
Calcium: 9 mg/dL (ref 8.9–10.3)
Chloride: 105 mmol/L (ref 98–111)
Creatinine, Ser: 0.84 mg/dL (ref 0.61–1.24)
GFR calc Af Amer: >60 mL/min (ref >60)
GFR calc non Af Amer: >60 mL/min (ref >60)
Glucose, Bld: 128 mg/dL — ABNORMAL HIGH (ref 70–99)
Potassium: 4 mmol/L (ref 3.5–5.1)
Sodium: 137 mmol/L (ref 135–145)

## 2020-06-27 LAB — HEPATIC FUNCTION PANEL
ALT: 14 U/L (ref 0–44)
AST: 24 U/L (ref 15–41)
Albumin: 3.5 g/dL (ref 3.5–5.0)
Alkaline Phosphatase: 99 U/L (ref 38–126)
Bilirubin, Direct: 0.2 mg/dL (ref 0.0–0.2)
Indirect Bilirubin: 1 mg/dL — ABNORMAL HIGH (ref 0.3–0.9)
Total Bilirubin: 1.2 mg/dL (ref 0.3–1.2)
Total Protein: 7.3 g/dL (ref 6.5–8.1)

## 2020-06-27 LAB — TROPONIN I (HIGH SENSITIVITY): Troponin I (High Sensitivity): 13 ng/L (ref <18)

## 2020-06-27 LAB — SARS CORONAVIRUS 2 BY RT PCR (HOSPITAL ORDER, PERFORMED IN ~~LOC~~ HOSPITAL LAB): SARS Coronavirus 2: NEGATIVE

## 2020-06-27 LAB — BRAIN NATRIURETIC PEPTIDE: B Natriuretic Peptide: 684 pg/mL — ABNORMAL HIGH (ref 0.0–100.0)

## 2020-06-27 LAB — APTT: aPTT: 41 seconds — ABNORMAL HIGH (ref 24–36)

## 2020-06-27 MED ORDER — FUROSEMIDE 40 MG PO TABS: 40.0000 mg | ORAL_TABLET | Freq: Every day | ORAL | 1 refills | Status: DC | PRN

## 2020-06-27 MED ORDER — FUROSEMIDE 10 MG/ML IJ SOLN
40.0000 mg | Freq: Once | INTRAMUSCULAR | Status: AC
  Administered 2020-06-27: 40 mg via INTRAVENOUS
  Filled 2020-06-27: qty 4

## 2020-06-27 NOTE — ED Provider Notes (Signed)
[facility] EMERGENCY DEPARTMENT Provider Note   CSN: [id number] Arrival date & time: 06/27/20  1259     History Chief Complaint  Patient presents with  . Shortness of Breath    Carlos PenmanDanny Welch is a 74 y.o. male.  HPI Patient is a 74 year old male with a history of atrial fibrillation on Eliquis, bacterial endocarditis, CAD, chronic combined systolic and diastolic CHF, hypertension, status post aortic valve replacement, TIA.  Patient states that he was evaluated for similar symptoms about 11 days ago in the emergency department.  He was given Lasix and states that his symptoms resolved and he then went home.  Patient states that about 1 week ago he began noticing mild worsening swelling in the bilateral lower extremities.  Additionally notes shortness of breath that is worse when lying flat, this started about 3 days ago.  Reports 2-3 pillow orthopnea.  Pt states that he took 20 mg of lasix this AM but denies that he regularly "takes it like he should".  No chest pain, fevers, chills, nausea, vomiting, diarrhea, urinary changes, syncope.    Past Medical History:  Diagnosis Date  . A-fib (HCC)   . Aortic stenosis    a. s/p pericardial AVR 2015.  . Bacterial endocarditis   . CAD (coronary artery disease)    a. s/[facility] CABG, [facility]  . Chronic combined systolic and diastolic CHF (congestive heart failure) (HCC)   . History of noncompliance with medical treatment   . Hypertension   . Malnutrition (HCC)   . MRSA bacteremia   . 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 TIA (transient ischemic attack)   . Unintentional weight loss     Patient Active Problem List   Diagnosis Date Noted  . Pressure injury of skin 05/12/2020  . Generalized abdominal pain   . Atrial fibrillation, chronic (HCC) 05/11/2020    Past Surgical History:  Procedure Laterality Date  . AORTIC VALVE REPLACEMENT    . HERNIA REPAIR    . TEE WITHOUT  CARDIOVERSION N/A 05/13/2020   Procedure: TRANSESOPHAGEAL ECHOCARDIOGRAM (TEE);  Surgeon: Lewayne Buntingrenshaw, Brian S, MD;  [facility];  Service: Cardiovascular;  Laterality: N/A;       Family History  Problem Relation Age of Onset  . CAD Neg Hx     Social History   Tobacco Use  . Smoking status: Former Smoker    Types: Cigarettes  Substance Use Topics  . Alcohol use: No  . Drug use: No    Home Medications Prior to Admission medications   Medication Sig Start Date End Date Taking? Authorizing Provider  apixaban (ELIQUIS) 5 MG TABS tablet Take 1 tablet (5 mg total) by mouth in the morning and at bedtime. 05/19/20   Lewie ChamberGirguis, David, MD  atorvastatin (LIPITOR) 80 MG tablet Take 1 tablet (80 mg total) by mouth daily. 05/19/20   Lewie ChamberGirguis, David, MD  carvedilol (COREG) 6.25 MG tablet Take 1 tablet (6.25 mg total) by mouth 2 (two) times daily with a meal. 05/19/20   Lewie ChamberGirguis, David, MD  furosemide (LASIX) 40 MG tablet Take 1 tablet (40 mg total) by mouth daily as needed. Take if weight gain of 3 lbs or more overnight 05/19/20   Lewie ChamberGirguis, David, MD  Ginkgo Biloba 40 MG TABS Take 1 tablet by mouth daily.    [provider]  HEMP OIL-VANILLYL BUTYL ETHER EX Apply 1 application topically daily.    [provider]  losartan (COZAAR) 100  MG tablet Take 1 tablet (100 mg total) by mouth daily. 06/09/20   Saguier, [name], PA-C  losartan (COZAAR) 50 MG tablet Take 1 tablet (50 mg total) by mouth daily. 05/20/20   Lewie Chamber, MD  Multiple Vitamin (MULTIVITAMIN WITH MINERALS) TABS tablet Take 1 tablet by mouth daily.    [provider]  tamsulosin (FLOMAX) 0.4 MG CAPS capsule Take 1 capsule (0.4 mg total) by mouth daily. 05/19/20   Lewie Chamber, MD  TURMERIC PO Take 1 tablet by mouth daily.    [provider]  ursodiol (ACTIGALL) 300 MG capsule Take 1 capsule (300 mg total) by mouth 2 (two) times daily. 05/19/20   Lewie Chamber, MD    Allergies    Patient has no known  allergies.  Review of Systems   Review of Systems  All other systems reviewed and are negative. Ten systems reviewed and are negative for acute change, except as noted in the HPI.    Physical Exam Updated Vital Signs BP 137/82 (BP Location: Left Arm)   Pulse 71   Temp 97.8 F (36.6 C) (Oral)   Resp 20   Wt 79.4 kg   SpO2 100%   BMI 22.47 kg/m   Physical Exam Vitals and nursing note reviewed.  Constitutional:      General: He is in acute distress.     Appearance: Normal appearance. He is well-developed and normal weight. He is not ill-appearing, toxic-appearing or diaphoretic.     Comments: Well developed but cachectic elderly male. Speaks in clear and coherent sentences.   HENT:     Head: Normocephalic and atraumatic.     Right Ear: External ear normal.     Left Ear: External ear normal.     Nose: Nose normal.     Mouth/Throat:     Mouth: Mucous membranes are moist.     Pharynx: Oropharynx is clear. No oropharyngeal exudate or posterior oropharyngeal erythema.  Eyes:     Extraocular Movements: Extraocular movements intact.     Pupils: Pupils are equal, round, and reactive to light.  Cardiovascular:     Rate and Rhythm: Normal rate and regular rhythm.     Pulses: Normal pulses.     Heart sounds: Normal heart sounds. No murmur heard.  No friction rub. No gallop.      Comments: RRR. No M/R/G.  Pulmonary:     Effort: Pulmonary effort is normal. No tachypnea, bradypnea, accessory muscle usage or respiratory distress.     Breath sounds: No stridor. Examination of the right-lower field reveals rales. Examination of the left-lower field reveals rales. Rales present. No decreased breath sounds, wheezing or rhonchi.     Comments: Faint crackles in the bilateral lung bases. No wheezing or rhonchi. O2 saturations around 98-100% on RA while standing and conversing with me.  Chest:     Chest wall: No tenderness.  Abdominal:     General: Abdomen is flat.     Tenderness: There is no  abdominal tenderness.  Musculoskeletal:        General: Normal range of motion.     Cervical back: Normal range of motion and neck supple. No tenderness.     Right lower leg: Tenderness present. Edema present.     Left lower leg: Tenderness present. Edema present.     Comments: 1+ pitting edema in the BLEs. Diffuse circumferential erythema consistent with stasis dermatitis.   Skin:    General: Skin is warm and dry.  Neurological:  General: No focal deficit present.     Mental Status: He is alert and oriented to person, place, and time.  Psychiatric:        Mood and Affect: Mood normal.        Behavior: Behavior normal.    ED Results / Procedures / Treatments   Labs (all labs ordered are listed, but only abnormal results are displayed) Labs Reviewed  BASIC METABOLIC PANEL - Abnormal; Notable for the following components:      Result Value   Glucose, Bld 128 (*)    BUN 27 (*)    All other components within normal limits  CBC - Abnormal; Notable for the following components:   WBC 3.9 (*)    Hemoglobin 11.1 (*)    HCT 38.1 (*)    MCH 24.7 (*)    MCHC 29.1 (*)    RDW 22.5 (*)    All other components within normal limits  BRAIN NATRIURETIC PEPTIDE - Abnormal; Notable for the following components:   B Natriuretic Peptide 684.0 (*)    All other components within normal limits  APTT - Abnormal; Notable for the following components:   aPTT 41 (*)    All other components within normal limits  HEPATIC FUNCTION PANEL - Abnormal; Notable for the following components:   Indirect Bilirubin 1.0 (*)    All other components within normal limits  SARS CORONAVIRUS 2 BY RT PCR [facility] ORDER, PERFORMED IN [facility] LAB)  TROPONIN I (HIGH SENSITIVITY)   EKG EKG Interpretation  Date/Time:  Sunday June 27 2020 13:23:44 EDT Ventricular Rate:  68 PR Interval:    QRS Duration: 102 QT Interval:  424 QTC Calculation: 450 R Axis:   84 Text Interpretation: Atrial  fibrillation Abnormal ECG No significant change since last tracing Confirmed by Richardean Canal [phone]) on 06/27/2020 5:29:56 PM  Radiology DG Chest 2 View  Result Date: 06/27/2020 CLINICAL DATA:  Shortness of breath EXAM: CHEST - 2 VIEW COMPARISON:  June 16, 2020. FINDINGS: There are persistent pleural effusions bilaterally, larger on the right than on the left. Right pleural effusion appears partially loculated. There is scarring in the right mid lung region. There is atelectatic change in the right base. There is no consolidation. There is cardiomegaly with pulmonary vascularity normal. Patient is status post coronary artery bypass grafting. There is an aortic valve replacement with a left atrial appendage clamp. No adenopathy. No bone lesions. IMPRESSION: Persistent partially loculated pleural effusion right base. Much smaller left pleural effusion. Atelectasis right base. Mild scarring right mid lung. No frank edema or consolidation evident. Stable cardiac silhouette. Postoperative changes noted. No adenopathy evident. Electronically Signed   By: Bretta Bang III M.D.   On: 06/27/2020 15:10    Procedures Procedures   Medications Ordered in ED Medications  furosemide (LASIX) injection 40 mg (has no administration in time range)   ED Course  I have reviewed the triage vital signs and the nursing notes.  Pertinent labs & imaging results that were available during my care of the patient were reviewed by me and considered in my medical decision making (see chart for details).  Clinical Course as of Jun 28 1831  Wynelle Link Jun 27, 2020  1454 Mildly uremic. Consistent with prior value 11 days ago.   BUN(!): 27 [LJ]  1454 BUN(!): 27 [LJ]  1455 WBC(!): 3.9 [LJ]  1455 Consistent with prior values.  Hemoglobin(!): 11.1 [LJ]  1456 Slightly improved from prior visit, 11 days ago.  B Natriuretic Peptide(!): 684.0 [LJ]  1717 Troponin I (High Sensitivity): 13 [LJ]  1718 Patient reassessed.  States  he is starting to feel much better status post diuresis.  Initial troponin is not elevated.  COVID-19 test in process.  We will continue to monitor.   [LJ]  1748 SARS Coronavirus 2: NEGATIVE [LJ]    Clinical Course User Index [LJ] Placido Sou, PA-C   MDM Rules/Calculators/A&P                          Pt is a 74 y.o. male that presents with a history, physical exam, and ED Clinical Course as noted above.   Patient presents today with shortness of breath/orthopnea.  Patient has a history of CHF.  Poor compliance with Lasix.  Patient was given 40 mg of IV Lasix here in the ER.  Patient states that he is starting to feel much better.  No notable hypoxia since arrival.  Patient was ambulated and was noted to have oxygen saturations of 100%.  Not tachycardic.  Chest x-ray does show some persistent partially loculated pleural effusion at the right base with a much smaller left pleural effusion.  Atelectasis at the right base.  Mild scarring of the right midlung.  No frank edema or consolidation evident.  Lab work today is generally reassuring.  BNP is elevated at 684.  Consistent with prior values.  Mild anemia at 11.1, also consistent with prior values.  No elevation in troponin x2.  His COVID-19 test is negative.  Recommended that patient begin taking his Lasix daily.  He states that he might need a new prescription, so I will provide this for him.  Patient is hemodynamically stable and in NAD at the time of d/c. Evaluation does not show pathology that would require ongoing emergent intervention or inpatient treatment. I explained the diagnosis to the patient. Patient is comfortable with above plan and is stable for discharge at this time. All questions were answered prior to disposition. Strict return precautions for returning to the ED were discussed. Encouraged follow up with PCP.    An After Visit Summary was printed and given to the patient.  Patient discharged to home/self  care.  Condition at discharge: Stable  Note: Portions of this report may have been transcribed using voice recognition software. Every effort was made to ensure accuracy; however, inadvertent computerized transcription errors may be present.   Final Clinical Impression(s) / ED Diagnoses Final diagnoses:  Acute on chronic congestive heart failure, unspecified heart failure type [facility])   Rx / DC Orders ED Discharge Orders         Ordered    furosemide (LASIX) 40 MG tablet  Daily PRN        06/27/20 1839           Placido Sou, PA-C 06/27/20 1842    Charlynne Pander, MD 06/28/20 [phone]

## 2020-06-27 NOTE — ED Triage Notes (Signed)
Patient states that he has had shortness of breath, and increased fluid with a 10lb weight gain over a week. The patient also has states that he gets very SOB with chest pain walking around.

## 2020-06-27 NOTE — ED Notes (Signed)
Ambulated in room. Remained 100% RA, no tachypnea. Tolerated well.

## 2020-07-01 ENCOUNTER — Other Ambulatory Visit: Payer: Self-pay

## 2020-07-01 ENCOUNTER — Emergency Department (HOSPITAL_BASED_OUTPATIENT_CLINIC_OR_DEPARTMENT_OTHER): Payer: Medicare Other

## 2020-07-01 ENCOUNTER — Emergency Department (HOSPITAL_BASED_OUTPATIENT_CLINIC_OR_DEPARTMENT_OTHER)
Admission: EM | Admit: 2020-07-01 | Discharge: 2020-07-01 | Disposition: A | Discharge status: Home / Self Care | Payer: Medicare Other | Attending: Emergency Medicine | Admitting: Emergency Medicine

## 2020-07-01 ENCOUNTER — Encounter (HOSPITAL_BASED_OUTPATIENT_CLINIC_OR_DEPARTMENT_OTHER): Payer: Self-pay | Admitting: *Deleted

## 2020-07-01 DIAGNOSIS — R42 Dizziness and giddiness: Secondary | ICD-10-CM | POA: Diagnosis not present

## 2020-07-01 DIAGNOSIS — Z5321 Procedure and treatment not carried out due to patient leaving prior to being seen by health care provider: Secondary | ICD-10-CM | POA: Insufficient documentation

## 2020-07-01 DIAGNOSIS — R0602 Shortness of breath: Secondary | ICD-10-CM | POA: Insufficient documentation

## 2020-07-01 LAB — CBC
HCT: 40.4 % (ref 39.0–52.0)
Hemoglobin: 11.7 g/dL — ABNORMAL LOW (ref 13.0–17.0)
MCH: 24.5 pg — ABNORMAL LOW (ref 26.0–34.0)
MCHC: 29 g/dL — ABNORMAL LOW (ref 30.0–36.0)
MCV: 84.5 fL (ref 80.0–100.0)
Platelets: 166 10*3/uL (ref 150–400)
RBC: 4.78 MIL/uL (ref 4.22–5.81)
RDW: 22 % — ABNORMAL HIGH (ref 11.5–15.5)
WBC: 3.8 10*3/uL — ABNORMAL LOW (ref 4.0–10.5)
nRBC: 0 % (ref 0.0–0.2)

## 2020-07-01 LAB — BASIC METABOLIC PANEL
Anion gap: 8 (ref 5–15)
BUN: 25 mg/dL — ABNORMAL HIGH (ref 8–23)
CO2: 25 mmol/L (ref 22–32)
Calcium: 9.2 mg/dL (ref 8.9–10.3)
Chloride: 105 mmol/L (ref 98–111)
Creatinine, Ser: 0.99 mg/dL (ref 0.61–1.24)
GFR calc Af Amer: >60 mL/min (ref >60)
GFR calc non Af Amer: >60 mL/min (ref >60)
Glucose, Bld: 94 mg/dL (ref 70–99)
Potassium: 4.5 mmol/L (ref 3.5–5.1)
Sodium: 138 mmol/L (ref 135–145)

## 2020-07-01 NOTE — ED Triage Notes (Signed)
Sob and dizziness all day. States he got his first Covid shot 11 days ago.

## 2020-07-01 NOTE — ED Notes (Addendum)
Pt called family to lobby, stated he was ready to go home. Informed pt we would have a room available for him shortly. Pt states he wanted to go- states "this is the same old thing. i'll go home and take my medicine". Family present also encouraging pt to stay. Pt declined. States he will come back if needed and will f/u with his PCP. Able to stand from wheelchair using his own cane and get into vehicle.

## 2020-07-01 NOTE — ED Triage Notes (Signed)
[facility]: Pt assisted from car to wheelchair. Able to stand with minimal assist. C/O feeling dizzy and SOB. O2 sats 98% on ra, HR76

## 2020-07-05 ENCOUNTER — Other Ambulatory Visit: Payer: Self-pay

## 2020-07-05 ENCOUNTER — Encounter (HOSPITAL_COMMUNITY): Payer: Self-pay | Admitting: *Deleted

## 2020-07-05 ENCOUNTER — Emergency Department (HOSPITAL_COMMUNITY): Payer: Medicare Other

## 2020-07-05 ENCOUNTER — Emergency Department (HOSPITAL_COMMUNITY)
Admission: EM | Admit: 2020-07-05 | Discharge: 2020-07-05 | Disposition: A | Discharge status: Home / Self Care | Payer: Medicare Other | Attending: Emergency Medicine | Admitting: Emergency Medicine

## 2020-07-05 DIAGNOSIS — M791 Myalgia, unspecified site: Secondary | ICD-10-CM | POA: Diagnosis present

## 2020-07-05 DIAGNOSIS — Z5321 Procedure and treatment not carried out due to patient leaving prior to being seen by health care provider: Secondary | ICD-10-CM | POA: Insufficient documentation

## 2020-07-05 DIAGNOSIS — R42 Dizziness and giddiness: Secondary | ICD-10-CM | POA: Diagnosis not present

## 2020-07-05 LAB — BASIC METABOLIC PANEL
Anion gap: 9 (ref 5–15)
BUN: 25 mg/dL — ABNORMAL HIGH (ref 8–23)
CO2: 23 mmol/L (ref 22–32)
Calcium: 9.1 mg/dL (ref 8.9–10.3)
Chloride: 106 mmol/L (ref 98–111)
Creatinine, Ser: 1.28 mg/dL — ABNORMAL HIGH (ref 0.61–1.24)
GFR calc Af Amer: >60 mL/min (ref >60)
GFR calc non Af Amer: 56 mL/min — ABNORMAL LOW (ref >60)
Glucose, Bld: 103 mg/dL — ABNORMAL HIGH (ref 70–99)
Potassium: 4.5 mmol/L (ref 3.5–5.1)
Sodium: 138 mmol/L (ref 135–145)

## 2020-07-05 LAB — CBC
HCT: 37.6 % — ABNORMAL LOW (ref 39.0–52.0)
Hemoglobin: 10.8 g/dL — ABNORMAL LOW (ref 13.0–17.0)
MCH: 24.7 pg — ABNORMAL LOW (ref 26.0–34.0)
MCHC: 28.7 g/dL — ABNORMAL LOW (ref 30.0–36.0)
MCV: 86 fL (ref 80.0–100.0)
Platelets: 146 10*3/uL — ABNORMAL LOW (ref 150–400)
RBC: 4.37 MIL/uL (ref 4.22–5.81)
RDW: 21.2 % — ABNORMAL HIGH (ref 11.5–15.5)
WBC: 3.2 10*3/uL — ABNORMAL LOW (ref 4.0–10.5)
nRBC: 0 % (ref 0.0–0.2)

## 2020-07-05 LAB — TROPONIN I (HIGH SENSITIVITY): Troponin I (High Sensitivity): 11 ng/L (ref <18)

## 2020-07-05 NOTE — ED Triage Notes (Signed)
The pt is c/o itching all over his body for several days and he reports little skin lesions  For 2-3 weeks difficulty breathing sats 100% itching all over his body sore muscles dizziness

## 2020-07-05 NOTE — ED Notes (Signed)
Pt did not want to stay and be seen by a doctor. He wanted to go home and get in his bed.

## 2020-07-07 ENCOUNTER — Ambulatory Visit (INDEPENDENT_AMBULATORY_CARE_PROVIDER_SITE_OTHER): Payer: Medicare Other | Admitting: Medical

## 2020-07-07 ENCOUNTER — Ambulatory Visit (HOSPITAL_BASED_OUTPATIENT_CLINIC_OR_DEPARTMENT_OTHER)
Admission: RE | Admit: 2020-07-07 | Discharge: 2020-07-07 | Disposition: A | Discharge status: Home / Self Care | Payer: Medicare Other | Source: Ambulatory Visit | Attending: Medical | Admitting: Medical

## 2020-07-07 ENCOUNTER — Encounter: Payer: Self-pay | Admitting: Medical

## 2020-07-07 ENCOUNTER — Other Ambulatory Visit: Payer: Self-pay

## 2020-07-07 ENCOUNTER — Telehealth: Payer: Self-pay | Admitting: Medical

## 2020-07-07 VITALS — BP 150/80 | HR 56 | Resp 18 | Ht 74.0 in | Wt 173.0 lb

## 2020-07-07 DIAGNOSIS — D619 Aplastic anemia, unspecified: Secondary | ICD-10-CM

## 2020-07-07 DIAGNOSIS — I509 Heart failure, unspecified: Secondary | ICD-10-CM | POA: Insufficient documentation

## 2020-07-07 DIAGNOSIS — R21 Rash and other nonspecific skin eruption: Secondary | ICD-10-CM

## 2020-07-07 DIAGNOSIS — Z515 Encounter for palliative care: Secondary | ICD-10-CM

## 2020-07-07 DIAGNOSIS — R110 Nausea: Secondary | ICD-10-CM | POA: Diagnosis not present

## 2020-07-07 DIAGNOSIS — R918 Other nonspecific abnormal finding of lung field: Secondary | ICD-10-CM

## 2020-07-07 DIAGNOSIS — R911 Solitary pulmonary nodule: Secondary | ICD-10-CM

## 2020-07-07 LAB — CBC WITH DIFFERENTIAL/PLATELET
Absolute Monocytes: 523 cells/uL (ref 200–950)
Basophils Absolute: 59 cells/uL (ref 0–200)
Basophils Relative: 1.5 %
Eosinophils Absolute: 90 cells/uL (ref 15–500)
Eosinophils Relative: 2.3 %
HCT: 39.5 % (ref 38.5–50.0)
Hemoglobin: 11.7 g/dL — ABNORMAL LOW (ref 13.2–17.1)
Lymphs Abs: 671 cells/uL — ABNORMAL LOW (ref 850–3900)
MCH: 24.7 pg — ABNORMAL LOW (ref 27.0–33.0)
MCHC: 29.6 g/dL — ABNORMAL LOW (ref 32.0–36.0)
MCV: 83.5 fL (ref 80.0–100.0)
MPV: 10.3 fL (ref 7.5–12.5)
Monocytes Relative: 13.4 %
Neutro Abs: 2558 cells/uL (ref 1500–7800)
Neutrophils Relative %: 65.6 %
Platelets: 172 10*3/uL (ref 140–400)
RBC: 4.73 10*6/uL (ref 4.20–5.80)
RDW: 17.9 % — ABNORMAL HIGH (ref 11.0–15.0)
Total Lymphocyte: 17.2 %
WBC: 3.9 10*3/uL (ref 3.8–10.8)

## 2020-07-07 LAB — BRAIN NATRIURETIC PEPTIDE: Brain Natriuretic Peptide: 931 pg/mL — ABNORMAL HIGH (ref <100)

## 2020-07-07 LAB — COMPREHENSIVE METABOLIC PANEL
AG Ratio: 1.3 (calc) (ref 1.0–2.5)
ALT: 11 U/L (ref 9–46)
AST: 19 U/L (ref 10–35)
Albumin: 3.8 g/dL (ref 3.6–5.1)
Alkaline phosphatase (APISO): 114 U/L (ref 35–144)
BUN: 22 mg/dL (ref 7–25)
CO2: 28 mmol/L (ref 20–32)
Calcium: 9.1 mg/dL (ref 8.6–10.3)
Chloride: 105 mmol/L (ref 98–110)
Creat: 0.93 mg/dL (ref 0.70–1.18)
Globulin: 3 g/dL (calc) (ref 1.9–3.7)
Glucose, Bld: 89 mg/dL (ref 65–99)
Potassium: 4.5 mmol/L (ref 3.5–5.3)
Sodium: 138 mmol/L (ref 135–146)
Total Bilirubin: 1.4 mg/dL — ABNORMAL HIGH (ref 0.2–1.2)
Total Protein: 6.8 g/dL (ref 6.1–8.1)

## 2020-07-07 LAB — LIPASE: Lipase: 11 U/L (ref 7–60)

## 2020-07-07 MED ORDER — AZITHROMYCIN 250 MG PO TABS: ORAL_TABLET | ORAL | 0 refills | Status: DC

## 2020-07-07 MED ORDER — ONDANSETRON 4 MG PO TBDP: 4.0000 mg | ORAL_TABLET | Freq: Three times a day (TID) | ORAL | 0 refills | Status: DC | PRN

## 2020-07-07 NOTE — Telephone Encounter (Signed)
Rx azithromycin sent to pt pharmacy. 

## 2020-07-07 NOTE — Progress Notes (Signed)
Subjective:    Patient ID: Carlos Welch, male    DOB: Jan 30, 1946, 74 y.o.   MRN: [id number]  HPI  Pt in for follow up. He went to the ED on 07-06-2020.  Pt is a 74 y.o. male that presents with a history, physical exam, and ED Clinical Course as noted above.   Patient presents today with shortness of breath/orthopnea.  Patient has a history of CHF.  Poor compliance with Lasix.  Patient was given 40 mg of IV Lasix here in the ER.  Patient states that he is starting to feel much better.  No notable hypoxia since arrival.  Patient was ambulated and was noted to have oxygen saturations of 100%.  Not tachycardic.  Chest x-ray does show some persistent partially loculated pleural effusion at the right base with a much smaller left pleural effusion.  Atelectasis at the right base.  Mild scarring of the right midlung.  No frank edema or consolidation evident.  Lab work today is generally reassuring.  BNP is elevated at 684.  Consistent with prior values.  Mild anemia at 11.1, also consistent with prior values.  No elevation in troponin x2.  His COVID-19 test is negative.  Recommended that patient begin taking his Lasix daily.  He states that he might need a new prescription, so I will provide this for him.  Patient is hemodynamically stable and in NAD at the time of d/c. Evaluation does not show pathology that would require ongoing emergent intervention or inpatient treatment. I explained the diagnosis to the patient. Patient is comfortable with above plan and is stable for discharge at this time. All questions were answered prior to disposition. Strict return precautions for returning to the ED were discussed. Encouraged follow up with PCP.   An After Visit Summary was printed and given to the patient.  Patient discharged to home/self care.  Condition at discharge: Stable  Pt tells me today he is aware should be compliant on lasix. He admits he was not taking lasix. Daugher states he was  fighting her every time he was supposed to take but now taking. Feeling some better now.    Chest x-ray does show some persistent partially loculated pleural effusion at the right base with a much smaller left pleural effusion.  Atelectasis at the right base.  Mild scarring of the right midlung.  No frank edema or consolidation evident.  I put in ct of chest. But not covered by insurance. Ordered based on recommendation. Abnormal xray - lung nodule, >= 1 cm; RIGHT lung base mass measuring 2.6 x 1.6. tried to pick mass in epic. could not find.  Dx: Solitary pulmonary nodule [R91.1 (ICD-10-CM)] (mass by ct. had to pick this as in epic mass in lung not present on order.)    Pt has appointment with cardiologist next week/wednesday.  . since 06-27-20 he has had 4 lb wt loss. Pt weight was 166 lb after lasix on Sept 20,2021.   Pt has eloped from ED twice since 16 th.  Pt on lasix 40 mg daily now.   Review of Systems  Constitutional: Negative for chills, fatigue and fever.  Respiratory: Negative for cough, chest tightness, shortness of breath and wheezing.   Cardiovascular: Negative for chest pain and palpitations.  Musculoskeletal: Negative for back pain, myalgias and neck stiffness.       Pedal edema.  Skin: Positive for rash.  Neurological: Negative for seizures, syncope, facial asymmetry and weakness.  Psychiatric/Behavioral: Negative for behavioral problems and  decreased concentration.       Objective:   Physical Exam  General- No acute distress. Pleasant patient. Neck- Full range of motion, no jvd Lungs- Clear, even and unlabored. Heart- regular rate and rhythm. Neurologic- CNII- XII grossly intact.  Abdomen- soft, nt, nd, +bs, no rebound or guarding.  Back- no cva tenderness.  Legs- symmetric 1-2+ pedal edema.  Skin-small tiny speckled scabs right side abdomen and upper back.  No vesicles seen.  No redness, warmth or tenderness.  No discharge.      Assessment &  Plan:  Patient has history of CHF and various visits to the emergency department recently.  He did stay for full work-up 1 time but the last 2 times he eloped early.  Since using Lasix 40 mg daily daughter states that he clinically appears to be improving.  Patient was noncompliant taking Lasix up until just recently.  We will get chest x-ray, BNP and a CMP today.  He does have appointment for follow-up with cardiologist next week.  Will make changes to Lasix dosage and regimen if needed after lab and chest x-ray review.  History of abnormal chest x-ray and CT.  Possible lung mass.  I placed CT of chest ordered to be done as repeat but prior authorization issues due to insurance.  Her daughter indicates that they are not able to pain for study out-of-pocket.  In light of this I decided go ahead and refer directly to pulmonology so they can review prior reports and make decision what it needs to be done.  Recent nausea so prescribe Zofran.  Nausea has been since mid summer.  We will get a lipase.  History of anemia and will repeat CBC today.  Intermittent occasional rash.  Undetermined cause.  Might be allergic reaction of undetermined origin presently.  Advised to use topical Benadryl and hydrocortisone.  We will see if this resolves.  Follow-up date to be determined after chest x-ray and lab review for review.  [name], PA-C    Time spent with patient today was 40+  minutes which consisted of chart review 3 ED visits, discussing diagnoses, work up, treatment, counseling on importance of completing full work-up when he goes to the emergency department and documentation.

## 2020-07-07 NOTE — Patient Instructions (Signed)
Patient has history of CHF and various visits to the emergency department recently.  He did stay for full work-up 1 time but the last 2 times he eloped early.  Since using Lasix 40 mg daily daughter states that he clinically appears to be improving.  Patient was noncompliant taking Lasix up until just recently.  We will get chest x-ray, BNP and a CMP today.  He does have appointment for follow-up with cardiologist next week.  Will make changes to Lasix dosage and regimen if needed after lab and chest x-ray review.  History of abnormal chest x-ray and CT.  Possible lung mass.  I placed CT of chest ordered to be done as repeat but prior authorization issues due to insurance.  Her daughter indicates that they are not able to pain for study out-of-pocket.  In light of this I decided go ahead and refer directly to pulmonology so they can review prior reports and make decision what it needs to be done.  Recent nausea so prescribe Zofran.  Nausea has been since mid summer.  We will get a lipase.  History of anemia and will repeat CBC today.  Intermittent occasional rash.  Undetermined cause.  Might be allergic reaction of undetermined origin presently.  Advised to use topical Benadryl and hydrocortisone.  We will see if this resolves.  Follow-up date to be determined after chest x-ray and lab review for review.

## 2020-07-08 DIAGNOSIS — L03119 Cellulitis of unspecified part of limb: Secondary | ICD-10-CM | POA: Insufficient documentation

## 2020-07-08 DIAGNOSIS — R778 Other specified abnormalities of plasma proteins: Secondary | ICD-10-CM | POA: Insufficient documentation

## 2020-07-08 DIAGNOSIS — D696 Thrombocytopenia, unspecified: Secondary | ICD-10-CM | POA: Insufficient documentation

## 2020-07-08 DIAGNOSIS — I872 Venous insufficiency (chronic) (peripheral): Secondary | ICD-10-CM

## 2020-07-08 DIAGNOSIS — I482 Chronic atrial fibrillation, unspecified: Secondary | ICD-10-CM | POA: Insufficient documentation

## 2020-07-08 DIAGNOSIS — D649 Anemia, unspecified: Secondary | ICD-10-CM | POA: Insufficient documentation

## 2020-07-08 DIAGNOSIS — R7989 Other specified abnormal findings of blood chemistry: Secondary | ICD-10-CM

## 2020-07-08 DIAGNOSIS — R600 Localized edema: Secondary | ICD-10-CM | POA: Insufficient documentation

## 2020-07-08 DIAGNOSIS — N400 Enlarged prostate without lower urinary tract symptoms: Secondary | ICD-10-CM

## 2020-07-08 DIAGNOSIS — E785 Hyperlipidemia, unspecified: Secondary | ICD-10-CM | POA: Insufficient documentation

## 2020-07-08 DIAGNOSIS — I10 Essential (primary) hypertension: Secondary | ICD-10-CM

## 2020-07-08 DIAGNOSIS — I5033 Acute on chronic diastolic (congestive) heart failure: Secondary | ICD-10-CM | POA: Insufficient documentation

## 2020-07-08 HISTORY — DX: Cellulitis of unspecified part of limb: L03.119

## 2020-07-08 HISTORY — DX: Chronic atrial fibrillation, unspecified: I48.20

## 2020-07-08 HISTORY — DX: Acute on chronic diastolic (congestive) heart failure: I50.33

## 2020-07-08 HISTORY — DX: Benign prostatic hyperplasia without lower urinary tract symptoms: N40.0

## 2020-07-08 HISTORY — DX: Thrombocytopenia, unspecified: D69.6

## 2020-07-08 HISTORY — DX: Hyperlipidemia, unspecified: E78.5

## 2020-07-08 HISTORY — DX: Venous insufficiency (chronic) (peripheral): I87.2

## 2020-07-08 HISTORY — DX: Localized edema: R60.0

## 2020-07-08 HISTORY — DX: Other specified abnormalities of plasma proteins: R77.8

## 2020-07-08 HISTORY — DX: Other specified abnormal findings of blood chemistry: R79.89

## 2020-07-08 HISTORY — DX: Anemia, unspecified: D64.9

## 2020-07-08 HISTORY — DX: Essential (primary) hypertension: I10

## 2020-07-19 ENCOUNTER — Other Ambulatory Visit: Payer: Self-pay

## 2020-07-19 ENCOUNTER — Encounter: Payer: Self-pay | Admitting: *Deleted

## 2020-07-19 DIAGNOSIS — R41841 Cognitive communication deficit: Secondary | ICD-10-CM | POA: Insufficient documentation

## 2020-07-19 DIAGNOSIS — T8579XA Infection and inflammatory reaction due to other internal prosthetic devices, implants and grafts, initial encounter: Secondary | ICD-10-CM | POA: Insufficient documentation

## 2020-07-19 DIAGNOSIS — I2510 Atherosclerotic heart disease of native coronary artery without angina pectoris: Secondary | ICD-10-CM | POA: Insufficient documentation

## 2020-07-19 DIAGNOSIS — I5042 Chronic combined systolic (congestive) and diastolic (congestive) heart failure: Secondary | ICD-10-CM | POA: Diagnosis not present

## 2020-07-19 DIAGNOSIS — G459 Transient cerebral ischemic attack, unspecified: Secondary | ICD-10-CM | POA: Insufficient documentation

## 2020-07-19 DIAGNOSIS — R062 Wheezing: Secondary | ICD-10-CM | POA: Insufficient documentation

## 2020-07-19 DIAGNOSIS — R0602 Shortness of breath: Secondary | ICD-10-CM | POA: Insufficient documentation

## 2020-07-19 DIAGNOSIS — R634 Abnormal weight loss: Secondary | ICD-10-CM

## 2020-07-19 DIAGNOSIS — R7881 Bacteremia: Secondary | ICD-10-CM

## 2020-07-19 DIAGNOSIS — Z87891 Personal history of nicotine dependence: Secondary | ICD-10-CM | POA: Diagnosis not present

## 2020-07-19 DIAGNOSIS — E43 Unspecified severe protein-calorie malnutrition: Secondary | ICD-10-CM | POA: Insufficient documentation

## 2020-07-19 DIAGNOSIS — R079 Chest pain, unspecified: Secondary | ICD-10-CM | POA: Diagnosis not present

## 2020-07-19 DIAGNOSIS — I110 Hypertensive heart disease with heart failure: Secondary | ICD-10-CM | POA: Insufficient documentation

## 2020-07-19 DIAGNOSIS — I340 Nonrheumatic mitral (valve) insufficiency: Secondary | ICD-10-CM | POA: Insufficient documentation

## 2020-07-19 DIAGNOSIS — R5381 Other malaise: Secondary | ICD-10-CM

## 2020-07-19 DIAGNOSIS — M272 Inflammatory conditions of jaws: Secondary | ICD-10-CM | POA: Insufficient documentation

## 2020-07-19 DIAGNOSIS — Z79899 Other long term (current) drug therapy: Secondary | ICD-10-CM | POA: Insufficient documentation

## 2020-07-19 DIAGNOSIS — Z7901 Long term (current) use of anticoagulants: Secondary | ICD-10-CM | POA: Insufficient documentation

## 2020-07-19 DIAGNOSIS — I350 Nonrheumatic aortic (valve) stenosis: Secondary | ICD-10-CM | POA: Insufficient documentation

## 2020-07-19 DIAGNOSIS — Z20822 Contact with and (suspected) exposure to covid-19: Secondary | ICD-10-CM | POA: Insufficient documentation

## 2020-07-19 DIAGNOSIS — I25810 Atherosclerosis of coronary artery bypass graft(s) without angina pectoris: Secondary | ICD-10-CM | POA: Insufficient documentation

## 2020-07-19 DIAGNOSIS — Z515 Encounter for palliative care: Secondary | ICD-10-CM | POA: Insufficient documentation

## 2020-07-19 DIAGNOSIS — Z5321 Procedure and treatment not carried out due to patient leaving prior to being seen by health care provider: Secondary | ICD-10-CM | POA: Insufficient documentation

## 2020-07-19 HISTORY — DX: Nonrheumatic mitral (valve) insufficiency: I34.0

## 2020-07-19 HISTORY — DX: Abnormal weight loss: R63.4

## 2020-07-19 HISTORY — DX: Atherosclerosis of coronary artery bypass graft(s) without angina pectoris: I25.810

## 2020-07-19 HISTORY — DX: Bacteremia: R78.81

## 2020-07-19 HISTORY — DX: Cognitive communication deficit: R41.841

## 2020-07-19 HISTORY — DX: Nonrheumatic aortic (valve) stenosis: I35.0

## 2020-07-19 HISTORY — DX: Inflammatory conditions of jaws: M27.2

## 2020-07-19 HISTORY — DX: Other malaise: R53.81

## 2020-07-19 HISTORY — DX: Infection and inflammatory reaction due to other internal prosthetic devices, implants and grafts, initial encounter: T85.79XA

## 2020-07-19 HISTORY — DX: Unspecified severe protein-calorie malnutrition: E43

## 2020-07-19 NOTE — ED Triage Notes (Addendum)
Itching all over. States he thinks he has shingles. Sob tonight. He was seen at [facility] for same 2 weeks ago. PO 100%. Speaking in complete sentences. Shingles rash to his right lateral back.

## 2020-07-20 ENCOUNTER — Ambulatory Visit: Payer: Medicare Other | Admitting: Cardiology

## 2020-07-20 ENCOUNTER — Emergency Department (HOSPITAL_COMMUNITY)
Admission: EM | Admit: 2020-07-20 | Discharge: 2020-07-20 | Disposition: A | Discharge status: Home / Self Care | Payer: Medicare Other | Source: Home / Self Care | Attending: Emergency Medicine | Admitting: Emergency Medicine

## 2020-07-20 ENCOUNTER — Emergency Department (HOSPITAL_BASED_OUTPATIENT_CLINIC_OR_DEPARTMENT_OTHER)
Admission: EM | Admit: 2020-07-20 | Discharge: 2020-07-20 | Disposition: A | Discharge status: Home / Self Care | Payer: Medicare Other | Attending: Emergency Medicine | Admitting: Emergency Medicine

## 2020-07-20 ENCOUNTER — Emergency Department (HOSPITAL_COMMUNITY): Payer: Medicare Other

## 2020-07-20 ENCOUNTER — Other Ambulatory Visit: Payer: Self-pay

## 2020-07-20 DIAGNOSIS — I5042 Chronic combined systolic (congestive) and diastolic (congestive) heart failure: Secondary | ICD-10-CM | POA: Insufficient documentation

## 2020-07-20 DIAGNOSIS — I110 Hypertensive heart disease with heart failure: Secondary | ICD-10-CM | POA: Insufficient documentation

## 2020-07-20 DIAGNOSIS — Z79899 Other long term (current) drug therapy: Secondary | ICD-10-CM | POA: Insufficient documentation

## 2020-07-20 DIAGNOSIS — Z20822 Contact with and (suspected) exposure to covid-19: Secondary | ICD-10-CM | POA: Insufficient documentation

## 2020-07-20 DIAGNOSIS — Z7901 Long term (current) use of anticoagulants: Secondary | ICD-10-CM | POA: Insufficient documentation

## 2020-07-20 DIAGNOSIS — I2510 Atherosclerotic heart disease of native coronary artery without angina pectoris: Secondary | ICD-10-CM | POA: Insufficient documentation

## 2020-07-20 DIAGNOSIS — R0602 Shortness of breath: Secondary | ICD-10-CM | POA: Insufficient documentation

## 2020-07-20 DIAGNOSIS — R072 Precordial pain: Secondary | ICD-10-CM | POA: Insufficient documentation

## 2020-07-20 DIAGNOSIS — Z87891 Personal history of nicotine dependence: Secondary | ICD-10-CM | POA: Insufficient documentation

## 2020-07-20 DIAGNOSIS — I509 Heart failure, unspecified: Secondary | ICD-10-CM

## 2020-07-20 HISTORY — DX: Generalized abdominal pain: R10.84

## 2020-07-20 LAB — TROPONIN I (HIGH SENSITIVITY)
Troponin I (High Sensitivity): 14 ng/L (ref <18)
Troponin I (High Sensitivity): 13 ng/L (ref <18)

## 2020-07-20 LAB — CBC
HCT: 35.8 % — ABNORMAL LOW (ref 39.0–52.0)
Hemoglobin: 10.8 g/dL — ABNORMAL LOW (ref 13.0–17.0)
MCH: 24.7 pg — ABNORMAL LOW (ref 26.0–34.0)
MCHC: 30.2 g/dL (ref 30.0–36.0)
MCV: 81.7 fL (ref 80.0–100.0)
Platelets: 146 10*3/uL — ABNORMAL LOW (ref 150–400)
RBC: 4.38 MIL/uL (ref 4.22–5.81)
RDW: 20.3 % — ABNORMAL HIGH (ref 11.5–15.5)
WBC: 3.9 10*3/uL — ABNORMAL LOW (ref 4.0–10.5)
nRBC: 0 % (ref 0.0–0.2)

## 2020-07-20 LAB — RESPIRATORY PANEL BY RT PCR (FLU A&B, COVID)
Influenza A by PCR: NEGATIVE
Influenza B by PCR: NEGATIVE
SARS Coronavirus 2 by RT PCR: NEGATIVE

## 2020-07-20 LAB — BASIC METABOLIC PANEL
Anion gap: 11 (ref 5–15)
BUN: 29 mg/dL — ABNORMAL HIGH (ref 8–23)
CO2: 22 mmol/L (ref 22–32)
Calcium: 8.9 mg/dL (ref 8.9–10.3)
Chloride: 106 mmol/L (ref 98–111)
Creatinine, Ser: 1.44 mg/dL — ABNORMAL HIGH (ref 0.61–1.24)
GFR calc Af Amer: 56 mL/min — ABNORMAL LOW (ref >60)
GFR calc non Af Amer: 48 mL/min — ABNORMAL LOW (ref >60)
Glucose, Bld: 94 mg/dL (ref 70–99)
Potassium: 4.2 mmol/L (ref 3.5–5.1)
Sodium: 139 mmol/L (ref 135–145)

## 2020-07-20 LAB — BRAIN NATRIURETIC PEPTIDE: B Natriuretic Peptide: 507.8 pg/mL — ABNORMAL HIGH (ref 0.0–100.0)

## 2020-07-20 MED ORDER — MORPHINE SULFATE (PF) 4 MG/ML IV SOLN
4.0000 mg | Freq: Once | INTRAVENOUS | Status: AC
  Administered 2020-07-20: 4 mg via INTRAVENOUS
  Filled 2020-07-20: qty 1

## 2020-07-20 MED ORDER — FUROSEMIDE 10 MG/ML IJ SOLN
40.0000 mg | Freq: Once | INTRAMUSCULAR | Status: AC
  Administered 2020-07-20: 40 mg via INTRAVENOUS
  Filled 2020-07-20: qty 4

## 2020-07-20 NOTE — ED Triage Notes (Signed)
BIB [facility] from home after pt called to report 8/10 non radiating C/P that began early AM. Pt also c/o abd. pain and generalized body aches. PT has hx of CHF, HTN, and valve replacement surgeries. EMS gave 1 SL nitro enroute, PT denies further c/p. Pt on 2 L [place], pt not on 02 at home.   Vitals on arrival:  B/P 143/88 Temp 98 oral 95 % 2 L [place] HR 60 RR 16

## 2020-07-20 NOTE — ED Provider Notes (Signed)
[facility] EMERGENCY DEPARTMENT Provider Note   CSN: [id number] Arrival date & time: 07/20/20  0456     History Chief Complaint  Patient presents with  . Chest Pain  . Generalized Body Aches    Carlos Welch is a 74 y.o. male.  Patient is a 74 year old male with extensive past medical history including coronary artery disease with CABG, aortic valve replacement, chronic A. fib, congestive heart failure, hypertension.  He presents today for evaluation of chest discomfort.  This started early this a.m.  Patient describes a sharp pain to the front of his chest along with shortness of breath, but no radiation, nausea, or diaphoresis.  He was given sublingual nitro and aspirin by EMS and this seemed to improve.  Patient denies any fevers, chills, or productive cough.  He does report some leg swelling, but this seems to be a chronic issue.  He is also currently being treated with Valtrex for a herpes zoster to his right flank.  This patient is new to the area.  He previously lived in [place].  Following a hospitalization for sepsis, he was discharged to a rehab facility, then came here to live with his daughter.  He has been to [place] in [place] on previous occasions with similar presentations.  It was a recommendation for him to be admitted, however it appears as though he has left AGAINST MEDICAL ADVICE on at least 1 previous occasion.  Most recent echocardiogram on file from July 2021 reveals LVH with EF of 55 to 60% with moderately elevated pulmonary artery systolic pressure.  The history is provided by the patient.  Chest Pain Pain location:  Substernal area Pain quality: pressure   Pain radiates to:  Does not radiate Pain severity:  Moderate Onset quality:  Sudden Duration:  4 hours Timing:  Constant Progression:  Resolved Chronicity:  New Relieved by:  Nitroglycerin and aspirin Worsened by:  Nothing Ineffective treatments:  None tried      Past  Medical History:  Diagnosis Date  . A-fib (HCC)   . Abscess of jaw 07/19/2020  . Acute on chronic heart failure with preserved ejection fraction (HCC) 07/08/2020  . Aortic stenosis    a. s/p pericardial AVR 2015.  Marland Kitchen Aortic valve stenosis 07/19/2020  . Bacteremia 07/19/2020  . Bacterial endocarditis   . BPH (benign prostatic hyperplasia) 07/08/2020  . CAD (coronary artery disease)    a. s/p CABG, AVR, LAA clipping 2015 at [facility].  . Cellulitis of lower extremity 07/08/2020  . Chronic atrial fibrillation (HCC) 07/08/2020  . Chronic combined systolic and diastolic CHF (congestive heart failure) (HCC)   . Cognitive communication deficit 07/19/2020  . Coronary artery disease involving coronary bypass graft of native heart without angina pectoris 07/19/2020  . Debility 07/19/2020  . 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 Elevated troponin 07/08/2020  . Essential hypertension 07/08/2020  . Generalized abdominal pain   . History of noncompliance with medical treatment   . Hyperlipemia 07/08/2020  . 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  . Malnutrition (HCC)   . MRSA bacteremia   . 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 Normocytic anemia 07/08/2020  . Pressure injury of skin 05/12/2020  . 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 Severe malnutrition (HCC) 07/19/2020  . Thrombocytopenia (HCC) 07/08/2020  . TIA (transient ischemic attack)   . Weight loss 07/19/2020    Patient Active Problem List   Diagnosis Date  Noted  . Abscess of jaw 07/19/2020  . Admission for palliative care 07/19/2020  . Aortic valve stenosis 07/19/2020  . Bacteremia 07/19/2020  . Cognitive communication deficit 07/19/2020  . Coronary artery disease involving coronary bypass graft of native heart without angina pectoris 07/19/2020  . Debility 07/19/2020  . 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  . Nonrheumatic mitral valve  regurgitation 07/19/2020  . Severe malnutrition (HCC) 07/19/2020  . TIA (transient ischemic attack) 07/19/2020  . Weight loss 07/19/2020  . Acute on chronic heart failure with preserved ejection fraction (HCC) 07/08/2020  . BPH (benign prostatic hyperplasia) 07/08/2020  . Cellulitis of lower extremity 07/08/2020  . 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 Elevated troponin 07/08/2020  . Essential hypertension 07/08/2020  . Hyperlipemia 07/08/2020  . Normocytic anemia 07/08/2020  . Thrombocytopenia (HCC) 07/08/2020  . Chronic atrial fibrillation (HCC) 07/08/2020  . Pressure injury of skin 05/12/2020  . Generalized abdominal pain     Past Surgical History:  Procedure Laterality Date  . AORTIC VALVE REPLACEMENT    . CORONARY ARTERY BYPASS GRAFT  2015  . HERNIA REPAIR    . REMOVAL OF IMPLANT  03/14/2020  . TEE WITHOUT CARDIOVERSION N/A 05/13/2020   Procedure: TRANSESOPHAGEAL ECHOCARDIOGRAM (TEE);  Surgeon: Lewayne Bunting, MD;  Location: [facility] ENDOSCOPY;  Service: Cardiovascular;  Laterality: N/A;       Family History  Problem Relation Age of Onset  . CAD Neg Hx     Social History   Tobacco Use  . Smoking status: Former Smoker    Types: Cigarettes  . Smokeless tobacco: Never Used  Substance Use Topics  . Alcohol use: No  . Drug use: No    Home Medications Prior to Admission medications   Medication Sig Start Date End Date Taking? Authorizing Provider  apixaban (ELIQUIS) 5 MG TABS tablet Take 1 tablet (5 mg total) by mouth in the morning and at bedtime. 05/19/20   Lewie Chamber, MD  atorvastatin (LIPITOR) 80 MG tablet Take 1 tablet (80 mg total) by mouth daily. 05/19/20   Lewie Chamber, MD  azithromycin (ZITHROMAX) 250 MG tablet Take 2 tablets by mouth on day 1, followed by 1 tablet by mouth daily for 4 days. 07/07/20   Saguier, [name], PA-C  carvedilol (COREG) 6.25 MG tablet Take 1 tablet (6.25 mg total) by mouth 2 (two) times daily with  a meal. 05/19/20   Lewie Chamber, MD  furosemide (LASIX) 40 MG tablet Take 1 tablet (40 mg total) by mouth daily as needed. 06/27/20   Placido Sou, PA-C  Ginkgo Biloba 40 MG TABS Take 1 tablet by mouth daily.    [provider]  HEMP OIL-VANILLYL BUTYL ETHER EX Apply 1 application topically daily.    [provider]  losartan (COZAAR) 100 MG tablet Take 1 tablet (100 mg total) by mouth daily. 06/09/20   Saguier, [name], PA-C  losartan (COZAAR) 50 MG tablet Take 1 tablet (50 mg total) by mouth daily. 05/20/20   Lewie Chamber, MD  Multiple Vitamin (MULTIVITAMIN WITH MINERALS) TABS tablet Take 1 tablet by mouth daily.    [provider]  ondansetron (ZOFRAN ODT) 4 MG disintegrating tablet Take 1 tablet (4 mg total) by mouth every 8 (eight) hours as needed for nausea or vomiting. 07/07/20   Saguier, [name], PA-C  tamsulosin (FLOMAX) 0.4 MG CAPS capsule Take 1 capsule (0.4 mg total) by mouth daily. 05/19/20   Lewie Chamber, MD  TURMERIC PO Take 1  tablet by mouth daily.    [provider]  ursodiol (ACTIGALL) 300 MG capsule Take 1 capsule (300 mg total) by mouth 2 (two) times daily. 05/19/20   Lewie Chamber, MD    Allergies    Chocolate flavor and Peanut oil  Review of Systems   Review of Systems  Cardiovascular: Positive for chest pain.  All other systems reviewed and are negative.   Physical Exam Updated Vital Signs BP (!) 143/88   Pulse 60   Temp 98 F (36.7 C) (Oral)   Resp 16   SpO2 95% Comment: 2 L [place]  Physical Exam Vitals and nursing note reviewed.  Constitutional:      General: He is not in acute distress.    Appearance: He is well-developed. He is not diaphoretic.  HENT:     Head: Normocephalic and atraumatic.  Cardiovascular:     Rate and Rhythm: Normal rate and regular rhythm.     Heart sounds: No murmur heard.  No friction rub.  Pulmonary:     Effort: Pulmonary effort is normal. No respiratory distress.     Breath sounds: Normal  breath sounds. No wheezing or rales.  Abdominal:     General: Bowel sounds are normal. There is no distension.     Palpations: Abdomen is soft.     Tenderness: There is no abdominal tenderness.  Musculoskeletal:        General: Normal range of motion.     Cervical back: Normal range of motion and neck supple.     Right lower leg: Edema present.     Left lower leg: Edema present.     Comments: There is 1+ pitting edema of both lower extremities.  Skin:    General: Skin is warm and dry.  Neurological:     Mental Status: He is alert and oriented to person, place, and time.     Coordination: Coordination normal.     ED Results / Procedures / Treatments   Labs (all labs ordered are listed, but only abnormal results are displayed) Labs Reviewed  RESPIRATORY PANEL BY RT PCR (FLU A&B, COVID)  BASIC METABOLIC PANEL  CBC  BRAIN NATRIURETIC PEPTIDE  TROPONIN I (HIGH SENSITIVITY)    EKG ED ECG REPORT   Date: 07/20/2020  Rate: 73  Rhythm: normal sinus rhythm  QRS Axis: normal  Intervals: normal  ST/T Wave abnormalities: nonspecific T wave changes  Conduction Disutrbances:none  Narrative Interpretation:   Old EKG Reviewed: none available  I have personally reviewed the EKG tracing and agree with the computerized printout as noted.      Radiology No results found.  Procedures Procedures (including critical care time)  Medications Ordered in ED Medications - No data to display  ED Course  I have reviewed the triage vital signs and the nursing notes.  Pertinent labs & imaging results that were available during my care of the patient were reviewed by me and considered in my medical decision making (see chart for details).    MDM Rules/Calculators/A&P  Patient with history of coronary artery disease with CABG, aortic valve replacement, A. fib, CHF presenting with chest discomfort shortness of breath.  Patient's work-up reveals an unchanged EKG, BNP of 500, chest x-ray  which is clear, and otherwise unremarkable laboratory studies.  Patient is awaiting a second troponin.  Disposition pending this result.  Care signed out to Dr. Bernette Mayers at shift change.  He will obtain the results of the troponin and determine the final disposition.  Final Clinical Impression(s) / ED Diagnoses Final diagnoses:  None    Rx / DC Orders ED Discharge Orders    None       Geoffery Lyonselo, Dell Hurtubise, MD 07/21/20 [phone]

## 2020-07-20 NOTE — ED Notes (Signed)
PT 02 sat remained at 96% while ambulating with a walker without oxygen. PT denies shob/dyspnea. PT did state they felt "whoozy" when getting up and moving around.

## 2020-07-20 NOTE — ED Notes (Signed)
Patient verbalizes understanding of discharge instructions. Opportunity for questioning and answers were provided. Armband removed by staff, pt discharged from ED via wheelchair and returned to lobby to wait for daughter.

## 2020-07-20 NOTE — ED Provider Notes (Signed)
Care of the patient assumed at the change of shift. Here for body aches and chest pain. He has a history of CHF and aortic valve replacement. Here frequently for evaluation.  Physical Exam  BP (!) 154/111 (BP Location: Left Arm)    Pulse (!) 55    Temp 98 F (36.7 C) (Oral)    Resp 14    SpO2 96%   Physical Exam Patient sleeping comfortably in no distress  ED Course/Procedures     Procedures  MDM  Patient given lasix and morphine during the night. Reports significant symptom improvement. Will ensure he can ambulate without dyspnea, hypoxia or tachycardia. If so, plan discharge with outpatient cardiology follow up.        Pollyann Savoy, MD 07/20/20 1104

## 2020-07-28 ENCOUNTER — Encounter: Payer: Self-pay | Admitting: Pulmonary Disease

## 2020-07-28 ENCOUNTER — Other Ambulatory Visit: Payer: Self-pay

## 2020-07-28 ENCOUNTER — Ambulatory Visit (INDEPENDENT_AMBULATORY_CARE_PROVIDER_SITE_OTHER): Payer: Medicare Other | Admitting: Pulmonary Disease

## 2020-07-28 VITALS — BP 128/90 | HR 63 | Temp 97.0°F | Ht 74.0 in | Wt 170.0 lb

## 2020-07-28 DIAGNOSIS — I25810 Atherosclerosis of coronary artery bypass graft(s) without angina pectoris: Secondary | ICD-10-CM

## 2020-07-28 DIAGNOSIS — I350 Nonrheumatic aortic (valve) stenosis: Secondary | ICD-10-CM

## 2020-07-28 DIAGNOSIS — R0600 Dyspnea, unspecified: Secondary | ICD-10-CM

## 2020-07-28 DIAGNOSIS — I340 Nonrheumatic mitral (valve) insufficiency: Secondary | ICD-10-CM

## 2020-07-28 DIAGNOSIS — R911 Solitary pulmonary nodule: Secondary | ICD-10-CM

## 2020-07-28 DIAGNOSIS — R0609 Other forms of dyspnea: Secondary | ICD-10-CM

## 2020-07-28 MED ORDER — IPRATROPIUM BROMIDE 0.02 % IN SOLN: 0.5000 mg | Freq: Three times a day (TID) | RESPIRATORY_TRACT | 11 refills | Status: DC

## 2020-07-28 NOTE — Patient Instructions (Addendum)
We will repeat a CT scan of your lung in the coming weeks to make sure the spot on lower right lung is better.  Try the ipratropium nebulized 3 times a day - this may help the shortness of breath.  We should consider a test for sleep apnea in the future.  Come back in 3 months for follow up with Dr. Judeth Horn

## 2020-07-28 NOTE — Progress Notes (Signed)
Patient ID: Carlos Welch, male    DOB: [birth year], 74 y.o.   MRN: [id number]  Chief Complaint  Patient presents with  . Consult    SOB     Referring provider: Saguier, Kateri Mc  HPI:   Carlos Welch is a 74 year old man whom we are seeing at the request of Saguier, Kateri Mc for evaluation of shortness of breath.  Patient is a bit tangential.  He says he short of breath all times.  Is been going on for some time.  He and daughter in room give history.  He was hospitalized and in rehab for several months in [place] last year with bad infections of lower extremity ulcers and what sounds like MRSA bacteremia plus or minus endocarditis.  What prompted initial decline sounds like gross volume overload in the weeks preceding this hospitalization.  He was diagnosed with severe mitral valve regurg, he also has a to valve stenosis on review of echocardiograms.  After several weeks to months in the hospital was discharged to rehab recent several months.  After discharge from rehab, he came to [place] to live with his daughter.  Evidently he was not taking care of himself per daughter report.  After about a month in [place], he ended up back in the hospital primarily with shortness of breath and volume overload.  He has additional A. fib.  He was in the hospital for several days.  He was treated for septic shock in the setting of possible concurrent pneumonia and cholecystitis.  He was deemed not a surgical candidate so was on prolonged antibiotics.  CT scan during hospitalization part of work-up demonstrated on my interpretation no evidence of pneumonia but did show bilateral loculated pleural effusions indicating significant chronicity to the effusions as well as symmetric interlobular septal thickening all consistent with volume overload.  There is a 42 similar nodule in the right lower lobe abutting the pleura which appears to be intraparenchymal as there appeared to be evidence of  vasculature and it to my eye represents rounded atelectasis but ongoing follow-up was recommended to evaluate for potential malignancy or other cause of infiltrate.  He has not had a CT scan done as recommended.  Recent note with PCP indicates this is for financial reasons.  Today he complains at length about shortness of breath.  He has severely short of breath.  Short of breath at rest.  Not very mobile as he says when he stands up he gets very dizzy.  It sounds like he has been relatively immobile for the last year plus going on for 2 years.  We discussed at length the rationale for ongoing testing and medicines to try to help diagnose intrinsic lung problem and treat.  He multiple times comes back to wanting morphine over and over again.  I discussed that that is not my role at this time and that morphine would do the opposite of helping me diagnose withdrawal with his breathing.  We did discuss that morphine is quite potent at palliating dyspnea and would be a reasonable  PMH: Severe mitral regurg, reported valve replacement, atrial fibrillation Surgical history: Aortic valve replacement, CABG, hernia repair Family history: No history of coronary disease, no respiratory issues Social history: Former smoker, he minimizes smoking use but reported to have over 99-pack-year smoking history in the past, lives with daughter   Questionaires / Pulmonary Flowsheets:   ACT:  No flowsheet data found.  MMRC: No flowsheet data found.  Epworth:  No flowsheet data found.  Tests:   FENO:  No results found for: NITRICOXIDE  PFT: No flowsheet data found.  WALK:  No flowsheet data found.  Imaging: DG Chest 2 View  Result Date: 07/20/2020 CLINICAL DATA:  Initial evaluation for acute chest pain. EXAM: CHEST - 2 VIEW COMPARISON:  Prior radiograph from 07/07/2020. FINDINGS: Median sternotomy wires with underlying valvular replacement, surgical clips, and left atrial appendage clip. Cardiomegaly,  stable. Mediastinal silhouette within normal limits. Aortic atherosclerosis. Lungs normally inflated. Blunting of the right costophrenic angle suggestive of a small effusion. Diffuse vascular and interstitial prominence most consistent with pulmonary interstitial edema. Superimposed more hazy opacity at the right lung base favored to reflect edema and/or effusion. No definite focal infiltrates. No pneumothorax. No acute osseous finding. IMPRESSION: 1. Cardiomegaly with diffuse pulmonary interstitial edema and small right pleural effusion. 2. Superimposed more hazy opacity at the right lung base, favored to reflect congestion and/or effusion, although infiltrate could be considered in the correct clinical setting. Electronically Signed   By: Rise Mu M.D.   On: 07/20/2020 05:51   DG Chest 2 View  Result Date: 07/07/2020 CLINICAL DATA:  Shortness of breath EXAM: CHEST - 2 VIEW COMPARISON:  07/05/2020 FINDINGS: Similar small pleural effusions, slightly larger on the right. Heart is enlarged with similar mild interstitial edema pattern and bibasilar atelectasis/scarring. Difficult to exclude basilar pneumonia. No pneumothorax. Aorta atherosclerotic. Postop changes evident. Degenerative changes of the spine. IMPRESSION: Stable cardiomegaly with vascular congestion/early interstitial edema pattern and similar small pleural effusions. Bibasilar atelectasis versus pneumonia. Electronically Signed   By: Judie Petit.  Shick M.D.   On: 07/07/2020 11:03   DG Chest 2 View  Result Date: 07/05/2020 CLINICAL DATA:  Chest pain shortness of breath EXAM: CHEST - 2 VIEW COMPARISON:  07/01/2020 FINDINGS: Cardiac shadow is enlarged but stable. Postsurgical changes are again seen. Aortic calcifications are noted. Increasing right-sided pleural effusion is noted. Mild interstitial changes are noted stable from the prior exam. No other focal abnormality is noted. IMPRESSION: Increasing right-sided pleural effusion. Stable mild  vascular congestion. Electronically Signed   By: Alcide Clever M.D.   On: 07/05/2020 03:39   DG Chest 2 View  Result Date: 07/01/2020 CLINICAL DATA:  Short of breath and dizziness. Hypertension. Ex-smoker. EXAM: CHEST - 2 VIEW COMPARISON:  06/27/2020 FINDINGS: Hyperinflation. Prior median sternotomy. Aortic valve repair and left atrial appendage occlusion device. Midline trachea. Moderate cardiomegaly. Atherosclerosis in the transverse aorta. Tortuous thoracic aorta. Small right pleural effusion with mild lateral loculation, similar. Trace left pleural fluid or thickening, similar. No pneumothorax. Interstitial prominence. Left base scarring. Persistent right base airspace disease. IMPRESSION: No significant change compared to 06/27/2020. Hyperinflation and interstitial thickening, most consistent with COPD/chronic bronchitis. Cannot exclude concurrent pulmonary venous congestion. Small right pleural effusion with loculation. Adjacent airspace disease is favored to represent atelectasis. Aortic Atherosclerosis (ICD10-I70.0). Electronically Signed   By: Jeronimo Greaves M.D.   On: 07/01/2020 20:20    Lab Results:  CBC    Component Value Date/Time   WBC 3.9 (L) 07/20/2020 0518   RBC 4.38 07/20/2020 0518   HGB 10.8 (L) 07/20/2020 0518   HCT 35.8 (L) 07/20/2020 0518   PLT 146 (L) 07/20/2020 0518   MCV 81.7 07/20/2020 0518   MCH 24.7 (L) 07/20/2020 0518   MCHC 30.2 07/20/2020 0518   RDW 20.3 (H) 07/20/2020 0518   LYMPHSABS 671 (L) 07/07/2020 1033   MONOABS 0.6 06/16/2020 1331   EOSABS 90 07/07/2020 1033   BASOSABS 59  07/07/2020 1033    BMET    Component Value Date/Time   NA 139 07/20/2020 0518   K 4.2 07/20/2020 0518   CL 106 07/20/2020 0518   CO2 22 07/20/2020 0518   GLUCOSE 94 07/20/2020 0518   BUN 29 (H) 07/20/2020 0518   CREATININE 1.44 (H) 07/20/2020 0518   CREATININE 0.93 07/07/2020 1033   CALCIUM 8.9 07/20/2020 0518   GFRNONAA 48 (L) 07/20/2020 0518   GFRAA 56 (L) 07/20/2020 0518     BNP    Component Value Date/Time   BNP 507.8 (H) 07/20/2020 0525   BNP 931 (H) 07/07/2020 1033    ProBNP No results found for: PROBNP  Specialty Problems    None      Allergies  Allergen Reactions  . Chocolate Flavor Rash  . Peanut Oil Rash    Immunization History  Administered Date(s) Administered  . Moderna SARS-COVID-2 Vaccination 06/19/2020    Past Medical History:  Diagnosis Date  . A-fib (HCC)   . Abscess of jaw 07/19/2020  . Acute on chronic heart failure with preserved ejection fraction (HCC) 07/08/2020  . Aortic stenosis    a. s/p pericardial AVR 2015.  Marland Kitchen. Aortic valve stenosis 07/19/2020  . Bacteremia 07/19/2020  . Bacterial endocarditis   . BPH (benign prostatic hyperplasia) 07/08/2020  . CAD (coronary artery disease)    [facility]  . Cellulitis of lower extremity 07/08/2020  . Chronic atrial fibrillation (HCC) 07/08/2020  . Chronic combined systolic and diastolic CHF (congestive heart failure) (HCC)   . Cognitive communication deficit 07/19/2020  . Coronary artery disease involving coronary bypass graft of native heart without angina pectoris 07/19/2020  . Debility 07/19/2020  . 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 Elevated troponin 07/08/2020  . Essential hypertension 07/08/2020  . Generalized abdominal pain   . History of noncompliance with medical treatment   . Hyperlipemia 07/08/2020  . 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  . Malnutrition (HCC)   . MRSA bacteremia   . 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 Normocytic anemia 07/08/2020  . Pressure injury of skin 05/12/2020  . 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 Severe malnutrition (HCC) 07/19/2020  . Thrombocytopenia (HCC) 07/08/2020  . TIA (transient ischemic attack)   . Weight loss 07/19/2020    Tobacco History: Social History   Tobacco Use  Smoking Status Former  Smoker  . Packs/day: 3.00  . Years: 47.00  . Pack years: 141.00  . Types: Cigarettes, Cigars  . Start date: 1964  . Quit date: 07/28/2010  . Years since quitting: 10.0  Smokeless Tobacco Never Used   Counseling given: Not Answered   Continue to not smoke  Outpatient Encounter Medications as of 07/28/2020  Medication Sig  . apixaban (ELIQUIS) 5 MG TABS tablet Take 1 tablet (5 mg total) by mouth in the morning and at bedtime.  Marland Kitchen. atorvastatin (LIPITOR) 80 MG tablet Take 1 tablet (80 mg total) by mouth daily.  Marland Kitchen. azithromycin (ZITHROMAX) 250 MG tablet Take 2 tablets by mouth on day 1, followed by 1 tablet by mouth daily for 4 days.  . carvedilol (COREG) 6.25 MG tablet Take 1 tablet (6.25 mg total) by mouth 2 (two) times daily with a meal.  . furosemide (LASIX) 40 MG tablet Take 1 tablet (40 mg total) by mouth daily as needed.  . Ginkgo Biloba 40 MG TABS Take 1 tablet by  mouth daily.  Marland Kitchen HEMP OIL-VANILLYL BUTYL ETHER EX Apply 1 application topically daily.  Marland Kitchen losartan (COZAAR) 100 MG tablet Take 1 tablet (100 mg total) by mouth daily.  Marland Kitchen losartan (COZAAR) 50 MG tablet Take 1 tablet (50 mg total) by mouth daily.  . Multiple Vitamin (MULTIVITAMIN WITH MINERALS) TABS tablet Take 1 tablet by mouth daily.  . ondansetron (ZOFRAN ODT) 4 MG disintegrating tablet Take 1 tablet (4 mg total) by mouth every 8 (eight) hours as needed for nausea or vomiting.  . tamsulosin (FLOMAX) 0.4 MG CAPS capsule Take 1 capsule (0.4 mg total) by mouth daily.  . TURMERIC PO Take 1 tablet by mouth daily.  . ursodiol (ACTIGALL) 300 MG capsule Take 1 capsule (300 mg total) by mouth 2 (two) times daily.  Marland Kitchen ipratropium (ATROVENT) 0.02 % nebulizer solution Take 2.5 mLs (0.5 mg total) by nebulization in the morning, at noon, and at bedtime.   No facility-administered encounter medications on file as of 07/28/2020.     Review of Systems  Review of Systems  He endorses orthopnea. Lower extremity swelling seems improved  with regular Lasix dosing. Comprehensive review of systems otherwise negative.  Physical Exam  BP 128/90 (BP Location: Left Arm, Cuff Size: Normal)   Pulse 63   Temp (!) 97 F (36.1 C) (Temporal)   Ht 6\' 2"  (1.88 m) Comment: per patient  Wt 170 lb (77.1 kg) Comment: per patient  SpO2 100%   BMI 21.83 kg/m   Wt Readings from Last 5 Encounters:  07/28/20 170 lb (77.1 kg)  07/19/20 173 lb 1 oz (78.5 kg)  07/07/20 173 lb (78.5 kg)  07/05/20 166 lb (75.3 kg)  07/01/20 177 lb 14.6 oz (80.7 kg)    BMI Readings from Last 5 Encounters:  07/28/20 21.83 kg/m  07/19/20 22.22 kg/m  07/07/20 22.21 kg/m  07/05/20 21.31 kg/m  07/01/20 22.84 kg/m     Physical Exam General: Chronically ill appearing, in NAD Eyes: EOMI, no icterus Neck: Supple, no JVP appreciated Respiratory: Crackles in bilateral bases on inspiration, no dullness to percussion, Cardiovascular: Regular rate and rhythm, loud blowing holosystolic murmur heard best at left upper sternal border Abdomen: Soft, thin, does not present Extremities: Cool, 1-2+ pitting edema to midshin bilaterally MSK: No joint effusion, no synovitis Neuro: Sitting in wheelchair, no obvious weakness, cranial nerves appear intact Psych: Easily irritable, flat affect  Assessment & Plan:   Shortness of breath: Largely related to valvular dysfunction and volume overload as well as severe deconditioning. Do not think he could participate with PFTs and would be ideally performed once euvolemic. Will trial bronchodilator via nebulized ipratropium - avoiding albuterol given h/o afib which if in RVR will make everything worse. Lastly, discussed at length the role for testing or evaluation for persistent obstructive sleep apnea. He states he would never wear a mask and does not want to go forward with this at this time.  Lung nodule: RLL rounded opacity - favor atelectasis from fluid overload. Will repeat CT scan soon (3 months from prior scan) to  ensure it is better/unchanged.   Goals of care: He reiterates multiple times he wants stronger medicine for pain and help with his SOB. He asks for morphine multiple times. Discussed that that the best path forward to meet his own self professed goals would be palliative care/hospice.   Return in about 3 months (around 10/28/2020).   10/30/2020, MD 07/28/2020

## 2020-08-01 ENCOUNTER — Encounter (HOSPITAL_COMMUNITY): Payer: Self-pay | Admitting: Emergency Medicine

## 2020-08-01 ENCOUNTER — Other Ambulatory Visit: Payer: Self-pay

## 2020-08-01 ENCOUNTER — Emergency Department (HOSPITAL_COMMUNITY)
Admission: EM | Admit: 2020-08-01 | Discharge: 2020-08-01 | Disposition: A | Discharge status: Home / Self Care | Payer: Medicare Other | Source: Home / Self Care | Attending: Emergency Medicine | Admitting: Emergency Medicine

## 2020-08-01 ENCOUNTER — Emergency Department (HOSPITAL_COMMUNITY)
Admission: EM | Admit: 2020-08-01 | Discharge: 2020-08-01 | Disposition: A | Discharge status: Home / Self Care | Payer: Medicare Other | Attending: Emergency Medicine | Admitting: Emergency Medicine

## 2020-08-01 ENCOUNTER — Telehealth: Payer: Self-pay | Admitting: Medical

## 2020-08-01 ENCOUNTER — Emergency Department (HOSPITAL_COMMUNITY): Payer: Medicare Other

## 2020-08-01 DIAGNOSIS — I482 Chronic atrial fibrillation, unspecified: Secondary | ICD-10-CM

## 2020-08-01 DIAGNOSIS — I110 Hypertensive heart disease with heart failure: Secondary | ICD-10-CM | POA: Insufficient documentation

## 2020-08-01 DIAGNOSIS — I517 Cardiomegaly: Secondary | ICD-10-CM | POA: Insufficient documentation

## 2020-08-01 DIAGNOSIS — R1030 Lower abdominal pain, unspecified: Secondary | ICD-10-CM

## 2020-08-01 DIAGNOSIS — R269 Unspecified abnormalities of gait and mobility: Secondary | ICD-10-CM

## 2020-08-01 DIAGNOSIS — I5023 Acute on chronic systolic (congestive) heart failure: Secondary | ICD-10-CM

## 2020-08-01 DIAGNOSIS — I2510 Atherosclerotic heart disease of native coronary artery without angina pectoris: Secondary | ICD-10-CM | POA: Insufficient documentation

## 2020-08-01 DIAGNOSIS — I10 Essential (primary) hypertension: Secondary | ICD-10-CM

## 2020-08-01 DIAGNOSIS — R1084 Generalized abdominal pain: Secondary | ICD-10-CM | POA: Insufficient documentation

## 2020-08-01 DIAGNOSIS — R0602 Shortness of breath: Secondary | ICD-10-CM | POA: Diagnosis present

## 2020-08-01 DIAGNOSIS — G4733 Obstructive sleep apnea (adult) (pediatric): Secondary | ICD-10-CM

## 2020-08-01 DIAGNOSIS — Z79899 Other long term (current) drug therapy: Secondary | ICD-10-CM | POA: Insufficient documentation

## 2020-08-01 DIAGNOSIS — Z87891 Personal history of nicotine dependence: Secondary | ICD-10-CM | POA: Insufficient documentation

## 2020-08-01 DIAGNOSIS — Z7982 Long term (current) use of aspirin: Secondary | ICD-10-CM | POA: Insufficient documentation

## 2020-08-01 DIAGNOSIS — Z7901 Long term (current) use of anticoagulants: Secondary | ICD-10-CM | POA: Insufficient documentation

## 2020-08-01 DIAGNOSIS — J90 Pleural effusion, not elsewhere classified: Secondary | ICD-10-CM | POA: Diagnosis not present

## 2020-08-01 DIAGNOSIS — R911 Solitary pulmonary nodule: Secondary | ICD-10-CM

## 2020-08-01 DIAGNOSIS — G4734 Idiopathic sleep related nonobstructive alveolar hypoventilation: Secondary | ICD-10-CM

## 2020-08-01 DIAGNOSIS — R110 Nausea: Secondary | ICD-10-CM | POA: Insufficient documentation

## 2020-08-01 DIAGNOSIS — I509 Heart failure, unspecified: Secondary | ICD-10-CM

## 2020-08-01 DIAGNOSIS — R918 Other nonspecific abnormal finding of lung field: Secondary | ICD-10-CM

## 2020-08-01 DIAGNOSIS — R188 Other ascites: Secondary | ICD-10-CM | POA: Diagnosis not present

## 2020-08-01 DIAGNOSIS — D619 Aplastic anemia, unspecified: Secondary | ICD-10-CM

## 2020-08-01 DIAGNOSIS — Z951 Presence of aortocoronary bypass graft: Secondary | ICD-10-CM | POA: Insufficient documentation

## 2020-08-01 LAB — COMPREHENSIVE METABOLIC PANEL
ALT: 13 U/L (ref 0–44)
ALT: 13 U/L (ref 0–44)
AST: 30 U/L (ref 15–41)
AST: 26 U/L (ref 15–41)
Albumin: 3.4 g/dL — ABNORMAL LOW (ref 3.5–5.0)
Albumin: 3.8 g/dL (ref 3.5–5.0)
Alkaline Phosphatase: 104 U/L (ref 38–126)
Alkaline Phosphatase: 99 U/L (ref 38–126)
Anion gap: 11 (ref 5–15)
Anion gap: 9 (ref 5–15)
BUN: 22 mg/dL (ref 8–23)
BUN: 22 mg/dL (ref 8–23)
CO2: 25 mmol/L (ref 22–32)
CO2: 26 mmol/L (ref 22–32)
Calcium: 9.3 mg/dL (ref 8.9–10.3)
Calcium: 9 mg/dL (ref 8.9–10.3)
Chloride: 105 mmol/L (ref 98–111)
Chloride: 104 mmol/L (ref 98–111)
Creatinine, Ser: 1.18 mg/dL (ref 0.61–1.24)
Creatinine, Ser: 1.12 mg/dL (ref 0.61–1.24)
GFR, Estimated: >60 mL/min (ref >60)
GFR, Estimated: >60 mL/min (ref >60)
Glucose, Bld: 107 mg/dL — ABNORMAL HIGH (ref 70–99)
Glucose, Bld: 98 mg/dL (ref 70–99)
Potassium: 4 mmol/L (ref 3.5–5.1)
Potassium: 3.6 mmol/L (ref 3.5–5.1)
Sodium: 141 mmol/L (ref 135–145)
Sodium: 139 mmol/L (ref 135–145)
Total Bilirubin: 1.4 mg/dL — ABNORMAL HIGH (ref 0.3–1.2)
Total Bilirubin: 1.9 mg/dL — ABNORMAL HIGH (ref 0.3–1.2)
Total Protein: 6.8 g/dL (ref 6.5–8.1)
Total Protein: 7.3 g/dL (ref 6.5–8.1)

## 2020-08-01 LAB — CBC WITH DIFFERENTIAL/PLATELET
Abs Immature Granulocytes: 0.02 10*3/uL (ref 0.00–0.07)
Abs Immature Granulocytes: 0.01 10*3/uL (ref 0.00–0.07)
Basophils Absolute: 0.1 10*3/uL (ref 0.0–0.1)
Basophils Absolute: 0.1 10*3/uL (ref 0.0–0.1)
Basophils Relative: 1 %
Basophils Relative: 2 %
Eosinophils Absolute: 0.1 10*3/uL (ref 0.0–0.5)
Eosinophils Absolute: 0.1 10*3/uL (ref 0.0–0.5)
Eosinophils Relative: 3 %
Eosinophils Relative: 2 %
HCT: 41.8 % (ref 39.0–52.0)
HCT: 43.9 % (ref 39.0–52.0)
Hemoglobin: 12.6 g/dL — ABNORMAL LOW (ref 13.0–17.0)
Hemoglobin: 13 g/dL (ref 13.0–17.0)
Immature Granulocytes: 1 %
Immature Granulocytes: 0 %
Lymphocytes Relative: 21 %
Lymphocytes Relative: 20 %
Lymphs Abs: 0.9 10*3/uL (ref 0.7–4.0)
Lymphs Abs: 0.8 10*3/uL (ref 0.7–4.0)
MCH: 24.8 pg — ABNORMAL LOW (ref 26.0–34.0)
MCH: 24.3 pg — ABNORMAL LOW (ref 26.0–34.0)
MCHC: 30.1 g/dL (ref 30.0–36.0)
MCHC: 29.6 g/dL — ABNORMAL LOW (ref 30.0–36.0)
MCV: 82.3 fL (ref 80.0–100.0)
MCV: 82.1 fL (ref 80.0–100.0)
Monocytes Absolute: 0.5 10*3/uL (ref 0.1–1.0)
Monocytes Absolute: 0.4 10*3/uL (ref 0.1–1.0)
Monocytes Relative: 11 %
Monocytes Relative: 11 %
Neutro Abs: 2.7 10*3/uL (ref 1.7–7.7)
Neutro Abs: 2.6 10*3/uL (ref 1.7–7.7)
Neutrophils Relative %: 63 %
Neutrophils Relative %: 65 %
Platelets: 157 10*3/uL (ref 150–400)
Platelets: 160 10*3/uL (ref 150–400)
RBC: 5.08 MIL/uL (ref 4.22–5.81)
RBC: 5.35 MIL/uL (ref 4.22–5.81)
RDW: 21.2 % — ABNORMAL HIGH (ref 11.5–15.5)
RDW: 21.3 % — ABNORMAL HIGH (ref 11.5–15.5)
WBC: 4.2 10*3/uL (ref 4.0–10.5)
WBC: 4 10*3/uL (ref 4.0–10.5)
nRBC: 0 % (ref 0.0–0.2)
nRBC: 0 % (ref 0.0–0.2)

## 2020-08-01 LAB — RAPID URINE DRUG SCREEN, HOSP PERFORMED
Amphetamines: NOT DETECTED
Barbiturates: NOT DETECTED
Benzodiazepines: NOT DETECTED
Cocaine: NOT DETECTED
Opiates: NOT DETECTED
Tetrahydrocannabinol: POSITIVE — AB

## 2020-08-01 LAB — URINALYSIS, ROUTINE W REFLEX MICROSCOPIC
Bacteria, UA: NONE SEEN
Bilirubin Urine: NEGATIVE
Bilirubin Urine: NEGATIVE
Glucose, UA: NEGATIVE mg/dL
Glucose, UA: NEGATIVE mg/dL
Hgb urine dipstick: NEGATIVE
Hgb urine dipstick: NEGATIVE
Ketones, ur: NEGATIVE mg/dL
Ketones, ur: 5 mg/dL — AB
Leukocytes,Ua: NEGATIVE
Leukocytes,Ua: NEGATIVE
Nitrite: NEGATIVE
Nitrite: NEGATIVE
Protein, ur: 100 mg/dL — AB
Specific Gravity, Urine: 1.025 (ref 1.005–1.030)
Specific Gravity, Urine: 1.027 (ref 1.005–1.030)
pH: 5.5 (ref 5.0–8.0)
pH: 5 (ref 5.0–8.0)

## 2020-08-01 LAB — URINALYSIS, MICROSCOPIC (REFLEX)
Bacteria, UA: NONE SEEN
RBC / HPF: NONE SEEN RBC/hpf (ref 0–5)
WBC, UA: NONE SEEN WBC/hpf (ref 0–5)

## 2020-08-01 LAB — TROPONIN I (HIGH SENSITIVITY): Troponin I (High Sensitivity): 16 ng/L (ref <18)

## 2020-08-01 LAB — BRAIN NATRIURETIC PEPTIDE
B Natriuretic Peptide: 1131.4 pg/mL — ABNORMAL HIGH (ref 0.0–100.0)
B Natriuretic Peptide: 1200.1 pg/mL — ABNORMAL HIGH (ref 0.0–100.0)

## 2020-08-01 LAB — LIPASE, BLOOD: Lipase: 31 U/L (ref 11–51)

## 2020-08-01 MED ORDER — FENTANYL CITRATE (PF) 100 MCG/2ML IJ SOLN
50.0000 ug | Freq: Once | INTRAMUSCULAR | Status: AC
  Administered 2020-08-01: 50 ug via INTRAVENOUS
  Filled 2020-08-01: qty 2

## 2020-08-01 MED ORDER — FUROSEMIDE 10 MG/ML IJ SOLN
60.0000 mg | Freq: Once | INTRAMUSCULAR | Status: AC
  Administered 2020-08-01: 60 mg via INTRAVENOUS
  Filled 2020-08-01: qty 8

## 2020-08-01 NOTE — Discharge Instructions (Addendum)
As discussed, your evaluation today has been largely reassuring.  But, it is important that you monitor your condition carefully, and do not hesitate to return to the ED if you develop new, or concerning changes in your condition. ? ?Otherwise, please follow-up with your physician for appropriate ongoing care. ? ?

## 2020-08-01 NOTE — ED Provider Notes (Signed)
[facility]-EMERGENCY DEPT Provider Note   CSN: [id number] Arrival date & time: 08/01/20  1827     History Chief Complaint  Patient presents with  . Shortness of Breath    Carlos Welch is a 74 y.o. male.  HPI   Patient presents for the second time today, ninth time in 6 months, now with concern for dyspnea, abdominal pain.  It seems as though the abdominal pain is present for a long time, possibly more present over the past 2 days, but he notes that today he has had persistent dyspnea in spite of being seen, evaluated, treated earlier today.  Dyspnea also seems chronic, possibly worse over the past few days.  Patient notes that he is taking his medication as directed, which includes Lasix.  Additional history is obtained by chart review, both from earlier visit today, and from pulmonology clinic earlier this week.  Patient seems to have a history of multiple recent hospitalizations in [place] and [place].  He had episodes of bacteremia, dyspnea. Patient is a tangential historian, but the pain in his abdomen is across the midsection, nonradiating, sore, persistent, severe.  It is unclear if he is taking any medication for this.  He denies vomiting or diarrhea.  He denies fever today.  He may have taken his medication today as well.  Past Medical History:  Diagnosis Date  . A-fib (HCC)   . Abscess of jaw 07/19/2020  . Acute on chronic heart failure with preserved ejection fraction (HCC) 07/08/2020  . Aortic stenosis    a. s/p pericardial AVR 2015.  Marland Kitchen Aortic valve stenosis 07/19/2020  . Bacteremia 07/19/2020  . Bacterial endocarditis   . BPH (benign prostatic hyperplasia) 07/08/2020  . CAD (coronary artery disease)    a. s/p CABG, AVR, LAA clipping 2015 at [facility].  . Cellulitis of lower extremity 07/08/2020  . Chronic atrial fibrillation (HCC) 07/08/2020  . Chronic combined systolic and diastolic CHF (congestive heart failure) (HCC)   . Cognitive  communication deficit 07/19/2020  . Coronary artery disease involving coronary bypass graft of native heart without angina pectoris 07/19/2020  . Debility 07/19/2020  . 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 Elevated troponin 07/08/2020  . Essential hypertension 07/08/2020  . Generalized abdominal pain   . History of noncompliance with medical treatment   . Hyperlipemia 07/08/2020  . 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  . Malnutrition (HCC)   . MRSA bacteremia   . 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 Normocytic anemia 07/08/2020  . Pressure injury of skin 05/12/2020  . 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 Severe malnutrition (HCC) 07/19/2020  . Thrombocytopenia (HCC) 07/08/2020  . TIA (transient ischemic attack)   . Weight loss 07/19/2020    Patient Active Problem List   Diagnosis Date Noted  . Abscess of jaw 07/19/2020  . Admission for palliative care 07/19/2020  . Aortic valve stenosis 07/19/2020  . Bacteremia 07/19/2020  . Cognitive communication deficit 07/19/2020  . Coronary artery disease involving coronary bypass graft of native heart without angina pectoris 07/19/2020  . Debility 07/19/2020  . 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  . 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 Severe malnutrition (HCC) 07/19/2020  . TIA (transient ischemic attack) 07/19/2020  . Weight loss 07/19/2020  . Acute on chronic heart failure with preserved ejection fraction (HCC) 07/08/2020  . BPH (benign prostatic hyperplasia) 07/08/2020  . Cellulitis of lower extremity  07/08/2020  . 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 Elevated troponin 07/08/2020  . Essential hypertension 07/08/2020  . Hyperlipemia 07/08/2020  . Normocytic anemia 07/08/2020  . Thrombocytopenia (HCC) 07/08/2020  . Chronic atrial fibrillation (HCC) 07/08/2020  . Pressure  injury of skin 05/12/2020  . Generalized abdominal pain     Past Surgical History:  Procedure Laterality Date  . AORTIC VALVE REPLACEMENT    . CORONARY ARTERY BYPASS GRAFT  2015  . HERNIA REPAIR    . REMOVAL OF IMPLANT  03/14/2020  . TEE WITHOUT CARDIOVERSION N/A 05/13/2020   Procedure: TRANSESOPHAGEAL ECHOCARDIOGRAM (TEE);  Surgeon: Lewayne Bunting, MD;  Location: [facility] ENDOSCOPY;  Service: Cardiovascular;  Laterality: N/A;       Family History  Problem Relation Age of Onset  . CAD Neg Hx     Social History   Tobacco Use  . Smoking status: Former Smoker    Packs/day: 3.00    Years: 47.00    Pack years: 141.00    Types: Cigarettes, Cigars    Start date: 1964    Quit date: 07/28/2010    Years since quitting: 10.0  . Smokeless tobacco: Never Used  Substance Use Topics  . Alcohol use: No  . Drug use: No    Home Medications Prior to Admission medications   Medication Sig Start Date End Date Taking? Authorizing Provider  acetaminophen (TYLENOL) 325 MG tablet Take 1,300 mg by mouth every 6 (six) hours as needed for mild pain or moderate pain (back pain).    [provider]  apixaban (ELIQUIS) 5 MG TABS tablet Take 1 tablet (5 mg total) by mouth in the morning and at bedtime. 05/19/20   Lewie Chamber, MD  aspirin 81 MG chewable tablet Chew 81 mg by mouth daily.    [provider]  atorvastatin (LIPITOR) 80 MG tablet Take 1 tablet (80 mg total) by mouth daily. 05/19/20   Lewie Chamber, MD  azithromycin (ZITHROMAX) 250 MG tablet Take 2 tablets by mouth on day 1, followed by 1 tablet by mouth daily for 4 days. Patient not taking: Reported on 08/01/2020 07/07/20   Saguier, [name], PA-C  carvedilol (COREG) 6.25 MG tablet Take 1 tablet (6.25 mg total) by mouth 2 (two) times daily with a meal. 05/19/20   Lewie Chamber, MD  furosemide (LASIX) 40 MG tablet Take 1 tablet (40 mg total) by mouth daily as needed. 06/27/20   Placido Sou, PA-C  Ginkgo Biloba 40 MG TABS Take  1 tablet by mouth daily.    [provider]  HEMP OIL-VANILLYL BUTYL ETHER EX Apply 1 application topically daily.    [provider]  ipratropium (ATROVENT) 0.02 % nebulizer solution Take 2.5 mLs (0.5 mg total) by nebulization in the morning, at noon, and at bedtime. Patient not taking: Reported on 08/01/2020 07/28/20   Hunsucker, Lesia Sago, MD  lisinopril (ZESTRIL) 40 MG tablet Take 1 tablet by mouth daily. 03/20/20 03/20/21  [provider]  losartan (COZAAR) 100 MG tablet Take 1 tablet (100 mg total) by mouth daily. 06/09/20   Saguier, [name], PA-C  losartan (COZAAR) 50 MG tablet Take 1 tablet (50 mg total) by mouth daily. 05/20/20   Lewie Chamber, MD  Multiple Vitamin (MULTIVITAMIN WITH MINERALS) TABS tablet Take 1 tablet by mouth daily.    [provider]  ondansetron (ZOFRAN ODT) 4 MG disintegrating tablet Take 1 tablet (4 mg total) by mouth every 8 (eight) hours as needed for  nausea or vomiting. 07/07/20   Saguier, [name], PA-C  OXYGEN Inhale 2-3 sprays into the lungs every 4 (four) hours as needed (for shortness of breath).    [provider]  potassium chloride SA (KLOR-CON) 20 MEQ tablet Take 20 mEq by mouth daily with breakfast.    [provider]  tamsulosin (FLOMAX) 0.4 MG CAPS capsule Take 1 capsule (0.4 mg total) by mouth daily. 05/19/20   Lewie Chamber, MD  TURMERIC PO Take 1 tablet by mouth daily.    [provider]  ursodiol (ACTIGALL) 300 MG capsule Take 1 capsule (300 mg total) by mouth 2 (two) times daily. 05/19/20   Lewie Chamber, MD    Allergies    Chocolate, Chocolate flavor, and Peanut oil  Review of Systems   Review of Systems  Constitutional:       Per HPI, otherwise negative  HENT:       Per HPI, otherwise negative  Respiratory:       Per HPI, otherwise negative  Cardiovascular:       Per HPI, otherwise negative  Gastrointestinal: Positive for abdominal pain. Negative for vomiting.  Endocrine:        Negative aside from HPI  Genitourinary:       Neg aside from HPI   Musculoskeletal:       Per HPI, otherwise negative  Skin: Negative.   Neurological: Positive for weakness. Negative for syncope.    Physical Exam Updated Vital Signs BP (!) 132/117 (BP Location: Left Arm)   Pulse 62   Temp 97.8 F (36.6 C) (Oral)   Resp (!) 22   Ht 6\' 2"  (1.88 m)   Wt 75.3 kg   SpO2 99%   BMI 21.31 kg/m   Physical Exam Vitals and nursing note reviewed.  Constitutional:      Comments: Deconditioned thin adult male awake and alert breathing quickly, though without extra effort.  HENT:     Head: Normocephalic and atraumatic.  Eyes:     Conjunctiva/sclera: Conjunctivae normal.  Cardiovascular:     Rate and Rhythm: Normal rate and regular rhythm.  Pulmonary:     Effort: Pulmonary effort is normal. Tachypnea present. No respiratory distress.     Breath sounds: No stridor. Decreased breath sounds present.  Abdominal:     General: There is no distension.  Skin:    General: Skin is warm and dry.  Neurological:     Mental Status: He is alert and oriented to person, place, and time.  Psychiatric:        Mood and Affect: Mood is anxious.     ED Results / Procedures / Treatments   Labs (all labs ordered are listed, but only abnormal results are displayed) Labs Reviewed  COMPREHENSIVE METABOLIC PANEL - Abnormal; Notable for the following components:      Result Value   Total Bilirubin 1.9 (*)    All other components within normal limits  CBC WITH DIFFERENTIAL/PLATELET - Abnormal; Notable for the following components:   MCH 24.3 (*)    MCHC 29.6 (*)    RDW 21.3 (*)    All other components within normal limits  BRAIN NATRIURETIC PEPTIDE - Abnormal; Notable for the following components:   B Natriuretic Peptide 1,200.1 (*)    All other components within normal limits  URINALYSIS, ROUTINE W REFLEX MICROSCOPIC - Abnormal; Notable for the following components:   Color, Urine AMBER (*)     Ketones, ur 5 (*)    Protein, ur 100 (*)  All other components within normal limits    EKG EKG Interpretation  Date/Time:  Sunday August 01 2020 19:02:55 EDT Ventricular Rate:  61 PR Interval:    QRS Duration: 106 QT Interval:  475 QTC Calculation: 479 R Axis:   83 Text Interpretation: Atrial fibrillation Ventricular premature complex Borderline right axis deviation Nonspecific T abnormalities, lateral leads Borderline prolonged QT interval Baseline wander Abnormal ECG Confirmed by Gerhard MunchLockwood, Donye Dauenhauer [phone]([id number]) on 08/01/2020 7:06:40 PM   Radiology DG Chest Port 1 View  Result Date: 08/01/2020 CLINICAL DATA:  Shortness of breath EXAM: PORTABLE CHEST 1 VIEW COMPARISON:  07/20/2020 FINDINGS: Cardiac shadow is enlarged. Postsurgical changes are again seen. Aortic calcifications are noted. Right-sided pleural effusion is seen chronic in appearance stable from the prior study. Persistent hazy opacity in the right lung is seen. No new focal infiltrate is noted. IMPRESSION: Stable appearance of the chest when compared with the previous exam. Electronically Signed   By: Alcide CleverMark  Lukens M.D.   On: 08/01/2020 19:31   DG Abdomen Acute W/Chest  Result Date: 08/01/2020 CLINICAL DATA:  Shortness of breath EXAM: DG ABDOMEN ACUTE W/ 1V CHEST COMPARISON:  04/14/2014 FINDINGS: Right pleural effusion with basilar atelectasis. Remote median sternotomy. Trace left pleural effusion. No free intraperitoneal air.  Normal bowel gas pattern. IMPRESSION: 1. Bilateral pleural effusions, right greater than left. Right basilar opacity, atelectasis or infection. 2. Normal bowel gas pattern. Electronically Signed   By: Deatra RobinsonKevin  Herman M.D.   On: 08/01/2020 05:31    Procedures Procedures (including critical care time)  Medications Ordered in ED Medications  fentaNYL (SUBLIMAZE) injection 50 mcg (has no administration in time range)  furosemide (LASIX) injection 60 mg (60 mg Intravenous Given 08/01/20 2135)    ED Course   I have reviewed the triage vital signs and the nursing notes.  Pertinent labs & imaging results that were available during my care of the patient were reviewed by me and considered in my medical decision making (see chart for details).   9:38 PM Patient in no distress, sitting upright.  He requires no oxygen supplementation. Labs reviewed, x-ray reviewed, EKG reviewed, findings consistent with those from earlier in the day, no evidence for acute decompensation since his most recent ED evaluation.  Suspicion for patient's heart failure and medication noncompliance which she acknowledged earlier contributing to his dyspnea.  Abdomen is soft, no vomiting, no fever, and some suspicion for acute on chronic pain in this regard. Patient's daughter called, spoke with nursing about the patient's chronic pain condition.  Patient notes that he has not yet followed up with a pain specialist. He and I discussed the need to do so, he has the ED has limitations on finding chronic pain management services Here with reassuring physical exam, with a notation of his heart failure, the patient will received IV Lasix, 1 dose of analgesia, was discharged to follow-up as an outpatient.   Final Clinical Impression(s) / ED Diagnoses Final diagnoses:  Acute on chronic systolic congestive heart failure (HCC)  Generalized abdominal pain     Gerhard MunchLockwood, Lessie Manigo, MD 08/01/20 2140

## 2020-08-01 NOTE — ED Triage Notes (Signed)
74 yo male BIBA c/o sob. Pt was d/c from ER this morning at 7 am for same complaint. Nothing has changed , but pt state "I dont think I will make it through the night", per EMS. Pt AOx4 per EMS. No other complaints at this time.   Vitals: bp 132/88 Hr 65 rr 16 spo2 95% on RA

## 2020-08-01 NOTE — Telephone Encounter (Signed)
Referral to palliative care after reviewing pulmonologist note.

## 2020-08-01 NOTE — ED Triage Notes (Signed)
Pt reports that he has not been able to catch his breath tonight. He states that he needs oxygen. O2 sats 98% on RA. Reports back pain and "kidney pain" and pain when taking a deep breath. States that he took 4 tylenol PTA without relief. Also endorses nausea. Denies cough. Denies chest pain. States that he had his last COVID shot about a week ago.

## 2020-08-01 NOTE — ED Provider Notes (Signed)
Care assumed from [facility] [place] [facility] at shift change, please see their note for full detail, but in brief Carlos Welch is a 74 y.o. male presents with shortness of breath, no associated CP, cough or fever. Also reports lower abdominal pain recurrent every night when he goes to bed. Also reports some urinary frequency and back pain.  Patient has been seen at multiple emergency departments with similar symptoms, just recently on 10/11 he had a reassuring work-up including CT chest abdomen pelvis.  Work-up today has been very reassuring without leukocytosis, stable hemoglobin and no significant electrolyte derangements.  Initial troponin is negative, lipase is normal.  BNP and urinalysis are pending.  Patient with acute abdominal series which shows known pleural effusions, right lower opacity which was seen on recent CT as well and favored to be compressive atelectasis from pleural effusion appears unchanged, no acute changes noted in the abdomen.  Plan: Follow-up pending studies, anticipate discharge home  BP (!) 129/105   Pulse 63   Temp 98.7 F (37.1 C) (Oral)   Resp 14   Ht 6\' 2"  (1.88 m)   Wt 77.1 kg   SpO2 97%   BMI 21.83 kg/m    ED Course/Procedures   Labs Reviewed  COMPREHENSIVE METABOLIC PANEL - Abnormal; Notable for the following components:      Result Value   Glucose, Bld 107 (*)    Albumin 3.4 (*)    Total Bilirubin 1.4 (*)    All other components within normal limits  CBC WITH DIFFERENTIAL/PLATELET - Abnormal; Notable for the following components:   Hemoglobin 12.6 (*)    MCH 24.8 (*)    RDW 21.2 (*)    All other components within normal limits  BRAIN NATRIURETIC PEPTIDE - Abnormal; Notable for the following components:   B Natriuretic Peptide 1,131.4 (*)    All other components within normal limits  LIPASE, BLOOD  RAPID URINE DRUG SCREEN, HOSP PERFORMED  URINALYSIS, ROUTINE W REFLEX MICROSCOPIC  TROPONIN I (HIGH SENSITIVITY)   DG Abdomen Acute W/Chest  Result Date:  08/01/2020 CLINICAL DATA:  Shortness of breath EXAM: DG ABDOMEN ACUTE W/ 1V CHEST COMPARISON:  04/14/2014 FINDINGS: Right pleural effusion with basilar atelectasis. Remote median sternotomy. Trace left pleural effusion. No free intraperitoneal air.  Normal bowel gas pattern. IMPRESSION: 1. Bilateral pleural effusions, right greater than left. Right basilar opacity, atelectasis or infection. 2. Normal bowel gas pattern. Electronically Signed   By: 04/16/2014 M.D.   On: 08/01/2020 05:31   EKG Interpretation  Date/Time:  Sunday August 01 2020 04:23:35 EDT Ventricular Rate:  65 PR Interval:    QRS Duration: 98 QT Interval:  414 QTC Calculation: 431 R Axis:   84 Text Interpretation: Atrial fibrillation Ventricular premature complex Borderline right axis deviation Anteroseptal infarct, old Nonspecific repol abnormality, inferior leads No significant change was found Confirmed by [id number] (Paula Libra) on 08/01/2020 4:40:30 AM   Procedures  MDM   Patient's urinalysis is clear without signs of infection, pain is improved with treatment here in the ED and patient was ambulatory in the ED, did complain of some back pain after walking, no significant increased work of breathing.  Patient's BNP is elevated from his baseline of 500-600, today is 1,131, but patient does not have signs of pulmonary edema or worsening pleural effusions on x-ray today and lower extremity edema appears consistent with his baseline and is not significantly worsened.  Patient is currently only prescribed Lasix 40 mg daily as needed and he does  not take this daily, will have him take Lasix daily for the next week and then follow closely with his primary care provider for reevaluation.  I have discussed this plan at length with patient and his daughter, and stressed the importance of close outpatient follow-up in particular to discuss pain management as this has been an ongoing pain with multiple reassuring work-ups.  At this  time there does not appear to be any evidence of an acute emergency medical condition and the patient appears stable for discharge with appropriate outpatient follow up. Diagnosis was discussed with patient who verbalizes understanding and is agreeable to discharge.       Dartha Lodge, PA-C 08/01/20 1006    Molpus, John, MD 08/03/20 1248

## 2020-08-01 NOTE — ED Provider Notes (Signed)
[facility]-EMERGENCY DEPT Provider Note   CSN: [id number] Arrival date & time: 08/01/20  0359     History Chief Complaint  Patient presents with  . Shortness of Breath    Carlos Welch is a 74 y.o. male.  Patient with history of atrial fibrillation, sdCHF, BPH, CAD, aortic valve replacement, HTN, HLD presents with complaint of abdominal pain across the lower abdomen and bilateral flank pain. He also reports sob without chest pain. He has had nausea without vomiting. He states he feels that taking a deep breath causes increased abdominal pain and this is the reason for his sob. No cough or fever. He denies changes in BM's and reports his last bowel movement was yesterday morning and was normal. He feels he is urinating more than usual. The symptoms of sob and lower abdominal pain are recurrent and return when he lies down at night to go to sleep.   The history is provided by the patient. No language interpreter was used.  Shortness of Breath Associated symptoms: abdominal pain   Associated symptoms: no chest pain, no cough, no fever and no vomiting        Past Medical History:  Diagnosis Date  . A-fib (HCC)   . Abscess of jaw 07/19/2020  . Acute on chronic heart failure with preserved ejection fraction (HCC) 07/08/2020  . Aortic stenosis    a. s/p pericardial AVR 2015.  Marland Kitchen Aortic valve stenosis 07/19/2020  . Bacteremia 07/19/2020  . Bacterial endocarditis   . BPH (benign prostatic hyperplasia) 07/08/2020  . CAD (coronary artery disease)    a. s/p CABG, AVR, LAA clipping 2015 at [facility].  . Cellulitis of lower extremity 07/08/2020  . Chronic atrial fibrillation (HCC) 07/08/2020  . Chronic combined systolic and diastolic CHF (congestive heart failure) (HCC)   . Cognitive communication deficit 07/19/2020  . Coronary artery disease involving coronary bypass graft of native heart without angina pectoris 07/19/2020  . Debility 07/19/2020  . 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 Elevated troponin 07/08/2020  . Essential hypertension 07/08/2020  . Generalized abdominal pain   . History of noncompliance with medical treatment   . Hyperlipemia 07/08/2020  . 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  . Malnutrition (HCC)   . MRSA bacteremia   . 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 Normocytic anemia 07/08/2020  . Pressure injury of skin 05/12/2020  . 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 Severe malnutrition (HCC) 07/19/2020  . Thrombocytopenia (HCC) 07/08/2020  . TIA (transient ischemic attack)   . Weight loss 07/19/2020    Patient Active Problem List   Diagnosis Date Noted  . Abscess of jaw 07/19/2020  . Admission for palliative care 07/19/2020  . Aortic valve stenosis 07/19/2020  . Bacteremia 07/19/2020  . Cognitive communication deficit 07/19/2020  . Coronary artery disease involving coronary bypass graft of native heart without angina pectoris 07/19/2020  . Debility 07/19/2020  . 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  . 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 Severe malnutrition (HCC) 07/19/2020  . TIA (transient ischemic attack) 07/19/2020  . Weight loss 07/19/2020  . Acute on chronic heart failure with preserved ejection fraction (HCC) 07/08/2020  . BPH (benign prostatic hyperplasia) 07/08/2020  . Cellulitis of lower extremity 07/08/2020  . 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 Elevated troponin 07/08/2020  . Essential hypertension 07/08/2020  . Hyperlipemia 07/08/2020  .  Normocytic anemia 07/08/2020  . Thrombocytopenia (HCC) 07/08/2020  . Chronic atrial fibrillation (HCC) 07/08/2020  . Pressure injury of skin 05/12/2020  . Generalized abdominal pain     Past Surgical History:  Procedure Laterality Date  . AORTIC VALVE REPLACEMENT    . CORONARY ARTERY BYPASS GRAFT  2015  . HERNIA  REPAIR    . REMOVAL OF IMPLANT  03/14/2020  . TEE WITHOUT CARDIOVERSION N/A 05/13/2020   Procedure: TRANSESOPHAGEAL ECHOCARDIOGRAM (TEE);  Surgeon: Lewayne Bunting, MD;  Location: [facility] ENDOSCOPY;  Service: Cardiovascular;  Laterality: N/A;       Family History  Problem Relation Age of Onset  . CAD Neg Hx     Social History   Tobacco Use  . Smoking status: Former Smoker    Packs/day: 3.00    Years: 47.00    Pack years: 141.00    Types: Cigarettes, Cigars    Start date: 1964    Quit date: 07/28/2010    Years since quitting: 10.0  . Smokeless tobacco: Never Used  Substance Use Topics  . Alcohol use: No  . Drug use: No    Home Medications Prior to Admission medications   Medication Sig Start Date End Date Taking? Authorizing Provider  apixaban (ELIQUIS) 5 MG TABS tablet Take 1 tablet (5 mg total) by mouth in the morning and at bedtime. 05/19/20   Lewie Chamber, MD  atorvastatin (LIPITOR) 80 MG tablet Take 1 tablet (80 mg total) by mouth daily. 05/19/20   Lewie Chamber, MD  azithromycin (ZITHROMAX) 250 MG tablet Take 2 tablets by mouth on day 1, followed by 1 tablet by mouth daily for 4 days. 07/07/20   Saguier, [name], PA-C  carvedilol (COREG) 6.25 MG tablet Take 1 tablet (6.25 mg total) by mouth 2 (two) times daily with a meal. 05/19/20   Lewie Chamber, MD  furosemide (LASIX) 40 MG tablet Take 1 tablet (40 mg total) by mouth daily as needed. 06/27/20   Placido Sou, PA-C  Ginkgo Biloba 40 MG TABS Take 1 tablet by mouth daily.    [provider]  HEMP OIL-VANILLYL BUTYL ETHER EX Apply 1 application topically daily.    [provider]  ipratropium (ATROVENT) 0.02 % nebulizer solution Take 2.5 mLs (0.5 mg total) by nebulization in the morning, at noon, and at bedtime. 07/28/20   Hunsucker, Lesia Sago, MD  losartan (COZAAR) 100 MG tablet Take 1 tablet (100 mg total) by mouth daily. 06/09/20   Saguier, [name], PA-C  losartan (COZAAR) 50 MG tablet Take 1 tablet (50 mg  total) by mouth daily. 05/20/20   Lewie Chamber, MD  Multiple Vitamin (MULTIVITAMIN WITH MINERALS) TABS tablet Take 1 tablet by mouth daily.    [provider]  ondansetron (ZOFRAN ODT) 4 MG disintegrating tablet Take 1 tablet (4 mg total) by mouth every 8 (eight) hours as needed for nausea or vomiting. 07/07/20   Saguier, [name], PA-C  tamsulosin (FLOMAX) 0.4 MG CAPS capsule Take 1 capsule (0.4 mg total) by mouth daily. 05/19/20   Lewie Chamber, MD  TURMERIC PO Take 1 tablet by mouth daily.    [provider]  ursodiol (ACTIGALL) 300 MG capsule Take 1 capsule (300 mg total) by mouth 2 (two) times daily. 05/19/20   Lewie Chamber, MD    Allergies    Chocolate flavor and Peanut oil  Review of Systems   Review of Systems  Constitutional: Negative for chills and fever.  HENT: Negative.   Respiratory: Positive for shortness of breath.  Negative for cough.   Cardiovascular: Negative.  Negative for chest pain.  Gastrointestinal: Positive for abdominal pain and nausea. Negative for constipation, diarrhea and vomiting.  Genitourinary: Positive for flank pain and frequency.  Musculoskeletal: Negative for back pain.  Skin: Negative.   Neurological: Negative.  Negative for weakness.  Hematological: Bruises/bleeds easily.    Physical Exam Updated Vital Signs BP (!) 172/124 (BP Location: Right Arm)   Pulse 66   Temp 98.7 F (37.1 C) (Oral)   Resp 18   Ht 6\' 2"  (1.88 m)   Wt 77.1 kg   SpO2 98%   BMI 21.83 kg/m   Physical Exam Vitals and nursing note reviewed.  Constitutional:      Appearance: He is well-developed.     Comments: Unkempt appearance.  HENT:     Head: Atraumatic.  Cardiovascular:     Rate and Rhythm: Normal rate and regular rhythm.  Pulmonary:     Effort: Pulmonary effort is normal.     Breath sounds: Examination of the right-lower field reveals rales. Examination of the left-lower field reveals rales. Rales present. No wheezing.  Abdominal:     General:  There is no distension.     Palpations: Abdomen is soft.     Comments: Minimal tenderness across lower abdomen without guarding or rebound.  Musculoskeletal:        General: Normal range of motion.     Cervical back: Normal range of motion and neck supple.     Comments: Changes of chronic venous stasis to bilateral LE's that involve erythema without warmth. No open blisters or wounds. No bleeding.  Skin:    General: Skin is warm and dry.  Neurological:     General: No focal deficit present.     Mental Status: He is alert and oriented to person, place, and time.  Psychiatric:     Comments: Patient cooperative, calm. Does not make eye contact, keeping eyes closed during entire encounter.     ED Results / Procedures / Treatments   Labs (all labs ordered are listed, but only abnormal results are displayed) Labs Reviewed - No data to display  EKG None  Radiology No results found.  Procedures Procedures (including critical care time)  Medications Ordered in ED Medications - No data to display  ED Course  I have reviewed the triage vital signs and the nursing notes.  Pertinent labs & imaging results that were available during my care of the patient were reviewed by me and considered in my medical decision making (see chart for details).    MDM Rules/Calculators/A&P                         Patient to ED with symptoms of SOB and lower abdominal pain he states are recurrent symptoms. He is chronically ill appearing but nontoxic. VS stable with mild hypertension.   The patient requests something for pain, relaying how uncomfortable he is. Labs and plain film pending.   Chart reviewed. The patient is seen in emergency departments frequently. In the month of October visits included evaluations at [facility], [facility] ED and an office visit to [facility]   Per ED visit [facility] 07/28/20 "Patient verbally aggressive to provider, this RN and EDT in regards  to being dc. ED provider offered tylenol or ibuprofen, patient refused stating he takes at home. Patient screaming for morphine. ED provider advised patient to see his pulmonologist at his scheduled appt today.  Patient continues to be verbally aggressive, stating he will go to another ED for morphine.  "   [facility]:  CT abdomen pelvis impression:  1. No definite acute process identified in the abdomen and pelvis.  2. Mild gallbladder distention. Small amount surrounding fluid as well. Likely passive congestion considering is also ascites and anasarca.  3. Mild ascites.  4. Anasarca.  5. 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   CT chest impression:  1. Bilateral pleural effusions, right greater than left with loculation of the right-sided effusion as well. Adjacent opacities suspected to represent compressive atelectasis, however, aspiration or developing pneumonia is not entirely excluded. Please correlate clinically.  2. Cardiomegaly.  3. Mildly prominent mediastinal lymph nodes which are nonspecific and presumed to be either physiologic or reactive rather than neoplastic. Follow-up recommended to ensure stability/resolution  Labs pending at end of shift. Patient care signed out to [facility] Ford, PA-C. Anticipate patient will be discharged home to outpatient follow up.  Final Clinical Impression(s) / ED Diagnoses Final diagnoses:  None   1. SOB 2. Abdominal pain  Rx / DC Orders ED Discharge Orders    None       Elpidio AnisUpstill, Ladelle Teodoro, PA-C 08/01/20 [id number]    Molpus, Jonny RuizJohn, MD 08/01/20 [phone]

## 2020-08-01 NOTE — Discharge Instructions (Addendum)
Your work-up today was very reassuring we do not see any new changes over your chest or abdomen when compared to the recent CT scans you had, it is extremely important that you follow-up with your primary care provider for further evaluation of your symptoms and pain.  Please begin taking your Lasix fluid pill daily instead of as needed for the next week and then follow-up closely with your primary care doctor for recheck.  If you develop worsening shortness of breath, chest pain, fevers or any other new or concerning symptoms please return to the emergency department.  You can use Tylenol as well as over-the-counter salon pas lidocaine patches (blue and silver box to help treat pain)

## 2020-08-01 NOTE — ED Notes (Signed)
While walking pt complains of weakness in his legs. He was able to walk fine with a walker after walking pt complains of having back pain.

## 2020-08-01 NOTE — ED Notes (Signed)
Patient transported to X-ray 

## 2020-08-02 NOTE — Progress Notes (Addendum)
Cardiology Clinic Note   Patient Name: Carlos Welch Date of Encounter: 08/03/2020  Primary Care Provider:  [name], PA-C Primary Cardiologist:  Thurmon Fair, MD  Patient Profile    Carlos Welch 74 year old presents the clinic today for follow-up evaluation of his atrial fibrillation and chronic CHF.  Past Medical History    Past Medical History:  Diagnosis Date  . A-fib (HCC)   . Abscess of jaw 07/19/2020  . Acute on chronic heart failure with preserved ejection fraction (HCC) 07/08/2020  . Aortic stenosis    a. s/p pericardial AVR 2015.  Marland Kitchen Aortic valve stenosis 07/19/2020  . Bacteremia 07/19/2020  . Bacterial endocarditis   . BPH (benign prostatic hyperplasia) 07/08/2020  . CAD (coronary artery disease)    a. s/p CABG, AVR, LAA clipping 2015 at [facility].  . Cellulitis of lower extremity 07/08/2020  . Chronic atrial fibrillation (HCC) 07/08/2020  . Chronic combined systolic and diastolic CHF (congestive heart failure) (HCC)   . Cognitive communication deficit 07/19/2020  . Coronary artery disease involving coronary bypass graft of native heart without angina pectoris 07/19/2020  . Debility 07/19/2020  . 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 Elevated troponin 07/08/2020  . Essential hypertension 07/08/2020  . Generalized abdominal pain   . History of noncompliance with medical treatment   . Hyperlipemia 07/08/2020  . 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  . Malnutrition (HCC)   . MRSA bacteremia   . 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 Normocytic anemia 07/08/2020  . Pressure injury of skin 05/12/2020  . 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 Severe malnutrition (HCC) 07/19/2020  . Thrombocytopenia (HCC) 07/08/2020  . TIA (transient ischemic attack)   . Weight loss 07/19/2020   Past Surgical History:  Procedure Laterality Date  . AORTIC VALVE REPLACEMENT    . CORONARY ARTERY  BYPASS GRAFT  2015  . HERNIA REPAIR    . REMOVAL OF IMPLANT  03/14/2020  . TEE WITHOUT CARDIOVERSION N/A 05/13/2020   Procedure: TRANSESOPHAGEAL ECHOCARDIOGRAM (TEE);  Surgeon: Lewayne Bunting, MD;  Location: [facility] ENDOSCOPY;  Service: Cardiovascular;  Laterality: N/A;    Allergies  Allergies  Allergen Reactions  . Chocolate Rash  . Chocolate Flavor Rash  . Peanut Oil Rash    History of Present Illness    Mr. Jiron has a PMH of acute on chronic CHF, aortic valve stenosis, HTN, TIA, mitral valve regurgitation, chronic A. fib, and HLD. Aortic stenosis and CAD s/p CABG + pericardial AVR + LAA clipping in 2015  He was seen in emergency department 08/01/2020 for abdominal pain and shortness of breath.  This was his ninth ED visit in 6 months.  He was treated earlier in the day and discharged home.  It was felt that his abdominal pain was chronic and it was not clear whether he was taking this medication or not.  It was also noted that he had been hospitalized recently in [place] and [place] and had an episode of bacteremia, and dyspnea.  He admitted to taking his Lasix.  Patient's daughter was contacted about his chronic pain condition.  Patient has not yet followed up with pain specialist.  He was treated with 1 dose of pain medication and instructed to follow-up with pain management.  He was seen in the hospital August 2021 after a bout of septic shock, shortness of breath and multiple comorbidities.  His mitral valve regurgitation was found to  be moderate to severe by echo with a possibly ruptured chordae and normal LV function.  He was felt to be too high of a risk for mitraclip due to his restricted leaflets.  His medical management was continued.  Blood pressure was well controlled.  A. fib well controlled.  He presents the clinic today for follow-up evaluation states he feels well today.  He presents with his daughter who states that she has been helping with his medication and  monitoring his weight.  She states that he has been taking his furosemide daily due to fluid accumulation.  He is also living with her at this time due to his health needs and she is relocated him from [place].  He has been weighing himself daily and his dry weight is between 166 and 168.  He states that when he gets up to 175 his breathing becomes very short and he notices difficulty breathing and recumbent positions.  We have talked about the importance of restricting salt in the diet, fluid restriction, and compliance with medications.  He states that he does not sleep well unless he is at the hospital and receiving pain medication.  I instructed him to follow-up with his PCP to request sleep aid.  I will give him the salty 6 diet sheet, have him weigh himself daily and contact the office with a weight increase of 3 pounds overnight or 5 pounds in 1 week.  We will order a BMP today and have him follow-up in 3 months.  Today he denies chest pain, shortness of breath, lower extremity edema, fatigue, palpitations, melena, hematuria, hemoptysis, diaphoresis, weakness, presyncope, syncope, orthopnea, and PND.     Home Medications    Prior to Admission medications   Medication Sig Start Date End Date Taking? Authorizing Provider  acetaminophen (TYLENOL) 325 MG tablet Take 1,300 mg by mouth every 6 (six) hours as needed for mild pain or moderate pain (back pain).    [provider]  apixaban (ELIQUIS) 5 MG TABS tablet Take 1 tablet (5 mg total) by mouth in the morning and at bedtime. 05/19/20   Lewie Chamber, MD  aspirin 81 MG chewable tablet Chew 81 mg by mouth daily.    [provider]  atorvastatin (LIPITOR) 80 MG tablet Take 1 tablet (80 mg total) by mouth daily. 05/19/20   Lewie Chamber, MD  azithromycin (ZITHROMAX) 250 MG tablet Take 2 tablets by mouth on day 1, followed by 1 tablet by mouth daily for 4 days. Patient not taking: Reported on 08/01/2020 07/07/20   Saguier,  [name], PA-C  carvedilol (COREG) 6.25 MG tablet Take 1 tablet (6.25 mg total) by mouth 2 (two) times daily with a meal. 05/19/20   Lewie Chamber, MD  furosemide (LASIX) 40 MG tablet Take 1 tablet (40 mg total) by mouth daily as needed. 06/27/20   Placido Sou, PA-C  Ginkgo Biloba 40 MG TABS Take 1 tablet by mouth daily.    [provider]  HEMP OIL-VANILLYL BUTYL ETHER EX Apply 1 application topically daily.    [provider]  ipratropium (ATROVENT) 0.02 % nebulizer solution Take 2.5 mLs (0.5 mg total) by nebulization in the morning, at noon, and at bedtime. Patient not taking: Reported on 08/01/2020 07/28/20   Hunsucker, Lesia Sago, MD         losartan (COZAAR) 100 MG tablet Take 1 tablet (100 mg total) by mouth daily. 06/09/20   Saguier, [name], PA-C         Multiple  Vitamin (MULTIVITAMIN WITH MINERALS) TABS tablet Take 1 tablet by mouth daily.    [provider]  ondansetron (ZOFRAN ODT) 4 MG disintegrating tablet Take 1 tablet (4 mg total) by mouth every 8 (eight) hours as needed for nausea or vomiting. 07/07/20   Saguier, [name], PA-C  OXYGEN Inhale 2-3 sprays into the lungs every 4 (four) hours as needed (for shortness of breath).    [provider]  potassium chloride SA (KLOR-CON) 20 MEQ tablet Take 20 mEq by mouth daily with breakfast.    [provider]  tamsulosin (FLOMAX) 0.4 MG CAPS capsule Take 1 capsule (0.4 mg total) by mouth daily. 05/19/20   Lewie Chamber, MD  TURMERIC PO Take 1 tablet by mouth daily.    [provider]  ursodiol (ACTIGALL) 300 MG capsule Take 1 capsule (300 mg total) by mouth 2 (two) times daily. 05/19/20   Lewie Chamber, MD    Family History    Family History  Problem Relation Age of Onset  . CAD Neg Hx    He indicated that the status of his neg hx is unknown.  Social History    Social History   Socioeconomic History  . Marital status: Divorced    Spouse name: Not on file  . Number of children:  Not on file  . Years of education: Not on file  . Highest education level: Not on file  Occupational History  . Not on file  Tobacco Use  . Smoking status: Former Smoker    Packs/day: 3.00    Years: 47.00    Pack years: 141.00    Types: Cigarettes, Cigars    Start date: 1964    Quit date: 07/28/2010    Years since quitting: 10.0  . Smokeless tobacco: Never Used  Substance and Sexual Activity  . Alcohol use: No  . Drug use: No  . Sexual activity: Not on file  Other Topics Concern  . Not on file  Social History Narrative  . Not on file   Social Determinants of Health   Financial Resource Strain:   . Difficulty of Paying Living Expenses: Not on file  Food Insecurity:   . Worried About Programme researcher, broadcasting/film/video in the Last Year: Not on file  . Ran Out of Food in the Last Year: Not on file  Transportation Needs:   . Lack of Transportation (Medical): Not on file  . Lack of Transportation (Non-Medical): Not on file  Physical Activity:   . Days of Exercise per Week: Not on file  . Minutes of Exercise per Session: Not on file  Stress:   . Feeling of Stress : Not on file  Social Connections:   . Frequency of Communication with Friends and Family: Not on file  . Frequency of Social Gatherings with Friends and Family: Not on file  . Attends Religious Services: Not on file  . Active Member of Clubs or Organizations: Not on file  . Attends Banker Meetings: Not on file  . Marital Status: Not on file  Intimate Partner Violence:   . Fear of Current or Ex-Partner: Not on file  . Emotionally Abused: Not on file  . Physically Abused: Not on file  . Sexually Abused: Not on file     Review of Systems    General:  No chills, fever, night sweats or weight changes.  Cardiovascular:  No chest pain, dyspnea on exertion, edema, orthopnea, palpitations, paroxysmal nocturnal dyspnea. Dermatological: No rash, lesions/masses Respiratory: No  cough, dyspnea Urologic: No hematuria,  dysuria Abdominal:   No nausea, vomiting, diarrhea, bright red blood per rectum, melena, or hematemesis Neurologic:  No visual changes, wkns, changes in mental status. All other systems reviewed and are otherwise negative except as noted above.  Physical Exam    VS:  BP 138/87   Pulse (!) 50   Ht 6\' 2"  (1.88 m)   Wt 168 lb (76.2 kg)   SpO2 100%   BMI 21.57 kg/m  , BMI Body mass index is 21.57 kg/m. GEN: Well nourished, well developed, in no acute distress. HEENT: normal. Neck: Supple, no JVD, carotid bruits, or masses. Cardiac: RRR, no murmurs, rubs, or gallops. No clubbing, cyanosis, edema.  Radials/DP/PT 2+ and equal bilaterally.  Respiratory:  Respirations regular and unlabored, clear to auscultation bilaterally. GI: Soft, nontender, nondistended, BS + x 4. MS: no deformity or atrophy. Skin: warm and dry, no rash. Neuro:  Strength and sensation are intact. Psych: Normal affect.  Accessory Clinical Findings    Recent Labs: 05/13/2020: TSH 1.178 06/16/2020: Magnesium 2.1 08/01/2020: ALT 13; B Natriuretic Peptide 1,200.1; BUN 22; Creatinine, Ser 1.12; Hemoglobin 13.0; Platelets 160; Potassium 3.6; Sodium 139   Recent Lipid Panel No results found for: CHOL, TRIG, HDL, CHOLHDL, VLDL, LDLCALC, LDLDIRECT  ECG personally reviewed by me today-atrial fibrillation with slow ventricular response incomplete left bundle branch block ST and T wave abnormality consider inferolateral ischemia  50 bpm  EKG 06/01/2020 Atrial fibrillation PVCs, borderline right axis deviation with nonspecific T wave abnormality, borderline prolonged QT of 479.  Echocardiogram 05/12/2020 IMPRESSIONS    1. Left ventricular ejection fraction, by estimation, is 55 to 60%. The  left ventricle has normal function. The left ventricle has no regional  wall motion abnormalities. There is moderate concentric left ventricular  hypertrophy. Left ventricular  diastolic parameters are indeterminate.  2. Right  ventricular systolic function is normal. The right ventricular  size is normal. There is moderately elevated pulmonary artery systolic  pressure.  3. Left atrial size was severely dilated.  4. Right atrial size was severely dilated.  5. The mitral valve is normal in structure. Mild mitral valve  regurgitation. No evidence of mitral stenosis.  6. There is thickening of the septal leaflet. The tricuspid valve is  abnormal.  7. Perivalvular thickening around the bioprosthetic aortic valve. The  valve appears to be stable and there is no rocking motion. In the setting  of bacteremia concern for endocarditis/abscess. TEE recommended.. The  aortic valve has been  repaired/replaced. Aortic valve regurgitation is not visualized. No aortic  stenosis is present. There is a unknown bioprosthetic tilting disk valve  present in the aortic position. Procedure Date: 2015. Echo findings are  consistent with normal structure  and function of the aortic valve prosthesis. Aortic valve area, by VTI  measures 1.39 cm. Aortic valve mean gradient measures 10.0 mmHg. Aortic  valve Vmax measures 2.16 m/s.  8. Aortic dilatation noted. There is mild dilatation of the ascending  aorta measuring 38 mm.  9. The inferior vena cava is dilated in size with >50% respiratory  variability, suggesting right atrial pressure of 8 mmHg.    Assessment & Plan   1.  Chronic diastolic heart failure/mitral regurgitation-no increased work of breathing today.  Echocardiogram 05/12/2020 reviewed in the hospital during last admission.  He was felt to not be a candidate for MitraClip due to his restricted leaflets.  Medical management was continued. Continue furosemide, carvedilol, potassium Heart healthy low-sodium diet-salty 6 given  Increase physical activity as tolerated Lower extremity support stockings-Benkelman sheet given Contact office with a weight increase of 3 pounds overnight or 5 pounds in 1 week Fluid  restriction 64 ounces Order BMP today  Essential hypertension-BP today 138/87.  Well-controlled at home Continue carvedilol, losartan Heart healthy low-sodium diet-salty 6 given Increase physical activity as tolerated  Atrial fibrillation-heart rate today 50.  No increased shortness of breath or DOE. Continue carvedilol, Eliquis Heart healthy low-sodium diet-salty 6 given Increase physical activity as tolerated  Disposition: Follow-up with Dr. Royann Shivers in 3 months.  Thomasene Ripple. Alquan Morrish NP-C    08/03/2020, 1:11 PM [facility] [address] Office [phone] Fax [phone]  Notice: This dictation was prepared with Dragon dictation along with smaller phrase technology. Any transcriptional errors that result from this process are unintentional and may not be corrected upon review.

## 2020-08-02 NOTE — Telephone Encounter (Signed)
Dr. Judeth Horn, please see mychart messages and advise.

## 2020-08-03 ENCOUNTER — Encounter: Payer: Self-pay | Admitting: General Practice

## 2020-08-03 ENCOUNTER — Telehealth: Payer: Self-pay | Admitting: Medical

## 2020-08-03 ENCOUNTER — Telehealth: Payer: Self-pay | Admitting: Internal Medicine

## 2020-08-03 ENCOUNTER — Other Ambulatory Visit: Payer: Self-pay

## 2020-08-03 ENCOUNTER — Ambulatory Visit (HOSPITAL_BASED_OUTPATIENT_CLINIC_OR_DEPARTMENT_OTHER)
Admission: RE | Admit: 2020-08-03 | Discharge: 2020-08-03 | Disposition: A | Discharge status: Home / Self Care | Payer: Medicare Other | Source: Ambulatory Visit | Attending: Pulmonary Disease | Admitting: Pulmonary Disease

## 2020-08-03 ENCOUNTER — Ambulatory Visit (INDEPENDENT_AMBULATORY_CARE_PROVIDER_SITE_OTHER): Payer: Medicare Other | Admitting: General Practice

## 2020-08-03 VITALS — BP 138/87 | HR 50 | Ht 74.0 in | Wt 168.0 lb

## 2020-08-03 DIAGNOSIS — I340 Nonrheumatic mitral (valve) insufficiency: Secondary | ICD-10-CM

## 2020-08-03 DIAGNOSIS — R911 Solitary pulmonary nodule: Secondary | ICD-10-CM | POA: Diagnosis present

## 2020-08-03 DIAGNOSIS — I10 Essential (primary) hypertension: Secondary | ICD-10-CM

## 2020-08-03 DIAGNOSIS — I482 Chronic atrial fibrillation, unspecified: Secondary | ICD-10-CM

## 2020-08-03 DIAGNOSIS — I5032 Chronic diastolic (congestive) heart failure: Secondary | ICD-10-CM

## 2020-08-03 LAB — BASIC METABOLIC PANEL
BUN/Creatinine Ratio: 17 (ref 10–24)
BUN: 23 mg/dL (ref 8–27)
CO2: 26 mmol/L (ref 20–29)
Calcium: 9 mg/dL (ref 8.6–10.2)
Chloride: 103 mmol/L (ref 96–106)
Creatinine, Ser: 1.34 mg/dL — ABNORMAL HIGH (ref 0.76–1.27)
GFR calc Af Amer: 61 mL/min/{1.73_m2} (ref >59)
GFR calc non Af Amer: 53 mL/min/{1.73_m2} — ABNORMAL LOW (ref >59)
Glucose: 120 mg/dL — ABNORMAL HIGH (ref 65–99)
Potassium: 3.6 mmol/L (ref 3.5–5.2)
Sodium: 143 mmol/L (ref 134–144)

## 2020-08-03 NOTE — Telephone Encounter (Signed)
Please get pt scheduled for follow up 40 minutes. He has been going to ED a lot.

## 2020-08-03 NOTE — Patient Instructions (Signed)
Medication Instructions:  The current medical regimen is effective;  continue present plan and medications as directed. Please refer to the Current Medication list given to you today. *If you need a refill on your cardiac medications before your next appointment, please call your pharmacy*  Lab Work: BMET TODAY If you have labs (blood work) drawn today and your tests are completely normal, you will receive your results only by:  MyChart Message (if you have MyChart) OR A paper copy in the mail.  If you have any lab test that is abnormal or we need to change your treatment, we will call you to review the results. You may go to any [facility] that is convenient for you however, we do have a lab in our office that is able to assist you. You DO NOT need an appointment for our lab. The lab is open 8:00am and closes at 4:00pm. Lunch 12:45 - 1:45pm.  Special Instructions FLUID RESTRICTION ONLY64 OZ OF ALL FLUIDS DAILY  TAKE AND LOG YOUR WEIGHT DAILY. CALL IF YOUR WEIGHT >3 POUNDS DAILY OR >5 POUNDS IN A WEEK.  PLEASE READ AND FOLLOW SALTY 6-ATTACHED  Follow-Up: Your next appointment:  3 month(s) In Person with Thurmon Fair, MD -OR- JESSE CLEAVER, FNP-C  At [facility], you and your health needs are our priority.  As part of our continuing mission to provide you with exceptional heart care, we have created designated Provider Care Teams.  These Care Teams include your primary Cardiologist (physician) and Advanced Practice Providers (APPs -  Physician Assistants and Nurse Practitioners) who all work together to provide you with the care you need, when you need it.

## 2020-08-03 NOTE — Progress Notes (Signed)
Can you please clarify his meds, though. He has lisinopril and 2 different losartan doses listed

## 2020-08-03 NOTE — Progress Notes (Signed)
Thanks, Carlos Welch, Surprised he came and even more surprised he is doing well.

## 2020-08-03 NOTE — Telephone Encounter (Signed)
Called patient's daughter, Blanchie Serve, to schedule the Palliative Consult, no answer - left message with reason for call along with my name and contact number.

## 2020-08-03 NOTE — Telephone Encounter (Signed)
Pt has an existing appt that was made on 08/01/20 via Mychart for multiple visits to ER for pain and breathing problems.

## 2020-08-04 ENCOUNTER — Telehealth: Payer: Self-pay | Admitting: Pulmonary Disease

## 2020-08-04 ENCOUNTER — Ambulatory Visit: Payer: Medicare Other | Admitting: Medical

## 2020-08-04 ENCOUNTER — Other Ambulatory Visit: Payer: Self-pay

## 2020-08-04 ENCOUNTER — Telehealth: Payer: Self-pay | Admitting: Internal Medicine

## 2020-08-04 DIAGNOSIS — I5033 Acute on chronic diastolic (congestive) heart failure: Secondary | ICD-10-CM

## 2020-08-04 DIAGNOSIS — R0609 Other forms of dyspnea: Secondary | ICD-10-CM

## 2020-08-04 DIAGNOSIS — R0600 Dyspnea, unspecified: Secondary | ICD-10-CM

## 2020-08-04 DIAGNOSIS — Z79899 Other long term (current) drug therapy: Secondary | ICD-10-CM

## 2020-08-04 MED ORDER — FUROSEMIDE 40 MG PO TABS
20.0000 mg | ORAL_TABLET | Freq: Every day | ORAL | 1 refills | Status: DC | PRN
Start: 2020-08-04 — End: 2020-08-13

## 2020-08-04 NOTE — Telephone Encounter (Signed)
Medication name and strength: ipratropium bromide 0.02%sol Provider: Hunsucker Pharmacy: [facility] Patient insurance ID: [id number] Phone: [phone] Fax: [phone]  Was the PA started on CMM?  yes If yes, please enter the Key: [id number] Timeframe for approval/denial: [employer] is reviewing your PA request and will respond within 24 hours for Medicaid or up to 72 hours for non-Medicaid plans, based on the required timeframe determined by state or federal regulations. To check for an update later, open this request from your dashboard.   Will keep encounter open while wait for status update.

## 2020-08-04 NOTE — Telephone Encounter (Signed)
Rec'd message that daughter had returned my call, called daughter back to schedule Palliative Consult, no answer - had to leave another voicemail with my contact information.

## 2020-08-07 ENCOUNTER — Other Ambulatory Visit: Payer: Self-pay

## 2020-08-07 ENCOUNTER — Emergency Department (HOSPITAL_BASED_OUTPATIENT_CLINIC_OR_DEPARTMENT_OTHER): Payer: Medicare Other

## 2020-08-07 ENCOUNTER — Emergency Department (HOSPITAL_BASED_OUTPATIENT_CLINIC_OR_DEPARTMENT_OTHER)
Admission: EM | Admit: 2020-08-07 | Discharge: 2020-08-07 | Disposition: A | Discharge status: Home / Self Care | Payer: Medicare Other | Source: Home / Self Care | Attending: Emergency Medicine | Admitting: Emergency Medicine

## 2020-08-07 ENCOUNTER — Encounter (HOSPITAL_BASED_OUTPATIENT_CLINIC_OR_DEPARTMENT_OTHER): Payer: Self-pay | Admitting: *Deleted

## 2020-08-07 DIAGNOSIS — I110 Hypertensive heart disease with heart failure: Secondary | ICD-10-CM | POA: Insufficient documentation

## 2020-08-07 DIAGNOSIS — I2510 Atherosclerotic heart disease of native coronary artery without angina pectoris: Secondary | ICD-10-CM | POA: Insufficient documentation

## 2020-08-07 DIAGNOSIS — Z91010 Allergy to peanuts: Secondary | ICD-10-CM | POA: Insufficient documentation

## 2020-08-07 DIAGNOSIS — J90 Pleural effusion, not elsewhere classified: Secondary | ICD-10-CM

## 2020-08-07 DIAGNOSIS — Z87891 Personal history of nicotine dependence: Secondary | ICD-10-CM | POA: Insufficient documentation

## 2020-08-07 DIAGNOSIS — Z7982 Long term (current) use of aspirin: Secondary | ICD-10-CM | POA: Insufficient documentation

## 2020-08-07 DIAGNOSIS — Z7901 Long term (current) use of anticoagulants: Secondary | ICD-10-CM | POA: Insufficient documentation

## 2020-08-07 DIAGNOSIS — R0602 Shortness of breath: Secondary | ICD-10-CM | POA: Insufficient documentation

## 2020-08-07 DIAGNOSIS — Z79899 Other long term (current) drug therapy: Secondary | ICD-10-CM | POA: Insufficient documentation

## 2020-08-07 DIAGNOSIS — Z951 Presence of aortocoronary bypass graft: Secondary | ICD-10-CM | POA: Insufficient documentation

## 2020-08-07 DIAGNOSIS — R531 Weakness: Secondary | ICD-10-CM

## 2020-08-07 DIAGNOSIS — R52 Pain, unspecified: Secondary | ICD-10-CM

## 2020-08-07 DIAGNOSIS — Z20822 Contact with and (suspected) exposure to covid-19: Secondary | ICD-10-CM | POA: Insufficient documentation

## 2020-08-07 DIAGNOSIS — I5042 Chronic combined systolic (congestive) and diastolic (congestive) heart failure: Secondary | ICD-10-CM | POA: Insufficient documentation

## 2020-08-07 LAB — COMPREHENSIVE METABOLIC PANEL
ALT: 12 U/L (ref 0–44)
AST: 21 U/L (ref 15–41)
Albumin: 3.4 g/dL — ABNORMAL LOW (ref 3.5–5.0)
Alkaline Phosphatase: 97 U/L (ref 38–126)
Anion gap: 10 (ref 5–15)
BUN: 27 mg/dL — ABNORMAL HIGH (ref 8–23)
CO2: 26 mmol/L (ref 22–32)
Calcium: 8.9 mg/dL (ref 8.9–10.3)
Chloride: 102 mmol/L (ref 98–111)
Creatinine, Ser: 1.61 mg/dL — ABNORMAL HIGH (ref 0.61–1.24)
GFR, Estimated: 45 mL/min — ABNORMAL LOW (ref >60)
Glucose, Bld: 142 mg/dL — ABNORMAL HIGH (ref 70–99)
Potassium: 3.6 mmol/L (ref 3.5–5.1)
Sodium: 138 mmol/L (ref 135–145)
Total Bilirubin: 1.7 mg/dL — ABNORMAL HIGH (ref 0.3–1.2)
Total Protein: 6.9 g/dL (ref 6.5–8.1)

## 2020-08-07 LAB — RESPIRATORY PANEL BY RT PCR (FLU A&B, COVID)
Influenza A by PCR: NEGATIVE
Influenza B by PCR: NEGATIVE
SARS Coronavirus 2 by RT PCR: NEGATIVE

## 2020-08-07 LAB — CBC WITH DIFFERENTIAL/PLATELET
Abs Immature Granulocytes: 0.01 10*3/uL (ref 0.00–0.07)
Basophils Absolute: 0.1 10*3/uL (ref 0.0–0.1)
Basophils Relative: 1 %
Eosinophils Absolute: 0.1 10*3/uL (ref 0.0–0.5)
Eosinophils Relative: 3 %
HCT: 40.7 % (ref 39.0–52.0)
Hemoglobin: 12.3 g/dL — ABNORMAL LOW (ref 13.0–17.0)
Immature Granulocytes: 0 %
Lymphocytes Relative: 22 %
Lymphs Abs: 0.8 10*3/uL (ref 0.7–4.0)
MCH: 24.3 pg — ABNORMAL LOW (ref 26.0–34.0)
MCHC: 30.2 g/dL (ref 30.0–36.0)
MCV: 80.3 fL (ref 80.0–100.0)
Monocytes Absolute: 0.4 10*3/uL (ref 0.1–1.0)
Monocytes Relative: 10 %
Neutro Abs: 2.4 10*3/uL (ref 1.7–7.7)
Neutrophils Relative %: 64 %
Platelets: 142 10*3/uL — ABNORMAL LOW (ref 150–400)
RBC: 5.07 MIL/uL (ref 4.22–5.81)
RDW: 20.9 % — ABNORMAL HIGH (ref 11.5–15.5)
WBC: 3.7 10*3/uL — ABNORMAL LOW (ref 4.0–10.5)
nRBC: 0 % (ref 0.0–0.2)

## 2020-08-07 LAB — URINALYSIS, MICROSCOPIC (REFLEX)
Bacteria, UA: NONE SEEN
Squamous Epithelial / HPF: NONE SEEN (ref 0–5)

## 2020-08-07 LAB — URINALYSIS, ROUTINE W REFLEX MICROSCOPIC
Glucose, UA: NEGATIVE mg/dL
Hgb urine dipstick: NEGATIVE
Ketones, ur: NEGATIVE mg/dL
Leukocytes,Ua: NEGATIVE
Nitrite: NEGATIVE
Protein, ur: 100 mg/dL — AB
Specific Gravity, Urine: >1.03 — ABNORMAL HIGH (ref 1.005–1.030)
pH: 5.5 (ref 5.0–8.0)

## 2020-08-07 LAB — LIPASE, BLOOD: Lipase: 34 U/L (ref 11–51)

## 2020-08-07 MED ORDER — HYDROCODONE-ACETAMINOPHEN 5-325 MG PO TABS
ORAL_TABLET | ORAL | Status: AC
  Filled 2020-08-07: qty 1

## 2020-08-07 MED ORDER — HYDROCODONE-ACETAMINOPHEN 5-325 MG PO TABS
0.5000 | ORAL_TABLET | Freq: Once | ORAL | Status: AC
  Administered 2020-08-07: 0.5 via ORAL
  Filled 2020-08-07: qty 1

## 2020-08-07 MED ORDER — IPRATROPIUM BROMIDE HFA 17 MCG/ACT IN AERS
2.0000 | INHALATION_SPRAY | Freq: Three times a day (TID) | RESPIRATORY_TRACT | Status: DC | PRN
  Administered 2020-08-07: 2 via RESPIRATORY_TRACT
  Filled 2020-08-07: qty 12.9

## 2020-08-07 MED ORDER — IPRATROPIUM BROMIDE 0.02 % IN SOLN
0.5000 mg | Freq: Once | RESPIRATORY_TRACT | Status: AC
  Administered 2020-08-07: 0.5 mg via RESPIRATORY_TRACT
  Filled 2020-08-07: qty 2.5

## 2020-08-07 MED ORDER — HYDROCODONE-ACETAMINOPHEN 5-325 MG PO TABS
0.5000 | ORAL_TABLET | ORAL | 0 refills | Status: DC | PRN
Start: 2020-08-07 — End: 2020-12-10

## 2020-08-07 NOTE — ED Provider Notes (Signed)
[facility] Provider Note: Lowella Dell, MD, FACEP  CSN: [id number] MRN: [id number] ARRIVAL: 08/07/20 at 0019 ROOM: [facility]   CHIEF COMPLAINT  Weakness   HISTORY OF PRESENT ILLNESS  08/07/20 2:22 AM Carlos Welch is a 74 y.o. male with multiple chronic medical conditions.  He is here with 1 day of generalized pain which he describes as severe.  It is more prominent in the abdomen diffusely as well as the back.  He has been taking ibuprofen and Tylenol without relief.  He is also short of breath, worse with exertion.  He denies chest pain, fever, vomiting or diarrhea.  He has had nausea and decreased appetite.   He was seen in the ED twice on 08/01/2020 for similar symptoms.  Work-up was unremarkable and discussion with the patient's daughter suggested the patient has a chronic pain issue but has not followed up with a pain specialist.  Past Medical History:  Diagnosis Date  . A-fib (HCC)   . Abscess of jaw 07/19/2020  . Acute on chronic heart failure with preserved ejection fraction (HCC) 07/08/2020  . Aortic stenosis    a. s/p pericardial AVR 2015.  Marland Kitchen Aortic valve stenosis 07/19/2020  . Bacteremia 07/19/2020  . Bacterial endocarditis   . BPH (benign prostatic hyperplasia) 07/08/2020  . CAD (coronary artery disease)    a. s/p CABG, AVR, LAA clipping 2015 at [facility].  . Cellulitis of lower extremity 07/08/2020  . Chronic atrial fibrillation (HCC) 07/08/2020  . Chronic combined systolic and diastolic CHF (congestive heart failure) (HCC)   . Cognitive communication deficit 07/19/2020  . Coronary artery disease involving coronary bypass graft of native heart without angina pectoris 07/19/2020  . Debility 07/19/2020  . 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 Elevated troponin 07/08/2020  . Essential hypertension 07/08/2020  . Generalized abdominal pain   . History of noncompliance with medical treatment   . Hyperlipemia 07/08/2020  .  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  . Malnutrition (HCC)   . MRSA bacteremia   . 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 Normocytic anemia 07/08/2020  . Pressure injury of skin 05/12/2020  . 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 Severe malnutrition (HCC) 07/19/2020  . Thrombocytopenia (HCC) 07/08/2020  . TIA (transient ischemic attack)   . Weight loss 07/19/2020    Past Surgical History:  Procedure Laterality Date  . AORTIC VALVE REPLACEMENT    . CORONARY ARTERY BYPASS GRAFT  2015  . HERNIA REPAIR    . REMOVAL OF IMPLANT  03/14/2020  . TEE WITHOUT CARDIOVERSION N/A 05/13/2020   Procedure: TRANSESOPHAGEAL ECHOCARDIOGRAM (TEE);  Surgeon: Lewayne Bunting, MD;  Location: [facility] ENDOSCOPY;  Service: Cardiovascular;  Laterality: N/A;    Family History  Problem Relation Age of Onset  . CAD Neg Hx     Social History   Tobacco Use  . Smoking status: Former Smoker    Packs/day: 3.00    Years: 47.00    Pack years: 141.00    Types: Cigarettes, Cigars    Start date: 1964    Quit date: 07/28/2010    Years since quitting: 10.0  . Smokeless tobacco: Never Used  Substance Use Topics  . Alcohol use: No  . Drug use: No    Prior to Admission medications   Medication Sig Start Date End Date Taking? Authorizing Provider  apixaban (ELIQUIS) 5 MG TABS tablet Take 1 tablet (5 mg total) by mouth  in the morning and at bedtime. 05/19/20   Lewie Chamber, MD  aspirin 81 MG chewable tablet Chew 81 mg by mouth as needed.     [provider]  carvedilol (COREG) 6.25 MG tablet Take 1 tablet (6.25 mg total) by mouth 2 (two) times daily with a meal. 05/19/20   Lewie Chamber, MD  furosemide (LASIX) 40 MG tablet Take 0.5 tablets (20 mg total) by mouth daily as needed. 08/04/20   Ronney Asters, NP  Ginkgo Biloba 40 MG TABS Take 1 tablet by mouth daily.    [provider]  HEMP OIL-VANILLYL BUTYL ETHER EX Apply 1 application topically daily.     [provider]  HYDROcodone-acetaminophen (NORCO) 5-325 MG tablet Take 0.5 tablets by mouth every 4 (four) hours as needed (for pain). 08/07/20   Lycan Davee, MD  ipratropium (ATROVENT) 0.02 % nebulizer solution Take 2.5 mLs (0.5 mg total) by nebulization in the morning, at noon, and at bedtime. Patient not taking: Reported on 08/01/2020 07/28/20   Hunsucker, Lesia Sago, MD  losartan (COZAAR) 100 MG tablet Take 1 tablet (100 mg total) by mouth daily. 06/09/20   Saguier, [name], PA-C  Multiple Vitamin (MULTIVITAMIN WITH MINERALS) TABS tablet Take 1 tablet by mouth daily.    [provider]  ondansetron (ZOFRAN ODT) 4 MG disintegrating tablet Take 1 tablet (4 mg total) by mouth every 8 (eight) hours as needed for nausea or vomiting. 07/07/20   Saguier, [name], PA-C  OXYGEN Inhale 2-3 sprays into the lungs every 4 (four) hours as needed (for shortness of breath).    [provider]  potassium chloride SA (KLOR-CON) 20 MEQ tablet Take 20 mEq by mouth daily with breakfast.    [provider]  TURMERIC PO Take 1 tablet by mouth daily.    [provider]    Allergies Chocolate, Chocolate flavor, and Peanut oil   REVIEW OF SYSTEMS  Negative except as noted here or in the History of Present Illness.   PHYSICAL EXAMINATION  Initial Vital Signs Blood pressure (!) 155/126, pulse 65, temperature (!) 96.6 F (35.9 C), temperature source Oral, resp. rate 18, height 6\' 2"  (1.88 m), weight 75.3 kg, SpO2 98 %.  Examination General: Well-developed, well-nourished male in no acute distress; appears older than age of record HENT: normocephalic; atraumatic Eyes: pupils equal, round and reactive to light; extraocular muscles intact Neck: supple Heart: Irregular rhythm; frequent ectopy Lungs: A few basilar rales, primarily on the right Abdomen: soft; nondistended; mild diffuse tenderness; mesh repair palpable in abdominal wall bowel sounds present Extremities:  No deformity; full range of motion; chronic appearing dermatitis of lower legs Neurologic: Awake, alert and oriented; motor function intact in all extremities and symmetric; no facial droop Skin: Warm and dry; pale Psychiatric: Flat affect   RESULTS  Summary of this visit's results, reviewed and interpreted by myself:   EKG Interpretation  Date/Time:  Saturday August 07 2020 02:44:42 EDT Ventricular Rate:  58 PR Interval:    QRS Duration: 107 QT Interval:  446 QTC Calculation: 439 R Axis:   97 Text Interpretation: Atrial fibrillation Right axis deviation Consider left ventricular hypertrophy Probable lateral infarct, age indeterminate Nonspecific T abnrm, anterolateral leads No significant change was found Confirmed by [id number] (Paula Libra) on 08/07/2020 2:58:41 AM      Laboratory Studies: Results for orders placed or performed during the hospital encounter of 08/07/20 (from the past 24 hour(s))  CBC with Differential/Platelet     Status: Abnormal  Collection Time: 08/07/20  2:39 AM  Result Value Ref Range   WBC 3.7 (L) 4.0 - 10.5 K/uL   RBC 5.07 4.22 - 5.81 MIL/uL   Hemoglobin 12.3 (L) 13.0 - 17.0 g/dL   HCT 60.440.7 39 - 52 %   MCV 80.3 80.0 - 100.0 fL   MCH 24.3 (L) 26.0 - 34.0 pg   MCHC 30.2 30.0 - 36.0 g/dL   RDW 54.020.9 (H) 98.111.5 - 19.115.5 %   Platelets 142 (L) 150 - 400 K/uL   nRBC 0.0 0.0 - 0.2 %   Neutrophils Relative % 64 %   Neutro Abs 2.4 1.7 - 7.7 K/uL   Lymphocytes Relative 22 %   Lymphs Abs 0.8 0.7 - 4.0 K/uL   Monocytes Relative 10 %   Monocytes Absolute 0.4 0.1 - 1.0 K/uL   Eosinophils Relative 3 %   Eosinophils Absolute 0.1 0.0 - 0.5 K/uL   Basophils Relative 1 %   Basophils Absolute 0.1 0.0 - 0.1 K/uL   Immature Granulocytes 0 %   Abs Immature Granulocytes 0.01 0.00 - 0.07 K/uL  Comprehensive metabolic panel     Status: Abnormal   Collection Time: 08/07/20  2:39 AM  Result Value Ref Range   Sodium 138 135 - 145 mmol/L   Potassium 3.6 3.5 - 5.1 mmol/L    Chloride 102 98 - 111 mmol/L   CO2 26 22 - 32 mmol/L   Glucose, Bld 142 (H) 70 - 99 mg/dL   BUN 27 (H) 8 - 23 mg/dL   Creatinine, Ser 4.781.61 (H) 0.61 - 1.24 mg/dL   Calcium 8.9 8.9 - 29.510.3 mg/dL   Total Protein 6.9 6.5 - 8.1 g/dL   Albumin 3.4 (L) 3.5 - 5.0 g/dL   AST 21 15 - 41 U/L   ALT 12 0 - 44 U/L   Alkaline Phosphatase 97 38 - 126 U/L   Total Bilirubin 1.7 (H) 0.3 - 1.2 mg/dL   GFR, Estimated 45 (L) >60 mL/min   Anion gap 10 5 - 15  Lipase, blood     Status: None   Collection Time: 08/07/20  2:39 AM  Result Value Ref Range   Lipase 34 11 - 51 U/L  Respiratory Panel by RT PCR (Flu A&B, Covid) - Nasopharyngeal Swab     Status: None   Collection Time: 08/07/20  2:47 AM   Specimen: Nasopharyngeal Swab  Result Value Ref Range   SARS Coronavirus 2 by RT PCR NEGATIVE NEGATIVE   Influenza A by PCR NEGATIVE NEGATIVE   Influenza B by PCR NEGATIVE NEGATIVE  Urinalysis, Routine w reflex microscopic     Status: Abnormal   Collection Time: 08/07/20  3:24 AM  Result Value Ref Range   Color, Urine YELLOW YELLOW   APPearance CLEAR CLEAR   Specific Gravity, Urine >1.030 (H) 1.005 - 1.030   pH 5.5 5.0 - 8.0   Glucose, UA NEGATIVE NEGATIVE mg/dL   Hgb urine dipstick NEGATIVE NEGATIVE   Bilirubin Urine SMALL (A) NEGATIVE   Ketones, ur NEGATIVE NEGATIVE mg/dL   Protein, ur 621100 (A) NEGATIVE mg/dL   Nitrite NEGATIVE NEGATIVE   Leukocytes,Ua NEGATIVE NEGATIVE  Urinalysis, Microscopic (reflex)     Status: None   Collection Time: 08/07/20  3:24 AM  Result Value Ref Range   RBC / HPF 0-5 0 - 5 RBC/hpf   WBC, UA 0-5 0 - 5 WBC/hpf   Bacteria, UA NONE SEEN NONE SEEN   Squamous Epithelial / LPF NONE SEEN 0 -  5   Mucus PRESENT    Hyaline Casts, UA PRESENT    Granular Casts, UA PRESENT    Ca Oxalate Crys, UA PRESENT    Imaging Studies: DG Chest 2 View  Result Date: 08/07/2020 CLINICAL DATA:  Shortness of breath. Bilateral lower extremity swelling. Weakness. Previous smoker. EXAM: CHEST - 2  VIEW COMPARISON:  08/01/2020 FINDINGS: Postoperative changes in the mediastinum. Cardiac enlargement. No vascular congestion. Right pleural effusion with basilar infiltration or atelectasis, unchanged. Degenerative changes in the spine. IMPRESSION: Persistent right pleural effusion with basilar infiltration or atelectasis. Electronically Signed   By: Burman Nieves M.D.   On: 08/07/2020 03:19    ED COURSE and MDM  Nursing notes, initial and subsequent vitals signs, including pulse oximetry, reviewed and interpreted by myself.  Vitals:   08/07/20 0330 08/07/20 0359 08/07/20 0400 08/07/20 0430  BP: (!) 126/113  (!) 134/114 (!) 173/115  Pulse: (!) 51  (!) 56 (!) 57  Resp: 20  15 (!) 7  Temp:      TempSrc:      SpO2: 100% 100% 100% 97%  Weight:      Height:       Medications  ipratropium (ATROVENT HFA) inhaler 2 puff (has no administration in time range)  HYDROcodone-acetaminophen (NORCO/VICODIN) 5-325 MG per tablet 0.5 tablet (has no administration in time range)  ipratropium (ATROVENT) nebulizer solution 0.5 mg (0.5 mg Nebulization Given 08/07/20 0358)   4:48 AM The patient got some relief after an Atrovent neb treatment.  He had been prescribed Atrovent but had difficulty obtaining a home nebulizer machine.  The patient gets episodes where he feels dyspneic.  These occur as frequently as every 15 minutes but not necessarily every day.  He describes this is a sensation in his diaphragm of pain or difficulty pulling in air.  He has been observed on the monitor and by respiratory therapy and has episodes of brief apnea like periods but not obstructive in nature.  His daughter is willing to trial a low-dose narcotic as the patient states he has slept well during past hospitalizations when he was given morphine.  She will dispense his medication only as prescribed.  We will trial this for several days and she will contact his primary care physician next week.  I do not see any indication for  admission at this time.  His right pleural effusion is stable and his laboratory studies are within normal limits for him.   PROCEDURES  Procedures   ED DIAGNOSES     ICD-10-CM   1. Shortness of breath  R06.02   2. Generalized pain  R52   3. Generalized weakness  R53.1   4. Pleural effusion on right  J90        Carlos Welch, Carlos Ruiz, MD 08/07/20 [phone]

## 2020-08-07 NOTE — ED Triage Notes (Addendum)
C/o generalized body pain/ weakness   With " severe abd pain " denies n.v.d, recent admit to hospital for same . Pt is pale in color and

## 2020-08-08 ENCOUNTER — Encounter (HOSPITAL_COMMUNITY): Payer: Self-pay | Admitting: Emergency Medicine

## 2020-08-08 ENCOUNTER — Other Ambulatory Visit: Payer: Self-pay

## 2020-08-08 ENCOUNTER — Emergency Department (HOSPITAL_COMMUNITY): Payer: Medicare Other

## 2020-08-08 ENCOUNTER — Inpatient Hospital Stay (HOSPITAL_COMMUNITY)
Admission: EM | Admit: 2020-08-08 | Discharge: 2020-08-13 | DRG: 291 | Disposition: A | Discharge status: Home Health | Payer: Medicare Other | Attending: Family Medicine | Admitting: Family Medicine

## 2020-08-08 DIAGNOSIS — I728 Aneurysm of other specified arteries: Secondary | ICD-10-CM | POA: Diagnosis present

## 2020-08-08 DIAGNOSIS — D61818 Other pancytopenia: Secondary | ICD-10-CM | POA: Diagnosis present

## 2020-08-08 DIAGNOSIS — D649 Anemia, unspecified: Secondary | ICD-10-CM | POA: Diagnosis present

## 2020-08-08 DIAGNOSIS — I5033 Acute on chronic diastolic (congestive) heart failure: Secondary | ICD-10-CM | POA: Diagnosis present

## 2020-08-08 DIAGNOSIS — N179 Acute kidney failure, unspecified: Secondary | ICD-10-CM | POA: Diagnosis present

## 2020-08-08 DIAGNOSIS — I472 Ventricular tachycardia: Secondary | ICD-10-CM | POA: Diagnosis not present

## 2020-08-08 DIAGNOSIS — Z7901 Long term (current) use of anticoagulants: Secondary | ICD-10-CM

## 2020-08-08 DIAGNOSIS — Z7982 Long term (current) use of aspirin: Secondary | ICD-10-CM

## 2020-08-08 DIAGNOSIS — I2510 Atherosclerotic heart disease of native coronary artery without angina pectoris: Secondary | ICD-10-CM | POA: Diagnosis present

## 2020-08-08 DIAGNOSIS — I10 Essential (primary) hypertension: Secondary | ICD-10-CM | POA: Diagnosis present

## 2020-08-08 DIAGNOSIS — I25810 Atherosclerosis of coronary artery bypass graft(s) without angina pectoris: Secondary | ICD-10-CM | POA: Diagnosis present

## 2020-08-08 DIAGNOSIS — D696 Thrombocytopenia, unspecified: Secondary | ICD-10-CM | POA: Diagnosis present

## 2020-08-08 DIAGNOSIS — D638 Anemia in other chronic diseases classified elsewhere: Secondary | ICD-10-CM | POA: Diagnosis present

## 2020-08-08 DIAGNOSIS — Y712 Prosthetic and other implants, materials and accessory cardiovascular devices associated with adverse incidents: Secondary | ICD-10-CM | POA: Diagnosis present

## 2020-08-08 DIAGNOSIS — R17 Unspecified jaundice: Secondary | ICD-10-CM | POA: Diagnosis present

## 2020-08-08 DIAGNOSIS — E785 Hyperlipidemia, unspecified: Secondary | ICD-10-CM | POA: Diagnosis present

## 2020-08-08 DIAGNOSIS — R54 Age-related physical debility: Secondary | ICD-10-CM | POA: Diagnosis present

## 2020-08-08 DIAGNOSIS — I872 Venous insufficiency (chronic) (peripheral): Secondary | ICD-10-CM | POA: Diagnosis present

## 2020-08-08 DIAGNOSIS — Z6821 Body mass index (BMI) 21.0-21.9, adult: Secondary | ICD-10-CM

## 2020-08-08 DIAGNOSIS — R7989 Other specified abnormal findings of blood chemistry: Secondary | ICD-10-CM | POA: Diagnosis present

## 2020-08-08 DIAGNOSIS — R0600 Dyspnea, unspecified: Secondary | ICD-10-CM

## 2020-08-08 DIAGNOSIS — I2729 Other secondary pulmonary hypertension: Secondary | ICD-10-CM | POA: Diagnosis present

## 2020-08-08 DIAGNOSIS — Z8614 Personal history of Methicillin resistant Staphylococcus aureus infection: Secondary | ICD-10-CM

## 2020-08-08 DIAGNOSIS — I712 Thoracic aortic aneurysm, without rupture: Secondary | ICD-10-CM | POA: Diagnosis present

## 2020-08-08 DIAGNOSIS — Z79899 Other long term (current) drug therapy: Secondary | ICD-10-CM

## 2020-08-08 DIAGNOSIS — K8020 Calculus of gallbladder without cholecystitis without obstruction: Secondary | ICD-10-CM | POA: Diagnosis present

## 2020-08-08 DIAGNOSIS — N400 Enlarged prostate without lower urinary tract symptoms: Secondary | ICD-10-CM | POA: Diagnosis present

## 2020-08-08 DIAGNOSIS — J9601 Acute respiratory failure with hypoxia: Secondary | ICD-10-CM | POA: Diagnosis present

## 2020-08-08 DIAGNOSIS — Z8673 Personal history of transient ischemic attack (TIA), and cerebral infarction without residual deficits: Secondary | ICD-10-CM

## 2020-08-08 DIAGNOSIS — I110 Hypertensive heart disease with heart failure: Principal | ICD-10-CM | POA: Diagnosis present

## 2020-08-08 DIAGNOSIS — R64 Cachexia: Secondary | ICD-10-CM | POA: Diagnosis present

## 2020-08-08 DIAGNOSIS — G8929 Other chronic pain: Secondary | ICD-10-CM | POA: Diagnosis present

## 2020-08-08 DIAGNOSIS — I482 Chronic atrial fibrillation, unspecified: Secondary | ICD-10-CM | POA: Diagnosis present

## 2020-08-08 DIAGNOSIS — I340 Nonrheumatic mitral (valve) insufficiency: Secondary | ICD-10-CM | POA: Diagnosis present

## 2020-08-08 DIAGNOSIS — I081 Rheumatic disorders of both mitral and tricuspid valves: Secondary | ICD-10-CM | POA: Diagnosis present

## 2020-08-08 DIAGNOSIS — E876 Hypokalemia: Secondary | ICD-10-CM | POA: Diagnosis present

## 2020-08-08 DIAGNOSIS — I4821 Permanent atrial fibrillation: Secondary | ICD-10-CM | POA: Diagnosis present

## 2020-08-08 DIAGNOSIS — Z953 Presence of xenogenic heart valve: Secondary | ICD-10-CM

## 2020-08-08 DIAGNOSIS — I509 Heart failure, unspecified: Secondary | ICD-10-CM

## 2020-08-08 DIAGNOSIS — Z87891 Personal history of nicotine dependence: Secondary | ICD-10-CM

## 2020-08-08 DIAGNOSIS — T82897A Other specified complication of cardiac prosthetic devices, implants and grafts, initial encounter: Secondary | ICD-10-CM | POA: Diagnosis present

## 2020-08-08 DIAGNOSIS — R600 Localized edema: Secondary | ICD-10-CM | POA: Diagnosis present

## 2020-08-08 DIAGNOSIS — Z20822 Contact with and (suspected) exposure to covid-19: Secondary | ICD-10-CM | POA: Diagnosis present

## 2020-08-08 LAB — HEPATIC FUNCTION PANEL
ALT: 12 U/L (ref 0–44)
AST: 25 U/L (ref 15–41)
Albumin: 3.1 g/dL — ABNORMAL LOW (ref 3.5–5.0)
Alkaline Phosphatase: 96 U/L (ref 38–126)
Bilirubin, Direct: 0.6 mg/dL — ABNORMAL HIGH (ref 0.0–0.2)
Indirect Bilirubin: 1.4 mg/dL — ABNORMAL HIGH (ref 0.3–0.9)
Total Bilirubin: 2 mg/dL — ABNORMAL HIGH (ref 0.3–1.2)
Total Protein: 6.6 g/dL (ref 6.5–8.1)

## 2020-08-08 LAB — BASIC METABOLIC PANEL
Anion gap: 11 (ref 5–15)
BUN: 24 mg/dL — ABNORMAL HIGH (ref 8–23)
CO2: 22 mmol/L (ref 22–32)
Calcium: 8.7 mg/dL — ABNORMAL LOW (ref 8.9–10.3)
Chloride: 105 mmol/L (ref 98–111)
Creatinine, Ser: 1.51 mg/dL — ABNORMAL HIGH (ref 0.61–1.24)
GFR, Estimated: 49 mL/min — ABNORMAL LOW (ref >60)
Glucose, Bld: 96 mg/dL (ref 70–99)
Potassium: 3.8 mmol/L (ref 3.5–5.1)
Sodium: 138 mmol/L (ref 135–145)

## 2020-08-08 LAB — CBC
HCT: 42.4 % (ref 39.0–52.0)
Hemoglobin: 12.4 g/dL — ABNORMAL LOW (ref 13.0–17.0)
MCH: 24 pg — ABNORMAL LOW (ref 26.0–34.0)
MCHC: 29.2 g/dL — ABNORMAL LOW (ref 30.0–36.0)
MCV: 82 fL (ref 80.0–100.0)
Platelets: 127 10*3/uL — ABNORMAL LOW (ref 150–400)
RBC: 5.17 MIL/uL (ref 4.22–5.81)
RDW: 20.8 % — ABNORMAL HIGH (ref 11.5–15.5)
WBC: 4 10*3/uL (ref 4.0–10.5)
nRBC: 0 % (ref 0.0–0.2)

## 2020-08-08 LAB — LIPASE, BLOOD: Lipase: 37 U/L (ref 11–51)

## 2020-08-08 LAB — PROTIME-INR
INR: 2.6 — ABNORMAL HIGH (ref 0.8–1.2)
Prothrombin Time: 27.3 seconds — ABNORMAL HIGH (ref 11.4–15.2)

## 2020-08-08 LAB — TROPONIN I (HIGH SENSITIVITY)
Troponin I (High Sensitivity): 13 ng/L (ref <18)
Troponin I (High Sensitivity): 12 ng/L (ref <18)

## 2020-08-08 LAB — SEDIMENTATION RATE: Sed Rate: 0 mm/hr (ref 0–16)

## 2020-08-08 LAB — BRAIN NATRIURETIC PEPTIDE: B Natriuretic Peptide: 1288.7 pg/mL — ABNORMAL HIGH (ref 0.0–100.0)

## 2020-08-08 MED ORDER — HYDROCODONE-ACETAMINOPHEN 5-325 MG PO TABS
0.5000 | ORAL_TABLET | ORAL | Status: DC | PRN
  Administered 2020-08-08 – 2020-08-13 (×6): 0.5 via ORAL
  Filled 2020-08-08 (×6): qty 1

## 2020-08-08 MED ORDER — IPRATROPIUM BROMIDE 0.02 % IN SOLN
0.5000 mg | RESPIRATORY_TRACT | Status: DC
  Administered 2020-08-08 – 2020-08-12 (×9): 0.5 mg via RESPIRATORY_TRACT
  Filled 2020-08-08 (×9): qty 2.5

## 2020-08-08 MED ORDER — ACETAMINOPHEN 325 MG PO TABS: 650.0000 mg | ORAL_TABLET | Freq: Four times a day (QID) | ORAL | Status: DC | PRN

## 2020-08-08 MED ORDER — LOSARTAN POTASSIUM 50 MG PO TABS
100.0000 mg | ORAL_TABLET | Freq: Every day | ORAL | Status: DC
  Administered 2020-08-09 – 2020-08-13 (×5): 100 mg via ORAL
  Filled 2020-08-08 (×5): qty 2

## 2020-08-08 MED ORDER — SODIUM CHLORIDE 0.9% FLUSH
3.0000 mL | Freq: Two times a day (BID) | INTRAVENOUS | Status: DC
  Administered 2020-08-08 – 2020-08-12 (×8): 3 mL via INTRAVENOUS

## 2020-08-08 MED ORDER — POTASSIUM CHLORIDE CRYS ER 20 MEQ PO TBCR
20.0000 meq | EXTENDED_RELEASE_TABLET | Freq: Every day | ORAL | Status: DC
  Administered 2020-08-09: 20 meq via ORAL
  Filled 2020-08-08: qty 1

## 2020-08-08 MED ORDER — FUROSEMIDE 10 MG/ML IJ SOLN
40.0000 mg | Freq: Once | INTRAMUSCULAR | Status: AC
  Administered 2020-08-08: 40 mg via INTRAVENOUS
  Filled 2020-08-08: qty 4

## 2020-08-08 MED ORDER — ACETAMINOPHEN 650 MG RE SUPP: 650.0000 mg | Freq: Four times a day (QID) | RECTAL | Status: DC | PRN

## 2020-08-08 MED ORDER — CARVEDILOL 6.25 MG PO TABS
6.2500 mg | ORAL_TABLET | Freq: Two times a day (BID) | ORAL | Status: DC
  Administered 2020-08-09 – 2020-08-13 (×8): 6.25 mg via ORAL
  Filled 2020-08-08 (×8): qty 1

## 2020-08-08 MED ORDER — APIXABAN 5 MG PO TABS
5.0000 mg | ORAL_TABLET | Freq: Two times a day (BID) | ORAL | Status: DC
  Administered 2020-08-09 – 2020-08-13 (×9): 5 mg via ORAL
  Filled 2020-08-08 (×9): qty 1

## 2020-08-08 MED ORDER — FUROSEMIDE 10 MG/ML IJ SOLN
40.0000 mg | Freq: Every day | INTRAMUSCULAR | Status: DC
  Administered 2020-08-09 – 2020-08-10 (×2): 40 mg via INTRAVENOUS
  Filled 2020-08-08 (×2): qty 4

## 2020-08-08 NOTE — H&P (Signed)
History and Physical   Carlos Welch OFH:[id number] DOB: 1946/05/03 DOA: 08/08/2020  PCP: [name], PA-C   Patient coming from: Home  Chief Complaint: Shortness of breath  HPI: Carlos Welch is a 74 y.o. male with medical history significant of heart failure with preserved ejection fraction, aortic valve stenosis status post valve replacement, BPH, A. fib, CAD status post CABG, hypertension, thrombocytopenia, TIA, and mitral valve regurgitation who presents with several days of worsening shortness of breath.  Patient states his symptoms started 3 days ago he endorses shortness of breath and chronic abdominal discomfort.  His symptoms worsened yesterday at which time he was seen at [facility] and was provided Atrovent with some improvement in symptoms.  His symptoms continued to worsen so he came to the post current ED today.  He reports orthopnea and dyspnea on exertion.  Per chart review dry rate is around 166 and his weight here is 164 unclear if this is variability due to scales or if he has a new dry weight.  He additionally endorses chronic abdominal pain.  He denies constipation, diarrhea, fevers, cough.   ED Course: Vital signs stable in ED, but are significant for pulse in the 50s.  Lab work showed mild AKI with creatinine of 1.5 from a baseline of about 1.1.  Also noted was chronic stable anemia at 12.4 and thrombocytopenia of 127.  He does have elevation of indirect bilirubin to 1.4.  Troponins were were flat at 13 and 12.  Lipase was negative sed rate was 0.  PT 27 and INR 2.6.  BNP was elevated greater than 1200.  Respiratory panel was obtained.  Patient received a dose of Lasix 40 mg in the ED.  Review of Systems: As per HPI otherwise all other systems reviewed and are negative.  Past Medical History:  Diagnosis Date  . A-fib (HCC)   . Abscess of jaw 07/19/2020  . Acute on chronic heart failure with preserved ejection fraction (HCC) 07/08/2020  . Aortic stenosis    a. s/p pericardial  AVR 2015.  Marland Kitchen Aortic valve stenosis 07/19/2020  . Bacteremia 07/19/2020  . Bacterial endocarditis   . BPH (benign prostatic hyperplasia) 07/08/2020  . CAD (coronary artery disease)    a. s/p CABG, AVR, LAA clipping 2015 at [facility].  . Cellulitis of lower extremity 07/08/2020  . Chronic atrial fibrillation (HCC) 07/08/2020  . Chronic combined systolic and diastolic CHF (congestive heart failure) (HCC)   . Cognitive communication deficit 07/19/2020  . Coronary artery disease involving coronary bypass graft of native heart without angina pectoris 07/19/2020  . Debility 07/19/2020  . 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 Elevated troponin 07/08/2020  . Essential hypertension 07/08/2020  . Generalized abdominal pain   . History of noncompliance with medical treatment   . Hyperlipemia 07/08/2020  . 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  . Malnutrition (HCC)   . MRSA bacteremia   . 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 Normocytic anemia 07/08/2020  . Pressure injury of skin 05/12/2020  . 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 Severe malnutrition (HCC) 07/19/2020  . Thrombocytopenia (HCC) 07/08/2020  . TIA (transient ischemic attack)   . Weight loss 07/19/2020    Past Surgical History:  Procedure Laterality Date  . AORTIC VALVE REPLACEMENT    . CORONARY ARTERY BYPASS GRAFT  2015  . HERNIA REPAIR    . REMOVAL OF IMPLANT  03/14/2020  . TEE WITHOUT CARDIOVERSION  N/A 05/13/2020   Procedure: TRANSESOPHAGEAL ECHOCARDIOGRAM (TEE);  Surgeon: Lewayne Bunting, MD;  Location: [facility] ENDOSCOPY;  Service: Cardiovascular;  Laterality: N/A;    Social History  reports that he quit smoking about 10 years ago. His smoking use included cigarettes and cigars. He started smoking about 57 years ago. He has a 141.00 pack-year smoking history. He has never used smokeless tobacco. He reports that he does not drink alcohol and does  not use drugs.  Allergies  Allergen Reactions  . Chocolate Rash  . Chocolate Flavor Rash  . Peanut Oil Rash    Family History  Problem Relation Age of Onset  . CAD Neg Hx   Reviewed on admission  Prior to Admission medications   Medication Sig Start Date End Date Taking? Authorizing Provider  apixaban (ELIQUIS) 5 MG TABS tablet Take 1 tablet (5 mg total) by mouth in the morning and at bedtime. 05/19/20  Yes Lewie Chamber, MD  aspirin 81 MG chewable tablet Chew 81 mg by mouth daily as needed for moderate pain.    Yes [provider]  carvedilol (COREG) 6.25 MG tablet Take 1 tablet (6.25 mg total) by mouth 2 (two) times daily with a meal. 05/19/20  Yes Lewie Chamber, MD  furosemide (LASIX) 40 MG tablet Take 0.5 tablets (20 mg total) by mouth daily as needed. Patient taking differently: Take 20 mg by mouth daily as needed for fluid.  08/04/20  Yes Cleaver, Thomasene Ripple, NP  Ginkgo Biloba 40 MG TABS Take 1 tablet by mouth daily.   Yes [provider]  HEMP OIL-VANILLYL BUTYL ETHER EX Apply 1 application topically daily.   Yes [provider]  HYDROcodone-acetaminophen (NORCO) 5-325 MG tablet Take 0.5 tablets by mouth every 4 (four) hours as needed (for pain). 08/07/20  Yes Molpus, John, MD  ipratropium (ATROVENT) 0.02 % nebulizer solution Take 2.5 mLs (0.5 mg total) by nebulization in the morning, at noon, and at bedtime. 07/28/20  Yes Hunsucker, Lesia Sago, MD  losartan (COZAAR) 100 MG tablet Take 1 tablet (100 mg total) by mouth daily. 06/09/20  Yes Saguier, [name], PA-C  Multiple Vitamin (MULTIVITAMIN WITH MINERALS) TABS tablet Take 1 tablet by mouth daily.   Yes [provider]  ondansetron (ZOFRAN ODT) 4 MG disintegrating tablet Take 1 tablet (4 mg total) by mouth every 8 (eight) hours as needed for nausea or vomiting. 07/07/20  Yes Saguier, [name], PA-C  OXYGEN Inhale 2-3 sprays into the lungs every 4 (four) hours as needed (for shortness of breath).   Yes  [provider]  potassium chloride SA (KLOR-CON) 20 MEQ tablet Take 20 mEq by mouth daily with breakfast.   Yes [provider]  TURMERIC PO Take 1 tablet by mouth daily.   Yes [provider]    Physical Exam: Vitals:   08/08/20 2022 08/08/20 2041 08/08/20 2215 08/08/20 2300  BP: (!) 147/119  (!) 157/121 (!) 162/112  Pulse: 63  (!) 38 (!) 47  Resp: (!) 9  10 (!) 22  Temp: 97.6 F (36.4 C)     TempSrc: Oral     SpO2: 100%  94% 100%  Weight:  74.5 kg    Height:  6\' 2"  (1.88 m)     Physical Exam Constitutional:      General: He is not in acute distress.    Appearance: Normal appearance.     Comments: Chronically ill-appearing  HENT:     Head: Normocephalic and atraumatic.     Mouth/Throat:  Mouth: Mucous membranes are moist.     Pharynx: Oropharynx is clear.  Eyes:     Extraocular Movements: Extraocular movements intact.     Pupils: Pupils are equal, round, and reactive to light.  Cardiovascular:     Rate and Rhythm: Normal rate and regular rhythm.     Pulses: Normal pulses.     Heart sounds: Normal heart sounds.  Pulmonary:     Effort: Pulmonary effort is normal. No respiratory distress.     Breath sounds: Rales (Bilateral) present.  Abdominal:     General: Bowel sounds are normal. There is no distension.     Palpations: Abdomen is soft.     Tenderness: There is abdominal tenderness.  Musculoskeletal:        General: No swelling or deformity.     Right lower leg: Edema (Pedal) present.     Left lower leg: Edema (Pedal) present.  Skin:    General: Skin is warm and dry.  Neurological:     General: No focal deficit present.     Mental Status: Mental status is at baseline.    Labs on Admission: I have personally reviewed following labs and imaging studies  CBC: Recent Labs  Lab 08/07/20 0239 08/08/20 2027  WBC 3.7* 4.0  NEUTROABS 2.4  --   HGB 12.3* 12.4*  HCT 40.7 42.4  MCV 80.3 82.0  PLT 142* 127*    Basic Metabolic  Panel: Recent Labs  Lab 08/03/20 1103 08/07/20 0239 08/08/20 2027  NA 143 138 138  K 3.6 3.6 3.8  CL 103 102 105  CO2 26 26 22   GLUCOSE 120* 142* 96  BUN 23 27* 24*  CREATININE 1.34* 1.61* 1.51*  CALCIUM 9.0 8.9 8.7*    GFR: Estimated Creatinine Clearance: 46.6 mL/min (A) (by C-G formula based on SCr of 1.51 mg/dL (H)).  Liver Function Tests: Recent Labs  Lab 08/07/20 0239 08/08/20 2120  AST 21 25  ALT 12 12  ALKPHOS 97 96  BILITOT 1.7* 2.0*  PROT 6.9 6.6  ALBUMIN 3.4* 3.1*    Urine analysis:    Component Value Date/Time   COLORURINE YELLOW 08/07/2020 0324   APPEARANCEUR CLEAR 08/07/2020 0324   LABSPEC >1.030 (H) 08/07/2020 0324   PHURINE 5.5 08/07/2020 0324   GLUCOSEU NEGATIVE 08/07/2020 0324   HGBUR NEGATIVE 08/07/2020 0324   BILIRUBINUR SMALL (A) 08/07/2020 0324   KETONESUR NEGATIVE 08/07/2020 0324   PROTEINUR 100 (A) 08/07/2020 0324   UROBILINOGEN 0.2 04/14/2014 1108   NITRITE NEGATIVE 08/07/2020 0324   LEUKOCYTESUR NEGATIVE 08/07/2020 0324    Radiological Exams on Admission: DG Chest 2 View  Result Date: 08/08/2020 CLINICAL DATA:  Chest pain. EXAM: CHEST - 2 VIEW COMPARISON:  August 07, 2020 FINDINGS: The lungs are hyperinflated. Multiple sternal wires are seen. Stable diffusely increased interstitial lung markings are seen. Persistent mild to moderate severity right basilar atelectasis and/or infiltrate is noted. There is a stable small to moderate size right pleural effusion. No pneumothorax is identified. The cardiac silhouette is markedly enlarged and unchanged in size. The visualized skeletal structures are unremarkable. IMPRESSION: 1. Stable mild to moderate severity right basilar atelectasis and/or infiltrate. 2. Stable small to moderate size right pleural effusion. Electronically Signed   By: Aram Candelahaddeus  Houston M.D.   On: 08/08/2020 22:14   DG Chest 2 View  Result Date: 08/07/2020 CLINICAL DATA:  Shortness of breath. Bilateral lower extremity  swelling. Weakness. Previous smoker. EXAM: CHEST - 2 VIEW COMPARISON:  08/01/2020 FINDINGS: Postoperative changes  in the mediastinum. Cardiac enlargement. No vascular congestion. Right pleural effusion with basilar infiltration or atelectasis, unchanged. Degenerative changes in the spine. IMPRESSION: Persistent right pleural effusion with basilar infiltration or atelectasis. Electronically Signed   By: Burman Nieves M.D.   On: 08/07/2020 03:19    EKG: Independently reviewed.  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  BNP greater than 1200 on admission. > Bilateral lower lobe rales and bilateral pedal edema on exam. > Unclear trigger at this time.  Per last cardiology note he was noted to have an echo showing severe mitral regurgitation during a hospital stay in [place] but I could not immediately find these records.  He was deemed not a candidate for MitraClip at that time per chart review. - Continue Lasix 40 mg IV daily, adjust based on response - Repeat echocardiogram - Continue home carvedilol, losartan - Continue home potassium chloride 20 mEq daily - I/Os, Daily weights - Trend BMP  A. Fib Hypertension Mitral valve regurgitation CAD status post CABG:  Aortic valve stenosis status post valve replacement > Heart rate in the 50s to 60 in ED - Repeat echocardiogram - Continue home carvedilol, losartan - Continue home  eliquis  Chronic pain > Chronic condition, is working to arrange appointment with pain clinic. - Continue newly prescribed Norco 5-325, 0.5 tablets every 4 hours as needed  Anemia Thrombocytopenia > Anemia chronic and stable > Thrombocytopenia chronic, PLT 127 on admission - Monitor CBC  DVT prophylaxis: Eliquis  Code Status:   Full  Family Communication:  None on admission  Disposition Plan:   Patient is from:  Home  Anticipated DC to:  Home  Anticipated DC date:  Pending clinical course    Consults called:  None  Admission status:  Observation, telemetry   Severity of Illness: The appropriate patient status for this patient is OBSERVATION. Observation status is judged to be reasonable and necessary in order to provide the required intensity of service to ensure the patient's safety. The patient's presenting symptoms, physical exam findings, and initial radiographic and laboratory data in the context of their medical condition is felt to place them at decreased risk for further clinical deterioration. Furthermore, it is anticipated that the patient will be medically stable for discharge from the hospital within 2 midnights of admission. The following factors support the patient status of observation.   " The patient's presenting symptoms include dyspnea. " The physical exam findings include bilateral rales. " The initial radiographic and laboratory data are consistent with acute CHF exacerbation.   Synetta Fail MD [facility]  How to contact the [facility] Attending or Consulting provider 7A - 7P or covering provider during after hours 7P -7A, for this patient?   1. Check the care team in [facility] and look for a) attending/consulting TR[facility] provider listed and b) the [facility] team listed 2. Log into [email or web address] and use [place]'s universal password to access. If you do not have the password, please contact the hospital  operator. 3. Locate the [facility] provider you are looking for  under [facility] and page to a number that you can be directly reached. 4. If you still have difficulty reaching the provider, please page the [facility] (Director on Call) for the Hospitalists listed on amion for assistance.  08/09/2020, 12:11 AM

## 2020-08-08 NOTE — ED Notes (Signed)
Patient transported to X-ray 

## 2020-08-08 NOTE — ED Triage Notes (Addendum)
Patient arrived with EMS from home reports intermittent central chest pain with SOB this week , patient added low abdominal pain , no emesis or diaphoresis . History of CHF , denies fever or chills . He received ASA 324 mg by EMS prior to arrival .

## 2020-08-08 NOTE — ED Provider Notes (Signed)
[facility] EMERGENCY DEPARTMENT Provider Note   CSN: [id number] Arrival date & time: 08/08/20  1952     History Chief Complaint  Patient presents with  . Chest Pain    Carlos Welch is a 74 y.o. male.  74 year old male with history of chronic CHF as well as A. fib presents with shortness of breath and abdominal discomfort. States that he has abdominal pain that goes up into his chest. Patient's been seen multiple times in the last several weeks for similar symptoms. Denies any fever cough or congestion. States symptoms worse with lying flat and better with sitting up. Does note increased dyspnea on exertion. Denies any new leg pain or swelling. No new treatments used for this prior to arrival        Past Medical History:  Diagnosis Date  . A-fib (HCC)   . Abscess of jaw 07/19/2020  . Acute on chronic heart failure with preserved ejection fraction (HCC) 07/08/2020  . Aortic stenosis    a. s/p pericardial AVR 2015.  Marland Kitchen Aortic valve stenosis 07/19/2020  . Bacteremia 07/19/2020  . Bacterial endocarditis   . BPH (benign prostatic hyperplasia) 07/08/2020  . CAD (coronary artery disease)    a. s/p CABG, AVR, LAA clipping 2015 at [facility].  . Cellulitis of lower extremity 07/08/2020  . Chronic atrial fibrillation (HCC) 07/08/2020  . Chronic combined systolic and diastolic CHF (congestive heart failure) (HCC)   . Cognitive communication deficit 07/19/2020  . Coronary artery disease involving coronary bypass graft of native heart without angina pectoris 07/19/2020  . Debility 07/19/2020  . 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 Elevated troponin 07/08/2020  . Essential hypertension 07/08/2020  . Generalized abdominal pain   . History of noncompliance with medical treatment   . Hyperlipemia 07/08/2020  . 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  . Malnutrition (HCC)   . MRSA bacteremia   . Nonrheumatic mitral valve  regurgitation 07/19/2020  . Normocytic anemia 07/08/2020  . Pressure injury of skin 05/12/2020  . 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 Severe malnutrition (HCC) 07/19/2020  . Thrombocytopenia (HCC) 07/08/2020  . TIA (transient ischemic attack)   . Weight loss 07/19/2020    Patient Active Problem List   Diagnosis Date Noted  . Abscess of jaw 07/19/2020  . Admission for palliative care 07/19/2020  . Aortic valve stenosis 07/19/2020  . Bacteremia 07/19/2020  . Cognitive communication deficit 07/19/2020  . Coronary artery disease involving coronary bypass graft of native heart without angina pectoris 07/19/2020  . Debility 07/19/2020  . 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  . 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 Severe malnutrition (HCC) 07/19/2020  . TIA (transient ischemic attack) 07/19/2020  . Weight loss 07/19/2020  . Acute on chronic heart failure with preserved ejection fraction (HCC) 07/08/2020  . BPH (benign prostatic hyperplasia) 07/08/2020  . Cellulitis of lower extremity 07/08/2020  . 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 Elevated troponin 07/08/2020  . Essential hypertension 07/08/2020  . Hyperlipemia 07/08/2020  . Normocytic anemia 07/08/2020  . Thrombocytopenia (HCC) 07/08/2020  . Chronic atrial fibrillation (HCC) 07/08/2020  . Pressure injury of skin 05/12/2020  . Generalized abdominal pain     Past Surgical History:  Procedure Laterality Date  . AORTIC VALVE REPLACEMENT    . CORONARY ARTERY BYPASS GRAFT  2015  . HERNIA REPAIR    . REMOVAL OF IMPLANT  03/14/2020  . TEE WITHOUT CARDIOVERSION N/A 05/13/2020   Procedure: TRANSESOPHAGEAL ECHOCARDIOGRAM (TEE);  Surgeon: Lewayne Bunting, MD;  Location: [facility] ENDOSCOPY;  Service: Cardiovascular;  Laterality: N/A;       Family History  Problem Relation Age of Onset  . CAD Neg Hx     Social History   Tobacco Use  .  Smoking status: Former Smoker    Packs/day: 3.00    Years: 47.00    Pack years: 141.00    Types: Cigarettes, Cigars    Start date: 1964    Quit date: 07/28/2010    Years since quitting: 10.0  . Smokeless tobacco: Never Used  Substance Use Topics  . Alcohol use: No  . Drug use: No    Home Medications Prior to Admission medications   Medication Sig Start Date End Date Taking? Authorizing Provider  apixaban (ELIQUIS) 5 MG TABS tablet Take 1 tablet (5 mg total) by mouth in the morning and at bedtime. 05/19/20   Lewie Chamber, MD  aspirin 81 MG chewable tablet Chew 81 mg by mouth as needed.     [provider]  carvedilol (COREG) 6.25 MG tablet Take 1 tablet (6.25 mg total) by mouth 2 (two) times daily with a meal. 05/19/20   Lewie Chamber, MD  furosemide (LASIX) 40 MG tablet Take 0.5 tablets (20 mg total) by mouth daily as needed. 08/04/20   Ronney Asters, NP  Ginkgo Biloba 40 MG TABS Take 1 tablet by mouth daily.    [provider]  HEMP OIL-VANILLYL BUTYL ETHER EX Apply 1 application topically daily.    [provider]  HYDROcodone-acetaminophen (NORCO) 5-325 MG tablet Take 0.5 tablets by mouth every 4 (four) hours as needed (for pain). 08/07/20   Molpus, John, MD  ipratropium (ATROVENT) 0.02 % nebulizer solution Take 2.5 mLs (0.5 mg total) by nebulization in the morning, at noon, and at bedtime. Patient not taking: Reported on 08/01/2020 07/28/20   Hunsucker, Lesia Sago, MD  losartan (COZAAR) 100 MG tablet Take 1 tablet (100 mg total) by mouth daily. 06/09/20   Saguier, [name], PA-C  Multiple Vitamin (MULTIVITAMIN WITH MINERALS) TABS tablet Take 1 tablet by mouth daily.    [provider]  ondansetron (ZOFRAN ODT) 4 MG disintegrating tablet Take 1 tablet (4 mg total) by mouth every 8 (eight) hours as needed for nausea or vomiting. 07/07/20   Saguier, [name], PA-C  OXYGEN Inhale 2-3 sprays into the lungs every 4 (four) hours as needed (for shortness of  breath).    [provider]  potassium chloride SA (KLOR-CON) 20 MEQ tablet Take 20 mEq by mouth daily with breakfast.    [provider]  TURMERIC PO Take 1 tablet by mouth daily.    [provider]    Allergies    Chocolate, Chocolate flavor, and Peanut oil  Review of Systems   Review of Systems  All other systems reviewed and are negative.   Physical Exam Updated Vital Signs BP (!) 147/119 (BP Location: Right Arm)   Pulse 63   Temp 97.6 F (36.4 C) (Oral)   Resp (!) 9   Ht 1.88 m (6\' 2" )   Wt 74.5 kg   SpO2 100%   BMI 21.09 kg/m   Physical Exam Vitals and nursing note reviewed.  Constitutional:      General: He is not in acute distress.    Appearance: Normal appearance. He is well-developed. He is not toxic-appearing.  HENT:  Head: Normocephalic and atraumatic.  Eyes:     General: Lids are normal.     Conjunctiva/sclera: Conjunctivae normal.     Pupils: Pupils are equal, round, and reactive to light.  Neck:     Thyroid: No thyroid mass.     Trachea: No tracheal deviation.  Cardiovascular:     Rate and Rhythm: Normal rate and regular rhythm.     Heart sounds: Normal heart sounds. No murmur heard.  No gallop.   Pulmonary:     Effort: Pulmonary effort is normal. No respiratory distress.     Breath sounds: No stridor. Decreased breath sounds present. No wheezing, rhonchi or rales.  Abdominal:     General: Bowel sounds are normal. There is no distension.     Palpations: Abdomen is soft.     Tenderness: There is no abdominal tenderness. There is no rebound.  Musculoskeletal:        General: No tenderness. Normal range of motion.     Cervical back: Normal range of motion and neck supple.  Skin:    General: Skin is warm and dry.     Findings: No abrasion or rash.  Neurological:     Mental Status: He is alert and oriented to person, place, and time.     GCS: GCS eye subscore is 4. GCS verbal subscore is 5. GCS motor subscore is 6.      Cranial Nerves: No cranial nerve deficit.     Sensory: No sensory deficit.  Psychiatric:        Speech: Speech normal.        Behavior: Behavior normal.     ED Results / Procedures / Treatments   Labs (all labs ordered are listed, but only abnormal results are displayed) Labs Reviewed  BASIC METABOLIC PANEL  CBC  PROTIME-INR  LIPASE, BLOOD  SEDIMENTATION RATE  TROPONIN I (HIGH SENSITIVITY)    EKG EKG Interpretation  Date/Time:  Sunday August 08 2020 20:09:08 EDT Ventricular Rate:  60 PR Interval:    QRS Duration: 112 QT Interval:  462 QTC Calculation: 462 R Axis:   77 Text Interpretation: Atrial fibrillation Septal infarct , age undetermined ST & T wave abnormality, consider inferolateral ischemia Abnormal ECG Confirmed by Lorre Nick ([id number]) on 08/08/2020 8:33:49 PM   Radiology DG Chest 2 View  Result Date: 08/07/2020 CLINICAL DATA:  Shortness of breath. Bilateral lower extremity swelling. Weakness. Previous smoker. EXAM: CHEST - 2 VIEW COMPARISON:  08/01/2020 FINDINGS: Postoperative changes in the mediastinum. Cardiac enlargement. No vascular congestion. Right pleural effusion with basilar infiltration or atelectasis, unchanged. Degenerative changes in the spine. IMPRESSION: Persistent right pleural effusion with basilar infiltration or atelectasis. Electronically Signed   By: Burman Nieves M.D.   On: 08/07/2020 03:19    Procedures Procedures (including critical care time)  Medications Ordered in ED Medications - No data to display  ED Course  I have reviewed the triage vital signs and the nursing notes.  Pertinent labs & imaging results that were available during my care of the patient were reviewed by me and considered in my medical decision making (see chart for details).    MDM Rules/Calculators/A&P                          Patient's abdominal work-up including lipase and LFTs without significant findings.  He has no leukocytosis on his CBC.   Patient however does have fluid on his lungs as well as elevated BNP.  He has a new oxygen required.  Suspect patient having CHF exacerbation.  Given Lasix here.  Will admit to the hospitalist service  CRITICAL CARE Performed by: Toy BakerAnthony T Ellijah Leffel Total critical care time: 45 minutes Critical care time was exclusive of separately billable procedures and treating other patients. Critical care was necessary to treat or prevent imminent or life-threatening deterioration. Critical care was time spent personally by me on the following activities: development of treatment plan with patient and/or surrogate as well as nursing, discussions with consultants, evaluation of patient's response to treatment, examination of patient, obtaining history from patient or surrogate, ordering and performing treatments and interventions, ordering and review of laboratory studies, ordering and review of radiographic studies, pulse oximetry and re-evaluation of patient's condition.  Final Clinical Impression(s) / ED Diagnoses Final diagnoses:  None    Rx / DC Orders ED Discharge Orders    None       Lorre NickAllen, Samarrah Tranchina, MD 08/08/20 2227

## 2020-08-08 NOTE — Care Management (Signed)
ED CM noted patient has had 11 ED visits in the past 6 months with 1 resulting in an admission.   Patient is active with Cardiology, [facility], [facility] [place] and [facility] and has been in contact as per notes in chart. ED evaluation still in progress. CM will continue to follow for possible discharge needs

## 2020-08-09 ENCOUNTER — Observation Stay (HOSPITAL_COMMUNITY): Payer: Medicare Other

## 2020-08-09 ENCOUNTER — Telehealth: Payer: Self-pay | Admitting: Internal Medicine

## 2020-08-09 ENCOUNTER — Encounter (HOSPITAL_COMMUNITY): Payer: Self-pay | Admitting: Internal Medicine

## 2020-08-09 DIAGNOSIS — I25810 Atherosclerosis of coronary artery bypass graft(s) without angina pectoris: Secondary | ICD-10-CM | POA: Diagnosis present

## 2020-08-09 DIAGNOSIS — R64 Cachexia: Secondary | ICD-10-CM | POA: Diagnosis present

## 2020-08-09 DIAGNOSIS — R7989 Other specified abnormal findings of blood chemistry: Secondary | ICD-10-CM | POA: Diagnosis present

## 2020-08-09 DIAGNOSIS — N179 Acute kidney failure, unspecified: Secondary | ICD-10-CM | POA: Diagnosis present

## 2020-08-09 DIAGNOSIS — I472 Ventricular tachycardia: Secondary | ICD-10-CM | POA: Diagnosis not present

## 2020-08-09 DIAGNOSIS — I872 Venous insufficiency (chronic) (peripheral): Secondary | ICD-10-CM | POA: Diagnosis present

## 2020-08-09 DIAGNOSIS — I340 Nonrheumatic mitral (valve) insufficiency: Secondary | ICD-10-CM | POA: Diagnosis not present

## 2020-08-09 DIAGNOSIS — I361 Nonrheumatic tricuspid (valve) insufficiency: Secondary | ICD-10-CM | POA: Diagnosis not present

## 2020-08-09 DIAGNOSIS — D638 Anemia in other chronic diseases classified elsewhere: Secondary | ICD-10-CM | POA: Diagnosis present

## 2020-08-09 DIAGNOSIS — R17 Unspecified jaundice: Secondary | ICD-10-CM | POA: Diagnosis present

## 2020-08-09 DIAGNOSIS — I2729 Other secondary pulmonary hypertension: Secondary | ICD-10-CM | POA: Diagnosis present

## 2020-08-09 DIAGNOSIS — N400 Enlarged prostate without lower urinary tract symptoms: Secondary | ICD-10-CM | POA: Diagnosis present

## 2020-08-09 DIAGNOSIS — I2510 Atherosclerotic heart disease of native coronary artery without angina pectoris: Secondary | ICD-10-CM | POA: Diagnosis present

## 2020-08-09 DIAGNOSIS — G8929 Other chronic pain: Secondary | ICD-10-CM | POA: Diagnosis present

## 2020-08-09 DIAGNOSIS — J9601 Acute respiratory failure with hypoxia: Secondary | ICD-10-CM | POA: Diagnosis present

## 2020-08-09 DIAGNOSIS — T82897A Other specified complication of cardiac prosthetic devices, implants and grafts, initial encounter: Secondary | ICD-10-CM | POA: Diagnosis present

## 2020-08-09 DIAGNOSIS — I5033 Acute on chronic diastolic (congestive) heart failure: Secondary | ICD-10-CM | POA: Diagnosis present

## 2020-08-09 DIAGNOSIS — I712 Thoracic aortic aneurysm, without rupture: Secondary | ICD-10-CM | POA: Diagnosis present

## 2020-08-09 DIAGNOSIS — Z7901 Long term (current) use of anticoagulants: Secondary | ICD-10-CM | POA: Diagnosis not present

## 2020-08-09 DIAGNOSIS — Z20822 Contact with and (suspected) exposure to covid-19: Secondary | ICD-10-CM | POA: Diagnosis present

## 2020-08-09 DIAGNOSIS — I482 Chronic atrial fibrillation, unspecified: Secondary | ICD-10-CM | POA: Diagnosis not present

## 2020-08-09 DIAGNOSIS — I110 Hypertensive heart disease with heart failure: Secondary | ICD-10-CM | POA: Diagnosis not present

## 2020-08-09 DIAGNOSIS — I509 Heart failure, unspecified: Secondary | ICD-10-CM | POA: Diagnosis not present

## 2020-08-09 DIAGNOSIS — D61818 Other pancytopenia: Secondary | ICD-10-CM | POA: Diagnosis present

## 2020-08-09 DIAGNOSIS — I4821 Permanent atrial fibrillation: Secondary | ICD-10-CM | POA: Diagnosis present

## 2020-08-09 DIAGNOSIS — E876 Hypokalemia: Secondary | ICD-10-CM | POA: Diagnosis present

## 2020-08-09 DIAGNOSIS — R54 Age-related physical debility: Secondary | ICD-10-CM | POA: Diagnosis present

## 2020-08-09 DIAGNOSIS — I081 Rheumatic disorders of both mitral and tricuspid valves: Secondary | ICD-10-CM | POA: Diagnosis present

## 2020-08-09 DIAGNOSIS — Y712 Prosthetic and other implants, materials and accessory cardiovascular devices associated with adverse incidents: Secondary | ICD-10-CM | POA: Diagnosis present

## 2020-08-09 LAB — ECHOCARDIOGRAM COMPLETE
AR max vel: 1.75 cm2
AV Area VTI: 1.79 cm2
AV Area mean vel: 1.7 cm2
AV Mean grad: 6 mmHg
AV Peak grad: 11.9 mmHg
Ao pk vel: 1.73 m/s
Height: 74 in
S' Lateral: 4.1 cm
Weight: 2663.16 oz

## 2020-08-09 LAB — RESPIRATORY PANEL BY RT PCR (FLU A&B, COVID)
Influenza A by PCR: NEGATIVE
Influenza B by PCR: NEGATIVE
SARS Coronavirus 2 by RT PCR: NEGATIVE

## 2020-08-09 LAB — GLUCOSE, CAPILLARY: Glucose-Capillary: 102 mg/dL — ABNORMAL HIGH (ref 70–99)

## 2020-08-09 LAB — CBC
HCT: 40.4 % (ref 39.0–52.0)
Hemoglobin: 12.1 g/dL — ABNORMAL LOW (ref 13.0–17.0)
MCH: 23.8 pg — ABNORMAL LOW (ref 26.0–34.0)
MCHC: 30 g/dL (ref 30.0–36.0)
MCV: 79.4 fL — ABNORMAL LOW (ref 80.0–100.0)
Platelets: 129 10*3/uL — ABNORMAL LOW (ref 150–400)
RBC: 5.09 MIL/uL (ref 4.22–5.81)
RDW: 20.5 % — ABNORMAL HIGH (ref 11.5–15.5)
WBC: 3.3 10*3/uL — ABNORMAL LOW (ref 4.0–10.5)
nRBC: 0 % (ref 0.0–0.2)

## 2020-08-09 LAB — BASIC METABOLIC PANEL
Anion gap: 10 (ref 5–15)
BUN: 23 mg/dL (ref 8–23)
CO2: 26 mmol/L (ref 22–32)
Calcium: 8.6 mg/dL — ABNORMAL LOW (ref 8.9–10.3)
Chloride: 104 mmol/L (ref 98–111)
Creatinine, Ser: 1.33 mg/dL — ABNORMAL HIGH (ref 0.61–1.24)
GFR, Estimated: 57 mL/min — ABNORMAL LOW (ref >60)
Glucose, Bld: 88 mg/dL (ref 70–99)
Potassium: 3.1 mmol/L — ABNORMAL LOW (ref 3.5–5.1)
Sodium: 140 mmol/L (ref 135–145)

## 2020-08-09 LAB — MAGNESIUM: Magnesium: 1.8 mg/dL (ref 1.7–2.4)

## 2020-08-09 MED ORDER — PANTOPRAZOLE SODIUM 40 MG PO TBEC
40.0000 mg | DELAYED_RELEASE_TABLET | Freq: Every day | ORAL | Status: DC
  Administered 2020-08-09 – 2020-08-13 (×5): 40 mg via ORAL
  Filled 2020-08-09 (×5): qty 1

## 2020-08-09 MED ORDER — POTASSIUM CHLORIDE CRYS ER 20 MEQ PO TBCR
40.0000 meq | EXTENDED_RELEASE_TABLET | Freq: Every day | ORAL | Status: DC
  Administered 2020-08-09 – 2020-08-13 (×5): 40 meq via ORAL
  Filled 2020-08-09 (×5): qty 2

## 2020-08-09 MED ORDER — ALUM & MAG HYDROXIDE-SIMETH 200-200-20 MG/5ML PO SUSP
30.0000 mL | ORAL | Status: DC | PRN
  Administered 2020-08-09: 30 mL via ORAL
  Filled 2020-08-09: qty 30

## 2020-08-09 MED ORDER — PANTOPRAZOLE SODIUM 40 MG PO TBEC: 40.0000 mg | DELAYED_RELEASE_TABLET | Freq: Every day | ORAL | Status: DC

## 2020-08-09 MED ORDER — PERFLUTREN LIPID MICROSPHERE
1.0000 mL | INTRAVENOUS | Status: AC | PRN
  Administered 2020-08-09: 5 mL via INTRAVENOUS
  Filled 2020-08-09: qty 10

## 2020-08-09 NOTE — Progress Notes (Signed)
  Echocardiogram 2D Echocardiogram has been performed.  Gerda Diss 08/09/2020, 10:56 AM

## 2020-08-09 NOTE — Progress Notes (Signed)
Patient refused labs. He stated that his blood has been drawn from his IV since he has been here and would not allow the lab to stick him. I tried to explain to the patient and he became quite agitated.

## 2020-08-09 NOTE — Progress Notes (Signed)
PROGRESS NOTE    Carlos Welch  RWE:[id number] DOB: Jul 10, 1946 DOA: 08/08/2020 PCP: [name], PA-C    Chief Complaint  Patient presents with  . Chest Pain    Brief Narrative:  74 year old gentleman prior history of diastolic heart failure, aortic valve stenosis s/p valve replacement, atrial fibrillation on Eliquis, coronary artery disease s/p CABG, hypertension, thrombocytopenia, severe mitral valve regurgitation, history of TIA, BPH presents to the ED for worsening shortness of breath, worsening pedal edema, orthopnea, dyspnea on exertion, weight gain, elevated BNP.  He was admitted for acute on chronic diastolic heart failure exacerbation.  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 Fluid and salt restriction. Obtain echocardiogram Continue with Coreg and losartan and.     History of atrial fibrillation, rate controlled on Eliquis for anticoagulation and on Coreg for rate control.    History of coronary artery disease s/p CABG Aortic valve stenosis s/p AV replacement. Severe mitral valve regurgitation Patient currently denies any chest pain, continue with Eliquis, Coreg and losartan.  Repeat echocardiogram ordered for further evaluation.     Anemia of chronic disease Continue to monitor hemoglobin Chronic thrombocytopenia Monitor.     Essential hypertension Blood pressure parameters are  suboptimally controlled Restart the home medication repeat blood pressure at this afternoon and adjust medications accordingly. .   Hypokalemia Replaced    AKI Probably secondary to CHF On admission patient's creatinine was 1.6, improved to 1.3 diuresis.    DVT prophylaxis: Eliquis code Status: Full: Family Communication: None at bedside Disposition:   Status is: Observation  The patient will require care spanning > 2 midnights and should be moved to inpatient because: IV treatments appropriate due to intensity of illness or inability to take PO  Dispo: The patient is from: Home              Anticipated d/c is to: pending.               Anticipated d/c date is: 2 days              Patient currently is not medically stable to d/c.       Consultants:   None.     Procedures: echocardiogram.  Antimicrobials: none.   Subjective: Substernal burning sensation.   Objective: Vitals:   08/09/20 0300 08/09/20 0649 08/09/20 0820 08/09/20 0822  BP: (!) 147/109  (!) 171/115   Pulse: (!) 47 (!) 58 62   Resp: 15 12 15    Temp: 97.8 F (36.6 C)  97.6 F (36.4 C)   TempSrc: Oral  Oral   SpO2: 99% 99% 96% 97%  Weight:  75.5 kg    Height:        Intake/Output Summary (Last 24 hours) at 08/09/2020 1156 Last data filed at 08/09/2020 1028 Gross per 24 hour  Intake --  Output 860 ml  Net -860 ml   Filed Weights   08/08/20 2000 08/08/20 2041 08/09/20 0649  Weight: 75 kg  74.5 kg 75.5 kg    Examination:  General exam: Appears calm and comfortable  Respiratory system: Clear to auscultation. Respiratory effort normal. Cardiovascular system: S1 & S2 heard, RRR. No JVD, LEG EDEMA present.  Gastrointestinal system: Abdomen is nondistended, soft and nontender.  Normal bowel sounds heard. Central nervous system: Alert and oriented. No focal neurological deficits. Extremities: Symmetric 5 x 5 power. Skin: No rashes, lesions or ulcers Psychiatry:  Mood & affect  appropriate.     Data Reviewed: I have personally reviewed following labs and imaging studies  CBC: Recent Labs  Lab 08/07/20 0239 08/08/20 2027 08/09/20 0701  WBC 3.7* 4.0 3.3*  NEUTROABS 2.4  --   --   HGB 12.3* 12.4* 12.1*  HCT 40.7 42.4 40.4  MCV 80.3 82.0 79.4*  PLT 142* 127* 129*    Basic Metabolic Panel: Recent Labs  Lab 08/03/20 1103 08/07/20 0239 08/08/20 2027 08/09/20 0701  NA 143 138 138 140  K 3.6 3.6 3.8 3.1*  CL 103 102 105 104  CO2 26 26 22 26   GLUCOSE 120* 142* 96 88  BUN 23 27* 24* 23  CREATININE 1.34* 1.61* 1.51* 1.33*  CALCIUM 9.0 8.9 8.7* 8.6*  MG  --   --   --  1.8    GFR: Estimated Creatinine Clearance: 53.6 mL/min (A) (by C-G formula based on SCr of 1.33 mg/dL (H)).  Liver Function Tests: Recent Labs  Lab 08/07/20 0239 08/08/20 2120  AST 21 25  ALT 12 12  ALKPHOS 97 96  BILITOT 1.7* 2.0*  PROT 6.9 6.6  ALBUMIN 3.4* 3.1*    CBG: No results for input(s): GLUCAP in the last 168 hours.   Recent Results (from the past 240 hour(s))  Respiratory Panel by RT PCR (Flu A&B, Covid) - Nasopharyngeal Swab     Status: None   Collection Time: 08/07/20  2:47 AM   Specimen: Nasopharyngeal Swab  Result Value Ref Range Status   SARS Coronavirus 2 by RT PCR NEGATIVE NEGATIVE Final    Comment: (NOTE) SARS-CoV-2 target nucleic acids are NOT DETECTED.  The SARS-CoV-2 RNA is generally detectable in upper respiratoy specimens during the acute phase of infection. The lowest concentration of SARS-CoV-2 viral copies this assay can detect is 131 copies/mL. A negative result does not preclude SARS-Cov-2 infection and should not be used as the sole basis for treatment or other patient management decisions. A negative result may occur with  improper specimen collection/handling, submission of specimen other than nasopharyngeal swab, presence of viral mutation(s) within the areas targeted by this assay, and inadequate number of viral copies (<131  copies/mL). A negative result must be combined with clinical observations, patient history, and epidemiological information. The expected result is Negative.  Fact Sheet for Patients:  08/09/20  Fact Sheet for Healthcare Providers:  https://www.moore.com/  This test is no t yet approved or cleared by the https://www.young.biz/ FDA and  has been authorized for detection and/or diagnosis of SARS-CoV-2 by FDA under an Emergency Use Authorization (EUA). This EUA will remain  in effect (meaning this test can be used) for the duration of the COVID-19 declaration under Section 564(b)(1) of the Act, 21 U.S.C. section 360bbb-3(b)(1), unless the authorization is terminated or revoked sooner.     Influenza A by PCR NEGATIVE NEGATIVE Final   Influenza B by PCR NEGATIVE NEGATIVE Final    Comment: (NOTE) The Xpert Xpress SARS-CoV-2/FLU/RSV assay is intended as an aid in  the diagnosis of influenza from Nasopharyngeal swab specimens  and  should not be used as a sole basis for treatment. Nasal washings and  aspirates are unacceptable for Xpert Xpress SARS-CoV-2/FLU/RSV  testing.  Fact Sheet for Patients: https://www.moore.com/  Fact Sheet for Healthcare Providers: https://www.young.biz/  This test is not yet approved or cleared by the [place] FDA and  has been authorized for detection and/or diagnosis of SARS-CoV-2 by  FDA under an Emergency Use Authorization (EUA). This EUA will remain  in effect (meaning this test can be used) for the duration of the  Covid-19 declaration under Section 564(b)(1) of the Act, 21  U.S.C. section 360bbb-3(b)(1), unless the authorization is  terminated or revoked. Performed at [facility], [address] [place], [place] [address]   Respiratory Panel by RT PCR (Flu A&B, Covid) - Nasopharyngeal Swab     Status: None   Collection Time: 08/08/20 11:26 PM    Specimen: Nasopharyngeal Swab  Result Value Ref Range Status   SARS Coronavirus 2 by RT PCR NEGATIVE NEGATIVE Final    Comment: (NOTE) SARS-CoV-2 target nucleic acids are NOT DETECTED.  The SARS-CoV-2 RNA is generally detectable in upper respiratoy specimens during the acute phase of infection. The lowest concentration of SARS-CoV-2 viral copies this assay can detect is 131 copies/mL. A negative result does not preclude SARS-Cov-2 infection and should not be used as the sole basis for treatment or other patient management decisions. A negative result may occur with  improper specimen collection/handling, submission of specimen other than nasopharyngeal swab, presence of viral mutation(s) within the areas targeted by this assay, and inadequate number of viral copies (<131 copies/mL). A negative result must be combined with clinical observations, patient history, and epidemiological information. The expected result is Negative.  Fact Sheet for Patients:  https://www.moore.com/  Fact Sheet for Healthcare Providers:  https://www.young.biz/  This test is no t yet approved or cleared by the [place] FDA and  has been authorized for detection and/or diagnosis of SARS-CoV-2 by FDA under an Emergency Use Authorization (EUA). This EUA will remain  in effect (meaning this test can be used) for the duration of the COVID-19 declaration under Section 564(b)(1) of the Act, 21 U.S.C. section 360bbb-3(b)(1), unless the authorization is terminated or revoked sooner.     Influenza A by PCR NEGATIVE NEGATIVE Final   Influenza B by PCR NEGATIVE NEGATIVE Final    Comment: (NOTE) The Xpert Xpress SARS-CoV-2/FLU/RSV assay is intended as an aid in  the diagnosis of influenza from Nasopharyngeal swab specimens and  should not be used as a sole basis for treatment. Nasal washings and  aspirates are unacceptable for Xpert Xpress SARS-CoV-2/FLU/RSV   testing.  Fact Sheet for Patients: https://www.moore.com/  Fact Sheet for Healthcare Providers: https://www.young.biz/  This test is not yet approved or cleared by the [place] FDA and  has been authorized for detection and/or diagnosis of SARS-CoV-2 by  FDA under an Emergency Use Authorization (EUA). This EUA will remain  in effect (meaning this test can be used) for the duration of the  Covid-19 declaration under Section 564(b)(1) of the Act, 21  U.S.C. section 360bbb-3(b)(1), unless the authorization is  terminated or revoked. Performed at [facility], [address], [place], [place] [address]          Radiology Studies: DG Chest 2 View  Result Date: 08/08/2020 CLINICAL DATA:  Chest pain. EXAM: CHEST - 2 VIEW COMPARISON:  August 07, 2020 FINDINGS: The lungs are hyperinflated. Multiple sternal wires are seen. Stable  diffusely increased interstitial lung markings are seen. Persistent mild to moderate severity right basilar atelectasis and/or infiltrate is noted. There is a stable small to moderate size right pleural effusion. No pneumothorax is identified. The cardiac silhouette is markedly enlarged and unchanged in size. The visualized skeletal structures are unremarkable. IMPRESSION: 1. Stable mild to moderate severity right basilar atelectasis and/or infiltrate. 2. Stable small to moderate size right pleural effusion. Electronically Signed   By: Aram Candelahaddeus  Houston M.D.   On: 08/08/2020 22:14        Scheduled Meds: . apixaban  5 mg Oral BID  . carvedilol  6.25 mg Oral BID WC  . furosemide  40 mg Intravenous Daily  . ipratropium  0.5 mg Nebulization 2 times per day  . losartan  100 mg Oral Daily  . [START ON 08/10/2020] pantoprazole  40 mg Oral Q0600  . potassium chloride  40 mEq Oral Daily  . sodium chloride flush  3 mL Intravenous Q12H   Continuous Infusions:   LOS: 0 days       Kathlen ModyVijaya Myrel Rappleye, MD [facility]   To contact the attending provider between 7A-7P or the covering provider during after hours 7P-7A, please log into the web site [email or web address] and access using universal [place] password for that web site. If you do not have the password, please call the hospital operator.  08/09/2020, 11:56 AM

## 2020-08-09 NOTE — Plan of Care (Signed)

## 2020-08-09 NOTE — Telephone Encounter (Signed)
Rec'd call from patient's daughter, Blanchie Serve and she stated that the patient was admitted to [facility] yesterday.  I have notified our [facility] Liaison Team.

## 2020-08-10 ENCOUNTER — Encounter (HOSPITAL_COMMUNITY): Payer: Self-pay | Admitting: Internal Medicine

## 2020-08-10 DIAGNOSIS — I5033 Acute on chronic diastolic (congestive) heart failure: Secondary | ICD-10-CM | POA: Diagnosis not present

## 2020-08-10 DIAGNOSIS — I482 Chronic atrial fibrillation, unspecified: Secondary | ICD-10-CM | POA: Diagnosis not present

## 2020-08-10 LAB — BASIC METABOLIC PANEL
Anion gap: 8 (ref 5–15)
BUN: 16 mg/dL (ref 8–23)
CO2: 30 mmol/L (ref 22–32)
Calcium: 9 mg/dL (ref 8.9–10.3)
Chloride: 102 mmol/L (ref 98–111)
Creatinine, Ser: 1.21 mg/dL (ref 0.61–1.24)
GFR, Estimated: >60 mL/min (ref >60)
Glucose, Bld: 127 mg/dL — ABNORMAL HIGH (ref 70–99)
Potassium: 4.2 mmol/L (ref 3.5–5.1)
Sodium: 140 mmol/L (ref 135–145)

## 2020-08-10 MED ORDER — HYDRALAZINE HCL 25 MG PO TABS
25.0000 mg | ORAL_TABLET | Freq: Three times a day (TID) | ORAL | Status: DC
  Administered 2020-08-10 – 2020-08-13 (×9): 25 mg via ORAL
  Filled 2020-08-10 (×9): qty 1

## 2020-08-10 MED ORDER — POTASSIUM CHLORIDE CRYS ER 20 MEQ PO TBCR
40.0000 meq | EXTENDED_RELEASE_TABLET | Freq: Once | ORAL | Status: AC
  Administered 2020-08-10: 40 meq via ORAL
  Filled 2020-08-10: qty 2

## 2020-08-10 MED ORDER — FUROSEMIDE 10 MG/ML IJ SOLN
40.0000 mg | Freq: Two times a day (BID) | INTRAMUSCULAR | Status: DC
  Administered 2020-08-10 – 2020-08-12 (×5): 40 mg via INTRAVENOUS
  Filled 2020-08-10 (×5): qty 4

## 2020-08-10 MED ORDER — FLUTICASONE PROPIONATE 50 MCG/ACT NA SUSP
1.0000 | NASAL | Status: AC
  Administered 2020-08-10: 1 via NASAL
  Filled 2020-08-10: qty 16

## 2020-08-10 MED FILL — Perflutren Lipid Microsphere IV Susp 1.1 MG/ML: INTRAVENOUS | Qty: 10 | Status: AC

## 2020-08-10 NOTE — TOC Initial Note (Signed)
Transition of Care [facility]) - Initial/Assessment Note    Patient Details  Name: Carlos Welch MRN: [id number] Date of Birth: Aug 13, 1946  Transition of Care [facility]) CM/SW Contact:    Carlos Sabal, RN Phone Number: 08/10/2020, 4:58 PM  Clinical Narrative:                Carlos Welch w patient's daughter Carlos Welch on the phone.  She states that her dad moved here to live with her in July from [facility]. He spent the previous 6-7 months at [facility] (rehab). He has Medicaid coverage in [facility]. She states that she cancelled the [facility] Medicaid. She has not established his [place] medicaid as of yet. I strongly and repeated encouraged her to do so tomorrow. She states that she is not sure how long or if she will be able to continue caring for him at home as she works "a lot."  She states that he has DME nebulizer, cane, RW at home. He is able to get up in room, to bathroom, by himself. She states that he gets very SOB easily. He has a sleep study for CPAP scheduled Nov 17th. He is followed by [facility] for palliative services.  CM requested PT OT evals from attending.    Expected Discharge Plan: Skilled Nursing Facility Barriers to Discharge: Continued Medical Work up   Patient Goals and CMS Choice        Expected Discharge Plan and Services Expected Discharge Plan: Skilled Nursing Facility                                              Prior Living Arrangements/Services                       Activities of Daily Living Home Assistive Devices/Equipment: Cane (specify quad or straight), Walker (specify type) ADL Screening (condition at time of admission) Patient's cognitive ability adequate to safely complete daily activities?: No Is the patient deaf or have difficulty hearing?: Yes Does the patient have difficulty seeing, even when wearing glasses/contacts?: No Does the patient have difficulty concentrating, remembering, or making decisions?: No Patient able to express need for assistance  with ADLs?: No Does the patient have difficulty dressing or bathing?: Yes Independently performs ADLs?: Yes (appropriate for developmental age) Does the patient have difficulty walking or climbing stairs?: Yes Weakness of Legs: Both Weakness of Arms/Hands: None  Permission Sought/Granted                  Emotional Assessment              Admission diagnosis:  Acute exacerbation of CHF (congestive heart failure) (HCC) [I50.9] Dyspnea, unspecified type [R06.00] Patient Active Problem List   Diagnosis Date Noted   Acute exacerbation of CHF (congestive heart failure) (HCC) 08/08/2020   Abscess of jaw 07/19/2020   Admission for palliative care 07/19/2020   Aortic valve stenosis 07/19/2020   Bacteremia 07/19/2020   Cognitive communication deficit 07/19/2020   Coronary artery disease involving coronary bypass graft of native heart without angina pectoris 07/19/2020   Debility 07/19/2020   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   Nonrheumatic mitral valve regurgitation 07/19/2020   Severe malnutrition (HCC) 07/19/2020   TIA (transient ischemic attack) 07/19/2020   Weight loss 07/19/2020   Acute on chronic heart failure with preserved ejection fraction (HCC) 07/08/2020  BPH (benign prostatic hyperplasia) 07/08/2020   Cellulitis of lower extremity 07/08/2020   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

## 2020-08-10 NOTE — Progress Notes (Signed)
PROGRESS NOTE    Carlos Welch  QRF:[id number] DOB: 1945-10-27 DOA: 08/08/2020 PCP: [name], PA-C    Chief Complaint  Patient presents with  . Chest Pain    Brief Narrative:  74 year old gentleman prior history of diastolic heart failure, aortic valve stenosis s/p valve replacement, atrial fibrillation on Eliquis, coronary artery disease s/p CABG, hypertension, thrombocytopenia, severe mitral valve regurgitation, history of TIA, BPH presents to the ED for worsening shortness of breath, worsening pedal edema, orthopnea, dyspnea on exertion, weight gain, elevated BNP.  He was admitted for acute on chronic diastolic heart failure exacerbation.  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 Fluid and salt restriction. Echocardiogram showed presesrved left ventricular EF with moderate to severe MR, . Cardiology consulted, suggested not a candidate for any surgical repair of the MV/ SURGICAL mitral clip. Recommend medical management.  I/o net - 3.4 lit since admission.  Continue with Coreg and losartan and hdyralazine.     History of atrial fibrillation, rate controlled on Eliquis for anticoagulation and on Coreg for rate control.    History of coronary artery disease s/p CABG Aortic valve stenosis s/p AV replacement. Severe mitral valve regurgitation- nt a candidate for mitral clip and sternotomy is highly risky, hence  medical management.  Patient currently denies any chest pain, continue with Eliquis, Coreg and losartan.      Pancytopenia Unclear etiology   Essential hypertension Blood pressure parameters are not well controlled.  On coreg 6/25 mg BID, losartan 100 mg daily and added hydralazine 25 mg TID.  Marland Kitchen   Hypokalemia Replaced, repeat labs are pending.     AKI Probably secondary to CHF On admission patient's creatinine was 1.6, improved to 1.3 diuresis.    DVT prophylaxis: Eliquis  code Status: Full: Family Communication: None at bedside Disposition:   Status is: Observation  The patient will require care spanning > 2 midnights and should be moved to inpatient because: IV treatments appropriate due to intensity of illness or inability to take PO  Dispo: The patient is from: Home              Anticipated d/c is to: pending.               Anticipated d/c date is: 2 days              Patient currently is not medically stable to d/c.       Consultants:   None.     Procedures: echocardiogram.  Antimicrobials: none.   Subjective: Overnight pt had some sob, but currently on 5l it of [place] oxygen with good sats.   Objective: Vitals:   08/09/20 2326 08/10/20 0349 08/10/20 0700 08/10/20 0810  BP: (!) 162/108 (!) 162/119    Pulse: (!) 57 (!) 57    Resp: 20     Temp: (!) 97.5 F (36.4 C) 98.5 F (36.9 C)    TempSrc: Oral Oral    SpO2: 95% 99%  98%  Weight:   74.4 kg   Height:        Intake/Output Summary (Last 24 hours) at 08/10/2020 1500 Last data filed at 08/10/2020 1401 Gross per 24 hour  Intake --  Output 2585 ml  Net -2585 ml   Filed Weights   08/08/20 2041 08/09/20 0649 08/10/20 0700  Weight: 74.5 kg 75.5 kg 74.4 kg    Examination:  General exam: Alert, not in any kind of distress Respiratory system: Diminished air entry at bases, no wheezing or rhonchi no tachypnea Cardiovascular system: S1-S2 heard, no JVD, leg edema present Gastrointestinal  system: Abdomen is soft, nontender, nondistended, bowel sounds heard Central nervous system: Alert and oriented, grossly nonfocal Extremities: Pedal edema present  skin: No rashes seen Psychiatry: Mood is appropriate  Data Reviewed: I have personally reviewed following labs and imaging studies  CBC: Recent Labs  Lab 08/07/20 0239 08/08/20 2027 08/09/20 0701  WBC 3.7* 4.0 3.3*  NEUTROABS 2.4  --   --   HGB 12.3* 12.4* 12.1*  HCT 40.7 42.4 40.4  MCV 80.3 82.0 79.4*  PLT 142* 127* 129*    Basic Metabolic Panel: Recent Labs  Lab 08/07/20 0239 08/08/20 2027 08/09/20 0701  NA 138 138 140  K 3.6 3.8 3.1*  CL 102 105 104  CO2 GLUCOSE 142* 96 88  BUN 27* 24* 23  CREATININE 1.61* 1.51* 1.33*  CALCIUM 8.9 8.7* 8.6*  MG  --   --  1.8    GFR: Estimated Creatinine Clearance: 52.1 mL/min (A) (by C-G formula based on SCr of 1.33 mg/dL (H)).  Liver Function Tests: Recent Labs  Lab 08/07/20 0239 08/08/20 2120  AST 21 25  ALT 12 12  ALKPHOS 97 96  BILITOT 1.7* 2.0*  PROT 6.9 6.6  ALBUMIN 3.4* 3.1*    CBG: Recent Labs  Lab 08/09/20 2119  GLUCAP 102*     Recent Results (from the past 240 hour(s))  Respiratory Panel by RT PCR (Flu A&B, Covid) - Nasopharyngeal Swab     Status: None   Collection Time: 08/07/20  2:47 AM   Specimen: Nasopharyngeal Swab  Result Value Ref Range Status   SARS Coronavirus 2 by RT PCR NEGATIVE NEGATIVE Final    Comment: (NOTE) SARS-CoV-2 target nucleic acids are NOT DETECTED.  The SARS-CoV-2 RNA is generally detectable in upper respiratoy specimens during the acute phase of infection. The lowest concentration of SARS-CoV-2 viral copies this assay can detect is 131 copies/mL. A negative result does not preclude SARS-Cov-2 infection and should not be used as the sole basis for treatment or other patient management decisions. A negative result may occur with  improper specimen collection/handling, submission of specimen  other than nasopharyngeal swab, presence of viral mutation(s) within the areas targeted by this assay, and inadequate number of viral copies (<131 copies/mL). A negative result must be combined with clinical observations, patient history, and epidemiological information. The expected result is Negative.  Fact Sheet for Patients:  https://www.moore.com/  Fact Sheet for Healthcare Providers:  https://www.young.biz/  This test is no t yet approved or cleared by the [place] FDA and  has been authorized for detection and/or diagnosis of SARS-CoV-2 by FDA under an Emergency Use Authorization (EUA). This EUA will remain  in effect (meaning this test can be used) for the duration of the COVID-19 declaration under Section 564(b)(1) of the Act, 21 U.S.C. section 360bbb-3(b)(1), unless the authorization is terminated or revoked sooner.     Influenza  A by PCR NEGATIVE NEGATIVE Final   Influenza B by PCR NEGATIVE NEGATIVE Final    Comment: (NOTE) The Xpert Xpress SARS-CoV-2/FLU/RSV assay is intended as an aid in  the diagnosis of influenza from Nasopharyngeal swab specimens and  should not be used as a sole basis for treatment. Nasal washings and  aspirates are unacceptable for Xpert Xpress SARS-CoV-2/FLU/RSV  testing.  Fact Sheet for Patients: https://www.moore.com/  Fact Sheet for Healthcare Providers: https://www.young.biz/  This test is not yet approved or cleared by the [place] FDA and  has been authorized for detection and/or diagnosis of SARS-CoV-2 by  FDA under an Emergency Use Authorization (EUA). This EUA will remain  in effect (meaning this test can be used) for the duration of the  Covid-19 declaration under Section 564(b)(1) of the Act, 21  U.S.C. section 360bbb-3(b)(1), unless the authorization is  terminated or revoked. Performed at [facility], [address]  [place], [place] [address]   Respiratory Panel by RT PCR (Flu A&B, Covid) - Nasopharyngeal Swab     Status: None   Collection Time: 08/08/20 11:26 PM   Specimen: Nasopharyngeal Swab  Result Value Ref Range Status   SARS Coronavirus 2 by RT PCR NEGATIVE NEGATIVE Final    Comment: (NOTE) SARS-CoV-2 target nucleic acids are NOT DETECTED.  The SARS-CoV-2 RNA is generally detectable in upper respiratoy specimens during the acute phase of infection. The lowest concentration of SARS-CoV-2 viral copies this assay can detect is 131 copies/mL. A negative result does not preclude SARS-Cov-2 infection and should not be used as the sole basis for treatment or other patient management decisions. A negative result may occur with  improper specimen collection/handling, submission of specimen other than nasopharyngeal swab, presence of viral mutation(s) within the areas targeted by this assay, and inadequate number of viral copies (<131 copies/mL). A negative result must be combined with clinical observations, patient history, and epidemiological information. The expected result is Negative.  Fact Sheet for Patients:  https://www.moore.com/  Fact Sheet for Healthcare Providers:  https://www.young.biz/  This test is no t yet approved or cleared by the [place] FDA and  has been authorized for detection and/or diagnosis of SARS-CoV-2 by FDA under an Emergency Use Authorization (EUA). This EUA will remain  in effect (meaning this test can be used) for the duration of the COVID-19 declaration under Section 564(b)(1) of the Act, 21 U.S.C. section 360bbb-3(b)(1), unless the authorization is terminated or revoked sooner.     Influenza A by PCR NEGATIVE NEGATIVE Final   Influenza B by PCR NEGATIVE NEGATIVE Final    Comment: (NOTE) The Xpert Xpress SARS-CoV-2/FLU/RSV assay is intended as an aid in  the diagnosis of influenza from Nasopharyngeal swab specimens and   should not be used as a sole basis for treatment. Nasal washings and  aspirates are unacceptable for Xpert Xpress SARS-CoV-2/FLU/RSV  testing.  Fact Sheet for Patients: https://www.moore.com/  Fact Sheet for Healthcare Providers: https://www.young.biz/  This test is not yet approved or cleared by the [place] FDA and  has been authorized for detection and/or diagnosis of SARS-CoV-2 by  FDA under an Emergency Use Authorization (EUA). This EUA will remain  in effect (meaning this test can be used) for the duration of the  Covid-19 declaration under Section 564(b)(1) of the Act, 21  U.S.C. section 360bbb-3(b)(1), unless the authorization is  terminated or revoked. Performed at [facility], [address], [place], [place] [address]  Radiology Studies: DG Chest 2 View  Result Date: 08/08/2020 CLINICAL DATA:  Chest pain. EXAM: CHEST - 2 VIEW COMPARISON:  August 07, 2020 FINDINGS: The lungs are hyperinflated. Multiple sternal wires are seen. Stable diffusely increased interstitial lung markings are seen. Persistent mild to moderate severity right basilar atelectasis and/or infiltrate is noted. There is a stable small to moderate size right pleural effusion. No pneumothorax is identified. The cardiac silhouette is markedly enlarged and unchanged in size. The visualized skeletal structures are unremarkable. IMPRESSION: 1. Stable mild to moderate severity right basilar atelectasis and/or infiltrate. 2. Stable small to moderate size right pleural effusion. Electronically Signed   By: Aram Candelahaddeus  Houston M.D.   On: 08/08/2020 22:14   ECHOCARDIOGRAM COMPLETE  Result Date: 08/09/2020    ECHOCARDIOGRAM REPORT   [facility] Date of Exam: 08/09/2020 Medical Rec #:  [id number]  Height:       74.0 in Accession #:    [id number] Weight:       166.4 lb Date of Birth:  1946/01/07 BSA:          2.010 m Patient Age:    73 years    BP:           171/115 mmHg Patient Gender: M          HR:           62 bpm. Exam Location:  Inpatient Procedure: 2D Echo, Cardiac Doppler, Color Doppler and Intracardiac            Opacification Agent Indications:    Dyspnea  History:        Patient has prior history of Echocardiogram examinations, most                 recent 05/13/2020. CAD, Prior CABG, Mitral Valve Disease and                 Aortic Valve Disease, Arrythmias:Atrial Fibrillation,                 Signs/Symptoms:Shortness of Breath; Risk Factors:Hypertension                 and Former Smoker. S/p AVR. TIA.                 Aortic Valve: valve is present in the aortic position.  Sonographer:    Ross LudwigArthur Guy RDCS (AE) Referring Phys: [id number] Cecille PoLEXANDER B MELVIN IMPRESSIONS  1. Left ventricular ejection fraction, by estimation, is 50 to 55%. The left ventricle has low normal function. The left ventricle has no regional wall motion abnormalities. There is moderate left ventricular hypertrophy. Left ventricular diastolic parameters are indeterminate.  2. Right ventricular systolic function is moderately reduced. The right ventricular size is mildly enlarged. There is severely elevated pulmonary artery systolic pressure. The estimated right ventricular systolic pressure is 71.2 mmHg.  3. Left atrial size was severely dilated.  4. Right atrial size was severely dilated.  5. The mitral valve is grossly normal. Moderate to severe mitral valve regurgitation. The mean mitral valve gradient is 5.0 mmHg with average heart rate of 61 bpm.  6. Tricuspid valve regurgitation is severe.  7. Bioprosethetic valve with mild thickening. Acceleration time 85 msec. DVI 0.33; no perivalvular AI. The aortic valve is calcified. Aortic valve regurgitation is not visualized. There is a valve present in the aortic position.  8. Aortic dilatation noted. There is borderline dilatation of the aortic root, measuring 38 mm.  9. The inferior  vena cava is dilated in size with <50%  respiratory variability, suggesting right atrial pressure of 15 mmHg. Comparison(s): A prior study was performed on 05/12/20. LV is less vigorous in this study. Stable Aortic Root. Significant tricuspid and mitral regurgitation with RV dysfuction; worse from prior. FINDINGS  Left Ventricle: Left ventricular ejection fraction, by estimation, is 50 to 55%. The left ventricle has low normal function. The left ventricle has no regional wall motion abnormalities. Definity contrast agent was given IV to delineate the left ventricular endocardial borders. The left ventricular internal cavity size was normal in size. There is moderate left ventricular hypertrophy. Left ventricular diastolic parameters are indeterminate. Right Ventricle: The right ventricular size is mildly enlarged. No increase in right ventricular wall thickness. Right ventricular systolic function is moderately reduced. There is severely elevated pulmonary artery systolic pressure. The tricuspid regurgitant velocity is 3.75 m/s, and with an assumed right atrial pressure of 15 mmHg, the estimated right ventricular systolic pressure is 71.2 mmHg. Left Atrium: Left atrial size was severely dilated. Right Atrium: Right atrial size was severely dilated. Pericardium: There is no evidence of pericardial effusion. Mitral Valve: The mitral valve is grossly normal. Moderate to severe mitral valve regurgitation. The mean mitral valve gradient is 5.0 mmHg with average heart rate of 61 bpm. Tricuspid Valve: The tricuspid valve is grossly normal. Tricuspid valve regurgitation is severe. Aortic Valve: Bioprosethetic valve with mild thickening. Acceleration time 85 msec. DVI 0.33; no perivalvular AI. The aortic valve is calcified. Aortic valve regurgitation is not visualized. Aortic valve mean gradient measures 6.0 mmHg. Aortic valve peak  gradient measures 11.9 mmHg. Aortic valve area, by VTI measures 1.79 cm. There is a valve present in the aortic position. Pulmonic  Valve: The pulmonic valve was not well visualized. Pulmonic valve regurgitation is trivial. Aorta: Aortic dilatation noted. There is borderline dilatation of the aortic root, measuring 38 mm. Venous: The inferior vena cava is dilated in size with less than 50% respiratory variability, suggesting right atrial pressure of 15 mmHg. IAS/Shunts: The atrial septum is grossly normal.  LEFT VENTRICLE PLAX 2D LVIDd:         5.00 cm LVIDs:         4.10 cm LV PW:         1.30 cm LV IVS:        2.30 cm LVOT diam:     2.40 cm LV SV:         53 LV SV Index:   26 LVOT Area:     4.52 cm  RIGHT VENTRICLE            IVC RV S prime:     3.94 cm/s  IVC diam: 3.00 cm LEFT ATRIUM              Index       RIGHT ATRIUM           Index LA diam:        5.50 cm  2.74 cm/m  RA Area:     31.10 cm LA Vol (A2C):   147.0 ml 73.12 ml/m RA Volume:   115.00 ml 57.21 ml/m LA Vol (A4C):   128.0 ml 63.67 ml/m LA Biplane Vol: 137.0 ml 68.15 ml/m  AORTIC VALVE AV Area (Vmax):    1.75 cm AV Area (Vmean):   1.70 cm AV Area (VTI):     1.79 cm AV Vmax:           172.80 cm/s AV Vmean:  112.800 cm/s AV VTI:            0.297 m AV Peak Grad:      11.9 mmHg AV Mean Grad:      6.0 mmHg LVOT Vmax:         66.76 cm/s LVOT Vmean:        42.340 cm/s LVOT VTI:          0.117 m LVOT/AV VTI ratio: 0.40  AORTA Ao Root diam: 3.80 cm Ao Asc diam:  3.80 cm MITRAL VALVE           TRICUSPID VALVE MV Mean grad: 5.0 mmHg TR Peak grad:   56.2 mmHg                        TR Vmax:        375.00 cm/s                         SHUNTS                        Systemic VTI:  0.12 m                        Systemic Diam: 2.40 cm Riley Lam MD Electronically signed by Riley Lam MD Signature Date/Time: 08/09/2020/2:41:49 PM    Final         Scheduled Meds: . apixaban  5 mg Oral BID  . carvedilol  6.25 mg Oral BID WC  . furosemide  40 mg Intravenous BID  . hydrALAZINE  25 mg Oral Q8H  . ipratropium  0.5 mg Nebulization 2 times per day  .  losartan  100 mg Oral Daily  . pantoprazole  40 mg Oral Q0600  . potassium chloride  40 mEq Oral Daily  . sodium chloride flush  3 mL Intravenous Q12H   Continuous Infusions:   LOS: 1 day       Kathlen Mody, MD [facility]   To contact the attending provider between 7A-7P or the covering provider during after hours 7P-7A, please log into the web site [email or web address] and access using universal [place] password for that web site. If you do not have the password, please call the hospital operator.  08/10/2020, 3:00 PM

## 2020-08-10 NOTE — Progress Notes (Signed)
[facility] ([facility]) Community Based Palliative Care  This patient is enrolled in our palliative care services in the community.   [facility] will continue to follow for any discharge planning needs and to coordinate continuation of palliative care.  If you have questions or need assistance, please call [phone] or contact the hospital Liaison listed on AMION.  Thank you for the opportunity to participate in this patient's care.  Sherry Gibson, RN [facility] Hospital Liaison [phone] 

## 2020-08-10 NOTE — Telephone Encounter (Signed)
Checked CMM for status of the PA for ipratropium bromide 0.02% sol and it shows that the PA was denied.  Reason for denial was that plan preferred product was Atrovent HFA.   Called and spoke with pt's daughter Carlos Welch letting her know the status of the PA for the neb solution and she verbalized understanding. Carlos Welch stated that pt had to go to the ED and he was given the atrovent inhaler then.  Pt is currently admitted into the hospital as of 10/24.  Carlos Welch stated that the cost of the neb sol was $64 so she was going to pay for it OOP. Stated to her that we could possibly redo the PA later since pt is trying the inhaler and she verbalized understanding. Carlos Welch stated she would let us know if she would want to try to have the PA resubmitted. Nothing further needed.

## 2020-08-10 NOTE — Progress Notes (Signed)
Respiratory called and at bedside.  

## 2020-08-10 NOTE — Progress Notes (Addendum)
Pt was given nasal spray for nasal congestion. Patient became upset that he could not hold on to the nasal spray and use it continuously. Patient then began requesting vicks vapor rub.. this RN made the patient aware that this medication is not available at this hospital. Pt proceeded to call this RN a liar and stated "what kind of hospital doesn't have vapor rub". Informed that this will need to be brought from home. Patient has no obvious signs of distress. Will continue to monitor.

## 2020-08-10 NOTE — Progress Notes (Signed)
Pt began to yell "I cannot breathe" repeatedly. Threw his urinal at the door and proceeded to push his bedside table over. Pt was given a NRB to help his respiratory distress. SPO2 was 100% with 5L via HFNC before the NRB and 100% after. No obvious signs of distress.  Charge RN aware.

## 2020-08-10 NOTE — Consult Note (Addendum)
Cardiology Consultation:   Patient ID: Kyley Laurel MRN: [id number]; DOB: 1945/11/17  Admit date: 08/08/2020 Date of Consult: 08/10/2020  Primary Care Provider: [name], PA-C [facility] Cardiologist: Thurmon Fair, MD  [facility] Electrophysiologist:  None    Patient Profile:   Antwone Capozzoli is a 75 y.o. male with a hx of chronic a fib (eliquis), CAD with CABG, AVR for AS,and LAA clipping 2015, and MVR,  TIA, recent sepsis (04/2020- PNA) and mod to severe MR and severe TR no vegetation on TEE, thrombocytopena  who is being seen today for the evaluation of CHF at the request of Dr. Blake Divine.  History of Present Illness:   Mr. Hinton with above hx including subacute bacterial endocarditis and MRSA bacteremia. Hx of non compliance.  Hx of freq admits for CHF and leaves AMA.  Last TEE piror to admit 05/13/20 with normal EF, moderate LVH, severe biatrial enlargement s/p AVR with no AS or AI , mod to severe MR and moderate to severe TR small oscillating densities in LV cavity of  uncertain etiology; possible rupture  chordae; would be unusual location for vegetations.  G3 DD.    Pt not a candidate for mitra clip. And per Dr. Royann Shivers and Dr. Mayford Knife given his prior aortic valve replacement, sternotomy, he would be too high risk for surgical repair.   Medical management.   Has been to ER several times since discharge in August, mostly with SOB and diuresed also with shingles.   08/03/20 CT chest with Significantly enlarged loculated right pleural effusion is noted compared to prior exam.  Stable small loculated left pleural effusion is noted medially in the posterior portion of the left lung base. 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  Also chronic abd discomfort.  troponins were flat at 13 and 12, BNP was >1200. rec'd IV lasix.    EKG:  The EKG was personally reviewed and demonstrates:  A fib at 60 with new  lateral ischemia Telemetry:  Telemetry was personally reviewed and demonstrates:  A fib rate controlled with NSVT 4 beats  Echo this admit with EF 50-55%, no RWMA, moderate LVH, RV systolic function mod. Reduced, RV size is mildly enlarged, severely elevated pulmonary artery systolic pressure.  RV systolic pressure of 71.2 Both Rt and Lt atrium severely dilated , mod to severe MR, severe TR, bioprosthetic AV. No AI  Aortic root 38 mm    Labs today Na 140, K+ 3.1, Cr 1.33, Mg+ 1.8  Hgb 12.1, WBC 3.3 plts 129    BP elevated 162/119  He is neg 1730 since admit. And wt from 75.5 to 74.4 kg. He tells me he feels better.  He stated that at home he is goes to bed and cannot breathe.  He does not have 02 so he comes to ER.        Past Medical History:  Diagnosis Date  . A-fib (HCC)   . Abscess of jaw 07/19/2020  . Acute on chronic heart failure with preserved ejection fraction (HCC) 07/08/2020  . Aortic stenosis    a. s/p pericardial AVR 2015.  Marland Kitchen Aortic valve stenosis 07/19/2020  . Bacteremia 07/19/2020  . Bacterial endocarditis   . BPH (benign prostatic hyperplasia) 07/08/2020  . CAD (coronary artery disease)    a. s/p CABG, AVR, LAA clipping 2015 at [facility].  . Cellulitis of lower extremity 07/08/2020  . Chronic atrial fibrillation (HCC) 07/08/2020  .  Chronic combined systolic and diastolic CHF (congestive heart failure) (HCC)   . Cognitive communication deficit 07/19/2020  . Coronary artery disease involving coronary bypass graft of native heart without angina pectoris 07/19/2020  . Debility 07/19/2020  . 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 Elevated troponin 07/08/2020  . Essential hypertension 07/08/2020  . Generalized abdominal pain   . History of noncompliance with medical treatment   . Hyperlipemia 07/08/2020  . 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  . Malnutrition (HCC)   . MRSA bacteremia   . Nonrheumatic mitral valve regurgitation  07/19/2020  . Normocytic anemia 07/08/2020  . Pressure injury of skin 05/12/2020  . 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 Severe malnutrition (HCC) 07/19/2020  . Thrombocytopenia (HCC) 07/08/2020  . TIA (transient ischemic attack)   . Weight loss 07/19/2020    Past Surgical History:  Procedure Laterality Date  . AORTIC VALVE REPLACEMENT    . CORONARY ARTERY BYPASS GRAFT  2015  . HERNIA REPAIR    . REMOVAL OF IMPLANT  03/14/2020  . TEE WITHOUT CARDIOVERSION N/A 05/13/2020   Procedure: TRANSESOPHAGEAL ECHOCARDIOGRAM (TEE);  Surgeon: Lewayne Buntingrenshaw, Brian S, MD;  [facility];  Service: Cardiovascular;  Laterality: N/A;     Home Medications:  Prior to Admission medications   Medication Sig Start Date End Date Taking? Authorizing Provider  apixaban (ELIQUIS) 5 MG TABS tablet Take 1 tablet (5 mg total) by mouth in the morning and at bedtime. 05/19/20  Yes Lewie ChamberGirguis, David, MD  aspirin 81 MG chewable tablet Chew 81 mg by mouth daily as needed for moderate pain.    Yes [provider]  carvedilol (COREG) 6.25 MG tablet Take 1 tablet (6.25 mg total) by mouth 2 (two) times daily with a meal. 05/19/20  Yes Lewie ChamberGirguis, David, MD  furosemide (LASIX) 40 MG tablet Take 0.5 tablets (20 mg total) by mouth daily as needed. Patient taking differently: Take 20 mg by mouth daily as needed for fluid.  08/04/20  Yes Cleaver, Thomasene RippleJesse M, NP  Ginkgo Biloba 40 MG TABS Take 1 tablet by mouth daily.   Yes [provider]  HEMP OIL-VANILLYL BUTYL ETHER EX Apply 1 application topically daily.   Yes [provider]  HYDROcodone-acetaminophen (NORCO) 5-325 MG tablet Take 0.5 tablets by mouth every 4 (four) hours as needed (for pain). 08/07/20  Yes Molpus, John, MD  ipratropium (ATROVENT) 0.02 % nebulizer solution Take 2.5 mLs (0.5 mg total) by nebulization in the morning, at noon, and at bedtime. 07/28/20  Yes Hunsucker, Lesia SagoMatthew R, MD  losartan (COZAAR) 100 MG tablet Take 1  tablet (100 mg total) by mouth daily. 06/09/20  Yes Saguier, Ramon DredgeEdward, PA-C  Multiple Vitamin (MULTIVITAMIN WITH MINERALS) TABS tablet Take 1 tablet by mouth daily.   Yes [provider]  ondansetron (ZOFRAN ODT) 4 MG disintegrating tablet Take 1 tablet (4 mg total) by mouth every 8 (eight) hours as needed for nausea or vomiting. 07/07/20  Yes Saguier, Ramon DredgeEdward, PA-C  OXYGEN Inhale 2-3 sprays into the lungs every 4 (four) hours as needed (for shortness of breath).   Yes [provider]  potassium chloride SA (KLOR-CON) 20 MEQ tablet Take 20 mEq by mouth daily with breakfast.   Yes [provider]  TURMERIC PO Take 1 tablet by mouth daily.   Yes [provider]    Inpatient Medications: Scheduled Meds: . apixaban  5 mg Oral BID  . carvedilol  6.25 mg  Oral BID WC  . furosemide  40 mg Intravenous Daily  . ipratropium  0.5 mg Nebulization 2 times per day  . losartan  100 mg Oral Daily  . pantoprazole  40 mg Oral Q0600  . potassium chloride  40 mEq Oral Daily  . sodium chloride flush  3 mL Intravenous Q12H   Continuous Infusions:  PRN Meds: acetaminophen **OR** acetaminophen, alum & mag hydroxide-simeth, HYDROcodone-acetaminophen  Allergies:    Allergies  Allergen Reactions  . Chocolate Rash  . Chocolate Flavor Rash  . Peanut Oil Rash    Social History:   Social History   Socioeconomic History  . Marital status: Divorced    Spouse name: Not on file  . Number of children: Not on file  . Years of education: Not on file  . Highest education level: Not on file  Occupational History  . Not on file  Tobacco Use  . Smoking status: Former Smoker    Packs/day: 3.00    Years: 47.00    Pack years: 141.00    Types: Cigarettes, Cigars    Start date: 1964    Quit date: 07/28/2010    Years since quitting: 10.0  . Smokeless tobacco: Never Used  Substance and Sexual Activity  . Alcohol use: No  . Drug use: No  . Sexual activity: Not on file  Other  Topics Concern  . Not on file  Social History Narrative  . Not on file   Social Determinants of Health   Financial Resource Strain:   . Difficulty of Paying Living Expenses: Not on file  Food Insecurity:   . Worried About Programme researcher, broadcasting/film/video in the Last Year: Not on file  . Ran Out of Food in the Last Year: Not on file  Transportation Needs:   . Lack of Transportation (Medical): Not on file  . Lack of Transportation (Non-Medical): Not on file  Physical Activity:   . Days of Exercise per Week: Not on file  . Minutes of Exercise per Session: Not on file  Stress:   . Feeling of Stress : Not on file  Social Connections:   . Frequency of Communication with Friends and Family: Not on file  . Frequency of Social Gatherings with Friends and Family: Not on file  . Attends Religious Services: Not on file  . Active Member of Clubs or Organizations: Not on file  . Attends Banker Meetings: Not on file  . Marital Status: Not on file  Intimate Partner Violence:   . Fear of Current or Ex-Partner: Not on file  . Emotionally Abused: Not on file  . Physically Abused: Not on file  . Sexually Abused: Not on file    Family History:    Family History  Problem Relation Age of Onset  . CAD Neg Hx      ROS:  Please see the history of present illness.  General:no colds or fevers, no weight changes Skin:no rashes or ulcers HEENT:no blurred vision, no congestion CV:see HPI PUL:see HPI GI:no diarrhea constipation or melena, no indigestion GU:no hematuria, no dysuria MS:no joint pain, no claudication Neuro:no syncope, no lightheadedness, hx TIA  Endo:no diabetes, no thyroid disease  All other ROS reviewed and negative.     Physical Exam/Data:   Vitals:   08/09/20 2326 08/10/20 0349 08/10/20 0700 08/10/20 0810  BP: (!) 162/108 (!) 162/119    Pulse: (!) 57 (!) 57    Resp: 20     Temp: (!) 97.5 F (  36.4 C) 98.5 F (36.9 C)    TempSrc: Oral Oral    SpO2: 95% 99%  98%    Weight:   74.4 kg   Height:        Intake/Output Summary (Last 24 hours) at 08/10/2020 1155 Last data filed at 08/10/2020 1142 Gross per 24 hour  Intake --  Output 2360 ml  Net -2360 ml   Last 3 Weights 08/10/2020 08/09/2020 08/08/2020  Weight (lbs) 164 lb 0.4 oz 166 lb 7.2 oz 164 lb 3.9 oz  Weight (kg) 74.4 kg 75.5 kg 74.5 kg     Body mass index is 21.06 kg/m.  General:  Well nourished, well developed, in no acute distress sitting up in bed HEENT: normal Lymph: no adenopathy Neck: no JVD Endocrine:  No thryomegaly Vascular: No carotid bruits; pedal pulses 1+ bilaterally without bruits  Cardiac:  normal S1, S2; RRR; 3-4/6 murmur no gallup or rub Lungs:  Diminished breath sounds Rt > Lt to  auscultation bilaterally, no wheezing, rhonchi + rales  Abd: soft, nontender, no hepatomegaly  Ext: + 1 edema lower ext but improved.  Musculoskeletal:  No deformities, BUE and BLE strength normal and equal Skin: warm and dry  Neuro:  Alert and oriented X 3 MAE follows commands, no focal abnormalities noted Psych:  Normal affect    Relevant CV Studies: TEE 05/13/20 IMPRESSIONS    1. Normal LV function; moderate LVH; severe biatrial enlargement; mild  RVE; s/p AVR with no AS or AI; thickened MV with moderate to severe MR;  moderate to severe TR; small oscillating densities in LV cavity of  uncertain etiology; possible rupture  chordae; would be unusual location for vegetations.  2. Left ventricular ejection fraction, by estimation, is 55 to 60%. The  left ventricle has normal function. The left ventricle has no regional  wall motion abnormalities. There is moderate left ventricular hypertrophy.  3. Right ventricular systolic function is normal. The right ventricular  size is mildly enlarged.  4. Left atrial size was severely dilated. No left atrial/left atrial  appendage thrombus was detected.  5. Right atrial size was severely dilated.  6. The mitral valve is abnormal.  Moderate to severe mitral valve  regurgitation.  7. Tricuspid valve regurgitation is moderate to severe.  8. The aortic valve has been repaired/replaced. Aortic valve  regurgitation is not visualized. No aortic stenosis is present.  9. 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   FINDINGS  Left Ventricle: Left ventricular ejection fraction, by estimation, is 55  to 60%. The left ventricle has normal function. The left ventricle has no  regional wall motion abnormalities. The left ventricular internal cavity  size was normal in size. There is  moderate left ventricular hypertrophy.   Right Ventricle: The right ventricular size is mildly enlarged.Right  ventricular systolic function is normal.   Left Atrium: LAA clipped. Left atrial size was severely dilated.  Spontaneous echo contrast was present. No left atrial/left atrial  appendage thrombus was detected.   Right Atrium: Right atrial size was severely dilated.   Pericardium: There is no evidence of pericardial effusion.   Mitral Valve: The mitral valve is abnormal. There is severe thickening of  the mitral valve leaflet(s). Moderate to severe mitral valve  regurgitation. MV peak gradient, 6.4 mmHg. The mean mitral valve gradient  is 2.5 mmHg.   Tricuspid Valve: The tricuspid valve is grossly normal. Tricuspid valve  regurgitation is moderate to severe.   Aortic Valve: The aortic  valve has been repaired/replaced. Aortic valve  regurgitation is not visualized. No aortic stenosis is present. Aortic  valve mean gradient measures 8.0 mmHg. Aortic valve peak gradient measures  13.4 mmHg. There is a bioprosthetic  valve present in the aortic position.   Pulmonic Valve: The pulmonic valve was normal in structure. Pulmonic valve  regurgitation is trivial.   Aorta: The aortic root is normal in size and structure. There is moderate  (Grade III) plaque involving the descending aorta.   IAS/Shunts: No  atrial level shunt detected by color flow Doppler.   TTE 08/09/20 IMPRESSIONS    1. Left ventricular ejection fraction, by estimation, is 50 to 55%. The  left ventricle has low normal function. The left ventricle has no regional  wall motion abnormalities. There is moderate left ventricular hypertrophy.  Left ventricular diastolic  parameters are indeterminate.  2. Right ventricular systolic function is moderately reduced. The right  ventricular size is mildly enlarged. There is severely elevated pulmonary  artery systolic pressure. The estimated right ventricular systolic  pressure is 71.2 mmHg.  3. Left atrial size was severely dilated.  4. Right atrial size was severely dilated.  5. The mitral valve is grossly normal. Moderate to severe mitral valve  regurgitation. The mean mitral valve gradient is 5.0 mmHg with average  heart rate of 61 bpm.  6. Tricuspid valve regurgitation is severe.  7. Bioprosethetic valve with mild thickening. Acceleration time 85 msec.  DVI 0.33; no perivalvular AI. The aortic valve is calcified. Aortic valve  regurgitation is not visualized. There is a valve present in the aortic  position.  8. Aortic dilatation noted. There is borderline dilatation of the aortic  root, measuring 38 mm.  9. The inferior vena cava is dilated in size with <50% respiratory  variability, suggesting right atrial pressure of 15 mmHg.   Comparison(s): A prior study was performed on 05/12/20. LV is less vigorous  in this study. Stable Aortic Root. Significant tricuspid and mitral  regurgitation with RV dysfuction; worse from prior.   FINDINGS  Left Ventricle: Left ventricular ejection fraction, by estimation, is 50  to 55%. The left ventricle has low normal function. The left ventricle has  no regional wall motion abnormalities. Definity contrast agent was given  IV to delineate the left  ventricular endocardial borders. The left ventricular internal cavity size    was normal in size. There is moderate left ventricular hypertrophy. Left  ventricular diastolic parameters are indeterminate.   Right Ventricle: The right ventricular size is mildly enlarged. No  increase in right ventricular wall thickness. Right ventricular systolic  function is moderately reduced. There is severely elevated pulmonary  artery systolic pressure. The tricuspid  regurgitant velocity is 3.75 m/s, and with an assumed right atrial  pressure of 15 mmHg, the estimated right ventricular systolic pressure is  71.2 mmHg.   Left Atrium: Left atrial size was severely dilated.   Right Atrium: Right atrial size was severely dilated.   Pericardium: There is no evidence of pericardial effusion.   Mitral Valve: The mitral valve is grossly normal. Moderate to severe  mitral valve regurgitation. The mean mitral valve gradient is 5.0 mmHg  with average heart rate of 61 bpm.   Tricuspid Valve: The tricuspid valve is grossly normal. Tricuspid valve  regurgitation is severe.   Aortic Valve: Bioprosethetic valve with mild thickening. Acceleration time  85 msec. DVI 0.33; no perivalvular AI. The aortic valve is calcified.  Aortic valve regurgitation is not visualized. Aortic valve  mean gradient  measures 6.0 mmHg. Aortic valve peak  gradient measures 11.9 mmHg. Aortic valve area, by VTI measures 1.79 cm.  There is a valve present in the aortic position.   Pulmonic Valve: The pulmonic valve was not well visualized. Pulmonic valve  regurgitation is trivial.   Aorta: Aortic dilatation noted. There is borderline dilatation of the  aortic root, measuring 38 mm.   Venous: The inferior vena cava is dilated in size with less than 50%  respiratory variability, suggesting right atrial pressure of 15 mmHg.   IAS/Shunts: The atrial septum is grossly normal.    Rt and Lt cardiac cath 09/10/19   RHC 11/25: RA 2, RV 30/2, PCWP 10, PA 30/14/20,  PA Sat - 55.3% Arterial Sat- 95.7%, PCW Sat  96.3 CO/CI - 2.43/1.31 (TD), 3.87/2.09 (Fick)  LHC 11/25: JG:OTLX diffuse disease   BWI:OMBTDHRC moderate stenosis with circumferential calcification (as seen on IVUS), mid and distal mild-moderate diffuse  BUL:AGTXMIWO mild diffuse disease, distal focal 90% stenosis OM: proximal focal 75% stenosis  EHO:ZYYQ diffuse disease   LVEDP:  Grafts: SVG-? : ostial occlusion LIMA nonselectively injected showing no anastomosis  IVUS of LM-mid LAD performed for further characterization with above results  Simultaneous LV and Aortic tracings were obtained with a 59F Destination sheath terminating in the ascending aorta and a pigtail catheter in the LV.  Mean Transvalvular Gradient: Aortic Valve Area: 0.85cm2  Conclusions:  Severe CAD as above Occluded SVG  Bioprosthetic aortic valve dysfunction c/f pseudo stenosis vs severe low flow low gradient stenosis Low filling pressures and cardiac output    Laboratory Data:  High Sensitivity Troponin:   Recent Labs  Lab 07/20/20 0518 07/20/20 0842 08/01/20 0455 08/08/20 2027 08/08/20 2120  TROPONINIHS 14 13 16 13 12      Chemistry Recent Labs  Lab 08/07/20 0239 08/08/20 2027 08/09/20 0701  NA 138 138 140  K 3.6 3.8 3.1*  CL 102 105 104  CO2 26 22 26   GLUCOSE 142* 96 88  BUN 27* 24* 23  CREATININE 1.61* 1.51* 1.33*  CALCIUM 8.9 8.7* 8.6*  GFRNONAA 45* 49* 57*  ANIONGAP 10 11 10     Recent Labs  Lab 08/07/20 0239 08/08/20 2120  PROT 6.9 6.6  ALBUMIN 3.4* 3.1*  AST 21 25  ALT 12 12  ALKPHOS 97 96  BILITOT 1.7* 2.0*   Hematology Recent Labs  Lab 08/07/20 0239 08/08/20 2027 08/09/20 0701  WBC 3.7* 4.0 3.3*  RBC 5.07 5.17 5.09  HGB 12.3* 12.4* 12.1*  HCT 40.7 42.4 40.4  MCV 80.3 82.0 79.4*  MCH 24.3* 24.0* 23.8*  MCHC 30.2 29.2* 30.0  RDW 20.9* 20.8* 20.5*  PLT 142* 127* 129*   BNP Recent Labs  Lab 08/08/20 2120  BNP 1,288.7*    DDimer No results for input(s): DDIMER in the last  168 hours.   Radiology/Studies:  DG Chest 2 View  Result Date: 08/08/2020 CLINICAL DATA:  Chest pain. EXAM: CHEST - 2 VIEW COMPARISON:  August 07, 2020 FINDINGS: The lungs are hyperinflated. Multiple sternal wires are seen. Stable diffusely increased interstitial lung markings are seen. Persistent mild to moderate severity right basilar atelectasis and/or infiltrate is noted. There is a stable small to moderate size right pleural effusion. No pneumothorax is identified. The cardiac silhouette is markedly enlarged and unchanged in size. The visualized skeletal structures are unremarkable. IMPRESSION: 1. Stable mild to moderate severity right basilar atelectasis and/or infiltrate. 2. Stable small to moderate size right pleural effusion. Electronically  Signed   By: Aram Candela M.D.   On: 08/08/2020 22:14   DG Chest 2 View  Result Date: 08/07/2020 CLINICAL DATA:  Shortness of breath. Bilateral lower extremity swelling. Weakness. Previous smoker. EXAM: CHEST - 2 VIEW COMPARISON:  08/01/2020 FINDINGS: Postoperative changes in the mediastinum. Cardiac enlargement. No vascular congestion. Right pleural effusion with basilar infiltration or atelectasis, unchanged. Degenerative changes in the spine. IMPRESSION: Persistent right pleural effusion with basilar infiltration or atelectasis. Electronically Signed   By: Burman Nieves M.D.   On: 08/07/2020 03:19   ECHOCARDIOGRAM COMPLETE  Result Date: 08/09/2020    ECHOCARDIOGRAM REPORT   Patient Name:   [facility] Date of Exam: 08/09/2020 Medical Rec #:  [id number]  Height:       74.0 in Accession #:    [id number] Weight:       166.4 lb Date of Birth:  [birth year] BSA:          2.010 m Patient Age:    73 years   BP:           171/115 mmHg Patient Gender: M          HR:           62 bpm. Exam Location:  Inpatient Procedure: 2D Echo, Cardiac Doppler, Color Doppler and Intracardiac            Opacification Agent Indications:    Dyspnea  History:        Patient  has prior history of Echocardiogram examinations, most                 recent 05/13/2020. CAD, Prior CABG, Mitral Valve Disease and                 Aortic Valve Disease, Arrythmias:Atrial Fibrillation,                 Signs/Symptoms:Shortness of Breath; Risk Factors:Hypertension                 and Former Smoker. S/p AVR. TIA.                 Aortic Valve: valve is present in the aortic position.  Sonographer:    Ross Ludwig RDCS (AE) Referring Phys: [id number] Cecille Po MELVIN IMPRESSIONS  1. Left ventricular ejection fraction, by estimation, is 50 to 55%. The left ventricle has low normal function. The left ventricle has no regional wall motion abnormalities. There is moderate left ventricular hypertrophy. Left ventricular diastolic parameters are indeterminate.  2. Right ventricular systolic function is moderately reduced. The right ventricular size is mildly enlarged. There is severely elevated pulmonary artery systolic pressure. The estimated right ventricular systolic pressure is 71.2 mmHg.  3. Left atrial size was severely dilated.  4. Right atrial size was severely dilated.  5. The mitral valve is grossly normal. Moderate to severe mitral valve regurgitation. The mean mitral valve gradient is 5.0 mmHg with average heart rate of 61 bpm.  6. Tricuspid valve regurgitation is severe.  7. Bioprosethetic valve with mild thickening. Acceleration time 85 msec. DVI 0.33; no perivalvular AI. The aortic valve is calcified. Aortic valve regurgitation is not visualized. There is a valve present in the aortic position.  8. Aortic dilatation noted. There is borderline dilatation of the aortic root, measuring 38 mm.  9. The inferior vena cava is dilated in size with <50% respiratory variability, suggesting right atrial pressure of 15 mmHg. Comparison(s): A prior study was performed on 05/12/20. LV is less  vigorous in this study. Stable Aortic Root. Significant tricuspid and mitral regurgitation with RV dysfuction; worse from  prior. FINDINGS  Left Ventricle: Left ventricular ejection fraction, by estimation, is 50 to 55%. The left ventricle has low normal function. The left ventricle has no regional wall motion abnormalities. Definity contrast agent was given IV to delineate the left ventricular endocardial borders. The left ventricular internal cavity size was normal in size. There is moderate left ventricular hypertrophy. Left ventricular diastolic parameters are indeterminate. Right Ventricle: The right ventricular size is mildly enlarged. No increase in right ventricular wall thickness. Right ventricular systolic function is moderately reduced. There is severely elevated pulmonary artery systolic pressure. The tricuspid regurgitant velocity is 3.75 m/s, and with an assumed right atrial pressure of 15 mmHg, the estimated right ventricular systolic pressure is 71.2 mmHg. Left Atrium: Left atrial size was severely dilated. Right Atrium: Right atrial size was severely dilated. Pericardium: There is no evidence of pericardial effusion. Mitral Valve: The mitral valve is grossly normal. Moderate to severe mitral valve regurgitation. The mean mitral valve gradient is 5.0 mmHg with average heart rate of 61 bpm. Tricuspid Valve: The tricuspid valve is grossly normal. Tricuspid valve regurgitation is severe. Aortic Valve: Bioprosethetic valve with mild thickening. Acceleration time 85 msec. DVI 0.33; no perivalvular AI. The aortic valve is calcified. Aortic valve regurgitation is not visualized. Aortic valve mean gradient measures 6.0 mmHg. Aortic valve peak  gradient measures 11.9 mmHg. Aortic valve area, by VTI measures 1.79 cm. There is a valve present in the aortic position. Pulmonic Valve: The pulmonic valve was not well visualized. Pulmonic valve regurgitation is trivial. Aorta: Aortic dilatation noted. There is borderline dilatation of the aortic root, measuring 38 mm. Venous: The inferior vena cava is dilated in size with less than 50%  respiratory variability, suggesting right atrial pressure of 15 mmHg. IAS/Shunts: The atrial septum is grossly normal.  LEFT VENTRICLE PLAX 2D LVIDd:         5.00 cm LVIDs:         4.10 cm LV PW:         1.30 cm LV IVS:        2.30 cm LVOT diam:     2.40 cm LV SV:         53 LV SV Index:   26 LVOT Area:     4.52 cm  RIGHT VENTRICLE            IVC RV S prime:     3.94 cm/s  IVC diam: 3.00 cm LEFT ATRIUM              Index       RIGHT ATRIUM           Index LA diam:        5.50 cm  2.74 cm/m  RA Area:     31.10 cm LA Vol (A2C):   147.0 ml 73.12 ml/m RA Volume:   115.00 ml 57.21 ml/m LA Vol (A4C):   128.0 ml 63.67 ml/m LA Biplane Vol: 137.0 ml 68.15 ml/m  AORTIC VALVE AV Area (Vmax):    1.75 cm AV Area (Vmean):   1.70 cm AV Area (VTI):     1.79 cm AV Vmax:           172.80 cm/s AV Vmean:          112.800 cm/s AV VTI:            0.297 m AV Peak Grad:  11.9 mmHg AV Mean Grad:      6.0 mmHg LVOT Vmax:         66.76 cm/s LVOT Vmean:        42.340 cm/s LVOT VTI:          0.117 m LVOT/AV VTI ratio: 0.40  AORTA Ao Root diam: 3.80 cm Ao Asc diam:  3.80 cm MITRAL VALVE           TRICUSPID VALVE MV Mean grad: 5.0 mmHg TR Peak grad:   56.2 mmHg                        TR Vmax:        375.00 cm/s                         SHUNTS                        Systemic VTI:  0.12 m                        Systemic Diam: 2.40 cm Riley Lam MD Electronically signed by Riley Lam MD Signature Date/Time: 08/09/2020/2:41:49 PM    Final      Assessment and Plan:  Acute diastolic CHF and Rt pleural effusion/ pulmonary HTN in combination with severe TR and mod to severe MR.  Diuresed some with lasix 40 IV daily now down 1 kg and neg 1.7 L  He has had multiple ER visits.   Increase lasix to 40 mg IV BID.   At home on 20 lasix prn  --at times nebs improve symptoms - he has been given hydrocodone half table as needed.  --would recommend palliative care eval to see if they can provide recommendations to assist pt   If he does not qualify for hom e02 then maybe they can provide.   Rt pl effusion see above.   Mod to severe MR ( hx of MV repair 2015) and severe TR --Pt not a candidate for mitra clip. And per Dr. Royann Shivers and Dr. Mayford Knife given his prior aortic valve replacement, sternotomy, he would be too high risk for surgical repair.   Medical management  Chronic a fib on Eliquis rate controlled   CAD with hx of CABG neg troponin LHC/RHC 11/25 with evidence of severe CAD and occluded SVG. Per CT surgery, not a CABG candidate for severe CAD   S/p AVR with bioprosthetic stable on echo and atrial clip LAAC  Thrombocytopenia chronic.   Essential HTN chronic   HLD  Not on statin.   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  Hx of Cholelithiasis was seen by surgery 05/2020          New York Heart Association (NYHA) Functional Class NYHA Class III   CHA2DS2-VASc Score = 6  This indicates a 9.7% annual risk of stroke. The patient's score is based upon: CHF History: 1 HTN History: 1 Diabetes History: 0 Stroke History: 2 Vascular Disease History: 1 Age Score: 1 Gender Score: 0         For questions or updates, please contact [facility] Please consult [email or web address] for contact info under    Signed, Nada Boozer, NP  08/10/2020 11:55 AM  Personally seen and examined. Agree with above.   74 year old  known to our service with coronary disease CABG mitral valve repair left atrial appendage clipping prior TIA now with moderate to severe mitral regurgitation progressive pleural effusion thrombocytopenia here for worsening shortness of breath heart failure exacerbation.  Resting fairly comfortably in bed, appears thin cachectic, 2/6 holosystolic murmur heard, decreased breath sounds at bases, nonfocal neurologic exam.  Data reviewed including labs as above and prior echocardiogram.  Assessment  and plan  Acute on chronic diastolic heart failure, pleural effusions, secondary pulmonary hypertension with severe TR and moderate to severe mitral regurgitation -Extensive prior notes reviewed.  He has been consulted before in the past by the structural heart team and he is not a candidate for MitraClip or further invasive therapies for his valvular heart disease.  Continue with palliative efforts which include ongoing diuresis.  His blood pressure being elevated hopefully will decrease as intravascular volume is depleted as well.  Continue to encourage palliative care team.  Permanent atrial fibrillation with good overall rate control on Eliquis for anticoagulation.  No bleeding.  Prior CABG with prior occluded SVG.    Aortic valve replacement appears stable with left atrial appendage clipping again.  Continue with diuresis.  Donato Schultz, MD

## 2020-08-11 ENCOUNTER — Ambulatory Visit: Payer: Medicare Other | Admitting: Medical

## 2020-08-11 ENCOUNTER — Ambulatory Visit: Payer: Medicare Other | Admitting: General Practice

## 2020-08-11 DIAGNOSIS — I5033 Acute on chronic diastolic (congestive) heart failure: Secondary | ICD-10-CM | POA: Diagnosis not present

## 2020-08-11 LAB — BASIC METABOLIC PANEL
Anion gap: 9 (ref 5–15)
BUN: 15 mg/dL (ref 8–23)
CO2: 25 mmol/L (ref 22–32)
Calcium: 8.9 mg/dL (ref 8.9–10.3)
Chloride: 104 mmol/L (ref 98–111)
Creatinine, Ser: 1.06 mg/dL (ref 0.61–1.24)
GFR, Estimated: >60 mL/min (ref >60)
Glucose, Bld: 92 mg/dL (ref 70–99)
Potassium: 4 mmol/L (ref 3.5–5.1)
Sodium: 138 mmol/L (ref 135–145)

## 2020-08-11 NOTE — Progress Notes (Addendum)
Progress Note  Patient Name: Lincoln Ginley Date of Encounter: 08/11/2020  Primary Cardiologist: Thurmon Fair, MD  Subjective   Pt reports doing better today but says still "not where he needs to be". No chest pain or SOB   Inpatient Medications    Scheduled Meds: . apixaban  5 mg Oral BID  . carvedilol  6.25 mg Oral BID WC  . furosemide  40 mg Intravenous BID  . hydrALAZINE  25 mg Oral Q8H  . ipratropium  0.5 mg Nebulization 2 times per day  . losartan  100 mg Oral Daily  . pantoprazole  40 mg Oral Q0600  . potassium chloride  40 mEq Oral Daily  . sodium chloride flush  3 mL Intravenous Q12H   Continuous Infusions:  PRN Meds: acetaminophen **OR** acetaminophen, alum & mag hydroxide-simeth, HYDROcodone-acetaminophen   Vital Signs    Vitals:   08/10/20 1956 08/10/20 1958 08/10/20 2314 08/11/20 0429  BP: (!) 142/101 (!) 142/110 (!) 136/112 (!) 139/106  Pulse: 61 61 60 (!) 56  Resp: 20 20 12 17   Temp: 98.4 F (36.9 C)  98.5 F (36.9 C) 98.2 F (36.8 C)  TempSrc: Oral  Oral Oral  SpO2: 97% 100% 100% 97%  Weight:      Height:        Intake/Output Summary (Last 24 hours) at 08/11/2020 0629 Last data filed at 08/10/2020 1900 Gross per 24 hour  Intake 360 ml  Output 2975 ml  Net -2615 ml   Filed Weights   08/08/20 2041 08/09/20 0649 08/10/20 0700  Weight: 74.5 kg 75.5 kg 74.4 kg    Physical Exam   General: Elderly, frail, NAD Neck: Negative for carotid bruits. No JVD Lungs:Clear to ausculation bilaterally. No wheezes, rales, or rhonchi. Breathing is unlabored. Cardiovascular: Irregularly irregular. + murmur Abdomen: Soft, non-tender, non-distended. No obvious abdominal masses. Extremities: No edema. Radial pulses 2+ bilaterally Neuro: Alert and oriented. No focal deficits. No facial asymmetry. MAE spontaneously. Psych: Responds to questions appropriately with normal affect.    Labs    Chemistry Recent Labs  Lab 08/07/20 0239 08/08/20 2027  08/08/20 2120 08/09/20 0701 08/10/20 1422 08/11/20 0115  NA 138   < >  --  140 140 138  K 3.6   < >  --  3.1* 4.2 4.0  CL 102   < >  --  104 102 104  CO2 26   < >  --  26 30 25   GLUCOSE 142*   < >  --  88 127* 92  BUN 27*   < >  --  23 16 15   CREATININE 1.61*   < >  --  1.33* 1.21 1.06  CALCIUM 8.9   < >  --  8.6* 9.0 8.9  PROT 6.9  --  6.6  --   --   --   ALBUMIN 3.4*  --  3.1*  --   --   --   AST 21  --  25  --   --   --   ALT 12  --  12  --   --   --   ALKPHOS 97  --  96  --   --   --   BILITOT 1.7*  --  2.0*  --   --   --   GFRNONAA 45*   < >  --  57* >60 >60  ANIONGAP 10   < >  --  10 8 9    < > =  values in this interval not displayed.     Hematology Recent Labs  Lab 08/07/20 0239 08/08/20 2027 08/09/20 0701  WBC 3.7* 4.0 3.3*  RBC 5.07 5.17 5.09  HGB 12.3* 12.4* 12.1*  HCT 40.7 42.4 40.4  MCV 80.3 82.0 79.4*  MCH 24.3* 24.0* 23.8*  MCHC 30.2 29.2* 30.0  RDW 20.9* 20.8* 20.5*  PLT 142* 127* 129*    Cardiac EnzymesNo results for input(s): TROPONINI in the last 168 hours. No results for input(s): TROPIPOC in the last 168 hours.   BNP Recent Labs  Lab 08/08/20 2120  BNP 1,288.7*     DDimer No results for input(s): DDIMER in the last 168 hours.   Radiology    ECHOCARDIOGRAM COMPLETE  Result Date: 08/09/2020    ECHOCARDIOGRAM REPORT   Patient Name:   [facility] Date of Exam: 08/09/2020 Medical Rec #:  [id number]  Height:       74.0 in Accession #:    [id number] Weight:       166.4 lb Date of Birth:  July 22, 1946 BSA:          2.010 m Patient Age:    74 years   BP:           171/115 mmHg Patient Gender: M          HR:           62 bpm. Exam Location:  Inpatient Procedure: 2D Echo, Cardiac Doppler, Color Doppler and Intracardiac            Opacification Agent Indications:    Dyspnea  History:        Patient has prior history of Echocardiogram examinations, most                 recent 05/13/2020. CAD, Prior CABG, Mitral Valve Disease and                 Aortic Valve  Disease, Arrythmias:Atrial Fibrillation,                 Signs/Symptoms:Shortness of Breath; Risk Factors:Hypertension                 and Former Smoker. S/p AVR. TIA.                 Aortic Valve: valve is present in the aortic position.  Sonographer:    Ross Ludwig RDCS (AE) Referring Phys: [id number] Cecille Po MELVIN IMPRESSIONS  1. Left ventricular ejection fraction, by estimation, is 50 to 55%. The left ventricle has low normal function. The left ventricle has no regional wall motion abnormalities. There is moderate left ventricular hypertrophy. Left ventricular diastolic parameters are indeterminate.  2. Right ventricular systolic function is moderately reduced. The right ventricular size is mildly enlarged. There is severely elevated pulmonary artery systolic pressure. The estimated right ventricular systolic pressure is 71.2 mmHg.  3. Left atrial size was severely dilated.  4. Right atrial size was severely dilated.  5. The mitral valve is grossly normal. Moderate to severe mitral valve regurgitation. The mean mitral valve gradient is 5.0 mmHg with average heart rate of 61 bpm.  6. Tricuspid valve regurgitation is severe.  7. Bioprosethetic valve with mild thickening. Acceleration time 85 msec. DVI 0.33; no perivalvular AI. The aortic valve is calcified. Aortic valve regurgitation is not visualized. There is a valve present in the aortic position.  8. Aortic dilatation noted. There is borderline dilatation of the aortic root, measuring 38 mm.  9. The inferior  vena cava is dilated in size with <50% respiratory variability, suggesting right atrial pressure of 15 mmHg. Comparison(s): A prior study was performed on 05/12/20. LV is less vigorous in this study. Stable Aortic Root. Significant tricuspid and mitral regurgitation with RV dysfuction; worse from prior. FINDINGS  Left Ventricle: Left ventricular ejection fraction, by estimation, is 50 to 55%. The left ventricle has low normal function. The left ventricle  has no regional wall motion abnormalities. Definity contrast agent was given IV to delineate the left ventricular endocardial borders. The left ventricular internal cavity size was normal in size. There is moderate left ventricular hypertrophy. Left ventricular diastolic parameters are indeterminate. Right Ventricle: The right ventricular size is mildly enlarged. No increase in right ventricular wall thickness. Right ventricular systolic function is moderately reduced. There is severely elevated pulmonary artery systolic pressure. The tricuspid regurgitant velocity is 3.75 m/s, and with an assumed right atrial pressure of 15 mmHg, the estimated right ventricular systolic pressure is 71.2 mmHg. Left Atrium: Left atrial size was severely dilated. Right Atrium: Right atrial size was severely dilated. Pericardium: There is no evidence of pericardial effusion. Mitral Valve: The mitral valve is grossly normal. Moderate to severe mitral valve regurgitation. The mean mitral valve gradient is 5.0 mmHg with average heart rate of 61 bpm. Tricuspid Valve: The tricuspid valve is grossly normal. Tricuspid valve regurgitation is severe. Aortic Valve: Bioprosethetic valve with mild thickening. Acceleration time 85 msec. DVI 0.33; no perivalvular AI. The aortic valve is calcified. Aortic valve regurgitation is not visualized. Aortic valve mean gradient measures 6.0 mmHg. Aortic valve peak  gradient measures 11.9 mmHg. Aortic valve area, by VTI measures 1.79 cm. There is a valve present in the aortic position. Pulmonic Valve: The pulmonic valve was not well visualized. Pulmonic valve regurgitation is trivial. Aorta: Aortic dilatation noted. There is borderline dilatation of the aortic root, measuring 38 mm. Venous: The inferior vena cava is dilated in size with less than 50% respiratory variability, suggesting right atrial pressure of 15 mmHg. IAS/Shunts: The atrial septum is grossly normal.  LEFT VENTRICLE PLAX 2D LVIDd:          5.00 cm LVIDs:         4.10 cm LV PW:         1.30 cm LV IVS:        2.30 cm LVOT diam:     2.40 cm LV SV:         53 LV SV Index:   26 LVOT Area:     4.52 cm  RIGHT VENTRICLE            IVC RV S prime:     3.94 cm/s  IVC diam: 3.00 cm LEFT ATRIUM              Index       RIGHT ATRIUM           Index LA diam:        5.50 cm  2.74 cm/m  RA Area:     31.10 cm LA Vol (A2C):   147.0 ml 73.12 ml/m RA Volume:   115.00 ml 57.21 ml/m LA Vol (A4C):   128.0 ml 63.67 ml/m LA Biplane Vol: 137.0 ml 68.15 ml/m  AORTIC VALVE AV Area (Vmax):    1.75 cm AV Area (Vmean):   1.70 cm AV Area (VTI):     1.79 cm AV Vmax:           172.80 cm/s AV Vmean:  112.800 cm/s AV VTI:            0.297 m AV Peak Grad:      11.9 mmHg AV Mean Grad:      6.0 mmHg LVOT Vmax:         66.76 cm/s LVOT Vmean:        42.340 cm/s LVOT VTI:          0.117 m LVOT/AV VTI ratio: 0.40  AORTA Ao Root diam: 3.80 cm Ao Asc diam:  3.80 cm MITRAL VALVE           TRICUSPID VALVE MV Mean grad: 5.0 mmHg TR Peak grad:   56.2 mmHg                        TR Vmax:        375.00 cm/s                         SHUNTS                        Systemic VTI:  0.12 m                        Systemic Diam: 2.40 cm Riley Lam MD Electronically signed by Riley Lam MD Signature Date/Time: 08/09/2020/2:41:49 PM    Final    Telemetry    08/11/20 AF with slow VR, rate in the 50's - Personally Reviewed  ECG    No new tracing as of 08/11/20 - Personally Reviewed  Cardiac Studies   TTE 08/09/20 IMPRESSIONS   1. Left ventricular ejection fraction, by estimation, is 50 to 55%. The  left ventricle has low normal function. The left ventricle has no regional  wall motion abnormalities. There is moderate left ventricular hypertrophy.  Left ventricular diastolic  parameters are indeterminate.  2. Right ventricular systolic function is moderately reduced. The right  ventricular size is mildly enlarged. There is severely elevated pulmonary    artery systolic pressure. The estimated right ventricular systolic  pressure is 71.2 mmHg.  3. Left atrial size was severely dilated.  4. Right atrial size was severely dilated.  5. The mitral valve is grossly normal. Moderate to severe mitral valve  regurgitation. The mean mitral valve gradient is 5.0 mmHg with average  heart rate of 61 bpm.  6. Tricuspid valve regurgitation is severe.  7. Bioprosethetic valve with mild thickening. Acceleration time 85 msec.  DVI 0.33; no perivalvular AI. The aortic valve is calcified. Aortic valve  regurgitation is not visualized. There is a valve present in the aortic  position.  8. Aortic dilatation noted. There is borderline dilatation of the aortic  root, measuring 38 mm.  9. The inferior vena cava is dilated in size with <50% respiratory  variability, suggesting right atrial pressure of 15 mmHg.   Comparison(s): A prior study was performed on 05/12/20. LV is less vigorous  in this study. Stable Aortic Root. Significant tricuspid and mitral  regurgitation with RV dysfuction; worse from prior.    TEE 05/13/20 1. Normal LV function; moderate LVH; severe biatrial enlargement; mild  RVE; s/p AVR with no AS or AI; thickened MV with moderate to severe MR;  moderate to severe TR; small oscillating densities in LV cavity of  uncertain etiology; possible rupture  chordae; would be unusual location for vegetations.  2. Left ventricular ejection fraction, by estimation,  is 55 to 60%. The  left ventricle has normal function. The left ventricle has no regional  wall motion abnormalities. There is moderate left ventricular hypertrophy.  3. Right ventricular systolic function is normal. The right ventricular  size is mildly enlarged.  4. Left atrial size was severely dilated. No left atrial/left atrial  appendage thrombus was detected.  5. Right atrial size was severely dilated.  6. The mitral valve is abnormal. Moderate to severe mitral valve   regurgitation.  7. Tricuspid valve regurgitation is moderate to severe.  8. The aortic valve has been repaired/replaced. Aortic valve  regurgitation is not visualized. No aortic stenosis is present.  9. There is Moderate (Grade III) plaque involving the descending aorta  Rt and Lt cardiac cath 09/10/19   RHC 11/25: RA 2, RV 30/2, PCWP 10, PA 30/14/20,  PA Sat - 55.3% Arterial Sat- 95.7%, PCW Sat 96.3 CO/CI - 2.43/1.31 (TD), 3.87/2.09 (Fick)  LHC 11/25: ZO:XWRU diffuse disease   EAV:[id number] moderate stenosis with circumferential calcification (as seen on IVUS), mid and distal mild-moderate diffuse  YNW:GNFAOZHY mild diffuse disease, distal focal 90% stenosis OM: proximal focal 75% stenosis  QMV:HQIO diffuse disease   LVEDP:  Grafts: SVG-? : ostial occlusion LIMA nonselectively injected showing no anastomosis  IVUS of LM-mid LAD performed for further characterization with above results  Simultaneous LV and Aortic tracings were obtained with a 62F Destination sheath terminating in the ascending aorta and a pigtail catheter in the LV.  Mean Transvalvular Gradient: Aortic Valve Area: 0.85cm2  Conclusions:  Severe CAD as above Occluded SVG  Bioprosthetic aortic valve dysfunction c/f pseudo stenosis vs severe low flow low gradient stenosis Low filling pressures and cardiac output  Patient Profile     74 y.o. male with a hx of chronic a fib (eliquis), CAD with CABG, AVR for AS,and LAA clipping 2015, MVR, TIA, recent sepsis (04/2020- PNA) and mod to severe MR and severe TR with no vegetation on TEE, and thrombocytopenia who is being seen today for the evaluation of CHF at the request of Dr. Blake Divine.  Assessment & Plan    1. Acute on chronic CHF with pleural effusions, pulmonary HTN: -Plan was for IV Lasix  BID and follow response -Weight, 163lb with an admission weight at 165lb  -I&O, net negative 4.1L -Creatinine, 1.06 today>>>improved from  1.21 yesterday  -Currently on IV Lasix  BID>>>would plan on continuing given improved renal function   2. Moderate to severe MR with prior repair in 2015, also with severe TR: -Not felt to be a candidate for mitraclip, also not a candidate for repeat sternotomy as this would be high risk in the setting of co morbid condition -Continue medical management>>palliatiave care following   3. Permanent atrial fibrillation: -AF with SVR>>rates in the 50's -Anticoagulated with Eliquis  BID with stable labs -No reports of acute bleeding    4. CAD with prior CABG: -R/LHC 09/10/2019 with severe CAD, occluded SVG>>>TCTSconsulted with no plans for repeat CABG  -Denies anginal symptoms -Continue carvedilol, no ASA in the setting of Eliquis   5. Hx of AVR: -Prior replacement with LA appendage clipping    6. Thrombocytopenia: -Chronic issue   Signed, Georgie Chard NP-[facility] Pager: [phone] 08/11/2020, 6:29 AM     For questions or updates, please contact   Please consult [email or web address] for contact info under Cardiology/STEMI.  Personally seen and examined. Agree with above.   Feeling a little bit better but not at baseline.  Lungs still decreased  breath sounds at bases.  He is alert.  Elderly and frail.  Creatinine stable at 1.06 down from 1.33 on admit.  Assessment and plan:  Acute on chronic diastolic heart failure with pleural effusions secondary pulmonary pretension with moderate to severe mitral regurgitation -Nonsurgical candidate -Essentially providing palliation at this point with ongoing diuresis. -No changes made in medications today.  Permanent atrial fibrillation -Well rate controlled with Eliquis on board for anticoagulation.  CAD post CABG -Not a repeat surgical candidate.  No aspirin because of Eliquis.  Aortic valve replacement, prior mitral valve repair, left atrial appendage clipping  Donato SchultzMark Quaran Kedzierski, MD

## 2020-08-11 NOTE — Progress Notes (Signed)
PROGRESS NOTE    Carlos Welch  JAS:[id number] DOB: Oct 29, 1945 DOA: 08/08/2020 PCP: [name], PA-C    Chief Complaint  Patient presents with  . Chest Pain    Brief Narrative:  74 year old gentleman prior history of diastolic heart failure, aortic valve stenosis s/p valve replacement, atrial fibrillation on Eliquis, coronary artery disease s/p CABG, hypertension, thrombocytopenia, severe mitral valve regurgitation, history of TIA, BPH presents to the ED for worsening shortness of breath, worsening pedal edema, orthopnea, dyspnea on exertion, weight gain, elevated BNP.  He was admitted for acute on chronic diastolic heart failure exacerbation.  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  Fluid and salt restriction. Echocardiogram showed presesrved left ventricular EF with moderate to severe MR, . Cardiology consulted, suggested not a candidate for any surgical repair of the MV/ SURGICAL mitral clip. Recommend medical management.  I/o net - 4.2 lit since admission.  Continue with Coreg and losartan and hdyralazine. Recommend to continue with diuresis for another 24 hours and transition to oral lasix tomorrow.    History of atrial fibrillation, rate controlled on Eliquis for anticoagulation and on Coreg for rate control.    History of coronary artery disease s/p CABG Aortic valve stenosis s/p AV replacement. Severe  mitral valve regurgitation- nt a candidate for mitral clip and sternotomy is highly risky, hence medical management.  Patient currently denies any chest pain, continue with Eliquis, Coreg and losartan.      Pancytopenia Unclear etiology, monitor counts.    Essential hypertension Blood pressure parameters are well controlled. On coreg 6/25 mg BID, losartan 100 mg daily and added hydralazine 25 mg TID.  Marland Kitchen   Hypokalemia Replaced    AKI Probably secondary to CHF, creatinine improved from 1.3-1.06.    DVT prophylaxis: Eliquis  code Status: Full code Family Communication: None at bedside Disposition:   Status is: Observation  The patient will require care spanning > 2 midnights and should be moved to inpatient because: IV treatments appropriate due to intensity of illness or inability to take PO  Dispo: The patient is from: Home              Anticipated d/c is to: Home              Anticipated d/c date is: 1 day              Patient currently is not medically stable to d/c.       Consultants:   Cardiology    Procedures: echocardiogram.  Antimicrobials: none.   Subjective: We were able to wean him off oxygen. Patient currently denies any chest pain or shortness of breath, no nausea vomiting  Objective: Vitals:   08/11/20 0429 08/11/20 0635 08/11/20 0811 08/11/20 1145  BP: (!) 139/106 (!) 156/115 131/89 139/85  Pulse: (!) 56  60 (!) 56  Resp: 17  20 19   Temp: 98.2 F (36.8 C)  97.6 F (36.4 C) 97.6 F (36.4 C)  TempSrc: Oral  Oral Oral  SpO2: 97%  99% 94%  Weight:  74.2 kg    Height:        Intake/Output Summary (Last 24 hours) at 08/11/2020 1241 Last data filed at 08/11/2020 1000 Gross per 24 hour  Intake 360 ml  Output 2625 ml  Net -2265 ml   Filed Weights   08/09/20 0649 08/10/20 0700 08/11/20 0635  Weight: 75.5 kg 74.4 kg 74.2 kg    Examination:  General exam: Alert and comfortable, not in distress on room air Respiratory system:  Diminished air entry at bases, no tachypnea no wheezing heard Cardiovascular system: S1-S2 heard, improving leg edema, irregularly irregular. Gastrointestinal system: Abdomen is soft, nontender, nondistended, bowel sounds normal Central nervous system: Alert and oriented Extremities: Pedal edema present  skin: No rashes seen Psychiatry: Is appropriate  Data Reviewed: I have personally reviewed following labs and imaging studies  CBC: Recent Labs  Lab 08/07/20 0239 08/08/20 2027 08/09/20 0701  WBC 3.7* 4.0 3.3*  NEUTROABS 2.4  --   --   HGB 12.3* 12.4* 12.1*  HCT 40.7 42.4 40.4  MCV 80.3 82.0 79.4*  PLT 142* 127* 129*    Basic Metabolic Panel: Recent Labs  Lab 08/07/20 0239 08/08/20 2027 08/09/20 0701 08/10/20 1422 08/11/20 0115  NA 138 138 140 140 138  K 3.6 3.8 3.1* 4.2 4.0  CL 102 105 104 102 104  CO2 26 22 26 30 25   GLUCOSE 142* 96 88 127* 92  BUN 27* 24* 23 16 15   CREATININE 1.61* 1.51* 1.33* 1.21 1.06  CALCIUM 8.9 8.7* 8.6* 9.0 8.9  MG  --   --  1.8  --   --     GFR: Estimated Creatinine Clearance: 65.1 mL/min (by C-G formula based on SCr of 1.06 mg/dL).  Liver Function Tests: Recent Labs  Lab 08/07/20 0239 08/08/20 2120  AST 21 25  ALT 12 12  ALKPHOS 97 96  BILITOT 1.7* 2.0*  PROT 6.9 6.6  ALBUMIN 3.4* 3.1*    CBG: Recent Labs  Lab 08/09/20 2119  GLUCAP 102*     Recent Results (from the past 240 hour(s))  Respiratory Panel by RT PCR (Flu A&B, Covid) - Nasopharyngeal Swab     Status: None   Collection Time: 08/07/20  2:47 AM   Specimen: Nasopharyngeal Swab  Result Value Ref Range Status   SARS Coronavirus 2 by RT PCR NEGATIVE NEGATIVE Final    Comment: (NOTE) SARS-CoV-2 target nucleic acids are NOT DETECTED.  The SARS-CoV-2 RNA is generally detectable in upper respiratoy specimens during the acute phase of infection. The lowest concentration of SARS-CoV-2 viral copies this assay can detect is 131 copies/mL. A negative result does not  preclude SARS-Cov-2 infection and should not be used as the sole basis for treatment or other patient management decisions. A negative result may occur with  improper specimen collection/handling, submission of specimen other than nasopharyngeal swab, presence of viral mutation(s) within the areas targeted by this assay, and inadequate number of viral copies (<131 copies/mL). A negative result must be combined with clinical observations, patient history, and epidemiological information. The expected result is Negative.  Fact Sheet for Patients:  https://www.moore.com/https://www.fda.gov/media/142436/download  Fact Sheet for Healthcare Providers:  https://www.young.biz/https://www.fda.gov/media/142435/download  This test is no t yet approved or cleared by the [place] FDA and  has been authorized for detection and/or diagnosis of SARS-CoV-2 by FDA under an Emergency Use Authorization (EUA). This EUA will remain  in effect (meaning this test  can be used) for the duration of the COVID-19 declaration under Section 564(b)(1) of the Act, 21 U.S.C. section 360bbb-3(b)(1), unless the authorization is terminated or revoked sooner.     Influenza A by PCR NEGATIVE NEGATIVE Final   Influenza B by PCR NEGATIVE NEGATIVE Final    Comment: (NOTE) The Xpert Xpress SARS-CoV-2/FLU/RSV assay is intended as an aid in  the diagnosis of influenza from Nasopharyngeal swab specimens and  should not be used as a sole basis for treatment. Nasal washings and  aspirates are unacceptable for Xpert Xpress SARS-CoV-2/FLU/RSV  testing.  Fact Sheet for Patients: https://www.moore.com/  Fact Sheet for Healthcare Providers: https://www.young.biz/  This test is not yet approved or cleared by the [place] FDA and  has been authorized for detection and/or diagnosis of SARS-CoV-2 by  FDA under an Emergency Use Authorization (EUA). This EUA will remain  in effect (meaning this test can be used) for the  duration of the  Covid-19 declaration under Section 564(b)(1) of the Act, 21  U.S.C. section 360bbb-3(b)(1), unless the authorization is  terminated or revoked. Performed at [facility], [address] [place], [place] [address]   Respiratory Panel by RT PCR (Flu A&B, Covid) - Nasopharyngeal Swab     Status: None   Collection Time: 08/08/20 11:26 PM   Specimen: Nasopharyngeal Swab  Result Value Ref Range Status   SARS Coronavirus 2 by RT PCR NEGATIVE NEGATIVE Final    Comment: (NOTE) SARS-CoV-2 target nucleic acids are NOT DETECTED.  The SARS-CoV-2 RNA is generally detectable in upper respiratoy specimens during the acute phase of infection. The lowest concentration of SARS-CoV-2 viral copies this assay can detect is 131 copies/mL. A negative result does not preclude SARS-Cov-2 infection and should not be used as the sole basis for treatment or other patient management decisions. A negative result may occur with  improper specimen collection/handling, submission of specimen other than nasopharyngeal swab, presence of viral mutation(s) within the areas targeted by this assay, and inadequate number of viral copies (<131 copies/mL). A negative result must be combined with clinical observations, patient history, and epidemiological information. The expected result is Negative.  Fact Sheet for Patients:  https://www.moore.com/  Fact Sheet for Healthcare Providers:  https://www.young.biz/  This test is no t yet approved or cleared by the [place] FDA and  has been authorized for detection and/or diagnosis of SARS-CoV-2 by FDA under an Emergency Use Authorization (EUA). This EUA will remain  in effect (meaning this test can be used) for the duration of the COVID-19 declaration under Section 564(b)(1) of the Act, 21 U.S.C. section 360bbb-3(b)(1), unless the authorization is terminated or revoked sooner.     Influenza A by PCR  NEGATIVE NEGATIVE Final   Influenza B by PCR NEGATIVE NEGATIVE Final    Comment: (NOTE) The Xpert Xpress SARS-CoV-2/FLU/RSV assay is intended as an aid in  the diagnosis of influenza from Nasopharyngeal swab specimens and  should not be used as a sole basis for treatment. Nasal washings and  aspirates are unacceptable for Xpert Xpress SARS-CoV-2/FLU/RSV  testing.  Fact Sheet for Patients: https://www.moore.com/  Fact Sheet for Healthcare Providers: https://www.young.biz/  This test is not yet approved or cleared by the [place] FDA and  has been authorized for detection and/or diagnosis of SARS-CoV-2 by  FDA under an Emergency Use Authorization (EUA). This EUA will remain  in effect (meaning this test can be used) for the duration of the  Covid-19 declaration under Section 564(b)(1) of  the Act, 21  U.S.C. section 360bbb-3(b)(1), unless the authorization is  terminated or revoked. Performed at [facility], [address] [place], [place] [address]          Radiology Studies: No results found.      Scheduled Meds: . apixaban  5 mg Oral BID  . carvedilol  6.25 mg Oral BID WC  . furosemide  40 mg Intravenous BID  . hydrALAZINE  25 mg Oral Q8H  . ipratropium  0.5 mg Nebulization 2 times per day  . losartan  100 mg Oral Daily  . pantoprazole  40 mg Oral Q0600  . potassium chloride  40 mEq Oral Daily  . sodium chloride flush  3 mL Intravenous Q12H   Continuous Infusions:   LOS: 2 days       Kathlen Mody, MD [facility]   To contact the attending provider between 7A-7P or the covering provider during after hours 7P-7A, please log into the web site [email or web address] and access using universal [place] password for that web site. If you do not have the password, please call the hospital operator.  08/11/2020, 12:41 PM

## 2020-08-11 NOTE — Evaluation (Signed)
Occupational Therapy Evaluation Patient Details Name: Carlos Welch MRN: [id number] DOB: [birth year] Today's Date: 08/11/2020    History of Present Illness 74 y.o. male with medical history significant of heart failure with preserved ejection fraction, aortic valve stenosis status post valve replacement, BPH, A. fib, CAD status post CABG, hypertension, thrombocytopenia, TIA, and mitral valve regurgitation who presents with several days of worsening shortness of breath. Presents to ED 10/24 with orthopnea, dyspnea on exertion, worsening pedal edema, pulse in 50s and elevated BNP. Admitted for treatment of acute on chronic diastolic heart failure    Clinical Impression   Pt lives with his supportive daughter who works from home. He walks with either a cane or RW and is assisted for showering and all IADL. Pt currently on RA with Sp02 of 96%.  Pt demonstrates decreased activity tolerance and impaired standing balance. He requires up to min guard assist for mobility and ADL. Recommended shower seat, but pt could also benefit from a 3 in 1 which could serve multiple purposes. Pt not receptive to seated showering, but will continue to educate in fall prevention and energy conservation while showering.   Follow Up Recommendations  Home health OT    Equipment Recommendations  3 in 1 bedside commode    Recommendations for Other Services       Precautions / Restrictions Precautions Precautions: Fall Restrictions Weight Bearing Restrictions: No      Mobility Bed Mobility Overal bed mobility: Needs Assistance Bed Mobility: Supine to Sit     Supine to sit: Min assist     General bed mobility comments: pt in chair    Transfers Overall transfer level: Needs assistance Equipment used: Rolling walker (2 wheeled) Transfers: Sit to/from Stand Sit to Stand: Supervision         General transfer comment: verbal cues for hand placement and for safety, heavy reliance on UEs to push into  standing    Balance Overall balance assessment: Needs assistance Sitting-balance support: Feet supported;No upper extremity supported Sitting balance-Leahy Scale: Good     Standing balance support: Single extremity supported;Bilateral upper extremity supported;During functional activity Standing balance-Leahy Scale: Poor Standing balance comment: requires at least single UE support                           ADL either performed or assessed with clinical judgement   ADL Overall ADL's : Needs assistance/impaired Eating/Feeding: Independent   Grooming: Standing;Wash/dry hands;Min guard Grooming Details (indicate cue type and reason): leans against sink to stabilize Upper Body Bathing: Set up;Sitting   Lower Body Bathing: Sit to/from stand;Min guard   Upper Body Dressing : Set up;Sitting   Lower Body Dressing: Sit to/from stand;Min guard   Toilet Transfer: Ambulation;RW;Min guard   Toileting- Clothing Manipulation and Hygiene: Sit to/from stand;Min guard       Functional mobility during ADLs: Rolling walker;Min guard General ADL Comments: recommended sitting to shower, pt states standing with help of daughter is going fine     Vision Baseline Vision/History: Wears glasses Wears Glasses: At all times Patient Visual Report: No change from baseline       Perception     Praxis      Pertinent Vitals/Pain Pain Assessment: Faces Pain Score: 3  Faces Pain Scale: Hurts a little bit Pain Location: LEs Pain Descriptors / Indicators: Tender Pain Intervention(s): Monitored during session;Repositioned     Hand Dominance Right   Extremity/Trunk Assessment Upper Extremity Assessment Upper Extremity Assessment: Overall  WFL for tasks assessed   Lower Extremity Assessment Lower Extremity Assessment: Defer to PT evaluation   Cervical / Trunk Assessment Cervical / Trunk Assessment: Kyphotic   Communication Communication Communication: No difficulties    Cognition Arousal/Alertness: Awake/alert Behavior During Therapy: WFL for tasks assessed/performed Overall Cognitive Status: Within Functional Limits for tasks assessed                                 General Comments: pt likes to day trade on his laptop   General Comments  pt on 2L O2 via [place] on entry with SaO2 98%O2, removed supplemental O2 and pt able to maintain SaO2 94%O2 at rest, dropped to 88%O2 with ambulation however quickly rebounded to 92%O2 with sitting in recliner, increased dry skin on bilateral LE where pt has been diuresed,     Exercises     Shoulder Instructions      Home Living Family/patient expects to be discharged to:: Private residence Living Arrangements: Children Available Help at Discharge: Family;Available 24 hours/day Type of Home: House Home Access: Stairs to enter [employer] of Steps: 1   Home Layout: One level     Bathroom Shower/Tub: Producer, television/film/video: Standard Bathroom Accessibility: Yes   Home Equipment: Grab bars - tub/shower;Hand held shower head;Cane - single point;Walker - 2 wheels          Prior Functioning/Environment Level of Independence: Needs assistance  Gait / Transfers Assistance Needed: uses RW for household ambulation when fluid builds up ADL's / Homemaking Assistance Needed: daughter assists with bathing, cooking, pt able to dress himself            OT Problem List: Decreased strength;Decreased activity tolerance;Impaired balance (sitting and/or standing);Decreased knowledge of use of DME or AE;Pain      OT Treatment/Interventions: Self-care/ADL training;Energy conservation;DME and/or AE instruction;Patient/family education;Balance training;Therapeutic activities    OT Goals(Current goals can be found in the care plan section) Acute Rehab OT Goals Patient Stated Goal: walk without SoB OT Goal Formulation: With patient Time For Goal Achievement: 08/25/20 Potential to Achieve  Goals: Good ADL Goals Pt Will Perform Grooming: with modified independence;standing Pt Will Perform Lower Body Bathing: with modified independence;sit to/from stand Pt Will Perform Lower Body Dressing: with modified independence;sit to/from stand Pt Will Transfer to Toilet: with modified independence;ambulating;bedside commode (over toilet) Pt Will Perform Toileting - Clothing Manipulation and hygiene: with modified independence;sit to/from stand Pt Will Perform Tub/Shower Transfer: Shower transfer;with supervision;ambulating;3 in 1;rolling walker Additional ADL Goal #1: Pt will state at least 3 energy conservation strategies as instructed.  OT Frequency: Min 2X/week   Barriers to D/C:            Co-evaluation              AM-PAC OT "6 Clicks" Daily Activity     Outcome Measure Help from another person eating meals?: None Help from another person taking care of personal grooming?: A Little Help from another person toileting, which includes using toliet, bedpan, or urinal?: A Little Help from another person bathing (including washing, rinsing, drying)?: A Little Help from another person to put on and taking off regular upper body clothing?: None Help from another person to put on and taking off regular lower body clothing?: A Little 6 Click Score: 20   End of Session Equipment Utilized During Treatment: Rolling walker;Gait belt Nurse Communication: Mobility status  Activity Tolerance: Patient tolerated treatment  well Patient left: in chair;with call bell/phone within reach;with nursing/sitter in room  OT Visit Diagnosis: Unsteadiness on feet (R26.81);Other abnormalities of gait and mobility (R26.89);Muscle weakness (generalized) (M62.81);Other (comment) (decreased activity tolerance)                Time: 1275-1700 OT Time Calculation (min): 16 min Charges:  OT General Charges $OT Visit: 1 Visit OT Evaluation $OT Eval Moderate Complexity: 1 Mod  Martie Round,  OTR/L Acute Rehabilitation Services Pager: [phone] Office: [phone]  Evern Bio 08/11/2020, 12:03 PM

## 2020-08-11 NOTE — Evaluation (Signed)
Physical Therapy Evaluation Patient Details Name: Carlos Welch MRN: [id number] DOB: [birth year] Today's Date: 08/11/2020   History of Present Illness  74 y.o. male with medical history significant of heart failure with preserved ejection fraction, aortic valve stenosis status post valve replacement, BPH, A. fib, CAD status post CABG, hypertension, thrombocytopenia, TIA, and mitral valve regurgitation who presents with several days of worsening shortness of breath. Presents to ED 10/24 with orthopnea, dyspnea on exertion, worsening pedal edema, pulse in 50s and elevated BNP. Admitted for treatment of acute on chronic diastolic heart failure   Clinical Impression  PTA pt living with daughter in single story home with 1 step to enter. Pt reports he walks in the house with either a cane or a RW depending on how he is feeling. Pt is able to dress himself, and requires occassional assist from daughter for bathing. Daughter provides for iADLs. Pt is currently limited in safe mobility by slight DoE, generalized weakness and decreased endurance. Pt is minA for bed mobility, min guard for transfers and min guard for ambulation of 30 feet with RW. PT recommends HHPT for improving strength and endurance to safely navigate in his home environment. PT will continue to follow acutely.     Follow Up Recommendations Home health PT;Supervision for mobility/OOB    Equipment Recommendations  None recommended by PT       Precautions / Restrictions Precautions Precautions: Fall Restrictions Weight Bearing Restrictions: No      Mobility  Bed Mobility Overal bed mobility: Needs Assistance Bed Mobility: Supine to Sit     Supine to sit: Min assist     General bed mobility comments: min A for pt to pull up against therapist to come to upright, pt able to square hips to EoB without assist    Transfers Overall transfer level: Needs assistance Equipment used: Rolling walker (2 wheeled) Transfers: Sit to/from  Stand Sit to Stand: Min guard         General transfer comment: min guard for safety with power up and steadying, vc for hand placement to power up   Ambulation/Gait Ambulation/Gait assistance: Min guard Gait Distance (Feet): 25 Feet Assistive device: Rolling walker (2 wheeled) Gait Pattern/deviations: Step-through pattern;Decreased step length - right;Decreased step length - left;Trunk flexed;Shuffle Gait velocity: slowed Gait velocity interpretation: <1.31 ft/sec, indicative of household ambulator General Gait Details: min guard for slow, shuffling gait, vc for proximity to RW and upright posture        Balance Overall balance assessment: Needs assistance Sitting-balance support: Feet supported;No upper extremity supported Sitting balance-Leahy Scale: Good     Standing balance support: Single extremity supported;Bilateral upper extremity supported;During functional activity Standing balance-Leahy Scale: Poor Standing balance comment: requires at least single UE support                             Pertinent Vitals/Pain Pain Assessment: 0-10 Pain Score: 3  Pain Location: abdomen and LE with edema Pain Descriptors / Indicators: Pressure Pain Intervention(s): Limited activity within patient's tolerance;Monitored during session;Repositioned    Home Living Family/patient expects to be discharged to:: Private residence Living Arrangements: Children Available Help at Discharge: Family;Available 24 hours/day Type of Home: House Home Access: Stairs to enter   [employer] of Steps: 1 Home Layout: One level Home Equipment: Grab bars - tub/shower      Prior Function Level of Independence: Needs assistance   Gait / Transfers Assistance Needed: uses RW for household ambulation  when fluid builds up  ADL's / Homemaking Assistance Needed: daughter assists with bathing, cooking, pt able to dress himself           Extremity/Trunk Assessment   Upper  Extremity Assessment Upper Extremity Assessment: Generalized weakness    Lower Extremity Assessment Lower Extremity Assessment: Generalized weakness       Communication   Communication: No difficulties  Cognition Arousal/Alertness: Awake/alert Behavior During Therapy: WFL for tasks assessed/performed Overall Cognitive Status: Within Functional Limits for tasks assessed                                        General Comments General comments (skin integrity, edema, etc.): pt on 2L O2 via  on entry with SaO2 98%O2, removed supplemental O2 and pt able to maintain SaO2 94%O2 at rest, dropped to 88%O2 with ambulation however quickly rebounded to 92%O2 with sitting in recliner, increased dry skin on bilateral LE where pt has been diuresed,         Assessment/Plan    PT Assessment Patient needs continued PT services  PT Problem List Decreased strength;Decreased activity tolerance;Decreased balance;Decreased mobility;Cardiopulmonary status limiting activity       PT Treatment Interventions DME instruction;Gait training;Functional mobility training;Therapeutic activities;Therapeutic exercise;Balance training;Cognitive remediation;Patient/family education    PT Goals (Current goals can be found in the Care Plan section)  Acute Rehab PT Goals Patient Stated Goal: walk without SoB PT Goal Formulation: With patient Time For Goal Achievement: 08/25/20 Potential to Achieve Goals: Good    Frequency Min 3X/week    AM-PAC PT "6 Clicks" Mobility  Outcome Measure Help needed turning from your back to your side while in a flat bed without using bedrails?: None Help needed moving from lying on your back to sitting on the side of a flat bed without using bedrails?: A Little Help needed moving to and from a bed to a chair (including a wheelchair)?: None Help needed standing up from a chair using your arms (e.g., wheelchair or bedside chair)?: None Help needed to walk in  hospital room?: None Help needed climbing 3-5 steps with a railing? : A Little 6 Click Score: 22    End of Session Equipment Utilized During Treatment: Gait belt Activity Tolerance: Patient tolerated treatment well Patient left: in chair;with call bell/phone within reach Nurse Communication: Mobility status;Other (comment) (able to maintain SaO2 on RA) PT Visit Diagnosis: Other abnormalities of gait and mobility (R26.89);Muscle weakness (generalized) (M62.81);Difficulty in walking, not elsewhere classified (R26.2)    Time: 5956-3875 PT Time Calculation (min) (ACUTE ONLY): 38 min   Charges:   PT Evaluation $PT Eval Moderate Complexity: 1 Mod PT Treatments $Therapeutic Exercise: 8-22 mins $Therapeutic Activity: 8-22 mins        Donnetta Gillin B. Beverely Risen PT, DPT Acute Rehabilitation Services Pager [phone] Office [phone]   Elon Alas Fleet 08/11/2020, 10:50 AM

## 2020-08-11 NOTE — TOC Progression Note (Signed)
Transition of Care [facility]) - Progression Note    Patient Details  Name: Carlos Welch MRN: [id number] Date of Birth: March 31, 1946  Transition of Care [facility] CM/SW Contact  Lawerance Sabal, RN Phone Number: 08/11/2020, 2:30 PM  Clinical Narrative:   Sherron Monday w patient's daughter Herbert Seta again. Discussed PT recs for [facility]. Heather agreeable to [facility] services, discussed companies and ratings, no preference, referral accepted by Ethelda Chick [facility] for [facility] PT OT HHA, Will need HH orders.      Expected Discharge Plan: Home w Home Health Services Barriers to Discharge: Continued Medical Work up  Expected Discharge Plan and Services Expected Discharge Plan: Home w Home Health Services   Discharge Planning Services: CM Consult Post Acute Care Choice: Home Health Living arrangements for the past 2 months: Single Family Home                           HH Arranged: PT, OT, Nurse's Aide HH Agency: [facility] Date HH Agency Contacted: 08/11/20 Time HH Agency Contacted: 1429 Representative spoke with at [facility] Agency: Erin   Social Determinants of Health (SDOH) Interventions    Readmission Risk Interventions No flowsheet data found.

## 2020-08-12 DIAGNOSIS — I5033 Acute on chronic diastolic (congestive) heart failure: Secondary | ICD-10-CM | POA: Diagnosis not present

## 2020-08-12 MED ORDER — IPRATROPIUM BROMIDE 0.02 % IN SOLN: 0.5000 mg | RESPIRATORY_TRACT | Status: DC | PRN

## 2020-08-12 NOTE — Progress Notes (Signed)
Mobility Specialist - Progress Note   08/12/20 1701  Mobility  Activity Ambulated in room  Level of Assistance Standby assist, set-up cues, supervision of patient - no hands on  Assistive Device Front wheel walker  Distance Ambulated (ft) 20 ft  Mobility Response Tolerated well  Mobility performed by Mobility specialist  $Mobility charge 1 Mobility    Pre-mobility  1 L O2: 57 HR, 99% SpO2  RA: 59 HR, 96% SpO2 During mobility, RA: 68 HR, 93% SpO2 Post-mobility, RA: 55 HR, 96% SpO2  Pt asx throughout ambulation, he did not want to ambulate any more. Pt left on RA, RN notified.   Carlos Welch Mobility Specialist Mobility Specialist Phone: [phone]

## 2020-08-12 NOTE — Care Management (Signed)
1040 08-12-20 Order is in for bedside commode. Case Manager spoke with patient and the patient declined the bedside commode. He states he can walk to the bathroom in the home. Gala Lewandowsky, RN,BSN Case Manager

## 2020-08-12 NOTE — Progress Notes (Addendum)
Patient does not want to walk until after 4 pm because he needs to keep monitoring the stock market.  I explained that he will have to walk in hall later to evaluate his oxygen needs. Pt currently with nasal canula at 1 liter sating 95-99%.  Patient removes and then puts back on when he feels like he needs it. Pt resting with call bell within reach.  Will continue to monitor.

## 2020-08-12 NOTE — Progress Notes (Addendum)
Progress Note  Patient Name: Carlos Welch Date of Encounter: 08/12/2020  Primary Cardiologist: Thurmon Fair, MD  Subjective   Feels better today. No chest pain or SOB. Ambulates in the room and sits in chair during the day.   Inpatient Medications    Scheduled Meds: . apixaban  5 mg Oral BID  . carvedilol  6.25 mg Oral BID WC  . furosemide  40 mg Intravenous BID  . hydrALAZINE  25 mg Oral Q8H  . ipratropium  0.5 mg Nebulization 2 times per day  . losartan  100 mg Oral Daily  . pantoprazole  40 mg Oral Q0600  . potassium chloride  40 mEq Oral Daily  . sodium chloride flush  3 mL Intravenous Q12H   Continuous Infusions:  PRN Meds: acetaminophen **OR** acetaminophen, alum & mag hydroxide-simeth, HYDROcodone-acetaminophen   Vital Signs    Vitals:   08/11/20 2025 08/11/20 2053 08/11/20 2314 08/12/20 0500  BP: 137/87  (!) 137/104   Pulse: 64  61 (!) 54  Resp: 15  18   Temp: 98 F (36.7 C)     TempSrc: Oral     SpO2: 100% 99% 100% 99%  Weight:    64.6 kg  Height:        Intake/Output Summary (Last 24 hours) at 08/12/2020 0723 Last data filed at 08/12/2020 0504 Gross per 24 hour  Intake 240 ml  Output 2300 ml  Net -2060 ml   Filed Weights   08/10/20 0700 08/11/20 0635 08/12/20 0500  Weight: 74.4 kg 74.2 kg 64.6 kg    Physical Exam   General: Elderly, NAD Neck: Negative for carotid bruits. No JVD Lungs:Clear to ausculation bilaterally. Breathing is unlabored. Cardiovascular: Irregularly irregular. + murmurs Abdomen: Soft, non-tender, non-distended. No obvious abdominal masses. Extremities: No edema. Radial pulses 2+ bilaterally Neuro: Alert and oriented. No focal deficits. No facial asymmetry. MAE spontaneously. Psych: Responds to questions appropriately with normal affect.    Labs    Chemistry Recent Labs  Lab 08/07/20 0239 08/08/20 2027 08/08/20 2120 08/09/20 0701 08/10/20 1422 08/11/20 0115  NA 138   < >  --  140 140 138  K 3.6   < >   --  3.1* 4.2 4.0  CL 102   < >  --  104 102 104  CO2 26   < >  --  26 30 25   GLUCOSE 142*   < >  --  88 127* 92  BUN 27*   < >  --  23 16 15   CREATININE 1.61*   < >  --  1.33* 1.21 1.06  CALCIUM 8.9   < >  --  8.6* 9.0 8.9  PROT 6.9  --  6.6  --   --   --   ALBUMIN 3.4*  --  3.1*  --   --   --   AST 21  --  25  --   --   --   ALT 12  --  12  --   --   --   ALKPHOS 97  --  96  --   --   --   BILITOT 1.7*  --  2.0*  --   --   --   GFRNONAA 45*   < >  --  57* >60 >60  ANIONGAP 10   < >  --  10 8 9    < > = values in this interval not displayed.  Hematology Recent Labs  Lab 08/07/20 0239 08/08/20 2027 08/09/20 0701  WBC 3.7* 4.0 3.3*  RBC 5.07 5.17 5.09  HGB 12.3* 12.4* 12.1*  HCT 40.7 42.4 40.4  MCV 80.3 82.0 79.4*  MCH 24.3* 24.0* 23.8*  MCHC 30.2 29.2* 30.0  RDW 20.9* 20.8* 20.5*  PLT 142* 127* 129*    Cardiac EnzymesNo results for input(s): TROPONINI in the last 168 hours. No results for input(s): TROPIPOC in the last 168 hours.   BNP Recent Labs  Lab 08/08/20 2120  BNP 1,288.7*     DDimer No results for input(s): DDIMER in the last 168 hours.   Radiology    No results found.  Telemetry    08/12/20 AF with SVR with rates in the 50's  - Personally Reviewed  ECG    No new tracing as of 08/12/20- Personally Reviewed  Cardiac Studies   TTE 08/09/20 IMPRESSIONS   1. Left ventricular ejection fraction, by estimation, is 50 to 55%. The  left ventricle has low normal function. The left ventricle has no regional  wall motion abnormalities. There is moderate left ventricular hypertrophy.  Left ventricular diastolic  parameters are indeterminate.  2. Right ventricular systolic function is moderately reduced. The right  ventricular size is mildly enlarged. There is severely elevated pulmonary  artery systolic pressure. The estimated right ventricular systolic  pressure is 71.2 mmHg.  3. Left atrial size was severely dilated.  4. Right atrial size was  severely dilated.  5. The mitral valve is grossly normal. Moderate to severe mitral valve  regurgitation. The mean mitral valve gradient is 5.0 mmHg with average  heart rate of 61 bpm.  6. Tricuspid valve regurgitation is severe.  7. Bioprosethetic valve with mild thickening. Acceleration time 85 msec.  DVI 0.33; no perivalvular AI. The aortic valve is calcified. Aortic valve  regurgitation is not visualized. There is a valve present in the aortic  position.  8. Aortic dilatation noted. There is borderline dilatation of the aortic  root, measuring 38 mm.  9. The inferior vena cava is dilated in size with <50% respiratory  variability, suggesting right atrial pressure of 15 mmHg.   Comparison(s): A prior study was performed on 05/12/20. LV is less vigorous  in this study. Stable Aortic Root. Significant tricuspid and mitral  regurgitation with RV dysfuction; worse from prior.    TEE 05/13/20 1. Normal LV function; moderate LVH; severe biatrial enlargement; mild  RVE; s/p AVR with no AS or AI; thickened MV with moderate to severe MR;  moderate to severe TR; small oscillating densities in LV cavity of  uncertain etiology; possible rupture  chordae; would be unusual location for vegetations.  2. Left ventricular ejection fraction, by estimation, is 55 to 60%. The  left ventricle has normal function. The left ventricle has no regional  wall motion abnormalities. There is moderate left ventricular hypertrophy.  3. Right ventricular systolic function is normal. The right ventricular  size is mildly enlarged.  4. Left atrial size was severely dilated. No left atrial/left atrial  appendage thrombus was detected.  5. Right atrial size was severely dilated.  6. The mitral valve is abnormal. Moderate to severe mitral valve  regurgitation.  7. Tricuspid valve regurgitation is moderate to severe.  8. The aortic valve has been repaired/replaced. Aortic valve  regurgitation is not  visualized. No aortic stenosis is present.  9. There is Moderate (Grade III) plaque involving the descending aorta  Rt and Lt cardiac cath 09/10/19  RHC 11/25:  RA 2, RV 30/2, PCWP 10, PA 30/14/20,  PA Sat - 55.3% Arterial Sat- 95.7%, PCW Sat 96.3 CO/CI - 2.43/1.31 (TD), 3.87/2.09 (Fick)  LHC 11/25: YB:FXOV diffuse disease   ANV:BTYOMAYO moderate stenosis with circumferential calcification (as seen on IVUS), mid and distal mild-moderate diffuse  KHT:XHFSFSEL mild diffuse disease, distal focal 90% stenosis OM: proximal focal 75% stenosis  TRV:UYEB diffuse disease   LVEDP:  Grafts: SVG-? : ostial occlusion LIMA nonselectively injected showing no anastomosis  IVUS of LM-mid LAD performed for further characterization with above results  Simultaneous LV and Aortic tracings were obtained with a 83F Destination sheath terminating in the ascending aorta and a pigtail catheter in the LV.  Mean Transvalvular Gradient: Aortic Valve Area: 0.85cm2  Conclusions:  Severe CAD as above Occluded SVG  Bioprosthetic aortic valve dysfunction c/f pseudo stenosis vs severe low flow low gradient stenosis  Patient Profile     74 y.o. male with a hx of chronic a fib (eliquis), CAD with CABG, AVR for AS,and LAA clipping 2015,MVR,TIA, recent sepsis (04/2020- PNA) and mod to severe MR and severe TR with no vegetation on TEE, and thrombocytopenia who is being seen today for the evaluation ofCHFat the request of Dr. Blake Divine.  Assessment & Plan    1. Acute on chronic CHF with pleural effusions, pulmonary HTN: -Plan was for IV Lasix 40mg  BID and follow response -Weight, 163lb with an admission weight at 165lb>>>142lb today??? -I&O, net negative 6.4L -Creatinine, 1.06 yesterday>>>pending AM labs today  -Currently on IV Lasix 40mg  BID>>>would plan on continuing given improved renal function>>>consider transition to PO dosing    2. Moderate to severe MR with prior repair in  2015, also with severe TR: -Not felt to be a candidate for mitraclip, also not a candidate for repeat sternotomy as this would be high risk in the setting of co morbid condition -Continue medical management>>palliatiave care following   3. Permanent atrial fibrillation: -AF with SVR>>rates in the 50's -Anticoagulated with Eliquis 5mg  BID with stable labs -No reports of acute bleeding    4. CAD with prior CABG: -R/LHC 09/10/2019 with severe CAD, occluded SVG>>>TCTSconsulted with no plans for repeat CABG  -Denies anginal symptoms -Continue carvedilol, no ASA in the setting of Eliquis   5. Hx of AVR: -Prior replacement with LA appendage clipping   6. Thrombocytopenia: -Chronic issue  Signed, 2016 NP-[facility] Pager: [phone] 08/12/2020, 7:23 AM     For questions or updates, please contact   Please consult [email or web address] for contact info under Cardiology/STEMI.  Personally seen and examined. Agree with above.   Feeling better.  Thin, frail, cachectic.  Why do we give him 1 more day of IV Lasix and then tomorrow give him Lasix 20 mg p.o. twice daily with close follow-up.  He has had multiple readmissions.  Georgie Chard, MD

## 2020-08-12 NOTE — Progress Notes (Signed)
PROGRESS NOTE    Carlos PenmanDanny Welch  RUE:[id number]:[id number] DOB: [birth year] DOA: 08/08/2020 PCP: Esperanza RichtersSaguier, Edward, PA-C    Chief Complaint  Patient presents with  . Chest Pain    Brief Narrative:  74 year old gentleman prior history of diastolic heart failure, aortic valve stenosis s/p valve replacement, atrial fibrillation on Eliquis, coronary artery disease s/p CABG, hypertension, thrombocytopenia, severe mitral valve regurgitation, history of TIA, BPH presented to the ED for worsening shortness of breath, worsening pedal edema, orthopnea, dyspnea on exertion, weight gain, elevated BNP.  He was admitted for acute on chronic diastolic heart failure exacerbation.  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 Cardiology consulted, suggested not a candidate for any surgical repair of the MV/ SURGICAL mitral clip. Recommend medical management.  Started on IV Lasix and remains on 40 mg IV twice daily. Continue with Coreg and losartan and hdyralazine.  Cardiology recommends continuing IV diuresis today and hopefully transitioning to oral Lasix tomorrow and discharge home.  History of atrial fibrillation, rate controlled on Eliquis for anticoagulation and on Coreg for rate control.  History of coronary artery disease s/p CABG Aortic valve stenosis s/p AV replacement. Severe mitral valve regurgitation- nt a candidate for mitral clip and sternotomy is highly risky, hence medical management.  Patient  currently denies any chest pain, continue with Eliquis, Coreg and losartan.   Pancytopenia Unclear etiology, monitor counts.  No signs of bleeding.  Essential hypertension Blood pressure controlled. Continue coreg 6/25 mg BID, losartan 100 mg daily and added hydralazine 25 mg TID.  Marland Kitchen. Hypokalemia: Resolved.  AKI: Resolved.  DVT prophylaxis: Eliquis  code Status: Full code Family Communication: None at bedside.  Called and updated daughter over the phone. Disposition:   Status is: inpatient  The patient will require care spanning > 2 midnights and should be moved to inpatient because: IV treatments appropriate due to intensity of illness or inability to take PO  Dispo: The patient is from: Home              Anticipated d/c is to: Home              Anticipated d/c date is: 1 day              Patient currently is not medically stable to d/c.       Consultants:   Cardiology    Procedures: echocardiogram.  Antimicrobials: none.   Subjective: Seen and examined.  He just woke up.  Denied any shortness of breath.  He was cranky a little bit since he just woke up.  Objective: Vitals:   08/12/20 0500 08/12/20 0752 08/12/20 0817 08/12/20 1207  BP:  (!) 132/98  131/88  Pulse: (!) 54 60 60 62  Resp:  16 16 13   Temp:  97.6 F (36.4 C)  97.6 F (36.4 C)  TempSrc:  Oral  Oral  SpO2: 99% 100% 100% 98%  Weight: 64.6 kg     Height:        Intake/Output Summary (Last 24 hours) at  08/12/2020 1334 Last data filed at 08/12/2020 1209 Gross per 24 hour  Intake --  Output 2200 ml  Net -2200 ml   Filed Weights   08/10/20 0700 08/11/20 0635 08/12/20 0500  Weight: 74.4 kg 74.2 kg 64.6 kg    Examination:  General exam: Appears calm and comfortable  Respiratory system: Clear to auscultation. Respiratory effort normal. Cardiovascular system: S1 & S2 heard, RRR. No JVD, murmurs, rubs, gallops or clicks. No pedal edema. Gastrointestinal system: Abdomen is nondistended, soft  and nontender. No organomegaly or masses felt. Normal bowel sounds heard. Central nervous system: Alert and oriented. No focal neurological deficits. Extremities: Symmetric 5 x 5 power. Skin: No rashes, lesions or ulcers.    Data Reviewed: I have personally reviewed following labs and imaging studies  CBC: Recent Labs  Lab 08/07/20 0239 08/08/20 2027 08/09/20 0701  WBC 3.7* 4.0 3.3*  NEUTROABS 2.4  --   --   HGB 12.3* 12.4* 12.1*  HCT 40.7 42.4 40.4  MCV 80.3 82.0 79.4*  PLT 142* 127* 129*    Basic Metabolic Panel: Recent Labs  Lab 08/07/20 0239 08/08/20 2027 08/09/20 0701 08/10/20 1422 08/11/20 0115  NA 138 138 140 140 138  K 3.6 3.8 3.1* 4.2 4.0  CL 102 105 104 102 104  CO2 26 22 26 30 25   GLUCOSE 142* 96 88 127* 92  BUN 27* 24* 23 16 15   CREATININE 1.61* 1.51* 1.33* 1.21 1.06  CALCIUM 8.9 8.7* 8.6* 9.0 8.9  MG  --   --  1.8  --   --     GFR: Estimated Creatinine Clearance: 56.7 mL/min (by C-G formula based on SCr of 1.06 mg/dL).  Liver Function Tests: Recent Labs  Lab 08/07/20 0239 08/08/20 2120  AST 21 25  ALT 12 12  ALKPHOS 97 96  BILITOT 1.7* 2.0*  PROT 6.9 6.6  ALBUMIN 3.4* 3.1*    CBG: Recent Labs  Lab 08/09/20 2119  GLUCAP 102*     Recent Results (from the past 240 hour(s))  Respiratory Panel by RT PCR (Flu A&B, Covid) - Nasopharyngeal Swab     Status: None   Collection Time: 08/07/20  2:47 AM   Specimen: Nasopharyngeal Swab  Result Value Ref Range Status   SARS Coronavirus 2 by RT PCR NEGATIVE NEGATIVE Final    Comment: (NOTE) SARS-CoV-2 target nucleic acids are NOT DETECTED.  The SARS-CoV-2 RNA is generally detectable in upper respiratoy specimens during the acute phase of infection. The lowest concentration of SARS-CoV-2 viral copies this assay can detect is 131 copies/mL. A negative result does not preclude SARS-Cov-2 infection and should not be used as the sole basis for treatment or other patient management decisions. A  negative result may occur with  improper specimen collection/handling, submission of specimen other than nasopharyngeal swab, presence of viral mutation(s) within the areas targeted by this assay, and inadequate number of viral copies (<131 copies/mL). A negative result must be combined with clinical observations, patient history, and epidemiological information. The expected result is Negative.  Fact Sheet for Patients:  2120  Fact Sheet for Healthcare Providers:  08/09/20  This test is no t yet approved or cleared by the https://www.moore.com/ FDA and  has been authorized for detection and/or diagnosis of SARS-CoV-2 by FDA under an Emergency Use Authorization (EUA). This EUA will remain  in effect (meaning this test can be used) for the duration of the COVID-19 declaration under Section 564(b)(1) of the Act, 21 U.S.C. section 360bbb-3(b)(1), unless  the authorization is terminated or revoked sooner.     Influenza A by PCR NEGATIVE NEGATIVE Final   Influenza B by PCR NEGATIVE NEGATIVE Final    Comment: (NOTE) The Xpert Xpress SARS-CoV-2/FLU/RSV assay is intended as an aid in  the diagnosis of influenza from Nasopharyngeal swab specimens and  should not be used as a sole basis for treatment. Nasal washings and  aspirates are unacceptable for Xpert Xpress SARS-CoV-2/FLU/RSV  testing.  Fact Sheet for Patients: https://www.moore.com/  Fact Sheet for Healthcare Providers: https://www.young.biz/  This test is not yet approved or cleared by the [place] FDA and  has been authorized for detection and/or diagnosis of SARS-CoV-2 by  FDA under an Emergency Use Authorization (EUA). This EUA will remain  in effect (meaning this test can be used) for the duration of the  Covid-19 declaration under Section 564(b)(1) of the Act, 21  U.S.C. section 360bbb-3(b)(1), unless the authorization  is  terminated or revoked. Performed at [facility], [address] [place], [place] [address]   Respiratory Panel by RT PCR (Flu A&B, Covid) - Nasopharyngeal Swab     Status: None   Collection Time: 08/08/20 11:26 PM   Specimen: Nasopharyngeal Swab  Result Value Ref Range Status   SARS Coronavirus 2 by RT PCR NEGATIVE NEGATIVE Final    Comment: (NOTE) SARS-CoV-2 target nucleic acids are NOT DETECTED.  The SARS-CoV-2 RNA is generally detectable in upper respiratoy specimens during the acute phase of infection. The lowest concentration of SARS-CoV-2 viral copies this assay can detect is 131 copies/mL. A negative result does not preclude SARS-Cov-2 infection and should not be used as the sole basis for treatment or other patient management decisions. A negative result may occur with  improper specimen collection/handling, submission of specimen other than nasopharyngeal swab, presence of viral mutation(s) within the areas targeted by this assay, and inadequate number of viral copies (<131 copies/mL). A negative result must be combined with clinical observations, patient history, and epidemiological information. The expected result is Negative.  Fact Sheet for Patients:  https://www.moore.com/  Fact Sheet for Healthcare Providers:  https://www.young.biz/  This test is no t yet approved or cleared by the [place] FDA and  has been authorized for detection and/or diagnosis of SARS-CoV-2 by FDA under an Emergency Use Authorization (EUA). This EUA will remain  in effect (meaning this test can be used) for the duration of the COVID-19 declaration under Section 564(b)(1) of the Act, 21 U.S.C. section 360bbb-3(b)(1), unless the authorization is terminated or revoked sooner.     Influenza A by PCR NEGATIVE NEGATIVE Final   Influenza B by PCR NEGATIVE NEGATIVE Final    Comment: (NOTE) The Xpert Xpress SARS-CoV-2/FLU/RSV assay is  intended as an aid in  the diagnosis of influenza from Nasopharyngeal swab specimens and  should not be used as a sole basis for treatment. Nasal washings and  aspirates are unacceptable for Xpert Xpress SARS-CoV-2/FLU/RSV  testing.  Fact Sheet for Patients: https://www.moore.com/  Fact Sheet for Healthcare Providers: https://www.young.biz/  This test is not yet approved or cleared by the [place] FDA and  has been authorized for detection and/or diagnosis of SARS-CoV-2 by  FDA under an Emergency Use Authorization (EUA). This EUA will remain  in effect (meaning this test can be used) for the duration of the  Covid-19 declaration under Section 564(b)(1) of the Act, 21  U.S.C. section 360bbb-3(b)(1), unless the authorization is  terminated or revoked. Performed at [facility],  [address] [place], [place] [address]     Radiology Studies: No results found.  Scheduled Meds: . apixaban  5 mg Oral BID  . carvedilol  6.25 mg Oral BID WC  . furosemide  40 mg Intravenous BID  . hydrALAZINE  25 mg Oral Q8H  . ipratropium  0.5 mg Nebulization 2 times per day  . losartan  100 mg Oral Daily  . pantoprazole  40 mg Oral Q0600  . potassium chloride  40 mEq Oral Daily  . sodium chloride flush  3 mL Intravenous Q12H   Continuous Infusions:   LOS: 3 days   Hughie Closs, MD [facility]  To contact the attending provider between 7A-7P or the covering provider during after hours 7P-7A, please log into the web site [email or web address] and access using universal [place] password for that web site. If you do not have the password, please call the hospital operator.  08/12/2020, 1:34 PM

## 2020-08-13 DIAGNOSIS — I5033 Acute on chronic diastolic (congestive) heart failure: Secondary | ICD-10-CM | POA: Diagnosis not present

## 2020-08-13 LAB — BASIC METABOLIC PANEL
Anion gap: 10 (ref 5–15)
BUN: 17 mg/dL (ref 8–23)
CO2: 27 mmol/L (ref 22–32)
Calcium: 9.3 mg/dL (ref 8.9–10.3)
Chloride: 100 mmol/L (ref 98–111)
Creatinine, Ser: 1.38 mg/dL — ABNORMAL HIGH (ref 0.61–1.24)
GFR, Estimated: 54 mL/min — ABNORMAL LOW (ref >60)
Glucose, Bld: 89 mg/dL (ref 70–99)
Potassium: 3.9 mmol/L (ref 3.5–5.1)
Sodium: 137 mmol/L (ref 135–145)

## 2020-08-13 MED ORDER — HYDRALAZINE HCL 25 MG PO TABS: 25.0000 mg | ORAL_TABLET | Freq: Three times a day (TID) | ORAL | 0 refills | Status: DC

## 2020-08-13 MED ORDER — FUROSEMIDE 20 MG PO TABS
20.0000 mg | ORAL_TABLET | Freq: Two times a day (BID) | ORAL | Status: DC
  Administered 2020-08-13: 20 mg via ORAL
  Filled 2020-08-13: qty 1

## 2020-08-13 MED ORDER — FUROSEMIDE 20 MG PO TABS: 20.0000 mg | ORAL_TABLET | Freq: Two times a day (BID) | ORAL | 0 refills | Status: DC

## 2020-08-13 NOTE — Progress Notes (Signed)
Discharged patient to home with daughter via wheelchair.  Discharge instructions and medications reviewed. Questions answered. PIV removed.

## 2020-08-13 NOTE — Discharge Summary (Signed)
Physician Discharge Summary  Jesse Nosbisch WUJ:[id number] DOB: 1946/09/10 DOA: 08/08/2020  PCP: [name], PA-C  Admit date: 08/08/2020 Discharge date: 08/13/2020  Admitted From: Home Disposition: Home  Recommendations for Outpatient Follow-up:  1. Follow up with PCP in 1-2 weeks 2. Follow-up with cardiology in 2 to 4 weeks 3. Please obtain BMP/CBC in one week 4. Please follow up with your PCP on the following pending results: Unresulted Labs (From admission, onward)         None       Home Health: Yes Equipment/Devices: Bedside commode  Discharge Condition: Stable CODE STATUS: Full code Diet recommendation: Low-sodium  Subjective: Seen and examined.  No complaints.  Denied any shortness of breath.  Brief/Interim Summary: 74 year old gentleman prior history of diastolic heart failure, aortic valve stenosis s/p valve replacement, atrial fibrillation on Eliquis, coronary artery disease s/p CABG, hypertension, thrombocytopenia, severe mitral valve regurgitation, history of TIA, BPH presented to the ED for worsening shortness of breath, worsening pedal edema, orthopnea, dyspnea on exertion, weight gain, elevated BNP.  He was admitted for acute hypoxic respiratory failure secondary to acute on chronic diastolic heart failure exacerbation. Echocardiogram showed presesrved left ventricular EF with moderate to severe MR, . Cardiology consulted, suggested not a candidate for any surgical repair of the MV/ SURGICAL mitral clip. Recommend medical management.  Started on IV Lasix and remains on 40 mg IV twice daily.  Patient had significant diuresis and subsequently was weaned down from oxygen to room air yesterday and has been saturating over 90% at room air with activity and at rest.  Patient is completely asymptomatic at this point in time.  He was switched from IV Lasix to oral Lasix 20 mg p.o. twice daily.  Cardiology recommended discontinuing aspirin since he is on Eliquis.  They have  cleared him for discharge.  He was seen by PT OT and they recommended home health which has been arranged for him.  Patient is being discharged in stable condition.  I discussed discharge plan with her daughter over the phone yesterday and she was in agreement.  Of note, when chart reviewed in detail, patient's renal function Has been waxing and waning and currently it is not too far away from his baseline.  We recommend repeating BMP at his PCPs office next visit/next week.   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   carvedilol 6.25 MG tablet Commonly known as: COREG Take 1 tablet (6.25 mg total) by mouth 2 (two) times daily with a meal.   furosemide 20 MG tablet Commonly known as: LASIX Take 1 tablet (20 mg total) by mouth 2 (two) times daily. What changed:  medication strength  when to take this  reasons to take this   Ginkgo Biloba 40 MG Tabs Take 1 tablet by mouth daily.   HEMP OIL-VANILLYL BUTYL ETHER EX Apply 1 application topically daily.   hydrALAZINE 25 MG tablet Commonly known as: APRESOLINE Take 1 tablet (25 mg total) by mouth every 8 (eight) hours.   HYDROcodone-acetaminophen 5-325 MG  tablet Commonly known as: Norco Take 0.5 tablets by mouth every 4 (four) hours as needed (for pain).   ipratropium 0.02 % nebulizer solution Commonly known as: ATROVENT Take 2.5 mLs (0.5 mg total) by nebulization in the morning, at noon, and at bedtime.   losartan 100 MG tablet Commonly known as: COZAAR Take 1 tablet (100 mg total) by mouth daily.   multivitamin with minerals Tabs tablet Take 1 tablet by mouth daily.   ondansetron 4 MG disintegrating tablet Commonly known as: Zofran ODT Take 1 tablet (4 mg total) by mouth every 8 (eight) hours as needed for nausea or vomiting.   OXYGEN Inhale 2-3 sprays into the lungs every 4 (four) hours as needed (for shortness of breath).   potassium chloride SA 20 MEQ tablet Commonly known as: KLOR-CON Take 20 mEq by mouth daily with breakfast.   TURMERIC PO Take 1 tablet by mouth daily.            Durable Medical Equipment  (From admission, onward)         Start     Ordered   08/12/20 0949  For home use only DME Bedside commode  Once       Question:  Patient needs a bedside commode to treat with the following condition  Answer:  Balance problem   08/12/20 0949          Follow-up Information    [facility] Follow up.        [place], [facility] Follow up.   Why: for home health services. They will call in 1-2 days to set up your first home appointment Contact information: [address] [place] [address] [phone]        [place], [name], PA-C Follow up in 1 week(s).   Specialties: Internal Medicine, Family Medicine Contact information: [address] [place] [place] [address] [phone]        Thurmon Fair, MD .   Specialty: Cardiology Contact information: [address] [place] [address] [phone]              Allergies  Allergen Reactions  . Chocolate Rash  . Chocolate Flavor Rash  . Peanut Oil Rash     Consultations: Cardiology   Procedures/Studies: DG Chest 2 View  Result Date: 08/08/2020 CLINICAL DATA:  Chest pain. EXAM: CHEST - 2 VIEW COMPARISON:  August 07, 2020 FINDINGS: The lungs are hyperinflated. Multiple sternal wires are seen. Stable diffusely increased interstitial lung markings are seen. Persistent mild to moderate severity right basilar atelectasis and/or infiltrate is noted. There is a stable small to moderate size right pleural effusion. No pneumothorax is identified. The cardiac silhouette is markedly enlarged and unchanged in size. The visualized skeletal structures are unremarkable. IMPRESSION: 1. Stable mild to moderate severity right basilar atelectasis and/or infiltrate. 2. Stable small to moderate size right pleural effusion. Electronically Signed   By: Aram Candela M.D.   On: 08/08/2020 22:14   DG Chest 2 View  Result Date: 08/07/2020 CLINICAL DATA:  Shortness of breath. Bilateral lower extremity swelling. Weakness.  Previous smoker. EXAM: CHEST - 2 VIEW COMPARISON:  08/01/2020 FINDINGS: Postoperative changes in the mediastinum. Cardiac enlargement. No vascular congestion. Right pleural effusion with basilar infiltration or atelectasis, unchanged. Degenerative changes in the spine. IMPRESSION: Persistent right pleural effusion with basilar infiltration or atelectasis. Electronically Signed   By: Burman Nieves M.D.   On: 08/07/2020 03:19   DG Chest 2 View  Result Date: 07/20/2020 CLINICAL DATA:  Initial evaluation for acute chest pain. EXAM: CHEST - 2 VIEW COMPARISON:  Prior radiograph from 07/07/2020. FINDINGS: Median sternotomy wires with underlying valvular replacement, surgical clips, and left atrial appendage clip. Cardiomegaly, stable. Mediastinal silhouette within normal limits. Aortic atherosclerosis. Lungs normally inflated. Blunting of the right costophrenic angle suggestive of a small effusion. Diffuse vascular and interstitial prominence most  consistent with pulmonary interstitial edema. Superimposed more hazy opacity at the right lung base favored to reflect edema and/or effusion. No definite focal infiltrates. No pneumothorax. No acute osseous finding. IMPRESSION: 1. Cardiomegaly with diffuse pulmonary interstitial edema and small right pleural effusion. 2. Superimposed more hazy opacity at the right lung base, favored to reflect congestion and/or effusion, although infiltrate could be considered in the correct clinical setting. Electronically Signed   By: Rise Mu M.D.   On: 07/20/2020 05:51   CT Chest Wo Contrast  Result Date: 08/03/2020 CLINICAL DATA:  Lung nodule. EXAM: CT CHEST WITHOUT CONTRAST TECHNIQUE: Multidetector CT imaging of the chest was performed following the standard protocol without IV contrast. COMPARISON:  May 12, 2020. FINDINGS: Cardiovascular: Status post aortic valve repair. Atherosclerosis of thoracic aorta is noted. 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 No pericardial effusion is noted. Coronary artery calcifications are noted. Mediastinum/Nodes: No enlarged mediastinal or axillary lymph nodes. Thyroid gland, trachea, and esophagus demonstrate no significant findings. Lungs/Pleura: No pneumothorax is noted. Significantly enlarged loculated right pleural effusion is noted compared to prior exam. Mild right basilar subsegmental atelectasis or scarring is noted. Rounded opacity seen in right lung base on prior exam is not well visualized currently, potentially being obscured by larger pleural effusion. Stable small loculated left pleural effusion is noted medially in the posterior portion of the left lung base. Upper Abdomen: No acute abnormality. Musculoskeletal: No chest wall mass or suspicious bone lesions identified. IMPRESSION: 1. Significantly enlarged loculated right pleural effusion is noted compared to prior exam. Mild right basilar subsegmental atelectasis or scarring is noted. Rounded  opacity seen in right lung base on prior exam is not well visualized currently, potentially being obscured by larger pleural effusion. Stable small loculated left pleural effusion is noted medially in the posterior portion of the left lung base. 2. Status post aortic valve repair. 3. Coronary artery calcifications are noted. 4. 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 Recommend semi-annual imaging followup by CTA or MRA and referral to cardiothoracic surgery if not already obtained. This recommendation follows 2010 ACCF/AHA/AATS/ACR/ASA/SCA/SCAI/SIR/STS/SVM Guidelines for the Diagnosis and Management of Patients With Thoracic Aortic Disease. Circulation. 2010; 121: Z610-R604. Aortic aneurysm NOS (ICD10-I71.9). Aortic Atherosclerosis (ICD10-I70.0). Electronically Signed   By: Lupita Raider M.D.   On: 08/03/2020 15:46   DG Chest Port 1 View  Result Date: 08/01/2020 CLINICAL DATA:  Shortness of breath EXAM: PORTABLE CHEST 1 VIEW COMPARISON:  07/20/2020 FINDINGS: Cardiac shadow is enlarged. Postsurgical changes are again seen. Aortic calcifications are noted. Right-sided pleural effusion is seen chronic in appearance stable from the prior study. Persistent hazy opacity in the right lung is seen. No new focal infiltrate is noted. IMPRESSION:  Stable appearance of the chest when compared with the previous exam. Electronically Signed   By: Alcide CleverMark  Lukens M.D.   On: 08/01/2020 19:31   DG Abdomen Acute W/Chest  Result Date: 08/01/2020 CLINICAL DATA:  Shortness of breath EXAM: DG ABDOMEN ACUTE W/ 1V CHEST COMPARISON:  04/14/2014 FINDINGS: Right pleural effusion with basilar atelectasis. Remote median sternotomy. Trace left pleural effusion. No free intraperitoneal air.  Normal bowel gas pattern. IMPRESSION: 1. Bilateral pleural effusions, right greater than left. Right basilar opacity, atelectasis or infection. 2. Normal bowel gas pattern. Electronically Signed   By: Deatra RobinsonKevin  Herman M.D.   On: 08/01/2020  05:31   ECHOCARDIOGRAM COMPLETE  Result Date: 08/09/2020    ECHOCARDIOGRAM REPORT   [facility] Date of Exam: 08/09/2020 Medical Rec #:  [id number]  Height:       74.0 in Accession #:    [id number] Weight:       166.4 lb Date of Birth:  1946-09-17 BSA:          2.010 m Patient Age:    73 years   BP:           171/115 mmHg Patient Gender: M          HR:           62 bpm. Exam Location:  Inpatient Procedure: 2D Echo, Cardiac Doppler, Color Doppler and Intracardiac            Opacification Agent Indications:    Dyspnea  History:        Patient has prior history of Echocardiogram examinations, most                 recent 05/13/2020. CAD, Prior CABG, Mitral Valve Disease and                 Aortic Valve Disease, Arrythmias:Atrial Fibrillation,                 Signs/Symptoms:Shortness of Breath; Risk Factors:Hypertension                 and Former Smoker. S/p AVR. TIA.                 Aortic Valve: valve is present in the aortic position.  Sonographer:    Ross LudwigArthur Guy RDCS (AE) Referring Phys: [id number] Cecille PoLEXANDER B MELVIN IMPRESSIONS  1. Left ventricular ejection fraction, by estimation, is 50 to 55%. The left ventricle has low normal function. The left ventricle has no regional wall motion abnormalities. There is moderate left ventricular hypertrophy. Left ventricular diastolic parameters are indeterminate.  2. Right ventricular systolic function is moderately reduced. The right ventricular size is mildly enlarged. There is severely elevated pulmonary artery systolic pressure. The estimated right ventricular systolic pressure is 71.2 mmHg.  3. Left atrial size was severely dilated.  4. Right atrial size was severely dilated.  5. The mitral valve is grossly normal. Moderate to severe mitral valve regurgitation. The mean mitral valve gradient is 5.0 mmHg with average heart rate of 61 bpm.  6. Tricuspid valve regurgitation is severe.  7. Bioprosethetic valve with mild thickening. Acceleration time 85 msec.  DVI 0.33; no perivalvular AI. The aortic valve is calcified. Aortic valve regurgitation is not visualized. There is a valve present in the aortic position.  8. Aortic dilatation noted. There is borderline dilatation of the aortic root, measuring 38 mm.  9. The inferior vena cava is dilated in size with <50% respiratory variability, suggesting right  atrial pressure of 15 mmHg. Comparison(s): A prior study was performed on 05/12/20. LV is less vigorous in this study. Stable Aortic Root. Significant tricuspid and mitral regurgitation with RV dysfuction; worse from prior. FINDINGS  Left Ventricle: Left ventricular ejection fraction, by estimation, is 50 to 55%. The left ventricle has low normal function. The left ventricle has no regional wall motion abnormalities. Definity contrast agent was given IV to delineate the left ventricular endocardial borders. The left ventricular internal cavity size was normal in size. There is moderate left ventricular hypertrophy. Left ventricular diastolic parameters are indeterminate. Right Ventricle: The right ventricular size is mildly enlarged. No increase in right ventricular wall thickness. Right ventricular systolic function is moderately reduced. There is severely elevated pulmonary artery systolic pressure. The tricuspid regurgitant velocity is 3.75 m/s, and with an assumed right atrial pressure of 15 mmHg, the estimated right ventricular systolic pressure is 71.2 mmHg. Left Atrium: Left atrial size was severely dilated. Right Atrium: Right atrial size was severely dilated. Pericardium: There is no evidence of pericardial effusion. Mitral Valve: The mitral valve is grossly normal. Moderate to severe mitral valve regurgitation. The mean mitral valve gradient is 5.0 mmHg with average heart rate of 61 bpm. Tricuspid Valve: The tricuspid valve is grossly normal. Tricuspid valve regurgitation is severe. Aortic Valve: Bioprosethetic valve with mild thickening. Acceleration time 85  msec. DVI 0.33; no perivalvular AI. The aortic valve is calcified. Aortic valve regurgitation is not visualized. Aortic valve mean gradient measures 6.0 mmHg. Aortic valve peak  gradient measures 11.9 mmHg. Aortic valve area, by VTI measures 1.79 cm. There is a valve present in the aortic position. Pulmonic Valve: The pulmonic valve was not well visualized. Pulmonic valve regurgitation is trivial. Aorta: Aortic dilatation noted. There is borderline dilatation of the aortic root, measuring 38 mm. Venous: The inferior vena cava is dilated in size with less than 50% respiratory variability, suggesting right atrial pressure of 15 mmHg. IAS/Shunts: The atrial septum is grossly normal.  LEFT VENTRICLE PLAX 2D LVIDd:         5.00 cm LVIDs:         4.10 cm LV PW:         1.30 cm LV IVS:        2.30 cm LVOT diam:     2.40 cm LV SV:         53 LV SV Index:   26 LVOT Area:     4.52 cm  RIGHT VENTRICLE            IVC RV S prime:     3.94 cm/s  IVC diam: 3.00 cm LEFT ATRIUM              Index       RIGHT ATRIUM           Index LA diam:        5.50 cm  2.74 cm/m  RA Area:     31.10 cm LA Vol (A2C):   147.0 ml 73.12 ml/m RA Volume:   115.00 ml 57.21 ml/m LA Vol (A4C):   128.0 ml 63.67 ml/m LA Biplane Vol: 137.0 ml 68.15 ml/m  AORTIC VALVE AV Area (Vmax):    1.75 cm AV Area (Vmean):   1.70 cm AV Area (VTI):     1.79 cm AV Vmax:           172.80 cm/s AV Vmean:          112.800 cm/s AV VTI:  0.297 m AV Peak Grad:      11.9 mmHg AV Mean Grad:      6.0 mmHg LVOT Vmax:         66.76 cm/s LVOT Vmean:        42.340 cm/s LVOT VTI:          0.117 m LVOT/AV VTI ratio: 0.40  AORTA Ao Root diam: 3.80 cm Ao Asc diam:  3.80 cm MITRAL VALVE           TRICUSPID VALVE MV Mean grad: 5.0 mmHg TR Peak grad:   56.2 mmHg                        TR Vmax:        375.00 cm/s                         SHUNTS                        Systemic VTI:  0.12 m                        Systemic Diam: 2.40 cm Riley Lam MD Electronically  signed by Riley Lam MD Signature Date/Time: 08/09/2020/2:41:49 PM    Final       Discharge Exam: Vitals:   08/13/20 0500 08/13/20 0757  BP:  119/80  Pulse: 65 65  Resp: 16 19  Temp:  97.6 F (36.4 C)  SpO2: 98% 94%   Vitals:   08/12/20 2312 08/13/20 0357 08/13/20 0500 08/13/20 0757  BP: (!) 123/97 (!) 143/97  119/80  Pulse: 63 66 65 65  Resp: Temp: 97.6 F (36.4 C) 97.6 F (36.4 C)  97.6 F (36.4 C)  TempSrc: Oral Oral  Oral  SpO2: 94% 96% 98% 94%  Weight:   64.7 kg   Height:        General: Pt is alert, awake, not in acute distress Cardiovascular: RRR, S1/S2 +, no rubs, no gallops Respiratory: CTA bilaterally, no wheezing, no rhonchi Abdominal: Soft, NT, ND, bowel sounds + Extremities: no edema, no cyanosis    The results of significant diagnostics from this hospitalization (including imaging, microbiology, ancillary and laboratory) are listed below for reference.     Microbiology: Recent Results (from the past 240 hour(s))  Respiratory Panel by RT PCR (Flu A&B, Covid) - Nasopharyngeal Swab     Status: None   Collection Time: 08/07/20  2:47 AM   Specimen: Nasopharyngeal Swab  Result Value Ref Range Status   SARS Coronavirus 2 by RT PCR NEGATIVE NEGATIVE Final    Comment: (NOTE) SARS-CoV-2 target nucleic acids are NOT DETECTED.  The SARS-CoV-2 RNA is generally detectable in upper respiratoy specimens during the acute phase of infection. The lowest concentration of SARS-CoV-2 viral copies this assay can detect is 131 copies/mL. A negative result does not preclude SARS-Cov-2 infection and should not be used as the sole basis for treatment or other patient management decisions. A negative result may occur with  improper specimen collection/handling, submission of specimen other than nasopharyngeal swab, presence of viral mutation(s) within the areas targeted by this assay, and inadequate number of viral copies (<131 copies/mL). A  negative result must be combined with clinical observations, patient history, and epidemiological information. The expected result is Negative.  Fact Sheet for Patients:  https://www.moore.com/  Fact Sheet for Healthcare  Providers:  https://www.young.biz/  This test is no t yet approved or cleared by the [place] and  has been authorized for detection and/or diagnosis of SARS-CoV-2 by FDA under an Emergency Use Authorization (EUA). This EUA will remain  in effect (meaning this test can be used) for the duration of the COVID-19 declaration under Section 564(b)(1) of the Act, 21 U.S.C. section 360bbb-3(b)(1), unless the authorization is terminated or revoked sooner.     Influenza A by PCR NEGATIVE NEGATIVE Final   Influenza B by PCR NEGATIVE NEGATIVE Final    Comment: (NOTE) The Xpert Xpress SARS-CoV-2/FLU/RSV assay is intended as an aid in  the diagnosis of influenza from Nasopharyngeal swab specimens and  should not be used as a sole basis for treatment. Nasal washings and  aspirates are unacceptable for Xpert Xpress SARS-CoV-2/FLU/RSV  testing.  Fact Sheet for Patients: https://www.moore.com/  Fact Sheet for Healthcare Providers: https://www.young.biz/  This test is not yet approved or cleared by the [place] FDA and  has been authorized for detection and/or diagnosis of SARS-CoV-2 by  FDA under an Emergency Use Authorization (EUA). This EUA will remain  in effect (meaning this test can be used) for the duration of the  Covid-19 declaration under Section 564(b)(1) of the Act, 21  U.S.C. section 360bbb-3(b)(1), unless the authorization is  terminated or revoked. Performed at [facility], [address] [place], [place] [address]   Respiratory Panel by RT PCR (Flu A&B, Covid) - Nasopharyngeal Swab     Status: None   Collection Time: 08/08/20 11:26 PM   Specimen:  Nasopharyngeal Swab  Result Value Ref Range Status   SARS Coronavirus 2 by RT PCR NEGATIVE NEGATIVE Final    Comment: (NOTE) SARS-CoV-2 target nucleic acids are NOT DETECTED.  The SARS-CoV-2 RNA is generally detectable in upper respiratoy specimens during the acute phase of infection. The lowest concentration of SARS-CoV-2 viral copies this assay can detect is 131 copies/mL. A negative result does not preclude SARS-Cov-2 infection and should not be used as the sole basis for treatment or other patient management decisions. A negative result may occur with  improper specimen collection/handling, submission of specimen other than nasopharyngeal swab, presence of viral mutation(s) within the areas targeted by this assay, and inadequate number of viral copies (<131 copies/mL). A negative result must be combined with clinical observations, patient history, and epidemiological information. The expected result is Negative.  Fact Sheet for Patients:  https://www.moore.com/  Fact Sheet for Healthcare Providers:  https://www.young.biz/  This test is no t yet approved or cleared by the [place] FDA and  has been authorized for detection and/or diagnosis of SARS-CoV-2 by FDA under an Emergency Use Authorization (EUA). This EUA will remain  in effect (meaning this test can be used) for the duration of the COVID-19 declaration under Section 564(b)(1) of the Act, 21 U.S.C. section 360bbb-3(b)(1), unless the authorization is terminated or revoked sooner.     Influenza A by PCR NEGATIVE NEGATIVE Final   Influenza B by PCR NEGATIVE NEGATIVE Final    Comment: (NOTE) The Xpert Xpress SARS-CoV-2/FLU/RSV assay is intended as an aid in  the diagnosis of influenza from Nasopharyngeal swab specimens and  should not be used as a sole basis for treatment. Nasal washings and  aspirates are unacceptable for Xpert Xpress SARS-CoV-2/FLU/RSV  testing.  Fact Sheet  for Patients: https://www.moore.com/  Fact Sheet for Healthcare Providers: https://www.young.biz/  This test is not yet approved or cleared by the [place]  [place] FDA and  has been authorized for detection and/or diagnosis of SARS-CoV-2 by  FDA under an Emergency Use Authorization (EUA). This EUA will remain  in effect (meaning this test can be used) for the duration of the  Covid-19 declaration under Section 564(b)(1) of the Act, 21  U.S.C. section 360bbb-3(b)(1), unless the authorization is  terminated or revoked. Performed at [facility], [address] [place], [place] [address]      Labs: BNP (last 3 results) Recent Labs    08/01/20 0456 08/01/20 1939 08/08/20 2120  BNP 1,131.4* 1,200.1* 1,288.7*   Basic Metabolic Panel: Recent Labs  Lab 08/08/20 2027 08/09/20 0701 08/10/20 1422 08/11/20 0115 08/13/20 0049  NA 138 140 140 138 137  K 3.8 3.1* 4.2 4.0 3.9  CL 105 104 102 104 100  CO2 22 26 30 25 27   GLUCOSE 96 88 127* 92 89  BUN 24* 23 16 15 17   CREATININE 1.51* 1.33* 1.21 1.06 1.38*  CALCIUM 8.7* 8.6* 9.0 8.9 9.3  MG  --  1.8  --   --   --    Liver Function Tests: Recent Labs  Lab 08/07/20 0239 08/08/20 2120  AST 21 25  ALT 12 12  ALKPHOS 97 96  BILITOT 1.7* 2.0*  PROT 6.9 6.6  ALBUMIN 3.4* 3.1*   Recent Labs  Lab 08/07/20 0239 08/08/20 2039  LIPASE 34 37   No results for input(s): AMMONIA in the last 168 hours. CBC: Recent Labs  Lab 08/07/20 0239 08/08/20 2027 08/09/20 0701  WBC 3.7* 4.0 3.3*  NEUTROABS 2.4  --   --   HGB 12.3* 12.4* 12.1*  HCT 40.7 42.4 40.4  MCV 80.3 82.0 79.4*  PLT 142* 127* 129*   Cardiac Enzymes: No results for input(s): CKTOTAL, CKMB, CKMBINDEX, TROPONINI in the last 168 hours. BNP: Invalid input(s): POCBNP CBG: Recent Labs  Lab 08/09/20 2119  GLUCAP 102*   D-Dimer No results for input(s): DDIMER in the last 72 hours. Hgb A1c No results for input(s): HGBA1C  in the last 72 hours. Lipid Profile No results for input(s): CHOL, HDL, LDLCALC, TRIG, CHOLHDL, LDLDIRECT in the last 72 hours. Thyroid function studies No results for input(s): TSH, T4TOTAL, T3FREE, THYROIDAB in the last 72 hours.  Invalid input(s): FREET3 Anemia work up No results for input(s): VITAMINB12, FOLATE, FERRITIN, TIBC, IRON, RETICCTPCT in the last 72 hours. Urinalysis    Component Value Date/Time   COLORURINE YELLOW 08/07/2020 0324   APPEARANCEUR CLEAR 08/07/2020 0324   LABSPEC >1.030 (H) 08/07/2020 0324   PHURINE 5.5 08/07/2020 0324   GLUCOSEU NEGATIVE 08/07/2020 0324   HGBUR NEGATIVE 08/07/2020 0324   BILIRUBINUR SMALL (A) 08/07/2020 0324   KETONESUR NEGATIVE 08/07/2020 0324   PROTEINUR 100 (A) 08/07/2020 0324   UROBILINOGEN 0.2 04/14/2014 1108   NITRITE NEGATIVE 08/07/2020 0324   LEUKOCYTESUR NEGATIVE 08/07/2020 0324   Sepsis Labs Invalid input(s): PROCALCITONIN,  WBC,  LACTICIDVEN Microbiology Recent Results (from the past 240 hour(s))  Respiratory Panel by RT PCR (Flu A&B, Covid) - Nasopharyngeal Swab     Status: None   Collection Time: 08/07/20  2:47 AM   Specimen: Nasopharyngeal Swab  Result Value Ref Range Status   SARS Coronavirus 2 by RT PCR NEGATIVE NEGATIVE Final    Comment: (NOTE) SARS-CoV-2 target nucleic acids are NOT DETECTED.  The SARS-CoV-2 RNA is generally detectable in upper respiratoy specimens during the acute phase of infection. The lowest concentration of SARS-CoV-2 viral copies this assay can detect  is 131 copies/mL. A negative result does not preclude SARS-Cov-2 infection and should not be used as the sole basis for treatment or other patient management decisions. A negative result may occur with  improper specimen collection/handling, submission of specimen other than nasopharyngeal swab, presence of viral mutation(s) within the areas targeted by this assay, and inadequate number of viral copies (<131 copies/mL). A negative result  must be combined with clinical observations, patient history, and epidemiological information. The expected result is Negative.  Fact Sheet for Patients:  https://www.moore.com/  Fact Sheet for Healthcare Providers:  https://www.young.biz/  This test is no t yet approved or cleared by the [place] FDA and  has been authorized for detection and/or diagnosis of SARS-CoV-2 by FDA under an Emergency Use Authorization (EUA). This EUA will remain  in effect (meaning this test can be used) for the duration of the COVID-19 declaration under Section 564(b)(1) of the Act, 21 U.S.C. section 360bbb-3(b)(1), unless the authorization is terminated or revoked sooner.     Influenza A by PCR NEGATIVE NEGATIVE Final   Influenza B by PCR NEGATIVE NEGATIVE Final    Comment: (NOTE) The Xpert Xpress SARS-CoV-2/FLU/RSV assay is intended as an aid in  the diagnosis of influenza from Nasopharyngeal swab specimens and  should not be used as a sole basis for treatment. Nasal washings and  aspirates are unacceptable for Xpert Xpress SARS-CoV-2/FLU/RSV  testing.  Fact Sheet for Patients: https://www.moore.com/  Fact Sheet for Healthcare Providers: https://www.young.biz/  This test is not yet approved or cleared by the [place] FDA and  has been authorized for detection and/or diagnosis of SARS-CoV-2 by  FDA under an Emergency Use Authorization (EUA). This EUA will remain  in effect (meaning this test can be used) for the duration of the  Covid-19 declaration under Section 564(b)(1) of the Act, 21  U.S.C. section 360bbb-3(b)(1), unless the authorization is  terminated or revoked. Performed at [facility], [address] [place], [place] [address]   Respiratory Panel by RT PCR (Flu A&B, Covid) - Nasopharyngeal Swab     Status: None   Collection Time: 08/08/20 11:26 PM   Specimen: Nasopharyngeal Swab   Result Value Ref Range Status   SARS Coronavirus 2 by RT PCR NEGATIVE NEGATIVE Final    Comment: (NOTE) SARS-CoV-2 target nucleic acids are NOT DETECTED.  The SARS-CoV-2 RNA is generally detectable in upper respiratoy specimens during the acute phase of infection. The lowest concentration of SARS-CoV-2 viral copies this assay can detect is 131 copies/mL. A negative result does not preclude SARS-Cov-2 infection and should not be used as the sole basis for treatment or other patient management decisions. A negative result may occur with  improper specimen collection/handling, submission of specimen other than nasopharyngeal swab, presence of viral mutation(s) within the areas targeted by this assay, and inadequate number of viral copies (<131 copies/mL). A negative result must be combined with clinical observations, patient history, and epidemiological information. The expected result is Negative.  Fact Sheet for Patients:  https://www.moore.com/  Fact Sheet for Healthcare Providers:  https://www.young.biz/  This test is no t yet approved or cleared by the [place] FDA and  has been authorized for detection and/or diagnosis of SARS-CoV-2 by FDA under an Emergency Use Authorization (EUA). This EUA will remain  in effect (meaning this test can be used) for the duration of the COVID-19 declaration under Section 564(b)(1) of the Act, 21 U.S.C. section 360bbb-3(b)(1), unless the authorization is terminated or revoked sooner.  Influenza A by PCR NEGATIVE NEGATIVE Final   Influenza B by PCR NEGATIVE NEGATIVE Final    Comment: (NOTE) The Xpert Xpress SARS-CoV-2/FLU/RSV assay is intended as an aid in  the diagnosis of influenza from Nasopharyngeal swab specimens and  should not be used as a sole basis for treatment. Nasal washings and  aspirates are unacceptable for Xpert Xpress SARS-CoV-2/FLU/RSV  testing.  Fact Sheet for  Patients: https://www.moore.com/  Fact Sheet for Healthcare Providers: https://www.young.biz/  This test is not yet approved or cleared by the [place] FDA and  has been authorized for detection and/or diagnosis of SARS-CoV-2 by  FDA under an Emergency Use Authorization (EUA). This EUA will remain  in effect (meaning this test can be used) for the duration of the  Covid-19 declaration under Section 564(b)(1) of the Act, 21  U.S.C. section 360bbb-3(b)(1), unless the authorization is  terminated or revoked. Performed at [facility], [address], [place], [place] [address]      Time coordinating discharge: Over 30 minutes  SIGNED:   Hughie Closs, MD  [facility] 08/13/2020, 11:41 AM  If 7PM-7AM, please contact night-coverage [email or web address]

## 2020-08-13 NOTE — Progress Notes (Signed)
Occupational Therapy Treatment Patient Details Name: Carlos Welch MRN: [id number] DOB: Aug 14, 1946 Today's Date: 08/13/2020    History of present illness 74 y.o. male with medical history significant of heart failure with preserved ejection fraction, aortic valve stenosis status post valve replacement, BPH, A. fib, CAD status post CABG, hypertension, thrombocytopenia, TIA, and mitral valve regurgitation who presents with several days of worsening shortness of breath. Presents to ED 10/24 with orthopnea, dyspnea on exertion, worsening pedal edema, pulse in 50s and elevated BNP. Admitted for treatment of acute on chronic diastolic heart failure    OT comments  Educated pt in energy conservation and provided written handout. Pt reports having a 3 in 1 at home over his toilet. Instructed pt to use as a shower seat at well for fall prevention and energy conservation. Pt receptive to all information.   Follow Up Recommendations  Home health OT    Equipment Recommendations  None recommended by OT    Recommendations for Other Services      Precautions / Restrictions Precautions Precautions: Fall       Mobility Bed Mobility Overal bed mobility: Modified Independent             General bed mobility comments: HOB up, no assist  Transfers Overall transfer level: Needs assistance   Transfers: Sit to/from Stand Sit to Stand: Supervision         General transfer comment: pt up in room without AD, reaches for furniture to steady    Balance Overall balance assessment: Needs assistance   Sitting balance-Leahy Scale: Good       Standing balance-Leahy Scale: Fair                             ADL either performed or assessed with clinical judgement   ADL Overall ADL's : Modified independent                                       General ADL Comments: educated pt in use of his 3 in1 as a shower seat and in energy conservation strategies.     Vision        Perception     Praxis      Cognition Arousal/Alertness: Awake/alert Behavior During Therapy: WFL for tasks assessed/performed Overall Cognitive Status: Within Functional Limits for tasks assessed                                 General Comments: pt attentive to OT's education in energy conservation strategies.        Exercises     Shoulder Instructions       General Comments      Pertinent Vitals/ Pain       Pain Assessment: No/denies pain  Home Living                                          Prior Functioning/Environment              Frequency  Min 2X/week        Progress Toward Goals  OT Goals(current goals can now be found in the care plan section)  Progress towards OT goals: Progressing toward goals  Acute Rehab OT Goals Patient Stated Goal: walk without SoB OT Goal Formulation: With patient Time For Goal Achievement: 08/25/20 Potential to Achieve Goals: Good  Plan Discharge plan remains appropriate    Co-evaluation                 AM-PAC OT "6 Clicks" Daily Activity     Outcome Measure   Help from another person eating meals?: None Help from another person taking care of personal grooming?: A Little Help from another person toileting, which includes using toliet, bedpan, or urinal?: A Little Help from another person bathing (including washing, rinsing, drying)?: A Little Help from another person to put on and taking off regular upper body clothing?: None Help from another person to put on and taking off regular lower body clothing?: A Little 6 Click Score: 20    End of Session Equipment Utilized During Treatment: Gait belt  OT Visit Diagnosis: Unsteadiness on feet (R26.81);Other abnormalities of gait and mobility (R26.89);Muscle weakness (generalized) (M62.81);Other (comment)   Activity Tolerance Patient tolerated treatment well   Patient Left in bed;with call bell/phone within reach    Nurse Communication          Time: 1110-1130 OT Time Calculation (min): 20 min  Charges: OT General Charges $OT Visit: 1 Visit OT Treatments $Self Care/Home Management : 8-22 mins  Martie Round, OTR/L Acute Rehabilitation Services Pager: [phone] Office: [phone]   Evern Bio 08/13/2020, 11:56 AM

## 2020-08-13 NOTE — Progress Notes (Signed)
Patient remained off oxygen remainder of shift and most of day.  Explained that he did not need as saturations were good. Patient remains limited to bed with laptop per his request.  Continue to encourage patient to get out of bed and ambulate. Pt resting with call bell within reach.  Will continue to monitor.

## 2020-08-13 NOTE — Discharge Instructions (Signed)

## 2020-08-13 NOTE — Progress Notes (Addendum)
Progress Note  Patient Name: Carlos Welch Date of Encounter: 08/13/2020  Primary Cardiologist: Thurmon Fair, MD  Subjective   Feeling well today with no specific complaints.   Inpatient Medications    Scheduled Meds: . apixaban  5 mg Oral BID  . carvedilol  6.25 mg Oral BID WC  . furosemide  40 mg Intravenous BID  . hydrALAZINE  25 mg Oral Q8H  . losartan  100 mg Oral Daily  . pantoprazole  40 mg Oral Q0600  . potassium chloride  40 mEq Oral Daily  . sodium chloride flush  3 mL Intravenous Q12H   Continuous Infusions:  PRN Meds: acetaminophen **OR** acetaminophen, alum & mag hydroxide-simeth, HYDROcodone-acetaminophen, ipratropium   Vital Signs    Vitals:   08/12/20 2130 08/12/20 2312 08/13/20 0357 08/13/20 0500  BP: 120/78 (!) 123/97 (!) 143/97   Pulse:  63 66 65  Resp:  13 17 16   Temp:  97.6 F (36.4 C) 97.6 F (36.4 C)   TempSrc:  Oral Oral   SpO2:  94% 96% 98%  Weight:    64.7 kg  Height:        Intake/Output Summary (Last 24 hours) at 08/13/2020 0723 Last data filed at 08/13/2020 0400 Gross per 24 hour  Intake 920 ml  Output 3650 ml  Net -2730 ml   Filed Weights   08/11/20 0635 08/12/20 0500 08/13/20 0500  Weight: 74.2 kg 64.6 kg 64.7 kg    Physical Exam   General: Elderly, frail, NAD Neck: Negative for carotid bruits. No JVD Lungs:Clear to ausculation bilaterally. No wheezes, rales, or rhonchi. Breathing is unlabored. Cardiovascular: Irregularly irregular. + murmur Abdomen: Soft, non-tender, non-distended. No obvious abdominal masses. Extremities: No edema. Radial pulses 2+ bilaterally Neuro: Alert and oriented. No focal deficits. No facial asymmetry. MAE spontaneously. Psych: Responds to questions appropriately with normal affect.    Labs    Chemistry Recent Labs  Lab 08/07/20 0239 08/08/20 2027 08/08/20 2120 08/09/20 0701 08/10/20 1422 08/11/20 0115 08/13/20 0049  NA 138   < >  --    < > 140 138 137  K 3.6   < >  --    <  > 4.2 4.0 3.9  CL 102   < >  --    < > 102 104 100  CO2 26   < >  --    < > 30 25 27   GLUCOSE 142*   < >  --    < > 127* 92 89  BUN 27*   < >  --    < > 16 15 17   CREATININE 1.61*   < >  --    < > 1.21 1.06 1.38*  CALCIUM 8.9   < >  --    < > 9.0 8.9 9.3  PROT 6.9  --  6.6  --   --   --   --   ALBUMIN 3.4*  --  3.1*  --   --   --   --   AST 21  --  25  --   --   --   --   ALT 12  --  12  --   --   --   --   ALKPHOS 97  --  96  --   --   --   --   BILITOT 1.7*  --  2.0*  --   --   --   --   08/15/20  45*   < >  --    < > >60 >60 54*  ANIONGAP 10   < >  --    < > 8 9 10    < > = values in this interval not displayed.     Hematology Recent Labs  Lab 08/07/20 0239 08/08/20 2027 08/09/20 0701  WBC 3.7* 4.0 3.3*  RBC 5.07 5.17 5.09  HGB 12.3* 12.4* 12.1*  HCT 40.7 42.4 40.4  MCV 80.3 82.0 79.4*  MCH 24.3* 24.0* 23.8*  MCHC 30.2 29.2* 30.0  RDW 20.9* 20.8* 20.5*  PLT 142* 127* 129*    Cardiac EnzymesNo results for input(s): TROPONINI in the last 168 hours. No results for input(s): TROPIPOC in the last 168 hours.   BNP Recent Labs  Lab 08/08/20 2120  BNP 1,288.7*     DDimer No results for input(s): DDIMER in the last 168 hours.   Radiology    No results found.  Telemetry    08/13/20 AF with rates in the 50-60 range- Personally Reviewed  ECG    No new tracing as of 08/13/20- Personally Reviewed  Cardiac Studies   TTE 08/09/20 IMPRESSIONS   1. Left ventricular ejection fraction, by estimation, is 50 to 55%. The  left ventricle has low normal function. The left ventricle has no regional  wall motion abnormalities. There is moderate left ventricular hypertrophy.  Left ventricular diastolic  parameters are indeterminate.  2. Right ventricular systolic function is moderately reduced. The right  ventricular size is mildly enlarged. There is severely elevated pulmonary  artery systolic pressure. The estimated right ventricular systolic  pressure is 71.2 mmHg.    3. Left atrial size was severely dilated.  4. Right atrial size was severely dilated.  5. The mitral valve is grossly normal. Moderate to severe mitral valve  regurgitation. The mean mitral valve gradient is 5.0 mmHg with average  heart rate of 61 bpm.  6. Tricuspid valve regurgitation is severe.  7. Bioprosethetic valve with mild thickening. Acceleration time 85 msec.  DVI 0.33; no perivalvular AI. The aortic valve is calcified. Aortic valve  regurgitation is not visualized. There is a valve present in the aortic  position.  8. Aortic dilatation noted. There is borderline dilatation of the aortic  root, measuring 38 mm.  9. The inferior vena cava is dilated in size with <50% respiratory  variability, suggesting right atrial pressure of 15 mmHg.   Comparison(s): A prior study was performed on 05/12/20. LV is less vigorous  in this study. Stable Aortic Root. Significant tricuspid and mitral  regurgitation with RV dysfuction; worse from prior.    TEE 05/13/20 1. Normal LV function; moderate LVH; severe biatrial enlargement; mild  RVE; s/p AVR with no AS or AI; thickened MV with moderate to severe MR;  moderate to severe TR; small oscillating densities in LV cavity of  uncertain etiology; possible rupture  chordae; would be unusual location for vegetations.  2. Left ventricular ejection fraction, by estimation, is 55 to 60%. The  left ventricle has normal function. The left ventricle has no regional  wall motion abnormalities. There is moderate left ventricular hypertrophy.  3. Right ventricular systolic function is normal. The right ventricular  size is mildly enlarged.  4. Left atrial size was severely dilated. No left atrial/left atrial  appendage thrombus was detected.  5. Right atrial size was severely dilated.  6. The mitral valve is abnormal. Moderate to severe mitral valve  regurgitation.  7. Tricuspid valve regurgitation is moderate  to severe.  8. The  aortic valve has been repaired/replaced. Aortic valve  regurgitation is not visualized. No aortic stenosis is present.  9. There is Moderate (Grade III) plaque involving the descending aorta  Rt and Lt cardiac cath 09/10/19  RHC 11/25: RA 2, RV 30/2, PCWP 10, PA 30/14/20,  PA Sat - 55.3% Arterial Sat- 95.7%, PCW Sat 96.3 CO/CI - 2.43/1.31 (TD), 3.87/2.09 (Fick)  LHC 11/25: ZO:XWRU diffuse disease   EAV:WUJWJXBJ moderate stenosis with circumferential calcification (as seen on IVUS), mid and distal mild-moderate diffuse  YNW:GNFAOZHY mild diffuse disease, distal focal 90% stenosis OM: proximal focal 75% stenosis  QMV:HQIO diffuse disease   LVEDP:  Grafts: SVG-? : ostial occlusion LIMA nonselectively injected showing no anastomosis  IVUS of LM-mid LAD performed for further characterization with above results  Simultaneous LV and Aortic tracings were obtained with a 56F Destination sheath terminating in the ascending aorta and a pigtail catheter in the LV.  Mean Transvalvular Gradient: Aortic Valve Area: 0.85cm2  Conclusions:  Severe CAD as above Occluded SVG  Bioprosthetic aortic valve dysfunction c/f pseudo stenosis vs severe low flow low gradient stenosis   Patient Profile     74 y.o. male with a hx of chronic a fib (eliquis), CAD with CABG, AVR for AS,and LAA clipping 2015,MVR,TIA, recent sepsis (04/2020- PNA) and mod to severe MR and severe TRwithno vegetation on TEE,and thrombocytopeniawho is being seen today for the evaluation ofCHFat the request of Dr. Blake Divine.  Assessment & Plan    1. Acute on chronicCHF with pleural effusions, pulmonary HTN: -NGEXBM,841LK with an admission weight at 165lb>>>142lb today??? -I&O,net negative 9.1L -Creatinine, 1.06 yesterday>>>the a creatinine at 1.38 today -Previously on IV Lasix 40mg  BID>>>will transition to PO Lasix 20mg  BID today and follow given rising renal function and euvolemic on exam    2. Moderate to severe MR with prior repair in 2015, also with severe TR: -Notfelt to bea candidate for mitraclip, also not a candidate for repeat sternotomy as this would be high risk in the setting of co morbid condition -Continue medical management>>palliatiave care following  3. Permanent atrial fibrillation: -AF with SVR>>rates in the 50's -Anticoagulated with Eliquis5mg  BIDwith stable labs -No reports of acute bleeding  4. CAD with prior CABG: -R/LHC 11/25/2020with severe CAD, occluded SVG>>>TCTSconsulted with no plans for repeat CABG -Denies anginal symptoms -Continue carvedilol, no ASA in the setting of Eliquis  5. Hx of AVR: -Prior replacement with LA appendage clipping  6.Thrombocytopenia: -Chronic issue   Signed, 2016 NP-C [facility] Pager: [phone] 08/13/2020, 7:23 AM     For questions or updates, please contact   Please consult [email or web address] for contact info under Cardiology/STEMI.  Personally seen and examined. Agree with above.   Changes today are Lasix to 20 mg p.o. twice daily.  Creatinine slightly increased from yesterday.  Excellent diuresis.  Continue with anticoagulation for atrial fibrillation.  Well rate controlled.  Has aortic valve replacement.  At this point, sounds reasonable for discharge.  He has been at high risk for readmissions.  [phone], MD

## 2020-08-13 NOTE — Progress Notes (Signed)
PT Cancellation Note  Patient Details Name: Carlos Welch MRN: [id number] DOB: 07-Mar-1946   Cancelled Treatment:    Reason Eval/Treat Not Completed: (P) Patient declined, no reason specified (Pt refused despite encouragement and education. He reports," I'm fine.  I can get up.  I want to eat my food and lay here.")   Jenan Ellegood Artis Delay 08/13/2020, 12:27 PM  Bonney Leitz , PTA Acute Rehabilitation Services Pager [phone] Office [phone]

## 2020-08-16 ENCOUNTER — Telehealth: Payer: Self-pay | Admitting: Medical

## 2020-08-16 ENCOUNTER — Telehealth: Payer: Self-pay

## 2020-08-16 NOTE — Telephone Encounter (Signed)
Ok to give VO? 

## 2020-08-16 NOTE — Telephone Encounter (Signed)
Frankie, physical therapist, saw pt over the weekend and needs verbal orders to see patient at home. Frequency three week one, two week three, one week five.  CB [phone], leave msg if no answer.

## 2020-08-16 NOTE — Telephone Encounter (Signed)
Ok. Will you get him in for follow up. Need 40 minute follow up appointment various ED visits recently.

## 2020-08-16 NOTE — Telephone Encounter (Signed)
This patient has a lot of ED visit and missed a lot of appointment with me. Can you call and touch base with daughter. He would need 40 minute follow up.  I put in referral to palliative care. Have they contacted pt?

## 2020-08-16 NOTE — Telephone Encounter (Signed)
First attempt TCM call. Left message to call back.

## 2020-08-17 ENCOUNTER — Telehealth: Payer: Self-pay | Admitting: Internal Medicine

## 2020-08-17 ENCOUNTER — Telehealth: Payer: Self-pay

## 2020-08-17 NOTE — Telephone Encounter (Signed)
VO given.

## 2020-08-17 NOTE — Telephone Encounter (Signed)
Transition Care Management Unsuccessful Follow-up Telephone Call  Date of discharge and from where:  08/13/2020-[facility]  Attempts:  3rd Attempt  Reason for unsuccessful TCM follow-up call:  Left voice message

## 2020-08-17 NOTE — Telephone Encounter (Signed)
Called patient's daughter, Herbert Seta, to offer to schedule a Palliative Consult (post hospital discharge), no answer - left message with reason for call along with name and contact number.

## 2020-08-17 NOTE — Telephone Encounter (Signed)
Called pt and lvm to return call.  

## 2020-08-18 ENCOUNTER — Telehealth: Payer: Self-pay | Admitting: Internal Medicine

## 2020-08-18 ENCOUNTER — Telehealth: Payer: Self-pay | Admitting: Medical

## 2020-08-18 NOTE — Telephone Encounter (Signed)
Called daughter, Herbert Seta, to schedule the Palliative Consult, no answer - left message requesting a return call to let me know if they wish to pursue Palliative services or not.  Let daughter know that MD office was asking if we had been able to schedule a visit with patient yet or not.  Left my name and call back number.

## 2020-08-18 NOTE — Telephone Encounter (Signed)
Caller name: M[facility] Loan ([facility]) Call back number:  Reporting patient's weight 08/17/20 149 lb 08/18/20 152.6 lb  Any medication adjustment please call pt

## 2020-08-18 NOTE — Telephone Encounter (Signed)
Called pt and lvm to return call.  

## 2020-08-19 NOTE — Telephone Encounter (Signed)
He needs to have follow up in office visit. I have been asking for this and they have not followed up.   Various ED visits.  Best to make adjustment based on in office visit rather than simply based on weight.  He needs 40 minute appointment.

## 2020-08-22 ENCOUNTER — Encounter: Payer: Self-pay | Admitting: Medical

## 2020-08-23 NOTE — Telephone Encounter (Signed)
Spoke with son via [place]

## 2020-08-24 ENCOUNTER — Encounter: Payer: Self-pay | Admitting: Medical

## 2020-08-24 ENCOUNTER — Other Ambulatory Visit: Payer: Self-pay

## 2020-08-24 ENCOUNTER — Telehealth: Payer: Self-pay | Admitting: Medical

## 2020-08-24 ENCOUNTER — Ambulatory Visit (INDEPENDENT_AMBULATORY_CARE_PROVIDER_SITE_OTHER): Payer: Medicare Other | Admitting: Medical

## 2020-08-24 ENCOUNTER — Ambulatory Visit (HOSPITAL_BASED_OUTPATIENT_CLINIC_OR_DEPARTMENT_OTHER)
Admission: RE | Admit: 2020-08-24 | Discharge: 2020-08-24 | Disposition: A | Discharge status: Home / Self Care | Payer: Medicare Other | Source: Ambulatory Visit | Attending: Medical | Admitting: Medical

## 2020-08-24 VITALS — BP 139/80 | HR 56 | Resp 18 | Ht 74.0 in | Wt 156.0 lb

## 2020-08-24 DIAGNOSIS — I509 Heart failure, unspecified: Secondary | ICD-10-CM | POA: Diagnosis not present

## 2020-08-24 DIAGNOSIS — I10 Essential (primary) hypertension: Secondary | ICD-10-CM | POA: Diagnosis not present

## 2020-08-24 DIAGNOSIS — Z23 Encounter for immunization: Secondary | ICD-10-CM | POA: Diagnosis not present

## 2020-08-24 DIAGNOSIS — I25810 Atherosclerosis of coronary artery bypass graft(s) without angina pectoris: Secondary | ICD-10-CM | POA: Diagnosis not present

## 2020-08-24 DIAGNOSIS — R918 Other nonspecific abnormal finding of lung field: Secondary | ICD-10-CM

## 2020-08-24 DIAGNOSIS — I482 Chronic atrial fibrillation, unspecified: Secondary | ICD-10-CM

## 2020-08-24 LAB — CBC WITH DIFFERENTIAL/PLATELET
Basophils Absolute: 0.1 10*3/uL (ref 0.0–0.1)
Basophils Relative: 1.5 % (ref 0.0–3.0)
Eosinophils Absolute: 0.1 10*3/uL (ref 0.0–0.7)
Eosinophils Relative: 3.6 % (ref 0.0–5.0)
HCT: 39.7 % (ref 39.0–52.0)
Hemoglobin: 12.5 g/dL — ABNORMAL LOW (ref 13.0–17.0)
Lymphocytes Relative: 18.5 % (ref 12.0–46.0)
Lymphs Abs: 0.7 10*3/uL (ref 0.7–4.0)
MCHC: 31.6 g/dL (ref 30.0–36.0)
MCV: 77.2 fl — ABNORMAL LOW (ref 78.0–100.0)
Monocytes Absolute: 0.4 10*3/uL (ref 0.1–1.0)
Monocytes Relative: 10.3 % (ref 3.0–12.0)
Neutro Abs: 2.6 10*3/uL (ref 1.4–7.7)
Neutrophils Relative %: 66.1 % (ref 43.0–77.0)
Platelets: 174 10*3/uL (ref 150.0–400.0)
RBC: 5.14 Mil/uL (ref 4.22–5.81)
RDW: 22.2 % — ABNORMAL HIGH (ref 11.5–15.5)
WBC: 3.9 10*3/uL — ABNORMAL LOW (ref 4.0–10.5)

## 2020-08-24 LAB — COMPREHENSIVE METABOLIC PANEL
ALT: 15 U/L (ref 0–53)
AST: 31 U/L (ref 0–37)
Albumin: 3.9 g/dL (ref 3.5–5.2)
Alkaline Phosphatase: 123 U/L — ABNORMAL HIGH (ref 39–117)
BUN: 18 mg/dL (ref 6–23)
CO2: 32 mEq/L (ref 19–32)
Calcium: 9.5 mg/dL (ref 8.4–10.5)
Chloride: 100 mEq/L (ref 96–112)
Creatinine, Ser: 0.93 mg/dL (ref 0.40–1.50)
GFR: 81.73 mL/min (ref >60.00)
Glucose, Bld: 74 mg/dL (ref 70–99)
Potassium: 4.8 mEq/L (ref 3.5–5.1)
Sodium: 137 mEq/L (ref 135–145)
Total Bilirubin: 1.1 mg/dL (ref 0.2–1.2)
Total Protein: 7.2 g/dL (ref 6.0–8.3)

## 2020-08-24 LAB — BRAIN NATRIURETIC PEPTIDE: Pro B Natriuretic peptide (BNP): 370 pg/mL — ABNORMAL HIGH (ref 0.0–100.0)

## 2020-08-24 NOTE — Telephone Encounter (Signed)
Caller name: Chaya Jan [facility]) Call back number: [phone]  Verbal Order  Nurse to go out and evaluate/educate patient for CHF and weight management.

## 2020-08-24 NOTE — Telephone Encounter (Signed)
Approve verbal order education on chf/weight gain.

## 2020-08-24 NOTE — Progress Notes (Signed)
Subjective:    Patient ID: Carlos Welch, male    DOB: 10-15-1946, 74 y.o.   MRN: [id number]  HPI  Pt in for follow up.  Pt tells me he is doing better today. He states feeling stronger.  Pt 02 sat is 100%.  Pt states before he went to the hospital wt 165-175 lb. Pt weight 156 lb past 3 days. Pt dC weight on admission was 143 on oct 29,2021.  Pt states his appetite is better. He thinks he is gaining weight.  Recent dc summary info below. Admit date: 08/08/2020 Discharge date: 08/13/2020  Admitted From: Home Disposition: Home  Recommendations for Outpatient Follow-up:  1. Follow up with PCP in 1-2 weeks 2. Follow-up with cardiology in 2 to 4 weeks 3. Please obtain BMP/CBC in one week 4. Please follow up with your PCP on the following pending results:  Brief/Interim Summary: 74 year old gentleman prior history of diastolic heart failure, aortic valve stenosis s/p valve replacement, atrial fibrillation on Eliquis, coronary artery disease s/p CABG, hypertension, thrombocytopenia, severe mitral valve regurgitation, history of TIA, BPH presentedto the ED for worsening shortness of breath, worsening pedal edema, orthopnea, dyspnea on exertion, weight gain, elevated BNP. He was admitted for acute hypoxic respiratory failure secondary to acute on chronic diastolic heart failure exacerbation. Echocardiogram showed presesrved left ventricular EF with moderate to severe MR, . Cardiology consulted, suggested not a candidate for any surgical repair of the MV/ SURGICAL mitral clip. Recommend medical management.Started on IV Lasix and remains on 40 mg IV twice daily.  Patient had significant diuresis and subsequently was weaned down from oxygen to room air yesterday and has been saturating over 90% at room air with activity and at rest.  Patient is completely asymptomatic at this point in time.  He was switched from IV Lasix to oral Lasix 20 mg p.o. twice daily.  Cardiology recommended  discontinuing aspirin since he is on Eliquis.  They have cleared him for discharge.  He was seen by PT OT and they recommended home health which has been arranged for him.  Patient is being discharged in stable condition.  I discussed discharge plan with her daughter over the phone yesterday and she was in agreement.  Of note, when chart reviewed in detail, patient's renal function Has been waxing and waning and currently it is not too far away from his baseline.  We recommend repeating BMP at his PCPs office next visit/next week.   Pt has appointment with cardiologist on Thursday.  Pt has possible lung mass. Pulmonologist stated on visit. Lung nodule: RLL rounded opacity - favor atelectasis from fluid overload. Will repeat CT scan soon (3 months from prior scan) to ensure it is better/unchanged.   Pt clarifies that his request for morphine in past was due to insomnia. Related to chf flare. Pt denies.  During interim since pt has various admissions I did place palliative care referral. Family is cooridinating that appointment.   Review of Systems  Constitutional: Negative for chills, fatigue and fever.  HENT: Negative for congestion and drooling.   Respiratory: Negative for cough, chest tightness, shortness of breath and wheezing.   Cardiovascular: Negative for chest pain and palpitations.  Gastrointestinal: Negative for abdominal pain.  Genitourinary: Negative for dysuria and flank pain.  Musculoskeletal: Negative for back pain.  Skin: Negative for rash.  Neurological: Negative for dizziness and headaches.  Hematological: Negative for adenopathy. Does not bruise/bleed easily.  Psychiatric/Behavioral: Negative for behavioral problems, decreased concentration, dysphoric mood and suicidal ideas.  The patient is not nervous/anxious.     Past Medical History:  Diagnosis Date  . A-fib (HCC)   . Abscess of jaw 07/19/2020  . Acute on chronic heart failure with preserved ejection fraction (HCC)  07/08/2020  . Aortic stenosis    a. s/p pericardial AVR 2015.  Marland Kitchen Aortic valve stenosis 07/19/2020  . Bacteremia 07/19/2020  . Bacterial endocarditis   . BPH (benign prostatic hyperplasia) 07/08/2020  . CAD (coronary artery disease)    a. s/p CABG, AVR, LAA clipping 2015 at [facility].  . Cellulitis of lower extremity 07/08/2020  . Chronic atrial fibrillation (HCC) 07/08/2020  . Chronic combined systolic and diastolic CHF (congestive heart failure) (HCC)   . Cognitive communication deficit 07/19/2020  . Coronary artery disease involving coronary bypass graft of native heart without angina pectoris 07/19/2020  . Debility 07/19/2020  . 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 Elevated troponin 07/08/2020  . Essential hypertension 07/08/2020  . Generalized abdominal pain   . History of noncompliance with medical treatment   . Hyperlipemia 07/08/2020  . 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  . Malnutrition (HCC)   . MRSA bacteremia   . 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 Normocytic anemia 07/08/2020  . Pressure injury of skin 05/12/2020  . 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 Severe malnutrition (HCC) 07/19/2020  . Thrombocytopenia (HCC) 07/08/2020  . TIA (transient ischemic attack)   . Weight loss 07/19/2020     Social History   Socioeconomic History  . Marital status: Divorced    Spouse name: Not on file  . Number of children: Not on file  . Years of education: Not on file  . Highest education level: Not on file  Occupational History  . Not on file  Tobacco Use  . Smoking status: Former Smoker    Packs/day: 3.00    Years: 47.00    Pack years: 141.00    Types: Cigarettes, Cigars    Start date: 1964    Quit date: 07/28/2010    Years since quitting: 10.0  . Smokeless tobacco: Never Used  Substance and Sexual Activity  . Alcohol use: No  . Drug use: No  . Sexual activity: Not on file  Other  Topics Concern  . Not on file  Social History Narrative  . Not on file   Social Determinants of Health   Financial Resource Strain:   . Difficulty of Paying Living Expenses: Not on file  Food Insecurity:   . Worried About Programme researcher, broadcasting/film/video in the Last Year: Not on file  . Ran Out of Food in the Last Year: Not on file  Transportation Needs:   . Lack of Transportation (Medical): Not on file  . Lack of Transportation (Non-Medical): Not on file  Physical Activity:   . Days of Exercise per Week: Not on file  . Minutes of Exercise per Session: Not on file  Stress:   . Feeling of Stress : Not on file  Social Connections:   . Frequency of Communication with Friends and Family: Not on file  . Frequency of Social Gatherings with Friends and Family: Not on file  . Attends Religious Services: Not on file  . Active Member of Clubs or Organizations: Not on file  . Attends Banker Meetings: Not on file  . Marital Status: Not on file  Intimate Partner Violence:   . Fear of Current or Ex-Partner: Not  on file  . Emotionally Abused: Not on file  . Physically Abused: Not on file  . Sexually Abused: Not on file    Past Surgical History:  Procedure Laterality Date  . AORTIC VALVE REPLACEMENT    . CORONARY ARTERY BYPASS GRAFT  2015  . HERNIA REPAIR    . REMOVAL OF IMPLANT  03/14/2020  . TEE WITHOUT CARDIOVERSION N/A 05/13/2020   Procedure: TRANSESOPHAGEAL ECHOCARDIOGRAM (TEE);  Surgeon: Lewayne Bunting, MD;  Location: [facility] ENDOSCOPY;  Service: Cardiovascular;  Laterality: N/A;    Family History  Problem Relation Age of Onset  . CAD Neg Hx     Allergies  Allergen Reactions  . Chocolate Rash  . Chocolate Flavor Rash  . Peanut Oil Rash    Current Outpatient Medications on File Prior to Visit  Medication Sig Dispense Refill  . apixaban (ELIQUIS) 5 MG TABS tablet Take 1 tablet (5 mg total) by mouth in the morning and at bedtime. 60 tablet 3  . carvedilol (COREG) 6.25 MG  tablet Take 1 tablet (6.25 mg total) by mouth 2 (two) times daily with a meal. 60 tablet 3  . furosemide (LASIX) 20 MG tablet Take 1 tablet (20 mg total) by mouth 2 (two) times daily. 60 tablet 0  . Ginkgo Biloba 40 MG TABS Take 1 tablet by mouth daily.    Marland Kitchen HEMP OIL-VANILLYL BUTYL ETHER EX Apply 1 application topically daily.    . hydrALAZINE (APRESOLINE) 25 MG tablet Take 1 tablet (25 mg total) by mouth every 8 (eight) hours. 90 tablet 0  . HYDROcodone-acetaminophen (NORCO) 5-325 MG tablet Take 0.5 tablets by mouth every 4 (four) hours as needed (for pain). 12 tablet 0  . ipratropium (ATROVENT) 0.02 % nebulizer solution Take 2.5 mLs (0.5 mg total) by nebulization in the morning, at noon, and at bedtime. 240 mL 11  . losartan (COZAAR) 100 MG tablet Take 1 tablet (100 mg total) by mouth daily. 90 tablet 3  . Multiple Vitamin (MULTIVITAMIN WITH MINERALS) TABS tablet Take 1 tablet by mouth daily.    . ondansetron (ZOFRAN ODT) 4 MG disintegrating tablet Take 1 tablet (4 mg total) by mouth every 8 (eight) hours as needed for nausea or vomiting. 20 tablet 0  . OXYGEN Inhale 2-3 sprays into the lungs every 4 (four) hours as needed (for shortness of breath).    . potassium chloride SA (KLOR-CON) 20 MEQ tablet Take 20 mEq by mouth daily with breakfast.    . TURMERIC PO Take 1 tablet by mouth daily.     No current facility-administered medications on file prior to visit.    BP 139/80   Pulse (!) 56   Resp 18   Ht 6\' 2"  (1.88 m)   Wt 156 lb (70.8 kg)   SpO2 95%   BMI 20.03 kg/m       Objective:   Physical Exam  General Mental Status- Alert. General Appearance- Not in acute distress.   Skin General: Color- Normal Color. Moisture- Normal Moisture.  Neck Carotid Arteries- Normal color. Moisture- Normal Moisture. No carotid bruits. No JVD.  Chest and Lung Exam Auscultation: Breath Sounds:-Normal.  Cardiovascular Auscultation:Rythm- Regular. Murmurs & Other Heart Sounds:Auscultation of  the heart reveals- No Murmurs.  Abdomen Inspection:-Inspeection Normal. Palpation/Percussion:Note:No mass. Palpation and Percussion of the abdomen reveal- Non Tender, Non Distended + BS, no rebound or guarding.   Neurologic Cranial Nerve exam:- CN III-XII intact(No nystagmus), symmetric smile. Strength:- 5/5 equal and symmetric strength both upper and lower extremities.  Assessment & Plan:  History of CHF with various hospitalizations over the past 2 months.  Reporting doing well since last hospitalization.  You do need to weigh yourself daily and if weight increasing let us know.  Continue Lasix 20 mg twice daily.  Also continue hydralazine, Coreg, and losartan.  Continue with Eliquis as well.  We will get a CBC, CMP, BNP and chest x-ray today.  On review of chart the lung mass on CT may have been atelectasis and fluid overload related.  Pulmonologist plans to repeat CT chest in 3 months.  Keep cardiology appointment on Thursday.  Make sure they are aware that I did lab work today and a chest x-ray.  Also reminder to use the Coreg as that is on the discharge summary medication list.  Follow-up in 3 months or as needed.

## 2020-08-24 NOTE — Patient Instructions (Addendum)
History of CHF with various hospitalizations over the past 2 months.  Reporting doing well since last hospitalization.  You do need to weigh yourself daily and if weight increasing let us know.  Continue Lasix 20 mg twice daily.  Also continue hydralazine, Coreg, and losartan.  Continue with Eliquis as well.  We will get a CBC, CMP, BNP and chest x-ray today.  On review of chart the lung mass on CT may have been atelectasis and fluid overload related.  Pulmonologist plans to repeat CT chest in 3 months.  Keep cardiology appointment on Thursday.  Make sure they are aware that I did lab work today and a chest x-ray.  Also reminder to use the Coreg as that is on the discharge summary medication list.  Follow-up in 3 months or as needed.

## 2020-08-25 NOTE — Telephone Encounter (Signed)
VO given to [employer]

## 2020-08-26 ENCOUNTER — Ambulatory Visit: Payer: Medicare Other | Admitting: Physician Assistant

## 2020-09-01 ENCOUNTER — Encounter (HOSPITAL_BASED_OUTPATIENT_CLINIC_OR_DEPARTMENT_OTHER): Payer: Medicare Other | Admitting: Internal Medicine

## 2020-09-02 ENCOUNTER — Ambulatory Visit (INDEPENDENT_AMBULATORY_CARE_PROVIDER_SITE_OTHER): Payer: Medicare Other | Admitting: Cardiovascular Disease

## 2020-09-02 VITALS — BP 150/92 | HR 65 | Ht 74.0 in | Wt 170.2 lb

## 2020-09-02 DIAGNOSIS — I5032 Chronic diastolic (congestive) heart failure: Secondary | ICD-10-CM | POA: Diagnosis not present

## 2020-09-02 DIAGNOSIS — Z953 Presence of xenogenic heart valve: Secondary | ICD-10-CM

## 2020-09-02 DIAGNOSIS — I25810 Atherosclerosis of coronary artery bypass graft(s) without angina pectoris: Secondary | ICD-10-CM

## 2020-09-02 DIAGNOSIS — I4811 Longstanding persistent atrial fibrillation: Secondary | ICD-10-CM | POA: Diagnosis not present

## 2020-09-02 DIAGNOSIS — I340 Nonrheumatic mitral (valve) insufficiency: Secondary | ICD-10-CM

## 2020-09-02 DIAGNOSIS — I10 Essential (primary) hypertension: Secondary | ICD-10-CM | POA: Diagnosis not present

## 2020-09-02 DIAGNOSIS — Z7901 Long term (current) use of anticoagulants: Secondary | ICD-10-CM

## 2020-09-02 MED ORDER — FUROSEMIDE 40 MG PO TABS: 80.0000 mg | ORAL_TABLET | Freq: Every day | ORAL | 3 refills | Status: DC

## 2020-09-02 MED ORDER — HYDRALAZINE HCL 50 MG PO TABS: 50.0000 mg | ORAL_TABLET | Freq: Three times a day (TID) | ORAL | 3 refills | Status: DC

## 2020-09-02 NOTE — Progress Notes (Signed)
Cardiology Office Note:    Date:  09/03/2020   ID:  Carlos PenmanDanny Chouinard, DOB 06-10-1946, MRN [id number]  PCP:  Marisue BrooklynSaguier, Edward, PA-C  [facility] Cardiologist:  Thurmon FairMihai Heru Montz, MD  [facility] Electrophysiologist:  None   Referring MD: Marisue BrooklynSaguier, Edward, PA-C   Chief Complaint  Patient presents with  . Congestive Heart Failure    History of Present Illness:    Carlos Welch is a 74 y.o. 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  He has had very frequent hospitalizations for heart failure exacerbation.  Most recently he was hospitalized from October 24 through the 29th.  On the day of admission he weighed 74.5 kg (164.5 pounds).  On the day of hospital discharge he weighed 64.7 kg (143 pounds).  He has gained back fluid in our office scale today he weighs 170 pounds, although his home scale only shows 160 pounds.  According to the weight log that his daughter is carefully keeping, he has gained 9 pounds since he arrived home.  Obviously there is a lot of conflict between these numbers.  Needless to say he does have significant lower extremity edema, although he does not have orthopnea or PND.  His blood pressure was severely elevated when he arrived at the hospital (147/119), and is much better today albeit still not in the desirable range at 150/92.  He does not have angina pectoris.  He has not had dizziness or syncope and denies palpitations.  Baseline BNP seems to be around 500, on hospital admission it was around 1200.  proBNP on November 9 (outpatient labs) was 370, on the day when his weight was documented at 156 pounds (71 kg).  He has  gained fluid weight since that last lab was drawn.  His electrocardiogram shows atrial fibrillation with good ventricular rate control.  He is unaware of the arrhythmia.  His medications include carvedilol and the maximum dose losartan as well as a low-dose of hydralazine and a very low-dose of loop diuretic.  Although his daughter has insisted that he needs to increase his dose of diuretic, he is reluctant, since "he is doing fine".  He minimizes the presence of lower extremity edema.  His daughter is trying to make sure that he eats a low-sodium diet, although he occasionally slips up.  He has had a recent right and left heart catheterization as well as a transesophageal echocardiogram.  Unfortunately he has a lot of thickening of the mitral leaflets which appear restricted (postinflammatory?)  And is deemed to be a poor candidate for either surgical repair of the mitral valve or percutaneous mitral clip.  Past Medical History:  Diagnosis Date  . A-fib (HCC)   . Abscess of jaw 07/19/2020  . Acute on chronic heart failure with preserved ejection fraction (HCC) 07/08/2020  . Aortic stenosis    a. s/p pericardial AVR 2015.  Marland Kitchen. Aortic valve stenosis 07/19/2020  . Bacteremia 07/19/2020  . Bacterial endocarditis   . BPH (benign prostatic hyperplasia) 07/08/2020  . CAD (coronary artery disease)    a. [facility]  . Cellulitis of lower extremity 07/08/2020  . Chronic atrial fibrillation (HCC) 07/08/2020  . Chronic combined  systolic and diastolic CHF (congestive heart failure) (HCC)   . Cognitive communication deficit 07/19/2020  . Coronary artery disease involving coronary bypass graft of native heart without angina pectoris 07/19/2020  . Debility 07/19/2020  . 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 Elevated troponin 07/08/2020  . Essential hypertension 07/08/2020  . Generalized abdominal pain   . History of noncompliance with  medical treatment   . Hyperlipemia 07/08/2020  . 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  . Malnutrition (HCC)   . MRSA bacteremia   . 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 Normocytic anemia 07/08/2020  . Pressure injury of skin 05/12/2020  . 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 Severe malnutrition (HCC) 07/19/2020  . Thrombocytopenia (HCC) 07/08/2020  . TIA (transient ischemic attack)   . Weight loss 07/19/2020    Past Surgical History:  Procedure Laterality Date  . AORTIC VALVE REPLACEMENT    . CORONARY ARTERY BYPASS GRAFT  2015  . HERNIA REPAIR    . REMOVAL OF IMPLANT  03/14/2020  . TEE WITHOUT CARDIOVERSION N/A 05/13/2020   Procedure: TRANSESOPHAGEAL ECHOCARDIOGRAM (TEE);  Surgeon: Lewayne Bunting, MD;  Location: [facility] ENDOSCOPY;  Service: Cardiovascular;  Laterality: N/A;    Current Medications: Current Meds  Medication Sig  . apixaban (ELIQUIS) 5 MG TABS tablet Take 1 tablet (5 mg total) by mouth in the morning and at bedtime.  . carvedilol (COREG) 6.25 MG tablet Take 1 tablet (6.25 mg total) by mouth 2 (two) times daily with a meal.  . furosemide (LASIX) 40 MG tablet Take 2 tablets (80 mg total) by mouth daily.  . Ginkgo Biloba 40 MG TABS Take 1 tablet by mouth daily.  Marland Kitchen HEMP OIL-VANILLYL BUTYL ETHER EX Apply 1 application topically daily.  . hydrALAZINE (APRESOLINE) 50 MG tablet Take 1 tablet (50 mg total) by mouth every 8 (eight) hours.  Marland Kitchen HYDROcodone-acetaminophen (NORCO) 5-325 MG tablet Take 0.5 tablets by mouth every 4 (four) hours as needed (for pain).  Marland Kitchen ipratropium (ATROVENT) 0.02 % nebulizer solution Take 2.5 mLs (0.5 mg total) by nebulization in the morning, at noon, and at bedtime.  Marland Kitchen losartan (COZAAR) 100 MG tablet Take 1 tablet (100 mg total) by mouth daily.  . Multiple Vitamin (MULTIVITAMIN WITH MINERALS) TABS tablet Take 1 tablet by mouth daily.  . ondansetron (ZOFRAN ODT) 4 MG disintegrating tablet Take 1 tablet (4  mg total) by mouth every 8 (eight) hours as needed for nausea or vomiting.  . OXYGEN Inhale 2-3 sprays into the lungs every 4 (four) hours as needed (for shortness of breath).  . potassium chloride SA (KLOR-CON) 20 MEQ tablet Take 20 mEq by mouth daily with breakfast.  . TURMERIC PO Take 1 tablet by mouth daily.  . [DISCONTINUED] furosemide (LASIX) 20 MG tablet Take 1 tablet (20 mg total) by mouth 2 (two) times daily.  . [DISCONTINUED] hydrALAZINE (APRESOLINE) 25 MG tablet Take 1 tablet (25 mg total) by mouth every 8 (eight) hours.     Allergies:   Chocolate, Chocolate flavor, and Peanut oil   Social History   Socioeconomic History  . Marital status: Divorced    Spouse name: Not on file  . Number of children: Not on file  . Years of education: Not on file  . Highest education level: Not on file  Occupational History  . Not on file  Tobacco Use  . Smoking status: Former Smoker    Packs/day: 3.00  Years: 47.00    Pack years: 141.00    Types: Cigarettes, Cigars    Start date: 44    Quit date: 07/28/2010    Years since quitting: 10.1  . Smokeless tobacco: Never Used  Substance and Sexual Activity  . Alcohol use: No  . Drug use: No  . Sexual activity: Not on file  Other Topics Concern  . Not on file  Social History Narrative  . Not on file   Social Determinants of Health   Financial Resource Strain:   . Difficulty of Paying Living Expenses: Not on file  Food Insecurity:   . Worried About Programme researcher, broadcasting/film/video in the Last Year: Not on file  . Ran Out of Food in the Last Year: Not on file  Transportation Needs:   . Lack of Transportation (Medical): Not on file  . Lack of Transportation (Non-Medical): Not on file  Physical Activity:   . Days of Exercise per Week: Not on file  . Minutes of Exercise per Session: Not on file  Stress:   . Feeling of Stress : Not on file  Social Connections:   . Frequency of Communication with Friends and Family: Not on file  . Frequency  of Social Gatherings with Friends and Family: Not on file  . Attends Religious Services: Not on file  . Active Member of Clubs or Organizations: Not on file  . Attends Banker Meetings: Not on file  . Marital Status: Not on file     Family History: The patient's family history is negative for CAD.  ROS:   Please see the history of present illness.     All other systems reviewed and are negative.  EKGs/Labs/Other Studies Reviewed:    The following studies were reviewed today: Echocardiogram 05/12/2020  1. Left ventricular ejection fraction, by estimation, is 55 to 60%. The  left ventricle has normal function. The left ventricle has no regional  wall motion abnormalities. There is moderate concentric left ventricular  hypertrophy. Left ventricular  diastolic parameters are indeterminate.  2. Right ventricular systolic function is normal. The right ventricular  size is normal. There is moderately elevated pulmonary artery systolic  pressure.  3. Left atrial size was severely dilated.  4. Right atrial size was severely dilated.  5. The mitral valve is normal in structure. Mild mitral valve  regurgitation. No evidence of mitral stenosis.  6. There is thickening of the septal leaflet. The tricuspid valve is  abnormal.  7. Perivalvular thickening around the bioprosthetic aortic valve. The  valve appears to be stable and there is no rocking motion. In the setting  of bacteremia concern for endocarditis/abscess. TEE recommended.. The  aortic valve has been  repaired/replaced. Aortic valve regurgitation is not visualized. No aortic  stenosis is present. There is a unknown bioprosthetic tilting disk valve  present in the aortic position. Procedure Date: 2015. Echo findings are  consistent with normal structure  and function of the aortic valve prosthesis. Aortic valve area, by VTI  measures 1.39 cm. Aortic valve mean gradient measures 10.0 mmHg. Aortic  valve Vmax  measures 2.16 m/s.  8. Aortic dilatation noted. There is mild dilatation of the ascending  aorta measuring 38 mm.  9. The inferior vena cava is dilated in size with >50% respiratory  variability, suggesting right atrial pressure of 8 mmHg.   TEE 05/13/2020 1. Normal LV function; moderate LVH; severe biatrial enlargement; mild  RVE; s/p AVR with no AS or AI; thickened MV  with moderate to severe MR;  moderate to severe TR; small oscillating densities in LV cavity of  uncertain etiology; possible rupture  chordae; would be unusual location for vegetations.  2. Left ventricular ejection fraction, by estimation, is 55 to 60%. The  left ventricle has normal function. The left ventricle has no regional  wall motion abnormalities. There is moderate left ventricular hypertrophy.  3. Right ventricular systolic function is normal. The right ventricular  size is mildly enlarged.  4. Left atrial size was severely dilated. No left atrial/left atrial  appendage thrombus was detected.  5. Right atrial size was severely dilated.  6. The mitral valve is abnormal. Moderate to severe mitral valve  regurgitation.  7. Tricuspid valve regurgitation is moderate to severe.  8. The aortic valve has been repaired/replaced. Aortic valve  regurgitation is not visualized. No aortic stenosis is present.  9. 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   08/09/2020 TTE: 1. Left ventricular ejection fraction, by estimation, is 50 to 55%. The  left ventricle has low normal function. The left ventricle has no regional  wall motion abnormalities. There is moderate left ventricular hypertrophy.  Left ventricular diastolic  parameters are indeterminate.  2. Right ventricular systolic function is moderately reduced. The right  ventricular size is mildly enlarged. There is severely elevated pulmonary  artery systolic pressure. The estimated right ventricular systolic  pressure is  71.2 mmHg.  3. Left atrial size was severely dilated.  4. Right atrial size was severely dilated.  5. The mitral valve is grossly normal. Moderate to severe mitral valve  regurgitation. The mean mitral valve gradient is 5.0 mmHg with average  heart rate of 61 bpm.  6. Tricuspid valve regurgitation is severe.  7. Bioprosethetic valve with mild thickening. Acceleration time 85 msec.  DVI 0.33; no perivalvular AI. The aortic valve is calcified. Aortic valve  regurgitation is not visualized. There is a valve present in the aortic  position.  8. Aortic dilatation noted. There is borderline dilatation of the aortic  root, measuring 38 mm.  9. The inferior vena cava is dilated in size with <50% respiratory  variability, suggesting right atrial pressure of 15 mmHg.   Comparison(s): A prior study was performed on 05/12/20. LV is less vigorous  in this study. Stable Aortic Root. Significant tricuspid and mitral  regurgitation with RV dysfuction; worse from prior.   Rt and Lt cardiac cath 09/10/19  RHC 11/25: RA 2, RV 30/2, PCWP 10, PA 30/14/20,  PA Sat - 55.3% Arterial Sat- 95.7%, PCW Sat 96.3 CO/CI - 2.43/1.31 (TD), 3.87/2.09 (Fick)  LHC 11/25: JX:BJYN diffuse disease   WGN:FAOZHYQM moderate stenosis with circumferential calcification (as seen on IVUS), mid and distal mild-moderate diffuse  VHQ:IONGEXBM mild diffuse disease, distal focal 90% stenosis OM: proximal focal 75% stenosis  WUX:LKGM diffuse disease   LVEDP:  Grafts: SVG-? : ostial occlusion LIMA nonselectively injected showing no anastomosis  IVUS of LM-mid LAD performed for further characterization with above results  Simultaneous LV and Aortic tracings were obtained with a 50F Destination sheath terminating in the ascending aorta and a pigtail catheter in the LV.  Mean Transvalvular Gradient: Aortic Valve Area: 0.85cm2  Conclusions:  Severe CAD as above Occluded SVG  Bioprosthetic  aortic valve dysfunction c/f pseudo stenosis vs severe low flow low gradient stenosis   EKG:  EKG is  ordered today.  The ekg ordered today demonstrates atrial fibrillation, LVH, inferior St depression and T wave inversion  Recent Labs: 05/13/2020: TSH 1.178 08/08/2020: B Natriuretic Peptide 1,288.7 08/09/2020: Magnesium 1.8 08/24/2020: ALT 15; BUN 18; Creatinine, Ser 0.93; Hemoglobin 12.5; Platelets 174.0; Potassium 4.8; Pro B Natriuretic peptide (BNP) 370.0; Sodium 137  Recent Lipid Panel No results found for: CHOL, TRIG, HDL, CHOLHDL, VLDL, LDLCALC, LDLDIRECT   Risk Assessment/Calculations:     CHA2DS2-VASc Score = 6  This indicates a 9.7% annual risk of stroke. The patient's score is based upon: CHF History: 1 HTN History: 1 Diabetes History: 0 Stroke History: 2 Vascular Disease History: 1 Age Score: 1 Gender Score: 0      Physical Exam:    VS:  BP (!) 150/92   Pulse 65   Ht 6\' 2"  (1.88 m)   Wt 170 lb 3.2 oz (77.2 kg)   SpO2 95%   BMI 21.85 kg/m     Wt Readings from Last 3 Encounters:  09/02/20 170 lb 3.2 oz (77.2 kg)  08/24/20 156 lb (70.8 kg)  08/13/20 142 lb 10.2 oz (64.7 kg)     GEN:  thin and appears chronically ill, well developed in no acute distress HEENT: Normal NECK: 6-7 cm elevation in JVD; No carotid bruits LYMPHATICS: No lymphadenopathy CARDIAC: Irregular rhythm, 3/6 apical holosystolic murmur radiating deep into the axilla no diastolic murmurs, rubs, gallops RESPIRATORY:  Clear to auscultation without rales, wheezing or rhonchi  ABDOMEN: Soft, non-tender, non-distended MUSCULOSKELETAL: Symmetrical 2+ pitting edema of the ankles and pretibial area almost to the knees bilaterally; No deformity  SKIN: Warm and dry NEUROLOGIC:  Alert and oriented x 3 PSYCHIATRIC:  Normal affect   ASSESSMENT:    1. Chronic diastolic heart failure (HCC)   2. Longstanding persistent atrial fibrillation (HCC)   3. Coronary artery disease involving coronary bypass  graft of native heart without angina pectoris   4. Essential hypertension   5. Nonrheumatic mitral valve regurgitation   6. History of aortic valve replacement with bioprosthetic valve   7. Long term (current) use of anticoagulants    PLAN:    In order of problems listed above:  1. CHF: His weight has gone up and he has clear physical findings of hypervolemia, although he denies shortness of breath or orthopnea.  It seems to me that his "dry weight" is around 70-71 kg (155-160 pounds), but that his home scale may not be in line with our office scales (this appears to show a weight that is 10 pounds less than our office scale).  Clearly today at 170 pounds he is hypervolemic and he was hospitalized when he weighed 174 pounds.  He needs to increase the dose of his diuretic and we will need to see him relatively frequently.  He is on maximum dose losartan and also takes an effective dose of carvedilol.  Reviewed the importance of sodium restriction and daily weight monitoring and diuretic dose adjustment with him in detail today.  He is an intelligent gentleman and understands what is going on, but he has a tendency to minimize his problems.  Pointed out that if we wait too long to intervene with higher doses of diuretics, oral diuretics often fail to work and he will require hospitalization for intravenous medications again.  We will increase his diuretic dose today with a plan to reach a weight that is 10 pounds less than today's weight (that would be around 160 pounds on our office scale and 150 pounds on his home scale) 2. AFib: Well rate controlled on the current dose of carvedilol.  Reports compliance  with apixaban.   3. CAD: He has advanced disease in both native vessels and grafts, but does not have angina pectoris and by most recent studies has preserved LVEF.  He does not require revascularization procedures at this time. 4. HTN: Target BP 130/80 or less.  We will increase his  hydralazine. 5. MR: He has hemodynamically significant mitral regurgitation that appears to be related to a combination of primary structural changes of the mitral valve (probably postinflammatory) and secondary to ischemic cardiomyopathy.  Unfortunately he is not a candidate for any type of mechanical intervention.  Focus on medical therapy. 6. AVR: Normally functioning biological prosthesis by recent echo and TEE.  High risk for endocarditis.  Need for prophylaxis with dental procedures or other situations where he could experience severe bacteremia. 7. Anticoagulation: No recent bleeding complications.  Hemoglobin 12.5 a few days ago    Shared Decision Making/Informed Consent        Medication Adjustments/Labs and Tests Ordered: Current medicines are reviewed at length with the patient today.  Concerns regarding medicines are outlined above.  Orders Placed This Encounter  Procedures  . EKG 12-Lead   Meds ordered this encounter  Medications  . hydrALAZINE (APRESOLINE) 50 MG tablet    Sig: Take 1 tablet (50 mg total) by mouth every 8 (eight) hours.    Dispense:  90 tablet    Refill:  3  . furosemide (LASIX) 40 MG tablet    Sig: Take 2 tablets (80 mg total) by mouth daily.    Dispense:  60 tablet    Refill:  3    Patient Instructions  Medication Instructions:  INCREASE the Furosemide to 80 mg once daily (2 of the 40 mg tablets) INCREASE the Hydralazine to 50 mg three times daily  *If you need a refill on your cardiac medications before your next appointment, please call your pharmacy*   Lab Work: None ordered If you have labs (blood work) drawn today and your tests are completely normal, you will receive your results only by: Marland Kitchen MyChart Message (if you have MyChart) OR . A paper copy in the mail If you have any lab test that is abnormal or we need to change your treatment, we will call you to review the results.   Testing/Procedures: None ordered   Follow-Up: At [facility], you and your health needs are our priority.  As part of our continuing mission to provide you with exceptional heart care, we have created designated Provider Care Teams.  These Care Teams include your primary Cardiologist (physician) and Advanced Practice Providers (APPs -  Physician Assistants and Nurse Practitioners) who all work together to provide you with the care you need, when you need it.  We recommend signing up for the patient portal called "MyChart".  Sign up information is provided on this After Visit Summary.  MyChart is used to connect with patients for Virtual Visits (Telemedicine).  Patients are able to view lab/test results, encounter notes, upcoming appointments, etc.  Non-urgent messages can be sent to your provider as well.   To learn more about what you can do with MyChart, go to [email or web address].    Your next appointment:   Follow up with Edd Fabian, NP in one month Follow up with Dr. Royann Shivers in 3 months    Signed, Thurmon Fair, MD  09/03/2020 3:57 PM    [facility]

## 2020-09-02 NOTE — Patient Instructions (Addendum)
Medication Instructions:  INCREASE the Furosemide to 80 mg once daily (2 of the 40 mg tablets) INCREASE the Hydralazine to 50 mg three times daily  *If you need a refill on your cardiac medications before your next appointment, please call your pharmacy*   Lab Work: None ordered If you have labs (blood work) drawn today and your tests are completely normal, you will receive your results only by: Marland Kitchen MyChart Message (if you have MyChart) OR . A paper copy in the mail If you have any lab test that is abnormal or we need to change your treatment, we will call you to review the results.   Testing/Procedures: None ordered   Follow-Up: At [facility], you and your health needs are our priority.  As part of our continuing mission to provide you with exceptional heart care, we have created designated Provider Care Teams.  These Care Teams include your primary Cardiologist (physician) and Advanced Practice Providers (APPs -  Physician Assistants and Nurse Practitioners) who all work together to provide you with the care you need, when you need it.  We recommend signing up for the patient portal called "MyChart".  Sign up information is provided on this After Visit Summary.  MyChart is used to connect with patients for Virtual Visits (Telemedicine).  Patients are able to view lab/test results, encounter notes, upcoming appointments, etc.  Non-urgent messages can be sent to your provider as well.   To learn more about what you can do with MyChart, go to [email or web address].    Your next appointment:   Follow up with Edd Fabian, NP in one month Follow up with Dr. Royann Shivers in 3 months

## 2020-09-03 ENCOUNTER — Encounter: Payer: Self-pay | Admitting: Cardiovascular Disease

## 2020-09-24 ENCOUNTER — Telehealth: Payer: Self-pay | Admitting: Internal Medicine

## 2020-09-24 NOTE — Telephone Encounter (Signed)
Spoke with patient's daughter, Blanchie Serve, regarding Palliative referral and she was in agreement with scheduling a visit.  I have scheduled an In-person Consult for 10/04/20 @ 11 AM.

## 2020-09-28 NOTE — Progress Notes (Signed)
Cardiology Clinic Note   Patient Name: Carlos Welch Date of Encounter: 10/01/2020  Primary Care Provider:  [name], PA-C Primary Cardiologist:  Thurmon Fair, MD  Patient Profile    Carlos Welch 74 year old presents the clinic today for follow-up evaluation of his atrial fibrillation and chronic CHF.  Past Medical History    Past Medical History:  Diagnosis Date  . A-fib (HCC)   . Abscess of jaw 07/19/2020  . Acute on chronic heart failure with preserved ejection fraction (HCC) 07/08/2020  . Aortic stenosis    a. s/p pericardial AVR 2015.  Marland Kitchen Aortic valve stenosis 07/19/2020  . Bacteremia 07/19/2020  . Bacterial endocarditis   . BPH (benign prostatic hyperplasia) 07/08/2020  . CAD (coronary artery disease)    a. s/p CABG, AVR, LAA clipping 2015 at [facility].  . Cellulitis of lower extremity 07/08/2020  . Chronic atrial fibrillation (HCC) 07/08/2020  . Chronic combined systolic and diastolic CHF (congestive heart failure) (HCC)   . Cognitive communication deficit 07/19/2020  . Coronary artery disease involving coronary bypass graft of native heart without angina pectoris 07/19/2020  . Debility 07/19/2020  . 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 Elevated troponin 07/08/2020  . Essential hypertension 07/08/2020  . Generalized abdominal pain   . History of noncompliance with medical treatment   . Hyperlipemia 07/08/2020  . 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  . Malnutrition (HCC)   . MRSA bacteremia   . 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 Normocytic anemia 07/08/2020  . Pressure injury of skin 05/12/2020  . 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 Severe malnutrition (HCC) 07/19/2020  . Thrombocytopenia (HCC) 07/08/2020  . TIA (transient ischemic attack)   . Weight loss 07/19/2020   Past Surgical History:  Procedure Laterality Date  . AORTIC VALVE REPLACEMENT    . CORONARY ARTERY  BYPASS GRAFT  2015  . HERNIA REPAIR    . REMOVAL OF IMPLANT  03/14/2020  . TEE WITHOUT CARDIOVERSION N/A 05/13/2020   Procedure: TRANSESOPHAGEAL ECHOCARDIOGRAM (TEE);  Surgeon: Lewayne Bunting, MD;  Location: [facility] ENDOSCOPY;  Service: Cardiovascular;  Laterality: N/A;    Allergies  Allergies  Allergen Reactions  . Chocolate Rash  . Chocolate Flavor Rash  . Peanut Oil Rash    History of Present Illness    [name] has a PMH of acute on chronic CHF, aortic valve stenosis, HTN, TIA, mitral valve regurgitation, chronic A. fib, and HLD. Aortic stenosis and CAD s/p CABG + pericardial AVR + LAA clipping in 2015  He was seen in emergency department 08/01/2020 for abdominal pain and shortness of breath.  This was his ninth ED visit in 6 months.  He was treated earlier in the day and discharged home.  It was felt that his abdominal pain was chronic and it was not clear whether he was taking this medication or not.  It was also noted that he had been hospitalized recently in [place] and [place] and had an episode of bacteremia, and dyspnea.  He admitted to taking his Lasix.  Patient's daughter was contacted about his chronic pain condition.  Patient has not yet followed up with pain specialist.  He was treated with 1 dose of pain medication and instructed to follow-up with pain management.  He was seen in the hospital August 2021 after a bout of septic shock, shortness of breath and multiple comorbidities.  His mitral valve regurgitation was found to  be moderate to severe by echo with a possibly ruptured chordae and normal LV function.  He was felt to be too high of a risk for mitraclip due to his restricted leaflets.  His medical management was continued.  Blood pressure was well controlled.  A. fib well controlled.  He presented to the clinic 08/03/2020 for follow-up evaluation stated he felt well.  He presented with his daughter who stated that she had been helping with his medication  and monitoring his weight.  She stated that he had been taking his furosemide daily due to fluid accumulation.  He was also living with her at this time due to his health needs and she was relocating him from [place].  He had been weighing himself daily and his dry weight was between 166 and 168.  He stated that when he would get up to 175 his breathing became very short and he noticed difficulty breathing and in recumbent positions.  We  talked about the importance of restricting salt in the diet, fluid restriction, and compliance with medications.  He stated that he did not sleep well unless he was at the hospital and receiving pain medication.  I instructed him to follow-up with his PCP to request sleep aid.  I  gave him the salty 6 diet sheet, had him weigh himself daily and contact the office with a weight increase of 3 pounds overnight or 5 pounds in 1 week.  We will order a BMP  and planned  follow-up in 3 months.  He was admitted to the hospital with CHF exacerbation 10/24 until 08/13/2020.  On admission his weight was 164.5 pounds and at discharge his dry weight was 143 pounds.  During his follow-up appointment with Dr. Royann Shivers his weight was 170 pounds.  He indicated that his home scale was reading 160 pounds.  His daughter had been keeping track of his weights and upon review showed 88 gained around 9 pounds.  He was noted to have significant lower extremity edema.  He denied orthopnea and PND.  His blood pressure was 147/119 when he arrived to the hospital and with his follow-up visit was 150/92.  He denied chest pain.  He denied dizziness and syncope.  His baseline BNP was around 500 and with his most recent hospitalization was 1200.  His proBNP 08/24/20 was 370 with a documented weight of 156 pounds.  His EKG showed atrial fibrillation that was rate controlled.  He was cardiac unaware.  It was recommended that his diuretic medication be increased.  However he was reluctant because he felt he  was doing fine.  He minimized the presence of lower extremity edema.  He underwent right and left heart catheterization as well as TEE.  He was noted to have thickening of the mitral leaflets which appeared restricted.  He is not a candidate for surgical mitral valve repair or MitraClip.  He presents to the clinic today for follow-up evaluation states he feels well.  He presents with his daughter.  [facility] has been [facility] and doing physical therapy 2 times per week.  He is now walking on his own on off days.  His weight is down 3 pounds.  He reports compliance with daily weights, blood pressure medication, and blood pressure measurements.  States overall he feels well.  He has been using "no salt".  I advised against this and recommended using pepper and different types of seasonings to add taste/flavor to food.  I will  give him salty 6 diet sheet and have him follow-up in 3 months.  Today he denies chest pain, shortness of breath, lower extremity edema, fatigue, palpitations, melena, hematuria, hemoptysis, diaphoresis, weakness, presyncope, syncope, orthopnea, and PND.  Home Medications    Prior to Admission medications   Medication Sig Start Date End Date Taking? Authorizing Provider  apixaban (ELIQUIS) 5 MG TABS tablet Take 1 tablet (5 mg total) by mouth in the morning and at bedtime. 05/19/20   Lewie Chamber, MD  carvedilol (COREG) 6.25 MG tablet Take 1 tablet (6.25 mg total) by mouth 2 (two) times daily with a meal. 05/19/20   Lewie Chamber, MD  furosemide (LASIX) 40 MG tablet Take 2 tablets (80 mg total) by mouth daily. 09/02/20 12/31/20  Croitoru, Mihai, MD  Ginkgo Biloba 40 MG TABS Take 1 tablet by mouth daily.    [provider]  HEMP OIL-VANILLYL BUTYL ETHER EX Apply 1 application topically daily.    [provider]  hydrALAZINE (APRESOLINE) 50 MG tablet Take 1 tablet (50 mg total) by mouth every 8 (eight) hours. 09/02/20 12/31/20  Croitoru,  Mihai, MD  HYDROcodone-acetaminophen (NORCO) 5-325 MG tablet Take 0.5 tablets by mouth every 4 (four) hours as needed (for pain). 08/07/20   Molpus, John, MD  ipratropium (ATROVENT) 0.02 % nebulizer solution Take 2.5 mLs (0.5 mg total) by nebulization in the morning, at noon, and at bedtime. 07/28/20   Hunsucker, Lesia Sago, MD  losartan (COZAAR) 100 MG tablet Take 1 tablet (100 mg total) by mouth daily. 06/09/20   Saguier, [name], PA-C  Multiple Vitamin (MULTIVITAMIN WITH MINERALS) TABS tablet Take 1 tablet by mouth daily.    [provider]  ondansetron (ZOFRAN ODT) 4 MG disintegrating tablet Take 1 tablet (4 mg total) by mouth every 8 (eight) hours as needed for nausea or vomiting. 07/07/20   Saguier, [name], PA-C  OXYGEN Inhale 2-3 sprays into the lungs every 4 (four) hours as needed (for shortness of breath).    [provider]  potassium chloride SA (KLOR-CON) 20 MEQ tablet Take 20 mEq by mouth daily with breakfast.    [provider]  TURMERIC PO Take 1 tablet by mouth daily.    [provider]    Family History    Family History  Problem Relation Age of Onset  . CAD Neg Hx    He indicated that the status of his neg hx is unknown.  Social History    Social History   Socioeconomic History  . Marital status: Divorced    Spouse name: Not on file  . Number of children: Not on file  . Years of education: Not on file  . Highest education level: Not on file  Occupational History  . Not on file  Tobacco Use  . Smoking status: Former Smoker    Packs/day: 3.00    Years: 47.00    Pack years: 141.00    Types: Cigarettes, Cigars    Start date: 1964    Quit date: 07/28/2010    Years since quitting: 10.1  . Smokeless tobacco: Never Used  Substance and Sexual Activity  . Alcohol use: No  . Drug use: No  . Sexual activity: Not on file  Other Topics Concern  . Not on file  Social History Narrative  . Not on file   Social Determinants of Health    Financial Resource Strain: Not on file  Food Insecurity: Not on file  Transportation Needs: Not on file  Physical Activity:  Not on file  Stress: Not on file  Social Connections: Not on file  Intimate Partner Violence: Not on file     Review of Systems    General:  No chills, fever, night sweats or weight changes.  Cardiovascular:  No chest pain, dyspnea on exertion, edema, orthopnea, palpitations, paroxysmal nocturnal dyspnea. Dermatological: No rash, lesions/masses Respiratory: No cough, dyspnea Urologic: No hematuria, dysuria Abdominal:   No nausea, vomiting, diarrhea, bright red blood per rectum, melena, or hematemesis Neurologic:  No visual changes, wkns, changes in mental status. All other systems reviewed and are otherwise negative except as noted above.  Physical Exam    VS:  BP 128/84   Pulse 60   Wt 167 lb (75.8 kg)   SpO2 95%   BMI 21.44 kg/m  , BMI Body mass index is 21.44 kg/m. GEN: Well nourished, well developed, in no acute distress. HEENT: normal. Neck: Supple, no JVD, carotid bruits, or masses. Cardiac: RRR, no murmurs, rubs, or gallops. No clubbing, cyanosis, edema.  Radials/DP/PT 2+ and equal bilaterally.  Respiratory:  Respirations regular and unlabored, clear to auscultation bilaterally. GI: Soft, nontender, nondistended, BS + x 4. MS: no deformity or atrophy. Skin: warm and dry, no rash. Neuro:  Strength and sensation are intact. Psych: Normal affect.  Accessory Clinical Findings    Recent Labs: 05/13/2020: TSH 1.178 08/08/2020: B Natriuretic Peptide 1,288.7 08/09/2020: Magnesium 1.8 08/24/2020: ALT 15; BUN 18; Creatinine, Ser 0.93; Hemoglobin 12.5; Platelets 174.0; Potassium 4.8; Pro B Natriuretic peptide (BNP) 370.0; Sodium 137   Recent Lipid Panel No results found for: CHOL, TRIG, HDL, CHOLHDL, VLDL, LDLCALC, LDLDIRECT  ECG personally reviewed by me today- none today.   EKG 08/03/2020 atrial fibrillation with slow ventricular response  incomplete left bundle branch block ST and T wave abnormality consider inferolateral ischemia  50 bpm  EKG 06/01/2020 Atrial fibrillation PVCs, borderline right axis deviation with nonspecific T wave abnormality, borderline prolonged QT of 479.  Echocardiogram 05/12/2020 IMPRESSIONS    1. Left ventricular ejection fraction, by estimation, is 55 to 60%. The  left ventricle has normal function. The left ventricle has no regional  wall motion abnormalities. There is moderate concentric left ventricular  hypertrophy. Left ventricular  diastolic parameters are indeterminate.  2. Right ventricular systolic function is normal. The right ventricular  size is normal. There is moderately elevated pulmonary artery systolic  pressure.  3. Left atrial size was severely dilated.  4. Right atrial size was severely dilated.  5. The mitral valve is normal in structure. Mild mitral valve  regurgitation. No evidence of mitral stenosis.  6. There is thickening of the septal leaflet. The tricuspid valve is  abnormal.  7. Perivalvular thickening around the bioprosthetic aortic valve. The  valve appears to be stable and there is no rocking motion. In the setting  of bacteremia concern for endocarditis/abscess. TEE recommended.. The  aortic valve has been  repaired/replaced. Aortic valve regurgitation is not visualized. No aortic  stenosis is present. There is a unknown bioprosthetic tilting disk valve  present in the aortic position. Procedure Date: 2015. Echo findings are  consistent with normal structure  and function of the aortic valve prosthesis. Aortic valve area, by VTI  measures 1.39 cm. Aortic valve mean gradient measures 10.0 mmHg. Aortic  valve Vmax measures 2.16 m/s.  8. Aortic dilatation noted. There is mild dilatation of the ascending  aorta measuring 38 mm.  9. The inferior vena cava is dilated in size with >50% respiratory  variability, suggesting  right atrial pressure of 8  mmHg.    Assessment & Plan   1.  Chronic diastolic heart failure/mitral regurgitation-euvolemic.  Weight today 167 pounds.  No increased work of breathing today.  Most recent hospitalization 08/08/2020 weight on admission 164.5 pounds in weight on discharge 143 pounds.  On follow-up weight loss 170 pounds in the office and his diuresis was increased. Echocardiogram 05/12/2020 reviewed in the hospital during last admission.  He was felt to not be a candidate for MitraClip due to his restricted leaflets.  Medical management was continued. Continue furosemide, carvedilol, potassium Heart healthy low-sodium diet-salty 6 given Increase physical activity as tolerated Lower extremity support stockings-continue Contact office with a weight increase of 3 pounds overnight or 5 pounds in 1 week Fluid restriction 48-64 ounces   Essential hypertension-BP today  128/84.  Well-controlled at home Continue carvedilol, losartan Heart healthy low-sodium diet-salty 6 given-reviewed Increase physical activity as tolerated  Atrial fibrillation-heart rate today 60.  No increased shortness of breath or DOE. Continue carvedilol, Eliquis Heart healthy low-sodium diet-salty 6 given Increase physical activity as tolerated  Disposition: Follow-up with Dr. Royann Shivers in 3 months.   Thomasene Ripple. Lynda Wanninger NP-C    10/01/2020, 9:38 AM [facility] [address] Office [phone] Fax [phone]  Notice: This dictation was prepared with Dragon dictation along with smaller phrase technology. Any transcriptional errors that result from this process are unintentional and may not be corrected upon review.

## 2020-10-01 ENCOUNTER — Other Ambulatory Visit: Payer: Self-pay

## 2020-10-01 ENCOUNTER — Encounter: Payer: Self-pay | Admitting: General Practice

## 2020-10-01 ENCOUNTER — Ambulatory Visit (INDEPENDENT_AMBULATORY_CARE_PROVIDER_SITE_OTHER): Payer: Medicare Other | Admitting: General Practice

## 2020-10-01 VITALS — BP 128/84 | HR 60 | Wt 167.0 lb

## 2020-10-01 DIAGNOSIS — I10 Essential (primary) hypertension: Secondary | ICD-10-CM | POA: Diagnosis not present

## 2020-10-01 DIAGNOSIS — I4819 Other persistent atrial fibrillation: Secondary | ICD-10-CM

## 2020-10-01 DIAGNOSIS — I5032 Chronic diastolic (congestive) heart failure: Secondary | ICD-10-CM | POA: Diagnosis not present

## 2020-10-01 NOTE — Patient Instructions (Signed)
Medication Instructions:  °Continue current medications ° °*If you need a refill on your cardiac medications before your next appointment, please call your pharmacy* ° ° °Lab Work: °None Ordered ° ° °Testing/Procedures: °None Ordered ° ° °Follow-Up: °At [facility], you and your health needs are our priority.  As part of our continuing mission to provide you with exceptional heart care, we have created designated Provider Care Teams.  These Care Teams include your primary Cardiologist (physician) and Advanced Practice Providers (APPs -  Physician Assistants and Nurse Practitioners) who all work together to provide you with the care you need, when you need it. ° °We recommend signing up for the patient portal called "MyChart".  Sign up information is provided on this After Visit Summary.  MyChart is used to connect with patients for Virtual Visits (Telemedicine).  Patients are able to view lab/test results, encounter notes, upcoming appointments, etc.  Non-urgent messages can be sent to your provider as well.   °To learn more about what you can do with MyChart, go to [email or web address].   ° °Your next appointment:   °3 month(s) ° °The format for your next appointment:   °In Person ° °Provider:   °You may see Mihai Croitoru, MD or one of the following Advanced Practice Providers on your designated Care Team:   °· Hao Meng, PA-C °· Angela Duke, PA-C or  °· Krista Kroeger, PA-C ° ° ° ° °

## 2020-10-04 ENCOUNTER — Other Ambulatory Visit: Payer: Self-pay

## 2020-10-04 ENCOUNTER — Other Ambulatory Visit: Payer: Medicare Other | Admitting: Internal Medicine

## 2020-10-04 DIAGNOSIS — R5381 Other malaise: Secondary | ICD-10-CM

## 2020-10-04 DIAGNOSIS — Z515 Encounter for palliative care: Secondary | ICD-10-CM

## 2020-10-04 DIAGNOSIS — Z7189 Other specified counseling: Secondary | ICD-10-CM

## 2020-10-04 DIAGNOSIS — R600 Localized edema: Secondary | ICD-10-CM

## 2020-10-04 DIAGNOSIS — I872 Venous insufficiency (chronic) (peripheral): Secondary | ICD-10-CM

## 2020-10-18 ENCOUNTER — Telehealth: Payer: Self-pay

## 2020-10-18 NOTE — Telephone Encounter (Signed)
Carlos Welch from [facility] needing VO for PT re-certification two week two, one week two.  Beginning week of 10/18/2020.

## 2020-10-19 NOTE — Telephone Encounter (Signed)
Request for extension on PT referral please advise.

## 2020-10-19 NOTE — Telephone Encounter (Signed)
Verbal authorization ok for PT. Advise they can send fax order as well and will sign.

## 2020-10-22 ENCOUNTER — Telehealth: Payer: Self-pay | Admitting: Medical

## 2020-10-22 NOTE — Telephone Encounter (Signed)
Caller: Natalia Leatherwood ([facility] 0 PT Call back # [phone]  States patient blood pressure is 120/50 experiencing some dizziness this was prior of taking blood pressure medication.

## 2020-10-25 NOTE — Telephone Encounter (Signed)
Carlos Welch called back in regards to VO . VO was given today for pt

## 2020-10-25 NOTE — Telephone Encounter (Signed)
I am just now seeing this after hours. Can you get pt to do video visit or in office. Tomorrow or Wednesday.   He is very complicated.   One mild low bp but would 120/50. Did they end up giving him regular meds. Can they check his bp tonight and tomorrow morning. Give me readings.   Also what are his weights.  Maybe he is over diuresed.  For video or office appointment need to know his weight since maybe over diuresed effecting blood pressure.  If can't do video then needs office visit. He would need 40 minute slot.

## 2020-10-26 NOTE — Telephone Encounter (Signed)
Ok. Since improved can follow up feb.

## 2020-10-26 NOTE — Telephone Encounter (Signed)
Spoke with wife and she states patient is doing better , nurse came yesterday and BP was 120/80 , denies any dizziness or other side effects , doesn't want appointment this week, will keep follow up appt in feb or follow up as neeeded

## 2020-11-02 NOTE — Progress Notes (Signed)
Therapist, nutritional Palliative Care Consult Note Telephone: [phone]  Fax: [phone]  PATIENT NAME: Carlos Welch DOB: 1946/07/04 MRN: [id number]  PRIMARY CARE PROVIDER:   Marisue Brooklyn  REFERRING PROVIDER:  [name], PA-C [address] [place],  [place] [address]  RESPONSIBLE PARTY:   Self and daughter  Blanchie Serve Daughter   [phone]  RECOMMENDATIONS and PLAN: Palliative care encounter  Z51.5  1.  Advance care planning:  Discussion of advanced directives.  MOST form completed with delegations of attempt CPR, limited additional interventions, antibiotics if indicated, trial IV fluids and tube feedings.  Document was uploaded into Epic and left with patient and daughter for home display.  Pt's goals are to continue to gain strength, prevent CHF exacerbations and maintain independence while living with his daughter. Palliative care will f/u with patient in aprox. 1 month  2.  Edema:  Related to CHF/venous insufficiency.  Strongly encouraged use of diuretics as prescribed. Weigh daily and report abnormal findings as instructed. Compression socks and leg elevation.  Educated on methods to prevent CHF exacerbations.  Continue to monitor.  3.  Debilitated patient:  Improving since relocating to daughter's home.  Continue balanced meals and hydration. Encouraged to continue follow-up appointments with providers.     I spent 40 minutes providing this consultation,  from 1100 to 1140. More than 50% of the time in this consultation was spent coordinating communication with patient and his daughter.   HISTORY OF PRESENT ILLNESS:  Carlos Welch is a 75 y.o. year old male with multiple medical problems including CHF.  Daughter reports that patient recently relocated from [facility] where he was living on his own but not caring for himself very well. His health has improved since recent hospitalization related to heart failure exacerbation. He is able to perform his own ADLs with minimal assistance and is able to feed self.  Palliative Care was asked to help address goals of care.   CODE STATUS: Full Code  PPS: 50% HOSPICE ELIGIBILITY/DIAGNOSIS: TBD  PAST MEDICAL HISTORY:  Past Medical History:  Diagnosis Date  . A-fib (HCC)   . Abscess of jaw 07/19/2020  . Acute on chronic heart failure with preserved ejection fraction (HCC) 07/08/2020  . Aortic stenosis    a. s/p pericardial AVR 2015.  Marland Kitchen Aortic valve stenosis 07/19/2020  . Bacteremia 07/19/2020  . Bacterial endocarditis   . BPH (benign prostatic hyperplasia) 07/08/2020  . CAD (coronary artery disease)    a. s/p CABG, AVR, LAA clipping 2015 at [facility].  . Cellulitis of lower extremity 07/08/2020  . Chronic atrial fibrillation (HCC) 07/08/2020  . Chronic combined systolic and diastolic CHF (congestive heart failure) (HCC)   . Cognitive communication deficit 07/19/2020  . Coronary artery disease involving coronary bypass graft of native heart without angina pectoris 07/19/2020  . Debility 07/19/2020  . 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 Elevated troponin 07/08/2020  . Essential hypertension 07/08/2020  . Generalized abdominal pain   . History of noncompliance with medical treatment   . Hyperlipemia 07/08/2020  . 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  . Malnutrition (HCC)   . MRSA bacteremia   . Nonrheumatic  mitral valve regurgitation 07/19/2020  . Normocytic anemia 07/08/2020  . Pressure injury of skin 05/12/2020  . 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 Severe malnutrition (HCC) 07/19/2020  . Thrombocytopenia (HCC) 07/08/2020  . TIA (transient ischemic attack)   . Weight loss 07/19/2020    SOCIAL HX: Lives with daughter Social History   Tobacco Use  . Smoking status: Former Smoker    Packs/day: 3.00    Years: 47.00    Pack years: 141.00    Types: Cigarettes, Cigars    Start date: 1964    Quit date: 07/28/2010    Years since quitting: 10.2  .  Smokeless tobacco: Never Used  Substance Use Topics  . Alcohol use: No    ALLERGIES:  Allergies  Allergen Reactions  . Chocolate Rash  . Chocolate Flavor Rash  . Peanut Oil Rash     PERTINENT MEDICATIONS:  Outpatient Encounter Medications as of 10/04/2020  Medication Sig  . apixaban (ELIQUIS) 5 MG TABS tablet Take 1 tablet (5 mg total) by mouth in the morning and at bedtime.  . carvedilol (COREG) 6.25 MG tablet Take 1 tablet (6.25 mg total) by mouth 2 (two) times daily with a meal.  . furosemide (LASIX) 40 MG tablet Take 2 tablets (80 mg total) by mouth daily.  . Ginkgo Biloba 40 MG TABS Take 1 tablet by mouth daily.  Marland Kitchen HEMP OIL-VANILLYL BUTYL ETHER EX Apply 1 application topically daily.  . hydrALAZINE (APRESOLINE) 50 MG tablet Take 1 tablet (50 mg total) by mouth every 8 (eight) hours. (Patient not taking: Reported on 10/01/2020)  . HYDROcodone-acetaminophen (NORCO) 5-325 MG tablet Take 0.5 tablets by mouth every 4 (four) hours as needed (for pain). (Patient not taking: Reported on 10/01/2020)  . ipratropium (ATROVENT) 0.02 % nebulizer solution Take 2.5 mLs (0.5 mg total) by nebulization in the morning, at noon, and at bedtime.  Marland Kitchen losartan (COZAAR) 100 MG tablet Take 1 tablet (100 mg total) by mouth daily.  . Multiple Vitamin (MULTIVITAMIN WITH MINERALS) TABS tablet Take 1 tablet by mouth daily.  . ondansetron  (ZOFRAN ODT) 4 MG disintegrating tablet Take 1 tablet (4 mg total) by mouth every 8 (eight) hours as needed for nausea or vomiting.  . OXYGEN Inhale 2-3 sprays into the lungs every 4 (four) hours as needed (for shortness of breath). (Patient not taking: Reported on 10/01/2020)  . potassium chloride SA (KLOR-CON) 20 MEQ tablet Take 20 mEq by mouth daily with breakfast.  . TURMERIC PO Take 1 tablet by mouth daily.   No facility-administered encounter medications on file as of 10/04/2020.    PHYSICAL EXAM:   General: NAD, chronically ill and  frail appearing, thin elderly male Cardiovascular: regular rate and rhythm Pulmonary: clear ant fields Abdomen: soft, nontender, + bowel sounds Extremities: 1+ edema BLE Skin:exposed skin is intact Neurological: Alert and oriented x 3, generalized weakness   Margaretha Sheffield, NP-C

## 2020-11-08 ENCOUNTER — Telehealth: Payer: Self-pay | Admitting: Medical

## 2020-11-08 NOTE — Telephone Encounter (Signed)
What happened. What is the issue with current rehab. States they came to agreement. Was he unhappy. What was the issue. If pt or family reaches out for [facility] and they send order request will sign

## 2020-11-08 NOTE — Telephone Encounter (Signed)
In home Physical Therapist called stating that patient will be discharged as of today. They have come to an agreement that patient should  Have a referral sent to Outpatient PT at [facility] for further Treatment.

## 2020-11-09 ENCOUNTER — Telehealth: Payer: Self-pay | Admitting: Cardiovascular Disease

## 2020-11-09 MED ORDER — APIXABAN 5 MG PO TABS
5.0000 mg | ORAL_TABLET | Freq: Two times a day (BID) | ORAL | 6 refills | Status: DC
Start: 2020-11-09 — End: 2021-06-27

## 2020-11-09 NOTE — Telephone Encounter (Signed)
Rx has been sent to the pharmacy electronically. ° °

## 2020-11-09 NOTE — Telephone Encounter (Signed)
Rep from [facility] dropped off document to be filled out by provider ( Physical Referral Form 1 page) When documents ready please fax paperwork to [phone]. Document put at front office tray under providers name.

## 2020-11-09 NOTE — Telephone Encounter (Signed)
*  STAT* If patient is at the pharmacy, call can be transferred to refill team.   1. Which medications need to be refilled? (please list name of each medication and dose if known)   apixaban (ELIQUIS) 5 MG TABS tablet   2. Which pharmacy/location (including street and city if local pharmacy) is medication to be sent to?  [id number] - [place], [place] - [address]  3. Do they need a 30 day or 90 day supply? 30  Patients daughter is calling stating that he was prescribed this medication in the hospital a while ago and is currently out of this medication and needs it refilled. Please advise.

## 2020-11-09 NOTE — Telephone Encounter (Signed)
Form placed in edward's basket

## 2020-11-13 ENCOUNTER — Telehealth: Payer: Self-pay | Admitting: Medical

## 2020-11-13 NOTE — Telephone Encounter (Signed)
Will you get pt scheduled for video visit. I have a PT referral form to sign. I gave verbal authorization for prior PT. Technically he needs office visit within 30 days of order. Get pt scheduled for Tuesday afternoon.

## 2020-11-15 NOTE — Telephone Encounter (Signed)
Pt has appointment 2/9

## 2020-11-17 ENCOUNTER — Ambulatory Visit: Payer: Medicare Other | Admitting: Cardiovascular Disease

## 2020-11-24 ENCOUNTER — Encounter: Payer: Self-pay | Admitting: Medical

## 2020-11-24 ENCOUNTER — Ambulatory Visit (INDEPENDENT_AMBULATORY_CARE_PROVIDER_SITE_OTHER): Payer: Medicare Other | Admitting: Medical

## 2020-11-24 ENCOUNTER — Telehealth: Payer: Self-pay | Admitting: Medical

## 2020-11-24 ENCOUNTER — Ambulatory Visit (HOSPITAL_BASED_OUTPATIENT_CLINIC_OR_DEPARTMENT_OTHER)
Admission: RE | Admit: 2020-11-24 | Discharge: 2020-11-24 | Disposition: A | Discharge status: Home / Self Care | Payer: Medicare Other | Source: Ambulatory Visit | Attending: Medical | Admitting: Medical

## 2020-11-24 ENCOUNTER — Other Ambulatory Visit: Payer: Self-pay

## 2020-11-24 VITALS — BP 160/88 | HR 66 | Ht 74.0 in | Wt 176.0 lb

## 2020-11-24 DIAGNOSIS — R5381 Other malaise: Secondary | ICD-10-CM

## 2020-11-24 DIAGNOSIS — I509 Heart failure, unspecified: Secondary | ICD-10-CM

## 2020-11-24 DIAGNOSIS — I482 Chronic atrial fibrillation, unspecified: Secondary | ICD-10-CM | POA: Diagnosis not present

## 2020-11-24 DIAGNOSIS — R269 Unspecified abnormalities of gait and mobility: Secondary | ICD-10-CM

## 2020-11-24 DIAGNOSIS — R600 Localized edema: Secondary | ICD-10-CM

## 2020-11-24 DIAGNOSIS — Z7409 Other reduced mobility: Secondary | ICD-10-CM | POA: Diagnosis not present

## 2020-11-24 LAB — COMPREHENSIVE METABOLIC PANEL
ALT: 10 U/L (ref 0–53)
AST: 22 U/L (ref 0–37)
Albumin: 4.1 g/dL (ref 3.5–5.2)
Alkaline Phosphatase: 140 U/L — ABNORMAL HIGH (ref 39–117)
BUN: 20 mg/dL (ref 6–23)
CO2: 29 mEq/L (ref 19–32)
Calcium: 9.6 mg/dL (ref 8.4–10.5)
Chloride: 103 mEq/L (ref 96–112)
Creatinine, Ser: 1 mg/dL (ref 0.40–1.50)
GFR: 74.78 mL/min (ref >60.00)
Glucose, Bld: 86 mg/dL (ref 70–99)
Potassium: 4.7 mEq/L (ref 3.5–5.1)
Sodium: 138 mEq/L (ref 135–145)
Total Bilirubin: 1.6 mg/dL — ABNORMAL HIGH (ref 0.2–1.2)
Total Protein: 7.3 g/dL (ref 6.0–8.3)

## 2020-11-24 LAB — BRAIN NATRIURETIC PEPTIDE: Pro B Natriuretic peptide (BNP): 572 pg/mL — ABNORMAL HIGH (ref 0.0–100.0)

## 2020-11-24 MED ORDER — DOXYCYCLINE HYCLATE 100 MG PO TABS: 100.0000 mg | ORAL_TABLET | Freq: Two times a day (BID) | ORAL | 0 refills | Status: DC

## 2020-11-24 NOTE — Patient Instructions (Signed)
History of CHF, atrial fibrillation and hypertension with recent pedal edema possible approximate 6 pound weight gain.  Will get chest x-ray today, BNP and metabolic panel.  Continue Lasix 40 mg 2 tabs daily, Eliquis 5 mg daily and Coreg 6.25 mg daily.  Your blood pressure is elevated today but you did not take Lasix this morning.  Take your Lasix when you get home and check your blood pressure.  If blood pressure is over 140/90 then I want you to take your hydralazine 50 mg 3 times a day.  Presently you are on hydralazine twice daily but last prescription stated 3 times daily.  Keep appointment with cardiologist February 22.  For pulmonary debility, gait difficulty and physical deconditioning went ahead and signed PT order.  Follow-up date to be determined after lab and x-ray review.

## 2020-11-24 NOTE — Telephone Encounter (Signed)
Rx doxycycline sent to pt pharmacy. 

## 2020-11-24 NOTE — Progress Notes (Signed)
Subjective:    Patient ID: Carlos Welch, male    DOB: 1946-02-23, 75 y.o.   MRN: [id number]  HPI  Pt in for follow up.  Pt may see cardiologist in a month or so.  Last hpi from caridiologist.   Carlos Welch a PMH of acute on chronic CHF, aortic valve stenosis, HTN, TIA, mitral valve regurgitation, chronic A. fib, and HLD.Aortic stenosis and CAD s/p CABG + pericardial AVR + LAA clipping in 2015  Last echo.  IMPRESSIONS   1. Left ventricular ejection fraction, by estimation, is 55 to 60%. The  left ventricle has normal function. The left ventricle has no regional  wall motion abnormalities. There is moderate concentric left ventricular  hypertrophy. Left ventricular  diastolic parameters are indeterminate.   A/P from cardio visit.    Assessment & Plan   1.  Chronic diastolic heart failure/mitral regurgitation-euvolemic.  Weight today 167 pounds.  No increased work of breathing today.  Most recent hospitalization 08/08/2020 weight on admission 164.5 pounds in weight on discharge 143 pounds.  On follow-up weight loss 170 pounds in the office and his diuresis was increased.Echocardiogram 05/12/2020 reviewed in the hospital during last admission. He was felt to not be a candidate for MitraClip due to his restricted leaflets. Medical management was continued. Continuefurosemide, carvedilol, potassium Heart healthy low-sodium diet-salty 6 given Increase physical activity as tolerated Lower extremity support stockings-continue Contact office with a weight increase of 3 pounds overnight or 5 pounds in 1 week Fluid restriction 48-64 ounces   Essential hypertension-BP today 128/84. Well-controlled at home Continuecarvedilol, losartan Heart healthy low-sodium diet-salty 6 given-reviewed Increase physical activity as tolerated  Atrial fibrillation-heart rate today 60. No increased shortness of breath or DOE. Continuecarvedilol, Eliquis Heart healthy low-sodium diet-salty  6 given Increase physical activity as tolerated    Pt states that he weights himself daily and weight is 170 lb. Daughter thinks sometimes he does not tell truth on weights. Pt did not take lasix this morning.  Pt states when home health checked his bp was well controlled.          Review of Systems  Constitutional: Negative for chills and fatigue.  Respiratory: Negative for cough, chest tightness, shortness of breath and wheezing.   Cardiovascular: Negative for chest pain and palpitations.  Gastrointestinal: Negative for abdominal pain.  Neurological: Negative for dizziness, tremors, numbness and headaches.  Hematological: Negative for adenopathy. Does not bruise/bleed easily.  Psychiatric/Behavioral: Negative for behavioral problems and confusion.    Past Medical History:  Diagnosis Date  . A-fib (HCC)   . Abscess of jaw 07/19/2020  . Acute on chronic heart failure with preserved ejection fraction (HCC) 07/08/2020  . Aortic stenosis    a. s/p pericardial AVR 2015.  Marland Kitchen Aortic valve stenosis 07/19/2020  . Bacteremia 07/19/2020  . Bacterial endocarditis   . BPH (benign prostatic hyperplasia) 07/08/2020  . CAD (coronary artery disease)    a. s/p CABG, AVR, LAA clipping 2015 at [facility].  . Cellulitis of lower extremity 07/08/2020  . Chronic atrial fibrillation (HCC) 07/08/2020  . Chronic combined systolic and diastolic CHF (congestive heart failure) (HCC)   . Cognitive communication deficit 07/19/2020  . Coronary artery disease involving coronary bypass graft of native heart without angina pectoris 07/19/2020  . Debility 07/19/2020  . 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 Elevated troponin 07/08/2020  . Essential hypertension 07/08/2020  . Generalized abdominal pain   . History of noncompliance with medical  treatment   . Hyperlipemia 07/08/2020  . 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  . Malnutrition (HCC)   . MRSA bacteremia    . 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 Normocytic anemia 07/08/2020  . Pressure injury of skin 05/12/2020  . 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 Severe malnutrition (HCC) 07/19/2020  . Thrombocytopenia (HCC) 07/08/2020  . TIA (transient ischemic attack)   . Weight loss 07/19/2020     Social History   Socioeconomic History  . Marital status: Divorced    Spouse name: Not on file  . Number of children: Not on file  . Years of education: Not on file  . Highest education level: Not on file  Occupational History  . Not on file  Tobacco Use  . Smoking status: Former Smoker    Packs/day: 3.00    Years: 47.00    Pack years: 141.00    Types: Cigarettes, Cigars    Start date: 1964    Quit date: 07/28/2010    Years since quitting: 10.3  . Smokeless tobacco: Never Used  Substance and Sexual Activity  . Alcohol use: No  . Drug use: No  . Sexual activity: Not on file  Other Topics Concern  . Not on file  Social History Narrative  . Not on file   Social Determinants of Health   Financial Resource Strain: Not on file  Food Insecurity: Not on file  Transportation Needs: Not on file  Physical Activity: Not on file  Stress: Not on file  Social Connections: Not on file  Intimate Partner Violence: Not on file    Past Surgical History:  Procedure Laterality Date  . AORTIC VALVE REPLACEMENT    . CORONARY ARTERY BYPASS GRAFT  2015  . HERNIA REPAIR    . REMOVAL OF IMPLANT  03/14/2020  . TEE WITHOUT CARDIOVERSION N/A 05/13/2020   Procedure: TRANSESOPHAGEAL ECHOCARDIOGRAM (TEE);  Surgeon: Lewayne Bunting, MD;  Location[facility] ENDOSCOPY;  Service: Cardiovascular;  Laterality: N/A;    Family History  Problem Relation Age of Onset  . CAD Neg Hx     Allergies  Allergen Reactions  . Chocolate Rash  . Chocolate Flavor Rash  . Peanut Oil Rash    Current Outpatient Medications on File Prior to Visit  Medication Sig Dispense Refill  .  apixaban (ELIQUIS) 5 MG TABS tablet Take 1 tablet (5 mg total) by mouth in the morning and at bedtime. 60 tablet 6  . carvedilol (COREG) 6.25 MG tablet Take 1 tablet (6.25 mg total) by mouth 2 (two) times daily with a meal. 60 tablet 3  . furosemide (LASIX) 40 MG tablet Take 2 tablets (80 mg total) by mouth daily. 60 tablet 3  . Ginkgo Biloba 40 MG TABS Take 1 tablet by mouth daily.    Marland Kitchen HEMP OIL-VANILLYL BUTYL ETHER EX Apply 1 application topically daily.    Marland Kitchen ipratropium (ATROVENT) 0.02 % nebulizer solution Take 2.5 mLs (0.5 mg total) by nebulization in the morning, at noon, and at bedtime. 240 mL 11  . losartan (COZAAR) 100 MG tablet Take 1 tablet (100 mg total) by mouth daily. 90 tablet 3  . Multiple Vitamin (MULTIVITAMIN WITH MINERALS) TABS tablet Take 1 tablet by mouth daily.    . ondansetron (ZOFRAN ODT) 4 MG disintegrating tablet Take 1 tablet (4 mg total) by mouth every 8 (eight) hours as needed for nausea or vomiting. 20 tablet 0  . potassium chloride SA (KLOR-CON) 20 MEQ tablet  Take 20 mEq by mouth daily with breakfast.    . TURMERIC PO Take 1 tablet by mouth daily.    . hydrALAZINE (APRESOLINE) 50 MG tablet Take 1 tablet (50 mg total) by mouth every 8 (eight) hours. (Patient not taking: No sig reported) 90 tablet 3  . HYDROcodone-acetaminophen (NORCO) 5-325 MG tablet Take 0.5 tablets by mouth every 4 (four) hours as needed (for pain). (Patient not taking: No sig reported) 12 tablet 0  . OXYGEN Inhale 2-3 sprays into the lungs every 4 (four) hours as needed (for shortness of breath). (Patient not taking: No sig reported)     No current facility-administered medications on file prior to visit.    BP (!) 160/88   Pulse 66   Ht 6\' 2"  (1.88 m)   Wt 176 lb (79.8 kg)   SpO2 97%   BMI 22.60 kg/m       Objective:   Physical Exam  General Mental Status- Alert. General Appearance- Not in acute distress.   Skin General: Color- Normal Color. Moisture- Normal  Moisture.  Neck Carotid Arteries- Normal color. Moisture- Normal Moisture. No carotid bruits. No JVD.  Chest and Lung Exam Auscultation: Breath Sounds:-Normal.  Cardiovascular Auscultation:Rythm- Regular. Murmurs & Other Heart Sounds:Auscultation of the heart reveals- No Murmurs.  Abdomen Inspection:-Inspeection Normal. Palpation/Percussion:Note:No mass. Palpation and Percussion of the abdomen reveal- Non Tender, Non Distended + BS, no rebound or guarding.    Neurologic Cranial Nerve exam:- CN III-XII intact(No nystagmus), symmetric smile. Strength:- 5/5 equal and symmetric strength both upper and lower extremities.  Lower ext- bilateral 1-2+ pedal edema.        Assessment & Plan:  History of CHF, atrial fibrillation and hypertension with recent pedal edema possible approximate 6 pound weight gain.  Will get chest x-ray today, BNP and metabolic panel.  Continue Lasix 40 mg 2 tabs daily, Eliquis 5 mg daily and Coreg 6.25 mg daily.  Your blood pressure is elevated today but you did not take Lasix this morning.  Take your Lasix when you get home and check your blood pressure.  If blood pressure is over 140/90 then I want you to take your hydralazine 50 mg 3 times a day.  Presently you are on hydralazine twice daily but last prescription stated 3 times daily.  Keep appointment with cardiologist February 22.  For pulmonary debility, gait difficulty and physical deconditioning went ahead and signed PT order.  Follow-up date to be determined after lab and x-ray review.  February 24, PA-C

## 2020-11-25 ENCOUNTER — Ambulatory Visit: Payer: Medicare Other | Admitting: Cardiovascular Disease

## 2020-11-29 ENCOUNTER — Telehealth: Payer: Self-pay | Admitting: Medical

## 2020-11-29 NOTE — Telephone Encounter (Signed)
Paper work faxed again 

## 2020-11-29 NOTE — Telephone Encounter (Signed)
Caller: [name] ([facility]) Call back # [phone] Fax [phone]  Received referral they need demographic sheet, Insurance information along with office doctors notes.

## 2020-12-06 MED ORDER — CARVEDILOL 6.25 MG PO TABS: 6.2500 mg | ORAL_TABLET | Freq: Two times a day (BID) | ORAL | 3 refills | Status: DC

## 2020-12-06 NOTE — Progress Notes (Signed)
Cardiology Clinic Note   Patient Name: Orland PenmanDanny Vasko Date of Encounter: 12/07/2020  Primary Care Provider:  Esperanza RichtersSaguier, Edward, PA-C Primary Cardiologist:  Thurmon FairMihai Croitoru, MD  Patient Profile    [id number] year old presents the clinic today for follow-up evaluation of his atrial fibrillation and chronic CHF.  Past Medical History    Past Medical History:  Diagnosis Date  . A-fib (HCC)   . Abscess of jaw 07/19/2020  . Acute on chronic heart failure with preserved ejection fraction (HCC) 07/08/2020  . Aortic stenosis    a. s/p pericardial AVR 2015.  Marland Kitchen. Aortic valve stenosis 07/19/2020  . Bacteremia 07/19/2020  . Bacterial endocarditis   . BPH (benign prostatic hyperplasia) 07/08/2020  . CAD (coronary artery disease)    a. s/p CABG, AVR, [facility].  . Cellulitis of lower extremity 07/08/2020  . Chronic atrial fibrillation (HCC) 07/08/2020  . Chronic combined systolic and diastolic CHF (congestive heart failure) (HCC)   . Cognitive communication deficit 07/19/2020  . Coronary artery disease involving coronary bypass graft of native heart without angina pectoris 07/19/2020  . Debility 07/19/2020  . 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 Elevated troponin 07/08/2020  . Essential hypertension 07/08/2020  . Generalized abdominal pain   . History of noncompliance with medical treatment   . Hyperlipemia 07/08/2020  . 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  . Malnutrition (HCC)   . MRSA bacteremia   . 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 Normocytic anemia 07/08/2020  . Pressure injury of skin 05/12/2020  . 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 Severe malnutrition (HCC) 07/19/2020  . Thrombocytopenia (HCC) 07/08/2020  . TIA (transient ischemic attack)   . Weight loss 07/19/2020   Past Surgical History:  Procedure Laterality Date  . AORTIC VALVE REPLACEMENT    . CORONARY ARTERY  BYPASS GRAFT  2015  . HERNIA REPAIR    . REMOVAL OF IMPLANT  03/14/2020  . TEE WITHOUT CARDIOVERSION N/A 05/13/2020   Procedure: TRANSESOPHAGEAL ECHOCARDIOGRAM (TEE);  Surgeon: Lewayne Buntingrenshaw, Brian S, MD;  [facility];  Service: Cardiovascular;  Laterality: N/A;    Allergies  Allergies  Allergen Reactions  . Chocolate Rash  . Chocolate Flavor Rash  . Peanut Oil Rash    History of Present Illness    Mr. Sanda KleinRhewhas a PMH of acute on chronic CHF, aortic valve stenosis, HTN, TIA, mitral valve regurgitation, chronic A. fib, and HLD.Aortic stenosis and CAD s/p CABG + pericardial AVR + LAA clipping in 2015  He was seen in emergency department 08/01/2020 for abdominal pain and shortness of breath. This was his ninth ED visit in 6 months. He was treated earlier in the day and discharged home. It was felt that his abdominal pain was chronic and it was not clear whether he was taking this medication or not. It was also noted that [place] had been [place] in [place] and had an episode of bacteremia, and dyspnea. He admitted to taking his Lasix. Patient's daughter was contacted about his chronic pain condition. Patient has not yet followed up with pain specialist. He was treated with 1 dose of pain medication and instructed to follow-up with pain management.  He was seen in the hospital August 2021 after a bout of septic shock, shortness of breath and multiple comorbidities. His mitral valve regurgitation was found to be moderate to severe by echo with a possibly ruptured chordae and normal  LV function. He was felt to be too high of a risk for mitraclip due to his restricted leaflets. His medical management was continued. Blood pressure was well controlled. A. fib well controlled.  He presented to the clinic 08/03/2020 for follow-up evaluation statedhe felt well. He presented with his daughter who stated that she had been helping with his medication  and monitoring his weight. She stated that he had been taking his furosemide daily due to fluid accumulation. He was also living with her at this time due to his health needs and she was relocating him from [place]. He had been weighing himself daily and his dry weight was between 166 and 168. He stated that when he would get up to 175 his breathing became very short and he noticed difficulty breathing and in recumbent positions. We  talked about the importance of restricting salt in the diet, fluid restriction, and compliance with medications. He stated that he did not sleep well unless he was at the hospital and receiving pain medication. I instructed him to follow-up with his PCP to request sleep aid. I  gave him the salty 6 diet sheet, had him weigh himself daily and contact the office with a weight increase of 3 pounds overnight or 5 pounds in 1 week. We will order a BMP  and planned  follow-up in 3 months.  He was admitted to the hospital with CHF exacerbation 10/24 until 08/13/2020.  On admission his weight was 164.5 pounds and at discharge his dry weight was 143 pounds.  During his follow-up appointment with Dr. Royann Shivers his weight was 170 pounds.  He indicated that his home scale was reading 160 pounds.  His daughter had been keeping track of his weights and upon review showed 88 gained around 9 pounds.  He was noted to have significant lower extremity edema.  He denied orthopnea and PND.  His blood pressure was 147/119 when he arrived to the hospital and with his follow-up visit was 150/92.  He denied chest pain.  He denied dizziness and syncope.  His baseline BNP was around 500 and with his most recent hospitalization was 1200.  His proBNP 08/24/20 was 370 with a documented weight of 156 pounds.  His EKG showed atrial fibrillation that was rate controlled.  He was cardiac unaware.  It was recommended that his diuretic medication be increased.  However he was reluctant because he felt  he was doing fine.  He minimized the presence of lower extremity edema.  He underwent right and left heart catheterization as well as TEE.  He was noted to have thickening of the mitral leaflets which appeared restricted.  He is not a candidate for surgical mitral valve repair or MitraClip.  He presented to the clinic 10/01/2020 for follow-up evaluation stated he felt well.  He presented with his daughter.  He had been receiving visits from advanced home health and doing physical therapy 2 times per week.  He was walking on his own on off days.  His weight was down 3 pounds.  He reported compliance with daily weights, blood pressure medication, and blood pressure measurements.  Stated overall he felt well.  He had been using "no salt".  I advised against this and recommended using pepper and different types of seasonings to add taste/flavor to food.  I  gave him salty 6 diet sheet and planned follow-up in 3 months.  He presents the clinic today for follow-up evaluation and states he feels well.  He was  recently at his PCP who diagnosed him with pneumonia.  He has 2 more days of his antibiotics until he has completed his course.  He reports that in the past he has been diagnosed with pneumonia but felt it was diagnosed incorrectly.  He presented with his daughter and we reviewed pneumonia versus CHF exacerbation and chest x-rays.  They expressed understanding.  His weight has remained stable and he is tolerating his medications well.  He is no longer receiving home health and will start at a [employer] supportive gym this week.  He plans to attend 2 days/week.  Labs stable.  We will continue his low-salt diet, maintain physical activity, follow-up with Dr. Royann Shivers in 6 months.  Today hedenies chest pain, shortness of breath, lower extremity edema, fatigue, palpitations, melena, hematuria, hemoptysis, diaphoresis, weakness, presyncope, syncope, orthopnea, and PND.  Home Medications    Prior to Admission  medications   Medication Sig Start Date End Date Taking? Authorizing Provider  apixaban (ELIQUIS) 5 MG TABS tablet Take 1 tablet (5 mg total) by mouth in the morning and at bedtime. 11/09/20   Croitoru, Mihai, MD  carvedilol (COREG) 6.25 MG tablet Take 1 tablet (6.25 mg total) by mouth 2 (two) times daily with a meal. 05/19/20   Lewie Chamber, MD  doxycycline (VIBRA-TABS) 100 MG tablet Take 1 tablet (100 mg total) by mouth 2 (two) times daily. 11/24/20   Saguier, [name], PA-C  furosemide (LASIX) 40 MG tablet Take 2 tablets (80 mg total) by mouth daily. 09/02/20 12/31/20  Croitoru, Mihai, MD  Ginkgo Biloba 40 MG TABS Take 1 tablet by mouth daily.    [provider]  HEMP OIL-VANILLYL BUTYL ETHER EX Apply 1 application topically daily.    [provider]  hydrALAZINE (APRESOLINE) 50 MG tablet Take 1 tablet (50 mg total) by mouth every 8 (eight) hours. Patient not taking: No sig reported 09/02/20 12/31/20  Croitoru, Mihai, MD  HYDROcodone-acetaminophen (NORCO) 5-325 MG tablet Take 0.5 tablets by mouth every 4 (four) hours as needed (for pain). Patient not taking: No sig reported 08/07/20   Molpus, John, MD  ipratropium (ATROVENT) 0.02 % nebulizer solution Take 2.5 mLs (0.5 mg total) by nebulization in the morning, at noon, and at bedtime. 07/28/20   Hunsucker, Lesia Sago, MD  losartan (COZAAR) 100 MG tablet Take 1 tablet (100 mg total) by mouth daily. 06/09/20   Saguier, [name], PA-C  Multiple Vitamin (MULTIVITAMIN WITH MINERALS) TABS tablet Take 1 tablet by mouth daily.    [provider]  ondansetron (ZOFRAN ODT) 4 MG disintegrating tablet Take 1 tablet (4 mg total) by mouth every 8 (eight) hours as needed for nausea or vomiting. 07/07/20   Saguier, [name], PA-C  OXYGEN Inhale 2-3 sprays into the lungs every 4 (four) hours as needed (for shortness of breath). Patient not taking: No sig reported    [provider]  potassium chloride SA (KLOR-CON) 20 MEQ tablet Take 20 mEq  by mouth daily with breakfast.    [provider]  TURMERIC PO Take 1 tablet by mouth daily.    [provider]    Family History    Family History  Problem Relation Age of Onset  . CAD Neg Hx    He indicated that the status of his neg hx is unknown.  Social History    Social History   Socioeconomic History  . Marital status: Divorced    Spouse name: Not on file  . Number of children: Not on file  .  Years of education: Not on file  . Highest education level: Not on file  Occupational History  . Not on file  Tobacco Use  . Smoking status: Former Smoker    Packs/day: 3.00    Years: 47.00    Pack years: 141.00    Types: Cigarettes, Cigars    Start date: 1964    Quit date: 07/28/2010    Years since quitting: 10.3  . Smokeless tobacco: Never Used  Substance and Sexual Activity  . Alcohol use: No  . Drug use: No  . Sexual activity: Not on file  Other Topics Concern  . Not on file  Social History Narrative  . Not on file   Social Determinants of Health   Financial Resource Strain: Not on file  Food Insecurity: Not on file  Transportation Needs: Not on file  Physical Activity: Not on file  Stress: Not on file  Social Connections: Not on file  Intimate Partner Violence: Not on file     Review of Systems    General:  No chills, fever, night sweats or weight changes.  Cardiovascular:  No chest pain, dyspnea on exertion, edema, orthopnea, palpitations, paroxysmal nocturnal dyspnea. Dermatological: No rash, lesions/masses Respiratory: No cough, dyspnea Urologic: No hematuria, dysuria Abdominal:   No nausea, vomiting, diarrhea, bright red blood per rectum, melena, or hematemesis Neurologic:  No visual changes, wkns, changes in mental status. All other systems reviewed and are otherwise negative except as noted above.  Physical Exam    VS:  BP 136/82   Pulse 61   Ht 6\' 2"  (1.88 m)   Wt 171 lb (77.6 kg)   SpO2 99%   BMI 21.96 kg/m  , BMI Body  mass index is 21.96 kg/m. GEN: Well nourished, well developed, in no acute distress. HEENT: normal. Neck: Supple, no JVD, carotid bruits, or masses. Cardiac: RRR, no murmurs, rubs, or gallops. No clubbing, cyanosis, edema.  Radials/DP/PT 2+ and equal bilaterally.  Respiratory:  Respirations regular and unlabored, clear to auscultation bilaterally. GI: Soft, nontender, nondistended, BS + x 4. MS: no deformity or atrophy. Skin: warm and dry, no rash. Neuro:  Strength and sensation are intact. Psych: Normal affect.  Accessory Clinical Findings    Recent Labs: 05/13/2020: TSH 1.178 08/08/2020: B Natriuretic Peptide 1,288.7 08/09/2020: Magnesium 1.8 08/24/2020: Hemoglobin 12.5; Platelets 174.0 11/24/2020: ALT 10; BUN 20; Creatinine, Ser 1.00; Potassium 4.7; Pro B Natriuretic peptide (BNP) 572.0; Sodium 138   Recent Lipid Panel No results found for: CHOL, TRIG, HDL, CHOLHDL, VLDL, LDLCALC, LDLDIRECT  ECG personally reviewed by me today-none today.  EKG 08/03/2020 atrial fibrillation with slow ventricular response incomplete left bundle branch block ST and T wave abnormality consider inferolateral ischemia 50 bpm  EKG 06/01/2020 Atrial fibrillation PVCs, borderline right axis deviation with nonspecific T wave abnormality, borderline prolonged QT of 479.  Echocardiogram 05/12/2020 IMPRESSIONS    1. Left ventricular ejection fraction, by estimation, is 55 to 60%. The  left ventricle has normal function. The left ventricle has no regional  wall motion abnormalities. There is moderate concentric left ventricular  hypertrophy. Left ventricular  diastolic parameters are indeterminate.  2. Right ventricular systolic function is normal. The right ventricular  size is normal. There is moderately elevated pulmonary artery systolic  pressure.  3. Left atrial size was severely dilated.  4. Right atrial size was severely dilated.  5. The mitral valve is normal in structure. Mild mitral  valve  regurgitation. No evidence of mitral stenosis.  6. There is  thickening of the septal leaflet. The tricuspid valve is  abnormal.  7. Perivalvular thickening around the bioprosthetic aortic valve. The  valve appears to be stable and there is no rocking motion. In the setting  of bacteremia concern for endocarditis/abscess. TEE recommended.. The  aortic valve has been  repaired/replaced. Aortic valve regurgitation is not visualized. No aortic  stenosis is present. There is a unknown bioprosthetic tilting disk valve  present in the aortic position. Procedure Date: 2015. Echo findings are  consistent with normal structure  and function of the aortic valve prosthesis. Aortic valve area, by VTI  measures 1.39 cm. Aortic valve mean gradient measures 10.0 mmHg. Aortic  valve Vmax measures 2.16 m/s.  8. Aortic dilatation noted. There is mild dilatation of the ascending  aorta measuring 38 mm.  9. The inferior vena cava is dilated in size with >50% respiratory  variability, suggesting right atrial pressure of 8 mmHg.   Assessment & Plan   1.  Chronic diastolic heart failure/mitral regurgitation-no increased DOE or activity tolerance.  Generalized bilateral lower extremity nonpitting edema. Weight today 171 pounds.   Hospitalization 08/08/2020 weight on admission 164.5 pounds in weight on discharge 143 pounds.  On follow-up weight loss 170 pounds in the office and his diuresis was increased.Echocardiogram 05/12/2020 reviewed in the hospital during last admission. He was felt to not be a candidate for MitraClip due to his restricted leaflets. Medical management was recommended. Continuefurosemide, carvedilol, potassium Heart healthy low-sodium diet-salty 6 reviewed Increase physical activity as tolerated Lower extremity support stockings-continue Contact office with a weight increase of 3 pounds overnight or 5 pounds in 1 week Continue fluid restriction 48-64 ounces   Essential  hypertension-BP today 136/82. Well-controlled at home.  Daughter contacted office for carvedilol refill.  He has not taken his carvedilol yet today. Continuecarvedilol, losartan Heart healthy low-sodium diet-salty 6 given-reviewed Increase physical activity as tolerated  Atrial fibrillation-heart rate today 62. No increased shortness of breath or DOE.  Denies bleeding issues. Continuecarvedilol, Eliquis Heart healthy low-sodium diet-salty 6 given Increase physical activity as tolerated  Disposition: Follow-up with Dr. Royann Shivers in 6 months.   Thomasene Ripple. Kalid Ghan NP-C    12/07/2020, 8:59 AM [facility] [address] Office [phone] Fax [phone]  Notice: This dictation was prepared with Dragon dictation along with smaller phrase technology. Any transcriptional errors that result from this process are unintentional and may not be corrected upon review.  I spent 12 minutes examining this patient, reviewing medications, and using patient centered shared decision making involving her cardiac care.  Prior to her visit I spent greater than 20 minutes reviewing her past medical history,  medications, and prior cardiac tests.

## 2020-12-07 ENCOUNTER — Ambulatory Visit (INDEPENDENT_AMBULATORY_CARE_PROVIDER_SITE_OTHER): Payer: Medicare Other | Admitting: General Practice

## 2020-12-07 ENCOUNTER — Other Ambulatory Visit: Payer: Self-pay

## 2020-12-07 ENCOUNTER — Encounter: Payer: Self-pay | Admitting: General Practice

## 2020-12-07 VITALS — BP 136/82 | HR 61 | Ht 74.0 in | Wt 171.0 lb

## 2020-12-07 DIAGNOSIS — I4819 Other persistent atrial fibrillation: Secondary | ICD-10-CM | POA: Diagnosis not present

## 2020-12-07 DIAGNOSIS — I10 Essential (primary) hypertension: Secondary | ICD-10-CM | POA: Diagnosis not present

## 2020-12-07 DIAGNOSIS — I5032 Chronic diastolic (congestive) heart failure: Secondary | ICD-10-CM

## 2020-12-07 NOTE — Patient Instructions (Signed)
Medication Instructions:  The current medical regimen is effective;  continue present plan and medications as directed. Please refer to the Current Medication list given to you today.  *If you need a refill on your cardiac medications before your next appointment, please call your pharmacy*  Lab Work:   Testing/Procedures:  NONE    NONE  Special Instructions TAKE AND LOG YOUR BLOOD PRESSURE AT LEAST ONCE A WEEK  PLEASE READ AND FOLLOW SALTY 6-ATTACHED-1,800mg  daily  PLEASE INCREASE PHYSICAL ACTIVITY AS TOLERATED  Follow-Up: Your next appointment:  6 month(s) In Person with Thurmon Fair, MD OR IF UNAVAILABLE JESSE CLEAVER, FNP-C   Please call our office 2 months in advance to schedule this appointment   At [facility], you and your health needs are our priority.  As part of our continuing mission to provide you with exceptional heart care, we have created designated Provider Care Teams.  These Care Teams include your primary Cardiologist (physician) and Advanced Practice Providers (APPs -  Physician Assistants and Nurse Practitioners) who all work together to provide you with the care you need, when you need it.            6 SALTY THINGS TO AVOID     1,800MG  DAILY

## 2020-12-10 ENCOUNTER — Emergency Department (HOSPITAL_BASED_OUTPATIENT_CLINIC_OR_DEPARTMENT_OTHER): Payer: Medicare Other

## 2020-12-10 ENCOUNTER — Emergency Department (HOSPITAL_BASED_OUTPATIENT_CLINIC_OR_DEPARTMENT_OTHER)
Admission: EM | Admit: 2020-12-10 | Discharge: 2020-12-10 | Disposition: A | Discharge status: Home / Self Care | Payer: Medicare Other | Attending: Emergency Medicine | Admitting: Emergency Medicine

## 2020-12-10 ENCOUNTER — Encounter (HOSPITAL_BASED_OUTPATIENT_CLINIC_OR_DEPARTMENT_OTHER): Payer: Self-pay

## 2020-12-10 ENCOUNTER — Other Ambulatory Visit: Payer: Self-pay

## 2020-12-10 DIAGNOSIS — R609 Edema, unspecified: Secondary | ICD-10-CM | POA: Insufficient documentation

## 2020-12-10 DIAGNOSIS — Z79899 Other long term (current) drug therapy: Secondary | ICD-10-CM | POA: Diagnosis not present

## 2020-12-10 DIAGNOSIS — Z87891 Personal history of nicotine dependence: Secondary | ICD-10-CM | POA: Diagnosis not present

## 2020-12-10 DIAGNOSIS — I5042 Chronic combined systolic (congestive) and diastolic (congestive) heart failure: Secondary | ICD-10-CM | POA: Diagnosis not present

## 2020-12-10 DIAGNOSIS — R0602 Shortness of breath: Secondary | ICD-10-CM | POA: Insufficient documentation

## 2020-12-10 DIAGNOSIS — I110 Hypertensive heart disease with heart failure: Secondary | ICD-10-CM | POA: Insufficient documentation

## 2020-12-10 DIAGNOSIS — Z951 Presence of aortocoronary bypass graft: Secondary | ICD-10-CM | POA: Diagnosis not present

## 2020-12-10 DIAGNOSIS — Z91010 Allergy to peanuts: Secondary | ICD-10-CM | POA: Diagnosis not present

## 2020-12-10 DIAGNOSIS — Z7901 Long term (current) use of anticoagulants: Secondary | ICD-10-CM | POA: Insufficient documentation

## 2020-12-10 DIAGNOSIS — L02212 Cutaneous abscess of back [any part, except buttock]: Secondary | ICD-10-CM | POA: Insufficient documentation

## 2020-12-10 DIAGNOSIS — R21 Rash and other nonspecific skin eruption: Secondary | ICD-10-CM | POA: Diagnosis not present

## 2020-12-10 DIAGNOSIS — I25810 Atherosclerosis of coronary artery bypass graft(s) without angina pectoris: Secondary | ICD-10-CM | POA: Insufficient documentation

## 2020-12-10 LAB — CBC WITH DIFFERENTIAL/PLATELET
Abs Immature Granulocytes: 0.01 10*3/uL (ref 0.00–0.07)
Basophils Absolute: 0.1 10*3/uL (ref 0.0–0.1)
Basophils Relative: 1 %
Eosinophils Absolute: 0.2 10*3/uL (ref 0.0–0.5)
Eosinophils Relative: 4 %
HCT: 43.3 % (ref 39.0–52.0)
Hemoglobin: 13.5 g/dL (ref 13.0–17.0)
Immature Granulocytes: 0 %
Lymphocytes Relative: 12 %
Lymphs Abs: 0.5 10*3/uL — ABNORMAL LOW (ref 0.7–4.0)
MCH: 27.4 pg (ref 26.0–34.0)
MCHC: 31.2 g/dL (ref 30.0–36.0)
MCV: 88 fL (ref 80.0–100.0)
Monocytes Absolute: 0.4 10*3/uL (ref 0.1–1.0)
Monocytes Relative: 10 %
Neutro Abs: 3 10*3/uL (ref 1.7–7.7)
Neutrophils Relative %: 73 %
Platelets: 137 10*3/uL — ABNORMAL LOW (ref 150–400)
RBC: 4.92 MIL/uL (ref 4.22–5.81)
RDW: 17.1 % — ABNORMAL HIGH (ref 11.5–15.5)
WBC: 4.1 10*3/uL (ref 4.0–10.5)
nRBC: 0 % (ref 0.0–0.2)

## 2020-12-10 LAB — COMPREHENSIVE METABOLIC PANEL
ALT: 17 U/L (ref 0–44)
AST: 37 U/L (ref 15–41)
Albumin: 4.1 g/dL (ref 3.5–5.0)
Alkaline Phosphatase: 151 U/L — ABNORMAL HIGH (ref 38–126)
Anion gap: 9 (ref 5–15)
BUN: 25 mg/dL — ABNORMAL HIGH (ref 8–23)
CO2: 27 mmol/L (ref 22–32)
Calcium: 9.5 mg/dL (ref 8.9–10.3)
Chloride: 103 mmol/L (ref 98–111)
Creatinine, Ser: 0.99 mg/dL (ref 0.61–1.24)
GFR, Estimated: >60 mL/min (ref >60)
Glucose, Bld: 94 mg/dL (ref 70–99)
Potassium: 4.1 mmol/L (ref 3.5–5.1)
Sodium: 139 mmol/L (ref 135–145)
Total Bilirubin: 1.1 mg/dL (ref 0.3–1.2)
Total Protein: 8.4 g/dL — ABNORMAL HIGH (ref 6.5–8.1)

## 2020-12-10 LAB — BRAIN NATRIURETIC PEPTIDE: B Natriuretic Peptide: 273.3 pg/mL — ABNORMAL HIGH (ref 0.0–100.0)

## 2020-12-10 MED ORDER — HYDROCODONE-ACETAMINOPHEN 5-325 MG PO TABS
1.0000 | ORAL_TABLET | Freq: Once | ORAL | Status: AC
  Administered 2020-12-10: 1 via ORAL
  Filled 2020-12-10: qty 1

## 2020-12-10 MED ORDER — LIDOCAINE-EPINEPHRINE (PF) 2 %-1:200000 IJ SOLN
20.0000 mL | Freq: Once | INTRAMUSCULAR | Status: AC
  Administered 2020-12-10: 20 mL
  Filled 2020-12-10: qty 20

## 2020-12-10 MED ORDER — HYDROCORTISONE 2.5 % EX LOTN: TOPICAL_LOTION | Freq: Two times a day (BID) | CUTANEOUS | 0 refills | Status: DC

## 2020-12-10 MED ORDER — HYDROCODONE-ACETAMINOPHEN 5-325 MG PO TABS: 1.0000 | ORAL_TABLET | Freq: Four times a day (QID) | ORAL | 0 refills | Status: DC | PRN

## 2020-12-10 MED ORDER — DOXYCYCLINE HYCLATE 100 MG PO CAPS: 100.0000 mg | ORAL_CAPSULE | Freq: Two times a day (BID) | ORAL | 0 refills | Status: DC

## 2020-12-10 NOTE — Discharge Instructions (Addendum)
You are seen in the emergency department for leg pain and swelling.  You had blood work and an ultrasound of your leg that did not show any abnormalities.  It will be important for you to take your fluid pills as prescribed by your doctors.  We are putting you on a course of antibiotics.  You also had an abscess in your back and some rash.  The wick will need to be removed in 2 days and we are prescribing you some cream for the itchy redness around your chest and back.  Please follow-up with your regular doctor.  Keep your legs elevated.

## 2020-12-10 NOTE — ED Triage Notes (Signed)
Per pt and pt daughter with flaking skin, redness, swelling, sores to bilat LE x 2 years-sores to arms x 1 week-pt concerned that he had recent "shingles shot"-also completed abx for possible PNA x 2 days ago-pt NAD- to triage with own walker

## 2020-12-10 NOTE — ED Provider Notes (Signed)
[facility] EMERGENCY DEPARTMENT Provider Note   CSN: [id number] Arrival date & time: 12/10/20  1314     History Chief Complaint  Patient presents with  . Leg Problem  . Multiple c/o    Carlos Welch is a 75 y.o. male.  He is brought in by his daughter who is helping with his history.  He has a history of CHF chronic edema on Eliquis and diuretics.  Said his weights have been stable.  Complaining of pain in both his lower extremities for over a week and increased redness and weepage.  Still ambulating at baseline.  No fevers.  Baseline shortness of breath.  Just saw cardiology a few days ago and was told everything was okay and no medications were changed.  Daughter states he has not been taking his full doses of diuretics.  He said he urinates all the time.  No chest pain abdominal pain vomiting diarrhea or dysuria.  Also has noticed some itchiness to his upper trunk around his arms and a new fluctuant mass behind his left shoulder.  The history is provided by the patient.  Leg Pain Location:  Leg Time since incident:  1 week Injury: no   Leg location:  L lower leg and R lower leg Pain details:    Quality:  Burning and aching   Severity:  Severe   Onset quality:  Gradual   Timing:  Constant   Progression:  Unchanged Chronicity:  New Relieved by:  None tried Worsened by:  Nothing Ineffective treatments:  None tried Associated symptoms: swelling   Associated symptoms: no fever and no neck pain        Past Medical History:  Diagnosis Date  . A-fib ([place])   . Abscess of jaw 07/19/2020  . Acute on chronic heart failure with preserved ejection fraction ([place]) 07/08/2020  . Aortic stenosis    a. s/p pericardial AVR 2015.  Marland Kitchen Aortic valve stenosis 07/19/2020  . Bacteremia 07/19/2020  . Bacterial endocarditis   . BPH (benign prostatic hyperplasia) 07/08/2020  . CAD (coronary artery disease)    a. s/p CABG, AVR, LAA clipping 2015 at [facility]  . Cellulitis of lower  extremity 07/08/2020  . Chronic atrial fibrillation ([place]) 07/08/2020  . Chronic combined systolic and diastolic CHF (congestive heart failure) ([place])   . Cognitive communication deficit 07/19/2020  . Coronary artery disease involving coronary bypass graft of native heart without angina pectoris 07/19/2020  . Debility 07/19/2020  . 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 Elevated troponin 07/08/2020  . Essential hypertension 07/08/2020  . Generalized abdominal pain   . History of noncompliance with medical treatment   . Hyperlipemia 07/08/2020  . Infection associated with implant ([place]) 07/19/2020  . Malnutrition ([place])   . MRSA bacteremia   . 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 Normocytic anemia 07/08/2020  . Pressure injury of skin 05/12/2020  . 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 Severe malnutrition ([place]) 07/19/2020  . Thrombocytopenia ([place]) 07/08/2020  . TIA (transient ischemic attack)   . Weight loss 07/19/2020    Patient Active Problem List   Diagnosis Date Noted  . Acute exacerbation of CHF (congestive heart failure) ([place]) 08/08/2020  . Abscess of jaw 07/19/2020  . Admission for palliative care 07/19/2020  . Aortic valve stenosis 07/19/2020  . Bacteremia 07/19/2020  . Cognitive communication deficit 07/19/2020  . Coronary artery disease involving coronary bypass graft of native heart without  angina pectoris 07/19/2020  . Debility 07/19/2020  . Infection associated with implant ([place]) 07/19/2020  . 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 Severe malnutrition ([place]) 07/19/2020  . TIA (transient ischemic attack) 07/19/2020  . Weight loss 07/19/2020  . Acute on chronic heart failure with preserved ejection fraction ([place]) 07/08/2020  . BPH (benign prostatic hyperplasia) 07/08/2020  . Cellulitis of lower extremity 07/08/2020  . 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 Elevated troponin 07/08/2020  . Essential hypertension 07/08/2020  . Hyperlipemia 07/08/2020  . Normocytic anemia 07/08/2020  . Thrombocytopenia ([place]) 07/08/2020  . Chronic atrial fibrillation ([place]) 07/08/2020  . Pressure injury of skin 05/12/2020  . Generalized abdominal pain     Past Surgical History:  Procedure Laterality Date  . AORTIC VALVE REPLACEMENT    . CORONARY ARTERY BYPASS GRAFT  2015  . HERNIA REPAIR    . REMOVAL OF IMPLANT  03/14/2020  . TEE WITHOUT CARDIOVERSION N/A 05/13/2020   Procedure: TRANSESOPHAGEAL ECHOCARDIOGRAM (TEE);  Surgeon: Lelon Perla, MD;  Location: [facility] ENDOSCOPY;  Service: Cardiovascular;  Laterality: N/A;       Family History  Problem Relation Age of Onset  . CAD Neg Hx     Social History   Tobacco Use  . Smoking status: Former Smoker    Packs/day: 3.00    Years: 47.00    Pack years: 141.00    Types: Cigarettes, Cigars    Start date: 1964    Quit date: 07/28/2010    Years since quitting: 10.3  . Smokeless tobacco: Never Used  Substance Use Topics  . Alcohol use: No  . Drug use: No    Home Medications Prior to Admission medications   Medication Sig Start Date End Date Taking? Authorizing Provider  apixaban (ELIQUIS) 5 MG TABS tablet Take 1 tablet (5 mg total) by mouth in the morning and at bedtime. 11/09/20   Croitoru, Mihai, MD  carvedilol (COREG) 6.25 MG tablet Take 1 tablet (6.25 mg total) by mouth 2 (two) times daily with a meal. 12/06/20   Croitoru, Mihai, MD  doxycycline (VIBRA-TABS) 100 MG tablet Take 1 tablet (100 mg total) by mouth 2 (two) times daily. 11/24/20   Saguier, Percell Miller, PA-C  furosemide (LASIX) 40 MG tablet Take 2 tablets (80 mg total) by mouth daily. 09/02/20 12/31/20  Croitoru, Mihai, MD  Ginkgo Biloba 40 MG TABS Take 1 tablet by mouth daily.    [provider]  HEMP OIL-VANILLYL BUTYL ETHER EX Apply 1 application topically daily.    [provider]  hydrALAZINE (APRESOLINE) 50 MG tablet Take  1 tablet (50 mg total) by mouth every 8 (eight) hours. 09/02/20 12/31/20  Croitoru, Mihai, MD  HYDROcodone-acetaminophen (NORCO) 5-325 MG tablet Take 0.5 tablets by mouth every 4 (four) hours as needed (for pain). 08/07/20   Molpus, John, MD  ipratropium (ATROVENT) 0.02 % nebulizer solution Take 2.5 mLs (0.5 mg total) by nebulization in the morning, at noon, and at bedtime. 07/28/20   Hunsucker, Bonna Gains, MD  losartan (COZAAR) 100 MG tablet Take 1 tablet (100 mg total) by mouth daily. 06/09/20   Saguier, Percell Miller, PA-C  Multiple Vitamin (MULTIVITAMIN WITH MINERALS) TABS tablet Take 1 tablet by mouth daily.    [provider]  ondansetron (ZOFRAN ODT) 4 MG disintegrating tablet Take 1 tablet (4 mg total) by mouth every 8 (eight) hours as needed for nausea or vomiting. 07/07/20   Saguier, Percell Miller, PA-C  OXYGEN Inhale 2-3 sprays  into the lungs every 4 (four) hours as needed (for shortness of breath).    [provider]  potassium chloride SA (KLOR-CON) 20 MEQ tablet Take 20 mEq by mouth daily with breakfast.    [provider]  TURMERIC PO Take 1 tablet by mouth daily.    [provider]    Allergies    Chocolate, Chocolate flavor, Peanut oil, and Peanut-containing drug products  Review of Systems   Review of Systems  Constitutional: Negative for fever.  HENT: Negative for sore throat.   Eyes: Negative for visual disturbance.  Respiratory: Positive for shortness of breath.   Cardiovascular: Positive for leg swelling. Negative for chest pain.  Gastrointestinal: Negative for abdominal pain.  Genitourinary: Negative for dysuria.  Musculoskeletal: Negative for neck pain.  Skin: Positive for color change, rash and wound.  Neurological: Negative for headaches.    Physical Exam Updated Vital Signs BP (!) 175/96 (BP Location: Left Arm)   Pulse (!) 58   Temp 97.7 F (36.5 C) (Oral)   Resp 18   Ht _0  (1.88 m)   Wt 77.6 kg   SpO2 100%   BMI 21.96 kg/m    Physical Exam Vitals and nursing note reviewed.  Constitutional:      Appearance: Normal appearance. He is well-developed and well-nourished.  HENT:     Head: Normocephalic and atraumatic.  Eyes:     Conjunctiva/sclera: Conjunctivae normal.  Cardiovascular:     Rate and Rhythm: Normal rate and regular rhythm.     Pulses: Normal pulses.     Heart sounds: No murmur heard.   Pulmonary:     Effort: Pulmonary effort is normal. No respiratory distress.     Breath sounds: Normal breath sounds.  Abdominal:     Palpations: Abdomen is soft.     Tenderness: There is no abdominal tenderness.  Musculoskeletal:     Cervical back: Neck supple.     Right lower leg: Edema present.     Left lower leg: Edema present.     Comments: Bilateral lower extremities with erythema and venous stasis changes with some weeping but no particular warmth.  DP pulses intact.  Fungal infection toenails.  Skin:    General: Skin is warm and dry.     Comments: He has some slight redness to his skin in front of and posterior to his axilla but not inside the axilla.  Itchy.  Question scratching trauma  Is also a 3 cm soft tissue mass posterior left shoulder with no overlying erythema.  Tender.  Neurological:     General: No focal deficit present.     Mental Status: He is alert.  Psychiatric:        Mood and Affect: Mood and affect normal.       Right leg upper image, left leg lower image  ED Results / Procedures / Treatments   Labs (all labs ordered are listed, but only abnormal results are displayed) Labs Reviewed  COMPREHENSIVE METABOLIC PANEL - Abnormal; Notable for the following components:      Result Value   BUN 25 (*)    Total Protein 8.4 (*)    Alkaline Phosphatase 151 (*)    All other components within normal limits  CBC WITH DIFFERENTIAL/PLATELET - Abnormal; Notable for the following components:   RDW 17.1 (*)    Platelets 137 (*)    Lymphs Abs 0.5 (*)    All other components within  normal limits  BRAIN NATRIURETIC PEPTIDE -  Abnormal; Notable for the following components:   B Natriuretic Peptide 273.3 (*)    All other components within normal limits  AEROBIC CULTURE W GRAM STAIN (SUPERFICIAL SPECIMEN)  CBC WITH DIFFERENTIAL/PLATELET    EKG EKG Interpretation  Date/Time:  Friday December 10 2020 15:55:32 EST Ventricular Rate:  58 PR Interval:    QRS Duration: 105 QT Interval:  449 QTC Calculation: 441 R Axis:   81 Text Interpretation: Atrial fibrillation Borderline right axis deviation Borderline repolarization abnormality No significant change since prior 10/21 Confirmed by Aletta Edouard [phone]) on 12/10/2020 3:57:39 PM   Radiology US Venous Img Lower Bilateral  Result Date: 12/10/2020 CLINICAL DATA:  Edema, shortness of breath EXAM: BILATERAL LOWER EXTREMITY VENOUS DOPPLER ULTRASOUND TECHNIQUE: Gray-scale sonography with compression, as well as color and duplex ultrasound, were performed to evaluate the deep venous system(s) from the level of the common femoral vein through the popliteal and proximal calf veins. COMPARISON:  None. FINDINGS: VENOUS Normal compressibility of the common femoral, superficial femoral, and popliteal veins, as well as the visualized calf veins. Visualized portions of profunda femoral vein and great saphenous vein unremarkable. No filling defects to suggest DVT on grayscale or color Doppler imaging. Doppler waveforms show normal direction of venous flow, normal respiratory phasicity and response to augmentation. OTHER Bilateral subcutaneous ankle edema. Limitations: none IMPRESSION: No femoropopliteal DVT nor evidence of DVT within the visualized calf veins. If clinical symptoms are inconsistent or if there are persistent or worsening symptoms, further imaging (possibly involving the iliac veins) may be warranted. Electronically Signed   By: Lucrezia Europe M.D.   On: 12/10/2020 16:41   DG Chest Port 1 View  Result Date: 12/10/2020 CLINICAL  DATA:  Shortness of breath EXAM: PORTABLE CHEST 1 VIEW COMPARISON:  November 24, 2020 FINDINGS: There is scarring in the right base with loculated pleural effusion at the right base. Lungs elsewhere are clear. There is cardiomegaly with pulmonary vascularity normal. Patient is status post coronary artery bypass grafting. There is aortic atherosclerosis. No demonstrable adenopathy. No bone lesions. IMPRESSION: Scarring right base with loculated right pleural effusion, stable. No edema or consolidation. No new opacity. Stable cardiomegaly postoperative change. Aortic Atherosclerosis (ICD10-I70.0). Electronically Signed   By: Lowella Grip III M.D.   On: 12/10/2020 16:12    Procedures Ultrasound ED Soft Tissue  Date/Time: 12/10/2020 4:11 PM Performed by: Hayden Rasmussen, MD Authorized by: Hayden Rasmussen, MD   Procedure details:    Indications: localization of abscess     Transverse view:  Visualized   Longitudinal view:  Visualized   Images: archived   Location:    Location: upper back     Side:  Left Findings:     abscess present    no foreign body present  .Marland KitchenIncision and Drainage  Date/Time: 12/11/2020 9:56 AM Performed by: Hayden Rasmussen, MD Authorized by: Hayden Rasmussen, MD   Consent:    Consent obtained:  Verbal   Consent given by:  Patient   Risks discussed:  Bleeding, incomplete drainage, pain and infection   Alternatives discussed:  No treatment, delayed treatment and referral Universal protocol:    Patient identity confirmed:  Verbally with patient Location:    Type:  Abscess   Size:  3   Location:  Trunk   Trunk location:  Back Pre-procedure details:    Skin preparation:  Povidone-iodine Sedation:    Sedation type:  None Anesthesia:    Anesthesia method:  Local infiltration   Local anesthetic:  Lidocaine 2% w/o epi Procedure type:    Complexity:  Complex Procedure details:    Ultrasound guidance: yes     Incision types:  Single straight   Wound  management:  Probed and deloculated   Drainage:  Purulent   Drainage amount:  Moderate   Packing materials:  1/4 in iodoform gauze   Amount 1/4" iodoform:  10 Post-procedure details:    Procedure completion:  Tolerated well, no immediate complications     Medications Ordered in ED Medications  HYDROcodone-acetaminophen (NORCO/VICODIN) 5-325 MG per tablet 1 tablet (1 tablet Oral Given 12/10/20 1553)  lidocaine-EPINEPHrine (XYLOCAINE W/EPI) 2 %-1:200000 (PF) injection 20 mL (20 mLs Infiltration Given 12/10/20 1643)  HYDROcodone-acetaminophen (NORCO/VICODIN) 5-325 MG per tablet 1 tablet (1 tablet Oral Given 12/10/20 1837)    ED Course  I have reviewed the triage vital signs and the nursing notes.  Pertinent labs & imaging results that were available during my care of the patient were reviewed by me and considered in my medical decision making (see chart for details).  Clinical Course as of 12/10/20 1618  Fri Dec 10, 2020  1615 Chest x-ray showing scarring and loculated pleural effusion right unchanged from priors.  Interpreted by me. [MB]    Clinical Course User Index [MB] Hayden Rasmussen, MD   MDM Rules/Calculators/A&P                         This patient complains of swelling pain and redness of his lower extremities, itchy rash on his chest and back, abscess on his back; this involves an extensive number of treatment Options and is a complaint that carries with it a high risk of complications and Morbidity. The differential includes peripheral edema, cellulitis, DVT, arterial occlusion  I ordered, reviewed and interpreted labs, which included CBC with normal white count normal hemoglobin, platelets slightly low normal chemistries and LFTs elevated alk phos seen prior, BNP mildly elevated at 273 but better than baseline I ordered medication oral pain medicine I ordered imaging studies which included chest x-ray and duplex bilateral lower extremities and I independently     visualized and interpreted imaging which showed no acute findings Additional history obtained from patient's daughter Previous records obtained and reviewed in epic including recent PCP and cardiology visits  After the interventions stated above, I reevaluated the patient and found patient still to be having pain in his legs.  No indication of vascular compromise.  Pain is likely due to his edema.  His daughter states he is not very compliant with taking his diuretics.  Will cover with antibiotics for possible cellulitis although not, no fever, no elevated white count.  Recommended close follow-up with his PCP.  Return instructions discussed.  CHA2DS2/VAS Stroke Risk Points  Current as of 8 minutes ago     6 >= 2 Points: High Risk  1 - 1.99 Points: Medium Risk  0 Points: Low Risk    Last Change:       Details    This score determines the patient's risk of having a stroke if the  patient has atrial fibrillation.       Points Metrics  1 Has Congestive Heart Failure:  Yes    Current as of 8 minutes ago  1 Has Vascular Disease:  Yes    Current as of 8 minutes ago  1 Has Hypertension:  Yes    Current as of 8 minutes ago  1 Age:  76  Current as of 8 minutes ago  0 Has Diabetes:  No    Current as of 8 minutes ago  2 Had Stroke:  No  Had TIA:  Yes  Had Thromboembolism:  No    Current as of 8 minutes ago  0 Male:  No    Current as of 8 minutes ago           Final Clinical Impression(s) / ED Diagnoses Final diagnoses:  Peripheral edema  Abscess of back  Rash of back    Rx / DC Orders ED Discharge Orders         Ordered    HYDROcodone-acetaminophen (NORCO/VICODIN) 5-325 MG tablet  Every 6 hours PRN        12/10/20 1815    doxycycline (VIBRAMYCIN) 100 MG capsule  2 times daily        12/10/20 1815    hydrocortisone 2.5 % lotion  2 times daily        12/10/20 1815           Hayden Rasmussen, MD 12/11/20 1000

## 2020-12-10 NOTE — ED Notes (Signed)
ED Provider at bedside. 

## 2020-12-10 NOTE — ED Notes (Signed)
Pt placed on auto VS and continuous pulse ox.

## 2020-12-14 LAB — AEROBIC CULTURE W GRAM STAIN (SUPERFICIAL SPECIMEN): Gram Stain: NONE SEEN

## 2020-12-17 ENCOUNTER — Ambulatory Visit (INDEPENDENT_AMBULATORY_CARE_PROVIDER_SITE_OTHER): Payer: Medicare Other | Admitting: Medical

## 2020-12-17 ENCOUNTER — Other Ambulatory Visit: Payer: Self-pay

## 2020-12-17 VITALS — BP 149/96 | HR 55 | Temp 98.2°F | Resp 20 | Ht 74.0 in | Wt 169.8 lb

## 2020-12-17 DIAGNOSIS — L03119 Cellulitis of unspecified part of limb: Secondary | ICD-10-CM | POA: Diagnosis not present

## 2020-12-17 DIAGNOSIS — L089 Local infection of the skin and subcutaneous tissue, unspecified: Secondary | ICD-10-CM | POA: Diagnosis not present

## 2020-12-17 DIAGNOSIS — I482 Chronic atrial fibrillation, unspecified: Secondary | ICD-10-CM

## 2020-12-17 DIAGNOSIS — I509 Heart failure, unspecified: Secondary | ICD-10-CM

## 2020-12-17 MED ORDER — SULFAMETHOXAZOLE-TRIMETHOPRIM 800-160 MG PO TABS
1.0000 | ORAL_TABLET | Freq: Two times a day (BID) | ORAL | 0 refills | Status: DC
Start: 2020-12-17 — End: 2020-12-23

## 2020-12-17 MED ORDER — MUPIROCIN 2 % EX OINT: 1.0000 "application " | TOPICAL_OINTMENT | Freq: Two times a day (BID) | CUTANEOUS | 2 refills | Status: DC

## 2020-12-17 NOTE — Patient Instructions (Addendum)
History of CHF and atrial fibrillation.  Clinically stable presently.  Work-up reviewed done in ED recently.  Medication compliance has been an issue and counseled patient regarding taking his diuretics daily as instructed.  He expresses understanding.  Left upper back region abscess.  Recent I&D done in the emergency department.  Culture came back showing MRSA.  Showed resistance to tetracycline.  Patient is on doxycycline.  Prescription of Bactrim DS sent advised to pharmacy and discontinued doxy.  Prescription of mupirocin to use early on for skin lesions/potential early skin infection sites in the future.  Advised can go ahead and put intranasally twice daily for the next week.  Recent leg pain with no DVT by ultrasound.  Emergency department gave patient Norco.  Patient is requesting more for pain.  Explained would give 8  tablet prescription and I will have plans to refill past this prescription.  Rx advisement given.  Did counsel patient could use Ace wraps to both lower legs to decrease the swelling.  But leave open to air late in afternoon and evening.  If any open wounds develop or blisters develop then might consider wound care treatments/Unna boots.  Follow-up in 7 to 10 days or as needed.

## 2020-12-17 NOTE — Progress Notes (Signed)
Subjective:    Patient ID: Carlos Welch, male    DOB: 02-21-1946, 75 y.o.   MRN: [id number]  HPI  Pt in with some recent rash on his back with  abcess that needed to be drained and also for lower ext small blisters and swelling.    Below hpi from ED on 12-10-2020. "Carlos Welch is a 75 y.o. male.  He is brought in by his daughter who is helping with his history.  He has a history of CHF chronic edema on Eliquis and diuretics.  Said his weights have been stable.  Complaining of pain in both his lower extremities for over a week and increased redness and weepage.  Still ambulating at baseline.  No fevers.  Baseline shortness of breath.  Just saw cardiology a few days ago and was told everything was okay and no medications were changed.  Daughter states he has not been taking his full doses of diuretics.  He said he urinates all the time.  No chest pain abdominal pain vomiting diarrhea or dysuria.  Also has noticed some itchiness to his upper trunk around his arms and a new fluctuant mass behind his left shoulder."  ED Physical exam. "Musculoskeletal:     Cervical back: Neck supple.     Right lower leg: Edema present.     Left lower leg: Edema present.     Comments: Bilateral lower extremities with erythema and venous stasis changes with some weeping but no particular warmth.  DP pulses intact.  Fungal infection toenails.  Skin:    General: Skin is warm and dry.     Comments: He has some slight redness to his skin in front of and posterior to his axilla but not inside the axilla.  Itchy.  Question scratching trauma  Is also a 3 cm soft tissue mass posterior left shoulder with no overlying erythema.  Tender. "    ED A/P "This patient complains of swelling pain and redness of his lower extremities, itchy rash on his chest and back, abscess on his back; this involves an extensive number of treatment Options and is a complaint that carries with it a high risk of complications and Morbidity. The  differential includes peripheral edema, cellulitis, DVT, arterial occlusion  I ordered, reviewed and interpreted labs, which included CBC with normal white count normal hemoglobin, platelets slightly low normal chemistries and LFTs elevated alk phos seen prior, BNP mildly elevated at 273 but better than baseline I ordered medication oral pain medicine I ordered imaging studies which included chest x-ray and duplex bilateral lower extremities and I independently    visualized and interpreted imaging which showed no acute findings Additional history obtained from patient's daughter Previous records obtained and reviewed in epic including recent PCP and cardiology visits  After the interventions stated above, I reevaluated the patient and found patient still to be having pain in his legs.  No indication of vascular compromise.  Pain is likely due to his edema.  His daughter states he is not very compliant with taking his diuretics.  Will cover with antibiotics for possible cellulitis although not, no fever, no elevated white count.  Recommended close follow-up with his PCP.  Return instructions discussed."   No chest pain, no sob or pedal edema. Since Friday he has been compliant on taking diuretics. But previous to cardiology appt he was non complaint a lot of the time.   Review of Systems  Constitutional: Negative for chills, fatigue and fever.  Respiratory: Negative  for chest tightness, shortness of breath and wheezing.   Cardiovascular: Negative for chest pain and palpitations.  Gastrointestinal: Negative for abdominal pain.  Genitourinary: Negative for dysuria, enuresis and frequency.  Musculoskeletal:       Lower leg pain.  Skin: Negative for rash.  Neurological: Negative for dizziness, speech difficulty, weakness, numbness and headaches.  Hematological: Negative for adenopathy. Does not bruise/bleed easily.  Psychiatric/Behavioral: Negative for confusion, dysphoric mood and suicidal  ideas. The patient is not nervous/anxious.     Past Medical History:  Diagnosis Date  . A-fib ([place])   . Abscess of jaw 07/19/2020  . Acute on chronic heart failure with preserved ejection fraction ([place]) 07/08/2020  . Aortic stenosis    a. s/p pericardial AVR 2015.  Marland Kitchen Aortic valve stenosis 07/19/2020  . Bacteremia 07/19/2020  . Bacterial endocarditis   . BPH (benign prostatic hyperplasia) 07/08/2020  . CAD (coronary artery disease)    a. s/p CABG, AVR, LAA clipping 2015 at [facility].  . Cellulitis of lower extremity 07/08/2020  . Chronic atrial fibrillation ([place]) 07/08/2020  . Chronic combined systolic and diastolic CHF (congestive heart failure) ([place])   . Cognitive communication deficit 07/19/2020  . Coronary artery disease involving coronary bypass graft of native heart without angina pectoris 07/19/2020  . Debility 07/19/2020  . 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 Elevated troponin 07/08/2020  . Essential hypertension 07/08/2020  . Generalized abdominal pain   . History of noncompliance with medical treatment   . Hyperlipemia 07/08/2020  . Infection associated with implant ([place]) 07/19/2020  . Malnutrition ([place])   . MRSA bacteremia   . 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 Normocytic anemia 07/08/2020  . Pressure injury of skin 05/12/2020  . 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 Severe malnutrition ([place]) 07/19/2020  . Thrombocytopenia ([place]) 07/08/2020  . TIA (transient ischemic attack)   . Weight loss 07/19/2020     Social History   Socioeconomic History  . Marital status: Divorced    Spouse name: Not on file  . Number of children: Not on file  . Years of education: Not on file  . Highest education level: Not on file  Occupational History  . Not on file  Tobacco Use  . Smoking status: Former Smoker    Packs/day: 3.00    Years: 47.00    Pack years: 141.00    Types: Cigarettes, Cigars    Start  date: 1964    Quit date: 07/28/2010    Years since quitting: 10.3  . Smokeless tobacco: Never Used  Substance and Sexual Activity  . Alcohol use: No  . Drug use: No  . Sexual activity: Not on file  Other Topics Concern  . Not on file  Social History Narrative  . Not on file   Social Determinants of Health   Financial Resource Strain: Not on file  Food Insecurity: Not on file  Transportation Needs: Not on file  Physical Activity: Not on file  Stress: Not on file  Social Connections: Not on file  Intimate Partner Violence: Not on file    Past Surgical History:  Procedure Laterality Date  . AORTIC VALVE REPLACEMENT    . CORONARY ARTERY BYPASS GRAFT  2015  . HERNIA REPAIR    . REMOVAL OF IMPLANT  03/14/2020  . TEE WITHOUT CARDIOVERSION N/A 05/13/2020   Procedure: TRANSESOPHAGEAL ECHOCARDIOGRAM (TEE);  Surgeon: Lelon Perla, MD;  Location: [facility] ENDOSCOPY;  Service:  Cardiovascular;  Laterality: N/A;    Family History  Problem Relation Age of Onset  . CAD Neg Hx     Allergies  Allergen Reactions  . Chocolate Rash  . Chocolate Flavor Rash  . Peanut Oil Rash  . Peanut-Containing Drug Products     Current Outpatient Medications on File Prior to Visit  Medication Sig Dispense Refill  . apixaban (ELIQUIS) 5 MG TABS tablet Take 1 tablet (5 mg total) by mouth in the morning and at bedtime. 60 tablet 6  . carvedilol (COREG) 6.25 MG tablet Take 1 tablet (6.25 mg total) by mouth 2 (two) times daily with a meal. 60 tablet 3  . furosemide (LASIX) 40 MG tablet Take 2 tablets (80 mg total) by mouth daily. 60 tablet 3  . Ginkgo Biloba 40 MG TABS Take 1 tablet by mouth daily.    Marland Kitchen HEMP OIL-VANILLYL BUTYL ETHER EX Apply 1 application topically daily.    . hydrALAZINE (APRESOLINE) 50 MG tablet Take 1 tablet (50 mg total) by mouth every 8 (eight) hours. 90 tablet 3  . HYDROcodone-acetaminophen (NORCO/VICODIN) 5-325 MG tablet Take 1 tablet by mouth every 6 (six) hours as needed for  severe pain. 10 tablet 0  . hydrocortisone 2.5 % lotion Apply topically 2 (two) times daily. To rash on trunk 59 mL 0  . ipratropium (ATROVENT) 0.02 % nebulizer solution Take 2.5 mLs (0.5 mg total) by nebulization in the morning, at noon, and at bedtime. 240 mL 11  . losartan (COZAAR) 100 MG tablet Take 1 tablet (100 mg total) by mouth daily. 90 tablet 3  . Multiple Vitamin (MULTIVITAMIN WITH MINERALS) TABS tablet Take 1 tablet by mouth daily.    . ondansetron (ZOFRAN ODT) 4 MG disintegrating tablet Take 1 tablet (4 mg total) by mouth every 8 (eight) hours as needed for nausea or vomiting. 20 tablet 0  . OXYGEN Inhale 2-3 sprays into the lungs every 4 (four) hours as needed (for shortness of breath).    . potassium chloride SA (KLOR-CON) 20 MEQ tablet Take 20 mEq by mouth daily with breakfast.    . TURMERIC PO Take 1 tablet by mouth daily.     No current facility-administered medications on file prior to visit.    BP (!) 149/96   Pulse (!) 55   Temp 98.2 F (36.8 C)   Resp 20   Ht $R'6\' 2"'TM$  (1.88 m)   Wt 169 lb 12.8 oz (77 kg)   SpO2 100%   BMI 21.80 kg/m       Objective:   Physical Exam  General- No acute distress. Pleasant patient. Neck- Full range of motion, no jvd Lungs- Clear, even and unlabored. Heart- regular rate and rhythm. Neurologic- CNII- XII grossly intact.  Skin-left upper back near scapula area has approximately 2.5cm mild red area that minimally raised and indurated.  Not warm to touch.  Does have gauze packing of present.  Little bit of dried yellowish discharge at incision of abscess site.  Lower extremity-1+ pedal pedal edema at best.  Mild faint pinkish redness to both legs and faint warmth to palpation.  Patient reports some pain on palpation.  Negative Homans' sign bilaterally.  Left pretibial area has some scattered scabs present.  No yellow discharge seen.  Right pretibial area couple small scabs.      Assessment & Plan:  History of CHF and atrial  fibrillation.  Clinically stable presently.  Work-up reviewed done in ED recently.  Medication compliance has been an  issue and counseled patient regarding taking his diuretics daily as instructed.  He expresses understanding.  Left upper back region abscess.  Recent I&D done in the emergency department.  Culture came back showing MRSA.  Showed resistance to tetracycline.  Patient is on doxycycline.  Prescription of Bactrim DS sent advised to pharmacy and discontinued doxy.  Prescription of mupirocin to use early on for skin lesions/potential early skin infection sites in the future.  Advised can go ahead and put intranasally twice daily for the next week.  Recent leg pain with no DVT by ultrasound.  Emergency department gave patient Norco.  Patient is requesting more for pain.  Explained would give 8  tablet prescription and I will have plans to refill past this prescription.  Rx advisement given.  Did counsel patient could use Ace wraps to both lower legs to decrease the swelling.  But leave open to air late in afternoon and evening.  If any open wounds develop or blisters develop then might consider wound care treatments/Unna boots.  Follow-up in 7 to 10 days or as needed.  Mackie Pai, PA-C

## 2020-12-23 ENCOUNTER — Other Ambulatory Visit: Payer: Self-pay

## 2020-12-23 ENCOUNTER — Other Ambulatory Visit: Payer: Self-pay | Admitting: Cardiovascular Disease

## 2020-12-23 ENCOUNTER — Ambulatory Visit (INDEPENDENT_AMBULATORY_CARE_PROVIDER_SITE_OTHER): Payer: Medicare Other | Admitting: Internal Medicine

## 2020-12-23 VITALS — BP 169/96 | HR 69 | Temp 97.5°F | Ht 74.0 in | Wt 173.0 lb

## 2020-12-23 DIAGNOSIS — L02212 Cutaneous abscess of back [any part, except buttock]: Secondary | ICD-10-CM | POA: Diagnosis not present

## 2020-12-23 DIAGNOSIS — I10 Essential (primary) hypertension: Secondary | ICD-10-CM

## 2020-12-23 DIAGNOSIS — Z22322 Carrier or suspected carrier of Methicillin resistant Staphylococcus aureus: Secondary | ICD-10-CM | POA: Diagnosis not present

## 2020-12-23 DIAGNOSIS — I872 Venous insufficiency (chronic) (peripheral): Secondary | ICD-10-CM

## 2020-12-23 MED ORDER — HYDROCODONE-ACETAMINOPHEN 5-325 MG PO TABS: 1.0000 | ORAL_TABLET | Freq: Four times a day (QID) | ORAL | 0 refills | Status: DC | PRN

## 2020-12-23 MED ORDER — SULFAMETHOXAZOLE-TRIMETHOPRIM 800-160 MG PO TABS: 1.0000 | ORAL_TABLET | Freq: Two times a day (BID) | ORAL | 0 refills | Status: DC

## 2020-12-23 MED ORDER — TRIAMCINOLONE ACETONIDE 0.1 % EX LOTN: 1.0000 "application " | TOPICAL_LOTION | Freq: Three times a day (TID) | CUTANEOUS | 0 refills | Status: DC

## 2020-12-23 NOTE — Patient Instructions (Addendum)
Schedule a visit with [name] in 4 to 5 days  Take the antibiotic Bactrim for a total of 15 days  For the wound on the back: Apply warm compress twice daily Change dressings twice daily Keep the area clean and dry If you develop fever, chills, redness around the abscess: Call or go to the ER  Use the Kenalog lotion on the arms as needed for itching.

## 2020-12-23 NOTE — Progress Notes (Signed)
Subjective:    Patient ID: Carlos Welch, male    DOB: [birth year], 75 y.o.   MRN: [id number]  DOS:  12/23/2020 Type of visit - description: Acute visit, here with his daughter  Was seen at the ER 12/10/2020 for redness of the lower extremities, itchy rash and lower extremity edema. He also had an abscess on his back, s/p A&D (MRSA). Rx doxy WBC were  normal, chest x-ray and ultrasound of the legs nonacute.  Subsequently was seen at this office by PCP, they noted to be stable, compliance of medication was a issue. D/c doxy, started bactrim  They are here because the patient remains concerned about the rash at his back.  On further questioning, he has a rash "all over" for more than 2 years, during the ER visit the rash on the back was worse.  The daughter reports today that the rash on the back actually looks better.  The abscess has not changed much.   BP Readings from Last 3 Encounters:  12/23/20 (!) 169/96  12/17/20 (!) 149/96  12/10/20 (!) 170/103    Review of Systems No fever chills No lip swelling, tongue swelling or wheezing.  Past Medical History:  Diagnosis Date  . A-fib ([facility])   . Abscess of jaw 07/19/2020  . Acute on chronic heart failure with preserved ejection fraction ([facility]) 07/08/2020  . Aortic stenosis    a. s/p pericardial AVR 2015.  Marland Kitchen Aortic valve stenosis 07/19/2020  . Bacteremia 07/19/2020  . Bacterial endocarditis   . BPH (benign prostatic hyperplasia) 07/08/2020  . CAD (coronary artery disease)    a. s/p CABG, AVR, LAA clipping 2015 at [facility].  . Cellulitis of lower extremity 07/08/2020  . Chronic atrial fibrillation ([facility]) 07/08/2020  . Chronic combined systolic and diastolic CHF (congestive heart failure) ([facility])   . Cognitive communication deficit 07/19/2020  . Coronary artery disease involving coronary bypass graft of native heart without angina pectoris 07/19/2020  . Debility 07/19/2020  . Edema of both lower legs due to peripheral venous  insufficiency 07/08/2020  . Elevated troponin 07/08/2020  . Essential hypertension 07/08/2020  . Generalized abdominal pain   . History of noncompliance with medical treatment   . Hyperlipemia 07/08/2020  . 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  . Malnutrition ([facility])   . MRSA bacteremia   . 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 Normocytic anemia 07/08/2020  . Pressure injury of skin 05/12/2020  . 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 Severe malnutrition ([facility]) 07/19/2020  . Thrombocytopenia ([facility]) 07/08/2020  . TIA (transient ischemic attack)   . Weight loss 07/19/2020    Past Surgical History:  Procedure Laterality Date  . AORTIC VALVE REPLACEMENT    . CORONARY ARTERY BYPASS GRAFT  2015  . HERNIA REPAIR    . REMOVAL OF IMPLANT  03/14/2020  . TEE WITHOUT CARDIOVERSION N/A 05/13/2020   Procedure: TRANSESOPHAGEAL ECHOCARDIOGRAM (TEE);  Surgeon: Lewayne Bunting, MD;  Location: [facility] ENDOSCOPY;  Service: Cardiovascular;  Laterality: N/A;    Allergies as of 12/23/2020      Reactions   Chocolate Rash   Chocolate Flavor Rash   Peanut Oil Rash   Peanut-containing Drug Products       Medication List       Accurate as of December 23, 2020 11:59 PM. If you have any questions, ask your nurse or doctor.        apixaban 5 MG Tabs tablet Commonly known as: Eliquis  Take 1 tablet (5 mg total) by mouth in the morning and at bedtime.   carvedilol 6.25 MG tablet Commonly known as: COREG Take 1 tablet (6.25 mg total) by mouth 2 (two) times daily with a meal.   furosemide 40 MG tablet Commonly known as: LASIX TAKE 2 TABLETS BY MOUTH EVERY DAY   Ginkgo Biloba 40 MG Tabs Take 1 tablet by mouth daily.   HEMP OIL-VANILLYL BUTYL ETHER EX Apply 1 application topically daily.   hydrALAZINE 50 MG tablet Commonly known as: APRESOLINE TAKE 1 TABLET BY MOUTH EVERY 8 HOURS.   HYDROcodone-acetaminophen 5-325 MG tablet Commonly known as:  NORCO/VICODIN Take 1 tablet by mouth every 6 (six) hours as needed for severe pain.   hydrocortisone 2.5 % lotion Apply topically 2 (two) times daily. To rash on trunk   ipratropium 0.02 % nebulizer solution Commonly known as: ATROVENT Take 2.5 mLs (0.5 mg total) by nebulization in the morning, at noon, and at bedtime.   losartan 100 MG tablet Commonly known as: COZAAR Take 1 tablet (100 mg total) by mouth daily.   multivitamin with minerals Tabs tablet Take 1 tablet by mouth daily.   mupirocin ointment 2 % Commonly known as: BACTROBAN Apply 1 application topically 2 (two) times daily.   ondansetron 4 MG disintegrating tablet Commonly known as: Zofran ODT Take 1 tablet (4 mg total) by mouth every 8 (eight) hours as needed for nausea or vomiting.   OXYGEN Inhale 2-3 sprays into the lungs every 4 (four) hours as needed (for shortness of breath).   potassium chloride SA 20 MEQ tablet Commonly known as: KLOR-CON Take 20 mEq by mouth daily with breakfast.   sulfamethoxazole-trimethoprim 800-160 MG tablet Commonly known as: BACTRIM DS Take 1 tablet by mouth 2 (two) times daily.   triamcinolone lotion 0.1 % Commonly known as: KENALOG Apply 1 application topically 3 (three) times daily. Started by: Willow Ora, MD   TURMERIC PO Take 1 tablet by mouth daily.          Objective:   Physical Exam BP (!) 169/96 (BP Location: Right Arm, Patient Position: Sitting, Cuff Size: Large)   Pulse 69   Temp (!) 97.5 F (36.4 C) (Oral)   Ht 6\' 2"  (1.88 m)   Wt 173 lb (78.5 kg)   SpO2 100%   BMI 22.21 kg/m  General:   Well developed, NAD, BMI noted. HEENT:  Normocephalic . Face symmetric, atraumatic Skin: See pictures, no petechia. He does have abscess at the upper back, it was somewhat indurated. Procedure  note: With the patient verbal consent I remove the covers of the upper back abscess, I noticed a approximately 1 1/2 of packing material entheses removed). The wound  remained indurated, I introduce a curved mosquito, looking for additional packing but none was found.  I did see some fatty tissue and cystic wall material. The patient had some pain during the procedure, small amount of serous fluid noted.  No frank bleeding. Neurologic:  alert & oriented X3.  Speech normal, gait appropriate for age and unassisted Psych--  Cognition and judgment appear intact.  Cooperative with normal attention span and concentration.  Behavior appropriate. No anxious or depressed appearing.            Assessment     75 year old gentleman, history of CHF, aortic stenosis, CAD, A. fib, anticoagulated, presents with  MRSA abscess, upper back. I&D @ the ER done 12/10/20, the area still look indurated, see physical exam, a small piece of  packing removed, I explore the of the abscess and did not find more packing rather cystic material. Plan:  Was prescribed Bactrim on 12/17/2020 for 10 days, will add additional 5 days.  Warm compress, definitely call or go back to the ER if fever, chills, increased swelling See PCP in 4 to 5 days. Call if fever chills Rash: Going on for 2 years, chronic changes noted at the legs (stasis dermatitis), + evidence of a scratching at the upper extremities.  According to the patient's daughter the areas look better. Consider dermatology referral Help with Kenalog HTN: Reports good compliance with Carvedilol, losartan, potassium, Lasix, hydralazine.  No change for now, recheck on RTC. stasis dermatitis: Extensive redressing done Pain medication refill, PDMP okay. RTC 4 to 5 days PCP  Time is spent with the patient and the daughter 45 minutes, reviewing the chart, exploring the abscess on the upper back.  Multiple questions answered for the patient and daughter. This visit occurred during the SARS-CoV-2 public health emergency.  Safety protocols were in place, including screening questions prior to the visit, additional usage of staff PPE,  and extensive cleaning of exam room while observing appropriate contact time as indicated for disinfecting solutions.

## 2020-12-26 ENCOUNTER — Telehealth: Payer: Self-pay | Admitting: Medical

## 2020-12-26 NOTE — Telephone Encounter (Signed)
Please have pt follow up with me on Tuesday for recent abscess. Dr. Drue Novel saw pt

## 2020-12-27 ENCOUNTER — Ambulatory Visit: Payer: Medicare Other | Admitting: Medical

## 2020-12-27 NOTE — Telephone Encounter (Signed)
Pt scheduled follow up with front desk after prior visit

## 2020-12-31 ENCOUNTER — Encounter: Payer: Self-pay | Admitting: Medical

## 2020-12-31 ENCOUNTER — Ambulatory Visit: Payer: Medicare Other | Admitting: Medical

## 2021-01-05 ENCOUNTER — Encounter: Payer: Self-pay | Admitting: Medical

## 2021-01-05 ENCOUNTER — Ambulatory Visit (INDEPENDENT_AMBULATORY_CARE_PROVIDER_SITE_OTHER): Payer: Medicare Other | Admitting: Medical

## 2021-01-05 ENCOUNTER — Other Ambulatory Visit: Payer: Self-pay

## 2021-01-05 VITALS — BP 140/80 | HR 63 | Resp 20 | Ht 74.0 in | Wt 169.4 lb

## 2021-01-05 DIAGNOSIS — G629 Polyneuropathy, unspecified: Secondary | ICD-10-CM | POA: Diagnosis not present

## 2021-01-05 DIAGNOSIS — I872 Venous insufficiency (chronic) (peripheral): Secondary | ICD-10-CM | POA: Diagnosis not present

## 2021-01-05 DIAGNOSIS — L02212 Cutaneous abscess of back [any part, except buttock]: Secondary | ICD-10-CM | POA: Diagnosis not present

## 2021-01-05 DIAGNOSIS — I10 Essential (primary) hypertension: Secondary | ICD-10-CM | POA: Diagnosis not present

## 2021-01-05 MED ORDER — GABAPENTIN 100 MG PO CAPS: 100.0000 mg | ORAL_CAPSULE | Freq: Every day | ORAL | 0 refills | Status: DC

## 2021-01-05 NOTE — Patient Instructions (Addendum)
Your blood pressure is controlled today. Continue losartan and lasix.  Abscess is now healed left upper back. Now done with antibiotic.  Lower ext pain that appears more neuropathic sharp pain at night. Will rx gabapentin 100 mg at night.  Follow up in one month or as needed

## 2021-01-05 NOTE — Progress Notes (Signed)
Subjective:    Patient ID: Carlos Welch, male    DOB: [birth year], 75 y.o.   MRN: [id number]  HPI  Pt in for follow up for skin infection/abscess. Pt saw Dr. Drue Novel after seen by myself and  ED. Below is the notes from last visit.  "Type of visit - description: Acute visit, here with his daughter  Was seen at the ER 12/10/2020 for redness of the lower extremities, itchy rash and lower extremity edema. He also had an abscess on his back, s/p A&D (MRSA). Rx doxy WBC were  normal, chest x-ray and ultrasound of the legs nonacute.  Subsequently was seen at this office by PCP, they noted to be stable, compliance of medication was a issue. D/c doxy, started bactrim  They are here because the patient remains concerned about the rash at his back.  On further questioning, he has a rash "all over" for more than 2 years, during the ER visit the rash on the back was worse.  The daughter reports today that the rash on the back actually looks better.  The abscess has not changed much."  Since restarting  bactrim antibiotic the area did heal, flatten out and no further dc.  Also rt pretibial broken down area has filled in and healed.  Hx of low leg pain and was on norco in past. We have discussed stopping. Pt still has pain and occure at night. Pain more nerve like. Sharp, needle tingling feeling at night.    Review of Systems  Constitutional: Negative for chills, fatigue and fever.  HENT: Negative for congestion and drooling.   Respiratory: Negative for cough, chest tightness, shortness of breath and wheezing.   Cardiovascular: Negative for chest pain and palpitations.  Gastrointestinal: Negative for abdominal pain, blood in stool, diarrhea, nausea and rectal pain.  Genitourinary: Negative for dysuria and frequency.  Musculoskeletal: Negative for back pain and myalgias.  Skin: Negative for rash.  Neurological: Negative for dizziness and headaches.  Hematological: Negative for  adenopathy. Does not bruise/bleed easily.  Psychiatric/Behavioral: Negative for behavioral problems and decreased concentration.     Past Medical History:  Diagnosis Date  . A-fib (HCC)   . Abscess of jaw 07/19/2020  . Acute on chronic heart failure with preserved ejection fraction (HCC) 07/08/2020  . Aortic stenosis    a. s/p pericardial AVR 2015.  Marland Kitchen Aortic valve stenosis 07/19/2020  . Bacteremia 07/19/2020  . Bacterial endocarditis   . BPH (benign prostatic hyperplasia) 07/08/2020  . CAD (coronary artery disease)    a. s/p CABG, AVR, LAA clipping 2015 at [facility].  . Cellulitis of lower extremity 07/08/2020  . Chronic atrial fibrillation (HCC) 07/08/2020  . Chronic combined systolic and diastolic CHF (congestive heart failure) (HCC)   . Cognitive communication deficit 07/19/2020  . Coronary artery disease involving coronary bypass graft of native heart without angina pectoris 07/19/2020  . Debility 07/19/2020  . 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 Elevated troponin 07/08/2020  . Essential hypertension 07/08/2020  . Generalized abdominal pain   . History of noncompliance with medical treatment   . Hyperlipemia 07/08/2020  . 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  . Malnutrition (HCC)   . MRSA bacteremia   . 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 Normocytic anemia 07/08/2020  . Pressure injury of skin 05/12/2020  . 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 Severe malnutrition (HCC) 07/19/2020  . Thrombocytopenia (HCC) 07/08/2020  .  TIA (transient ischemic attack)   . Weight loss 07/19/2020     Social History   Socioeconomic History  . Marital status: Divorced    Spouse name: Not on file  . Number of children: Not on file  . Years of education: Not on file  . Highest education level: Not on file  Occupational History  . Not on file  Tobacco Use  . Smoking status: Former Smoker    Packs/day:  3.00    Years: 47.00    Pack years: 141.00    Types: Cigarettes, Cigars    Start date: 1964    Quit date: 07/28/2010    Years since quitting: 10.4  . Smokeless tobacco: Never Used  Substance and Sexual Activity  . Alcohol use: No  . Drug use: No  . Sexual activity: Not on file  Other Topics Concern  . Not on file  Social History Narrative  . Not on file   Social Determinants of Health   Financial Resource Strain: Not on file  Food Insecurity: Not on file  Transportation Needs: Not on file  Physical Activity: Not on file  Stress: Not on file  Social Connections: Not on file  Intimate Partner Violence: Not on file    Past Surgical History:  Procedure Laterality Date  . AORTIC VALVE REPLACEMENT    . CORONARY ARTERY BYPASS GRAFT  2015  . HERNIA REPAIR    . REMOVAL OF IMPLANT  03/14/2020  . TEE WITHOUT CARDIOVERSION N/A 05/13/2020   Procedure: TRANSESOPHAGEAL ECHOCARDIOGRAM (TEE);  Surgeon: Lewayne Bunting, MD;  Location: [facility] ENDOSCOPY;  Service: Cardiovascular;  Laterality: N/A;    Family History  Problem Relation Age of Onset  . CAD Neg Hx     Allergies  Allergen Reactions  . Chocolate Rash  . Chocolate Flavor Rash  . Peanut Oil Rash  . Peanut-Containing Drug Products     Current Outpatient Medications on File Prior to Visit  Medication Sig Dispense Refill  . apixaban (ELIQUIS) 5 MG TABS tablet Take 1 tablet (5 mg total) by mouth in the morning and at bedtime. 60 tablet 6  . carvedilol (COREG) 6.25 MG tablet Take 1 tablet (6.25 mg total) by mouth 2 (two) times daily with a meal. 60 tablet 3  . furosemide (LASIX) 40 MG tablet TAKE 2 TABLETS BY MOUTH EVERY DAY 60 tablet 3  . Ginkgo Biloba 40 MG TABS Take 1 tablet by mouth daily.    Marland Kitchen HEMP OIL-VANILLYL BUTYL ETHER EX Apply 1 application topically daily.    . hydrALAZINE (APRESOLINE) 50 MG tablet TAKE 1 TABLET BY MOUTH EVERY 8 HOURS. 90 tablet 3  . HYDROcodone-acetaminophen (NORCO/VICODIN) 5-325 MG tablet Take 1  tablet by mouth every 6 (six) hours as needed for severe pain. 12 tablet 0  . hydrocortisone 2.5 % lotion Apply topically 2 (two) times daily. To rash on trunk 59 mL 0  . ipratropium (ATROVENT) 0.02 % nebulizer solution Take 2.5 mLs (0.5 mg total) by nebulization in the morning, at noon, and at bedtime. 240 mL 11  . losartan (COZAAR) 100 MG tablet Take 1 tablet (100 mg total) by mouth daily. 90 tablet 3  . Multiple Vitamin (MULTIVITAMIN WITH MINERALS) TABS tablet Take 1 tablet by mouth daily.    . mupirocin ointment (BACTROBAN) 2 % Apply 1 application topically 2 (two) times daily. 22 g 2  . ondansetron (ZOFRAN ODT) 4 MG disintegrating tablet Take 1 tablet (4 mg total) by mouth every 8 (eight) hours as  needed for nausea or vomiting. 20 tablet 0  . OXYGEN Inhale 2-3 sprays into the lungs every 4 (four) hours as needed (for shortness of breath).    . potassium chloride SA (KLOR-CON) 20 MEQ tablet Take 20 mEq by mouth daily with breakfast.    . sulfamethoxazole-trimethoprim (BACTRIM DS) 800-160 MG tablet Take 1 tablet by mouth 2 (two) times daily. 10 tablet 0  . triamcinolone lotion (KENALOG) 0.1 % Apply 1 application topically 3 (three) times daily. 120 mL 0  . TURMERIC PO Take 1 tablet by mouth daily.     No current facility-administered medications on file prior to visit.    BP (!) 146/97   Pulse 63   Resp 20   Ht 6\' 2"  (1.88 m)   Wt 169 lb 6.4 oz (76.8 kg)   SpO2 99%   BMI 21.75 kg/m       Objective:   Physical Exam   General- No acute distress. Pleasant patient. Neck- Full range of motion, no jvd Lungs- Clear, even and unlabored. Heart- regular rate and rhythm. Neurologic- CNII- XII grossly intact.  Back- left upper scapula abscess area now flat. No redness, no warmth, no induration. No dc.   Rt lower ext- pretibial area prior broken down area is now healed.  Lower ext- no pedal edema. No calf swelling. Negative homans sign.   Assessment & Plan:  Your blood pressure is  controlled today. Continue losartan and lasix.  Abscess is now healed left upper back. Now done with antibiotic.  Lower ext pain that appears more neuropathic sharp pain at night. Will rx gabapentin 100 mg at night.  Follow up in one month or as needed

## 2021-01-06 ENCOUNTER — Other Ambulatory Visit: Payer: Self-pay

## 2021-01-06 ENCOUNTER — Encounter: Payer: Self-pay | Admitting: Medical

## 2021-01-06 ENCOUNTER — Other Ambulatory Visit (HOSPITAL_COMMUNITY): Payer: Self-pay | Admitting: Medical

## 2021-01-06 MED ORDER — LOSARTAN POTASSIUM 100 MG PO TABS: 100.0000 mg | ORAL_TABLET | Freq: Every day | ORAL | 3 refills | Status: DC

## 2021-01-06 MED FILL — LOSARTAN POTASSIUM 100 MG T: 100 | 90 days supply | Qty: 90 | Fill #0

## 2021-01-07 ENCOUNTER — Other Ambulatory Visit: Payer: Self-pay

## 2021-01-07 ENCOUNTER — Other Ambulatory Visit: Payer: Medicare Other | Admitting: Student

## 2021-01-27 ENCOUNTER — Telehealth: Payer: Self-pay | Admitting: Medical

## 2021-01-27 NOTE — Telephone Encounter (Signed)
Caller: [facility]  Call back [phone]  Need form (order number [id number]) fax to [phone]

## 2021-01-27 NOTE — Telephone Encounter (Signed)
Form faxed to cathy

## 2021-02-07 ENCOUNTER — Ambulatory Visit: Payer: Medicare Other | Admitting: Medical

## 2021-02-07 DIAGNOSIS — Z0289 Encounter for other administrative examinations: Secondary | ICD-10-CM

## 2021-02-11 ENCOUNTER — Ambulatory Visit: Payer: Medicare Other | Admitting: Cardiovascular Disease

## 2021-02-21 ENCOUNTER — Other Ambulatory Visit: Payer: Self-pay | Admitting: Cardiovascular Disease

## 2021-02-21 NOTE — Telephone Encounter (Signed)
Rx(s) sent to pharmacy electronically.  

## 2021-05-23 ENCOUNTER — Other Ambulatory Visit: Payer: Self-pay | Admitting: Medical

## 2021-05-24 ENCOUNTER — Telehealth: Payer: Self-pay | Admitting: Medical

## 2021-05-24 MED ORDER — GABAPENTIN 100 MG PO CAPS: 100.0000 mg | ORAL_CAPSULE | Freq: Every day | ORAL | 0 refills | Status: DC

## 2021-05-24 NOTE — Telephone Encounter (Signed)
Rx gabapentin refill. Advise thru staff follow within the month.

## 2021-06-10 ENCOUNTER — Other Ambulatory Visit: Payer: Self-pay

## 2021-06-10 ENCOUNTER — Emergency Department (HOSPITAL_BASED_OUTPATIENT_CLINIC_OR_DEPARTMENT_OTHER)
Admission: EM | Admit: 2021-06-10 | Discharge: 2021-06-10 | Disposition: A | Discharge status: Home / Self Care | Payer: Medicare Other | Attending: Emergency Medicine | Admitting: Emergency Medicine

## 2021-06-10 ENCOUNTER — Encounter (HOSPITAL_BASED_OUTPATIENT_CLINIC_OR_DEPARTMENT_OTHER): Payer: Self-pay

## 2021-06-10 DIAGNOSIS — I872 Venous insufficiency (chronic) (peripheral): Secondary | ICD-10-CM | POA: Diagnosis not present

## 2021-06-10 DIAGNOSIS — Z79899 Other long term (current) drug therapy: Secondary | ICD-10-CM | POA: Insufficient documentation

## 2021-06-10 DIAGNOSIS — I4891 Unspecified atrial fibrillation: Secondary | ICD-10-CM | POA: Diagnosis not present

## 2021-06-10 DIAGNOSIS — Z87891 Personal history of nicotine dependence: Secondary | ICD-10-CM | POA: Insufficient documentation

## 2021-06-10 DIAGNOSIS — I2510 Atherosclerotic heart disease of native coronary artery without angina pectoris: Secondary | ICD-10-CM | POA: Insufficient documentation

## 2021-06-10 DIAGNOSIS — I5042 Chronic combined systolic (congestive) and diastolic (congestive) heart failure: Secondary | ICD-10-CM | POA: Insufficient documentation

## 2021-06-10 DIAGNOSIS — Z7901 Long term (current) use of anticoagulants: Secondary | ICD-10-CM | POA: Diagnosis not present

## 2021-06-10 DIAGNOSIS — R21 Rash and other nonspecific skin eruption: Secondary | ICD-10-CM | POA: Diagnosis present

## 2021-06-10 DIAGNOSIS — Z951 Presence of aortocoronary bypass graft: Secondary | ICD-10-CM | POA: Insufficient documentation

## 2021-06-10 DIAGNOSIS — Z91010 Allergy to peanuts: Secondary | ICD-10-CM | POA: Insufficient documentation

## 2021-06-10 DIAGNOSIS — I110 Hypertensive heart disease with heart failure: Secondary | ICD-10-CM | POA: Diagnosis not present

## 2021-06-10 MED ORDER — LOSARTAN POTASSIUM 25 MG PO TABS
100.0000 mg | ORAL_TABLET | Freq: Once | ORAL | Status: AC
  Administered 2021-06-10: 100 mg via ORAL
  Filled 2021-06-10: qty 4

## 2021-06-10 MED ORDER — HYDROCODONE-ACETAMINOPHEN 5-325 MG PO TABS
1.0000 | ORAL_TABLET | Freq: Once | ORAL | Status: AC
Start: 2021-06-10 — End: 2021-06-10
  Administered 2021-06-10: 1 via ORAL
  Filled 2021-06-10: qty 1

## 2021-06-10 MED ORDER — FUROSEMIDE 40 MG PO TABS
40.0000 mg | ORAL_TABLET | Freq: Once | ORAL | Status: AC
  Administered 2021-06-10: 40 mg via ORAL
  Filled 2021-06-10: qty 1

## 2021-06-10 MED ORDER — CARVEDILOL 6.25 MG PO TABS
6.2500 mg | ORAL_TABLET | Freq: Once | ORAL | Status: AC
  Administered 2021-06-10: 6.25 mg via ORAL
  Filled 2021-06-10: qty 1

## 2021-06-10 MED ORDER — DIPHENHYDRAMINE HCL 25 MG PO CAPS
25.0000 mg | ORAL_CAPSULE | Freq: Once | ORAL | Status: AC
  Administered 2021-06-10: 25 mg via ORAL
  Filled 2021-06-10: qty 1

## 2021-06-10 NOTE — ED Triage Notes (Signed)
Pt c/o scattered rash x 3 months-states he was seen here dx eczema-reports rash is worse and painful-NAD-unsteady gait with own cane

## 2021-06-10 NOTE — ED Provider Notes (Signed)
[facility] EMERGENCY DEPARTMENT Provider Note   CSN: [id number] Arrival date & time: 06/10/21  1609     History Chief Complaint  Patient presents with   Rash    Carlos Welch is a 75 y.o. male with a past medical of combined systolic and diastolic congestive heart failure and venous insufficiency, presenting today with a complaint of a rash.  Patient states that his legs have become very painful, itchy and flaky.  This has been a problem for the last couple of months however over the past few days it has been worse.  Endorses some edema.  Patient says that he is taking his Lasix "when I need it."  for his lower extremity edema.  Tried to take Benadryl last night and states that it helped some but mostly just made him sleepy. He tried Advil for pain and reports that this did not work.  Denies fevers, chills, headaches, visual changes, chest pain or shortness of breath.    Past Medical History:  Diagnosis Date   A-fib (HCC)    Abscess of jaw 07/19/2020   Acute on chronic heart failure with preserved ejection fraction (HCC) 07/08/2020   Aortic stenosis    a. s/p pericardial AVR 2015.   Aortic valve stenosis 07/19/2020   Bacteremia 07/19/2020   Bacterial endocarditis    BPH (benign prostatic hyperplasia) 07/08/2020   CAD (coronary artery disease)    a. s/p CABG, AVR, LAA clipping 2015 at [facility].   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/08/2020   Elevated troponin 07/08/2020   Essential hypertension 07/08/2020   Generalized abdominal pain    History of noncompliance with medical treatment    Hyperlipemia  07/08/2020   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   Malnutrition (HCC)    MRSA bacteremia    Nonrheumatic mitral valve regurgitation 07/19/2020   Normocytic anemia 07/08/2020   Pressure injury of skin 05/12/2020   S/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 2015   Severe malnutrition (HCC) 07/19/2020   Thrombocytopenia (HCC) 07/08/2020   TIA (transient ischemic attack)    Weight loss 07/19/2020    Patient Active Problem List   Diagnosis Date Noted   Acute exacerbation of CHF (congestive heart failure) (HCC) 08/08/2020   Abscess of jaw 07/19/2020   Admission for palliative care 07/19/2020   Aortic valve stenosis 07/19/2020   Bacteremia 07/19/2020   Cognitive communication deficit 07/19/2020   Coronary artery disease involving coronary bypass graft of native heart without angina pectoris 07/19/2020   Debility 07/19/2020   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   Nonrheumatic mitral valve regurgitation 07/19/2020   Severe malnutrition (HCC) 07/19/2020   TIA (transient ischemic attack) 07/19/2020   Weight loss 07/19/2020   Acute on chronic heart failure with preserved ejection fraction (HCC) 07/08/2020   BPH (benign prostatic hyperplasia) 07/08/2020   Cellulitis of lower extremity 07/08/2020   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/08/2020   Elevated troponin 07/08/2020   Essential hypertension 07/08/2020   Hyperlipemia 07/08/2020   Normocytic anemia 07/08/2020   Thrombocytopenia (HCC) 07/08/2020   Chronic atrial fibrillation (HCC) 07/08/2020   Pressure injury  of skin 05/12/2020   Generalized abdominal pain     Past Surgical History:  Procedure Laterality Date   AORTIC VALVE REPLACEMENT     CORONARY ARTERY BYPASS GRAFT  2015   HERNIA REPAIR     REMOVAL OF IMPLANT  03/14/2020   TEE WITHOUT CARDIOVERSION N/A 05/13/2020   Procedure: TRANSESOPHAGEAL ECHOCARDIOGRAM (TEE);  Surgeon: Lewayne Bunting, MD;  Location: [facility]  ENDOSCOPY;  Service: Cardiovascular;  Laterality: N/A;       Family History  Problem Relation Age of Onset   CAD Neg Hx     Social History   Tobacco Use   Smoking status: Former    Packs/day: 3.00    Years: 47.00    Pack years: 141.00    Types: Cigarettes, Cigars    Start date: 1964    Quit date: 07/28/2010    Years since quitting: 10.8   Smokeless tobacco: Never  Vaping Use   Vaping Use: Never used  Substance Use Topics   Alcohol use: No   Drug use: No    Home Medications Prior to Admission medications   Medication Sig Start Date End Date Taking? Authorizing Provider  apixaban (ELIQUIS) 5 MG TABS tablet Take 1 tablet (5 mg total) by mouth in the morning and at bedtime. 11/09/20   Croitoru, Mihai, MD  carvedilol (COREG) 6.25 MG tablet TAKE 1 TABLET BY MOUTH 2 TIMES DAILY WITH A MEAL. 02/21/21   Croitoru, Mihai, MD  furosemide (LASIX) 40 MG tablet TAKE 2 TABLETS BY MOUTH EVERY DAY 12/23/20   Croitoru, Mihai, MD  gabapentin (NEURONTIN) 100 MG capsule Take 1 capsule (100 mg total) by mouth at bedtime. 05/24/21   Saguier, [name], PA-C  Ginkgo Biloba 40 MG TABS Take 1 tablet by mouth daily.    [provider]  HEMP OIL-VANILLYL BUTYL ETHER EX Apply 1 application topically daily.    [provider]  hydrALAZINE (APRESOLINE) 50 MG tablet TAKE 1 TABLET BY MOUTH EVERY 8 HOURS. 12/23/20   Croitoru, Mihai, MD  HYDROcodone-acetaminophen (NORCO/VICODIN) 5-325 MG tablet Take 1 tablet by mouth every 6 (six) hours as needed for severe pain. 12/23/20   Wanda Plump, MD  hydrocortisone 2.5 % lotion Apply topically 2 (two) times daily. To rash on trunk 12/10/20   Terrilee Files, MD  ipratropium (ATROVENT) 0.02 % nebulizer solution Take 2.5 mLs (0.5 mg total) by nebulization in the morning, at noon, and at bedtime. 07/28/20   Hunsucker, Lesia Sago, MD  losartan (COZAAR) 100 MG tablet Take 1 tablet (100 mg total) by mouth daily. 01/06/21   Saguier, [name], PA-C  losartan (COZAAR) 100  MG tablet TAKE 1 TABLET BY MOUTH ONCE DAILY 01/06/21 01/06/22  Saguier, [name], PA-C  Multiple Vitamin (MULTIVITAMIN WITH MINERALS) TABS tablet Take 1 tablet by mouth daily.    [provider]  mupirocin ointment (BACTROBAN) 2 % Apply 1 application topically 2 (two) times daily. 12/17/20   Saguier, [name], PA-C  ondansetron (ZOFRAN ODT) 4 MG disintegrating tablet Take 1 tablet (4 mg total) by mouth every 8 (eight) hours as needed for nausea or vomiting. 07/07/20   Saguier, [name], PA-C  OXYGEN Inhale 2-3 sprays into the lungs every 4 (four) hours as needed (for shortness of breath).    [provider]  potassium chloride SA (KLOR-CON) 20 MEQ tablet Take 20 mEq by mouth daily with breakfast.    [provider]  sulfamethoxazole-trimethoprim (BACTRIM DS) 800-160 MG tablet Take 1 tablet by mouth 2 (two) times daily.  12/23/20   Wanda PlumpPaz, Jose E, MD  triamcinolone lotion (KENALOG) 0.1 % Apply 1 application topically 3 (three) times daily. 12/23/20   Wanda PlumpPaz, Jose E, MD  TURMERIC PO Take 1 tablet by mouth daily.    [provider]    Allergies    Chocolate, Chocolate flavor, Peanut oil, and Peanut-containing drug products  Review of Systems   Review of Systems  Constitutional:  Negative for chills and fever.  Respiratory:  Positive for shortness of breath (On exertion).   Cardiovascular:  Negative for chest pain and palpitations.  Genitourinary:  Negative for dysuria, hematuria and urgency.  Skin:  Positive for rash (On his legs, trunk and inguinal folds).  Neurological:  Negative for dizziness, syncope and weakness.  Psychiatric/Behavioral:  Negative for confusion.   All other systems reviewed and are negative.  Physical Exam Updated Vital Signs BP (!) 193/134 (BP Location: Right Arm)   Pulse 67   Temp 98.2 F (36.8 C) (Oral)   Resp 20   Ht 6\' 2"  (1.88 m)   Wt 77.6 kg   SpO2 100%   BMI 21.96 kg/m   Physical Exam Vitals and nursing note reviewed.  Constitutional:       Appearance: Normal appearance.  HENT:     Head: Normocephalic and atraumatic.  Eyes:     General: No scleral icterus.    Conjunctiva/sclera: Conjunctivae normal.  Cardiovascular:     Rate and Rhythm: Normal rate and regular rhythm.     Pulses: Normal pulses.     Comments: DP pulses confirmed with Doppler. Pulmonary:     Effort: Pulmonary effort is normal. No respiratory distress.  Musculoskeletal:     Right lower leg: Edema present.     Left lower leg: Edema present.     Comments: Bilateral lower extremities with venous stasis changes no warmth or weeping.  Excoriation marks present.  Dried blood on anterior right lower extremity.  DP pulses intact.  All toenails with fungal infection.  Skin:    General: Skin is warm and dry.     Findings: No rash.     Comments: No rash noted to anterior trunk, back or genital area.  Some sparse marks of excoriation.  Neurological:     Mental Status: He is alert.     Sensory: No sensory deficit.     Motor: No weakness.     Coordination: Coordination normal.     Gait: Gait normal.  Psychiatric:        Mood and Affect: Mood normal.    ED Results / Procedures / Treatments   Labs (all labs ordered are listed, but only abnormal results are displayed) Labs Reviewed - No data to display  EKG None  Radiology No results found.  Procedures Procedures   Medications Ordered in ED Medications  HYDROcodone-acetaminophen (NORCO/VICODIN) 5-325 MG per tablet 1 tablet (1 tablet Oral Given 06/10/21 1801)  diphenhydrAMINE (BENADRYL) capsule 25 mg (25 mg Oral Given 06/10/21 1801)  furosemide (LASIX) tablet 40 mg (40 mg Oral Given 06/10/21 1801)  carvedilol (COREG) tablet 6.25 mg (6.25 mg Oral Given 06/10/21 1828)  losartan (COZAAR) tablet 100 mg (100 mg Oral Given 06/10/21 1828)    ED Course  I have reviewed the triage vital signs and the nursing notes.  Pertinent labs & imaging results that were available during my care of the patient were  reviewed by me and considered in my medical decision making (see chart for details).    MDM Rules/Calculators/A&P Usher L Behe  is a 75 y.o. male with a past medical of combined systolic and diastolic congestive heart failure and venous insufficiency, presenting today with a complaint of a rash.  Patient states that his legs have become very painful, itchy and flaky.  He was seen in March for similar symptoms and placed on clindamycin and told to take his Lasix as prescribed.  Today the patient reported that he has not been taking his Lasix regularly.  Also was recommended to follow-up with dermatology and did not make this appointment.  Has not seen his cardiologist for "a very long time."    Physical exam shows signs of venous insufficiency without signs of cellulitis.  There was significant lower extremity pitting edema.  I utilized Doppler to confirm a DP pulse bilaterally.  No concern for ischemic extremities. I decided not to pursue lab work due to no signs of infection on history and PE. Patient reports feeling better after Benadryl and Norco.  When I walked in the room patient was dressed with his cane and reports "I am ready to get out of here."  His blood pressure remained elevated throughout his stay.  I gave him his home medications and a dose of Lasix.  He says that his blood pressure is usually high and that he has not been taking his Lasix as he should.  Says that he will be adherent to the regimen going forward.  No signs of organ compromise.  Denies headaches, chest pain, visual disturbance, shortness of breath or abdominal pain.  Able to ambulate around the department.    Daughter is outside waiting for patient to be discharged.  He is stable for discharge with close follow-up with cardiology and PCP.  He will call cardiology Monday morning.  Agreeable to the plan.  Final Clinical Impression(s) / ED Diagnoses Final diagnoses:  Venous insufficiency of both lower extremities    Rx / DC  Orders Results and diagnoses were explained to the patient. Return precautions discussed in full. Patient had no additional questions and expressed complete understanding.  I did not discharge the patient on pain medication talked about the importance of ibuprofen.  Benadryl may also help him.  Ultimately he needs to continue on his Lasix and care for his venous insufficiency.   Woodroe Chen 06/10/21 1912    Alvira Monday, MD 06/13/21 1040

## 2021-06-10 NOTE — ED Notes (Signed)
Discharge instructions discussed with pt. Pt verbalized understanding. Pt stable and ambulatory.  °

## 2021-06-22 ENCOUNTER — Encounter: Payer: Self-pay | Admitting: Medical

## 2021-06-22 ENCOUNTER — Other Ambulatory Visit: Payer: Self-pay

## 2021-06-22 ENCOUNTER — Ambulatory Visit (INDEPENDENT_AMBULATORY_CARE_PROVIDER_SITE_OTHER): Payer: Medicare Other | Admitting: Medical

## 2021-06-22 ENCOUNTER — Ambulatory Visit (HOSPITAL_BASED_OUTPATIENT_CLINIC_OR_DEPARTMENT_OTHER)
Admission: RE | Admit: 2021-06-22 | Discharge: 2021-06-22 | Disposition: A | Discharge status: Home / Self Care | Payer: Medicare Other | Source: Ambulatory Visit | Attending: Medical | Admitting: Medical

## 2021-06-22 VITALS — BP 150/80 | HR 60 | Temp 98.5°F | Ht 74.0 in | Wt 180.0 lb

## 2021-06-22 DIAGNOSIS — I872 Venous insufficiency (chronic) (peripheral): Secondary | ICD-10-CM | POA: Diagnosis not present

## 2021-06-22 DIAGNOSIS — L853 Xerosis cutis: Secondary | ICD-10-CM

## 2021-06-22 DIAGNOSIS — I618 Other nontraumatic intracerebral hemorrhage: Secondary | ICD-10-CM | POA: Diagnosis not present

## 2021-06-22 DIAGNOSIS — R600 Localized edema: Secondary | ICD-10-CM

## 2021-06-22 DIAGNOSIS — L089 Local infection of the skin and subcutaneous tissue, unspecified: Secondary | ICD-10-CM

## 2021-06-22 DIAGNOSIS — I509 Heart failure, unspecified: Secondary | ICD-10-CM

## 2021-06-22 DIAGNOSIS — I619 Nontraumatic intracerebral hemorrhage, unspecified: Secondary | ICD-10-CM | POA: Diagnosis not present

## 2021-06-22 DIAGNOSIS — I10 Essential (primary) hypertension: Secondary | ICD-10-CM

## 2021-06-22 LAB — COMPREHENSIVE METABOLIC PANEL
ALT: 12 U/L (ref 0–53)
AST: 22 U/L (ref 0–37)
Albumin: 4.1 g/dL (ref 3.5–5.2)
Alkaline Phosphatase: 125 U/L — ABNORMAL HIGH (ref 39–117)
BUN: 18 mg/dL (ref 6–23)
CO2: 31 mEq/L (ref 19–32)
Calcium: 10 mg/dL (ref 8.4–10.5)
Chloride: 99 mEq/L (ref 96–112)
Creatinine, Ser: 1.06 mg/dL (ref 0.40–1.50)
GFR: 69.45 mL/min (ref >60.00)
Glucose, Bld: 69 mg/dL — ABNORMAL LOW (ref 70–99)
Potassium: 4.5 mEq/L (ref 3.5–5.1)
Sodium: 138 mEq/L (ref 135–145)
Total Bilirubin: 1.4 mg/dL — ABNORMAL HIGH (ref 0.2–1.2)
Total Protein: 7.5 g/dL (ref 6.0–8.3)

## 2021-06-22 LAB — BRAIN NATRIURETIC PEPTIDE: Pro B Natriuretic peptide (BNP): 324 pg/mL — ABNORMAL HIGH (ref 0.0–100.0)

## 2021-06-22 MED ORDER — ONDANSETRON 4 MG PO TBDP: 4.0000 mg | ORAL_TABLET | Freq: Three times a day (TID) | ORAL | 0 refills | Status: DC | PRN

## 2021-06-22 MED ORDER — DOXYCYCLINE HYCLATE 100 MG PO TABS: 100.0000 mg | ORAL_TABLET | Freq: Two times a day (BID) | ORAL | 0 refills | Status: DC

## 2021-06-22 MED ORDER — LOSARTAN POTASSIUM 100 MG PO TABS: 100.0000 mg | ORAL_TABLET | Freq: Every day | ORAL | 3 refills | Status: DC

## 2021-06-22 MED ORDER — AMMONIUM LACTATE 12 % EX LOTN: TOPICAL_LOTION | CUTANEOUS | 0 refills | Status: DC

## 2021-06-22 NOTE — Progress Notes (Signed)
Subjective:    Patient ID: Carlos Welch, male    DOB: [birth year], 75 y.o.   MRN: [id number]  HPI  Pt in for some recent  bilateral lower extremity swelling.Pt has chf and he admits skipping doses. He states taking lasix every 2-3 days. He admits when daughter gives him med he will put medication in his pocket. Hx of noncompliance with med use.   Pt has 4-5 small scabs upper abdomen. Scaly dry rash behind knees.  He also has scally dry skin to lower extremity. 4 large scabs to rt pretibial region.  06-13-2021 went to ED.  "Carlos Welch is a 75 y.o. male with a past medical of combined systolic and diastolic congestive heart failure and venous insufficiency, presenting today with a complaint of a rash.  Patient states that his legs have become very painful, itchy and flaky.  He was seen in March for similar symptoms and placed on clindamycin and told to take his Lasix as prescribed.  Today the patient reported that he has not been taking his Lasix regularly.  Also was recommended to follow-up with dermatology and did not make this appointment.  Has not seen his cardiologist for "a very long time."    Physical exam shows signs of venous insufficiency without signs of cellulitis.  There was significant lower extremity pitting edema.  I utilized Doppler to confirm a DP pulse bilaterally.  No concern for ischemic extremities. I decided not to pursue lab work due to no signs of infection on history and PE. Patient reports feeling better after Benadryl and Norco.  When I walked in the room patient was dressed with his cane and reports "I am ready to get out of here."  His blood pressure remained elevated throughout his stay.  I gave him his home medications and a dose of Lasix.  He says that his blood pressure is usually high and that he has not been taking his Lasix as he should.  Says that he will be adherent to the regimen going forward.  No signs of organ compromise.  Denies headaches, chest pain,  visual disturbance, shortness of breath or abdominal pain.  Able to ambulate around the department. "    Review of Systems  Constitutional:  Negative for chills, diaphoresis, fatigue and fever.  HENT:  Negative for dental problem.   Respiratory:  Negative for cough, chest tightness and shortness of breath.   Cardiovascular:  Negative for chest pain and palpitations.  Gastrointestinal:  Negative for abdominal pain.  Musculoskeletal:  Negative for back pain.  Skin:  Positive for rash.       itching  Neurological:  Negative for dizziness, seizures, numbness and headaches.  Hematological:  Negative for adenopathy. Does not bruise/bleed easily.  Psychiatric/Behavioral:  Negative for behavioral problems and decreased concentration.     Past Medical History:  Diagnosis Date   A-fib (HCC)    Abscess of jaw 07/19/2020   Acute on chronic heart failure with preserved ejection fraction (HCC) 07/08/2020   Aortic stenosis    a. s/p pericardial AVR 2015.   Aortic valve stenosis 07/19/2020   Bacteremia 07/19/2020   Bacterial endocarditis    BPH (benign prostatic hyperplasia) 07/08/2020   CAD (coronary artery disease)    a. s/p CABG, AVR, LAA clipping [facility].   Cellulitis of lower extremity 07/08/2020   Chronic atrial fibrillation (HCC) 07/08/2020   Chronic combined systolic and diastolic CHF (congestive heart failure) (HCC)    Cognitive communication  deficit 07/19/2020   Coronary artery disease involving coronary bypass graft of native heart without angina pectoris 07/19/2020   Debility 07/19/2020   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/08/2020   Elevated troponin 07/08/2020   Essential hypertension 07/08/2020   Generalized abdominal pain    History of noncompliance with medical treatment    Hyperlipemia 07/08/2020   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   Malnutrition (HCC)    MRSA bacteremia    Nonrheumatic mitral valve regurgitation 07/19/2020    Normocytic anemia 07/08/2020   Pressure injury of skin 05/12/2020   S/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 2015   Severe malnutrition (HCC) 07/19/2020   Thrombocytopenia (HCC) 07/08/2020   TIA (transient ischemic attack)    Weight loss 07/19/2020     Social History   Socioeconomic History   Marital status: Divorced    Spouse name: Not on file   Number of children: Not on file   Years of education: Not on file   Highest education level: Not on file  Occupational History   Not on file  Tobacco Use   Smoking status: Former    Packs/day: 3.00    Years: 47.00    Pack years: 141.00    Types: Cigarettes, Cigars    Start date: 1964    Quit date: 07/28/2010    Years since quitting: 10.9   Smokeless tobacco: Never  Vaping Use   Vaping Use: Never used  Substance and Sexual Activity   Alcohol use: No   Drug use: No   Sexual activity: Not on file  Other Topics Concern   Not on file  Social History Narrative   Not on file   Social Determinants of Health   Financial Resource Strain: Not on file  Food Insecurity: Not on file  Transportation Needs: Not on file  Physical Activity: Not on file  Stress: Not on file  Social Connections: Not on file  Intimate Partner Violence: Not on file    Past Surgical History:  Procedure Laterality Date   AORTIC VALVE REPLACEMENT     CORONARY ARTERY BYPASS GRAFT  2015   HERNIA REPAIR     REMOVAL OF IMPLANT  03/14/2020   TEE WITHOUT CARDIOVERSION N/A 05/13/2020   Procedure: TRANSESOPHAGEAL ECHOCARDIOGRAM (TEE);  Surgeon: Lewayne Bunting, MD;  Location: [facility] ENDOSCOPY;  Service: Cardiovascular;  Laterality: N/A;    Family History  Problem Relation Age of Onset   CAD Neg Hx     Allergies  Allergen Reactions   Chocolate Rash   Chocolate Flavor Rash   Peanut Oil Rash   Peanut-Containing Drug Products     Current Outpatient Medications on File Prior to Visit  Medication Sig Dispense Refill   apixaban (ELIQUIS) 5 MG TABS  tablet Take 1 tablet (5 mg total) by mouth in the morning and at bedtime. 60 tablet 6   carvedilol (COREG) 6.25 MG tablet TAKE 1 TABLET BY MOUTH 2 TIMES DAILY WITH A MEAL. 180 tablet 2   furosemide (LASIX) 40 MG tablet TAKE 2 TABLETS BY MOUTH EVERY DAY 60 tablet 3   gabapentin (NEURONTIN) 100 MG capsule Take 1 capsule (100 mg total) by mouth at bedtime. 30 capsule 0   Ginkgo Biloba 40 MG TABS Take 1 tablet by mouth daily.     HEMP OIL-VANILLYL BUTYL ETHER EX Apply 1 application topically daily.     hydrALAZINE (APRESOLINE) 50 MG tablet TAKE 1 TABLET BY MOUTH EVERY 8 HOURS. 90 tablet  3   HYDROcodone-acetaminophen (NORCO/VICODIN) 5-325 MG tablet Take 1 tablet by mouth every 6 (six) hours as needed for severe pain. 12 tablet 0   hydrocortisone 2.5 % lotion Apply topically 2 (two) times daily. To rash on trunk 59 mL 0   ipratropium (ATROVENT) 0.02 % nebulizer solution Take 2.5 mLs (0.5 mg total) by nebulization in the morning, at noon, and at bedtime. 240 mL 11   losartan (COZAAR) 100 MG tablet Take 1 tablet (100 mg total) by mouth daily. 90 tablet 3   losartan (COZAAR) 100 MG tablet TAKE 1 TABLET BY MOUTH ONCE DAILY 90 tablet 3   Multiple Vitamin (MULTIVITAMIN WITH MINERALS) TABS tablet Take 1 tablet by mouth daily.     mupirocin ointment (BACTROBAN) 2 % Apply 1 application topically 2 (two) times daily. 22 g 2   ondansetron (ZOFRAN ODT) 4 MG disintegrating tablet Take 1 tablet (4 mg total) by mouth every 8 (eight) hours as needed for nausea or vomiting. 20 tablet 0   OXYGEN Inhale 2-3 sprays into the lungs every 4 (four) hours as needed (for shortness of breath).     potassium chloride SA (KLOR-CON) 20 MEQ tablet Take 20 mEq by mouth daily with breakfast.     sulfamethoxazole-trimethoprim (BACTRIM DS) 800-160 MG tablet Take 1 tablet by mouth 2 (two) times daily. 10 tablet 0   triamcinolone lotion (KENALOG) 0.1 % Apply 1 application topically 3 (three) times daily. 120 mL 0   TURMERIC PO Take 1  tablet by mouth daily.     No current facility-administered medications on file prior to visit.    BP (!) 158/97   Pulse 60   Temp 98.5 F (36.9 C)   Ht 6\' 2"  (1.88 m)   Wt 180 lb (81.6 kg)   SpO2 99%   BMI 23.11 kg/m       Objective:   Physical Exam  General Mental Status- Alert. General Appearance- Not in acute distress.   Skin 4-5 scattered scabs on upper abd. Rt pretibial area 4-5 scabs. Both lower extremity. Moderate bright red and warm.  Very scally dry skin to both distal calfs. Dry scaly skin to both popliteal area.  Neck Carotid Arteries- Normal color. Moisture- Normal Moisture. No carotid bruits. No JVD.  Chest and Lung Exam Auscultation: Breath Sounds:-Normal.  Cardiovascular Auscultation:Rythm- Regular. Murmurs & Other Heart Sounds:Auscultation of the heart reveals- No Murmurs.  Abdomen Inspection:-Inspeection Normal. Palpation/Percussion:Note:No mass. Palpation and Percussion of the abdomen reveal- Non Tender, Non Distended + BS, no rebound or guarding.  Neurologic Cranial Nerve exam:- CN III-XII intact(No nystagmus), symmetric smile. Strength:- 5/5 equal and symmetric strength both upper and lower extremities.   Lower ext- 2+ pedal edema bilaterally. Negative homans sign.     Assessment & Plan:   Patient Instructions  History of CHF.  Noncompliant on Lasix.  We will get chest x-ray, BNP and CMP today.  Do recommend daily diuretic use as  cardiologist recommended/advised.  Might give some advice on adjustment of dosages depending on imaging and lab findings.  Skin infection.  Possible on both lower extremities but more suspicious in the right upper pretibial area near scabbing areas.  Pedal edema as consequence of noncompliance with diuretic use.  Follow labs and imaging studies.  Hypertension history.  BP mildly elevated today.  Refilled losartan.  If you start to use diuretics daily patient's blood pressure should drop to some degree.  Dry skin  distal tibia areas and in area behind both knees.  Prescribe  Lac-Hydrin.  Follow-up in 2 weeks or sooner if needed.    [name], PA-C

## 2021-06-22 NOTE — Patient Instructions (Signed)
History of CHF.  Noncompliant on Lasix.  We will get chest x-ray, BNP and CMP today.  Do recommend daily diuretic use as  cardiologist recommended/advised.  Might give some advice on adjustment of dosages depending on imaging and lab findings.  Skin infection.  Possible on both lower extremities but more suspicious in the right upper pretibial area near scabbing areas.  Pedal edema as consequence of noncompliance with diuretic use.  Follow labs and imaging studies.  Hypertension history.  BP mildly elevated today.  Refilled losartan.  If you start to use diuretics daily patient's blood pressure should drop to some degree.  Dry skin distal tibia areas and in area behind both knees.  Prescribe Lac-Hydrin.  Follow-up in 2 weeks or sooner if needed.

## 2021-06-23 ENCOUNTER — Emergency Department (HOSPITAL_COMMUNITY): Payer: Medicare Other

## 2021-06-23 ENCOUNTER — Inpatient Hospital Stay (HOSPITAL_COMMUNITY): Payer: Medicare Other

## 2021-06-23 ENCOUNTER — Inpatient Hospital Stay (HOSPITAL_COMMUNITY)
Admission: EM | Admit: 2021-06-23 | Discharge: 2021-06-27 | DRG: 064 | Disposition: A | Discharge status: Rehabilitation Facility | Payer: Medicare Other | Attending: Internal Medicine | Admitting: Internal Medicine

## 2021-06-23 DIAGNOSIS — D696 Thrombocytopenia, unspecified: Secondary | ICD-10-CM | POA: Diagnosis present

## 2021-06-23 DIAGNOSIS — Z9119 Patient's noncompliance with other medical treatment and regimen: Secondary | ICD-10-CM

## 2021-06-23 DIAGNOSIS — I878 Other specified disorders of veins: Secondary | ICD-10-CM | POA: Diagnosis present

## 2021-06-23 DIAGNOSIS — I629 Nontraumatic intracranial hemorrhage, unspecified: Secondary | ICD-10-CM

## 2021-06-23 DIAGNOSIS — D6832 Hemorrhagic disorder due to extrinsic circulating anticoagulants: Secondary | ICD-10-CM | POA: Diagnosis present

## 2021-06-23 DIAGNOSIS — D689 Coagulation defect, unspecified: Secondary | ICD-10-CM | POA: Diagnosis present

## 2021-06-23 DIAGNOSIS — F1210 Cannabis abuse, uncomplicated: Secondary | ICD-10-CM | POA: Diagnosis present

## 2021-06-23 DIAGNOSIS — L03115 Cellulitis of right lower limb: Secondary | ICD-10-CM | POA: Diagnosis present

## 2021-06-23 DIAGNOSIS — Z951 Presence of aortocoronary bypass graft: Secondary | ICD-10-CM

## 2021-06-23 DIAGNOSIS — G936 Cerebral edema: Secondary | ICD-10-CM | POA: Diagnosis present

## 2021-06-23 DIAGNOSIS — I4821 Permanent atrial fibrillation: Secondary | ICD-10-CM | POA: Diagnosis present

## 2021-06-23 DIAGNOSIS — I674 Hypertensive encephalopathy: Secondary | ICD-10-CM | POA: Diagnosis present

## 2021-06-23 DIAGNOSIS — I69391 Dysphagia following cerebral infarction: Secondary | ICD-10-CM | POA: Diagnosis not present

## 2021-06-23 DIAGNOSIS — Z8673 Personal history of transient ischemic attack (TIA), and cerebral infarction without residual deficits: Secondary | ICD-10-CM

## 2021-06-23 DIAGNOSIS — L03119 Cellulitis of unspecified part of limb: Secondary | ICD-10-CM | POA: Diagnosis not present

## 2021-06-23 DIAGNOSIS — I10 Essential (primary) hypertension: Secondary | ICD-10-CM | POA: Diagnosis not present

## 2021-06-23 DIAGNOSIS — I6389 Other cerebral infarction: Secondary | ICD-10-CM

## 2021-06-23 DIAGNOSIS — E785 Hyperlipidemia, unspecified: Secondary | ICD-10-CM | POA: Diagnosis present

## 2021-06-23 DIAGNOSIS — E876 Hypokalemia: Secondary | ICD-10-CM | POA: Diagnosis not present

## 2021-06-23 DIAGNOSIS — I2510 Atherosclerotic heart disease of native coronary artery without angina pectoris: Secondary | ICD-10-CM | POA: Diagnosis present

## 2021-06-23 DIAGNOSIS — I69151 Hemiplegia and hemiparesis following nontraumatic intracerebral hemorrhage affecting right dominant side: Secondary | ICD-10-CM | POA: Diagnosis not present

## 2021-06-23 DIAGNOSIS — I161 Hypertensive emergency: Secondary | ICD-10-CM | POA: Diagnosis present

## 2021-06-23 DIAGNOSIS — G8191 Hemiplegia, unspecified affecting right dominant side: Secondary | ICD-10-CM | POA: Diagnosis present

## 2021-06-23 DIAGNOSIS — R531 Weakness: Secondary | ICD-10-CM | POA: Diagnosis not present

## 2021-06-23 DIAGNOSIS — Z953 Presence of xenogenic heart valve: Secondary | ICD-10-CM

## 2021-06-23 DIAGNOSIS — I110 Hypertensive heart disease with heart failure: Secondary | ICD-10-CM | POA: Diagnosis present

## 2021-06-23 DIAGNOSIS — I610 Nontraumatic intracerebral hemorrhage in hemisphere, subcortical: Secondary | ICD-10-CM

## 2021-06-23 DIAGNOSIS — I77810 Thoracic aortic ectasia: Secondary | ICD-10-CM | POA: Diagnosis present

## 2021-06-23 DIAGNOSIS — Z8614 Personal history of Methicillin resistant Staphylococcus aureus infection: Secondary | ICD-10-CM

## 2021-06-23 DIAGNOSIS — I671 Cerebral aneurysm, nonruptured: Secondary | ICD-10-CM | POA: Diagnosis present

## 2021-06-23 DIAGNOSIS — I5042 Chronic combined systolic (congestive) and diastolic (congestive) heart failure: Secondary | ICD-10-CM | POA: Diagnosis present

## 2021-06-23 DIAGNOSIS — Z7901 Long term (current) use of anticoagulants: Secondary | ICD-10-CM

## 2021-06-23 DIAGNOSIS — Z79899 Other long term (current) drug therapy: Secondary | ICD-10-CM

## 2021-06-23 DIAGNOSIS — R471 Dysarthria and anarthria: Secondary | ICD-10-CM | POA: Diagnosis present

## 2021-06-23 DIAGNOSIS — Z91010 Allergy to peanuts: Secondary | ICD-10-CM

## 2021-06-23 DIAGNOSIS — R1310 Dysphagia, unspecified: Secondary | ICD-10-CM | POA: Diagnosis present

## 2021-06-23 DIAGNOSIS — L03116 Cellulitis of left lower limb: Secondary | ICD-10-CM | POA: Diagnosis present

## 2021-06-23 DIAGNOSIS — Z20822 Contact with and (suspected) exposure to covid-19: Secondary | ICD-10-CM | POA: Diagnosis present

## 2021-06-23 DIAGNOSIS — R29810 Facial weakness: Secondary | ICD-10-CM | POA: Diagnosis present

## 2021-06-23 DIAGNOSIS — I618 Other nontraumatic intracerebral hemorrhage: Principal | ICD-10-CM

## 2021-06-23 DIAGNOSIS — F17210 Nicotine dependence, cigarettes, uncomplicated: Secondary | ICD-10-CM | POA: Diagnosis present

## 2021-06-23 DIAGNOSIS — I669 Occlusion and stenosis of unspecified cerebral artery: Secondary | ICD-10-CM | POA: Diagnosis present

## 2021-06-23 DIAGNOSIS — N400 Enlarged prostate without lower urinary tract symptoms: Secondary | ICD-10-CM | POA: Diagnosis present

## 2021-06-23 DIAGNOSIS — R0989 Other specified symptoms and signs involving the circulatory and respiratory systems: Secondary | ICD-10-CM

## 2021-06-23 DIAGNOSIS — I619 Nontraumatic intracerebral hemorrhage, unspecified: Secondary | ICD-10-CM | POA: Diagnosis present

## 2021-06-23 DIAGNOSIS — R944 Abnormal results of kidney function studies: Secondary | ICD-10-CM | POA: Diagnosis present

## 2021-06-23 DIAGNOSIS — Z9109 Other allergy status, other than to drugs and biological substances: Secondary | ICD-10-CM

## 2021-06-23 DIAGNOSIS — I482 Chronic atrial fibrillation, unspecified: Secondary | ICD-10-CM | POA: Diagnosis not present

## 2021-06-23 DIAGNOSIS — R001 Bradycardia, unspecified: Secondary | ICD-10-CM | POA: Diagnosis present

## 2021-06-23 LAB — RESP PANEL BY RT-PCR (FLU A&B, COVID) ARPGX2
Influenza A by PCR: NEGATIVE
Influenza B by PCR: NEGATIVE
SARS Coronavirus 2 by RT PCR: NEGATIVE

## 2021-06-23 LAB — COMPREHENSIVE METABOLIC PANEL
ALT: 13 U/L (ref 0–44)
AST: 25 U/L (ref 15–41)
Albumin: 3.6 g/dL (ref 3.5–5.0)
Alkaline Phosphatase: 117 U/L (ref 38–126)
Anion gap: 11 (ref 5–15)
BUN: 27 mg/dL — ABNORMAL HIGH (ref 8–23)
CO2: 24 mmol/L (ref 22–32)
Calcium: 10 mg/dL (ref 8.9–10.3)
Chloride: 102 mmol/L (ref 98–111)
Creatinine, Ser: 1.04 mg/dL (ref 0.61–1.24)
GFR, Estimated: >60 mL/min (ref >60)
Glucose, Bld: 88 mg/dL (ref 70–99)
Potassium: 3.8 mmol/L (ref 3.5–5.1)
Sodium: 137 mmol/L (ref 135–145)
Total Bilirubin: 1.4 mg/dL — ABNORMAL HIGH (ref 0.3–1.2)
Total Protein: 7.1 g/dL (ref 6.5–8.1)

## 2021-06-23 LAB — CBC
HCT: 45.3 % (ref 39.0–52.0)
Hemoglobin: 14.1 g/dL (ref 13.0–17.0)
MCH: 27.9 pg (ref 26.0–34.0)
MCHC: 31.1 g/dL (ref 30.0–36.0)
MCV: 89.7 fL (ref 80.0–100.0)
Platelets: 124 10*3/uL — ABNORMAL LOW (ref 150–400)
RBC: 5.05 MIL/uL (ref 4.22–5.81)
RDW: 18.9 % — ABNORMAL HIGH (ref 11.5–15.5)
WBC: 4.1 10*3/uL (ref 4.0–10.5)
nRBC: 0 % (ref 0.0–0.2)

## 2021-06-23 LAB — HEMOGLOBIN A1C
Hgb A1c MFr Bld: 5.2 % (ref 4.8–5.6)
Mean Plasma Glucose: 102.54 mg/dL

## 2021-06-23 LAB — I-STAT CHEM 8, ED
BUN: 30 mg/dL — ABNORMAL HIGH (ref 8–23)
Calcium, Ion: 1.13 mmol/L — ABNORMAL LOW (ref 1.15–1.40)
Chloride: 106 mmol/L (ref 98–111)
Creatinine, Ser: 1.1 mg/dL (ref 0.61–1.24)
Glucose, Bld: 86 mg/dL (ref 70–99)
HCT: 44 % (ref 39.0–52.0)
Hemoglobin: 15 g/dL (ref 13.0–17.0)
Potassium: 3.9 mmol/L (ref 3.5–5.1)
Sodium: 139 mmol/L (ref 135–145)
TCO2: 25 mmol/L (ref 22–32)

## 2021-06-23 LAB — CBG MONITORING, ED: Glucose-Capillary: 73 mg/dL (ref 70–99)

## 2021-06-23 LAB — ECHOCARDIOGRAM COMPLETE
AR max vel: 1.7 cm2
AV Area VTI: 1.55 cm2
AV Area mean vel: 1.64 cm2
AV Mean grad: 11 mmHg
AV Peak grad: 22.7 mmHg
Ao pk vel: 2.38 m/s
Area-P 1/2: 2.6 cm2
Calc EF: 61.6 %
Height: 74 in
MV VTI: 1.46 cm2
S' Lateral: 4 cm
Single Plane A2C EF: 55.8 %
Single Plane A4C EF: 69.2 %
Weight: 3040 oz

## 2021-06-23 LAB — DIFFERENTIAL
Abs Immature Granulocytes: 0.01 10*3/uL (ref 0.00–0.07)
Basophils Absolute: 0 10*3/uL (ref 0.0–0.1)
Basophils Relative: 1 %
Eosinophils Absolute: 0.1 10*3/uL (ref 0.0–0.5)
Eosinophils Relative: 3 %
Immature Granulocytes: 0 %
Lymphocytes Relative: 20 %
Lymphs Abs: 0.8 10*3/uL (ref 0.7–4.0)
Monocytes Absolute: 0.5 10*3/uL (ref 0.1–1.0)
Monocytes Relative: 13 %
Neutro Abs: 2.6 10*3/uL (ref 1.7–7.7)
Neutrophils Relative %: 63 %

## 2021-06-23 LAB — PROTIME-INR
INR: 1.5 — ABNORMAL HIGH (ref 0.8–1.2)
Prothrombin Time: 17.8 seconds — ABNORMAL HIGH (ref 11.4–15.2)

## 2021-06-23 LAB — APTT: aPTT: 40 seconds — ABNORMAL HIGH (ref 24–36)

## 2021-06-23 LAB — ETHANOL: Alcohol, Ethyl (B): <10 mg/dL (ref <10)

## 2021-06-23 LAB — MRSA NEXT GEN BY PCR, NASAL: MRSA by PCR Next Gen: NOT DETECTED

## 2021-06-23 MED ORDER — DOXYCYCLINE HYCLATE 100 MG PO TABS
100.0000 mg | ORAL_TABLET | Freq: Two times a day (BID) | ORAL | Status: DC
  Administered 2021-06-23 – 2021-06-27 (×9): 100 mg via ORAL
  Filled 2021-06-23 (×10): qty 1

## 2021-06-23 MED ORDER — SENNOSIDES-DOCUSATE SODIUM 8.6-50 MG PO TABS
1.0000 | ORAL_TABLET | Freq: Two times a day (BID) | ORAL | Status: DC
  Administered 2021-06-24 – 2021-06-27 (×7): 1 via ORAL
  Filled 2021-06-23 (×7): qty 1

## 2021-06-23 MED ORDER — CHLORHEXIDINE GLUCONATE CLOTH 2 % EX PADS
6.0000 | MEDICATED_PAD | Freq: Every day | CUTANEOUS | Status: DC
  Administered 2021-06-23 – 2021-06-27 (×5): 6 via TOPICAL

## 2021-06-23 MED ORDER — HYDROCODONE-ACETAMINOPHEN 5-325 MG PO TABS
1.0000 | ORAL_TABLET | Freq: Four times a day (QID) | ORAL | Status: DC | PRN
  Administered 2021-06-23 – 2021-06-26 (×4): 1 via ORAL
  Filled 2021-06-23 (×4): qty 1

## 2021-06-23 MED ORDER — CARVEDILOL 3.125 MG PO TABS
6.2500 mg | ORAL_TABLET | Freq: Two times a day (BID) | ORAL | Status: DC
  Filled 2021-06-23: qty 2

## 2021-06-23 MED ORDER — STROKE: EARLY STAGES OF RECOVERY BOOK: Freq: Once | Status: AC

## 2021-06-23 MED ORDER — CARVEDILOL 3.125 MG PO TABS
3.1250 mg | ORAL_TABLET | Freq: Two times a day (BID) | ORAL | Status: DC
  Administered 2021-06-23 – 2021-06-24 (×2): 3.125 mg via ORAL
  Filled 2021-06-23: qty 1

## 2021-06-23 MED ORDER — FUROSEMIDE 40 MG PO TABS
80.0000 mg | ORAL_TABLET | Freq: Every day | ORAL | Status: DC
  Administered 2021-06-23 – 2021-06-27 (×5): 80 mg via ORAL
  Filled 2021-06-23 (×5): qty 2

## 2021-06-23 MED ORDER — PANTOPRAZOLE SODIUM 40 MG PO TBEC
40.0000 mg | DELAYED_RELEASE_TABLET | Freq: Every day | ORAL | Status: DC
  Administered 2021-06-23 – 2021-06-27 (×5): 40 mg via ORAL
  Filled 2021-06-23 (×5): qty 1

## 2021-06-23 MED ORDER — CLEVIDIPINE BUTYRATE 0.5 MG/ML IV EMUL
0.0000 mg/h | INTRAVENOUS | Status: DC
Start: 2021-06-23 — End: 2021-06-24
  Administered 2021-06-23: 5 mg/h via INTRAVENOUS
  Administered 2021-06-23: 4 mg/h via INTRAVENOUS
  Administered 2021-06-23: 5 mg/h via INTRAVENOUS
  Administered 2021-06-23: 1 mg/h via INTRAVENOUS
  Filled 2021-06-23 (×3): qty 50

## 2021-06-23 MED ORDER — ACETAMINOPHEN 160 MG/5ML PO SOLN: 650.0000 mg | ORAL | Status: DC | PRN

## 2021-06-23 MED ORDER — ACETAMINOPHEN 650 MG RE SUPP: 650.0000 mg | RECTAL | Status: DC | PRN

## 2021-06-23 MED ORDER — ACETAMINOPHEN 325 MG PO TABS
650.0000 mg | ORAL_TABLET | ORAL | Status: DC | PRN
  Administered 2021-06-23 (×2): 650 mg via ORAL
  Filled 2021-06-23 (×2): qty 2

## 2021-06-23 MED ORDER — LABETALOL HCL 5 MG/ML IV SOLN: 20.0000 mg | Freq: Once | INTRAVENOUS | Status: DC

## 2021-06-23 MED ORDER — IOHEXOL 350 MG/ML SOLN
50.0000 mL | Freq: Once | INTRAVENOUS | Status: AC | PRN
  Administered 2021-06-23: 50 mL via INTRAVENOUS

## 2021-06-23 MED ORDER — HYDRALAZINE HCL 20 MG/ML IJ SOLN
5.0000 mg | INTRAMUSCULAR | Status: DC | PRN
  Administered 2021-06-24 – 2021-06-27 (×3): 10 mg via INTRAVENOUS
  Filled 2021-06-23 (×3): qty 1

## 2021-06-23 MED ORDER — SODIUM CHLORIDE 0.9 % IV SOLN: INTRAVENOUS | Status: DC

## 2021-06-23 MED ORDER — EMPTY CONTAINERS FLEXIBLE MISC
900.0000 mg | Freq: Once | Status: AC
  Administered 2021-06-23: 900 mg via INTRAVENOUS
  Filled 2021-06-23: qty 90

## 2021-06-23 MED ORDER — LOSARTAN POTASSIUM 50 MG PO TABS
100.0000 mg | ORAL_TABLET | Freq: Every day | ORAL | Status: DC
  Administered 2021-06-23 – 2021-06-27 (×5): 100 mg via ORAL
  Filled 2021-06-23 (×5): qty 2

## 2021-06-23 MED ORDER — GABAPENTIN 100 MG PO CAPS
100.0000 mg | ORAL_CAPSULE | Freq: Every day | ORAL | Status: DC
  Administered 2021-06-23 – 2021-06-26 (×4): 100 mg via ORAL
  Filled 2021-06-23 (×4): qty 1

## 2021-06-23 MED ORDER — ONDANSETRON HCL 4 MG/2ML IJ SOLN
4.0000 mg | Freq: Four times a day (QID) | INTRAMUSCULAR | Status: DC | PRN
  Administered 2021-06-23: 4 mg via INTRAVENOUS
  Filled 2021-06-23: qty 2

## 2021-06-23 MED ORDER — PANTOPRAZOLE SODIUM 40 MG IV SOLR: 40.0000 mg | Freq: Every day | INTRAVENOUS | Status: DC

## 2021-06-23 NOTE — Progress Notes (Addendum)
STROKE TEAM PROGRESS NOTE   INTERVAL HISTORY 75 year old male presenting with right sided weakness and numbness. Hospitalization has been unremarkable. No family member present at time of this exam. He has no complaints.     Vitals:   06/23/21 1300 06/23/21 1315 06/23/21 1330 06/23/21 1345  BP: 125/83 125/74 132/89 126/78  Pulse: (!) 54 (!) 56 62 (!) 55  Resp: 12 11 (!) 24 17  Temp:      TempSrc:      SpO2: 98% 98% 94% 97%  Weight:      Height:       CBC:  Recent Labs  Lab 06/23/21 0355 06/23/21 0401  WBC 4.1  --   NEUTROABS 2.6  --   HGB 14.1 15.0  HCT 45.3 44.0  MCV 89.7  --   PLT 124*  --    Basic Metabolic Panel:  Recent Labs  Lab 06/22/21 1032 06/23/21 0355 06/23/21 0401  NA 138 137 139  K 4.5 3.8 3.9  CL 99 102 106  CO2 31 24  --   GLUCOSE 69* 88 86  BUN 18 27* 30*  CREATININE 1.06 1.04 1.10  CALCIUM 10.0 10.0  --     Lipid Panel: No results for input(s): CHOL, TRIG, HDL, CHOLHDL, VLDL, LDLCALC in the last 168 hours.  HgbA1c:  Recent Labs  Lab 06/23/21 0400  HGBA1C 5.2   Urine Drug Screen: No results for input(s): LABOPIA, COCAINSCRNUR, LABBENZ, AMPHETMU, THCU, LABBARB in the last 168 hours.  Alcohol Level  Recent Labs  Lab 06/23/21 0355  ETH <10    IMAGING past 24 hours CT Angio Head W or Wo Contrast  Result Date: 06/23/2021 CLINICAL DATA:  75 year old male code stroke presentation with acute left thalamic hemorrhage. EXAM: CT ANGIOGRAPHY HEAD AND NECK TECHNIQUE: Multidetector CT imaging of the head and neck was performed using the standard protocol during bolus administration of intravenous contrast. Multiplanar CT image reconstructions and MIPs were obtained to evaluate the vascular anatomy. Carotid stenosis measurements (when applicable) are obtained utilizing NASCET criteria, using the distal internal carotid diameter as the denominator. CONTRAST:  76mL OMNIPAQUE IOHEXOL 350 MG/ML SOLN COMPARISON:  Plain head CT 0359 hours today.  Chest CT  08/03/2020. FINDINGS: CTA NECK Skeleton: Absent dentition. Small metallic object embedded at the midline mandible symphysis. Prior sternotomy. No acute osseous abnormality identified. Upper chest: Stable chronic thickening along the superior left major fissure. Other mild pulmonary increased interstitial markings and mild dependent atelectasis appears stable. No superior mediastinal lymphadenopathy. A small volume of retained fluid is noted in the upper thoracic esophagus which is nondilated. Other neck: No acute finding, neck mass, or lymphadenopathy identified. Aortic arch: Tortuous aortic arch with extensive atherosclerosis. 3 vessel arch configuration. Right carotid system: Brachiocephalic artery plaque and tortuosity without stenosis. Mildly tortuous right CCA with mild plaque, no stenosis. Mild soft and calcified plaque at the right ICA origin and bulb without proximal right ICA stenosis. But in the upper neck at the C1 level there is partially calcified fusiform right ICA pseudoaneurysm extending about 2 cm in length (series 11, image 64) and up to 13 mm diameter. There is no associated stenosis. Left carotid system: Tortuous proximal left CCA with mild calcified plaque and no stenosis. Calcified plaque at the posterior left ICA origin without stenosis. Distal to the bulb there is a saccular pseudoaneurysm of the left ICA directed anteriorly and measuring up to 9 mm diameter, also about 9 mm in length. No associated stenosis. See  series 6, image 92 and series 10, image 95. Vertebral arteries: Soft and calcified plaque in the proximal right subclavian artery without significant stenosis. Moderate calcified plaque at the right vertebral artery origin but only mild stenosis results. Additional right V1 and V2 segment calcified plaque with up to 50% stenosis at the C5-C6 level on series 9, image 244. The right vertebral remains patent to the skull base. Mild to moderate proximal left subclavian plaque without  significant stenosis. Soft and calcified plaque near the left vertebral artery origin with only mild stenosis. Left vertebral is non dominant and mildly tortuous in the neck but patent to the skull base without additional plaque or stenosis. CTA HEAD Posterior circulation: Dominant right vertebral artery. Extensive bilateral V4 calcified plaque (series 9, image 154) with moderate to severe bilateral V4 segment stenosis proximal to the vertebrobasilar junction. PICA origins and vertebrobasilar junction remain patent. The basilar artery is moderately calcified (series 9, image 115) but without hemodynamically significant stenosis. Bilateral SCA and PCA origins are patent without stenosis. Posterior communicating arteries are diminutive or absent. Right PCA branches are within normal limits. There is mild left PCA P3 segment irregularity on series 14, image 23 in proximity to the left thalamic hemorrhage. But there is no CTA spot sign, left PCA aneurysm or other significant stenosis. Anterior circulation: Both ICA siphons are patent but moderately calcified. Still, only mild siphon stenosis results. Patent carotid termini, MCA and ACA origins. Tortuous A1 segments, the right is mildly dominant. Diminutive or absent anterior communicating artery. ACA branches are within normal limits. Left MCA M1 segment and trifurcation are patent without stenosis. Left MCA branches are mildly irregular. Right MCA M1 segment and bifurcation are patent without stenosis. Right MCA branches are within normal limits. Venous sinuses: Early contrast timing, not well evaluated. Anatomic variants: Dominant right vertebral artery. Review of the MIP images confirms the above findings IMPRESSION: 1. Negative for LVO, CTA spot sign, or intracranial aneurysm in association with the left thalamic hemorrhage. There is mild left PCA P3 segment irregularity and stenosis. 2. Positive for bilateral cervical ICA pseudoaneurysms, 13 x 20 mm on the right  and 9 x 9 mm on the left. This raises the possibility of underlying Fibromuscular Dysplasia (FMD). There is no aneurism-associated stenosis. 3. Positive also for extensive Calcified Atherosclerosis. 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 There is only mild ICA siphon and Basilar Artery stenosis. Aortic Atherosclerosis (ICD10-I70.0). 4. Partially visible chronic lung disease, prior CABG. Electronically Signed   By: Odessa Fleming M.D.   On: 06/23/2021 04:34   CT HEAD WO CONTRAST ( )  Result Date: 06/23/2021 CLINICAL DATA:  Follow-up intracranial hemorrhage. EXAM: CT HEAD WITHOUT CONTRAST TECHNIQUE: Contiguous axial images were obtained from the base of the skull through the vertex without intravenous contrast. COMPARISON:  06/23/2021 3:59 a.m. FINDINGS: Brain: Redemonstrated hyperdense hemorrhage centered in the left thalamus, measuring up to 2.0 x 3.2 x 2.5 cm (AP x TR x CC) (series 3, image 17 and series 5, image 45), unchanged when remeasured similarly. Again noted is extension of the hemorrhage into the left midbrain. No intraventricular or extra-axial extension of hemorrhage. Unchanged mild surrounding edema with limited regional mass effect. No hydrocephalus. No significant midline shift. Periventricular white matter changes, likely the sequela of chronic small vessel ischemic disease. No acute infarction. Vascular: No hyperdense vessel. Atherosclerotic calcifications in the intracranial carotid and vertebral arteries. Skull: Normal. Negative for fracture or focal lesion. Sinuses/Orbits: No acute finding. Other: The mastoids are  well aerated. IMPRESSION: Overall unchanged acute hemorrhage centered in the left thalamus, without intraventricular or extra-axial extension of blood. Unchanged mild surrounding edema and mild regional mass effect. Electronically Signed   By: Wiliam Ke M.D.   On: 06/23/2021 12:21   CT Angio Neck W and/or Wo Contrast  Result Date: 06/23/2021 CLINICAL  DATA:  75 year old male code stroke presentation with acute left thalamic hemorrhage. EXAM: CT ANGIOGRAPHY HEAD AND NECK TECHNIQUE: Multidetector CT imaging of the head and neck was performed using the standard protocol during bolus administration of intravenous contrast. Multiplanar CT image reconstructions and MIPs were obtained to evaluate the vascular anatomy. Carotid stenosis measurements (when applicable) are obtained utilizing NASCET criteria, using the distal internal carotid diameter as the denominator. CONTRAST:  75mL OMNIPAQUE IOHEXOL 350 MG/ML SOLN COMPARISON:  Plain head CT 0359 hours today.  Chest CT 08/03/2020. FINDINGS: CTA NECK Skeleton: Absent dentition. Small metallic object embedded at the midline mandible symphysis. Prior sternotomy. No acute osseous abnormality identified. Upper chest: Stable chronic thickening along the superior left major fissure. Other mild pulmonary increased interstitial markings and mild dependent atelectasis appears stable. No superior mediastinal lymphadenopathy. A small volume of retained fluid is noted in the upper thoracic esophagus which is nondilated. Other neck: No acute finding, neck mass, or lymphadenopathy identified. Aortic arch: Tortuous aortic arch with extensive atherosclerosis. 3 vessel arch configuration. Right carotid system: Brachiocephalic artery plaque and tortuosity without stenosis. Mildly tortuous right CCA with mild plaque, no stenosis. Mild soft and calcified plaque at the right ICA origin and bulb without proximal right ICA stenosis. But in the upper neck at the C1 level there is partially calcified fusiform right ICA pseudoaneurysm extending about 2 cm in length (series 11, image 64) and up to 13 mm diameter. There is no associated stenosis. Left carotid system: Tortuous proximal left CCA with mild calcified plaque and no stenosis. Calcified plaque at the posterior left ICA origin without stenosis. Distal to the bulb there is a saccular  pseudoaneurysm of the left ICA directed anteriorly and measuring up to 9 mm diameter, also about 9 mm in length. No associated stenosis. See series 6, image 92 and series 10, image 95. Vertebral arteries: Soft and calcified plaque in the proximal right subclavian artery without significant stenosis. Moderate calcified plaque at the right vertebral artery origin but only mild stenosis results. Additional right V1 and V2 segment calcified plaque with up to 50% stenosis at the C5-C6 level on series 9, image 244. The right vertebral remains patent to the skull base. Mild to moderate proximal left subclavian plaque without significant stenosis. Soft and calcified plaque near the left vertebral artery origin with only mild stenosis. Left vertebral is non dominant and mildly tortuous in the neck but patent to the skull base without additional plaque or stenosis. CTA HEAD Posterior circulation: Dominant right vertebral artery. Extensive bilateral V4 calcified plaque (series 9, image 154) with moderate to severe bilateral V4 segment stenosis proximal to the vertebrobasilar junction. PICA origins and vertebrobasilar junction remain patent. The basilar artery is moderately calcified (series 9, image 115) but without hemodynamically significant stenosis. Bilateral SCA and PCA origins are patent without stenosis. Posterior communicating arteries are diminutive or absent. Right PCA branches are within normal limits. There is mild left PCA P3 segment irregularity on series 14, image 23 in proximity to the left thalamic hemorrhage. But there is no CTA spot sign, left PCA aneurysm or other significant stenosis. Anterior circulation: Both ICA siphons are patent but moderately calcified.  Still, only mild siphon stenosis results. Patent carotid termini, MCA and ACA origins. Tortuous A1 segments, the right is mildly dominant. Diminutive or absent anterior communicating artery. ACA branches are within normal limits. Left MCA M1 segment  and trifurcation are patent without stenosis. Left MCA branches are mildly irregular. Right MCA M1 segment and bifurcation are patent without stenosis. Right MCA branches are within normal limits. Venous sinuses: Early contrast timing, not well evaluated. Anatomic variants: Dominant right vertebral artery. Review of the MIP images confirms the above findings IMPRESSION: 1. Negative for LVO, CTA spot sign, or intracranial aneurysm in association with the left thalamic hemorrhage. There is mild left PCA P3 segment irregularity and stenosis. 2. Positive for bilateral cervical ICA pseudoaneurysms, 13 x 20 mm on the right and 9 x 9 mm on the left. This raises the possibility of underlying Fibromuscular Dysplasia (FMD). There is no aneurism-associated stenosis. 3. Positive also for extensive Calcified Atherosclerosis. 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 There is only mild ICA siphon and Basilar Artery stenosis. Aortic Atherosclerosis (ICD10-I70.0). 4. Partially visible chronic lung disease, prior CABG. Electronically Signed   By: Odessa Fleming M.D.   On: 06/23/2021 04:34   Chest Port 1 View  Result Date: 06/23/2021 CLINICAL DATA:  75 year old male code stroke presentation, left thalamic hemorrhage. EXAM: PORTABLE CHEST 1 VIEW COMPARISON:  Chest radiographs 06/22/2021 and earlier. FINDINGS: Portable AP upright view at 0442 hours. Lower lung volumes. Cardiomegaly and diffuse bilateral pulmonary interstitial opacity appear mildly increased from yesterday. Small right pleural effusion is stable. No pneumothorax. Visualized tracheal air column is within normal limits. Prior CABG. No acute osseous abnormality identified. IMPRESSION: Lower lung volumes with increased appearance of cardiomegaly and acute pulmonary edema since yesterday. Stable right pleural effusion. Electronically Signed   By: Odessa Fleming M.D.   On: 06/23/2021 05:42   CT HEAD CODE STROKE WO CONTRAST  Addendum Date: 06/23/2021    ADDENDUM REPORT: 06/23/2021 04:34 ADDENDUM: Salient findings were communicated to Dr. Wilford Corner at 0416 hours on 06/23/2021 by text page via the [facility] messaging system. Electronically Signed   By: Odessa Fleming M.D.   On: 06/23/2021 04:34   Result Date: 06/23/2021 CLINICAL DATA:  Code stroke.  75 year old male. EXAM: CT HEAD WITHOUT CONTRAST TECHNIQUE: Contiguous axial images were obtained from the base of the skull through the vertex without intravenous contrast. COMPARISON:  Head CT 04/14/2014. FINDINGS: Brain: Oval and crescent-shaped hyperdense hemorrhage centered within the left thalamus encompasses 21 x 32 x 28 mm (AP by transverse by CC) for an estimated volume 9 mL. No intraventricular or extra-axial extension of blood is identified. There is surrounding edema but only mild regional mass effect. A small focus of hemorrhage tracks into the left midbrain (series 5, image 41). Elsewhere patchy bilateral cerebral white matter hypodensity and a chronic appearing lacunar infarct of the left basal ganglia have progressed since 2015. Overall cerebral volume has not significantly changed. No midline shift, ventriculomegaly, or evidence of cortically based acute infarction. Vascular: Extensive Calcified atherosclerosis at the skull base. There is a degree of generalized intracranial artery tortuosity. No suspicious intracranial vascular hyperdensity. Skull: Stable and intact. Sinuses/Orbits: Visualized paranasal sinuses and mastoids are stable and well aerated. Other: There is scattered scalp vessel calcified atherosclerosis. No acute orbit or scalp soft tissue finding. ASPECTS [facility] Stroke Program Early CT Score) Total score (0-10 with 10 being normal): Not applicable, acute hemorrhage. IMPRESSION: 1. Acute hemorrhage centered in the left thalamus with an estimated volume of  9 mL. No intraventricular or extra-axial extension of blood. Surrounding edema but only mild regional mass effect. 2. 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 3. Generalized intracranial artery tortuosity. Electronically Signed: By: Odessa Fleming M.D. On: 06/23/2021 04:18    PHYSICAL EXAM  General: Awake alert in no distress HEENT: Normocephalic/atraumatic Lungs: Clear Cardiovascular: Irregularly irregular Abdomen nondistended nontender Extremities: Extensive cellulitis  Neurological exam Awake alert oriented to self, knows he is in hospital  Unable to tell me the date or time Speech is mildly dysarthric Poor attention concentration   Cranial nerves: Pupils equal round react light, extraocular movements appear unhindered.  blink to threat inconsistent, right lower facial weakness observed, tongue and palate midline. Motor examination with flaccid right upper extremity and  2/5 movement in the right lower extremity.  Left upper and lower extremity are normal Sensation: moderate sensory loss on the right hemibody. Coordination: No gross dysmetria on the left, unable to follow on the right.  ASSESSMENT/PLAN [name] is a 75 y.o. male with history of atrial fibrillation, on Eliquis, aortic stenosis, coronary artery disease, bilateral lower extremity cellulitis, combined CHF, essential hypertension, hyperlipidemia, thrombocytopenia, presented to the emergency room via EMS when the family called EMS for evaluation of abrupt onset of right-sided weakness and numbness.  The patient was last seen well at 8 PM on 06/22/2021 when he went to bed.  He was noted sometime in the middle of the night to be weak on the right side and have slurred speech.  EMTs also felt that he was a little confused and unable to answer all questions appropriately.  LVO positive code stroke was activated. He was seen and evaluated emergently on the ER bridge-symptoms are consistent with right hemiparesis and right-sided sensory loss.  Noncontrasted head CT showed left thalamic bleed around 9 cc without IVH. CT angiography  with no vascular malformation and delayed images negative for spot sign Likely hypertensive bleed due to coagulopathy from Eliquis and hypertension   On initial exam our team spoke with patient's daughter Blanchie Serve over the phone.  She reports that he is very noncompliant with his medications.  He takes what ever he likes.  She was concerned that he was taking excessive gingerroot which she knows is a potent blood thinner and might have contributed to this.  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  Atrophy.   CTA head & neck: negative for LVO, CTA spot sign, or intracranial aneursym. Left PCA P3 segment irregularity and stenosis. Extensive calcified atherosclerosis. Severe bilateval V4 vetrtebral artery stenosis. 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  Hold off hydralazine for now BP goal less than 160 given stable hematoma on repeat CT Long-term BP goal normotensive  Hyperlipidemia Home meds:  none LDL pending, goal less than 70 Continue statin at discharge  Other Stroke Risk Factors  Advanced Age >/= 41   Cigarette smoker advised to  stop smoking  Substance abuse - UDS:  THC POSITIVE Patient advised to stop using due to stroke risk. Congestive heart failure A. fib on Eliquis CAD  Other Active Problems Chronic thrombocytopenia Recent bilateral cellulitis on doxycycline     Hospital day # 0   ATTENDING NOTE: I reviewed above note and agree with the assessment and plan. Pt was seen and examined.   75 year old male with history of A. fib on Eliquis, aortic stenosis, CAD status post CABG, CHF, hypertension, hyperlipidemia, thrombocytopenia, recent bilateral cellulitis admitted for right-sided weakness numbness and slurred speech.  CT showed left thalamic ICH.  Status post Andexxa.  CTA head and neck no aneurysm or AVM.  Repeat CT stable hematoma.  MRI with and without contrast pending.  EF 60 to 65%, possible severe MR.  LDL 5.2, creatinine 1.10, LDL pending.  Per pharmacy, patient takes medication regularly.  On exam, patient lethargic, but awake alert, orientated to place, age, but not to time.  With eyes opening, eyes midline, no gaze palsy, visual field full.  Right facial droop, tongue midline.  Right upper extremity 3 -/5, right lower extremity 3/5, left upper and lower extremity at least 4/5.  Sensation decreased on the right face arm and leg.  Finger-to-nose intact on the left.  Bilateral lower extremity redness, recent diagnosed with cellulitis.  Etiology for patient ICH likely due to Eliquis use in the setting of his hypertension.  Repeat CT stable hematoma.  Will check MRI in the a.m.  Continue BP management, BP goal relax to less than 160.  Resume home p.o. BP meds, and taper off Cleviprex as able.  Continue doxycycline for cellulitis treatment.  Still has A. fib with bradycardia, has resumed half dose of the Coreg, continue monitoring.  PT/OT pending.  Passed swallow, put on diet.  For detailed assessment and plan, please refer to above as I have made changes wherever appropriate.   This patient is critically ill due to ICH, A. fib on Eliquis status post Andexxa, hypertensive emergency and at significant risk of neurological worsening, death form hematoma expansion, cerebral edema, brain herniation, heart failure, hypertensive encephalopathy, seizure. This  patient's care requires constant monitoring of vital signs, hemodynamics, respiratory and cardiac monitoring, review of multiple databases, neurological assessment, discussion with family, other specialists and medical decision making of high complexity. I spent 40 minutes of neurocritical care time in the care of this patient.  Marvel PlanJindong Sumner Kirchman, MD PhD Stroke Neurology 06/23/2021 5:48 PM    To contact Stroke Continuity provider, please refer to [email or web address]. After hours, contact General Neurology

## 2021-06-23 NOTE — ED Notes (Signed)
Pt c/o nausea. Message sent to provider requesting medication at this time

## 2021-06-23 NOTE — Progress Notes (Signed)
OT Cancellation Note  Patient Details Name: Carlos Welch MRN: [id number] DOB: Feb 26, 1946   Cancelled Treatment:    Reason Eval/Treat Not Completed: Active bedrest order  [facility] Faye Sanfilippo, OT/L   Acute OT Clinical Specialist Acute Rehabilitation Services Pager [phone] Office [phone]  06/23/2021, 7:06 AM

## 2021-06-23 NOTE — Code Documentation (Signed)
Stroke Response Nurse Documentation Code Documentation  Carlos Welch is a 75 y.o. male arriving to [facility] ED via [facility] on 9/8 with past medical hx of afib on eliquis (9/7),HTN,HLD,AVR. On Eliquis (apixaban) daily. Code stroke was activated by EMS.   Patient from home where he was LKW at 2000 9/7 and now complaining of right sided facial droop, right sided weakness and slurred speech.  Stroke team at the bedside on patient arrival. Labs drawn and patient cleared for CT by Dr. Madilyn Hook. Patient to CT with team. NIHSS 14, see documentation for details and code stroke times. Patient with decreased LOC, disoriented, right facial droop, right arm weakness, right leg weakness, right decreased sensation, dysarthria , and Sensory  neglect on exam. The following imaging was completed:  CT and CTA head and neck. Patient is not a candidate for IV Thrombolytic due to hemorrhage. Cleviprex and Andrexxa initiated.    Bedside handoff with ED RN Bonita Quin.    Rose Fillers  Rapid Response RN

## 2021-06-23 NOTE — ED Notes (Signed)
Radiology at bedside

## 2021-06-23 NOTE — ED Triage Notes (Signed)
BIB [facility] from home for CVA symptoms. Couldn't feel anything on the rt side initially per EMS symptoms progressively worse. Found at 0320 last known well time 06/22/2021 2000. On blood thinner Eliquis BID and did take lastnight. Rt side weakness, confusion gaze to lt, facial drop rt side

## 2021-06-23 NOTE — Evaluation (Signed)
Speech Language Pathology Evaluation Patient Details Name: Carlos Welch MRN: [id number] DOB: [birth year] Today's Date: 06/23/2021 Time: 9024-0973 SLP Time Calculation (min) (ACUTE ONLY): 17 min  Problem List:  Patient Active Problem List   Diagnosis Date Noted   Intracerebral hemorrhage (HCC) 06/23/2021   Acute exacerbation of CHF (congestive heart failure) (HCC) 08/08/2020   Abscess of jaw 07/19/2020   Admission for palliative care 07/19/2020   Aortic valve stenosis 07/19/2020   Bacteremia 07/19/2020   Cognitive communication deficit 07/19/2020   Coronary artery disease involving coronary bypass graft of native heart without angina pectoris 07/19/2020   Debility 07/19/2020   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   Nonrheumatic mitral valve regurgitation 07/19/2020   Severe malnutrition (HCC) 07/19/2020   TIA (transient ischemic attack) 07/19/2020   Weight loss 07/19/2020   Acute on chronic heart failure with preserved ejection fraction (HCC) 07/08/2020   BPH (benign prostatic hyperplasia) 07/08/2020   Cellulitis of lower extremity 07/08/2020   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 s/p pericardial AVR 2015.   Aortic valve stenosis 07/19/2020   Bacteremia 07/19/2020   Bacterial endocarditis    BPH (benign prostatic hyperplasia) 07/08/2020   CAD (coronary artery disease)    a. s/p CABG, AVR, LAA clipping 2015 at [facility].   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/08/2020   Elevated troponin 07/08/2020   Essential hypertension 07/08/2020   Generalized abdominal pain    History of noncompliance with medical treatment    Hyperlipemia 07/08/2020   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   Malnutrition (HCC)    MRSA bacteremia    Nonrheumatic mitral valve regurgitation 07/19/2020   Normocytic anemia 07/08/2020   Pressure injury of skin 05/12/2020   S/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 2015   Severe malnutrition (HCC) 07/19/2020   Thrombocytopenia (HCC) 07/08/2020   TIA (transient ischemic attack)    Weight loss 07/19/2020   Past Surgical History:  Past Surgical History:  Procedure Laterality Date   AORTIC VALVE REPLACEMENT     CORONARY ARTERY BYPASS GRAFT  2015   HERNIA REPAIR     REMOVAL OF IMPLANT  03/14/2020   TEE WITHOUT CARDIOVERSION N/A 05/13/2020   Procedure: TRANSESOPHAGEAL ECHOCARDIOGRAM (TEE);  Surgeon: Lewayne Bunting, MD;  Location: [facility] ENDOSCOPY;  Service: Cardiovascular;  Laterality: N/A;   HPI:  75 y.o. male with PMH: aFib, aortic stenosis, CAD, bilateral lower extremity cellulitis, combined CHF, HTN, HLD, thrombocytopenia, presented toEd via EMS for evaluation of abrupt onset of right-sided weakness and numbness. LVO positive code stroke was activated. Noncontrasted head CT showed left thalamic bleed around 9 cc  without IVH.   Assessment / Plan / Recommendation Clinical Impression  Pt was seen for a speech-language evaluation. Pt awake and alert but with difficulty keeping his eyes open throughout. SLP conducted oral mechanism  exam revealing right-sided asymmetry, facial weakness, and reduced ROM and was otherwise unremarkable. Motor speech impairments difficult to assess at this time d/t decreased vocal intensity and significantly shortened utterances, but pt is largely intelligible at the word and phrase levels. Pt required verbal, auditory, and visual cues to follow complex commands during oral exam (put your tongue inside your cheek, open mouth and say 'ah'). Pt oriented to self but not time, place, or situation. SLP administered bedside WAB. Pt verbal expression c/b agrammatic, nonfluent and halting speech. Performance on WAB revealed deficits in repetition at the phrase and sentence levels, following two-step commands, answering questions containing temporal concepts, and confrontation naming. Pt aware of paraphasias and able to self-correct during conversation but unaware of paraphasias during confrontation naming. SLP will follow for language therapy targeting expressive/receptive aphasia.    SLP Assessment  SLP Recommendation/Assessment: Patient needs continued Speech Lanaguage Pathology Services SLP Visit Diagnosis: Cognitive communication deficit (R41.841);Aphasia (R47.01)    Follow Up Recommendations   (TBD)    Frequency and Duration min 2x/week  2 weeks      SLP Evaluation Cognition  Overall Cognitive Status: Impaired/Different from baseline Arousal/Alertness: Awake/alert Orientation Level: Oriented to person;Disoriented to place;Disoriented to time;Disoriented to situation Year: 2022 (Word finding made it difficult; able to finish year when therapist started with "two thousand and...") Day of Week: Incorrect Awareness: Impaired Awareness Impairment: Intellectual impairment;Emergent impairment Problem Solving: Impaired Problem Solving Impairment: Functional basic Executive Function: Self Monitoring;Self Correcting Self Monitoring: Impaired (Impaired for functional, intact for verbal) Self Monitoring  Impairment: Functional basic Self Correcting: Impaired (Impaired for functional, intact for verbal) Self Correcting Impairment: Functional basic Safety/Judgment: Other (comment) (suspect impaired)       Comprehension  Auditory Comprehension Overall Auditory Comprehension: Impaired Yes/No Questions: Within Functional Limits (for simple questions, impaired with temporal concepts) Commands: Impaired Two Step Basic Commands: 0-24% accurate Conversation: Simple Interfering Components: Visual impairments Visual Recognition/Discrimination Discrimination:  (Visual impairment severity undetermined) Reading Comprehension Reading Status:  (TBA)    Expression Expression Primary Mode of Expression: Verbal Verbal Expression Overall Verbal Expression: Impaired Initiation: No impairment Level of Generative/Spontaneous Verbalization: Phrase Repetition: Impaired Level of Impairment: Phrase level (Omits/substitutes function words) Naming: Impairment Confrontation: Impaired Convergent:  (body parts more intact) Verbal Errors: Semantic paraphasias;Perseveration;Aware of errors Pragmatics: Unable to assess Effective Techniques: Semantic cues;Sentence completion;Phonemic cues Non-Verbal Means of Communication: Not applicable Written Expression Dominant Hand: Right Written Expression: Not tested (TBA)   Oral / Motor  Oral Motor/Sensory Function Overall Oral Motor/Sensory Function: Mild impairment Facial ROM: Reduced right;Suspected CN VII (facial) dysfunction Facial Symmetry: Abnormal symmetry right;Suspected CN VII (facial) dysfunction Facial Strength: Reduced right Facial Sensation: Reduced right;Suspected CN V (Trigeminal) dysfunction Lingual ROM: Within Functional Limits Lingual Symmetry: Within Functional Limits Lingual Strength: Within Functional Limits Velum: Within Functional Limits Mandible: Within Functional Limits Motor Speech Overall Motor Speech: Impaired Respiration:  Impaired Level of Impairment: Sentence (increased WOB) Phonation: Hoarse Resonance: Within functional limits Articulation: Within functional limitis Intelligibility: Intelligible Motor Planning: Witnin functional limits   GO           [employer], SLP-Student          Jeannie Done 06/23/2021, 12:32 PM

## 2021-06-23 NOTE — Evaluation (Signed)
Clinical/Bedside Swallow Evaluation Patient Details  Name: Carlos Welch MRN: [id number] Date of Birth: June 09, 1946  Today's Date: 06/23/2021 Time: SLP Start Time (ACUTE ONLY): 0900 SLP Stop Time (ACUTE ONLY): 0917 SLP Time Calculation (min) (ACUTE ONLY): 17 min  Past Medical History:  Past Medical History:  Diagnosis Date   A-fib (HCC)    Abscess of jaw 07/19/2020   Acute on chronic heart failure with preserved ejection fraction (HCC) 07/08/2020   Aortic stenosis    a. s/p pericardial AVR 2015.   Aortic valve stenosis 07/19/2020   Bacteremia 07/19/2020   Bacterial endocarditis    BPH (benign prostatic hyperplasia) 07/08/2020   CAD (coronary artery disease)    a. s/p CABG, AVR, LAA clipping 2015 at [facility].   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/08/2020   Elevated troponin 07/08/2020   Essential hypertension 07/08/2020   Generalized abdominal pain    History of noncompliance with medical treatment    Hyperlipemia 07/08/2020   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   Malnutrition (HCC)    MRSA bacteremia    Nonrheumatic mitral valve regurgitation 07/19/2020   Normocytic anemia 07/08/2020   Pressure injury of skin 05/12/2020   S/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 2015   Severe malnutrition (HCC) 07/19/2020   Thrombocytopenia (HCC) 07/08/2020   TIA (transient ischemic attack)    Weight loss 07/19/2020   Past Surgical History:  Past Surgical History:  Procedure Laterality Date   AORTIC VALVE REPLACEMENT     CORONARY ARTERY BYPASS GRAFT  2015   HERNIA REPAIR     REMOVAL OF IMPLANT   03/14/2020   TEE WITHOUT CARDIOVERSION N/A 05/13/2020   Procedure: TRANSESOPHAGEAL ECHOCARDIOGRAM (TEE);  Surgeon: Lewayne Bunting, MD;  Location: [facility] ENDOSCOPY;  Service: Cardiovascular;  Laterality: N/A;   HPI:  75 y.o. male with PMH: aFib, aortic stenosis, CAD, bilateral lower extremity cellulitis, combined CHF, HTN, HLD, thrombocytopenia, presented toEd via EMS for evaluation of abrupt onset of right-sided weakness and numbness. LVO positive code stroke was activated. Noncontrasted head CT showed left thalamic bleed around 9 cc without IVH.   Assessment / Plan / Recommendation Clinical Impression  Pt was seen for a bedside swallow evaluation. Oral mechanism exam revealed right-sided asymmetry, facial weakness, and reduced ROM and was otherwise unremarkable. Volitional swallow and cough were WFL. Pt edentulous but with dentures on top and bottom during session. SLP completed oral care and trialed thin and nectar-thick liquid, puree, and regular solid textures. Oral phase c/b mild right anterior spillage with thins and puree and mild oral residue with puree and regular solids which was cleared with liquid wash. Pt exhibited immediate and delayed throat clear with thin liquids but otherwise showed no s/s of aspiration with any other consistencies. SLP will f/u with MBS to determine safety of swallow with thin liquids and mixed consistencies. Recommend Dys2/nectar thick liquids at this time until instrumental swallow study can be completed. Administer medications crushed in puree. SLP Visit Diagnosis: Dysphagia, unspecified (R13.10)    Aspiration Risk       Diet Recommendation Dysphagia 2 (Fine chop);Nectar-thick liquid   Liquid Administration via: Straw Medication Administration: Crushed with  puree Supervision: Patient able to self feed;Intermittent supervision to cue for compensatory strategies Compensations: Lingual sweep for clearance of pocketing;Monitor for anterior loss Postural Changes:  Seated upright at 90 degrees;Remain upright for at least 30 minutes after po intake    Other  Recommendations Oral Care Recommendations: Oral care BID Other Recommendations: Order thickener from pharmacy   Follow up Recommendations  (TBD)      Frequency and Duration min 1 x/week  2 weeks       Prognosis Prognosis for Safe Diet Advancement: Good Barriers to Reach Goals: Language deficits      Swallow Study   General HPI: 75 y.o. male with PMH: aFib, aortic stenosis, CAD, bilateral lower extremity cellulitis, combined CHF, HTN, HLD, thrombocytopenia, presented toEd via EMS for evaluation of abrupt onset of right-sided weakness and numbness. LVO positive code stroke was activated. Noncontrasted head CT showed left thalamic bleed around 9 cc without IVH. Type of Study: Bedside Swallow Evaluation Diet Prior to this Study: NPO Temperature Spikes Noted: No Respiratory Status: Nasal cannula History of Recent Intubation: No Behavior/Cognition: Alert;Cooperative;Pleasant mood;Lethargic/Drowsy Oral Cavity Assessment: Within Functional Limits Oral Care Completed by SLP: Yes Oral Cavity - Dentition: Dentures, top;Dentures, bottom Vision: Functional for self-feeding Self-Feeding Abilities: Able to feed self Patient Positioning: Upright in bed Baseline Vocal Quality: Hoarse;Low vocal intensity Volitional Cough: Weak Volitional Swallow: Able to elicit    Oral/Motor/Sensory Function Overall Oral Motor/Sensory Function: Mild impairment Facial ROM: Reduced right;Suspected CN VII (facial) dysfunction Facial Symmetry: Abnormal symmetry right;Suspected CN VII (facial) dysfunction Facial Strength: Reduced right Facial Sensation: Within Functional Limits Lingual ROM: Within Functional Limits Lingual Symmetry: Within Functional Limits Lingual Strength: Within Functional Limits Velum: Within Functional Limits Mandible: Within Functional Limits   Ice Chips Ice chips: Not tested   Thin Liquid Thin  Liquid: Impaired Presentation: Cup;Straw;Self Fed Oral Phase Impairments: Reduced labial seal Oral Phase Functional Implications: Right anterior spillage Pharyngeal  Phase Impairments: Throat Clearing - Immediate;Throat Clearing - Delayed    Nectar Thick Nectar Thick Liquid: Within functional limits Presentation: Self Fed;Cup   Honey Thick Honey Thick Liquid: Not tested   Puree Puree: Impaired Presentation: Self Fed;Spoon Oral Phase Impairments: Reduced labial seal Oral Phase Functional Implications: Right anterior spillage   Solid     Solid: Impaired Presentation: Self Fed Oral Phase Impairments: Other (comment) (reduced facial/buccal strength) Oral Phase Functional Implications: Oral residue     Jeannie Done, SLP-Student   Jeannie Done 06/23/2021,12:44 PM

## 2021-06-23 NOTE — ED Notes (Signed)
Provider at bedside

## 2021-06-23 NOTE — ED Provider Notes (Signed)
[facility] EMERGENCY DEPARTMENT Provider Note   CSN: [id number] Arrival date & time: 06/23/21  4098  An emergency department physician performed an initial assessment on this suspected stroke patient at 0353.  History No chief complaint on file.   Ruford L Sako is a 75 y.o. male.  The history is provided by the patient.  COLBURN ASPER is a 75 y.o. male who presents to the Emergency Department complaining of code stroke.  Level V caveat due to AMS.  Hx is provided by EMS.  He presents to the ED by EMS as a code stroke for right sided weakness, last known well at 8pm last night.  Lives with his wife.  He developed abrupt right sided weakness and numbness.  Takes eliquis for hx/o afib.  Sxs are severe and constant in nature.      Past Medical History:  Diagnosis Date   A-fib (HCC)    Abscess of jaw 07/19/2020   Acute on chronic heart failure with preserved ejection fraction (HCC) 07/08/2020   Aortic stenosis    a. s/p pericardial AVR 2015.   Aortic valve stenosis 07/19/2020   Bacteremia 07/19/2020   Bacterial endocarditis    BPH (benign prostatic hyperplasia) 07/08/2020   CAD (coronary artery disease)    a. s/p CABG, AVR, LAA clipping 2015 at [facility].   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/08/2020   Elevated troponin 07/08/2020   Essential hypertension 07/08/2020   Generalized abdominal pain    History of noncompliance with medical treatment    Hyperlipemia 07/08/2020   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   Malnutrition (HCC)    MRSA bacteremia    Nonrheumatic mitral valve  regurgitation 07/19/2020   Normocytic anemia 07/08/2020   Pressure injury of skin 05/12/2020   S/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 2015   Severe malnutrition (HCC) 07/19/2020   Thrombocytopenia (HCC) 07/08/2020   TIA (transient ischemic attack)    Weight loss 07/19/2020    Patient Active Problem List   Diagnosis Date Noted   Intracerebral hemorrhage (HCC) 06/23/2021   Acute exacerbation of CHF (congestive heart failure) (HCC) 08/08/2020   Abscess of jaw 07/19/2020   Admission for palliative care 07/19/2020   Aortic valve stenosis 07/19/2020   Bacteremia 07/19/2020   Cognitive communication deficit 07/19/2020   Coronary artery disease involving coronary bypass graft of native heart without angina pectoris 07/19/2020   Debility 07/19/2020   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   Nonrheumatic mitral valve regurgitation 07/19/2020   Severe malnutrition (HCC) 07/19/2020   TIA (transient ischemic attack) 07/19/2020   Weight loss 07/19/2020   Acute on chronic heart failure with preserved ejection fraction (HCC) 07/08/2020   BPH (benign prostatic hyperplasia) 07/08/2020   Cellulitis of lower extremity 07/08/2020   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/08/2020   Elevated troponin 07/08/2020   Essential hypertension 07/08/2020   Hyperlipemia 07/08/2020   Normocytic anemia 07/08/2020   Thrombocytopenia (HCC) 07/08/2020   Chronic atrial fibrillation (HCC) 07/08/2020   Pressure injury of skin 05/12/2020   Generalized abdominal pain     Past Surgical History:  Procedure  Laterality Date   AORTIC VALVE REPLACEMENT     CORONARY ARTERY BYPASS GRAFT  2015   HERNIA REPAIR     REMOVAL OF IMPLANT  03/14/2020   TEE WITHOUT CARDIOVERSION N/A 05/13/2020   Procedure: TRANSESOPHAGEAL ECHOCARDIOGRAM (TEE);  Surgeon: Lewayne Bunting, MD;  Location: [facility] ENDOSCOPY;  Service: Cardiovascular;  Laterality: N/A;       Family History  Problem  Relation Age of Onset   CAD Neg Hx     Social History   Tobacco Use   Smoking status: Former    Packs/day: 3.00    Years: 47.00    Pack years: 141.00    Types: Cigarettes, Cigars    Start date: 1964    Quit date: 07/28/2010    Years since quitting: 10.9   Smokeless tobacco: Never  Vaping Use   Vaping Use: Never used  Substance Use Topics   Alcohol use: No   Drug use: No    Home Medications Prior to Admission medications   Medication Sig Start Date End Date Taking? Authorizing Provider  ammonium lactate (AMLACTIN) 12 % lotion Apply twice daily 06/22/21   Saguier, [name], PA-C  apixaban (ELIQUIS) 5 MG TABS tablet Take 1 tablet (5 mg total) by mouth in the morning and at bedtime. 11/09/20   Croitoru, Mihai, MD  carvedilol (COREG) 6.25 MG tablet TAKE 1 TABLET BY MOUTH 2 TIMES DAILY WITH A MEAL. 02/21/21   Croitoru, Mihai, MD  doxycycline (VIBRA-TABS) 100 MG tablet Take 1 tablet (100 mg total) by mouth 2 (two) times daily. 06/22/21   Saguier, [name], PA-C  furosemide (LASIX) 40 MG tablet TAKE 2 TABLETS BY MOUTH EVERY DAY 12/23/20   Croitoru, Mihai, MD  gabapentin (NEURONTIN) 100 MG capsule Take 1 capsule (100 mg total) by mouth at bedtime. 05/24/21   Saguier, [name], PA-C  Ginkgo Biloba 40 MG TABS Take 1 tablet by mouth daily.    [provider]  HEMP OIL-VANILLYL BUTYL ETHER EX Apply 1 application topically daily.    [provider]  hydrALAZINE (APRESOLINE) 50 MG tablet TAKE 1 TABLET BY MOUTH EVERY 8 HOURS. 12/23/20   Croitoru, Mihai, MD  HYDROcodone-acetaminophen (NORCO/VICODIN) 5-325 MG tablet Take 1 tablet by mouth every 6 (six) hours as needed for severe pain. 12/23/20   Wanda Plump, MD  hydrocortisone 2.5 % lotion Apply topically 2 (two) times daily. To rash on trunk 12/10/20   Terrilee Files, MD  ipratropium (ATROVENT) 0.02 % nebulizer solution Take 2.5 mLs (0.5 mg total) by nebulization in the morning, at noon, and at bedtime. 07/28/20   Hunsucker, Lesia Sago, MD   losartan (COZAAR) 100 MG tablet TAKE 1 TABLET BY MOUTH ONCE DAILY 06/22/21 06/22/22  Saguier, [name], PA-C  Multiple Vitamin (MULTIVITAMIN WITH MINERALS) TABS tablet Take 1 tablet by mouth daily.    [provider]  mupirocin ointment (BACTROBAN) 2 % Apply 1 application topically 2 (two) times daily. 12/17/20   Saguier, [name], PA-C  ondansetron (ZOFRAN ODT) 4 MG disintegrating tablet Take 1 tablet (4 mg total) by mouth every 8 (eight) hours as needed for nausea or vomiting. 06/22/21   Saguier, [name], PA-C  OXYGEN Inhale 2-3 sprays into the lungs every 4 (four) hours as needed (for shortness of breath).    [provider]  potassium chloride SA (KLOR-CON) 20 MEQ tablet Take 20 mEq by mouth daily with breakfast.    [provider]  triamcinolone lotion (KENALOG) 0.1 % Apply 1 application topically 3 (three) times daily.  12/23/20   Wanda Plump, MD  TURMERIC PO Take 1 tablet by mouth daily.    [provider]    Allergies    Chocolate, Chocolate flavor, Peanut oil, and Peanut-containing drug products  Review of Systems   Review of Systems  All other systems reviewed and are negative.  Physical Exam Updated Vital Signs BP (!) 152/95   Pulse (!) 58   Temp 97.7 F (36.5 C) (Temporal)   Resp 14   Ht  (1.88 m)   Wt 86.2 kg   SpO2 93%   BMI 24.39 kg/m   Physical Exam Vitals and nursing note reviewed.  Constitutional:      Appearance: He is well-developed. He is ill-appearing.  HENT:     Head: Normocephalic and atraumatic.  Cardiovascular:     Rate and Rhythm: Normal rate and regular rhythm.     Heart sounds: Murmur heard.  Pulmonary:     Effort: Pulmonary effort is normal. No respiratory distress.     Breath sounds: Normal breath sounds.  Abdominal:     Palpations: Abdomen is soft.     Tenderness: There is no abdominal tenderness. There is no guarding or rebound.  Musculoskeletal:        General: No tenderness.     Comments: Venous stasis  changes to BLE with 2+ pitting edema to BLE.  2+ DP pulses bilaterally.    Skin:    General: Skin is warm and dry.  Neurological:     Mental Status: He is alert.     Comments:  Right sided hemiparesis, left sided gaze preference.  Awake, alert. Oriented to place and recent events.  Psychiatric:        Behavior: Behavior normal.    ED Results / Procedures / Treatments   Labs (all labs ordered are listed, but only abnormal results are displayed) Labs Reviewed  PROTIME-INR - Abnormal; Notable for the following components:      Result Value   Prothrombin Time 17.8 (*)    INR 1.5 (*)    All other components within normal limits  APTT - Abnormal; Notable for the following components:   aPTT 40 (*)    All other components within normal limits  CBC - Abnormal; Notable for the following components:   RDW 18.9 (*)    Platelets 124 (*)    All other components within normal limits  I-STAT CHEM 8, ED - Abnormal; Notable for the following components:   BUN 30 (*)    Calcium, Ion 1.13 (*)    All other components within normal limits  RESP PANEL BY RT-PCR (FLU A&B, COVID) ARPGX2  DIFFERENTIAL  ETHANOL  COMPREHENSIVE METABOLIC PANEL  RAPID URINE DRUG SCREEN, HOSP PERFORMED  URINALYSIS, ROUTINE W REFLEX MICROSCOPIC  HEMOGLOBIN A1C  CBG MONITORING, ED    EKG None  Radiology CT Angio Head W or Wo Contrast  Result Date: 06/23/2021 CLINICAL DATA:  75 year old male code stroke presentation with acute left thalamic hemorrhage. EXAM: CT ANGIOGRAPHY HEAD AND NECK TECHNIQUE: Multidetector CT imaging of the head and neck was performed using the standard protocol during bolus administration of intravenous contrast. Multiplanar CT image reconstructions and MIPs were obtained to evaluate the vascular anatomy. Carotid stenosis measurements (when applicable) are obtained utilizing NASCET criteria, using the distal internal carotid diameter as the denominator. CONTRAST:  50mL OMNIPAQUE IOHEXOL 350 MG/ML  SOLN COMPARISON:  Plain head CT 0359 hours today.  Chest CT 08/03/2020. FINDINGS: CTA NECK Skeleton: Absent dentition. Small  metallic object embedded at the midline mandible symphysis. Prior sternotomy. No acute osseous abnormality identified. Upper chest: Stable chronic thickening along the superior left major fissure. Other mild pulmonary increased interstitial markings and mild dependent atelectasis appears stable. No superior mediastinal lymphadenopathy. A small volume of retained fluid is noted in the upper thoracic esophagus which is nondilated. Other neck: No acute finding, neck mass, or lymphadenopathy identified. Aortic arch: Tortuous aortic arch with extensive atherosclerosis. 3 vessel arch configuration. Right carotid system: Brachiocephalic artery plaque and tortuosity without stenosis. Mildly tortuous right CCA with mild plaque, no stenosis. Mild soft and calcified plaque at the right ICA origin and bulb without proximal right ICA stenosis. But in the upper neck at the C1 level there is partially calcified fusiform right ICA pseudoaneurysm extending about 2 cm in length (series 11, image 64) and up to 13 mm diameter. There is no associated stenosis. Left carotid system: Tortuous proximal left CCA with mild calcified plaque and no stenosis. Calcified plaque at the posterior left ICA origin without stenosis. Distal to the bulb there is a saccular pseudoaneurysm of the left ICA directed anteriorly and measuring up to 9 mm diameter, also about 9 mm in length. No associated stenosis. See series 6, image 92 and series 10, image 95. Vertebral arteries: Soft and calcified plaque in the proximal right subclavian artery without significant stenosis. Moderate calcified plaque at the right vertebral artery origin but only mild stenosis results. Additional right V1 and V2 segment calcified plaque with up to 50% stenosis at the C5-C6 level on series 9, image 244. The right vertebral remains patent to the skull base.  Mild to moderate proximal left subclavian plaque without significant stenosis. Soft and calcified plaque near the left vertebral artery origin with only mild stenosis. Left vertebral is non dominant and mildly tortuous in the neck but patent to the skull base without additional plaque or stenosis. CTA HEAD Posterior circulation: Dominant right vertebral artery. Extensive bilateral V4 calcified plaque (series 9, image 154) with moderate to severe bilateral V4 segment stenosis proximal to the vertebrobasilar junction. PICA origins and vertebrobasilar junction remain patent. The basilar artery is moderately calcified (series 9, image 115) but without hemodynamically significant stenosis. Bilateral SCA and PCA origins are patent without stenosis. Posterior communicating arteries are diminutive or absent. Right PCA branches are within normal limits. There is mild left PCA P3 segment irregularity on series 14, image 23 in proximity to the left thalamic hemorrhage. But there is no CTA spot sign, left PCA aneurysm or other significant stenosis. Anterior circulation: Both ICA siphons are patent but moderately calcified. Still, only mild siphon stenosis results. Patent carotid termini, MCA and ACA origins. Tortuous A1 segments, the right is mildly dominant. Diminutive or absent anterior communicating artery. ACA branches are within normal limits. Left MCA M1 segment and trifurcation are patent without stenosis. Left MCA branches are mildly irregular. Right MCA M1 segment and bifurcation are patent without stenosis. Right MCA branches are within normal limits. Venous sinuses: Early contrast timing, not well evaluated. Anatomic variants: Dominant right vertebral artery. Review of the MIP images confirms the above findings IMPRESSION: 1. Negative for LVO, CTA spot sign, or intracranial aneurysm in association with the left thalamic hemorrhage. There is mild left PCA P3 segment irregularity and stenosis. 2. Positive for bilateral  cervical ICA pseudoaneurysms, 13 x 20 mm on the right and 9 x 9 mm on the left. This raises the possibility of underlying Fibromuscular Dysplasia (FMD). There is no aneurism-associated stenosis. 3.  Positive also for extensive Calcified Atherosclerosis. 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 There is only mild ICA siphon and Basilar Artery stenosis. Aortic Atherosclerosis (ICD10-I70.0). 4. Partially visible chronic lung disease, prior CABG. Electronically Signed   By: Odessa Fleming M.D.   On: 06/23/2021 04:34   DG Chest 2 View  Result Date: 06/22/2021 CLINICAL DATA:  hx of chf. 9 lb weight gain since 06/13/2021. EXAM: CHEST - 2 VIEW COMPARISON:  02/25/202, FINDINGS: Unchanged cardiomediastinal silhouette with prior median sternotomy and CABG. Unchanged left atrial occlusion clip. There are diffuse interstitial opacities. Small bilateral pleural effusions. No visible pneumothorax. Bilateral shoulder degenerative changes. No acute osseous abnormality. Thoracic spondylosis. IMPRESSION: Mild pulmonary edema and small bilateral pleural effusions. Electronically Signed   By: Caprice Renshaw M.D.   On: 06/22/2021 11:11   CT Angio Neck W and/or Wo Contrast  Result Date: 06/23/2021 CLINICAL DATA:  75 year old male code stroke presentation with acute left thalamic hemorrhage. EXAM: CT ANGIOGRAPHY HEAD AND NECK TECHNIQUE: Multidetector CT imaging of the head and neck was performed using the standard protocol during bolus administration of intravenous contrast. Multiplanar CT image reconstructions and MIPs were obtained to evaluate the vascular anatomy. Carotid stenosis measurements (when applicable) are obtained utilizing NASCET criteria, using the distal internal carotid diameter as the denominator. CONTRAST:  75mL OMNIPAQUE IOHEXOL 350 MG/ML SOLN COMPARISON:  Plain head CT 0359 hours today.  Chest CT 08/03/2020. FINDINGS: CTA NECK Skeleton: Absent dentition. Small metallic object embedded at the  midline mandible symphysis. Prior sternotomy. No acute osseous abnormality identified. Upper chest: Stable chronic thickening along the superior left major fissure. Other mild pulmonary increased interstitial markings and mild dependent atelectasis appears stable. No superior mediastinal lymphadenopathy. A small volume of retained fluid is noted in the upper thoracic esophagus which is nondilated. Other neck: No acute finding, neck mass, or lymphadenopathy identified. Aortic arch: Tortuous aortic arch with extensive atherosclerosis. 3 vessel arch configuration. Right carotid system: Brachiocephalic artery plaque and tortuosity without stenosis. Mildly tortuous right CCA with mild plaque, no stenosis. Mild soft and calcified plaque at the right ICA origin and bulb without proximal right ICA stenosis. But in the upper neck at the C1 level there is partially calcified fusiform right ICA pseudoaneurysm extending about 2 cm in length (series 11, image 64) and up to 13 mm diameter. There is no associated stenosis. Left carotid system: Tortuous proximal left CCA with mild calcified plaque and no stenosis. Calcified plaque at the posterior left ICA origin without stenosis. Distal to the bulb there is a saccular pseudoaneurysm of the left ICA directed anteriorly and measuring up to 9 mm diameter, also about 9 mm in length. No associated stenosis. See series 6, image 92 and series 10, image 95. Vertebral arteries: Soft and calcified plaque in the proximal right subclavian artery without significant stenosis. Moderate calcified plaque at the right vertebral artery origin but only mild stenosis results. Additional right V1 and V2 segment calcified plaque with up to 50% stenosis at the C5-C6 level on series 9, image 244. The right vertebral remains patent to the skull base. Mild to moderate proximal left subclavian plaque without significant stenosis. Soft and calcified plaque near the left vertebral artery origin with only mild  stenosis. Left vertebral is non dominant and mildly tortuous in the neck but patent to the skull base without additional plaque or stenosis. CTA HEAD Posterior circulation: Dominant right vertebral artery. Extensive bilateral V4 calcified plaque (series 9, image 154) with moderate to severe  bilateral V4 segment stenosis proximal to the vertebrobasilar junction. PICA origins and vertebrobasilar junction remain patent. The basilar artery is moderately calcified (series 9, image 115) but without hemodynamically significant stenosis. Bilateral SCA and PCA origins are patent without stenosis. Posterior communicating arteries are diminutive or absent. Right PCA branches are within normal limits. There is mild left PCA P3 segment irregularity on series 14, image 23 in proximity to the left thalamic hemorrhage. But there is no CTA spot sign, left PCA aneurysm or other significant stenosis. Anterior circulation: Both ICA siphons are patent but moderately calcified. Still, only mild siphon stenosis results. Patent carotid termini, MCA and ACA origins. Tortuous A1 segments, the right is mildly dominant. Diminutive or absent anterior communicating artery. ACA branches are within normal limits. Left MCA M1 segment and trifurcation are patent without stenosis. Left MCA branches are mildly irregular. Right MCA M1 segment and bifurcation are patent without stenosis. Right MCA branches are within normal limits. Venous sinuses: Early contrast timing, not well evaluated. Anatomic variants: Dominant right vertebral artery. Review of the MIP images confirms the above findings IMPRESSION: 1. Negative for LVO, CTA spot sign, or intracranial aneurysm in association with the left thalamic hemorrhage. There is mild left PCA P3 segment irregularity and stenosis. 2. Positive for bilateral cervical ICA pseudoaneurysms, 13 x 20 mm on the right and 9 x 9 mm on the left. This raises the possibility of underlying Fibromuscular Dysplasia (FMD).  There is no aneurism-associated stenosis. 3. Positive also for extensive Calcified Atherosclerosis. 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 There is only mild ICA siphon and Basilar Artery stenosis. Aortic Atherosclerosis (ICD10-I70.0). 4. Partially visible chronic lung disease, prior CABG. Electronically Signed   By: Odessa Fleming M.D.   On: 06/23/2021 04:34   CT HEAD CODE STROKE WO CONTRAST  Addendum Date: 06/23/2021   ADDENDUM REPORT: 06/23/2021 04:34 ADDENDUM: Salient findings were communicated to Dr. Wilford Corner at 0416 hours on 06/23/2021 by text page via the [facility] messaging system. Electronically Signed   By: Odessa Fleming M.D.   On: 06/23/2021 04:34   Result Date: 06/23/2021 CLINICAL DATA:  Code stroke.  75 year old male. EXAM: CT HEAD WITHOUT CONTRAST TECHNIQUE: Contiguous axial images were obtained from the base of the skull through the vertex without intravenous contrast. COMPARISON:  Head CT 04/14/2014. FINDINGS: Brain: Oval and crescent-shaped hyperdense hemorrhage centered within the left thalamus encompasses 21 x 32 x 28 mm (AP by transverse by CC) for an estimated volume 9 mL. No intraventricular or extra-axial extension of blood is identified. There is surrounding edema but only mild regional mass effect. A small focus of hemorrhage tracks into the left midbrain (series 5, image 41). Elsewhere patchy bilateral cerebral white matter hypodensity and a chronic appearing lacunar infarct of the left basal ganglia have progressed since 2015. Overall cerebral volume has not significantly changed. No midline shift, ventriculomegaly, or evidence of cortically based acute infarction. Vascular: Extensive Calcified atherosclerosis at the skull base. There is a degree of generalized intracranial artery tortuosity. No suspicious intracranial vascular hyperdensity. Skull: Stable and intact. Sinuses/Orbits: Visualized paranasal sinuses and mastoids are stable and well aerated. Other: There is  scattered scalp vessel calcified atherosclerosis. No acute orbit or scalp soft tissue finding. ASPECTS [facility] Stroke Program Early CT Score) Total score (0-10 with 10 being normal): Not applicable, acute hemorrhage. IMPRESSION: 1. Acute hemorrhage centered in the left thalamus with an estimated volume of 9 mL. No intraventricular or extra-axial extension of blood. Surrounding edema but only mild  regional mass effect. 2. 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 3. Generalized intracranial artery tortuosity. Electronically Signed: By: Odessa FlemingH  Hall M.D. On: 06/23/2021 04:18    Procedures Procedures  CRITICAL CARE Performed by: Tilden FossaElizabeth Estus Krakowski   Total critical care time: 35 minutes  Critical care time was exclusive of separately billable procedures and treating other patients.  Critical care was necessary to treat or prevent imminent or life-threatening deterioration.  Critical care was time spent personally by me on the following activities: development of treatment plan with patient and/or surrogate as well as nursing, discussions with consultants, evaluation of patient's response to treatment, examination of patient, obtaining history from patient or surrogate, ordering and performing treatments and interventions, ordering and review of laboratory studies, ordering and review of radiographic studies, pulse oximetry and re-evaluation of patient's condition.  Medications Ordered in ED Medications   stroke: mapping our early stages of recovery book (has no administration in time range)  acetaminophen (TYLENOL) tablet 650 mg (has no administration in time range)    Or  acetaminophen (TYLENOL) 160 MG/5ML solution 650 mg (has no administration in time range)    Or  acetaminophen (TYLENOL) suppository 650 mg (has no administration in time range)  senna-docusate (Senokot-S) tablet 1 tablet (has no administration in time range)  pantoprazole (PROTONIX) injection 40  mg (has no administration in time range)  labetalol (NORMODYNE) injection 20 mg (has no administration in time range)    And  clevidipine (CLEVIPREX) infusion 0.5 mg/mL (4 mg/hr Intravenous Rate/Dose Change 06/23/21 0430)  ondansetron (ZOFRAN) injection 4 mg (4 mg Intravenous Given 06/23/21 0431)  coag fact Xa recombinant (ANDEXXA) low dose infusion 900 mg (900 mg Intravenous New Bag/Given 06/23/21 0434)  iohexol (OMNIPAQUE) 350 MG/ML injection 50 mL (50 mLs Intravenous Contrast Given 06/23/21 0417)    ED Course  I have reviewed the triage vital signs and the nursing notes.  Pertinent labs & imaging results that were available during my care of the patient were reviewed by me and considered in my medical decision making (see chart for details).    MDM Rules/Calculators/A&P                          patient presented to the emergency department as a code stroke. He was evaluated on ED arrival by neurology as well as myself. He was transferred emergently to the CT scanner, which demonstrated intraparenchymal hemorrhage. He does take eloquence, last dose was presumed to be last night. Plan to reverse within andexxa, start on cleviprex for blood pressure control and admit to neuro- ICU for ongoing treatment.  Final Clinical Impression(s) / ED Diagnoses Final diagnoses:  Intracranial hemorrhage [facility](HCC)    Rx / DC Orders ED Discharge Orders     None        Tilden Fossaees, Ashanta Amoroso, MD 06/23/21 [phone]

## 2021-06-23 NOTE — Progress Notes (Signed)
  Echocardiogram 2D Echocardiogram has been performed.  Carlos Welch 06/23/2021, 2:13 PM

## 2021-06-23 NOTE — H&P (Addendum)
ICH H&P  CC: right sided weakness, confusion  History is obtained from: Chart, patient  HPI: Carlos Welch is a 75 y.o. male past medical history of atrial fibrillation, on Eliquis, aortic stenosis, coronary artery disease, bilateral lower extremity cellulitis, combined CHF, essential hypertension, hyperlipidemia, thrombocytopenia, presented to the emergency room via EMS when the family called EMS for evaluation of abrupt onset of right-sided weakness and numbness.  The patient was last seen well at 8 PM on 06/22/2021 when he went to bed.  He was noted sometime in the middle of the night to be weak on the right side and have slurred speech.  EMTs also felt that he was a little confused and unable to answer all questions appropriately.  LVO positive code stroke was activated. He was seen and evaluated emergently on the ER bridge-symptoms are consistent with right hemiparesis and right-sided sensory loss.  Noncontrasted head CT showed left thalamic bleed around 9 cc without IVH. CT angiography with no vascular malformation and delayed images negative for spot sign Likely hypertensive bleed due to coagulopathy from Eliquis and hypertension    I was able to speak with her daughter Blanchie Serve over the phone.  She reports that he is very noncompliant with his medications.  He takes what ever he likes.  She was concerned that he was taking excessive gingerroot which she knows is a potent blood thinner and might have contributed to this.  LKW: 8 PM on 06/22/2021 tpa given?: no, ICH Anticoagulation Reversed with Andexxa Premorbid modified Rankin scale (mRS): 2  ROS: Full ROS was performed and is negative except as noted in the HPI.   Past Medical History:  Diagnosis Date   A-fib (HCC)    Abscess of jaw 07/19/2020   Acute on chronic heart failure with preserved ejection fraction (HCC) 07/08/2020   Aortic stenosis    a. s/p pericardial AVR 2015.   Aortic valve stenosis 07/19/2020   Bacteremia 07/19/2020    Bacterial endocarditis    BPH (benign prostatic hyperplasia) 07/08/2020   CAD (coronary artery disease)    a. s/p CABG, AVR, LAA clipping 2015 at [facility].   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/08/2020   Elevated troponin 07/08/2020   Essential hypertension 07/08/2020   Generalized abdominal pain    History of noncompliance with medical treatment    Hyperlipemia 07/08/2020   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   Malnutrition (HCC)    MRSA bacteremia    Nonrheumatic mitral valve regurgitation 07/19/2020   Normocytic anemia 07/08/2020   Pressure injury of skin 05/12/2020   S/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 2015   Severe malnutrition (HCC) 07/19/2020   Thrombocytopenia (HCC) 07/08/2020   TIA (transient ischemic attack)    Weight loss 07/19/2020   Family History  Problem Relation Age of Onset   CAD Neg Hx      Social History:   reports that he quit smoking about 10 years ago. His smoking use included cigarettes and cigars. He started smoking about 58 years ago. He has a 141.00 pack-year smoking history. He has never used smokeless tobacco. He reports that he does not drink alcohol  and does not use drugs.  Medications  Current Facility-Administered Medications:    coag fact Xa recombinant (ANDEXXA) low dose infusion 900 mg, 900 mg, Intravenous, Once, Milon Dikes, MD  Current Outpatient Medications:    ammonium lactate (AMLACTIN) 12 % lotion, Apply twice daily, Disp: 225 g, Rfl: 0   apixaban (ELIQUIS) 5 MG TABS tablet, Take 1 tablet (5 mg total) by mouth in the morning and at  bedtime., Disp: 60 tablet, Rfl: 6   carvedilol (COREG) 6.25 MG tablet, TAKE 1 TABLET BY MOUTH 2 TIMES DAILY WITH A MEAL., Disp: 180 tablet, Rfl: 2   doxycycline (VIBRA-TABS) 100 MG tablet, Take 1 tablet (100 mg total) by mouth 2 (two) times daily., Disp: 20 tablet, Rfl: 0   furosemide (LASIX) 40 MG tablet, TAKE 2 TABLETS BY MOUTH EVERY DAY, Disp: 60 tablet, Rfl: 3   gabapentin (NEURONTIN) 100 MG capsule, Take 1 capsule (100 mg total) by mouth at bedtime., Disp: 30 capsule, Rfl: 0   Ginkgo Biloba 40 MG TABS, Take 1 tablet by mouth daily., Disp: , Rfl:    HEMP OIL-VANILLYL BUTYL ETHER EX, Apply 1 application topically daily., Disp: , Rfl:    hydrALAZINE (APRESOLINE) 50 MG tablet, TAKE 1 TABLET BY MOUTH EVERY 8 HOURS., Disp: 90 tablet, Rfl: 3   HYDROcodone-acetaminophen (NORCO/VICODIN) 5-325 MG tablet, Take 1 tablet by mouth every 6 (six) hours as needed for severe pain., Disp: 12 tablet, Rfl: 0   hydrocortisone 2.5 % lotion, Apply topically 2 (two) times daily. To rash on trunk, Disp: 59 mL, Rfl: 0   ipratropium (ATROVENT) 0.02 % nebulizer solution, Take 2.5 mLs (0.5 mg total) by nebulization in the morning, at noon, and at bedtime., Disp: 240 mL, Rfl: 11   losartan (COZAAR) 100 MG tablet, TAKE 1 TABLET BY MOUTH ONCE DAILY, Disp: 90 tablet, Rfl: 3   Multiple Vitamin (MULTIVITAMIN WITH MINERALS) TABS tablet, Take 1 tablet by mouth daily., Disp: , Rfl:    mupirocin ointment (BACTROBAN) 2 %, Apply 1 application topically 2 (two) times daily., Disp: 22 g, Rfl: 2   ondansetron (ZOFRAN ODT) 4 MG disintegrating tablet, Take 1 tablet (4 mg total) by mouth every 8 (eight) hours as needed for nausea or vomiting., Disp: 20 tablet, Rfl: 0   OXYGEN, Inhale 2-3 sprays into the lungs every 4 (four) hours as needed (for shortness of breath)., Disp: , Rfl:    potassium chloride SA (KLOR-CON) 20 MEQ tablet, Take 20 mEq by mouth daily with breakfast., Disp: , Rfl:    triamcinolone lotion (KENALOG) 0.1 %, Apply 1  application topically 3 (three) times daily., Disp: 120 mL, Rfl: 0   TURMERIC PO, Take 1 tablet by mouth daily., Disp: , Rfl:    Exam: Current vital signs: BP (!) 187/97 (BP Location: Left Arm)   Pulse 64   Temp 97.7 F (36.5 C) (Temporal)   Resp 14   SpO2 96%  Vital signs in last 24 hours: Temp:  [97.7 F (36.5 C)-98.5 F (36.9 C)] 97.7 F (36.5 C) (09/08 0354) Pulse Rate:  [60-64] 64 (09/08 0354) Resp:  [14] 14 (09/08 0354) BP: (150-187)/(80-101) 187/97 (09/08 0354) SpO2:  [96 %-99 %] 96 % (09/08 0354) Weight:  [81.6 kg] 81.6 kg (09/07 0940) General: Awake alert in no distress HEENT: Normocephalic/atraumatic Lungs: Clear Cardiovascular: Irregularly irregular Abdomen nondistended nontender Extremities: Extensive cellulitis Neurological exam Awake alert oriented to self Unable to tell me that he is in the hospital Unable to tell me the date or time  Speech is mildly dysarthric Poor attention concentration Question some word finding difficulty Cranial nerves: Pupils equal round react light, extraocular movements appear unhindered but he does seem to have somewhat of a left gaze preference at rest, blink to threat inconsistent, right lower facial weakness observed, tongue and palate midline. Motor examination with flaccid right upper extremity and minimal 1/5 movement in the right lower extremity.  Left upper and lower extremity are normal Sensation: Complete sensory loss on the right hemibody. Coordination: No gross dysmetria on the left, unable to follow on the right NIHSS 1a Level of Conscious.: 0 1b LOC Questions: 2 1c LOC Commands:0 2 Best Gaze: 1 3 Visual: 0 4 Facial Palsy: 2 5a Motor Arm - left: 0 5b Motor Arm - Right: 4 6a Motor Leg - Left: 0 6b Motor Leg - Right: 3 7 Limb Ataxia: 0 8 Sensory: 2 9 Best Language: 1 10 Dysarthria: 1 11 Extinct. and Inatten.: 0 TOTAL: 16    Labs I have reviewed labs in epic and the results pertinent to this consultation  are:  CBC    Component Value Date/Time   WBC 4.1 06/23/2021 0355   RBC 5.05 06/23/2021 0355   HGB 15.0 06/23/2021 0401   HCT 44.0 06/23/2021 0401   PLT 124 (L) 06/23/2021 0355   MCV 89.7 06/23/2021 0355   MCH 27.9 06/23/2021 0355   MCHC 31.1 06/23/2021 0355   RDW 18.9 (H) 06/23/2021 0355   LYMPHSABS 0.8 06/23/2021 0355   MONOABS 0.5 06/23/2021 0355   EOSABS 0.1 06/23/2021 0355   BASOSABS 0.0 06/23/2021 0355    CMP     Component Value Date/Time   NA 139 06/23/2021 0401   NA 143 08/03/2020 1103   K 3.9 06/23/2021 0401   CL 106 06/23/2021 0401   CO2 31 06/22/2021 1032   GLUCOSE 86 06/23/2021 0401   BUN 30 (H) 06/23/2021 0401   BUN 23 08/03/2020 1103   CREATININE 1.10 06/23/2021 0401   CREATININE 0.93 07/07/2020 1033   CALCIUM 10.0 06/22/2021 1032   PROT 7.5 06/22/2021 1032   ALBUMIN 4.1 06/22/2021 1032   AST 22 06/22/2021 1032   ALT 12 06/22/2021 1032   ALKPHOS 125 (H) 06/22/2021 1032   BILITOT 1.4 (H) 06/22/2021 1032   GFRNONAA >60 12/10/2020 1552   GFRAA 61 08/03/2020 1103   Imaging I have reviewed the images obtained:  CT-head-9 cc left thalamic bleed.  Surrounding edema but only mild regional mass-effect. CT angio head and neck negative for LVO or CTA spot sign or intracranial aneurysm in association with the thalamic bleed. Mild left P3 irregularity and stenosis Positive for bilateral cervical ICA pseudoaneurysms 13 x 20 mm on the right and 9 x 9 mm on the left-raising possibility of underlying FMD.  No associated stenosis. Positive also for extensive calcified atherosclerosis.  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  There is only mild ICA siphon and basilar artery stenosis.  There is aortic atherosclerosis. Partially visible chronic lung disease and prior CABG.  Assessment:  75 year old with sudden onset of right-sided diminished sensation and weakness along with elevated blood pressures.  On Eliquis for atrial  fibrillation. Noted to have a left thalamic bleed-likely coagulopathic bleed in the setting of hypertension.    Acute subcortical left thalamic intracerebral hemorrhage Coagulopathy secondary to anticoagulation Chronic thrombocytopenia Coronary artery disease Hypertensive emergency Essential hypertension   Plan: Subcortical ICH, nontraumatic  Acuity: Acute Laterality: Left thalamus Current suspected etiology: Hypertension, coagulopathy due to anticoagulation Treatment: -  Admit to ICU -ICH Score: 0 -ICH Volume: 9cc -BP control goal SYS<140 -PT/OT/ST  -neuromonitoring -Repeat CT head at 10 AM for stability.- -MRI brain with and without contrast   CNS Cerebral edema-no midline shift. -Close neuro monitoring   Dysarthria Dysphagia following ICH  -NPO until cleared by speech -ST -Advance diet as tolerated    Hemiplegia and hemiparesis following nontraumatic intracerebral hemorrhage affecting right dominant side  -Continue PT/OT/ST  RESP No active issues Check chest x-ray to rule out aspiration  CV Hypertensive Encephalopathy Essential (primary) hypertension Hypertensive Emergency -Aggressive BP control, goal SBP < 140 -Cleviprex drip  Combined heart failure -TTE  Chronic atrial -fibrillation -Rate control -No antiplatelet or anticoagulant given acute bleed  GI/GU No active issues Gentle hydration   HEME Coagulopathy secondary to anticoagulation Thrombocytopenia -Reverse with Andexxa -Check labs.  Platelets 124-no need for platelet transfusion.  Might need transfusion if platelet count drops below 100k  ENDO No active issues Check A1c  Fluid/Electrolyte Disorders No active issues Check labs in the morning Replete electrolytes as necessary  ID Possible Aspiration PNA -CXR -NPO -Monitor  Bilateral lower extremity cellulitis/chronic venous stasis changes -WC consult   Prophylaxis DVT: SCDs only GI: PPI Bowel: Docusate  senna  Dispo: IP Rehab  Diet: NPO until cleared by speech/bedside swallow screen  Code Status: Full Code-the daughter said that he had some advance directives but she is not clear on what he meant why those.  He did not want to live prolonged on artificial means but she could not really give me a clear answer on her father's wishes for her his current CODE STATUS and for that reason it will be left as full code.  This discussion needs to be had in person with her in the morning.   THE FOLLOWING WERE PRESENT ON ADMISSION: Intracerebral hemorrhage, cerebral edema, hemiplegia, probable aspiration pneumonia, chronic diastolic and systolic CHF, hypertensive emergency, hypertensive encephalopathy, bilateral lower extremity cellulitis, coagulopathy due to chronic anticoagulation  -- Milon Dikes, MD Neurologist [facility] Pager: [phone]   CRITICAL CARE ATTESTATION Performed by: Milon Dikes, MD Total critical care time: 45 minutes Critical care time was exclusive of separately billable procedures and treating other patients and/or supervising APPs/Residents/Students Critical care was necessary to treat or prevent imminent or life-threatening deterioration due to intracerebral hemorrhage, hypertensive emergency, coagulopathy due to anticoagulation requiring anticoagulant reversal. This patient is critically ill and at significant risk for neurological worsening and/or death and care requires constant monitoring. Critical care was time spent personally by me on the following activities: development of treatment plan with patient and/or surrogate as well as nursing, discussions with consultants, evaluation of patient's response to treatment, examination of patient, obtaining history from patient or surrogate, ordering and performing treatments and interventions, ordering and review of laboratory studies, ordering and review of radiographic studies, pulse oximetry, re-evaluation of  patient's condition, participation in multidisciplinary rounds and medical decision making of high complexity in the care of this patient.

## 2021-06-23 NOTE — ED Notes (Signed)
Patient transported to CT 

## 2021-06-23 NOTE — TOC CAGE-AID Note (Signed)
Transition of Care [facility]) - CAGE-AID Screening   Patient Details  Name: Carlos Welch MRN: [id number] Date of Birth: August 12, 1946  Transition of Care [facility]) CM/SW Contact:    Erin Sons, LCSW Phone Number: 06/23/2021, 10:36 AM   Clinical Narrative:  Pt currently unable to participate in CAGE-AID  CAGE-AID Screening: Substance Abuse Screening unable to be completed due to: : Patient unable to participate             Substance Abuse Education Offered: No

## 2021-06-24 ENCOUNTER — Inpatient Hospital Stay (HOSPITAL_COMMUNITY): Payer: Medicare Other

## 2021-06-24 DIAGNOSIS — Z7901 Long term (current) use of anticoagulants: Secondary | ICD-10-CM

## 2021-06-24 DIAGNOSIS — L03119 Cellulitis of unspecified part of limb: Secondary | ICD-10-CM

## 2021-06-24 DIAGNOSIS — I161 Hypertensive emergency: Secondary | ICD-10-CM

## 2021-06-24 DIAGNOSIS — I482 Chronic atrial fibrillation, unspecified: Secondary | ICD-10-CM

## 2021-06-24 LAB — CBC
HCT: 41 % (ref 39.0–52.0)
Hemoglobin: 12.9 g/dL — ABNORMAL LOW (ref 13.0–17.0)
MCH: 27.7 pg (ref 26.0–34.0)
MCHC: 31.5 g/dL (ref 30.0–36.0)
MCV: 88 fL (ref 80.0–100.0)
Platelets: 114 10*3/uL — ABNORMAL LOW (ref 150–400)
RBC: 4.66 MIL/uL (ref 4.22–5.81)
RDW: 18.4 % — ABNORMAL HIGH (ref 11.5–15.5)
WBC: 4.5 10*3/uL (ref 4.0–10.5)
nRBC: 0 % (ref 0.0–0.2)

## 2021-06-24 LAB — LIPID PANEL
Cholesterol: 143 mg/dL (ref 0–200)
HDL: 37 mg/dL — ABNORMAL LOW (ref >40)
LDL Cholesterol: 92 mg/dL (ref 0–99)
Total CHOL/HDL Ratio: 3.9 RATIO
Triglycerides: 72 mg/dL (ref <150)
VLDL: 14 mg/dL (ref 0–40)

## 2021-06-24 LAB — BASIC METABOLIC PANEL
Anion gap: 11 (ref 5–15)
BUN: 21 mg/dL (ref 8–23)
CO2: 28 mmol/L (ref 22–32)
Calcium: 9.2 mg/dL (ref 8.9–10.3)
Chloride: 99 mmol/L (ref 98–111)
Creatinine, Ser: 1.34 mg/dL — ABNORMAL HIGH (ref 0.61–1.24)
GFR, Estimated: 56 mL/min — ABNORMAL LOW (ref >60)
Glucose, Bld: 75 mg/dL (ref 70–99)
Potassium: 3.9 mmol/L (ref 3.5–5.1)
Sodium: 138 mmol/L (ref 135–145)

## 2021-06-24 MED ORDER — GADOBUTROL 1 MMOL/ML IV SOLN
7.5000 mL | Freq: Once | INTRAVENOUS | Status: AC | PRN
  Administered 2021-06-24: 7.5 mL via INTRAVENOUS

## 2021-06-24 MED ORDER — HYDRALAZINE HCL 50 MG PO TABS
50.0000 mg | ORAL_TABLET | Freq: Three times a day (TID) | ORAL | Status: DC
  Administered 2021-06-24 – 2021-06-27 (×9): 50 mg via ORAL
  Filled 2021-06-24 (×9): qty 1

## 2021-06-24 MED ORDER — ORAL CARE MOUTH RINSE
15.0000 mL | Freq: Two times a day (BID) | OROMUCOSAL | Status: DC
  Administered 2021-06-24 – 2021-06-27 (×7): 15 mL via OROMUCOSAL

## 2021-06-24 NOTE — Evaluation (Signed)
Physical Therapy Evaluation Patient Details Name: Carlos Welch MRN: [id number] DOB: 1945-12-09 Today's Date: 06/24/2021   History of Present Illness  75 y.o. male admitted with ICH L thalamic R side weakness and numbness PMH HF aortic valve stenosis s/p valve replacement BPH afib cad s/p CABG HTn thrombocytopenia,HLD,  TIA mitral valvle regurgitation bil LE cellulitis,  Clinical Impression  Patient presents with decreased mobility due to R side weakness, decreased sensation, decreased balance, decreased R side awareness, impaired vision and impaired speech and swallowing.  He will benefit from skilled PT in the acute setting and from follow up CIR level rehab prior to d/c home.  Currently needing +2 A for safety in standing and transfers to chair.  Previously was walking independent with a cane.    Follow Up Recommendations CIR;Supervision/Assistance - 24 hour    Equipment Recommendations  Other (comment) (TBA)    Recommendations for Other Services       Precautions / Restrictions Precautions Precautions: Fall Precaution Comments: R inattention, decr R sensation      Mobility  Bed Mobility Overal bed mobility: Needs Assistance Bed Mobility: Supine to Sit     Supine to sit: Mod assist     General bed mobility comments: up to EOB with OT assist    Transfers Overall transfer level: Needs assistance Equipment used: Rolling walker (2 wheeled) Transfers: Sit to/from [employer] Transfers Sit to Stand: +2 physical assistance;Max assist Stand pivot transfers: +2 physical assistance;Max assist       General transfer comment: pt powered up into standing +2 mod first attempt. pt on second attempt needs more cues and (A). pt aware of R side numbness and perseverating on this fact. pt reports seeing two so matched used during transfer as glasses are not available at this time.  Ambulation/Gait Ambulation/Gait assistance: +2 physical assistance;Max assist Gait Distance  (Feet): 1 Feet Assistive device: Rolling walker (2 wheeled) Gait Pattern/deviations: Decreased dorsiflexion - right;Ataxic;Staggering right     General Gait Details: step forward then back with difficulty finding R LE and placing on the floor with LOB to R so returned to sitting and then pivot to chair  Stairs            Wheelchair Mobility    Modified Rankin (Stroke Patients Only) Modified Rankin (Stroke Patients Only) Pre-Morbid Rankin Score: No significant disability Modified Rankin: Severe disability     Balance Overall balance assessment: Needs assistance Sitting-balance support: Bilateral upper extremity supported;Feet supported Sitting balance-Leahy Scale: Fair Sitting balance - Comments: R lateral lean and needs cues to self correct   Standing balance support: Bilateral upper extremity supported;During functional activity Standing balance-Leahy Scale: Poor Standing balance comment: A for balance with pt leaning and at times pushed to the R                             Pertinent Vitals/Pain Pain Assessment: Faces Faces Pain Scale: No hurt    Home Living Family/patient expects to be discharged to:: Private residence Living Arrangements: Children Available Help at Discharge: Family;Available 24 hours/day Type of Home: House Home Access: Stairs to enter     Home Layout: One level Home Equipment: Grab bars - tub/shower;Hand held shower head;Cane - single point;Walker - 2 wheels Additional Comments: information from prior admission and from patient report    Prior Function Level of Independence: Needs assistance   Gait / Transfers Assistance Needed: walks with cane  ADL's / Homemaking Assistance  Needed: reports dressing and bathing himself prior to this admission        Hand Dominance   Dominant Hand: Right    Extremity/Trunk Assessment   Upper Extremity Assessment Upper Extremity Assessment: Defer to OT evaluation    Lower Extremity  Assessment Lower Extremity Assessment: RLE deficits/detail;LLE deficits/detail RLE Deficits / Details: AROM WFL, strength hip flexion 3-/5, knee extension 3+/5, ankle DF 3/5, limited due to poor awareness/proprioception RLE Sensation: decreased proprioception;decreased light touch RLE Coordination: decreased gross motor LLE Deficits / Details: AROM WFL, strength at least 3+ to 4/5    Cervical / Trunk Assessment Cervical / Trunk Assessment: Kyphotic;Other exceptions Cervical / Trunk Exceptions: R weight shifted, elongated trunk on R, R lateral and posterior pelvic tilt  Communication   Communication: Expressive difficulties  Cognition Arousal/Alertness: Awake/alert Behavior During Therapy: Flat affect Overall Cognitive Status: Impaired/Different from baseline Area of Impairment: Orientation;Attention;Memory;Following commands;Awareness;Safety/judgement;Problem solving                 Orientation Level: Disoriented to;Situation Current Attention Level: Selective Memory: Decreased short-term memory;Decreased recall of precautions Following Commands: Follows one step commands consistently   Awareness: Intellectual   General Comments: pt reports living with pat sister and when asked about heather reports sister. OT states it says she is your daughter pt states sister / daughter whatever you want to call her we are related. pt unable to clearly define heather's relationship to patient. pt gives generalization.      General Comments General comments (skin integrity, edema, etc.): BP 164/103 after up to chair, on 3L O2; RN aware    Exercises     Assessment/Plan    PT Assessment Patient needs continued PT services  PT Problem List Decreased strength;Decreased mobility;Decreased safety awareness;Decreased balance;Decreased knowledge of use of DME;Impaired sensation;Decreased cognition;Decreased activity tolerance;Decreased coordination;Decreased knowledge of precautions       PT  Treatment Interventions DME instruction;Therapeutic activities;Cognitive remediation;Gait training;Patient/family education;Therapeutic exercise;Balance training;Functional mobility training;Neuromuscular re-education;Stair training    PT Goals (Current goals can be found in the Care Plan section)  Acute Rehab PT Goals Patient Stated Goal: to be able to feel R side PT Goal Formulation: With patient Time For Goal Achievement: 07/08/21 Potential to Achieve Goals: Good    Frequency Min 4X/week   Barriers to discharge Decreased caregiver support      Co-evaluation PT/OT/SLP Co-Evaluation/Treatment: Yes Reason for Co-Treatment: To address functional/ADL transfers;Necessary to address cognition/behavior during functional activity;For patient/therapist safety PT goals addressed during session: Mobility/safety with mobility;Balance;Proper use of DME OT goals addressed during session: ADL's and self-care;Proper use of Adaptive equipment and DME;Strengthening/ROM       AM-PAC PT "6 Clicks" Mobility  Outcome Measure Help needed turning from your back to your side while in a flat bed without using bedrails?: A Lot Help needed moving from lying on your back to sitting on the side of a flat bed without using bedrails?: A Lot Help needed moving to and from a bed to a chair (including a wheelchair)?: Total Help needed standing up from a chair using your arms (e.g., wheelchair or bedside chair)?: Total Help needed to walk in hospital room?: Total Help needed climbing 3-5 steps with a railing? : Total 6 Click Score: 8    End of Session Equipment Utilized During Treatment: Gait belt Activity Tolerance: Patient limited by fatigue Patient left: in chair;with call bell/phone within reach;with chair alarm set Nurse Communication: Mobility status PT Visit Diagnosis: Other abnormalities of gait and mobility (R26.89);Other symptoms and signs involving  the nervous system (R29.898);Hemiplegia and  hemiparesis Hemiplegia - Right/Left: Right Hemiplegia - dominant/non-dominant: Dominant Hemiplegia - caused by: Nontraumatic intracerebral hemorrhage    Time: 0935-1001 PT Time Calculation (min) (ACUTE ONLY): 26 min   Charges:   PT Evaluation $PT Eval Moderate Complexity: 1 Mod          Cyndi Chanette Demo, PT Acute Rehabilitation Services Pager:[phone] Office:[phone] 06/24/2021   Elray Mcgregor 06/24/2021, 11:23 AM

## 2021-06-24 NOTE — Progress Notes (Signed)
Occupational Therapy Evaluation Patient Details Name: Carlos Welch MRN: [id number] DOB: 07-06-1946 Today's Date: 06/24/2021    History of Present Illness 75 y.o. male admitted with ICH L thalamic R side weakness and numbness PMH HF aortic valve stenosis s/p valve replacement BPH afib cad s/p CABG HTn thrombocytopenia,HLD,  TIA mitral valvle regurgitation bil LE cellulitis,   Clinical Impression   PT admitted with ICH L thalamic CVA. Pt currently with functional limitiations due to the deficits listed below (see OT problem list). Pt with diplopia at this time and given a patch for basic transfer as we acquire glasses for occlusion trials next session. Pt currently requires total +2 max (A) for transfer toward L side with visual cues. Pt very aware of R side numbness. Pt will benefit from skilled OT to increase their independence and safety with adls and balance to allow discharge CIR.     Follow Up Recommendations  CIR    Equipment Recommendations  Other (comment) (TBA)    Recommendations for Other Services Rehab consult     Precautions / Restrictions Precautions Precautions: Fall Precaution Comments: R inattention purewick male      Mobility Bed Mobility Overal bed mobility: Needs Assistance Bed Mobility: Supine to Sit     Supine to sit: Mod assist     General bed mobility comments: exiting on left side. pt needs max cues    Transfers Overall transfer level: Needs assistance Equipment used: Rolling walker (2 wheeled) Transfers: Sit to/from [employer] Sit to Stand: +2 physical assistance;Max assist Stand pivot transfers: +2 physical assistance;Max assist       General transfer comment: pt powered up into standing +2 mod first attempt. pt on second attempt needs more cues and (A). pt aware of R side numbness and perseverating on this fact. pt reports seeing two so matched used during transfer as glasses are not available at this time.    Balance  Overall balance assessment: Needs assistance Sitting-balance support: Bilateral upper extremity supported;Feet supported Sitting balance-Leahy Scale: Fair Sitting balance - Comments: R lateral lean and needs cues to self coorrect   Standing balance support: Bilateral upper extremity supported;During functional activity Standing balance-Leahy Scale: Poor Standing balance comment: needs (A) to elongate R side                           ADL either performed or assessed with clinical judgement   ADL Overall ADL's : Needs assistance/impaired Eating/Feeding: Maximal assistance;Bed level Eating/Feeding Details (indicate cue type and reason): pt verbalized being hungry but does not initiate self feeding. pt needs cues to clear R side of mouth throughout session. pt with dentures Grooming: Moderate assistance;Sitting   Upper Body Bathing: Maximal assistance   Lower Body Bathing: Total assistance   Upper Body Dressing : Maximal assistance   Lower Body Dressing: Total assistance   Toilet Transfer: +2 for physical assistance;Maximal assistance             General ADL Comments: pt with strong R lean and pushing with L UE     Vision Baseline Vision/History: 1 Wears glasses (reading only) Vision Assessment?: Yes Eye Alignment: Within Functional Limits Tracking/Visual Pursuits: Impaired - to be further tested in functional context;Requires cues, head turns, or add eye shifts to track Diplopia Assessment: Present all the time/all directions Additional Comments: isolated eye reports monocular vision     Perception Perception Perception Tested?: Yes Perception Deficits: Inattention/neglect Inattention/Neglect: Does not attend to right  visual field   Praxis      Pertinent Vitals/Pain Pain Assessment: Faces Faces Pain Scale: No hurt     Hand Dominance Right   Extremity/Trunk Assessment Upper Extremity Assessment Upper Extremity Assessment: Generalized weakness    Lower Extremity Assessment Lower Extremity Assessment: Defer to PT evaluation       Communication Communication Communication: Expressive difficulties   Cognition Arousal/Alertness: Awake/alert Behavior During Therapy: Flat affect Overall Cognitive Status: Impaired/Different from baseline Area of Impairment: Orientation;Attention;Memory;Following commands;Awareness;Safety/judgement;Problem solving                 Orientation Level: Disoriented to;Situation Current Attention Level: Selective Memory: Decreased short-term memory;Decreased recall of precautions (poor awareness to pocketing food R side of mouth) Following Commands: Follows one step commands consistently   Awareness: Intellectual   General Comments: pt reports living with pat sister and when asked about heather reports sister. OT states it says she is your daughter pt states sister / daughter whatever you want to call her we are related. pt unable to clearly define heather's relationship to patient. pt gives generalization.   General Comments  BP 164/103 after movement sitting in chair   3L oxygen    Exercises     Shoulder Instructions      Home Living Family/patient expects to be discharged to:: Private residence Living Arrangements: Children (sister Dennie Bible reports living with sister but reports heather is sister chart states Daughter) Available Help at Discharge: Family;Available 24 hours/day Type of Home: House Home Access: Stairs to enter     Home Layout: One level     Bathroom Shower/Tub: Producer, television/film/video: Standard     Home Equipment: Grab bars - tub/shower;Hand held shower head;Cane - single point;Walker - 2 wheels   Additional Comments: information from prior admission and from patient report      Prior Functioning/Environment Level of Independence: Needs assistance  Gait / Transfers Assistance Needed: walks with cane ADL's / Homemaking Assistance Needed: reports dressing  and bathing himself prior to this admission Communication / Swallowing Assistance Needed: SLP recommendations on wall          OT Problem List: Decreased strength;Decreased activity tolerance;Impaired balance (sitting and/or standing);Decreased knowledge of use of DME or AE;Decreased knowledge of precautions;Impaired sensation;Decreased cognition;Decreased safety awareness;Decreased coordination      OT Treatment/Interventions: Self-care/ADL training;Therapeutic exercise;Neuromuscular education;Energy conservation;DME and/or AE instruction;Manual therapy;Modalities;Therapeutic activities;Cognitive remediation/compensation;Visual/perceptual remediation/compensation;Patient/family education;Balance training    OT Goals(Current goals can be found in the care plan section) Acute Rehab OT Goals Patient Stated Goal: to be able to feel R side OT Goal Formulation: Patient unable to participate in goal setting Time For Goal Achievement: 07/08/21 Potential to Achieve Goals: Good  OT Frequency: Min 2X/week   Barriers to D/C:            Co-evaluation PT/OT/SLP Co-Evaluation/Treatment: Yes Reason for Co-Treatment: For patient/therapist safety;To address functional/ADL transfers;Necessary to address cognition/behavior during functional activity;Complexity of the patient's impairments (multi-system involvement)   OT goals addressed during session: ADL's and self-care;Proper use of Adaptive equipment and DME;Strengthening/ROM      AM-PAC OT "6 Clicks" Daily Activity     Outcome Measure Help from another person eating meals?: A Lot Help from another person taking care of personal grooming?: A Lot Help from another person toileting, which includes using toliet, bedpan, or urinal?: A Lot Help from another person bathing (including washing, rinsing, drying)?: A Lot Help from another person to put on and taking off regular upper body clothing?: A  Lot Help from another person to put on and taking off  regular lower body clothing?: Total 6 Click Score: 11   End of Session Equipment Utilized During Treatment: Gait belt;Oxygen Nurse Communication: Mobility status;Precautions  Activity Tolerance: Patient tolerated treatment well Patient left: in chair;with call bell/phone within reach;with chair alarm set;Other (comment)  OT Visit Diagnosis: Unsteadiness on feet (R26.81);Muscle weakness (generalized) (M62.81);History of falling (Z91.81)                Time: 7048-8891 OT Time Calculation (min): 38 min Charges:  OT General Charges $OT Visit: 1 Visit OT Evaluation $OT Eval Moderate Complexity: 1 Mod OT Treatments $Self Care/Home Management : 8-22 mins   Brynn, OTR/L  Acute Rehabilitation Services Pager: [phone] Office: [phone] .   Mateo Flow 06/24/2021, 10:42 AM

## 2021-06-24 NOTE — Progress Notes (Signed)
OT Treatment Note  Pt seen to assess with use of partial occlusion. Pt appears to be R eye dominant. Nasal portion of L eye occluded. Pt reports this helps his double vision. Will continue to follow acutely    06/24/21 1400  OT Visit Information  Last OT Received On 06/24/21  Assistance Needed +2  History of Present Illness 75 y.o. male admitted with ICH L thalamic R side weakness and numbness PMH HF aortic valve stenosis s/p valve replacement BPH afib cad s/p CABG HTn thrombocytopenia,HLD,  TIA mitral valvle regurgitation bil LE cellulitis,  Precautions  Precautions Fall  Precaution Comments R inattention, decr R sensation  Pain Assessment  Pain Assessment Faces  Faces Pain Scale 0  Cognition  Arousal/Alertness Awake/alert  Behavior During Therapy Flat affect;Impulsive  Vision- Assessment  Additional Comments Pt complaining of double vision; appears to be R eye doinanat; nasal portion of L non-dominant eye taped with transpore tape. Tape appears to help per pt report  OT - End of Session  Activity Tolerance Patient tolerated treatment well  Patient left in chair;with call bell/phone within reach;with chair alarm set  Nurse Communication Other (comment) (use of glasses)  OT Assessment/Plan  OT Plan Discharge plan remains appropriate  OT Visit Diagnosis Unsteadiness on feet (R26.81);Muscle weakness (generalized) (M62.81);History of falling (Z91.81)  OT Frequency (ACUTE ONLY) Min 2X/week  Recommendations for Other Services Rehab consult  Follow Up Recommendations CIR  OT Equipment Other (comment)  AM-PAC OT "6 Clicks" Daily Activity Outcome Measure (Version 2)  Help from another person eating meals? 2  Help from another person taking care of personal grooming? 2  Help from another person toileting, which includes using toliet, bedpan, or urinal? 2  Help from another person bathing (including washing, rinsing, drying)? 2  Help from another person to put on and taking off regular  upper body clothing? 2  Help from another person to put on and taking off regular lower body clothing? 1  6 Click Score 11  Progressive Mobility  What is the highest level of mobility based on the progressive mobility assessment? Level 3 (Stands with assist) - Balance while standing  and cannot march in place  OT Goal Progression  Progress towards OT goals Progressing toward goals  Acute Rehab OT Goals  Patient Stated Goal to be able to feel R side  OT Goal Formulation Patient unable to participate in goal setting  Time For Goal Achievement 07/08/21  Potential to Achieve Goals Good  ADL Goals  Pt Will Perform Grooming with min assist;sitting  Pt Will Transfer to Toilet with max assist;stand pivot transfer;bedside commode  Additional ADL Goal #1 pt will utilize patch or glasses prior to transfers mod I to help with monoccular vision for decrease fall  risk during transfers.  Additional ADL Goal #2 pt will complete bed mobility supervision as precursor to adls  OT Time Calculation  OT Start Time (ACUTE ONLY) 1257  OT Stop Time (ACUTE ONLY) 1310  OT Time Calculation (min) 13 min  OT General Charges  $OT Visit 1 Visit  OT Treatments  $Therapeutic Activity 8-22 mins  Luisa Dago, OT/L   Acute OT Clinical Specialist Acute Rehabilitation Services Pager [phone] Office [phone]

## 2021-06-24 NOTE — Progress Notes (Addendum)
STROKE TEAM PROGRESS NOTE   INTERVAL HISTORY 75 year old male presenting with right sided weakness and numbness. Hospitalization has been unremarkable. No family member present at time of this exam. He has no complaints. He has eye patch in place. Looks more bright today than previous evaluation.     Vitals:   06/24/21 1000 06/24/21 1001 06/24/21 1100 06/24/21 1200  BP: (!) 183/159 (!) 164/103 (!) 144/80 122/80  Pulse: 65  (!) 53 (!) 57  Resp: 13 15 11 11   Temp:    97.7 F (36.5 C)  TempSrc:    Oral  SpO2: (!) 84%  100% 100%  Weight:      Height:       CBC:  Recent Labs  Lab 06/23/21 0355 06/23/21 0401 06/24/21 0219  WBC 4.1  --  4.5  NEUTROABS 2.6  --   --   HGB 14.1 15.0 12.9*  HCT 45.3 44.0 41.0  MCV 89.7  --  88.0  PLT 124*  --  114*    Basic Metabolic Panel:  Recent Labs  Lab 06/23/21 0355 06/23/21 0401 06/24/21 0219  NA 137 139 138  K 3.8 3.9 3.9  CL 102 106 99  CO2 24  --  28  GLUCOSE 88 86 75  BUN 27* 30* 21  CREATININE 1.04 1.10 1.34*  CALCIUM 10.0  --  9.2     Lipid Panel:  Recent Labs  Lab 06/24/21 0219  CHOL 143  TRIG 72  HDL 37*  CHOLHDL 3.9  VLDL 14  LDLCALC 92    HgbA1c:  Recent Labs  Lab 06/23/21 0400  HGBA1C 5.2    Urine Drug Screen: No results for input(s): LABOPIA, COCAINSCRNUR, LABBENZ, AMPHETMU, THCU, LABBARB in the last 168 hours.  Alcohol Level  Recent Labs  Lab 06/23/21 0355  ETH <10     IMAGING past 24 hours MR BRAIN W WO CONTRAST  Result Date: 06/24/2021 CLINICAL DATA:  Cerebral hemorrhage suspected. EXAM: MRI HEAD WITHOUT AND WITH CONTRAST TECHNIQUE: Multiplanar, multiecho pulse sequences of the brain and surrounding structures were obtained without and with intravenous contrast. CONTRAST:  7.68mL GADAVIST GADOBUTROL 1 MMOL/ML IV SOLN COMPARISON:  Head CT June 23, 2021 FINDINGS: Brain: No significant interval change in size of the intraparenchymal hematoma in the left thalamus measuring approximately 3.2 x 2.2  x 2.0 cm with surrounding vasogenic edema with mass effect posterior limb of internal capsule and left cerebral peduncle. Multiple foci of susceptibility artifact are seen scattered throughout the supratentorial and infratentorial parenchyma, as well as in the right cortical sulci suggesting amyloid angiopathy. Remote lacunar infarct in the right postcentral gyrus. Scattered and confluent foci of T2 hyperintensity are seen within the white matter of the cerebral hemispheres and within the pons, nonspecific, most likely related to chronic small vessel ischemia. No focus of abnormal contrast enhancement identified. No evidence of ischemic infarct, hydrocephalus or extra-axial collection. Vascular: Normal flow voids. Skull and upper cervical spine: Normal marrow signal. Sinuses/Orbits: Negative. Other: None. IMPRESSION: 1. Stable size of intraparenchymal hematoma centered in the left thalamus with surrounding vasogenic edema and regional mass effect. 2. 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 3. No focus of abnormal contrast enhancement. Electronically Signed   By: June 25, 2021 M.D.   On: 06/24/2021 09:19   DG Swallowing Func-Speech Pathology  Result Date: 06/24/2021 Table formatting from the original result was not included. Objective Swallowing  Evaluation: Type of Study: MBS-Modified Barium Swallow Study  Patient Details Name: Carlos Welch MRN: [id number] Date of Birth: 06-02-1946 Today's Date: 06/24/2021 Time: SLP Start Time (ACUTE ONLY): 0820 -SLP Stop Time (ACUTE ONLY): 0835 SLP Time Calculation (min) (ACUTE ONLY): 15 min Past Medical History: Past Medical History: Diagnosis Date  A-fib (HCC)   Abscess of jaw 07/19/2020  Acute on chronic heart failure with preserved ejection fraction (HCC) 07/08/2020  Aortic stenosis   a. s/p pericardial AVR 2015.   Aortic valve stenosis 07/19/2020  Bacteremia 07/19/2020  Bacterial endocarditis   BPH (benign prostatic hyperplasia) 07/08/2020  CAD (coronary artery disease)   a. [facility] CABG[facility]  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/08/2020  Elevated troponin 07/08/2020  Essential hypertension 07/08/2020  Generalized abdominal pain   History of noncompliance with medical treatment   Hyperlipemia 07/08/2020  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  Malnutrition (HCC)   MRSA bacteremia   Nonrheumatic mitral valve regurgitation 07/19/2020  Normocytic anemia 07/08/2020  Pressure injury of skin 05/12/2020  S/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 2015  Severe malnutrition (HCC) 07/19/2020  Thrombocytopenia (HCC) 07/08/2020  TIA (transient ischemic attack)   Weight loss 07/19/2020 Past Surgical History: Past Surgical History: Procedure Laterality Date  AORTIC VALVE REPLACEMENT    CORONARY ARTERY BYPASS GRAFT  2015  HERNIA REPAIR    REMOVAL OF IMPLANT  03/14/2020  TEE WITHOUT CARDIOVERSION N/A 05/13/2020  Procedure: TRANSESOPHAGEAL ECHOCARDIOGRAM (TEE);  Surgeon: Lewayne Buntingrenshaw, Brian S, MD;  [facility];  Service: Cardiovascular;  Laterality: N/A; HPI: 75 y.o. male with PMH: aFib, aortic stenosis, CAD, bilateral lower extremity cellulitis, combined CHF, HTN, HLD, thrombocytopenia, presented toEd via EMS for evaluation of abrupt onset of right-sided weakness and numbness. LVO positive code stroke was activated. Noncontrasted head CT showed left thalamic bleed around 9 cc without IVH.  Subjective: Pt awake and alert but having  trouble keeping eyes open. Assessment / Plan / Recommendation CHL IP CLINICAL IMPRESSIONS 06/24/2021 Clinical Impression Mild-mod oropharyngeal demonstrated with silent aspiration. Mild difficulty maintainng bolus cohesion resulting in sublingual spillage with thin barium. He exhibited rapid and inefficient mastication of graham cracker pieces in puree. He achieves adequate laryngeal vestibular closure but mistimed leading to pyriform sinus filling and penetration (PAS 3, 4)  (throat clear) and aspiration (PAS 8 silent) ensues. Compensatory strategies not attempted at this time given cognitive-linguistic impairments and as he improves, will be able to evaluate. Vallecular residue was minimal and not problematic. Timing and safety with nectar thick was improved. Recommend continue nectar and Dys 2 texture, pills crushed, check for oral pocketing, straws allowed and assist when eating. ST to follow. SLP Visit Diagnosis Dysphagia, oropharyngeal phase (R13.12) Attention and concentration deficit following -- Frontal lobe and executive function deficit following -- Impact on safety and function Moderate aspiration risk   CHL IP TREATMENT RECOMMENDATION 06/24/2021 Treatment Recommendations Therapy as outlined in treatment plan below   Prognosis 06/24/2021 Prognosis for Safe Diet Advancement Good Barriers to Reach Goals Cognitive deficits;Language deficits Barriers/Prognosis Comment -- CHL IP DIET RECOMMENDATION 06/24/2021 SLP Diet Recommendations Dysphagia 2 (Fine chop) solids;Nectar thick liquid Liquid Administration via Cup;Straw Medication Administration Crushed with puree  Compensations Lingual sweep for clearance of pocketing;Small sips/bites;Slow rate;Clear throat intermittently Postural Changes Seated upright at 90 degrees   CHL IP OTHER RECOMMENDATIONS 06/24/2021 Recommended Consults -- Oral Care Recommendations Oral care BID Other Recommendations --   CHL IP FOLLOW UP RECOMMENDATIONS 06/24/2021 Follow up Recommendations  Inpatient Rehab   CHL IP FREQUENCY AND DURATION 06/24/2021 Speech Therapy Frequency (ACUTE ONLY) min 2x/week Treatment Duration 2 weeks      CHL IP ORAL PHASE 06/24/2021 Oral Phase Impaired Oral - Pudding Teaspoon -- Oral - Pudding Cup -- Oral - Honey Teaspoon -- Oral - Honey Cup -- Oral - Nectar Teaspoon -- Oral - Nectar Cup Decreased bolus cohesion Oral - Nectar Straw WFL Oral - Thin Teaspoon -- Oral - Thin Cup Right anterior bolus loss Oral - Thin Straw WFL Oral - Puree -- Oral - Mech Soft (No Data) Oral - Regular -- Oral - Multi-Consistency -- Oral - Pill -- Oral Phase - Comment --  CHL IP PHARYNGEAL PHASE 06/24/2021 Pharyngeal Phase Impaired Pharyngeal- Pudding Teaspoon -- Pharyngeal -- Pharyngeal- Pudding Cup -- Pharyngeal -- Pharyngeal- Honey Teaspoon -- Pharyngeal -- Pharyngeal- Honey Cup -- Pharyngeal -- Pharyngeal- Nectar Teaspoon -- Pharyngeal -- Pharyngeal- Nectar Cup WFL Pharyngeal -- Pharyngeal- Nectar Straw Delayed swallow initiation-pyriform sinuses Pharyngeal -- Pharyngeal- Thin Teaspoon -- Pharyngeal -- Pharyngeal- Thin Cup Penetration/Aspiration during swallow;Pharyngeal residue - valleculae;Delayed swallow initiation-pyriform sinuses Pharyngeal Material enters airway, CONTACTS cords and then ejected out;Material enters airway, remains ABOVE vocal cords and not ejected out Pharyngeal- Thin Straw Penetration/Aspiration during swallow;Delayed swallow initiation-pyriform sinuses Pharyngeal Material enters airway, passes BELOW cords without attempt by patient to eject out (silent aspiration) Pharyngeal- Puree -- Pharyngeal -- Pharyngeal- Mechanical Soft Pharyngeal residue - valleculae;Pharyngeal residue - pyriform Pharyngeal -- Pharyngeal- Regular -- Pharyngeal -- Pharyngeal- Multi-consistency -- Pharyngeal -- Pharyngeal- Pill -- Pharyngeal -- Pharyngeal Comment --  CHL IP CERVICAL ESOPHAGEAL PHASE 06/24/2021 Cervical Esophageal Phase WFL Pudding Teaspoon -- Pudding Cup -- Honey Teaspoon -- Honey Cup --  Nectar Teaspoon -- Nectar Cup -- Nectar Straw -- Thin Teaspoon -- Thin Cup -- Thin Straw -- Puree -- Mechanical Soft -- Regular -- Multi-consistency -- Pill -- Cervical Esophageal Comment -- Royce Macadamia 06/24/2021, 9:48 AM              ECHOCARDIOGRAM COMPLETE  Result Date: 06/23/2021    ECHOCARDIOGRAM REPORT   Patient Name:   Carlos Welch Date of Exam: 06/23/2021 Medical Rec #:  [id number]    Height:       74.0 in Accession #:    [id number]   Weight:       190.0 lb Date of Birth:  1945-11-14   BSA:          2.127 m Patient Age:    74 years     BP:           125/74 mmHg Patient Gender: M            HR:           66 bpm. Exam Location:  Inpatient Procedure: 2D Echo, 3D Echo, Cardiac Doppler and Color Doppler Indications:    Stroke  History:        Patient has prior history of Echocardiogram examinations, most                 recent 08/09/2020. CHF, Abnormal ECG and Prior CABG, TIA, Aortic                 Valve  Disease and Mitral Valve Disease, Arrythmias:Atrial                 Fibrillation, Signs/Symptoms:Bacteremia; Risk                 Factors:Hypertension. Aortic stenosis.  Sonographer:    Sheralyn Boatman RDCS Referring Phys: [id number] ASHISH ARORA  Sonographer Comments: Technically difficult study due to poor echo windows. Study interrupted. Patient supine with thin habitus. IMPRESSIONS  1. Left ventricular ejection fraction, by estimation, is 60 to 65%. The left ventricle has normal function. The left ventricle has no regional wall motion abnormalities. There is moderate left ventricular hypertrophy. Left ventricular diastolic parameters are indeterminate.  2. Right ventricular systolic function is moderately reduced. The right ventricular size is mildly enlarged. Tricuspid regurgitation signal is inadequate for assessing PA pressure.  3. Left atrial size was mildly dilated.  4. Right atrial size was moderately dilated.  5. The mitral valve is abnormal. Moderate mitral annular calcification. Eccentric mitral  regurgitation jet, poorly visualized on current study, however was moderate to severe on TEE 04/2020. There does appear to be pulmonic vein systolic reversal, which suggests severe MR. Consider TEE for further evaluation  6. There is a bioprosthetic valve present in the aortic position     Aortic valve regurgitation is not visualized. Echo findings are consistent with normal structure and function of the aortic valve prosthesis. Aortic valve mean gradient measures 11.0 mmHg, stable from prior echoes  7. Aortic dilatation noted. 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  8. The inferior vena cava is dilated in size with <50% respiratory variability, suggesting right atrial pressure of 15 mmHg. Conclusion(s)/Recommendation(s): No intracardiac source of embolism detected on this transthoracic study. A transesophageal echocardiogram is recommended to exclude cardiac source of embolism if clinically indicated. FINDINGS  Left Ventricle: Left ventricular ejection fraction, by estimation, is 60 to 65%. The left ventricle has normal function. The left ventricle has no regional wall motion abnormalities. The left ventricular internal cavity size was normal in size. There is  moderate left ventricular hypertrophy. Left ventricular diastolic parameters are indeterminate. Right Ventricle: The right ventricular size is mildly enlarged. Right vetricular wall thickness was not well visualized. Right ventricular systolic function is moderately reduced. Tricuspid regurgitation signal is inadequate for assessing PA pressure. Left Atrium: Left atrial size was mildly dilated. Right Atrium: Right atrial size was moderately dilated. Pericardium: There is no evidence of pericardial effusion. Mitral Valve: The mitral valve is abnormal. Moderate mitral annular calcification. Moderate to severe mitral valve regurgitation. MV peak gradient, 15.4 mmHg. The mean mitral valve gradient is 3.5 mmHg. Tricuspid Valve: The tricuspid  valve is normal in structure. Tricuspid valve regurgitation is not demonstrated. Aortic Valve: The aortic valve has been repaired/replaced. Aortic valve regurgitation is not visualized. Aortic valve mean gradient measures 11.0 mmHg. Aortic valve peak gradient measures 22.7 mmHg. Aortic valve area, by VTI measures 1.55 cm. There is a  bioprosthetic valve present in the aortic position. Echo findings are consistent with normal structure and function of the aortic valve prosthesis. Pulmonic Valve: The pulmonic valve was not well visualized. Pulmonic valve regurgitation is not visualized. Aorta: The aortic root is normal in size and structure and aortic dilatation noted. 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 Venous: The inferior vena cava is dilated in size with less than 50% respiratory variability, suggesting right atrial pressure of 15 mmHg. IAS/Shunts: The interatrial septum was not well visualized.  LEFT VENTRICLE PLAX  2D LVIDd:         5.50 cm LVIDs:         4.00 cm LV PW:         1.40 cm LV IVS:        1.40 cm LVOT diam:     2.00 cm     3D Volume EF: LV SV:         78          3D EF:        59 % LV SV Index:   37          LV EDV:       225 ml LVOT Area:     3.14 cm    LV ESV:       92 ml                            LV SV:        133 ml  LV Volumes (MOD) LV vol d, MOD A2C: 87.4 ml LV vol d, MOD A4C: 81.4 ml LV vol s, MOD A2C: 38.7 ml LV vol s, MOD A4C: 25.1 ml LV SV MOD A2C:     48.7 ml LV SV MOD A4C:     81.4 ml LV SV MOD BP:      54.4 ml RIGHT VENTRICLE            IVC RV S prime:     4.56 cm/s  IVC diam: 3.10 cm TAPSE (M-mode): 0.7 cm LEFT ATRIUM             Index       RIGHT ATRIUM           Index LA diam:        5.20 cm 2.45 cm/m  RA Area:     28.20 cm LA Vol (A2C):   65.3 ml 30.71 ml/m RA Volume:   92.30 ml  43.40 ml/m LA Vol (A4C):   93.4 ml 43.92 ml/m LA Biplane Vol: 82.6 ml 38.84 ml/m  AORTIC VALVE AV Area (Vmax):    1.70 cm AV Area (Vmean):   1.64 cm AV Area (VTI):     1.55  cm AV Vmax:           238.00 cm/s AV Vmean:          154.000 cm/s AV VTI:            0.502 m AV Peak Grad:      22.7 mmHg AV Mean Grad:      11.0 mmHg LVOT Vmax:         129.00 cm/s LVOT Vmean:        80.500 cm/s LVOT VTI:          0.248 m LVOT/AV VTI ratio: 0.49  AORTA Ao Root diam: 3.70 cm Ao Asc diam:  4.30 cm MITRAL VALVE MV Area (PHT): 2.60 cm     SHUNTS MV Area VTI:   1.46 cm     Systemic VTI:  0.25 m MV Peak grad:  15.4 mmHg    Systemic Diam: 2.00 cm MV Mean grad:  3.5 mmHg MV Vmax:       1.96 m/s MV Vmean:      78.8 cm/s MV Decel Time: 292 msec MV E velocity: 177.00 cm/s Epifanio Lesches MD Electronically signed by Epifanio Lesches MD Signature Date/Time: 06/23/2021/4:07:23 PM    Final  PHYSICAL EXAM  General: Awake alert in no distress HEENT: Normocephalic/atraumatic Lungs: Clear Cardiovascular: Irregularly irregular Abdomen nondistended nontender Extremities: Extensive cellulitis  Neurological exam Awake alert oriented to self, knows he is in hospital  Unable to tell me the date or time Speech is mildly dysarthric Poor attention concentration   Cranial nerves: Pupils equal round react light, extraocular movements appear unhindered.  blink to threat inconsistent, right lower facial weakness observed, tongue and palate midline. Motor examination with flaccid right upper extremity and  2/5 movement in the right lower extremity.  Left upper and lower extremity are normal Sensation: moderate sensory loss on the right hemibody. Coordination: No gross dysmetria on the left, unable to follow on the right.  ASSESSMENT/PLAN Carlos Welch is a 75 y.o. male with history of atrial fibrillation, on Eliquis, aortic stenosis, coronary artery disease, bilateral lower extremity cellulitis, combined CHF, essential hypertension, hyperlipidemia, thrombocytopenia, presented to the emergency room via EMS when the family called EMS for evaluation of abrupt onset of right-sided weakness and  numbness.  The patient was last seen well at 8 PM on 06/22/2021 when he went to bed.  He was noted sometime in the middle of the night to be weak on the right side and have slurred speech.  EMTs also felt that he was a little confused and unable to answer all questions appropriately.  LVO positive code stroke was activated. He was seen and evaluated emergently on the ER bridge-symptoms are consistent with right hemiparesis and right-sided sensory loss.  Noncontrasted head CT showed left thalamic bleed around 9 cc without IVH. CT angiography with no vascular malformation and delayed images negative for spot sign Likely hypertensive bleed due to coagulopathy from Eliquis and hypertension   On initial exam our team spoke with patient's daughter Blanchie Serve over the phone.  She reports that he is very noncompliant with his medications.  He takes what ever he likes.  She was concerned that he was taking excessive gingerroot which she knows is a potent blood thinner and might have contributed to this.  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  Atrophy.   CTA head & neck: negative for LVO, CTA spot sign, or intracranial aneursym. Left PCA P3 segment irregularity and stenosis. Extensive calcified atherosclerosis. Severe bilateval V4 vetrtebral artery stenosis. 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 Stable size of intraparenchymal hematoma centered in the left thalamus with surrounding vasogenic edema and regional mass effect. 2. 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 3. No focus of abnormal contrast enhancement.  2D Echo: ef 60-65%, la mildly dilated,  dilated ascending aorta LDL 92 HgbA1c 5.2 VTE prophylaxis - scd  Eliquis (apixaban) daily prior to admission, now on No antithrombotic due to cerebral hemorrhage  Therapy recommendations: CIR Disposition:   Pending  Chronic Afib Mild bradycardia On Eliquis PTA Currently Eliquis on hold due to ICH  Hypertension Home meds:  hydralazine  every 8 hours, Coreg 6.25, Lasix and spironolactone Off Cleviprex now Resume hydralazine, Lasix and spironolactone.  Hold off Coreg due to bradycardia BP goal less than 160 given stable hematoma on repeat CT Long-term BP goal normotensive  Hyperlipidemia Home meds:  none LDL 92, goal less than 70 Hold off  statin now for acute ICH Consider statin at discharge  Tobacco abuse Current smoker Smoking cessation counseling provided Pt is willing to quit  Other Stroke Risk Factors  Advanced Age >/= 55   Substance abuse - UDS:  THC POSITIVE Patient advised to stop using due to stroke risk. Congestive heart failure CAD  Other Active Problems Chronic thrombocytopenia Recent bilateral cellulitis on doxycycline    Hospital day # 1  ATTENDING NOTE: I reviewed above note and agree with the assessment and plan. Pt was seen and examined.   No family at bedside.  Patient sitting in chair, more awake alert and interactive than yesterday.  MRI done this morning showed stable hematoma, no underlying mass lesion.  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  Still has mild right facial droop and right hemiparesis but also improved from yesterday.  BP stable, off Cleviprex.  On p.o. BP meds.  Given bradycardia, DC Coreg, continue home Lasix, spironolactone and hydralazine.  LDL 92, consider start statin on discharge. PT/OT recommend CIR.  For detailed assessment and plan, please refer to above as I have made changes wherever appropriate.   This patient is critically ill due to ICH, A. fib on  Eliquis status post Andexxa, hypertensive emergency and at significant risk of neurological worsening, death form hematoma expansion, cerebral edema, brain herniation, heart failure, seizure. This patient's care requires constant monitoring of vital signs, hemodynamics, respiratory and cardiac monitoring, review of multiple databases, neurological assessment, discussion with family, other specialists and medical decision making of high complexity. I spent 35 minutes of neurocritical care time in the care of this patient.   Marvel Plan, MD PhD Stroke Neurology 06/24/2021 6:31 PM     To contact Stroke Continuity provider, please refer to [email or web address]. After hours, contact General Neurology

## 2021-06-24 NOTE — Progress Notes (Signed)
Pt admitted to 3W AxOx4, VS within Pt norm and as per flow. Pt oriented to 3W processes. Pt familiar with the [facility] system. All questions and concerns addressed. Call bell placed within reach, will continue to monitor and maintain safety.

## 2021-06-24 NOTE — Progress Notes (Signed)
Modified Barium Swallow Progress Note  Patient Details  Name: Carlos Welch MRN: [id number] Date of Birth: 06/04/1946  Today's Date: 06/24/2021  Modified Barium Swallow completed.  Full report located under Chart Review in the Imaging Section.  Brief recommendations include the following:  Clinical Impression  Mild-mod oropharyngeal demonstrated with silent aspiration. Mild difficulty maintainng bolus cohesion resulting in sublingual spillage with thin barium. He exhibited rapid and inefficient mastication of graham cracker pieces in puree. He achieves adequate laryngeal vestibular closure but mistimed leading to pyriform sinus filling and penetration (PAS 3, 4)  (throat clear) and aspiration (PAS 8 silent) ensues. Compensatory strategies not attempted at this time given cognitive-linguistic impairments and as he improves, will be able to evaluate. Vallecular residue was minimal and not problematic. Timing and safety with nectar thick was improved. Recommend continue nectar and Dys 2 texture, pills crushed, check for oral pocketing, straws allowed and assist when eating. ST to follow.   Swallow Evaluation Recommendations       SLP Diet Recommendations: Dysphagia 2 (Fine chop) solids;Nectar thick liquid   Liquid Administration via: Cup;Straw   Medication Administration: Crushed with puree       Compensations: Lingual sweep for clearance of pocketing;Small sips/bites;Slow rate;Clear throat intermittently   Postural Changes: Seated upright at 90 degrees   Oral Care Recommendations: Oral care BID        Royce Macadamia 06/24/2021,9:48 AM

## 2021-06-24 NOTE — Progress Notes (Signed)
Inpatient Rehab Admissions Coordinator Note:   Per therapy recommendations patient was screened for CIR candidacy by Stephania Fragmin, PT. At this time, pt appears to be a potential candidate for CIR. I will place an order for rehab consult for full assessment, per our protocol.  Please contact me any with questions.Estill Dooms, PT, DPT [phone] 06/24/21 2:01 PM

## 2021-06-25 ENCOUNTER — Encounter (HOSPITAL_COMMUNITY): Payer: Self-pay | Admitting: Student in an Organized Health Care Education/Training Program

## 2021-06-25 LAB — BASIC METABOLIC PANEL
Anion gap: 10 (ref 5–15)
BUN: 22 mg/dL (ref 8–23)
CO2: 26 mmol/L (ref 22–32)
Calcium: 9.4 mg/dL (ref 8.9–10.3)
Chloride: 102 mmol/L (ref 98–111)
Creatinine, Ser: 1.15 mg/dL (ref 0.61–1.24)
GFR, Estimated: >60 mL/min (ref >60)
Glucose, Bld: 73 mg/dL (ref 70–99)
Potassium: 3.6 mmol/L (ref 3.5–5.1)
Sodium: 138 mmol/L (ref 135–145)

## 2021-06-25 LAB — CBC
HCT: 42.6 % (ref 39.0–52.0)
Hemoglobin: 14 g/dL (ref 13.0–17.0)
MCH: 28.5 pg (ref 26.0–34.0)
MCHC: 32.9 g/dL (ref 30.0–36.0)
MCV: 86.6 fL (ref 80.0–100.0)
Platelets: 126 10*3/uL — ABNORMAL LOW (ref 150–400)
RBC: 4.92 MIL/uL (ref 4.22–5.81)
RDW: 18.5 % — ABNORMAL HIGH (ref 11.5–15.5)
WBC: 4.1 10*3/uL (ref 4.0–10.5)
nRBC: 0 % (ref 0.0–0.2)

## 2021-06-25 NOTE — Progress Notes (Signed)
Progress Note    Carlos Welch  ZOX:[id number] DOB: Aug 22, 1946  DOA: 06/23/2021 PCP: [name], PA-C      Brief Narrative:    Medical records reviewed and are as summarized below:  Carlos Welch is a 75 y.o. male with past medical history of atrial fibrillation, on Eliquis, aortic stenosis, coronary artery disease, bilateral lower extremity cellulitis, combined CHF, essential hypertension, hyperlipidemia, thrombocytopenia, who presented to the hospital because of confusion, right-sided weakness and numbness.      Assessment/Plan:   Active Problems:   Intracerebral hemorrhage (HCC)    Body mass index is 24.39 kg/m.   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  Continue PT and OT.-The rehabilitation of the inpatient rehab center was recommended.  Follow-up with neurologist.  2D echo showed EF estimated at 60 to 65%, LDL 92, hemoglobin A1c 5.2.  Permanent atrial fibrillation: Eliquis has been held.  Hypertension: Continue antihypertensives  Hyperlipidemia: No statin for now because of intracranial hemorrhage.  Tobacco and marijuana use disorder: Counseled to quit using cigarettes and marijuana.  Recent bilateral leg cellulitis: Doxycycline  Chronic thrombocytopenia: Platelet count is stable.   Diet Order             DIET DYS 2 Room service appropriate? No; Fluid consistency: Nectar Thick  Diet effective now                      Consultants: Neurologist  Procedures: None    Medications:    Chlorhexidine Gluconate Cloth  6 each Topical Daily   doxycycline  100 mg Oral BID   furosemide  80 mg Oral Daily   gabapentin  100 mg Oral QHS   hydrALAZINE  50 mg Oral Q8H   losartan  100 mg Oral Daily   mouth rinse  15 mL Mouth Rinse BID   pantoprazole  40 mg Oral Daily   senna-docusate  1 tablet Oral BID   Continuous  Infusions:   Anti-infectives (From admission, onward)    Start     Dose/Rate Route Frequency Ordered Stop   06/23/21 1100  doxycycline (VIBRA-TABS) tablet 100 mg        100 mg Oral 2 times daily 06/23/21 1009 06/28/21 0959              Family Communication/Anticipated D/C date and plan/Code Status   DVT prophylaxis: SCD's Start: 06/23/21 0417     Code Status: Full Code  Family Communication: Plan discussed with Herbert Seta, daughter, at the bedside Disposition Plan:    Status is: Inpatient  Remains inpatient appropriate because:Unsafe d/c plan and Inpatient level of care appropriate due to severity of illness  Dispo: The patient is from: Home              Anticipated d/c is to: [facility]              Patient currently is not medically stable to d/c.   Difficult to place patient No           Subjective:   C/o pain in the right and left lower extremities.  He is confused and unable to provide an adequate history.  It is unclear whether he has pain in the right or left extremities.  Objective:    Vitals:   06/24/21 2037 06/24/21 2324 06/25/21 0334 06/25/21 1029  BP: (!) 164/84 (!) 148/91 (!) 138/94 (!) 131/95  Pulse: (!) 54 (!) 56 (!) 52 65  Resp: Temp: 98.3 F (36.8 C) 98.6 F (37 C) 98.3 F (36.8 C) 98.9 F (37.2 C)  TempSrc: Oral Oral Oral Oral  SpO2: 94% 100% 98% 96%  Weight:      Height:       No data found.   Intake/Output Summary (Last 24 hours) at 06/25/2021 1338 Last data filed at 06/25/2021 1237 Gross per 24 hour  Intake 279.93 ml  Output 925 ml  Net -645.07 ml   Filed Weights   06/23/21 0415  Weight: 86.2 kg    Exam:  GEN: NAD SKIN: Warm and dry EYES: EOMI, no pallor or icterus ENT: MMM CV: RRR PULM: CTA B ABD: soft, ND, NT, +BS CNS: AAO x 3, slurred speech, right facial droop, right hemiparesis (power RUE-2/5, RLE-2/5) EXT: No edema or tenderness         Data Reviewed:   I have personally reviewed  following labs and imaging studies:  Labs: Labs show the following:   Basic Metabolic Panel: Recent Labs  Lab 06/22/21 1032 06/23/21 0355 06/23/21 0401 06/24/21 0219 06/25/21 0325  NA 138 137 139 138 138  K 4.5 3.8 3.9 3.9 3.6  CL 99 102 106 99 102  CO2 31 24  --  28 26  GLUCOSE 69* 88 86 75 73  BUN 18 27* 30* 21 22  CREATININE 1.06 1.04 1.10 1.34* 1.15  CALCIUM 10.0 10.0  --  9.2 9.4   GFR Estimated Creatinine Clearance: 66.5 mL/min (by C-G formula based on SCr of 1.15 mg/dL). Liver Function Tests: Recent Labs  Lab 06/22/21 1032 06/23/21 0355  AST 22 25  ALT 12 13  ALKPHOS 125* 117  BILITOT 1.4* 1.4*  PROT 7.5 7.1  ALBUMIN 4.1 3.6   No results for input(s): LIPASE, AMYLASE in the last 168 hours. No results for input(s): AMMONIA in the last 168 hours. Coagulation profile Recent Labs  Lab 06/23/21 0355  INR 1.5*    CBC: Recent Labs  Lab 06/23/21 0355 06/23/21 0401 06/24/21 0219 06/25/21 0325  WBC 4.1  --  4.5 4.1  NEUTROABS 2.6  --   --   --   HGB 14.1 15.0 12.9* 14.0  HCT 45.3 44.0 41.0 42.6  MCV 89.7  --  88.0 86.6  PLT 124*  --  114* 126*   Cardiac Enzymes: No results for input(s): CKTOTAL, CKMB, CKMBINDEX, TROPONINI in the last 168 hours. BNP (last 3 results) Recent Labs    08/24/20 1025 11/24/20 0942 06/22/21 1032  PROBNP 370.0* 572.0* 324.0*   CBG: Recent Labs  Lab 06/23/21 0353  GLUCAP 73   D-Dimer: No results for input(s): DDIMER in the last 72 hours. Hgb A1c: Recent Labs    06/23/21 0400  HGBA1C 5.2   Lipid Profile: Recent Labs    06/24/21 0219  CHOL 143  HDL 37*  LDLCALC 92  TRIG 72  CHOLHDL 3.9   Thyroid function studies: No results for input(s): TSH, T4TOTAL, T3FREE, THYROIDAB in the last 72 hours.  Invalid input(s): FREET3 Anemia work up: No results for input(s): VITAMINB12, FOLATE, FERRITIN, TIBC, IRON, RETICCTPCT in the last 72 hours. Sepsis Labs: Recent Labs  Lab 06/23/21 0355 06/24/21 0219  06/25/21 0325  WBC 4.1 4.5 4.1    Microbiology Recent Results (from the past 240 hour(s))  Resp Panel by RT-PCR (Flu A&B, Covid) Nasopharyngeal Swab     Status: None   Collection Time: 06/23/21  4:42 AM   Specimen: Nasopharyngeal Swab; Nasopharyngeal(NP) swabs in vial transport medium  Result Value Ref Range Status   SARS Coronavirus 2 by RT PCR NEGATIVE NEGATIVE Final    Comment: (NOTE) SARS-CoV-2 target nucleic acids are NOT DETECTED.  The SARS-CoV-2 RNA is generally detectable in upper respiratory specimens during the acute phase of infection. The lowest concentration of SARS-CoV-2 viral copies this assay can detect is 138 copies/mL. A negative result does not preclude SARS-Cov-2 infection and should not be used as the sole basis for treatment or other patient management decisions. A negative result may occur with  improper specimen collection/handling, submission of specimen other than nasopharyngeal swab, presence of viral mutation(s) within the areas targeted by this assay, and inadequate number of viral copies(<138 copies/mL). A negative result must be combined with clinical observations, patient history, and epidemiological information. The expected result is Negative.  Fact Sheet for Patients:  BloggerCourse.com  Fact Sheet for Healthcare Providers:  SeriousBroker.it  This test is no t yet approved or cleared by the [place] FDA and  has been authorized for detection and/or diagnosis of SARS-CoV-2 by FDA under an Emergency Use Authorization (EUA). This EUA will remain  in effect (meaning this test can be used) for the duration of the COVID-19 declaration under Section 564(b)(1) of the Act, 21 U.S.C.section 360bbb-3(b)(1), unless the authorization is terminated  or revoked sooner.       Influenza A by PCR NEGATIVE NEGATIVE Final   Influenza B by PCR NEGATIVE NEGATIVE Final    Comment: (NOTE) The Xpert Xpress  SARS-CoV-2/FLU/RSV plus assay is intended as an aid in the diagnosis of influenza from Nasopharyngeal swab specimens and should not be used as a sole basis for treatment. Nasal washings and aspirates are unacceptable for Xpert Xpress SARS-CoV-2/FLU/RSV testing.  Fact Sheet for Patients: BloggerCourse.com  Fact Sheet for Healthcare Providers: SeriousBroker.it  This test is not yet approved or cleared by the [place] FDA and has been authorized for detection and/or diagnosis of SARS-CoV-2 by FDA under an Emergency Use Authorization (EUA). This EUA will remain in effect (meaning this test can be used) for the duration of the COVID-19 declaration under Section 564(b)(1) of the Act, 21 U.S.C. section 360bbb-3(b)(1), unless the authorization is terminated or revoked.  Performed at [facility], [address] [place], [place] [address]   MRSA Next Gen by PCR, Nasal     Status: None   Collection Time: 06/23/21  5:33 AM   Specimen: Nasal Mucosa; Nasal Swab  Result Value Ref Range Status   MRSA by PCR Next Gen NOT DETECTED NOT DETECTED Final    Comment: (NOTE) The GeneXpert MRSA Assay (FDA approved for NASAL specimens only), is one component of a comprehensive MRSA colonization surveillance program. It is not intended to diagnose MRSA infection nor to guide or monitor treatment for MRSA infections. Test performance is not FDA approved in patients less than 4 years old. Performed at [facility], [address] [place], [place] [address]     Procedures and diagnostic studies:  MR BRAIN W WO CONTRAST  Result Date: 06/24/2021 CLINICAL DATA:  Cerebral hemorrhage suspected. EXAM: MRI HEAD WITHOUT AND WITH CONTRAST TECHNIQUE: Multiplanar, multiecho pulse sequences of the brain and surrounding structures were obtained without and with intravenous contrast. CONTRAST:  7.68mL GADAVIST GADOBUTROL 1 MMOL/ML IV SOLN COMPARISON:   Head CT June 23, 2021 FINDINGS: Brain: No significant interval change in size of the intraparenchymal hematoma in the left thalamus  measuring approximately 3.2 x 2.2 x 2.0 cm with surrounding vasogenic edema with mass effect posterior limb of internal capsule and left cerebral peduncle. Multiple foci of susceptibility artifact are seen scattered throughout the supratentorial and infratentorial parenchyma, as well as in the right cortical sulci suggesting amyloid angiopathy. Remote lacunar infarct in the right postcentral gyrus. Scattered and confluent foci of T2 hyperintensity are seen within the white matter of the cerebral hemispheres and within the pons, nonspecific, most likely related to chronic small vessel ischemia. No focus of abnormal contrast enhancement identified. No evidence of ischemic infarct, hydrocephalus or extra-axial collection. Vascular: Normal flow voids. Skull and upper cervical spine: Normal marrow signal. Sinuses/Orbits: Negative. Other: None. IMPRESSION: 1. Stable size of intraparenchymal hematoma centered in the left thalamus with surrounding vasogenic edema and regional mass effect. 2. 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 3. No focus of abnormal contrast enhancement. Electronically Signed   By: Baldemar LenisKatyucia  de [place] Rodrigues M.D.   On: 06/24/2021 09:19   DG Swallowing Func-Speech Pathology  Result Date: 06/24/2021 Table formatting from the original result was not included. Objective Swallowing Evaluation: Type of Study: MBS-Modified Barium Swallow Study  Patient Details Name: Lenise ArenaDanny L Henderson MRN: [id number] Date of Birth: 06-06-1946 Today's Date: 06/24/2021 Time: SLP Start Time (ACUTE ONLY): 0820 -SLP Stop Time (ACUTE ONLY): 0835 SLP Time Calculation (min) (ACUTE ONLY): 15 min Past Medical History: Past Medical History: Diagnosis Date   A-fib (HCC)   Abscess of jaw 07/19/2020  Acute on chronic heart failure with preserved ejection fraction (HCC) 07/08/2020  Aortic stenosis   a. s/p pericardial AVR 2015.  Aortic valve stenosis 07/19/2020  Bacteremia 07/19/2020  Bacterial endocarditis   BPH (benign prostatic hyperplasia) 07/08/2020  CAD (coronary artery disease)   [facility]. [facility] CABG[facility]  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/08/2020  Elevated troponin 07/08/2020  Essential hypertension 07/08/2020  Generalized abdominal pain   History of noncompliance with medical treatment   Hyperlipemia 07/08/2020  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  Malnutrition (HCC)   MRSA bacteremia   Nonrheumatic mitral valve regurgitation 07/19/2020  Normocytic anemia 07/08/2020  Pressure injury of skin 05/12/2020  S/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 2015  Severe malnutrition (HCC) 07/19/2020  Thrombocytopenia (HCC) 07/08/2020  TIA (transient ischemic attack)   Weight loss 07/19/2020 Past Surgical History: Past Surgical History: Procedure Laterality Date  AORTIC VALVE REPLACEMENT    CORONARY ARTERY BYPASS GRAFT  2015  HERNIA REPAIR    REMOVAL OF IMPLANT  03/14/2020  TEE WITHOUT CARDIOVERSION N/A 05/13/2020  Procedure: TRANSESOPHAGEAL ECHOCARDIOGRAM (TEE);  Surgeon: Lewayne Buntingrenshaw, Brian S, MD;  [facility];  Service: Cardiovascular;  Laterality: N/A; HPI: 75 y.o. male with PMH: aFib, aortic stenosis, CAD, bilateral lower extremity cellulitis, combined CHF, HTN, HLD, thrombocytopenia, presented toEd via EMS for evaluation of abrupt onset of right-sided weakness and  numbness. LVO positive code stroke was activated. Noncontrasted head CT showed left thalamic bleed around 9 cc without IVH.  Subjective: Pt awake and alert but having trouble keeping eyes open. Assessment / Plan / Recommendation CHL IP CLINICAL IMPRESSIONS 06/24/2021  Clinical Impression Mild-mod oropharyngeal demonstrated with silent aspiration. Mild difficulty maintainng bolus cohesion resulting in sublingual spillage with thin barium. He exhibited rapid and inefficient mastication of graham cracker pieces in puree. He achieves adequate laryngeal vestibular closure but mistimed leading to pyriform sinus filling and penetration (PAS 3, 4)  (throat clear) and aspiration (PAS 8 silent) ensues. Compensatory strategies not attempted at this time given cognitive-linguistic impairments and as he improves, will be able to evaluate. Vallecular residue was minimal and not problematic. Timing and safety with nectar thick was improved. Recommend continue nectar and Dys 2 texture, pills crushed, check for oral pocketing, straws allowed and assist when eating. ST to follow. SLP Visit Diagnosis Dysphagia, oropharyngeal phase (R13.12) Attention and concentration deficit following -- Frontal lobe and executive function deficit following -- Impact on safety and function Moderate aspiration risk   CHL IP TREATMENT RECOMMENDATION 06/24/2021 Treatment Recommendations Therapy as outlined in treatment plan below   Prognosis 06/24/2021 Prognosis for Safe Diet Advancement Good Barriers to Reach Goals Cognitive deficits;Language deficits Barriers/Prognosis Comment -- CHL IP DIET RECOMMENDATION 06/24/2021 SLP Diet Recommendations Dysphagia 2 (Fine chop) solids;Nectar thick liquid Liquid Administration via Cup;Straw Medication Administration Crushed with puree Compensations Lingual sweep for clearance of pocketing;Small sips/bites;Slow rate;Clear throat intermittently Postural Changes Seated upright at 90 degrees   CHL IP OTHER RECOMMENDATIONS  06/24/2021 Recommended Consults -- Oral Care Recommendations Oral care BID Other Recommendations --   CHL IP FOLLOW UP RECOMMENDATIONS 06/24/2021 Follow up Recommendations Inpatient Rehab   CHL IP FREQUENCY AND DURATION 06/24/2021 Speech Therapy Frequency (ACUTE ONLY) min 2x/week Treatment Duration 2 weeks      CHL IP ORAL PHASE 06/24/2021 Oral Phase Impaired Oral - Pudding Teaspoon -- Oral - Pudding Cup -- Oral - Honey Teaspoon -- Oral - Honey Cup -- Oral - Nectar Teaspoon -- Oral - Nectar Cup Decreased bolus cohesion Oral - Nectar Straw WFL Oral - Thin Teaspoon -- Oral - Thin Cup Right anterior bolus loss Oral - Thin Straw WFL Oral - Puree -- Oral - Mech Soft (No Data) Oral - Regular -- Oral - Multi-Consistency -- Oral - Pill -- Oral Phase - Comment --  CHL IP PHARYNGEAL PHASE 06/24/2021 Pharyngeal Phase Impaired Pharyngeal- Pudding Teaspoon -- Pharyngeal -- Pharyngeal- Pudding Cup -- Pharyngeal -- Pharyngeal- Honey Teaspoon -- Pharyngeal -- Pharyngeal- Honey Cup -- Pharyngeal -- Pharyngeal- Nectar Teaspoon -- Pharyngeal -- Pharyngeal- Nectar Cup WFL Pharyngeal -- Pharyngeal- Nectar Straw Delayed swallow initiation-pyriform sinuses Pharyngeal -- Pharyngeal- Thin Teaspoon -- Pharyngeal -- Pharyngeal- Thin Cup Penetration/Aspiration during swallow;Pharyngeal residue - valleculae;Delayed swallow initiation-pyriform sinuses Pharyngeal Material enters airway, CONTACTS cords and then ejected out;Material enters airway, remains ABOVE vocal cords and not ejected out Pharyngeal- Thin Straw Penetration/Aspiration during swallow;Delayed swallow initiation-pyriform sinuses Pharyngeal Material enters airway, passes BELOW cords without attempt by patient to eject out (silent aspiration) Pharyngeal- Puree -- Pharyngeal -- Pharyngeal- Mechanical Soft Pharyngeal residue - valleculae;Pharyngeal residue - pyriform Pharyngeal -- Pharyngeal- Regular -- Pharyngeal -- Pharyngeal- Multi-consistency -- Pharyngeal -- Pharyngeal- Pill -- Pharyngeal  -- Pharyngeal Comment --  CHL IP CERVICAL ESOPHAGEAL PHASE 06/24/2021 Cervical Esophageal Phase WFL Pudding Teaspoon -- Pudding Cup -- Honey Teaspoon -- Honey Cup -- Nectar Teaspoon -- Nectar Cup -- Nectar Straw -- Thin Teaspoon -- Thin Cup -- Thin Straw -- Puree -- Mechanical Soft -- Regular -- Multi-consistency -- Pill -- Cervical Esophageal Comment -- Royce Macadamia 06/24/2021, 9:48 AM              ECHOCARDIOGRAM COMPLETE  Result Date: 06/23/2021    ECHOCARDIOGRAM REPORT   Patient Name:   XAIVIER MALAY Tackitt Date of Exam: 06/23/2021 Medical Rec #:  [id number]    Height:       74.0 in Accession #:    [id number]   Weight:       190.0 lb Date of Birth:  1946/04/27   BSA:          2.127 m Patient Age:    74 years     BP:           125/74 mmHg Patient Gender: M            HR:           66 bpm. Exam Location:  Inpatient Procedure: 2D Echo, 3D Echo, Cardiac Doppler and Color Doppler Indications:    Stroke  History:        Patient has prior history of Echocardiogram examinations, most                 recent 08/09/2020. CHF, Abnormal ECG and Prior CABG, TIA, Aortic                 Valve Disease and Mitral Valve Disease, Arrythmias:Atrial                 Fibrillation, Signs/Symptoms:Bacteremia; Risk                 Factors:Hypertension. Aortic stenosis.  Sonographer:    Sheralyn Boatman RDCS Referring Phys: [id number] ASHISH ARORA  Sonographer Comments: Technically difficult study due to poor echo windows. Study interrupted. Patient supine with thin habitus. IMPRESSIONS  1. Left ventricular ejection fraction, by estimation, is 60 to 65%. The left ventricle has normal function. The left ventricle has no regional wall motion abnormalities. There is moderate left ventricular hypertrophy. Left ventricular diastolic parameters are indeterminate.  2. Right ventricular systolic function is moderately reduced. The right ventricular size is mildly enlarged. Tricuspid regurgitation signal is inadequate for assessing PA pressure.  3. Left atrial  size was mildly dilated.  4. Right atrial size was moderately dilated.  5. The mitral valve is abnormal. Moderate mitral annular calcification. Eccentric mitral regurgitation jet, poorly visualized on current study, however was moderate to severe on TEE 04/2020. There does appear to be pulmonic vein systolic reversal, which suggests severe MR. Consider TEE for further evaluation  6. There is a bioprosthetic valve present in the aortic position     Aortic valve regurgitation is not visualized. Echo findings are consistent with normal structure and function of the aortic valve prosthesis. Aortic valve mean gradient measures 11.0 mmHg, stable from prior echoes  7. Aortic dilatation noted. 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  8. The inferior vena cava is dilated in size with <50% respiratory variability, suggesting right atrial pressure of 15 mmHg. Conclusion(s)/Recommendation(s): No intracardiac source of embolism detected on this transthoracic study. A transesophageal echocardiogram is recommended to exclude cardiac source of embolism if clinically indicated. FINDINGS  Left Ventricle: Left ventricular ejection fraction, by estimation, is 60 to 65%. The left ventricle has normal function. The left ventricle has no regional wall motion abnormalities. The left ventricular internal cavity size was normal in size. There is  moderate left ventricular hypertrophy. Left ventricular diastolic parameters are indeterminate. Right Ventricle: The right ventricular size is mildly enlarged. Right vetricular wall thickness was not well visualized. Right ventricular systolic function is moderately reduced. Tricuspid regurgitation signal is inadequate for assessing PA pressure.  Left Atrium: Left atrial size was mildly dilated. Right Atrium: Right atrial size was moderately dilated. Pericardium: There is no evidence of pericardial effusion. Mitral Valve: The mitral valve is abnormal. Moderate mitral annular  calcification. Moderate to severe mitral valve regurgitation. MV peak gradient, 15.4 mmHg. The mean mitral valve gradient is 3.5 mmHg. Tricuspid Valve: The tricuspid valve is normal in structure. Tricuspid valve regurgitation is not demonstrated. Aortic Valve: The aortic valve has been repaired/replaced. Aortic valve regurgitation is not visualized. Aortic valve mean gradient measures 11.0 mmHg. Aortic valve peak gradient measures 22.7 mmHg. Aortic valve area, by VTI measures 1.55 cm. There is a  bioprosthetic valve present in the aortic position. Echo findings are consistent with normal structure and function of the aortic valve prosthesis. Pulmonic Valve: The pulmonic valve was not well visualized. Pulmonic valve regurgitation is not visualized. Aorta: The aortic root is normal in size and structure and aortic dilatation noted. 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 Venous: The inferior vena cava is dilated in size with less than 50% respiratory variability, suggesting right atrial pressure of 15 mmHg. IAS/Shunts: The interatrial septum was not well visualized.  LEFT VENTRICLE PLAX 2D LVIDd:         5.50 cm LVIDs:         4.00 cm LV PW:         1.40 cm LV IVS:        1.40 cm LVOT diam:     2.00 cm     3D Volume EF: LV SV:         78          3D EF:        59 % LV SV Index:   37          LV EDV:       225 ml LVOT Area:     3.14 cm    LV ESV:       92 ml                            LV SV:        133 ml  LV Volumes (MOD) LV vol d, MOD A2C: 87.4 ml LV vol d, MOD A4C: 81.4 ml LV vol s, MOD A2C: 38.7 ml LV vol s, MOD A4C: 25.1 ml LV SV MOD A2C:     48.7 ml LV SV MOD A4C:     81.4 ml LV SV MOD BP:      54.4 ml RIGHT VENTRICLE            IVC RV S prime:     4.56 cm/s  IVC diam: 3.10 cm TAPSE (M-mode): 0.7 cm LEFT ATRIUM             Index       RIGHT ATRIUM           Index LA diam:        5.20 cm 2.45 cm/m  RA Area:     28.20 cm LA Vol (A2C):   65.3 ml 30.71 ml/m RA Volume:   92.30 ml  43.40 ml/m  LA Vol (A4C):   93.4 ml 43.92 ml/m LA Biplane Vol: 82.6 ml 38.84 ml/m  AORTIC VALVE AV Area (Vmax):    1.70 cm AV Area (Vmean):   1.64 cm AV Area (VTI):     1.55 cm AV Vmax:  238.00 cm/s AV Vmean:          154.000 cm/s AV VTI:            0.502 m AV Peak Grad:      22.7 mmHg AV Mean Grad:      11.0 mmHg LVOT Vmax:         129.00 cm/s LVOT Vmean:        80.500 cm/s LVOT VTI:          0.248 m LVOT/AV VTI ratio: 0.49  AORTA Ao Root diam: 3.70 cm Ao Asc diam:  4.30 cm MITRAL VALVE MV Area (PHT): 2.60 cm     SHUNTS MV Area VTI:   1.46 cm     Systemic VTI:  0.25 m MV Peak grad:  15.4 mmHg    Systemic Diam: 2.00 cm MV Mean grad:  3.5 mmHg MV Vmax:       1.96 m/s MV Vmean:      78.8 cm/s MV Decel Time: 292 msec MV E velocity: 177.00 cm/s Epifanio Lesches MD Electronically signed by Epifanio Lesches MD Signature Date/Time: 06/23/2021/4:07:23 PM    Final                LOS: 2 days   Eulogia Dismore  [facility]   Pager on [email or web address]. If 7PM-7AM, please contact night-coverage at [email or web address]     06/25/2021, 1:38 PM

## 2021-06-25 NOTE — Plan of Care (Signed)
  Problem: Health Behavior/Discharge Planning: Goal: Ability to manage health-related needs will improve Outcome: Progressing   Problem: Self-Care: Goal: Verbalization of feelings and concerns over difficulty with self-care will improve Outcome: Not Progressing Goal: Ability to communicate needs accurately will improve Outcome: Progressing

## 2021-06-25 NOTE — Progress Notes (Signed)
Occupational Therapy Treatment Patient Details Name: Carlos Welch MRN: [id number] DOB: Oct 06, 1946 Today's Date: 06/25/2021    History of present illness 75 y.o. 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 Pt seen in conjunction with PT to maximize pts activity tolerance and optimize pts participation. Pt continues to present with impaired cognition, visual deficits, impaired balance, and impaired coordination and sensation in RUE/RLE. Pt currently requires MAX A +2 for stand pivot transfers with no AD. Pt completed seated ADL tasks with set-up - supervision assist. Pt would continue to benefit from skilled occupational therapy while admitted and after d/c to address the below listed limitations in order to improve overall functional mobility and facilitate independence with BADL participation. DC plan remains appropriate, will follow acutely per POC.     Follow Up Recommendations  CIR    Equipment Recommendations  Other (comment) (TBA)    Recommendations for Other Services      Precautions / Restrictions Precautions Precautions: Fall;Other (comment) Precaution Comments: R inattention, decr R sensation Restrictions Weight Bearing Restrictions: No       Mobility Bed Mobility Overal bed mobility: Needs Assistance Bed Mobility: Supine to Sit     Supine to sit: Mod assist;HOB elevated     General bed mobility comments: increased time, cues for sequencing, assist with RLE and to elevate trunk    Transfers Overall transfer level: Needs assistance Equipment used: Rolling walker (2 wheeled);2 person hand held assist Transfers: Sit to/from Stand;Stand Pivot Transfers Sit to Stand: +2 physical assistance;+2 safety/equipment;Max assist Stand  pivot transfers: +2 physical assistance;Max assist       General transfer comment: assist to power up and stabilize balance. Sit to stand with RW with assist to maintain grip R hand. SPT bed to recliner without AD. Pt unable to take pivot steps.    Balance Overall balance assessment: Needs assistance Sitting-balance support: Single extremity supported;Feet supported Sitting balance-Leahy Scale: Fair Sitting balance - Comments: R lateral lean and needs cues to self correct Postural control: Right lateral lean;Posterior lean Standing balance support: Bilateral upper extremity supported;During functional activity Standing balance-Leahy Scale: Zero Standing balance comment: attempting dynamic reaching at sink with R lateral lean worsening needind MAX A +2 to maintain midline positioning                           ADL either performed or assessed with clinical judgement   ADL Overall ADL's : Needs assistance/impaired     Grooming: Wash/dry face;Sitting;Supervision/safety;Set up Grooming Details (indicate cue type and reason): sitting in recliner at sink Upper Body Bathing: Moderate assistance;Sitting Upper Body Bathing Details (indicate cue type and reason): simulated via applying lotion to RUE             Toilet Transfer: +2 for physical assistance;Maximal assistance Toilet Transfer Details (indicate cue type and reason): simulated via stand pivot transfer from EOB >recliner         Functional mobility during ADLs: Maximal assistance;+2 for physical assistance General ADL Comments: pt continues to present with impaired balance, impaired coordination in RUE/RLE, and impaired cognition     Vision Patient Visual Report: Diplopia Additional Comments: pt initally present with eye patch over R eye but noted to remove patch when asking  pt to complete visual tasks, donned occlusion glasses with nasal porition taped on pts L side with pt reporting improvement with diplopia    Perception     Praxis      Cognition Arousal/Alertness: Awake/alert Behavior During Therapy: Flat affect;Impulsive Overall Cognitive Status: Impaired/Different from baseline Area of Impairment: Orientation;Attention;Memory;Following commands;Awareness;Safety/judgement;Problem solving                 Orientation Level: Disoriented to;Situation;Place Current Attention Level: Selective Memory: Decreased short-term memory;Decreased recall of precautions Following Commands: Follows one step commands consistently;Follows one step commands with increased time Safety/Judgement: Decreased awareness of deficits;Decreased awareness of safety Awareness: Intellectual Problem Solving: Slow processing;Difficulty sequencing;Requires verbal cues;Requires tactile cues General Comments: pt unable to state location noted to be impulsive with mobility, follows commands with increased time. poor awareness into deficits noted likely d/t R inattention, unable to determine R vs L side        Exercises     Shoulder Instructions       General Comments Pt's  on forehead on arrival. SpO2 93%. Remained on RA during session with SpO2 91% after activity. RN notified pt left on RA. had lotion apply lotion to pts RUE with pt needing MAX multimodal cues to locate R arm and attend to R side. pt was able to cross midline when washing pts face with tactile and verbal cues    Pertinent Vitals/ Pain       Pain Assessment: Faces Faces Pain Scale: Hurts a little bit Pain Location: generalized Pain Descriptors / Indicators: Discomfort Pain Intervention(s): Monitored during session;Repositioned  Home Living                                          Prior Functioning/Environment              Frequency  Min 2X/week        Progress Toward Goals  OT Goals(current goals can now be found in the care plan section)  Progress towards OT goals: Progressing toward goals  Acute Rehab OT  Goals Patient Stated Goal: not stated Time For Goal Achievement: 07/08/21 Potential to Achieve Goals: Fair  Plan Discharge plan remains appropriate;Frequency remains appropriate    Co-evaluation    PT/OT/SLP Co-Evaluation/Treatment: Yes Reason for Co-Treatment: Complexity of the patient's impairments (multi-system involvement);For patient/therapist safety;To address functional/ADL transfers;Necessary to address cognition/behavior during functional activity PT goals addressed during session: Mobility/safety with mobility;Balance;Proper use of DME OT goals addressed during session: ADL's and self-care      AM-PAC OT "6 Clicks" Daily Activity     Outcome Measure   Help from another person eating meals?: A Lot Help from another person taking care of personal grooming?: A Lot Help from another person toileting, which includes using toliet, bedpan, or urinal?: A Lot Help from another person bathing (including washing, rinsing, drying)?: A Lot Help from another person to put on and taking off regular upper body clothing?: A Lot Help from another person to put on and taking off regular lower body clothing?: Total 6 Click Score: 11    End of Session Equipment Utilized During Treatment: Gait belt;Rolling walker  OT Visit Diagnosis: Unsteadiness on feet (R26.81);Muscle weakness (generalized) (M62.81);History of falling (Z91.81)   Activity Tolerance Patient tolerated treatment well   Patient Left in chair;with call bell/phone within reach;with chair alarm set   Nurse Communication Mobility status  Time: 3716-9678 OT Time Calculation (min): 23 min  Charges: OT General Charges $OT Visit: 1 Visit OT Treatments $Self Care/Home Management : 8-22 mins  Lenor Derrick., COTA/L Acute Rehabilitation Services [phone]    Barron Schmid 06/25/2021, 10:55 AM

## 2021-06-25 NOTE — Progress Notes (Signed)
Physical Therapy Treatment Patient Details Name: Carlos Welch MRN: [id number] DOB: 10-18-1945 Today's Date: 06/25/2021    History of Present Illness 75 y.o. 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 He required mod assist supine to sit, +2 max assist sit to stand, and +2 max assist SPT without AD. Pt able to static stand with +2 max assist, BUE supported on RW but unable to progress gait. Pt continues to report decreased sensation R side and double vision. R side inattention persists. Pt in recliner with feet elevated at end of session.    Follow Up Recommendations  CIR;Supervision/Assistance - 24 hour     Equipment Recommendations  Other (comment) (TBA)    Recommendations for Other Services       Precautions / Restrictions Precautions Precautions: Fall;Other (comment) Precaution Comments: R inattention, decr R sensation    Mobility  Bed Mobility Overal bed mobility: Needs Assistance Bed Mobility: Supine to Sit     Supine to sit: Mod assist;HOB elevated     General bed mobility comments: increased time, cues for sequencing, assist with RLE and to elevate trunk    Transfers Overall transfer level: Needs assistance Equipment used: Rolling walker (2 wheeled);2 person hand held assist Transfers: Sit to/from Stand;Stand Pivot Transfers Sit to Stand: +2 physical assistance;+2 safety/equipment;Max assist Stand pivot transfers: +2 physical assistance;Max assist       General transfer comment: assist to power up and stabilize balance. Sit to stand with RW with assist to maintain grip R hand. SPT bed to recliner without AD. Pt unable to take pivot steps.  Ambulation/Gait Ambulation/Gait assistance: +2 physical assistance;Max  assist Gait Distance (Feet): 1 Feet Assistive device: Rolling walker (2 wheeled) Gait Pattern/deviations: Decreased dorsiflexion - right;Ataxic;Staggering right     General Gait Details: Step forward with RLE, BUE supported on RW.  Significant LOB/lean toward R with inability to correct/return to midline. Required return to sitting.   Stairs             Wheelchair Mobility    Modified Rankin (Stroke Patients Only) Modified Rankin (Stroke Patients Only) Pre-Morbid Rankin Score: No significant disability Modified Rankin: Severe disability     Balance Overall balance assessment: Needs assistance Sitting-balance support: Single extremity supported;Feet supported Sitting balance-Leahy Scale: Fair   Postural control: Right lateral lean;Posterior lean Standing balance support: Bilateral upper extremity supported;During functional activity Standing balance-Leahy Scale: Zero Standing balance comment: +2 max to maintain stance                            Cognition Arousal/Alertness: Awake/alert Behavior During Therapy: Flat affect;Impulsive Overall Cognitive Status: Impaired/Different from baseline Area of Impairment: Orientation;Attention;Memory;Following commands;Awareness;Safety/judgement;Problem solving                 Orientation Level: Disoriented to;Situation;Place Current Attention Level: Selective Memory: Decreased short-term memory;Decreased recall of precautions Following Commands: Follows one step commands consistently;Follows one step commands with increased time Safety/Judgement: Decreased awareness of deficits;Decreased awareness of safety Awareness: Intellectual Problem Solving: Slow processing;Difficulty sequencing;Requires verbal cues;Requires tactile cues        Exercises      General Comments General comments (skin integrity, edema, etc.): Pt's [place] on forehead on arrival. SpO2 93%. Remained  on RA during session with SpO2 91% after  activity. RN notified pt left on RA.      Pertinent Vitals/Pain Pain Assessment: Faces Faces Pain Scale: Hurts a little bit Pain Location: generalized Pain Descriptors / Indicators: Discomfort Pain Intervention(s): Monitored during session;Repositioned    Home Living                      Prior Function            PT Goals (current goals can now be found in the care plan section) Acute Rehab PT Goals Patient Stated Goal: not stated Progress towards PT goals: Progressing toward goals    Frequency    Min 4X/week      PT Plan Current plan remains appropriate    Co-evaluation PT/OT/SLP Co-Evaluation/Treatment: Yes Reason for Co-Treatment: Complexity of the patient's impairments (multi-system involvement);For patient/therapist safety;To address functional/ADL transfers;Necessary to address cognition/behavior during functional activity PT goals addressed during session: Mobility/safety with mobility;Balance;Proper use of DME        AM-PAC PT "6 Clicks" Mobility   Outcome Measure  Help needed turning from your back to your side while in a flat bed without using bedrails?: A Lot Help needed moving from lying on your back to sitting on the side of a flat bed without using bedrails?: A Lot Help needed moving to and from a bed to a chair (including a wheelchair)?: Total Help needed standing up from a chair using your arms (e.g., wheelchair or bedside chair)?: Total Help needed to walk in hospital room?: Total Help needed climbing 3-5 steps with a railing? : Total 6 Click Score: 8    End of Session Equipment Utilized During Treatment: Gait belt Activity Tolerance: Patient tolerated treatment well;Patient limited by fatigue Patient left: in chair;with call bell/phone within reach;with chair alarm set Nurse Communication: Mobility status PT Visit Diagnosis: Other abnormalities of gait and mobility (R26.89);Other symptoms and signs involving the nervous system  (R29.898);Hemiplegia and hemiparesis Hemiplegia - Right/Left: Right Hemiplegia - dominant/non-dominant: Dominant Hemiplegia - caused by: Nontraumatic intracerebral hemorrhage     Time: 0901-0930 PT Time Calculation (min) (ACUTE ONLY): 29 min  Charges:  $Therapeutic Activity: 8-22 mins                     Aida Raider, PT  Office # [phone] Pager [phone]    Ilda Foil 06/25/2021, 10:37 AM

## 2021-06-25 NOTE — Plan of Care (Signed)
  Problem: Education: Goal: Knowledge of disease or condition will improve 06/25/2021 1701 by Melvenia Needles, RN Outcome: Progressing 06/25/2021 1701 by Melvenia Needles, RN Outcome: Progressing Goal: Knowledge of secondary prevention will improve 06/25/2021 1701 by Melvenia Needles, RN Outcome: Progressing 06/25/2021 1701 by Melvenia Needles, RN Outcome: Progressing Goal: Knowledge of patient specific risk factors addressed and post discharge goals established will improve 06/25/2021 1701 by Melvenia Needles, RN Outcome: Progressing 06/25/2021 1701 by Melvenia Needles, RN Outcome: Progressing Goal: Individualized Educational Video(s) 06/25/2021 1701 by Melvenia Needles, RN Outcome: Progressing 06/25/2021 1701 by Melvenia Needles, RN Outcome: Progressing   Problem: Coping: Goal: Will verbalize positive feelings about self 06/25/2021 1701 by Melvenia Needles, RN Outcome: Progressing 06/25/2021 1701 by Melvenia Needles, RN Outcome: Progressing Goal: Will identify appropriate support needs 06/25/2021 1701 by Melvenia Needles, RN Outcome: Progressing 06/25/2021 1701 by Melvenia Needles, RN Outcome: Progressing

## 2021-06-25 NOTE — Progress Notes (Signed)
Inpatient Rehab Admissions:  Inpatient Rehab Consult received.  I met with pt and daughter at the bedside for rehabilitation assessment and to discuss goals and expectations of an inpatient rehab admission.  Both acknowledged understanding of goals and expectations. Both interested in pt pursuing CIR. Will continue to follow  Signed: Gayland Curry, [place], [place] Admissions Coordinator [phone]

## 2021-06-25 NOTE — PMR Pre-admission (Signed)
PMR Admission Coordinator Pre-Admission Assessment  Patient: Carlos Welch is an 75 y.o., male MRN: [id number] DOB: [birth year] Height: 6\' 2"  (188 cm) Weight: 86.2 kg  Insurance Information HMO:     PPO:      PCP:      IPA:      80/20: yes     OTHER:  PRIMARY: Medicare A & B      Policy#: [id number]      Subscriber: patient CM Name:    NA   Phone#: NA     Fax#: NA Pre-Cert#:   NA    Employer: NA Benefits:  Phone #: verified eligibility online via [employer] on 06/25/21     Name: NA Eff. Date: Part A effective 07/16/12, Part B effective 10/17/19     Deduct: $1,556      Out of Pocket Max: NA      Life Max: NA CIR: 100%      SNF: 100% days 1-20, 80% days 21-100 Outpatient: 80%     Co-Pay: 20% Home Health: 100%      Co-Pay:  DME: 80%     Co-Pay: 20% Providers: pt's choice SECONDARY:    NA   Policy#:     Phone#:   Financial Counselor:     NA  Phone#:   The 12/15/19 Information Summary" for patients in Inpatient Rehabilitation Facilities with attached "Privacy Act Statement-Health Care Records" was provided and verbally reviewed with: Patient and Family  Emergency Contact Information Contact Information     Name Relation Home Work Mobile   Hearn,Heather Daughter   [phone]       Current Medical History  Patient Admitting Diagnosis: intracerebral hemorrhage History of Present Illness: Pt is a 75 year old male with medical hx significant for: HTN, HLD, A-fib, aortic stenosis s/p valve replacement, TIA, BPH, CAD s/p CABG, thrombocytopenia, bilateral lower extremity cellulitis, combined CHF.  Pt presented to ED via EMS on 9/8.  Code stroke activated by EMS. Pt developed abrupt right sided weakness and numbness. Also had slurred speech. CT head showed left thalamic bleed.  CTA was negative for LVO, CTA spot sign, or intracranial aneursym. Echo revealed EF of 60-65%. MRI on 9/9 showed stable hematoma, no underlying mass lesion.  Therapy evaluations completed and CIR recommended d/t pt's  functional mobility deficits, cognitive deficits, and inability to perform ADLs independently.  Complete NIHSS TOTAL: 5  Patient's medical record from [facility] has been reviewed by the rehabilitation admission coordinator and physician.  Past Medical History  Past Medical History:  Diagnosis Date   A-fib (HCC)    Abscess of jaw 07/19/2020   Acute on chronic heart failure with preserved ejection fraction (HCC) 07/08/2020   Aortic stenosis    a. s/p pericardial AVR 2015.   Aortic valve stenosis 07/19/2020   Bacteremia 07/19/2020   Bacterial endocarditis    BPH (benign prostatic hyperplasia) 07/08/2020   CAD (coronary artery disease)    a. s/p CABG, AVR, LAA clipping 2015 at [facility].   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/08/2020   Elevated troponin 07/08/2020   Essential hypertension 07/08/2020   Generalized abdominal pain  History of noncompliance with medical treatment    Hyperlipemia 07/08/2020   Infection associated with implant ([employer]) 07/19/2020   Malnutrition ([place])    MRSA bacteremia    Nonrheumatic mitral valve regurgitation 07/19/2020   Normocytic anemia 07/08/2020   Pressure injury of skin 05/12/2020   S/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 2015   Severe malnutrition ([place]) 07/19/2020   Thrombocytopenia ([place]) 07/08/2020   TIA (transient ischemic attack)    Weight loss 07/19/2020    Has the patient had major surgery during 100 days prior to admission? No  Family History   family history is not on file.  Current Medications  Current Facility-Administered Medications:     acetaminophen (TYLENOL) tablet 650 mg, 650 mg, Oral, Q4H PRN, 650 mg at 06/23/21 2004 **OR** acetaminophen (TYLENOL) 160 MG/5ML solution 650 mg, 650 mg, Per Tube, Q4H PRN **OR** acetaminophen (TYLENOL) suppository 650 mg, 650 mg, Rectal, Q4H PRN, Amie Portland, MD   Chlorhexidine Gluconate Cloth 2 % PADS 6 each, 6 each, Topical, Daily, Rosalin Hawking, MD, 6 each at 06/27/21 1018   doxycycline (VIBRA-TABS) tablet 100 mg, 100 mg, Oral, BID, Rosalin Hawking, MD, 100 mg at 06/27/21 1017   furosemide (LASIX) tablet 80 mg, 80 mg, Oral, Daily, Rosalin Hawking, MD, 80 mg at 06/27/21 1016   gabapentin (NEURONTIN) capsule 100 mg, 100 mg, Oral, QHS, Rosalin Hawking, MD, 100 mg at 06/26/21 2215   hydrALAZINE (APRESOLINE) injection 5-10 mg, 5-10 mg, Intravenous, Q4H PRN, Rosalin Hawking, MD, 10 mg at 06/27/21 0405   hydrALAZINE (APRESOLINE) tablet 50 mg, 50 mg, Oral, Q8H, Rosalin Hawking, MD, 50 mg at 06/27/21 0555   HYDROcodone-acetaminophen (NORCO/VICODIN) 5-325 MG per tablet 1 tablet, 1 tablet, Oral, Q6H PRN, Amie Portland, MD, 1 tablet at 06/26/21 0139   losartan (COZAAR) tablet 100 mg, 100 mg, Oral, Daily, Rosalin Hawking, MD, 100 mg at 06/27/21 1016   MEDLINE mouth rinse, 15 mL, Mouth Rinse, BID, Rosalin Hawking, MD, 15 mL at 06/27/21 1018   ondansetron (ZOFRAN) injection 4 mg, 4 mg, Intravenous, Q6H PRN, Amie Portland, MD, 4 mg at 06/23/21 0431   pantoprazole (PROTONIX) EC tablet 40 mg, 40 mg, Oral, Daily, Rosalin Hawking, MD, 40 mg at 06/27/21 1017   senna-docusate (Senokot-S) tablet 1 tablet, 1 tablet, Oral, BID, Amie Portland, MD, 1 tablet at 06/27/21 1016  Patients Current Diet:  Diet Order             DIET DYS 2 Room service appropriate? No; Fluid consistency: Nectar Thick  Diet effective now                   Precautions / Restrictions Precautions Precautions: Fall, Other (comment) Precaution Comments: R inattention, decr R sensation Restrictions Weight Bearing Restrictions: No   Has the patient had 2 or more falls  or a fall with injury in the past year? No  Prior Activity Level Limited Community (1-2x/wk): out of house 1 day/week  Prior Functional Level Self Care: Did the patient need help bathing, dressing, using the toilet or eating? Independent  Indoor Mobility: Did the patient need assistance with walking from room to room (with or without device)? Independent  Stairs: Did the patient need assistance with internal or external stairs (with or without device)? Pt did not utilize steps  Functional Cognition: Did the patient need help planning regular tasks such as shopping or remembering to take medications? Dependent  Patient Information Are you of Hispanic, Latino/a,or Spanish origin?: X. Patient unable to respond, A. No,  not of Hispanic, Latino/a, or Spanish origin What is your race?: X. Patient unable to respond, A. White Do you need or want an interpreter to communicate with a doctor or health care staff?: 0. No  Patient's Response To:  Health Literacy and Transportation Is the patient able to respond to health literacy and transportation needs?: No (daughter responded that pt rarely needs assistance with medical reading.  Also responded that transportation has not kept him from medical appointments or non-medical appointments)  [place] / Equipment Home Equipment: Grab bars - tub/shower, Hand held shower head, Cane - single point, Environmental consultant - 2 wheels  Prior Device Use: Indicate devices/aids used by the patient prior to current illness, exacerbation or injury?  cane  Current Functional Level Cognition  Arousal/Alertness: Awake/alert Overall Cognitive Status: Impaired/Different from baseline Current Attention Level: Selective Orientation Level: Oriented to person Following Commands: Follows one step commands consistently, Follows one step commands with increased time Safety/Judgement: Decreased awareness of deficits, Decreased awareness of safety General Comments: pt unable  to state location noted to be impulsive with mobility, follows commands with increased time. poor awareness into deficits noted likely d/t R inattention, unable to determine R vs L side Awareness: Impaired Awareness Impairment: Intellectual impairment, Emergent impairment Problem Solving: Impaired Problem Solving Impairment: Functional basic Executive Function: Self Monitoring, Self Correcting Self Monitoring: Impaired (Impaired for functional, intact for verbal) Self Monitoring Impairment: Functional basic Self Correcting: Impaired (Impaired for functional, intact for verbal) Self Correcting Impairment: Functional basic Safety/Judgment: Other (comment) (suspect impaired)    Extremity Assessment (includes Sensation/Coordination)  Upper Extremity Assessment: Defer to OT evaluation  Lower Extremity Assessment: RLE deficits/detail, LLE deficits/detail RLE Deficits / Details: AROM WFL, strength hip flexion 3-/5, knee extension 3+/5, ankle DF 3/5, limited due to poor awareness/proprioception RLE Sensation: decreased proprioception, decreased light touch RLE Coordination: decreased gross motor LLE Deficits / Details: AROM WFL, strength at least 3+ to 4/5    ADLs  Overall ADL's : Needs assistance/impaired Eating/Feeding: Maximal assistance, Bed level Eating/Feeding Details (indicate cue type and reason): pt verbalized being hungry but does not initiate self feeding. pt needs cues to clear R side of mouth throughout session. pt with dentures Grooming: Wash/dry face, Sitting, Supervision/safety, Set up Grooming Details (indicate cue type and reason): sitting in recliner at sink Upper Body Bathing: Moderate assistance, Sitting Upper Body Bathing Details (indicate cue type and reason): simulated via applying lotion to RUE Lower Body Bathing: Total assistance Upper Body Dressing : Maximal assistance Lower Body Dressing: Total assistance Toilet Transfer: +2 for physical assistance, Maximal  assistance Toilet Transfer Details (indicate cue type and reason): simulated via stand pivot transfer from EOB >recliner Functional mobility during ADLs: Maximal assistance, +2 for physical assistance General ADL Comments: pt continues to present with impaired balance, impaired coordination in RUE/RLE, and impaired cognition    Mobility  Overal bed mobility: Needs Assistance Bed Mobility: Supine to Sit Supine to sit: Mod assist, HOB elevated General bed mobility comments: increased time, cues for sequencing, assist with RLE and to elevate trunk    Transfers  Overall transfer level: Needs assistance Equipment used: Rolling walker (2 wheeled), 2 person hand held assist Transfers: Sit to/from Stand, Stand Pivot Transfers Sit to Stand: +2 physical assistance, +2 safety/equipment, Max assist Stand pivot transfers: +2 physical assistance, Max assist General transfer comment: assist to power up and stabilize balance. Sit to stand with RW with assist to maintain grip R hand. SPT bed to recliner without AD. Pt unable to take  pivot steps.    Ambulation / Gait / Stairs / Wheelchair Mobility  Ambulation/Gait Ambulation/Gait assistance: +2 physical assistance, Max assist Gait Distance (Feet): 1 Feet Assistive device: Rolling walker (2 wheeled) Gait Pattern/deviations: Decreased dorsiflexion - right, Ataxic, Staggering right General Gait Details: Step forward with RLE, BUE supported on RW.  Significant LOB/lean toward R with inability to correct/return to midline. Required return to sitting.    Posture / Balance Dynamic Sitting Balance Sitting balance - Comments: R lateral lean and needs cues to self correct Balance Overall balance assessment: Needs assistance Sitting-balance support: Single extremity supported, Feet supported Sitting balance-Leahy Scale: Fair Sitting balance - Comments: R lateral lean and needs cues to self correct Postural control: Right lateral lean, Posterior lean Standing  balance support: Bilateral upper extremity supported, During functional activity Standing balance-Leahy Scale: Zero Standing balance comment: attempting dynamic reaching at sink with R lateral lean worsening needind MAX A +2 to maintain midline positioning    Special needs/care consideration Skin Cellulitis-leg/bilateral; Ecchymosis-leg/bilateral, Bladder incontinence, External urinary catheter and Designated visitor Pecola Leisure, daughter   Previous Home Environment (from acute therapy documentation) Living Arrangements: Children, Other (Comment) (grandson)  Lives With: Daughter, Other (Comment) (grandson) Available Help at Discharge: Family, Available 24 hours/day Type of Home: House Home Layout: One level Home Access: Level entry Bathroom Shower/Tub: Tub/shower unit, Multimedia programmer: Handicapped height Bathroom Accessibility: Yes How Accessible: Accessible via walker Home Care Services: No Additional Comments: information from prior admission and from patient report  Discharge Living Setting Plans for Discharge Living Setting: Lives with (comment) (lives with daughter and grandson) Type of Home at Discharge: House Discharge Home Layout: One level Discharge Home Access: Level entry Discharge Bathroom Shower/Tub: Tub/shower unit, Walk-in shower Discharge Bathroom Toilet: Handicapped height Discharge Bathroom Accessibility: Yes How Accessible: Accessible via walker Does the patient have any problems obtaining your medications?: No  Social/Family/Support Systems Anticipated Caregiver: Pecola Leisure, daughter and grandson Anticipated Caregiver's Contact Information: [phone] Caregiver Availability: 24/7 Discharge Plan Discussed with Primary Caregiver: Yes Is Caregiver In Agreement with Plan?: Yes Does Caregiver/Family have Issues with Lodging/Transportation while Pt is in Rehab?: No  Goals Patient/Family Goal for Rehab: Supervision-Min A: PT/OT Expected  length of stay: 14-16 days Pt/Family Agrees to Admission and willing to participate: Yes Program Orientation Provided & Reviewed with Pt/Caregiver Including Roles  & Responsibilities: Yes  Decrease burden of Care through IP rehab admission: NA  Possible need for SNF placement upon discharge: Not anticipated  Patient Condition: I have reviewed medical records from [facility], spoken with CM, and patient and daughter. I met with patient at the bedside for inpatient rehabilitation assessment.  Patient will benefit from ongoing PT, OT, and SLP, can actively participate in 3 hours of therapy a day 5 days of the week, and can make measurable gains during the admission.  Patient will also benefit from the coordinated team approach during an Inpatient Acute Rehabilitation admission.  The patient will receive intensive therapy as well as Rehabilitation physician, nursing, social worker, and care management interventions.  Due to bladder management, safety, skin/wound care, disease management, medication administration, pain management, and patient education the patient requires 24 hour a day rehabilitation nursing.  The patient is currently Mod-Max +2 with mobility and Mod A with basic ADLs.  Discharge setting and therapy post discharge at home with home health is anticipated.  Patient has agreed to participate in the Acute Inpatient Rehabilitation Program and will admit today.  Preadmission Screen Completed By:  Runell Gess  Lind Covert, 06/27/2021 11:04 AM ______________________________________________________________________   Discussed status with Dr. Nechama Guard on 06/27/21  at 11:04 AM and received approval for admission today.  Admission Coordinator:  Bethel Born, CCC-SLP, time 11:04 AM/Date 06/27/21    Assessment/Plan: Diagnosis: Intracerebral hemorrhage Does the need for close, 24 hr/day Medical supervision in concert with the patient's rehab needs make it unreasonable for this  patient to be served in a less intensive setting? Yes Co-Morbidities requiring supervision/potential complications: generalized abdominal pain, pressure injury of skin, abscess of jaw, acute on chronic heart failure with preserved ejection fracture, aortic valve stenosis Due to bladder management, bowel management, safety, skin/wound care, disease management, medication administration, pain management, and patient education, does the patient require 24 hr/day rehab nursing? Yes Does the patient require coordinated care of a physician, rehab nurse, PT, OT, and SLP to address physical and functional deficits in the context of the above medical diagnosis(es)? Yes Addressing deficits in the following areas: balance, endurance, locomotion, strength, transferring, bowel/bladder control, bathing, dressing, feeding, grooming, toileting, swallowing, and psychosocial support Can the patient actively participate in an intensive therapy program of at least 3 hrs of therapy 5 days a week? Yes The potential for patient to make measurable gains while on inpatient rehab is excellent Anticipated functional outcomes upon discharge from inpatient rehab: min assist PT, min assist OT, min assist SLP Estimated rehab length of stay to reach the above functional goals is: 2-3 weeks Anticipated discharge destination: Home 10. Overall Rehab/Functional Prognosis: excellent   MD Signature: Leeroy Cha, MD

## 2021-06-25 NOTE — Progress Notes (Signed)
STROKE TEAM PROGRESS NOTE   INTERVAL HISTORY 76 year old male presenting with right sided weakness and numbness. Hospitalization has been unremarkable. No family member present at time of this exam. He has no complaints. He has eye patch in place. Looks more bright today than previous evaluation.     Vitals:   06/24/21 1845 06/24/21 2037 06/24/21 2324 06/25/21 0334  BP: (!) 153/80 (!) 164/84 (!) 148/91 (!) 138/94  Pulse: (!) 50 (!) 54 (!) 56 (!) 52  Resp: 16 18 18 16   Temp: 97.7 F (36.5 C) 98.3 F (36.8 C) 98.6 F (37 C) 98.3 F (36.8 C)  TempSrc: Oral Oral Oral Oral  SpO2: 100% 94% 100% 98%  Weight:      Height:       CBC:  Recent Labs  Lab 06/23/21 0355 06/23/21 0401 06/24/21 0219 06/25/21 0325  WBC 4.1  --  4.5 4.1  NEUTROABS 2.6  --   --   --   HGB 14.1   < > 12.9* 14.0  HCT 45.3   < > 41.0 42.6  MCV 89.7  --  88.0 86.6  PLT 124*  --  114* 126*   < > = values in this interval not displayed.    Basic Metabolic Panel:  Recent Labs  Lab 06/24/21 0219 06/25/21 0325  NA 138 138  K 3.9 3.6  CL 99 102  CO2 28 26  GLUCOSE 75 73  BUN 21 22  CREATININE 1.34* 1.15  CALCIUM 9.2 9.4     Lipid Panel:  Recent Labs  Lab 06/24/21 0219  CHOL 143  TRIG 72  HDL 37*  CHOLHDL 3.9  VLDL 14  LDLCALC 92     HgbA1c:  Recent Labs  Lab 06/23/21 0400  HGBA1C 5.2    Urine Drug Screen: No results for input(s): LABOPIA, COCAINSCRNUR, LABBENZ, AMPHETMU, THCU, LABBARB in the last 168 hours.  Alcohol Level  Recent Labs  Lab 06/23/21 0355  ETH <10     IMAGING past 24 hours No results found.  PHYSICAL EXAM  General: Awake alert in no distress HEENT: Normocephalic/atraumatic Lungs: Clear Cardiovascular: Irregularly irregular Abdomen nondistended nontender Extremities: Extensive cellulitis  Neurological exam Awake alert oriented to self, knows he is in hospital  Unable to tell me the date or time Speech is mildly dysarthric Poor attention concentration    Cranial nerves: Pupils equal round react light, extraocular movements appear unhindered.  blink to threat inconsistent, right lower facial weakness observed, tongue and palate midline. Motor examination with flaccid right upper extremity and  2/5 movement in the right lower extremity.  Left upper and lower extremity are normal Sensation: moderate sensory loss on the right hemibody. Coordination: No gross dysmetria on the left, unable to follow on the right.  ASSESSMENT/PLAN [name] is a 75 y.o. male with history of atrial fibrillation, on Eliquis, aortic stenosis, coronary artery disease, bilateral lower extremity cellulitis, combined CHF, essential hypertension, hyperlipidemia, thrombocytopenia, presented to the emergency room via EMS when the family called EMS for evaluation of abrupt onset of right-sided weakness and numbness.  The patient was last seen well at 8 PM on 06/22/2021 when he went to bed.  He was noted sometime in the middle of the night to be weak on the right side and have slurred speech.  EMTs also felt that he was a little confused and unable to answer all questions appropriately.  LVO positive code stroke was activated. He was seen and evaluated emergently  on the ER bridge-symptoms are consistent with right hemiparesis and right-sided sensory loss.  Noncontrasted head CT showed left thalamic bleed around 9 cc without IVH. CT angiography with no vascular malformation and delayed images negative for spot sign Likely hypertensive bleed due to coagulopathy from Eliquis and hypertension   On initial exam our team spoke with patient's daughter Carlos Welch over the phone.  She reports that he is very noncompliant with his medications.  He takes what ever he likes.  She was concerned that he was taking excessive gingerroot which she knows is a potent blood thinner and might have contributed to this.  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  Atrophy.   CTA head & neck: negative for LVO, CTA spot sign, or intracranial aneursym. Left PCA P3 segment irregularity and stenosis. Extensive calcified atherosclerosis. Severe bilateval V4 vetrtebral artery stenosis. 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 Stable size of intraparenchymal hematoma centered in the left thalamus with surrounding vasogenic edema and regional mass effect. 2. 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 3. No focus of abnormal contrast enhancement. 2D Echo: ef 60-65%, la mildly dilated, dilated ascending aorta LDL 92 HgbA1c 5.2 VTE prophylaxis - scd  Eliquis (apixaban) daily prior to admission, now on No antithrombotic due to cerebral hemorrhage  Therapy recommendations: CIR Disposition:   Pending We will sign off at this time but remain available to assist. Follow up with our group within one month of discharge.   Chronic Afib Mild bradycardia On Eliquis PTA Currently Eliquis on hold due to ICH  Hypertension Home meds:  hydralazine 50mg  every 8 hours, Coreg 6.25, Lasix and spironolactone Off Cleviprex now Resume hydralazine, Lasix and spironolactone.  Hold off Coreg due to bradycardia BP goal less than 160 given stable hematoma on repeat CT Long-term BP goal normotensive  Hyperlipidemia Home meds:  none LDL 92, goal less than 70 Hold off statin now for acute ICH Consider statin at discharge  Tobacco abuse Current smoker Smoking cessation counseling provided Pt is willing to quit  Other Stroke Risk Factors  Advanced Age >/= 70   Substance abuse - UDS:  THC POSITIVE Patient advised to stop using due to stroke risk. Congestive  heart failure CAD  Other Active Problems Chronic thrombocytopenia Recent bilateral cellulitis on doxycycline    Hospital day # 2      To contact Stroke Continuity provider, please refer to 76. After hours, contact General Neurology

## 2021-06-26 LAB — BASIC METABOLIC PANEL
Anion gap: 10 (ref 5–15)
BUN: 24 mg/dL — ABNORMAL HIGH (ref 8–23)
CO2: 28 mmol/L (ref 22–32)
Calcium: 9.8 mg/dL (ref 8.9–10.3)
Chloride: 100 mmol/L (ref 98–111)
Creatinine, Ser: 1.17 mg/dL (ref 0.61–1.24)
GFR, Estimated: >60 mL/min (ref >60)
Glucose, Bld: 84 mg/dL (ref 70–99)
Potassium: 3.4 mmol/L — ABNORMAL LOW (ref 3.5–5.1)
Sodium: 138 mmol/L (ref 135–145)

## 2021-06-26 LAB — CBC
HCT: 47.1 % (ref 39.0–52.0)
Hemoglobin: 15.2 g/dL (ref 13.0–17.0)
MCH: 28.1 pg (ref 26.0–34.0)
MCHC: 32.3 g/dL (ref 30.0–36.0)
MCV: 87.1 fL (ref 80.0–100.0)
Platelets: 149 10*3/uL — ABNORMAL LOW (ref 150–400)
RBC: 5.41 MIL/uL (ref 4.22–5.81)
RDW: 19 % — ABNORMAL HIGH (ref 11.5–15.5)
WBC: 4.8 10*3/uL (ref 4.0–10.5)
nRBC: 0 % (ref 0.0–0.2)

## 2021-06-26 MED ORDER — POTASSIUM CHLORIDE CRYS ER 20 MEQ PO TBCR
40.0000 meq | EXTENDED_RELEASE_TABLET | Freq: Two times a day (BID) | ORAL | Status: AC
  Administered 2021-06-26 (×2): 40 meq via ORAL
  Filled 2021-06-26 (×2): qty 2

## 2021-06-26 NOTE — Progress Notes (Signed)
PROGRESS NOTE    Carlos Welch  OZH:[id number] DOB: [birth year] DOA: 06/23/2021 PCP: [name], PA-C   Brief Narrative:  The patient is a 75 year old Caucasian male with past medical history significant for but not limited to atrial fibrillation was previously on anticoagulation with Eliquis, aortic stenosis, coronary artery disease, bilateral lower extremity cellulitis, combined CHF, essential hypertension, hyperlipidemia, thrombocytopenia who presented to the hospital for confusion and right-sided weakness and numbness and was found to have a left-sided thalamic ICH.  He was admitted to the neuro ICU and transferred to [facility] service on 06/25/21.  PT OT evaluated recommending CIR currently awaiting bed placement  Assessment & Plan:   Active Problems:   Intracerebral hemorrhage (HCC)  Left Thalamic ICH  -In the setting of HTN and use of Anticoagulation with Eliquis  -CT Head showed "Acute hemorrhage centered in the left thalamus with an estimated volume of 9 mL. No intraventricular or extra-axial extension of blood. Surrounding edema but only mild regional mass effect. 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 There is mild left PCA P3 segment irregularity and stenosis. Positive for bilateral cervical ICA pseudoaneurysms, 13 x 20 mm on the right and 9 x 9 mm on the left. This raises the  possibility of underlying Fibromuscular Dysplasia (FMD). There is no  aneurism-associated stenosis. Positive also for extensive Calcified Atherosclerosis. 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 There is only mild ICA siphon and Basilar Artery stenosis. Aortic Atherosclerosis (ICD10-I70.0). Partially  visible chronic lung disease, prior CABG." -MRI showed "Stable size  of intraparenchymal hematoma centered in the left thalamus with surrounding vasogenic edema and regional mass effect. 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 No focus of abnormal contrast enhancement.." -Repeat CT Head showed "Overall unchanged acute hemorrhage centered in the left thalamus, without intraventricular or extra-axial extension of blood. Unchanged mild surrounding edema and mild regional mass effect." -Eliquis has been held and Neurology recommending no  -Lipid Panel as below and HbA1c was 5.2 -ECHOCARDIOGRAM showed 60-65% and showed "No intracardiac source of embolism detected on this transthoracic study. A transesophageal echocardiogram is recommended to exclude cardiac source of embolism if clinically indicated."  -PT/OT recommending CIR -Follow up with Neurology within 4 weeks of Discharge  Permanent Atrial Fibrillation -BB held due to Bradycardia -Anticoagulation held due to above   HTN -Now off of Cleviprex gtt -C/w Hydralazine 50 mg po q8h and Losartan 100 mg po Daily -C/w Hydralazine 5-10 mg IV q4hprn HBP SBP >160 -His BB is currently is on hold due to Bradycardia -C/w Furosemide 80 mg po Daily -Continue to Monitor BP per Protocol; BP Goal is <160 given stable Hematoma on Repeat CT -Last BP reading was 153/100  CAD -S/p CABG, AVR, and LAA Clipping  -Meds as above -Not complaining of any Chest Pain   Chronic Combined Systolic and Diastolic CHF -C/w Meds as as above -Strict I's and O's and Daily Weights  -Continue to Monitor for S/Sx of Volume Overload  HLD -Lipid Panel showed total cholesterol/HDL ratio ratio of 3.9, cholesterol of 143, HDL of 37, LDL of 92, triglycerides of 72, VLDL 14 -Currently holding Statin given ICH -Per Neurology consider Statin at D/C   Tobacco Abuse -Smoking Cessation counseling given  Recent Bilateral Leg Cellulitis -C/w Doxycycline 100 mg po BID  Thrombocytopenia -Improving and appears chronic -Patient's Platelet Count went from 114 -> 126 -> 149 -Continue to Monitor for S/Sx of Bleeding; No overt bleeding noted -Repeat CBC in the AM  Hypokalemia -Patient's K+ was mild at 3.4 -Replete with po Kcl 40 mEQ BID x2 -Continue to Monitor and Replete as Necessary -Repeat CMP in the AM   DVT prophylaxis: SCDs Code Status: FULL CODE Family Communication: No family present at bedside  Disposition Plan: CIR  Status is: Inpatient  Remains inpatient appropriate because:Unsafe d/c plan, IV treatments appropriate due to intensity of illness or inability to take PO, and Inpatient level of care appropriate due to severity of illness  Dispo: The patient is from: Home              Anticipated d/c is to: CIR              Patient currently is medically stable to d/c.   Difficult to place patient No  Consultants:  Neurology   Procedures:  ECHOCARDIOGRAM IMPRESSIONS     1. Left ventricular ejection fraction, by estimation, is 60 to 65%. The  left ventricle has normal function. The left ventricle has no regional  wall motion abnormalities. There is moderate left ventricular hypertrophy.  Left ventricular diastolic  parameters are indeterminate.   2. Right ventricular systolic function is moderately reduced. The right  ventricular size is mildly enlarged. Tricuspid regurgitation signal is  inadequate for assessing PA pressure.   3. Left atrial size was mildly dilated.   4. Right atrial size was moderately dilated.   5. The mitral valve is abnormal. Moderate mitral annular calcification.  Eccentric mitral regurgitation jet, poorly visualized on current study,  however was moderate to severe on TEE 04/2020. There does appear to be  pulmonic vein systolic reversal, which  suggests severe MR. Consider TEE for further evaluation   6. 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 Echo findings are  consistent with normal structure and function of the aortic valve  prosthesis. Aortic valve mean gradient measures 11.0 mmHg, stable from  prior echoes   7. Aortic dilatation noted. There is dilatation of the ascending aorta,  measuring 43 mm.   8. The inferior vena cava is dilated in size with <50% respiratory  variability, suggesting right atrial pressure of 15 mmHg.   Conclusion(s)/Recommendation(s): No intracardiac source of embolism  detected on this transthoracic study. A transesophageal echocardiogram is  recommended to exclude cardiac source of embolism if clinically indicated.   FINDINGS   Left Ventricle: Left ventricular ejection fraction, by estimation, is 60  to 65%. The left ventricle has normal function. The left ventricle has no  regional wall motion abnormalities. The left ventricular internal cavity  size was normal in size. There is   moderate left ventricular hypertrophy. Left ventricular diastolic  parameters are indeterminate.   Right Ventricle: The right ventricular size is mildly enlarged. Right  vetricular wall thickness was not well visualized. Right ventricular  systolic function is moderately reduced. Tricuspid regurgitation signal is  inadequate for assessing PA pressure.   Left Atrium: Left atrial size was mildly dilated.   Right Atrium: Right atrial size was moderately dilated.   Pericardium: There is no evidence of pericardial effusion.   Mitral Valve: The mitral valve is abnormal. Moderate mitral annular  calcification. Moderate to severe mitral valve regurgitation.  MV peak  gradient, 15.4 mmHg. The mean mitral valve gradient is 3.5 mmHg.   Tricuspid Valve: The tricuspid valve is normal in structure. Tricuspid  valve regurgitation is not demonstrated.   Aortic Valve: The aortic valve has been repaired/replaced. Aortic valve  regurgitation is not  visualized. Aortic valve mean gradient measures 11.0  mmHg. Aortic valve peak gradient measures 22.7 mmHg. Aortic valve area, by  VTI measures 1.55 cm. There is a   bioprosthetic valve present in the aortic position. Echo findings are  consistent with normal structure and function of the aortic valve  prosthesis.   Pulmonic Valve: The pulmonic valve was not well visualized. Pulmonic valve  regurgitation is not visualized.   Aorta: The aortic root is normal in size and structure and aortic  dilatation noted. There is dilatation of the ascending aorta, measuring 43  mm.   Venous: The inferior vena cava is dilated in size with less than 50%  respiratory variability, suggesting right atrial pressure of 15 mmHg.   IAS/Shunts: The interatrial septum was not well visualized.      LEFT VENTRICLE  PLAX 2D  LVIDd:         5.50 cm  LVIDs:         4.00 cm  LV PW:         1.40 cm  LV IVS:        1.40 cm  LVOT diam:     2.00 cm     3D Volume EF:  LV SV:         78          3D EF:        59 %  LV SV Index:   37          LV EDV:       225 ml  LVOT Area:     3.14 cm    LV ESV:       92 ml                             LV SV:        133 ml     LV Volumes (MOD)  LV vol d, MOD A2C: 87.4 ml  LV vol d, MOD A4C: 81.4 ml  LV vol s, MOD A2C: 38.7 ml  LV vol s, MOD A4C: 25.1 ml  LV SV MOD A2C:     48.7 ml  LV SV MOD A4C:     81.4 ml  LV SV MOD BP:      54.4 ml   RIGHT VENTRICLE            IVC  RV S prime:     4.56 cm/s  IVC diam: 3.10 cm  TAPSE (M-mode): 0.7 cm   LEFT ATRIUM             Index       RIGHT ATRIUM           Index  LA diam:        5.20 cm 2.45 cm/m  RA Area:     28.20 cm  LA Vol (A2C):   65.3 ml 30.71 ml/m RA Volume:   92.30 ml  43.40 ml/m  LA Vol (A4C):   93.4 ml 43.92 ml/m  LA Biplane Vol: 82.6 ml 38.84 ml/m   AORTIC VALVE  AV Area (Vmax):    1.70 cm  AV Area (Vmean):  1.64 cm  AV Area (VTI):     1.55 cm  AV Vmax:           238.00 cm/s  AV Vmean:          154.000  cm/s  AV VTI:            0.502 m  AV Peak Grad:      22.7 mmHg  AV Mean Grad:      11.0 mmHg  LVOT Vmax:         129.00 cm/s  LVOT Vmean:        80.500 cm/s  LVOT VTI:          0.248 m  LVOT/AV VTI ratio: 0.49     AORTA  Ao Root diam: 3.70 cm  Ao Asc diam:  4.30 cm   MITRAL VALVE  MV Area (PHT): 2.60 cm     SHUNTS  MV Area VTI:   1.46 cm     Systemic VTI:  0.25 m  MV Peak grad:  15.4 mmHg    Systemic Diam: 2.00 cm  MV Mean grad:  3.5 mmHg  MV Vmax:       1.96 m/s  MV Vmean:      78.8 cm/s  MV Decel Time: 292 msec  MV E velocity: 177.00 cm/s   Antimicrobials:  Anti-infectives (From admission, onward)    Start     Dose/Rate Route Frequency Ordered Stop   06/23/21 1100  doxycycline (VIBRA-TABS) tablet 100 mg        100 mg Oral 2 times daily 06/23/21 1009 06/28/21 0959        Subjective: Seen and examined at bedside and he had no complaints or concerns.  No chest pain or shortness of breath.  Felt okay.  Has right-sided hemiplegia.    Objective: Vitals:   06/25/21 2300 06/26/21 0000 06/26/21 0504 06/26/21 0733  BP: (!) 170/101 (!) 153/101 (!) 153/99 (!) 162/87  Pulse:   62 61  Resp: 18  16 18   Temp: 98.6 F (37 C)  98.9 F (37.2 C) 97.6 F (36.4 C)  TempSrc: Oral  Oral Oral  SpO2: 98%  98% 97%  Weight:      Height:        Intake/Output Summary (Last 24 hours) at 06/26/2021 1221 Last data filed at 06/26/2021 0900 Gross per 24 hour  Intake 220 ml  Output 1150 ml  Net -930 ml   Filed Weights   06/23/21 0415  Weight: 86.2 kg   Examination: Physical Exam:  Constitutional: WN/WD Caucasian ill-appearing male currently in no acute distress appears calm Eyes: Lids and conjunctivae normal, sclerae anicteric  ENMT: External Ears, Nose appear normal. Grossly normal hearing. Neck: Appears normal, supple, no cervical masses, normal ROM, no appreciable thyromegaly; no JVD Respiratory: Diminished to auscultation bilaterally, no wheezing, rales, rhonchi or crackles.  Normal respiratory effort and patient is not tachypenic. No accessory muscle use. Unlabored breathing  Cardiovascular: RRR, no murmurs / rubs / gallops. No carotid bruits.  Abdomen: Soft, non-tender, non-distended. Bowel sounds positive.  GU: Deferred. Musculoskeletal: No clubbing / cyanosis of digits/nails. No joint deformity upper and lower extremities.  Has right-sided hemiplegia Skin: No rashes, lesions, ulcers. No induration; Warm and dry.  Neurologic: CN 2-12 grossly intact with no focal deficits. Romberg sign and cerebellar reflexes not assessed.  Psychiatric: Normal judgment and insight. Alert and oriented x 3. Normal mood and appropriate affect.   Data Reviewed: I have personally reviewed following  labs and imaging studies  CBC: Recent Labs  Lab 06/23/21 0355 06/23/21 0401 06/24/21 0219 06/25/21 0325 06/26/21 0233  WBC 4.1  --  4.5 4.1 4.8  NEUTROABS 2.6  --   --   --   --   HGB 14.1 15.0 12.9* 14.0 15.2  HCT 45.3 44.0 41.0 42.6 47.1  MCV 89.7  --  88.0 86.6 87.1  PLT 124*  --  114* 126* 149*   Basic Metabolic Panel: Recent Labs  Lab 06/22/21 1032 06/23/21 0355 06/23/21 0401 06/24/21 0219 06/25/21 0325 06/26/21 0233  NA 138 137 139 138 138 138  K 4.5 3.8 3.9 3.9 3.6 3.4*  CL 99 102 106 99 102 100  CO2 31 24  --  28 26 28   GLUCOSE 69* 88 86 75 73 84  BUN 18 27* 30* 21 22 24*  CREATININE 1.06 1.04 1.10 1.34* 1.15 1.17  CALCIUM 10.0 10.0  --  9.2 9.4 9.8   GFR: Estimated Creatinine Clearance: 65.4 mL/min (by C-G formula based on SCr of 1.17 mg/dL). Liver Function Tests: Recent Labs  Lab 06/22/21 1032 06/23/21 0355  AST 22 25  ALT 12 13  ALKPHOS 125* 117  BILITOT 1.4* 1.4*  PROT 7.5 7.1  ALBUMIN 4.1 3.6   No results for input(s): LIPASE, AMYLASE in the last 168 hours. No results for input(s): AMMONIA in the last 168 hours. Coagulation Profile: Recent Labs  Lab 06/23/21 0355  INR 1.5*   Cardiac Enzymes: No results for input(s): CKTOTAL, CKMB,  CKMBINDEX, TROPONINI in the last 168 hours. BNP (last 3 results) Recent Labs    08/24/20 1025 11/24/20 0942 06/22/21 1032  PROBNP 370.0* 572.0* 324.0*   HbA1C: No results for input(s): HGBA1C in the last 72 hours. CBG: Recent Labs  Lab 06/23/21 0353  GLUCAP 73   Lipid Profile: Recent Labs    06/24/21 0219  CHOL 143  HDL 37*  LDLCALC 92  TRIG 72  CHOLHDL 3.9   Thyroid Function Tests: No results for input(s): TSH, T4TOTAL, FREET4, T3FREE, THYROIDAB in the last 72 hours. Anemia Panel: No results for input(s): VITAMINB12, FOLATE, FERRITIN, TIBC, IRON, RETICCTPCT in the last 72 hours. Sepsis Labs: No results for input(s): PROCALCITON, LATICACIDVEN in the last 168 hours.  Recent Results (from the past 240 hour(s))  Resp Panel by RT-PCR (Flu A&B, Covid) Nasopharyngeal Swab     Status: None   Collection Time: 06/23/21  4:42 AM   Specimen: Nasopharyngeal Swab; Nasopharyngeal(NP) swabs in vial transport medium  Result Value Ref Range Status   SARS Coronavirus 2 by RT PCR NEGATIVE NEGATIVE Final    Comment: (NOTE) SARS-CoV-2 target nucleic acids are NOT DETECTED.  The SARS-CoV-2 RNA is generally detectable in upper respiratory specimens during the acute phase of infection. The lowest concentration of SARS-CoV-2 viral copies this assay can detect is 138 copies/mL. A negative result does not preclude SARS-Cov-2 infection and should not be used as the sole basis for treatment or other patient management decisions. A negative result may occur with  improper specimen collection/handling, submission of specimen other than nasopharyngeal swab, presence of viral mutation(s) within the areas targeted by this assay, and inadequate number of viral copies(<138 copies/mL). A negative result must be combined with clinical observations, patient history, and epidemiological information. The expected result is Negative.  Fact Sheet for Patients:   BloggerCourse.comhttps://www.fda.gov/media/152166/download  Fact Sheet for Healthcare Providers:  SeriousBroker.ithttps://www.fda.gov/media/152162/download  This test is no t yet approved or cleared by the [place] FDA and  has been authorized for detection and/or diagnosis of SARS-CoV-2 by FDA under an Emergency Use Authorization (EUA). This EUA will remain  in effect (meaning this test can be used) for the duration of the COVID-19 declaration under Section 564(b)(1) of the Act, 21 U.S.C.section 360bbb-3(b)(1), unless the authorization is terminated  or revoked sooner.       Influenza A by PCR NEGATIVE NEGATIVE Final   Influenza B by PCR NEGATIVE NEGATIVE Final    Comment: (NOTE) The Xpert Xpress SARS-CoV-2/FLU/RSV plus assay is intended as an aid in the diagnosis of influenza from Nasopharyngeal swab specimens and should not be used as a sole basis for treatment. Nasal washings and aspirates are unacceptable for Xpert Xpress SARS-CoV-2/FLU/RSV testing.  Fact Sheet for Patients: BloggerCourse.com  Fact Sheet for Healthcare Providers: SeriousBroker.it  This test is not yet approved or cleared by the [place] FDA and has been authorized for detection and/or diagnosis of SARS-CoV-2 by FDA under an Emergency Use Authorization (EUA). This EUA will remain in effect (meaning this test can be used) for the duration of the COVID-19 declaration under Section 564(b)(1) of the Act, 21 U.S.C. section 360bbb-3(b)(1), unless the authorization is terminated or revoked.  Performed at [facility], [address], [place], [place] [address]   MRSA Next Gen by PCR, Nasal     Status: None   Collection Time: 06/23/21  5:33 AM   Specimen: Nasal Mucosa; Nasal Swab  Result Value Ref Range Status   MRSA by PCR Next Gen NOT DETECTED NOT DETECTED Final    Comment: (NOTE) The GeneXpert MRSA Assay (FDA approved for NASAL specimens only), is one component of a  comprehensive MRSA colonization surveillance program. It is not intended to diagnose MRSA infection nor to guide or monitor treatment for MRSA infections. Test performance is not FDA approved in patients less than 63 years old. Performed at [facility], [address], [place], [place] [address]     RN Pressure Injury Documentation: Pressure Injury 05/12/20 Sacrum Stage 1 -  Intact skin with non-blanchable redness of a localized area usually over a bony prominence. (Active)  05/12/20 1700  Location: Sacrum  Location Orientation:   Staging: Stage 1 -  Intact skin with non-blanchable redness of a localized area usually over a bony prominence.  Wound Description (Comments):   Present on Admission: No   Estimated body mass index is 24.39 kg/m as calculated from the following:   Height as of this encounter: 6\' 2"  (1.88 m).   Weight as of this encounter: 86.2 kg.  Malnutrition Type:   Malnutrition Characteristics:   Nutrition Interventions:   Radiology Studies: No results found.  Scheduled Meds:  Chlorhexidine Gluconate Cloth  6 each Topical Daily   doxycycline  100 mg Oral BID   furosemide  80 mg Oral Daily   gabapentin  100 mg Oral QHS   hydrALAZINE  50 mg Oral Q8H   losartan  100 mg Oral Daily   mouth rinse  15 mL Mouth Rinse BID   pantoprazole  40 mg Oral Daily   potassium chloride  40 mEq Oral BID   senna-docusate  1 tablet Oral BID   Continuous Infusions:   LOS: 3 days   , DO [facility] PAGER is on AMION  If 7PM-7AM, please contact night-coverage [email or web address]

## 2021-06-26 NOTE — Plan of Care (Signed)
  Problem: Education: Goal: Knowledge of disease or condition will improve Outcome: Progressing Goal: Knowledge of secondary prevention will improve Outcome: Progressing Goal: Knowledge of patient specific risk factors addressed and post discharge goals established will improve Outcome: Progressing Goal: Individualized Educational Video(s) Outcome: Progressing   Problem: Coping: Goal: Will verbalize positive feelings about self Outcome: Progressing Goal: Will identify appropriate support needs Outcome: Progressing   Problem: Health Behavior/Discharge Planning: Goal: Ability to manage health-related needs will improve Outcome: Progressing   Problem: Self-Care: Goal: Ability to participate in self-care as condition permits will improve Outcome: Progressing Goal: Verbalization of feelings and concerns over difficulty with self-care will improve Outcome: Progressing Goal: Ability to communicate needs accurately will improve Outcome: Progressing   Problem: Nutrition: Goal: Risk of aspiration will decrease Outcome: Progressing Goal: Dietary intake will improve Outcome: Progressing   Problem: Intracerebral Hemorrhage Tissue Perfusion: Goal: Complications of Intracerebral Hemorrhage will be minimized Outcome: Progressing   Problem: Education: Goal: Knowledge of General Education information will improve Description: Including pain rating scale, medication(s)/side effects and non-pharmacologic comfort measures Outcome: Progressing   Problem: Health Behavior/Discharge Planning: Goal: Ability to manage health-related needs will improve Outcome: Progressing   Problem: Clinical Measurements: Goal: Ability to maintain clinical measurements within normal limits will improve Outcome: Progressing Goal: Will remain free from infection Outcome: Progressing Goal: Diagnostic test results will improve Outcome: Progressing Goal: Respiratory complications will improve Outcome:  Progressing Goal: Cardiovascular complication will be avoided Outcome: Progressing   Problem: Activity: Goal: Risk for activity intolerance will decrease Outcome: Progressing   Problem: Nutrition: Goal: Adequate nutrition will be maintained Outcome: Progressing   Problem: Coping: Goal: Level of anxiety will decrease Outcome: Progressing   Problem: Elimination: Goal: Will not experience complications related to bowel motility Outcome: Progressing Goal: Will not experience complications related to urinary retention Outcome: Progressing   Problem: Pain Managment: Goal: General experience of comfort will improve Outcome: Progressing   Problem: Safety: Goal: Ability to remain free from injury will improve Outcome: Progressing   Problem: Skin Integrity: Goal: Risk for impaired skin integrity will decrease Outcome: Progressing   

## 2021-06-27 ENCOUNTER — Encounter (HOSPITAL_COMMUNITY): Payer: Self-pay | Admitting: Physical Medicine & Rehabilitation

## 2021-06-27 ENCOUNTER — Inpatient Hospital Stay (HOSPITAL_COMMUNITY)
Admission: RE | Admit: 2021-06-27 | Discharge: 2021-07-15 | DRG: 057 | Disposition: A | Discharge status: Skilled Nursing Facility | Payer: Medicare Other | Source: Intra-hospital | Attending: Physical Medicine & Rehabilitation | Admitting: Physical Medicine & Rehabilitation

## 2021-06-27 ENCOUNTER — Other Ambulatory Visit: Payer: Self-pay

## 2021-06-27 DIAGNOSIS — I482 Chronic atrial fibrillation, unspecified: Secondary | ICD-10-CM | POA: Diagnosis present

## 2021-06-27 DIAGNOSIS — R339 Retention of urine, unspecified: Secondary | ICD-10-CM | POA: Diagnosis present

## 2021-06-27 DIAGNOSIS — R1310 Dysphagia, unspecified: Secondary | ICD-10-CM | POA: Diagnosis present

## 2021-06-27 DIAGNOSIS — D696 Thrombocytopenia, unspecified: Secondary | ICD-10-CM | POA: Diagnosis not present

## 2021-06-27 DIAGNOSIS — Z87891 Personal history of nicotine dependence: Secondary | ICD-10-CM | POA: Diagnosis not present

## 2021-06-27 DIAGNOSIS — Z951 Presence of aortocoronary bypass graft: Secondary | ICD-10-CM | POA: Diagnosis not present

## 2021-06-27 DIAGNOSIS — Z20822 Contact with and (suspected) exposure to covid-19: Secondary | ICD-10-CM | POA: Diagnosis present

## 2021-06-27 DIAGNOSIS — L03115 Cellulitis of right lower limb: Secondary | ICD-10-CM | POA: Diagnosis present

## 2021-06-27 DIAGNOSIS — I2510 Atherosclerotic heart disease of native coronary artery without angina pectoris: Secondary | ICD-10-CM | POA: Diagnosis present

## 2021-06-27 DIAGNOSIS — I619 Nontraumatic intracerebral hemorrhage, unspecified: Secondary | ICD-10-CM | POA: Diagnosis present

## 2021-06-27 DIAGNOSIS — D6959 Other secondary thrombocytopenia: Secondary | ICD-10-CM | POA: Diagnosis present

## 2021-06-27 DIAGNOSIS — E785 Hyperlipidemia, unspecified: Secondary | ICD-10-CM | POA: Diagnosis present

## 2021-06-27 DIAGNOSIS — I69151 Hemiplegia and hemiparesis following nontraumatic intracerebral hemorrhage affecting right dominant side: Secondary | ICD-10-CM | POA: Diagnosis not present

## 2021-06-27 DIAGNOSIS — L89151 Pressure ulcer of sacral region, stage 1: Secondary | ICD-10-CM | POA: Diagnosis present

## 2021-06-27 DIAGNOSIS — Z9119 Patient's noncompliance with other medical treatment and regimen: Secondary | ICD-10-CM

## 2021-06-27 DIAGNOSIS — K5909 Other constipation: Secondary | ICD-10-CM | POA: Diagnosis present

## 2021-06-27 DIAGNOSIS — L03116 Cellulitis of left lower limb: Secondary | ICD-10-CM | POA: Diagnosis present

## 2021-06-27 DIAGNOSIS — Z79899 Other long term (current) drug therapy: Secondary | ICD-10-CM | POA: Diagnosis not present

## 2021-06-27 DIAGNOSIS — I69391 Dysphagia following cerebral infarction: Secondary | ICD-10-CM | POA: Diagnosis not present

## 2021-06-27 DIAGNOSIS — I69191 Dysphagia following nontraumatic intracerebral hemorrhage: Secondary | ICD-10-CM

## 2021-06-27 DIAGNOSIS — R3915 Urgency of urination: Secondary | ICD-10-CM | POA: Diagnosis present

## 2021-06-27 DIAGNOSIS — M7501 Adhesive capsulitis of right shoulder: Secondary | ICD-10-CM | POA: Diagnosis present

## 2021-06-27 DIAGNOSIS — I5042 Chronic combined systolic (congestive) and diastolic (congestive) heart failure: Secondary | ICD-10-CM | POA: Diagnosis present

## 2021-06-27 DIAGNOSIS — I610 Nontraumatic intracerebral hemorrhage in hemisphere, subcortical: Secondary | ICD-10-CM | POA: Diagnosis not present

## 2021-06-27 DIAGNOSIS — R531 Weakness: Secondary | ICD-10-CM | POA: Diagnosis present

## 2021-06-27 DIAGNOSIS — Z953 Presence of xenogenic heart valve: Secondary | ICD-10-CM | POA: Diagnosis not present

## 2021-06-27 DIAGNOSIS — I110 Hypertensive heart disease with heart failure: Secondary | ICD-10-CM | POA: Diagnosis present

## 2021-06-27 DIAGNOSIS — Z7901 Long term (current) use of anticoagulants: Secondary | ICD-10-CM | POA: Diagnosis not present

## 2021-06-27 DIAGNOSIS — N400 Enlarged prostate without lower urinary tract symptoms: Secondary | ICD-10-CM | POA: Diagnosis present

## 2021-06-27 DIAGNOSIS — I10 Essential (primary) hypertension: Secondary | ICD-10-CM | POA: Diagnosis not present

## 2021-06-27 LAB — CBC WITH DIFFERENTIAL/PLATELET
Abs Immature Granulocytes: 0.02 10*3/uL (ref 0.00–0.07)
Basophils Absolute: 0.1 10*3/uL (ref 0.0–0.1)
Basophils Relative: 1 %
Eosinophils Absolute: 0.2 10*3/uL (ref 0.0–0.5)
Eosinophils Relative: 3 %
HCT: 48.3 % (ref 39.0–52.0)
Hemoglobin: 15.6 g/dL (ref 13.0–17.0)
Immature Granulocytes: 0 %
Lymphocytes Relative: 16 %
Lymphs Abs: 0.9 10*3/uL (ref 0.7–4.0)
MCH: 28.2 pg (ref 26.0–34.0)
MCHC: 32.3 g/dL (ref 30.0–36.0)
MCV: 87.2 fL (ref 80.0–100.0)
Monocytes Absolute: 0.9 10*3/uL (ref 0.1–1.0)
Monocytes Relative: 16 %
Neutro Abs: 3.5 10*3/uL (ref 1.7–7.7)
Neutrophils Relative %: 64 %
Platelets: 156 10*3/uL (ref 150–400)
RBC: 5.54 MIL/uL (ref 4.22–5.81)
RDW: 18.7 % — ABNORMAL HIGH (ref 11.5–15.5)
WBC: 5.5 10*3/uL (ref 4.0–10.5)
nRBC: 0 % (ref 0.0–0.2)

## 2021-06-27 LAB — COMPREHENSIVE METABOLIC PANEL
ALT: 16 U/L (ref 0–44)
AST: 28 U/L (ref 15–41)
Albumin: 3.6 g/dL (ref 3.5–5.0)
Alkaline Phosphatase: 98 U/L (ref 38–126)
Anion gap: 11 (ref 5–15)
BUN: 26 mg/dL — ABNORMAL HIGH (ref 8–23)
CO2: 26 mmol/L (ref 22–32)
Calcium: 9.7 mg/dL (ref 8.9–10.3)
Chloride: 102 mmol/L (ref 98–111)
Creatinine, Ser: 1.19 mg/dL (ref 0.61–1.24)
GFR, Estimated: >60 mL/min (ref >60)
Glucose, Bld: 79 mg/dL (ref 70–99)
Potassium: 4.1 mmol/L (ref 3.5–5.1)
Sodium: 139 mmol/L (ref 135–145)
Total Bilirubin: 2 mg/dL — ABNORMAL HIGH (ref 0.3–1.2)
Total Protein: 7.5 g/dL (ref 6.5–8.1)

## 2021-06-27 LAB — MAGNESIUM: Magnesium: 1.9 mg/dL (ref 1.7–2.4)

## 2021-06-27 LAB — PHOSPHORUS: Phosphorus: 3.5 mg/dL (ref 2.5–4.6)

## 2021-06-27 MED ORDER — DOXYCYCLINE HYCLATE 100 MG PO TABS
100.0000 mg | ORAL_TABLET | Freq: Two times a day (BID) | ORAL | Status: AC
  Administered 2021-06-27: 100 mg via ORAL
  Filled 2021-06-27: qty 1

## 2021-06-27 MED ORDER — FUROSEMIDE 40 MG PO TABS
80.0000 mg | ORAL_TABLET | Freq: Every day | ORAL | Status: DC
  Administered 2021-06-28 – 2021-06-29 (×2): 80 mg via ORAL
  Filled 2021-06-27 (×2): qty 2

## 2021-06-27 MED ORDER — HYDROCODONE-ACETAMINOPHEN 5-325 MG PO TABS
1.0000 | ORAL_TABLET | Freq: Four times a day (QID) | ORAL | Status: DC | PRN
  Administered 2021-06-27 – 2021-06-29 (×4): 1 via ORAL
  Filled 2021-06-27 (×4): qty 1

## 2021-06-27 MED ORDER — ACETAMINOPHEN 325 MG PO TABS: 650.0000 mg | ORAL_TABLET | ORAL | Status: AC | PRN

## 2021-06-27 MED ORDER — CARVEDILOL 3.125 MG PO TABS: 3.1250 mg | ORAL_TABLET | Freq: Two times a day (BID) | ORAL | Status: DC

## 2021-06-27 MED ORDER — PANTOPRAZOLE SODIUM 40 MG PO TBEC: 40.0000 mg | DELAYED_RELEASE_TABLET | Freq: Every day | ORAL | 0 refills | Status: AC

## 2021-06-27 MED ORDER — ACETAMINOPHEN 160 MG/5ML PO SOLN
650.0000 mg | ORAL | Status: DC | PRN
  Administered 2021-07-01: 650 mg
  Filled 2021-06-27: qty 20.3

## 2021-06-27 MED ORDER — LOSARTAN POTASSIUM 50 MG PO TABS
100.0000 mg | ORAL_TABLET | Freq: Every day | ORAL | Status: DC
  Administered 2021-06-28 – 2021-06-30 (×3): 100 mg via ORAL
  Filled 2021-06-27 (×3): qty 2

## 2021-06-27 MED ORDER — GABAPENTIN 100 MG PO CAPS
100.0000 mg | ORAL_CAPSULE | Freq: Every day | ORAL | Status: DC
  Administered 2021-06-27 – 2021-06-28 (×2): 100 mg via ORAL
  Filled 2021-06-27 (×2): qty 1

## 2021-06-27 MED ORDER — ACETAMINOPHEN 650 MG RE SUPP: 650.0000 mg | RECTAL | Status: DC | PRN

## 2021-06-27 MED ORDER — PANTOPRAZOLE SODIUM 40 MG PO TBEC
40.0000 mg | DELAYED_RELEASE_TABLET | Freq: Every day | ORAL | Status: DC
  Administered 2021-06-28 – 2021-07-15 (×18): 40 mg via ORAL
  Filled 2021-06-27 (×18): qty 1

## 2021-06-27 MED ORDER — SENNOSIDES-DOCUSATE SODIUM 8.6-50 MG PO TABS: 1.0000 | ORAL_TABLET | Freq: Two times a day (BID) | ORAL | Status: DC

## 2021-06-27 MED ORDER — HYDRALAZINE HCL 50 MG PO TABS
50.0000 mg | ORAL_TABLET | Freq: Three times a day (TID) | ORAL | Status: DC
  Administered 2021-06-27 – 2021-07-01 (×10): 50 mg via ORAL
  Filled 2021-06-27 (×10): qty 1

## 2021-06-27 MED ORDER — ACETAMINOPHEN 325 MG PO TABS
650.0000 mg | ORAL_TABLET | ORAL | Status: DC | PRN
  Administered 2021-07-04 – 2021-07-11 (×2): 650 mg via ORAL
  Filled 2021-06-27 (×2): qty 2

## 2021-06-27 NOTE — Progress Notes (Signed)
Signed                                                                                                                                                                                                                                                                                                                                                                                                                                                                                            PMR Admission Coordinator Pre-Admission Assessment   Patient: Carlos Welch is an 75 y.o., male MRN: [id number] DOB: [birth year] Height: _0  (188 cm) Weight: 86.2 kg   Insurance Information HMO:     PPO:      PCP:      IPA:      80/20: yes     OTHER:  PRIMARY: Medicare A & B      Policy#: [id number]      Subscriber: patient CM Name:    NA   Phone#: NA     Fax#: NA Pre-Cert#:   NA    Employer: NA Benefits:  Phone #: verified eligibility online via [employer] on 06/25/21     Name: NA  Eff. Date: Part A effective 07/16/12, Part B effective 10/17/19     Deduct: $1,556      Out of Pocket Max: NA      Life Max: NA CIR: 100%      SNF: 100% days 1-20, 80% days 21-100 Outpatient: 80%     Co-Pay: 20% Home Health: 100%      Co-Pay:  DME: 80%     Co-Pay: 20% Providers: pt's choice SECONDARY:    NA   Policy#:     Phone#:    Financial Counselor:     NA  Phone#:    The Therapist, art Information Summary" for patients in Inpatient Rehabilitation Facilities with attached "Privacy Act [place] Records" was provided and verbally reviewed with: Patient and Family   Emergency Contact Information Contact Information       Name Relation Home Work Mobile    Hearn,Heather Daughter     [phone]           Current Medical History  Patient Admitting Diagnosis:  intracerebral hemorrhage History of Present Illness: Pt is a 75 year old male with medical hx significant for: HTN, HLD, A-fib, aortic stenosis s/p valve replacement, TIA, BPH, CAD s/p CABG, thrombocytopenia, bilateral lower extremity cellulitis, combined CHF.  Pt presented to ED via EMS on 9/8.  Code stroke activated by EMS. Pt developed abrupt right sided weakness and numbness. Also had slurred speech. CT head showed left thalamic bleed.  CTA was negative for LVO, CTA spot sign, or intracranial aneursym. Echo revealed EF of 60-65%. MRI on 9/9 showed stable hematoma, no underlying mass lesion.  Therapy evaluations completed and CIR recommended d/t pt's functional mobility deficits, cognitive deficits, and inability to perform ADLs independently.   Complete NIHSS TOTAL: 5   Patient's medical record from [facility] has been reviewed by the rehabilitation admission coordinator and physician.   Past Medical History      Past Medical History:  Diagnosis Date   A-fib ([place])     Abscess of jaw 07/19/2020   Acute on chronic heart failure with preserved ejection fraction ([place]) 07/08/2020   Aortic stenosis      a. s/p pericardial AVR 2015.   Aortic valve stenosis 07/19/2020   Bacteremia 07/19/2020   Bacterial endocarditis     BPH (benign prostatic hyperplasia) 07/08/2020   CAD (coronary artery disease)      a. s/p CABG, AVR, LAA clipping 2015 at [facility].   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/08/2020   Elevated troponin 07/08/2020   Essential hypertension 07/08/2020   Generalized abdominal pain     History of noncompliance  with medical treatment     Hyperlipemia 07/08/2020   Infection associated with implant ([place]) 07/19/2020   Malnutrition ([place])     MRSA bacteremia     Nonrheumatic mitral valve regurgitation 07/19/2020   Normocytic anemia 07/08/2020   Pressure injury of skin 05/12/2020   S/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 No   Family History   family history is not on file.   Current Medications   Current Facility-Administered Medications:    acetaminophen (TYLENOL) tablet 650 mg, 650 mg, Oral, Q4H PRN, 650 mg at 06/23/21 2004 **OR** acetaminophen (TYLENOL) 160 MG/5ML solution 650 mg, 650 mg, Per Tube, Q4H PRN **OR** acetaminophen (TYLENOL) suppository 650 mg, 650 mg, Rectal, Q4H PRN, Amie Portland, MD   Chlorhexidine Gluconate Cloth 2 % PADS 6 each, 6 each, Topical, Daily, Rosalin Hawking, MD, 6 each at 06/27/21 1018   doxycycline (VIBRA-TABS) tablet 100 mg, 100 mg, Oral, BID, Rosalin Hawking, MD, 100 mg at 06/27/21 1017   furosemide (LASIX) tablet 80 mg, 80 mg, Oral, Daily, Rosalin Hawking, MD, 80 mg at 06/27/21 1016   gabapentin (NEURONTIN) capsule 100 mg, 100 mg, Oral, QHS, Rosalin Hawking, MD, 100 mg at 06/26/21 2215   hydrALAZINE (APRESOLINE) injection 5-10 mg, 5-10 mg, Intravenous, Q4H PRN, Rosalin Hawking, MD, 10 mg at 06/27/21 0405   hydrALAZINE (APRESOLINE) tablet 50 mg, 50 mg, Oral, Q8H, Rosalin Hawking, MD, 50 mg at 06/27/21 0555   HYDROcodone-acetaminophen (NORCO/VICODIN) 5-325 MG per tablet 1 tablet, 1 tablet, Oral, Q6H PRN, Amie Portland, MD, 1 tablet at 06/26/21 0139   losartan (COZAAR) tablet 100 mg, 100 mg, Oral, Daily, Rosalin Hawking, MD, 100 mg at 06/27/21 1016   MEDLINE mouth rinse, 15 mL, Mouth Rinse, BID, Rosalin Hawking, MD, 15 mL at 06/27/21 1018   ondansetron (ZOFRAN) injection 4 mg,  4 mg, Intravenous, Q6H PRN, Amie Portland, MD, 4 mg at 06/23/21 0431   pantoprazole (PROTONIX) EC tablet 40 mg, 40 mg, Oral, Daily, Rosalin Hawking, MD, 40 mg at 06/27/21 1017   senna-docusate (Senokot-S) tablet 1 tablet, 1 tablet, Oral, BID, Amie Portland, MD, 1 tablet at 06/27/21 1016   Patients Current Diet:  Diet Order                  DIET DYS 2 Room service appropriate? No; Fluid consistency: Nectar Thick  Diet effective now                         Precautions / Restrictions Precautions Precautions: Fall, Other (comment) Precaution Comments: R inattention, decr R sensation Restrictions Weight Bearing Restrictions: No    Has the patient had 2 or more falls or a fall with injury in the past year? No   Prior Activity Level Limited Community (1-2x/wk): out of house 1 day/week   Prior Functional Level Self Care: Did the patient need help bathing, dressing, using the toilet or eating? Independent   Indoor Mobility: Did the patient need assistance with walking from room to room (with or without device)? Independent   Stairs: Did the patient need assistance with internal or external stairs (with or without device)? Pt did not utilize steps   Functional Cognition: Did the patient need help planning regular tasks such as shopping or remembering to take medications? Dependent   Patient Information Are you of Hispanic, Latino/a,or Spanish origin?: X. Patient unable to respond, A. No, not of Hispanic, Latino/a, or Spanish origin What is your race?: X. Patient unable to respond, A. White Do you need or want an interpreter to communicate with a doctor or health care staff?: 0. No   Patient's Response To:  Health Literacy and Transportation Is the patient able to respond to health literacy and transportation needs?: No (daughter responded that pt rarely needs assistance  with medical reading.  Also responded that transportation has not kept him from medical appointments or non-medical  appointments)   [place] / Equipment Home Equipment: Grab bars - tub/shower, Hand held shower head, Cane - single point, Environmental consultant - 2 wheels   Prior Device Use: Indicate devices/aids used by the patient prior to current illness, exacerbation or injury?  cane   Current Functional Level Cognition   Arousal/Alertness: Awake/alert Overall Cognitive Status: Impaired/Different from baseline Current Attention Level: Selective Orientation Level: Oriented to person Following Commands: Follows one step commands consistently, Follows one step commands with increased time Safety/Judgement: Decreased awareness of deficits, Decreased awareness of safety General Comments: pt unable to state location noted to be impulsive with mobility, follows commands with increased time. poor awareness into deficits noted likely d/t R inattention, unable to determine R vs L side Awareness: Impaired Awareness Impairment: Intellectual impairment, Emergent impairment Problem Solving: Impaired Problem Solving Impairment: Functional basic Executive Function: Self Monitoring, Self Correcting Self Monitoring: Impaired (Impaired for functional, intact for verbal) Self Monitoring Impairment: Functional basic Self Correcting: Impaired (Impaired for functional, intact for verbal) Self Correcting Impairment: Functional basic Safety/Judgment: Other (comment) (suspect impaired)    Extremity Assessment (includes Sensation/Coordination)   Upper Extremity Assessment: Defer to OT evaluation  Lower Extremity Assessment: RLE deficits/detail, LLE deficits/detail RLE Deficits / Details: AROM WFL, strength hip flexion 3-/5, knee extension 3+/5, ankle DF 3/5, limited due to poor awareness/proprioception RLE Sensation: decreased proprioception, decreased light touch RLE Coordination: decreased gross motor LLE Deficits / Details: AROM WFL, strength at least 3+ to 4/5     ADLs   Overall ADL's : Needs  assistance/impaired Eating/Feeding: Maximal assistance, Bed level Eating/Feeding Details (indicate cue type and reason): pt verbalized being hungry but does not initiate self feeding. pt needs cues to clear R side of mouth throughout session. pt with dentures Grooming: Wash/dry face, Sitting, Supervision/safety, Set up Grooming Details (indicate cue type and reason): sitting in recliner at sink Upper Body Bathing: Moderate assistance, Sitting Upper Body Bathing Details (indicate cue type and reason): simulated via applying lotion to RUE Lower Body Bathing: Total assistance Upper Body Dressing : Maximal assistance Lower Body Dressing: Total assistance Toilet Transfer: +2 for physical assistance, Maximal assistance Toilet Transfer Details (indicate cue type and reason): simulated via stand pivot transfer from EOB >recliner Functional mobility during ADLs: Maximal assistance, +2 for physical assistance General ADL Comments: pt continues to present with impaired balance, impaired coordination in RUE/RLE, and impaired cognition     Mobility   Overal bed mobility: Needs Assistance Bed Mobility: Supine to Sit Supine to sit: Mod assist, HOB elevated General bed mobility comments: increased time, cues for sequencing, assist with RLE and to elevate trunk     Transfers   Overall transfer level: Needs assistance Equipment used: Rolling walker (2 wheeled), 2 person hand held assist Transfers: Sit to/from Stand, Stand Pivot Transfers Sit to Stand: +2 physical assistance, +2 safety/equipment, Max assist Stand pivot transfers: +2 physical assistance, Max assist General transfer comment: assist to power up and stabilize balance. Sit to stand with RW with assist to maintain grip R hand. SPT bed to recliner without AD. Pt unable to take pivot steps.     Ambulation / Gait / Stairs / Wheelchair Mobility   Ambulation/Gait Ambulation/Gait assistance: +2 physical assistance, Max assist Gait Distance (Feet):  1 Feet Assistive device: Rolling walker (2 wheeled) Gait Pattern/deviations: Decreased dorsiflexion - right, Ataxic, Staggering right General Gait Details: Step forward with RLE, BUE supported  on RW.  Significant LOB/lean toward R with inability to correct/return to midline. Required return to sitting.     Posture / Balance Dynamic Sitting Balance Sitting balance - Comments: R lateral lean and needs cues to self correct Balance Overall balance assessment: Needs assistance Sitting-balance support: Single extremity supported, Feet supported Sitting balance-Leahy Scale: Fair Sitting balance - Comments: R lateral lean and needs cues to self correct Postural control: Right lateral lean, Posterior lean Standing balance support: Bilateral upper extremity supported, During functional activity Standing balance-Leahy Scale: Zero Standing balance comment: attempting dynamic reaching at sink with R lateral lean worsening needind MAX A +2 to maintain midline positioning     Special needs/care consideration Skin Cellulitis-leg/bilateral; Ecchymosis-leg/bilateral, Bladder incontinence, External urinary catheter and Designated visitor Blanchie Serve, daughter    Previous Home Environment (from acute therapy documentation) Living Arrangements: Children, Other (Comment) (grandson)  Lives With: Daughter, Other (Comment) (grandson) Available Help at Discharge: Family, Available 24 hours/day Type of Home: House Home Layout: One level Home Access: Level entry Bathroom Shower/Tub: Tub/shower unit, Health visitor: Handicapped height Bathroom Accessibility: Yes How Accessible: Accessible via walker Home Care Services: No Additional Comments: information from prior admission and from patient report   Discharge Living Setting Plans for Discharge Living Setting: Lives with (comment) (lives with daughter and grandson) Type of Home at Discharge: House Discharge Home Layout: One level Discharge  Home Access: Level entry Discharge Bathroom Shower/Tub: Tub/shower unit, Walk-in shower Discharge Bathroom Toilet: Handicapped height Discharge Bathroom Accessibility: Yes How Accessible: Accessible via walker Does the patient have any problems obtaining your medications?: No   Social/Family/Support Systems Anticipated Caregiver: Blanchie Serve, daughter and grandson Anticipated Caregiver's Contact Information: [phone] Caregiver Availability: 24/7 Discharge Plan Discussed with Primary Caregiver: Yes Is Caregiver In Agreement with Plan?: Yes Does Caregiver/Family have Issues with Lodging/Transportation while Pt is in Rehab?: No   Goals Patient/Family Goal for Rehab: Supervision-Min A: PT/OT Expected length of stay: 14-16 days Pt/Family Agrees to Admission and willing to participate: Yes Program Orientation Provided & Reviewed with Pt/Caregiver Including Roles  & Responsibilities: Yes   Decrease burden of Care through IP rehab admission: NA   Possible need for SNF placement upon discharge: Not anticipated   Patient Condition: I have reviewed medical records from [facility], spoken with CM, and patient and daughter. I met with patient at the bedside for inpatient rehabilitation assessment.  Patient will benefit from ongoing PT, OT, and SLP, can actively participate in 3 hours of therapy a day 5 days of the week, and can make measurable gains during the admission.  Patient will also benefit from the coordinated team approach during an Inpatient Acute Rehabilitation admission.  The patient will receive intensive therapy as well as Rehabilitation physician, nursing, social worker, and care management interventions.  Due to bladder management, safety, skin/wound care, disease management, medication administration, pain management, and patient education the patient requires 24 hour a day rehabilitation nursing.  The patient is currently Mod-Max +2 with mobility and Mod A with basic  ADLs.  Discharge setting and therapy post discharge at home with home health is anticipated.  Patient has agreed to participate in the Acute Inpatient Rehabilitation Program and will admit today.   Preadmission Screen Completed By:  Domingo Pulse, 06/27/2021 11:04 AM ______________________________________________________________________   Discussed status with Dr. Milinda Cave on 06/27/21  at 11:04 AM and received approval for admission today.   Admission Coordinator:  Domingo Pulse, CCC-SLP, time 11:04 AM/Date 06/27/21  Assessment/Plan: Diagnosis: Intracerebral hemorrhage Does the need for close, 24 hr/day Medical supervision in concert with the patient's rehab needs make it unreasonable for this patient to be served in a less intensive setting? Yes Co-Morbidities requiring supervision/potential complications: generalized abdominal pain, pressure injury of skin, abscess of jaw, acute on chronic heart failure with preserved ejection fracture, aortic valve stenosis Due to bladder management, bowel management, safety, skin/wound care, disease management, medication administration, pain management, and patient education, does the patient require 24 hr/day rehab nursing? Yes Does the patient require coordinated care of a physician, rehab nurse, PT, OT, and SLP to address physical and functional deficits in the context of the above medical diagnosis(es)? Yes Addressing deficits in the following areas: balance, endurance, locomotion, strength, transferring, bowel/bladder control, bathing, dressing, feeding, grooming, toileting, swallowing, and psychosocial support Can the patient actively participate in an intensive therapy program of at least 3 hrs of therapy 5 days a week? Yes The potential for patient to make measurable gains while on inpatient rehab is excellent Anticipated functional outcomes upon discharge from inpatient rehab: min assist PT, min assist OT, min assist SLP Estimated  rehab length of stay to reach the above functional goals is: 2-3 weeks Anticipated discharge destination: Home 10. Overall Rehab/Functional Prognosis: excellent     MD Signature: Leeroy Cha, MD

## 2021-06-27 NOTE — H&P (Signed)
Physical Medicine and Rehabilitation Admission H&P    Chief Complaint  Patient presents with   Code Stroke  : HPI: Carlos Welch is a 75 year old right-handed male with history of atrial fibrillation maintained on Eliquis, aortic stenosis status post aortic valve replacement, CAD status post CABG, bilateral lower extremity cellulitis, combined CHF, hypertension, hyperlipidemia, thrombocytopenia, remote tobacco abuse.  Per chart review patient lives with his daughter and family.  1 level home.  Walks with a cane.  Reports dressing and bathing himself prior to admission independently.  He has good family support.  Presented 06/23/2021 with acute onset of right-sided weakness and slurred speech.  Cranial CT scan showed acute hemorrhage centered in the left thalamus with an estimated volume of 9 mL.  No intraventricular or extra axial extension of blood.  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  His chronic Eliquis was discontinued due to hematoma.  CT angiogram head and neck negative for LVO or intracranial hemorrhage.  Positive for bilateral cervical ICA pseudoaneurysms, 13 x 20 mm on the right and 9 x 9 mm on the left.  Patient did not receive tPA due to hematoma.  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  Multi foci of susceptibility artifact scattered throughout the supratentorial infratentorial compartment suggesting multiple prior micro bleeds raising suspicion for amyloid angiopathy.  Admission chemistries unremarkable except BUN 27.  Echocardiogram with ejection fraction of 60 to 65% no wall motion abnormalities.  Neurology follow-up close monitoring of blood pressure.  Maintain on a dysphagia #2 nectar thick liquid diet.  Therapy evaluations completed due to patient decreased functional mobility was admitted for a comprehensive rehab  program. Currently very sleepy.   Review of Systems  Constitutional:  Negative for chills and fever.  HENT:  Negative for hearing loss.   Eyes:  Negative for blurred vision and double vision.  Respiratory:  Negative for cough and shortness of breath.   Cardiovascular:  Positive for palpitations and leg swelling. Negative for chest pain.  Gastrointestinal:  Positive for constipation. Negative for heartburn, nausea and vomiting.  Genitourinary:  Positive for urgency. Negative for dysuria, flank pain and hematuria.  Musculoskeletal:  Positive for joint pain.  Skin:  Negative for rash.  Neurological:  Positive for speech change and weakness.  All other systems reviewed and are negative. Past Medical History:  Diagnosis Date   A-fib (HCC)    Abscess of jaw 07/19/2020   Acute on chronic heart failure with preserved ejection fraction (HCC) 07/08/2020   Aortic stenosis    a. s/p pericardial AVR 2015.   Aortic valve stenosis 07/19/2020   Bacteremia 07/19/2020   Bacterial endocarditis    BPH (benign prostatic hyperplasia) 07/08/2020   CAD (coronary artery disease)    a. s/p CABG, AVR, LAA clipping 2015 at [facility].   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/08/2020   Elevated troponin 07/08/2020   Essential hypertension 07/08/2020   Generalized abdominal pain    History of noncompliance with medical treatment    Hyperlipemia 07/08/2020   Infection  associated with implant (HCC) 07/19/2020   Malnutrition (HCC)    MRSA bacteremia    Nonrheumatic mitral valve regurgitation 07/19/2020   Normocytic anemia 07/08/2020   Pressure injury of  skin 05/12/2020   S/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 2015   Severe malnutrition (HCC) 07/19/2020   Thrombocytopenia (HCC) 07/08/2020   TIA (transient ischemic attack)    Weight loss 07/19/2020   Past Surgical History:  Procedure Laterality Date   AORTIC VALVE REPLACEMENT     CORONARY ARTERY BYPASS GRAFT  2015   HERNIA REPAIR     REMOVAL OF IMPLANT  03/14/2020   TEE WITHOUT CARDIOVERSION N/A 05/13/2020   Procedure: TRANSESOPHAGEAL ECHOCARDIOGRAM (TEE);  Surgeon: Lewayne Buntingrenshaw, Brian S, MD;  Location: [facility] END[facility];  Service: Cardiovascular;  Laterality: N/A;   Family History  Problem Relation Age of Onset   CAD Neg Hx    Social History:  reports that he quit smoking about 10 years ago. His smoking use included cigarettes and cigars. He started smoking about 58 years ago. He has a 141.00 pack-year smoking history. He has never used smokeless tobacco. He reports that he does not drink alcohol and does not use drugs. Allergies:  Allergies  Allergen Reactions   Chocolate Rash    9.9.2022 Daughter reports that the patient regularly eats chocolate without any issues.   Peanut-Containing Drug Products Rash   Medications Prior to Admission  Medication Sig Dispense Refill   apixaban (ELIQUIS) 5 MG TABS tablet Take 1 tablet (5 mg total) by mouth in the morning and at bedtime. 60 tablet 6   carvedilol (COREG) 6.25 MG tablet TAKE 1 TABLET BY MOUTH 2 TIMES DAILY WITH A MEAL. (Patient taking differently: Take 6.25 mg by mouth 2 (two) times daily with a meal.) 180 tablet 2   co-enzyme Q-10 30 MG capsule Take 30 mg by mouth 3 (three) times daily.     doxycycline (VIBRA-TABS) 100 MG tablet Take 1 tablet (100 mg total) by mouth 2 (two) times daily. 20 tablet 0   furosemide (LASIX) 40 MG tablet TAKE 2 TABLETS BY MOUTH EVERY DAY (Patient taking differently: Take 80 mg by mouth daily.) 60 tablet 3   gabapentin (NEURONTIN) 100 MG capsule Take 1 capsule (100 mg total) by mouth at bedtime. 30  capsule 0   Ginger, Zingiber officinalis, (GINGER ROOT PO) Take 4 capsules by mouth daily.     hydrALAZINE (APRESOLINE) 50 MG tablet TAKE 1 TABLET BY MOUTH EVERY 8 HOURS. (Patient taking differently: Take 50 mg by mouth in the morning and at bedtime.) 90 tablet 3   ibuprofen (ADVIL) 200 MG tablet Take 600 mg by mouth at bedtime.     losartan (COZAAR) 100 MG tablet TAKE 1 TABLET BY MOUTH ONCE DAILY 90 tablet 3   Omega-3 Fatty Acids (OMEGA 3 PO) Take 1 capsule by mouth daily.     ondansetron (ZOFRAN ODT) 4 MG disintegrating tablet Take 1 tablet (4 mg total) by mouth every 8 (eight) hours as needed for nausea or vomiting. 20 tablet 0   Probiotic Product (PROBIOTIC ADVANCED PO) Take 1 capsule by mouth daily.     ammonium lactate (LAC-HYDRIN) 12 % lotion Apply 1 application topically. (Patient not taking: Reported on 06/24/2021)      Drug Regimen Review Drug regimen was reviewed and remains appropriate with no significant issues identified  Home: Home Living Family/patient expects to be discharged to:: Private residence Living Arrangements: Children, Other (Comment) (grandson) Available Help at Discharge: Family, Available 24 hours/day  Type of Home: House Home Access: Level entry Home Layout: One level Bathroom Shower/Tub: Tub/shower unit, Health visitor: Handicapped height Bathroom Accessibility: Yes Home Equipment: Grab bars - tub/shower, Hand held shower head, Cane - single point, Environmental consultant - 2 wheels Additional Comments: information from prior admission and from patient report  Lives With: Daughter, Other (Comment) (grandson)   Functional History: Prior Function Level of Independence: Needs assistance Gait / Transfers Assistance Needed: walks with cane ADL's / Homemaking Assistance Needed: reports dressing and bathing himself prior to this admission Communication / Swallowing Assistance Needed: SLP recommendations on wall  Functional Status:  Mobility: Bed  Mobility Overal bed mobility: Needs Assistance Bed Mobility: Supine to Sit Supine to sit: Mod assist, HOB elevated General bed mobility comments: increased time, cues for sequencing, assist with RLE and to elevate trunk Transfers Overall transfer level: Needs assistance Equipment used: Rolling walker (2 wheeled), 2 person hand held assist Transfers: Sit to/from Stand, Stand Pivot Transfers Sit to Stand: +2 physical assistance, +2 safety/equipment, Max assist Stand pivot transfers: +2 physical assistance, Max assist General transfer comment: assist to power up and stabilize balance. Sit to stand with RW with assist to maintain grip R hand. SPT bed to recliner without AD. Pt unable to take pivot steps. Ambulation/Gait Ambulation/Gait assistance: +2 physical assistance, Max assist Gait Distance (Feet): 1 Feet Assistive device: Rolling walker (2 wheeled) Gait Pattern/deviations: Decreased dorsiflexion - right, Ataxic, Staggering right General Gait Details: Step forward with RLE, BUE supported on RW.  Significant LOB/lean toward R with inability to correct/return to midline. Required return to sitting.    ADL: ADL Overall ADL's : Needs assistance/impaired Eating/Feeding: Maximal assistance, Bed level Eating/Feeding Details (indicate cue type and reason): pt verbalized being hungry but does not initiate self feeding. pt needs cues to clear R side of mouth throughout session. pt with dentures Grooming: Wash/dry face, Sitting, Supervision/safety, Set up Grooming Details (indicate cue type and reason): sitting in recliner at sink Upper Body Bathing: Moderate assistance, Sitting Upper Body Bathing Details (indicate cue type and reason): simulated via applying lotion to RUE Lower Body Bathing: Total assistance Upper Body Dressing : Maximal assistance Lower Body Dressing: Total assistance Toilet Transfer: +2 for physical assistance, Maximal assistance Toilet Transfer Details (indicate cue type  and reason): simulated via stand pivot transfer from EOB >recliner Functional mobility during ADLs: Maximal assistance, +2 for physical assistance General ADL Comments: pt continues to present with impaired balance, impaired coordination in RUE/RLE, and impaired cognition  Cognition: Cognition Overall Cognitive Status: Impaired/Different from baseline Arousal/Alertness: Awake/alert Orientation Level: Oriented to person Year: 2022 (Word finding made it difficult; able to finish year when therapist started with "two thousand and...") Day of Week: Incorrect Awareness: Impaired Awareness Impairment: Intellectual impairment, Emergent impairment Problem Solving: Impaired Problem Solving Impairment: Functional basic Executive Function: Self Monitoring, Self Correcting Self Monitoring: Impaired (Impaired for functional, intact for verbal) Self Monitoring Impairment: Functional basic Self Correcting: Impaired (Impaired for functional, intact for verbal) Self Correcting Impairment: Functional basic Safety/Judgment: Other (comment) (suspect impaired) Cognition Arousal/Alertness: Awake/alert Behavior During Therapy: Flat affect, Impulsive Overall Cognitive Status: Impaired/Different from baseline Area of Impairment: Orientation, Attention, Memory, Following commands, Awareness, Safety/judgement, Problem solving Orientation Level: Disoriented to, Situation, Place Current Attention Level: Selective Memory: Decreased short-term memory, Decreased recall of precautions Following Commands: Follows one step commands consistently, Follows one step commands with increased time Safety/Judgement: Decreased awareness of deficits, Decreased awareness of safety Awareness: Intellectual Problem Solving: Slow processing, Difficulty sequencing, Requires verbal cues, Requires  tactile cues General Comments: pt unable to state location noted to be impulsive with mobility, follows commands with increased time. poor  awareness into deficits noted likely d/t R inattention, unable to determine R vs L side  Physical Exam: Blood pressure (!) 150/90, pulse 61, temperature 97.6 F (36.4 C), temperature source Axillary, resp. rate 18, height  (1.88 m), weight 86.2 kg, SpO2 96 %. Physical Exam Gen: no distress, normal appearing HEENT: oral mucosa pink and moist, NCAT Cardio: Reg rate Chest: normal effort, normal rate of breathing Abd: soft, non-distended Ext: no edema Psych: pleasant, normal affect Skin: intact Neurological:     Comments: Patient is alert.  Made eye contact with examiner.  He knows he is in the hospital but could not provide accurate date and time.  Mild dysarthria but intelligible.  Follows simple commands. RUE 0/5, RLE 2/5 strength.  Results for orders placed or performed during the hospital encounter of 06/23/21 (from the past 48 hour(s))  CBC     Status: Abnormal   Collection Time: 06/26/21  2:33 AM  Result Value Ref Range   WBC 4.8 4.0 - 10.5 K/uL   RBC 5.41 4.22 - 5.81 MIL/uL   Hemoglobin 15.2 13.0 - 17.0 g/dL   HCT 47.8 29.5 - 62.1 %   MCV 87.1 80.0 - 100.0 fL   MCH 28.1 26.0 - 34.0 pg   MCHC 32.3 30.0 - 36.0 g/dL   RDW 30.8 (H) 65.7 - 84.6 %   Platelets 149 (L) 150 - 400 K/uL   nRBC 0.0 0.0 - 0.2 %    Comment: Performed at [facility], [address], [place], [place] [address]  Basic metabolic panel     Status: Abnormal   Collection Time: 06/26/21  2:33 AM  Result Value Ref Range   Sodium 138 135 - 145 mmol/L   Potassium 3.4 (L) 3.5 - 5.1 mmol/L   Chloride 100 98 - 111 mmol/L   CO2 28 22 - 32 mmol/L   Glucose, Bld 84 70 - 99 mg/dL    Comment: Glucose reference range applies only to samples taken after fasting for at least 8 hours.   BUN 24 (H) 8 - 23 mg/dL   Creatinine, Ser 2.84 0.61 - 1.24 mg/dL   Calcium 9.8 8.9 - 13.2 mg/dL   GFR, Estimated >44 >01 mL/min    Comment: (NOTE) Calculated using the CKD-EPI Creatinine Equation (2021)    Anion gap 10 5 -  15    Comment: Performed at [facility], [address] [place], [place] [address]  CBC with Differential/Platelet     Status: Abnormal   Collection Time: 06/27/21  3:37 AM  Result Value Ref Range   WBC 5.5 4.0 - 10.5 K/uL   RBC 5.54 4.22 - 5.81 MIL/uL   Hemoglobin 15.6 13.0 - 17.0 g/dL   HCT 36.6 44.0 - 34.7 %   MCV 87.2 80.0 - 100.0 fL   MCH 28.2 26.0 - 34.0 pg   MCHC 32.3 30.0 - 36.0 g/dL   RDW 42.5 (H) 95.6 - 38.7 %   Platelets 156 150 - 400 K/uL   nRBC 0.0 0.0 - 0.2 %   Neutrophils Relative % 64 %   Neutro Abs 3.5 1.7 - 7.7 K/uL   Lymphocytes Relative 16 %   Lymphs Abs 0.9 0.7 - 4.0 K/uL   Monocytes Relative 16 %   Monocytes Absolute 0.9 0.1 - 1.0 K/uL   Eosinophils Relative 3 %  Eosinophils Absolute 0.2 0.0 - 0.5 K/uL   Basophils Relative 1 %   Basophils Absolute 0.1 0.0 - 0.1 K/uL   Immature Granulocytes 0 %   Abs Immature Granulocytes 0.02 0.00 - 0.07 K/uL    Comment: Performed at [facility], [address] [place], [place] [address]  Comprehensive metabolic panel     Status: Abnormal   Collection Time: 06/27/21  3:37 AM  Result Value Ref Range   Sodium 139 135 - 145 mmol/L   Potassium 4.1 3.5 - 5.1 mmol/L   Chloride 102 98 - 111 mmol/L   CO2 26 22 - 32 mmol/L   Glucose, Bld 79 70 - 99 mg/dL    Comment: Glucose reference range applies only to samples taken after fasting for at least 8 hours.   BUN 26 (H) 8 - 23 mg/dL   Creatinine, Ser 6.04 0.61 - 1.24 mg/dL   Calcium 9.7 8.9 - 54.0 mg/dL   Total Protein 7.5 6.5 - 8.1 g/dL   Albumin 3.6 3.5 - 5.0 g/dL   AST 28 15 - 41 U/L   ALT 16 0 - 44 U/L   Alkaline Phosphatase 98 38 - 126 U/L   Total Bilirubin 2.0 (H) 0.3 - 1.2 mg/dL   GFR, Estimated >98 >11 mL/min    Comment: (NOTE) Calculated using the CKD-EPI Creatinine Equation (2021)    Anion gap 11 5 - 15    Comment: Performed at [facility], [address] [place], [place] [address]  Phosphorus     Status: None   Collection Time:  06/27/21  3:37 AM  Result Value Ref Range   Phosphorus 3.5 2.5 - 4.6 mg/dL    Comment: Performed at [facility], [address] [place], [place] [address]  Magnesium     Status: None   Collection Time: 06/27/21  3:37 AM  Result Value Ref Range   Magnesium 1.9 1.7 - 2.4 mg/dL    Comment: Performed at [facility], [address], [place], [place] [address]   No results found.     Medical Problem List and Plan: 1.  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  Antithrombotics: -DVT/anticoagulation:  Mechanical: Sequential compression devices, below knee Bilateral lower extremities  -antiplatelet therapy: N/A 3. Pain: continue Neurontin 100 mg nightly, hydrocodone as needed 4. Mood: Provide emotional support  -antipsychotic agents: N/A 5. Neuropsych: This patient is capable of making decisions on his own behalf. 6. Skin/Wound Care: Routine skin checks 7. Fluids/Electrolytes/Nutrition: Routine in and outs with follow-up chemistries 8.  Dysphagia.  Dysphagia #2 nectar thick liquids.  Follow-up speech therapy 9.  Hypertension.  Continue Lasix 80 mg daily, hydralazine 50 mg every 8 hours, Cozaar 100 mg daily.  Monitor with increased mobility 10.  Combined congestive heart failure.  Lasix 80 mg daily.  Monitor for any signs of fluid overload. Daily weights.  11.  Chronic bilateral lower extremity edema/cellulitis.  Continue Lasix.  Complete course of doxycycline 12.  Atrial fibrillation.  Eliquis held due to ICH.  Cardiac rate controlled 13.  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  Follow-up cardiology services 14.  Hyperlipidemia.  Currently holding statin due to ICH 15.  Chronic thrombocytopenia.  Platelets 124,000.  Follow-up CBC  I have personally performed a face to face diagnostic evaluation, including, but not limited to relevant  history  and physical exam findings, of this patient and developed relevant assessment and plan.  Additionally, I have reviewed and concur with the physician assistant's documentation above.  Sula Soda, MD  Mcarthur Rossetti Angiulli, PA-C 06/27/2021

## 2021-06-27 NOTE — Care Management Important Message (Signed)
Important Message  Patient Details  Name: STIVEN KASPAR MRN: [id number] Date of Birth: Mar 02, 1946   Medicare Important Message Given:  Yes     Rhoda Waldvogel Stefan Church 06/27/2021, 3:27 PM

## 2021-06-27 NOTE — Progress Notes (Signed)
Inpatient Rehabilitation Medication Review by a Pharmacist  A complete drug regimen review was completed for this patient to identify any potential clinically significant medication issues.  High Risk Drug Classes Is patient taking? Indication by Medication  Antipsychotic No   Anticoagulant No   Antibiotic Yes Cellulitis: Doxycyline x1 more dose  Opioid Yes Pain control: hydrocodone-apap for moderate -severe pain  Antiplatelet No   Hypoglycemics/insulin No   Vasoactive Medication Yes CV:  hydralazine, losartan, furosemide  Chemotherapy No   Other Yes Neuromuscular: gabapentin     Type of Medication Issue Identified Description of Issue Recommendation(s)  Drug Interaction(s) (clinically significant)     Duplicate Therapy     Allergy     No Medication Administration End Date     Incorrect Dose     Additional Drug Therapy Needed  TRH discharge note 9/12 ll resumed the prior to admit Coreg at half the dose, Discharge order for Coreg 3.125 mg po BID.   (PTA coreg was 6.25 mg BID).  No orders on CIR admission for Coreg 3.125 mg BID. CIR team to evaluate patient on 06/28/21 and determine if Coreg needs to be resumed.   Significant med changes from prior encounter (inform family/care partners about these prior to discharge). Eliquis (apixaban) twice daily for h/o Afib prior to admission of previous hospital encounter. Apixaban  reversed and  now on hold due to cerebral hemorrhage.  F/u with CIR team if plan to resume apixaban or any anticoagulation.   Other  TRH discharge order from prior encounter resumed Co-enzyme Q-10, Ginger root, omega 3, probiotic, and senna-docusate.   These were not ordered on CIR admission.  CIR MD/PA to evaluate and determine if probiotic, senna-docusate and Omega 3 needs to resume. The co-enzyme Q-10 and ginger root should not be resumed per our [employer] policy to hold these medications while admitted.     Clinically significant medication issues were  identified that warrant physician communication and completion of prescribed/recommended actions by midnight of the next day:  Yes  Name of provider notified for urgent issues identified:   Provider Method of Notification: Follow up with CIR team in AM 06/28/21.    Time spent performing this drug regimen review (minutes):  15  Noah Delaine, [place] Clinical Pharmacist 06/27/2021 6:22 PM

## 2021-06-27 NOTE — Progress Notes (Signed)
Inpatient Rehab Admissions Coordinator:  There is a bed available in CIR for pt today.  Dr. Marland Mcalpine is aware and in agreement. Pt/daughter, NSG, and TOC are aware.   Wolfgang Phoenix, MS, CCC-SLP Admissions Coordinator [phone]

## 2021-06-27 NOTE — Progress Notes (Signed)
Tried to call [phone] to give report but no one answer.

## 2021-06-27 NOTE — Progress Notes (Signed)
Physical Therapy Treatment Patient Details Name: Carlos Welch MRN: [id number] DOB: Aug 13, 1946 Today's Date: 06/27/2021   History of Present Illness 75 y.o. 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 Was able to perform transfers and pre-gait activity with up to +2 max assist and RW for support. Pt preferred walker but had difficulty keeping R hand on it. PT supported R hand/wrist when attempting to grip walker. Educated pt on attending to RUE, propping it up on a pillow, and using strong L side to help weaker R side. Pt quick to report "I can't" when therapist asks him to utilize R side, however he is able to do more than he realizes when he does attempt. Pt/family anticipates d/c to CIR today. Will continue to follow acutely until he is transferred to rehab unit.    Recommendations for follow up therapy are one component of a multi-disciplinary discharge planning process, led by the attending physician.  Recommendations may be updated based on patient status, additional functional criteria and insurance authorization.  Follow Up Recommendations  CIR;Supervision/Assistance - 24 hour     Equipment Recommendations  Other (comment) (TBA)    Recommendations for Other Services       Precautions / Restrictions Precautions Precautions: Fall;Other (comment) Precaution Comments: R inattention, decr R sensation, small subluxation of R shoulder Restrictions Weight Bearing Restrictions: No     Mobility  Bed Mobility Overal bed mobility: Needs Assistance Bed Mobility: Supine to Sit;Sit to Supine     Supine to sit: Mod assist;+2 for physical assistance;HOB elevated Sit to supine: Mod assist;+2 for physical assistance   General bed mobility comments:  required cues to use rail and to center in bed    Transfers Overall transfer level: Needs assistance Equipment used: Rolling walker (2 wheeled);2 person hand held assist Transfers: Sit to/from [employer] Transfers Sit to Stand: Mod assist;+2 physical assistance         General transfer comment: VC's for hand placement on seated surface for safety, and attending to RUE. Pt powered up to full standing with +2 mod assist, and required assist to gain/maintain standing balance. Pt reaching for RW for support. Therapist supported R hand on grip of walker.  Ambulation/Gait Ambulation/Gait assistance: +2 physical assistance;Max assist   Assistive device: Rolling walker (2 wheeled) Gait Pattern/deviations: Decreased dorsiflexion - right;Ataxic;Staggering right Gait velocity: Very slow Gait velocity interpretation: <1.31 ft/sec, indicative of household ambulator General Gait Details: Pre gait activity and side stepping at EOB. Therapist facilitated weight shifting and pt was able to demonstrate lifting heel of RLE, marching with LLE, and taking side steps. Max assist was required to advance RLE for side steps.   Stairs             Wheelchair Mobility    Modified Rankin (Stroke Patients Only) Modified Rankin (Stroke Patients Only) Pre-Morbid Rankin Score: No significant disability Modified Rankin: Severe disability     Balance Overall balance assessment: Needs assistance Sitting-balance support: Single extremity supported;Feet supported Sitting balance-Leahy Scale: Fair Sitting balance - Comments: min guard and occasional min assist for sitting balance Postural control: Right lateral lean;Posterior lean Standing balance support: Bilateral upper extremity supported;During functional activity Standing balance-Leahy Scale: Zero Standing balance comment: +2 required  Cognition Arousal/Alertness: Awake/alert Behavior During Therapy:  Flat affect;Impulsive Overall Cognitive Status: Impaired/Different from baseline Area of Impairment: Orientation;Attention;Memory;Following commands;Awareness;Safety/judgement;Problem solving                 Orientation Level: Disoriented to;Situation;Place Current Attention Level: Selective Memory: Decreased short-term memory;Decreased recall of precautions Following Commands: Follows one step commands consistently;Follows one step commands with increased time Safety/Judgement: Decreased awareness of deficits;Decreased awareness of safety Awareness: Intellectual Problem Solving: Slow processing;Difficulty sequencing;Requires verbal cues;Requires tactile cues General Comments: Patient followed cues with extra time, self limiting with RUE/RLE use      Exercises General Exercises - Lower Extremity Long Arc Quad: 5 reps;Right;Seated Hip Flexion/Marching: 5 reps;Right;Seated Other Exercises Other Exercises: Pt reaching out with RUE to hit target x5 in sitting.    General Comments        Pertinent Vitals/Pain Pain Assessment: Faces Faces Pain Scale: Hurts little more Pain Location: R shoulder and RLE Pain Descriptors / Indicators: Grimacing;Discomfort Pain Intervention(s): Limited activity within patient's tolerance;Monitored during session;Repositioned    Home Living                      Prior Function            PT Goals (current goals can now be found in the care plan section) Acute Rehab PT Goals Patient Stated Goal: not stated PT Goal Formulation: With patient Time For Goal Achievement: 07/08/21 Potential to Achieve Goals: Good Progress towards PT goals: Progressing toward goals    Frequency    Min 4X/week      PT Plan Current plan remains appropriate    Co-evaluation PT/OT/SLP Co-Evaluation/Treatment: Yes Reason for Co-Treatment: Complexity of the patient's impairments (multi-system involvement);Necessary to address cognition/behavior during  functional activity;For patient/therapist safety;To address functional/ADL transfers PT goals addressed during session: Mobility/safety with mobility;Balance;Proper use of DME;Strengthening/ROM        AM-PAC PT "6 Clicks" Mobility   Outcome Measure  Help needed turning from your back to your side while in a flat bed without using bedrails?: A Lot Help needed moving from lying on your back to sitting on the side of a flat bed without using bedrails?: Total Help needed moving to and from a bed to a chair (including a wheelchair)?: Total Help needed standing up from a chair using your arms (e.g., wheelchair or bedside chair)?: Total Help needed to walk in hospital room?: Total Help needed climbing 3-5 steps with a railing? : Total 6 Click Score: 7    End of Session Equipment Utilized During Treatment: Gait belt Activity Tolerance: Patient tolerated treatment well;Patient limited by fatigue Patient left: in chair;with call bell/phone within reach;with chair alarm set Nurse Communication: Mobility status PT Visit Diagnosis: Other abnormalities of gait and mobility (R26.89);Other symptoms and signs involving the nervous system (R29.898);Hemiplegia and hemiparesis Hemiplegia - Right/Left: Right Hemiplegia - dominant/non-dominant: Dominant Hemiplegia - caused by: Nontraumatic intracerebral hemorrhage     Time: 5427-0623 PT Time Calculation (min) (ACUTE ONLY): 25 min  Charges:  $Gait Training: 8-22 mins                     Conni Slipper, PT, DPT Acute Rehabilitation Services Pager: [phone] Office: [phone]    Marylynn Pearson 06/27/2021, 3:35 PM

## 2021-06-27 NOTE — TOC Transition Note (Signed)
Transition of Care [facility]) - CM/SW Discharge Note   Patient Details  Name: Carlos Welch MRN: [id number] Date of Birth: 06-20-1946  [facility] CM/SW Contact:  Kermit Balo, RN Phone Number: 06/27/2021, 10:29 AM   Clinical Narrative:    Patient is discharging to CIR today. CM signing off.   Final next level of care: IP Rehab Facility Barriers to Discharge: No Barriers Identified   Patient Goals and CMS Choice     Choice offered to / list presented to : Patient  Discharge Placement                       Discharge Plan and Services                                     Social Determinants of Health (SDOH) Interventions     Readmission Risk Interventions No flowsheet data found.

## 2021-06-27 NOTE — H&P (Signed)
Physical Medicine and Rehabilitation Admission H&P  CC: ICH  HPI: Carlos Welch is a 75 year old right-handed male with history of atrial fibrillation maintained on Eliquis, aortic stenosis status post aortic valve replacement, CAD status post CABG, bilateral lower extremity cellulitis, combined CHF, hypertension, hyperlipidemia, thrombocytopenia, remote tobacco abuse.  Per chart review patient lives with his daughter and family.  1 level home.  Walks with a cane.  Reports dressing and bathing himself prior to admission independently.  He has good family support.  Presented 06/23/2021 with acute onset of right-sided weakness and slurred speech.  Cranial CT scan showed acute hemorrhage centered in the left thalamus with an estimated volume of 9 mL.  No intraventricular or extra axial extension of blood.  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  His chronic Eliquis was discontinued due to hematoma.  CT angiogram head and neck negative for LVO or intracranial hemorrhage.  Positive for bilateral cervical ICA pseudoaneurysms, 13 x 20 mm on the right and 9 x 9 mm on the left.  Patient did not receive tPA due to hematoma.  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  Multi foci of susceptibility artifact scattered throughout the supratentorial infratentorial compartment suggesting multiple prior micro bleeds raising suspicion for amyloid angiopathy.  Admission chemistries unremarkable except BUN 27.  Echocardiogram with ejection fraction of 60 to 65% no wall motion abnormalities.  Neurology follow-up close monitoring of blood pressure.  Maintain on a dysphagia #2 nectar thick liquid diet.  Therapy evaluations completed due to patient decreased functional mobility was admitted for a comprehensive rehab program. Currently very sleepy.   Review of Systems   Constitutional:  Negative for chills and fever.  HENT:  Negative for hearing loss.   Eyes:  Negative for blurred vision and double vision.  Respiratory:  Negative for cough and shortness of breath.   Cardiovascular:  Positive for palpitations and leg swelling. Negative for chest pain.  Gastrointestinal:  Positive for constipation. Negative for heartburn, nausea and vomiting.  Genitourinary:  Positive for urgency. Negative for dysuria, flank pain and hematuria.  Musculoskeletal:  Positive for joint pain.  Skin:  Negative for rash.  Neurological:  Positive for speech change and weakness.  All other systems reviewed and are negative. Past Medical History:  Diagnosis Date   A-fib (HCC)    Abscess of jaw 07/19/2020   Acute on chronic heart failure with preserved ejection fraction (HCC) 07/08/2020   Aortic stenosis    a. s/p pericardial AVR 2015.   Aortic valve stenosis 07/19/2020   Bacteremia 07/19/2020   Bacterial endocarditis    BPH (benign prostatic hyperplasia) 07/08/2020   CAD (coronary artery disease)    a. s/p CABG, AVR, LAA clipping 2015 at [facility].   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/08/2020   Elevated troponin 07/08/2020   Essential hypertension 07/08/2020   Generalized abdominal pain    History of noncompliance with medical treatment    Hyperlipemia 07/08/2020   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   Malnutrition (HCC)  MRSA bacteremia    Nonrheumatic mitral valve regurgitation 07/19/2020   Normocytic anemia 07/08/2020   Pressure injury of skin 05/12/2020   S/P aortic valve replacement with  bioprosthetic valve 2015   Severe malnutrition (HCC) 07/19/2020   Thrombocytopenia (HCC) 07/08/2020   TIA (transient ischemic attack)    Weight loss 07/19/2020   Past Surgical History:  Procedure Laterality Date   AORTIC VALVE REPLACEMENT     CORONARY ARTERY BYPASS GRAFT  2015   HERNIA REPAIR     REMOVAL OF IMPLANT  03/14/2020   TEE WITHOUT CARDIOVERSION N/A 05/13/2020   Procedure: TRANSESOPHAGEAL ECHOCARDIOGRAM (TEE);  Surgeon: Lewayne Bunting, MD;  Location: [facility] ENDOSCOPY;  Service: Cardiovascular;  Laterality: N/A;   Family History  Problem Relation Age of Onset   CAD Neg Hx    Social History:  reports that he quit smoking about 10 years ago. His smoking use included cigarettes and cigars. He started smoking about 58 years ago. He has a 141.00 pack-year smoking history. He has never used smokeless tobacco. He reports that he does not drink alcohol and does not use drugs. Allergies:  Allergies  Allergen Reactions   Chocolate Rash    9.9.2022 Daughter reports that the patient regularly eats chocolate without any issues.   Peanut-Containing Drug Products Rash   Medications Prior to Admission  Medication Sig Dispense Refill   acetaminophen (TYLENOL) 325 MG tablet Take 2 tablets (650 mg total) by mouth every 4 (four) hours as needed for mild pain (or temp > 37.5 C (99.5 F)).     carvedilol (COREG) 3.125 MG tablet Take 1 tablet (3.125 mg total) by mouth 2 (two) times daily with a meal.     co-enzyme Q-10 30 MG capsule Take 30 mg by mouth 3 (three) times daily.     doxycycline (VIBRA-TABS) 100 MG tablet Take 1 tablet (100 mg total) by mouth 2 (two) times daily. 20 tablet 0   furosemide (LASIX) 40 MG tablet TAKE 2 TABLETS BY MOUTH EVERY DAY (Patient taking differently: Take 80 mg by mouth daily.) 60 tablet 3   gabapentin (NEURONTIN) 100 MG capsule Take 1 capsule (100 mg total) by mouth at bedtime. 30 capsule 0   Ginger, Zingiber officinalis, (GINGER ROOT PO) Take 4 capsules by mouth daily.      hydrALAZINE (APRESOLINE) 50 MG tablet TAKE 1 TABLET BY MOUTH EVERY 8 HOURS. (Patient taking differently: Take 50 mg by mouth in the morning and at bedtime.) 90 tablet 3   losartan (COZAAR) 100 MG tablet TAKE 1 TABLET BY MOUTH ONCE DAILY 90 tablet 3   Omega-3 Fatty Acids (OMEGA 3 PO) Take 1 capsule by mouth daily.     ondansetron (ZOFRAN ODT) 4 MG disintegrating tablet Take 1 tablet (4 mg total) by mouth every 8 (eight) hours as needed for nausea or vomiting. 20 tablet 0   [START ON 06/28/2021] pantoprazole (PROTONIX) 40 MG tablet Take 1 tablet (40 mg total) by mouth daily. 30 tablet 0   Probiotic Product (PROBIOTIC ADVANCED PO) Take 1 capsule by mouth daily.     senna-docusate (SENOKOT-S) 8.6-50 MG tablet Take 1 tablet by mouth 2 (two) times daily.      Drug Regimen Review Drug regimen was reviewed and remains appropriate with no significant issues identified  Home: Home Living Family/patient expects to be discharged to:: Private residence Living Arrangements: Children, Other (Comment) (grandson) Available Help at Discharge: Family, Available 24 hours/day Type of Home: House Home Access: Level entry Home Layout: One level  Bathroom Shower/Tub: Hydrographic surveyor, Health visitor: Handicapped height Bathroom Accessibility: Yes Home Equipment: Grab bars - tub/shower, Hand held shower head, Cane - single point, Environmental consultant - 2 wheels Additional Comments: information from prior admission and from patient report  Lives With: Daughter, Other (Comment) (grandson)   Functional History: Prior Function Level of Independence: Needs assistance Gait / Transfers Assistance Needed: walks with cane ADL's / Homemaking Assistance Needed: reports dressing and bathing himself prior to this admission Communication / Swallowing Assistance Needed: SLP recommendations on wall   Functional Status:  Mobility: Bed Mobility Overal bed mobility: Needs Assistance Bed Mobility: Supine to Sit Supine  to sit: Mod assist, HOB elevated General bed mobility comments: increased time, cues for sequencing, assist with RLE and to elevate trunk Transfers Overall transfer level: Needs assistance Equipment used: Rolling walker (2 wheeled), 2 person hand held assist Transfers: Sit to/from Stand, Stand Pivot Transfers Sit to Stand: +2 physical assistance, +2 safety/equipment, Max assist Stand pivot transfers: +2 physical assistance, Max assist General transfer comment: assist to power up and stabilize balance. Sit to stand with RW with assist to maintain grip R hand. SPT bed to recliner without AD. Pt unable to take pivot steps. Ambulation/Gait Ambulation/Gait assistance: +2 physical assistance, Max assist Gait Distance (Feet): 1 Feet Assistive device: Rolling walker (2 wheeled) Gait Pattern/deviations: Decreased dorsiflexion - right, Ataxic, Staggering right General Gait Details: Step forward with RLE, BUE supported on RW.  Significant LOB/lean toward R with inability to correct/return to midline. Required return to sitting.   ADL: ADL Overall ADL's : Needs assistance/impaired Eating/Feeding: Maximal assistance, Bed level Eating/Feeding Details (indicate cue type and reason): pt verbalized being hungry but does not initiate self feeding. pt needs cues to clear R side of mouth throughout session. pt with dentures Grooming: Wash/dry face, Sitting, Supervision/safety, Set up Grooming Details (indicate cue type and reason): sitting in recliner at sink Upper Body Bathing: Moderate assistance, Sitting Upper Body Bathing Details (indicate cue type and reason): simulated via applying lotion to RUE Lower Body Bathing: Total assistance Upper Body Dressing : Maximal assistance Lower Body Dressing: Total assistance Toilet Transfer: +2 for physical assistance, Maximal assistance Toilet Transfer Details (indicate cue type and reason): simulated via stand pivot transfer from EOB >recliner Functional mobility  during ADLs: Maximal assistance, +2 for physical assistance General ADL Comments: pt continues to present with impaired balance, impaired coordination in RUE/RLE, and impaired cognition   Cognition: Cognition Overall Cognitive Status: Impaired/Different from baseline Arousal/Alertness: Awake/alert Orientation Level: Oriented to person Year: 2022 (Word finding made it difficult; able to finish year when therapist started with "two thousand and...") Day of Week: Incorrect Awareness: Impaired Awareness Impairment: Intellectual impairment, Emergent impairment Problem Solving: Impaired Problem Solving Impairment: Functional basic Executive Function: Self Monitoring, Self Correcting Self Monitoring: Impaired (Impaired for functional, intact for verbal) Self Monitoring Impairment: Functional basic Self Correcting: Impaired (Impaired for functional, intact for verbal) Self Correcting Impairment: Functional basic Safety/Judgment: Other (comment) (suspect impaired) Cognition Arousal/Alertness: Awake/alert Behavior During Therapy: Flat affect, Impulsive Overall Cognitive Status: Impaired/Different from baseline Area of Impairment: Orientation, Attention, Memory, Following commands, Awareness, Safety/judgement, Problem solving Orientation Level: Disoriented to, Situation, Place Current Attention Level: Selective Memory: Decreased short-term memory, Decreased recall of precautions Following Commands: Follows one step commands consistently, Follows one step commands with increased time Safety/Judgement: Decreased awareness of deficits, Decreased awareness of safety Awareness: Intellectual Problem Solving: Slow processing, Difficulty sequencing, Requires verbal cues, Requires tactile cues General Comments: pt unable to state location noted to  be impulsive with mobility, follows commands with increased time. poor awareness into deficits noted likely d/t R inattention, unable to determine R vs L  side  Physical Exam: Blood pressure 120/83, pulse 72, temperature 98 F (36.7 C), resp. rate 18, height  (1.88 m), weight 75.2 kg, SpO2 97 %. Physical Exam Gen: no distress, normal appearing HEENT: oral mucosa pink and moist, NCAT Cardio: Reg rate Chest: normal effort, normal rate of breathing Abd: soft, non-distended Ext: no edema Psych: pleasant, normal affect Skin: intact Neurological:     Comments: Patient is alert.  Made eye contact with examiner.  He knows he is in the hospital but could not provide accurate date and time.  Right sided inattention. Mild dysarthria but intelligible.  Follows simple commands. RUE 0/5, RLE 2/5 strength.  Results for orders placed or performed during the hospital encounter of 06/23/21 (from the past 48 hour(s))  CBC     Status: Abnormal   Collection Time: 06/26/21  2:33 AM  Result Value Ref Range   WBC 4.8 4.0 - 10.5 K/uL   RBC 5.41 4.22 - 5.81 MIL/uL   Hemoglobin 15.2 13.0 - 17.0 g/dL   HCT 16.1 09.6 - 04.5 %   MCV 87.1 80.0 - 100.0 fL   MCH 28.1 26.0 - 34.0 pg   MCHC 32.3 30.0 - 36.0 g/dL   RDW 40.9 (H) 81.1 - 91.4 %   Platelets 149 (L) 150 - 400 K/uL   nRBC 0.0 0.0 - 0.2 %    Comment: Performed at [facility], [address] [place], [place] [address]  Basic metabolic panel     Status: Abnormal   Collection Time: 06/26/21  2:33 AM  Result Value Ref Range   Sodium 138 135 - 145 mmol/L   Potassium 3.4 (L) 3.5 - 5.1 mmol/L   Chloride 100 98 - 111 mmol/L   CO2 28 22 - 32 mmol/L   Glucose, Bld 84 70 - 99 mg/dL    Comment: Glucose reference range applies only to samples taken after fasting for at least 8 hours.   BUN 24 (H) 8 - 23 mg/dL   Creatinine, Ser 6.21 0.61 - 1.24 mg/dL   Calcium 9.8 8.9 - 30.8 mg/dL   GFR, Estimated >65 >78 mL/min    Comment: (NOTE) Calculated using the CKD-EPI Creatinine Equation (2021)    Anion gap 10 5 - 15    Comment: Performed at [facility], [address], [place], [place] [address]   CBC with Differential/Platelet     Status: Abnormal   Collection Time: 06/27/21  3:37 AM  Result Value Ref Range   WBC 5.5 4.0 - 10.5 K/uL   RBC 5.54 4.22 - 5.81 MIL/uL   Hemoglobin 15.6 13.0 - 17.0 g/dL   HCT 95.2 84.1 - 32.4 %   MCV 87.2 80.0 - 100.0 fL   MCH 28.2 26.0 - 34.0 pg   MCHC 32.3 30.0 - 36.0 g/dL   RDW 40.1 (H) 02.7 - 25.3 %   Platelets 156 150 - 400 K/uL   nRBC 0.0 0.0 - 0.2 %   Neutrophils Relative % 64 %   Neutro Abs 3.5 1.7 - 7.7 K/uL   Lymphocytes Relative 16 %   Lymphs Abs 0.9 0.7 - 4.0 K/uL   Monocytes Relative 16 %   Monocytes Absolute 0.9 0.1 - 1.0 K/uL   Eosinophils Relative 3 %   Eosinophils Absolute 0.2 0.0 - 0.5 K/uL   Basophils Relative  1 %   Basophils Absolute 0.1 0.0 - 0.1 K/uL   Immature Granulocytes 0 %   Abs Immature Granulocytes 0.02 0.00 - 0.07 K/uL    Comment: Performed at [facility], [address], [place], [place] [address]  Comprehensive metabolic panel     Status: Abnormal   Collection Time: 06/27/21  3:37 AM  Result Value Ref Range   Sodium 139 135 - 145 mmol/L   Potassium 4.1 3.5 - 5.1 mmol/L   Chloride 102 98 - 111 mmol/L   CO2 26 22 - 32 mmol/L   Glucose, Bld 79 70 - 99 mg/dL    Comment: Glucose reference range applies only to samples taken after fasting for at least 8 hours.   BUN 26 (H) 8 - 23 mg/dL   Creatinine, Ser 3.79 0.61 - 1.24 mg/dL   Calcium 9.7 8.9 - 02.4 mg/dL   Total Protein 7.5 6.5 - 8.1 g/dL   Albumin 3.6 3.5 - 5.0 g/dL   AST 28 15 - 41 U/L   ALT 16 0 - 44 U/L   Alkaline Phosphatase 98 38 - 126 U/L   Total Bilirubin 2.0 (H) 0.3 - 1.2 mg/dL   GFR, Estimated >09 >73 mL/min    Comment: (NOTE) Calculated using the CKD-EPI Creatinine Equation (2021)    Anion gap 11 5 - 15    Comment: Performed at [facility], [address], [place], [place] [address]  Phosphorus     Status: None   Collection Time: 06/27/21  3:37 AM  Result Value Ref Range   Phosphorus 3.5 2.5 - 4.6 mg/dL    Comment: Performed  at [facility], [address] [place], [place] [address]  Magnesium     Status: None   Collection Time: 06/27/21  3:37 AM  Result Value Ref Range   Magnesium 1.9 1.7 - 2.4 mg/dL    Comment: Performed at [facility], [address] [place], [place] [address]   No results found.     Medical Problem List and Plan: 1.  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  Antithrombotics: -DVT/anticoagulation:  Mechanical: Sequential compression devices, below knee Bilateral lower extremities  -antiplatelet therapy: N/A 3. Pain: continue Neurontin 100 mg nightly, hydrocodone as needed 4. Mood: Provide emotional support  -antipsychotic agents: N/A 5. Neuropsych: This patient is capable of making decisions on his own behalf. 6. Skin/Wound Care: Routine skin checks 7. Fluids/Electrolytes/Nutrition: Routine in and outs with follow-up chemistries 8.  Dysphagia.  Dysphagia #2 nectar thick liquids.  Follow-up speech therapy 9.  Hypertension.  Continue Lasix 80 mg daily, hydralazine 50 mg every 8 hours, Cozaar 100 mg daily.  Monitor with increased mobility 10.  Combined congestive heart failure.  Lasix 80 mg daily.  Monitor for any signs of fluid overload. Daily weights.  11.  Chronic bilateral lower extremity edema/cellulitis.  Continue Lasix.  Complete course of doxycycline 12.  Atrial fibrillation.  Eliquis held due to ICH.  Cardiac rate controlled 13.  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  Follow-up cardiology services 14.  Hyperlipidemia.  Currently holding statin due to ICH 15.  Chronic thrombocytopenia.  Platelets 124,000.  Follow-up CBC  I have personally performed a face to face diagnostic evaluation, including, but not limited to relevant history and physical exam findings, of this patient and developed relevant assessment and  plan.   Additionally, I have reviewed and concur with the physician assistant's documentation above.  Sula SodaKrutika Kaycen Whitworth, MD  Mcarthur Rossettianiel J Angiulli, PA-C

## 2021-06-27 NOTE — Progress Notes (Signed)
Occupational Therapy Treatment Patient Details Name: Carlos Welch MRN: [id number] DOB: 08-Sep-1946 Today's Date: 06/27/2021   History of present illness 75 y.o. male admitted with ICH L thalamic R side weakness and numbness PMH HF aortic valve stenosis s/p valve replacement BPH afib cad s/p CABG HTn thrombocytopenia,HLD,  TIA mitral valvle regurgitation bil LE cellulitis,   OT comments  Patient was seen in conjunction with PT to maximize patient performance.  Patient seen to address eob sitting balance, standing with rw, and grooming.  Patient demonstrated limited AROM of rue and required assistance to use rw.  Patient was returned to supine and RUE positioned in bed.  Patient is expected to move to inpatient rehab.   Recommendations for follow up therapy are one component of a multi-disciplinary discharge planning process, led by the attending physician.  Recommendations may be updated based on patient status, additional functional criteria and insurance authorization.    Follow Up Recommendations  CIR    Equipment Recommendations  Other (comment)    Recommendations for Other Services Rehab consult    Precautions / Restrictions Precautions Precautions: Fall;Other (comment) Precaution Comments: R inattention, decr R sensation       Mobility Bed Mobility Overal bed mobility: Needs Assistance Bed Mobility: Supine to Sit;Sit to Supine     Supine to sit: Mod assist;+2 for physical assistance;HOB elevated Sit to supine: Mod assist;+2 for physical assistance   General bed mobility comments: required cues to use rail and to center in bed    Transfers Overall transfer level: Needs assistance     Sit to Stand: Mod assist;+2 physical assistance         General transfer comment: performed standing from eob with vcs for body mechanics, pushing up to stand and walker use for balance    Balance Overall balance assessment: Needs assistance Sitting-balance support: Single  extremity supported;Feet supported Sitting balance-Leahy Scale: Fair Sitting balance - Comments: min guard and occasional min assist for sitting balance Postural control: Right lateral lean;Posterior lean Standing balance support: Bilateral upper extremity supported;During functional activity Standing balance-Leahy Scale: Poor Standing balance comment: Assistance of 2 to stand with assistance for RUE to use rw                           ADL either performed or assessed with clinical judgement   ADL Overall ADL's : Needs assistance/impaired     Grooming: Wash/dry face;Minimal assistance;Sitting Grooming Details (indicate cue type and reason): performed seated on eob, required assistance for balance                               General ADL Comments: Performed light grooming seated on eob     Vision       Perception     Praxis      Cognition Arousal/Alertness: Awake/alert Behavior During Therapy: Flat affect;Impulsive Overall Cognitive Status: Impaired/Different from baseline Area of Impairment: Orientation;Attention;Memory;Following commands;Awareness;Safety/judgement;Problem solving                 Orientation Level: Disoriented to;Situation;Place Current Attention Level: Selective Memory: Decreased short-term memory;Decreased recall of precautions Following Commands: Follows one step commands consistently;Follows one step commands with increased time Safety/Judgement: Decreased awareness of deficits;Decreased awareness of safety Awareness: Intellectual Problem Solving: Slow processing;Difficulty sequencing;Requires verbal cues;Requires tactile cues General Comments: Patient followed cues with extra time, self limiting with RLE use  Exercises     Shoulder Instructions       General Comments      Pertinent Vitals/ Pain       Pain Assessment: Faces Faces Pain Scale: Hurts little more Pain Location: R shoulder and RLE Pain  Descriptors / Indicators: Grimacing;Discomfort Pain Intervention(s): Repositioned  Home Living                                          Prior Functioning/Environment              Frequency  Min 2X/week        Progress Toward Goals  OT Goals(current goals can now be found in the care plan section)  Progress towards OT goals: Progressing toward goals  Acute Rehab OT Goals Patient Stated Goal: not stated OT Goal Formulation: Patient unable to participate in goal setting Time For Goal Achievement: 07/08/21 Potential to Achieve Goals: Fair ADL Goals Pt Will Perform Grooming: with min assist;sitting Pt Will Transfer to Toilet: with max assist;stand pivot transfer;bedside commode Additional ADL Goal #1: pt will utilize patch or glasses prior to transfers mod I to help with monoccular vision for decrease fall  risk during transfers. Additional ADL Goal #2: pt will complete bed mobility supervision as precursor to adls  Plan Discharge plan remains appropriate;Frequency remains appropriate    Co-evaluation    PT/OT/SLP Co-Evaluation/Treatment: Yes Reason for Co-Treatment: Complexity of the patient's impairments (multi-system involvement);Necessary to address cognition/behavior during functional activity   OT goals addressed during session: Strengthening/ROM      AM-PAC OT "6 Clicks" Daily Activity     Outcome Measure   Help from another person eating meals?: A Lot Help from another person taking care of personal grooming?: A Lot Help from another person toileting, which includes using toliet, bedpan, or urinal?: A Lot Help from another person bathing (including washing, rinsing, drying)?: A Lot Help from another person to put on and taking off regular upper body clothing?: A Lot Help from another person to put on and taking off regular lower body clothing?: Total 6 Click Score: 11    End of Session Equipment Utilized During Treatment: Gait  belt;Rolling walker  OT Visit Diagnosis: Unsteadiness on feet (R26.81);Muscle weakness (generalized) (M62.81);History of falling (Z91.81)   Activity Tolerance Patient limited by fatigue   Patient Left in bed;with call bell/phone within reach;with bed alarm set   Nurse Communication Mobility status        Time: 5465-6812 OT Time Calculation (min): 25 min  Charges: OT General Charges $OT Visit: 1 Visit OT Treatments $Neuromuscular Re-education: 8-22 mins  Alfonse Flavors, OTA   Carlos Welch 06/27/2021, 11:44 AM

## 2021-06-27 NOTE — Plan of Care (Signed)
A&O x 1.

## 2021-06-27 NOTE — Progress Notes (Signed)
Patient arrived at approximately 1630. Patient is A&O x1.  Patient knows only his first name. Patient is not sure if he is married or not, does not know time, date, state, or president. Patient does know he has a daughter Carlos Welch. Patient C/O bilateral lower leg pain 6/10. [place]co given per protocol. Patient ride side is flaccid, hand warm to touch, cap refill less than 3 seconds. Able to move left arm and leg, skin warm to touch, cap refill less than 3 seconds. Patient up in bed, assistance with feeding, appetite fair. L/M for daughter patients new room number and personal items protocol. Bed in low position, personal items within reach. Call light within reach, safety maintained.

## 2021-06-27 NOTE — Discharge Summary (Addendum)
Physician Discharge Summary  Carlos Welch ZOX:[id number] DOB: 03/04/1946 DOA: 06/23/2021  PCP: [name], PA-C  Admit date: 06/23/2021 Discharge date: 06/27/2021  Admitted From: Home Disposition:  CIR  Recommendations for Outpatient Follow-up:  Follow up with PCP in 1-2 weeks Follow up with Neurology within 4 weeks weeks Please obtain CMP/CBC, Mag, Phos in one week Please follow up on the following pending results:  Home Health: No Equipment/Devices: None    Discharge Condition: Stable  CODE STATUS: FULL CODE   Diet recommendation: Dysphagia 2 Diet with Nectar Thick Liquids   Brief/Interim Summary: The patient is a 75 year old Caucasian male with past medical history significant for but not limited to atrial fibrillation was previously on anticoagulation with Eliquis, aortic stenosis, coronary artery disease, bilateral lower extremity cellulitis, combined CHF, essential hypertension, hyperlipidemia, thrombocytopenia who presented to the hospital for confusion and right-sided weakness and numbness and was found to have a left-sided thalamic ICH.  He was admitted to the neuro ICU and transferred to [facility] service on 06/25/21.  PT OT evaluated recommending CIR and he has a bed available. He was treated for the following while hospitalized:  Discharge Diagnoses:  Active Problems:   Intracerebral hemorrhage (HCC)  Left Thalamic ICH  -In the setting of HTN and use of Anticoagulation with Eliquis  -CT Head showed "Acute hemorrhage centered in the left thalamus with an estimated volume of 9 mL. No intraventricular or extra-axial extension of blood. Surrounding edema but only mild regional mass effect. 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 There is mild left PCA P3 segment  irregularity and stenosis. Positive for bilateral cervical ICA pseudoaneurysms, 13 x 20 mm on the right and 9 x 9 mm on the left. This raises the  possibility of underlying Fibromuscular Dysplasia (FMD). There is no  aneurism-associated stenosis. Positive also for extensive Calcified Atherosclerosis. 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 There is only mild ICA siphon and Basilar Artery stenosis. Aortic Atherosclerosis (ICD10-I70.0). Partially  visible chronic lung disease, prior CABG." -MRI showed "Stable size of intraparenchymal hematoma centered in the left thalamus with surrounding vasogenic edema and regional mass effect. 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 No focus of abnormal contrast enhancement.." -Repeat CT Head showed "Overall unchanged acute hemorrhage centered in the left thalamus, without intraventricular or extra-axial extension of blood. Unchanged mild surrounding edema and mild regional mass effect." -Eliquis has been held and Neurology recommending no  -Lipid Panel as below and HbA1c was 5.2 -ECHOCARDIOGRAM showed 60-65% and showed "No intracardiac source of embolism detected on this transthoracic study. A transesophageal echocardiogram is recommended to exclude cardiac source of embolism if clinically indicated."  -PT/OT recommending CIR -Follow up with Neurology within 4 weeks of Discharge   Permanent Atrial Fibrillation -BB held due to Bradycardia but will resume at half the dose of 3.125 mg po BID  -Anticoagulation held due to above    HTN -Now off of Cleviprex gtt -C/w Hydralazine 50 mg po q8h and Losartan 100 mg po Daily -C/w Hydralazine 5-10 mg IV q4hprn HBP SBP >160 -His BB was on hold due to Bradycardia but likely ok to resume at D/C at half the dose  -C/w Furosemide 80 mg po  Daily -Continue to Monitor BP  per Protocol; BP Goal is <160 given stable Hematoma on Repeat CT -Last BP reading was 138/90   CAD -S/p CABG, AVR, and LAA Clipping  -Meds as above -Not complaining of any Chest Pain  Hyperbilirubinemia -Mild and likely reactive -Patient's T Bili was 2.0 -Continue to Monitor and Trend and repeat within 1 week and if Bilirubin is still elevated consider RUQ U/S and Acute Hepatitis Panel    Chronic Combined Systolic and Diastolic CHF -C/w Meds as as above -Strict I's and O's and Daily Weights  -Patient is -5.259 Liters since Admission -Continue to Monitor for S/Sx of Volume Overload  Elevated BUN -Mild. BUN went from 21 -> 22 -> 24 -> 26 -Currently not on any steroids and does not have any evidence of upper GI bleeding -Continue to Monitor and trend and repeat CMP within 1 week   HLD -Lipid Panel showed total cholesterol/HDL ratio ratio of 3.9, cholesterol of 143, HDL of 37, LDL of 92, triglycerides of 72, VLDL 14 -Currently holding Statin given ICH -Per Neurology consider Statin at D/C    Tobacco Abuse -Smoking Cessation counseling given    Recent Bilateral Leg Cellulitis -C/w Doxycycline 100 mg po BID for 10 days total'; Has 5 more days left   Thrombocytopenia -Improving and appears chronic -Patient's Platelet Count went from 114 -> 126 -> 149 -> 156 -Continue to Monitor for S/Sx of Bleeding; No overt bleeding noted -Repeat CBC in the AM   Hypokalemia -Patient's K+ was mild at 3.4 and improved to 4.1 -Replete with po Kcl 40 mEQ BID x2 yesterday  -Continue to Monitor and Replete as Necessary -Repeat CMP in the AM    Discharge Instructions Discharge Instructions     Call MD for:  difficulty breathing, headache or visual disturbances   Complete by: As directed    Call MD for:  extreme fatigue   Complete by: As directed    Call MD for:  hives   Complete by: As directed    Call MD for:  persistant dizziness or light-headedness    Complete by: As directed    Call MD for:  persistant nausea and vomiting   Complete by: As directed    Call MD for:  redness, tenderness, or signs of infection (pain, swelling, redness, odor or green/yellow discharge around incision site)   Complete by: As directed    Call MD for:  severe uncontrolled pain   Complete by: As directed    Call MD for:  temperature >100.4   Complete by: As directed    Diet - low sodium heart healthy   Complete by: As directed    Dysphagia 2 Diet with Nectar Thick Liquids   Discharge instructions   Complete by: As directed    You were cared for by a hospitalist during your hospital stay. If you have any questions about your discharge medications or the care you received while you were in the hospital after you are discharged, you can call the unit and ask to speak with the hospitalist on call if the hospitalist that took care of you is not available. Once you are discharged, your primary care physician will handle any further medical issues. Please note that NO REFILLS for any discharge medications will be authorized once you are discharged, as it is imperative that you return to your primary care physician (or establish a relationship with a primary care physician if you do not have one) for your aftercare needs so that they can reassess your  need for medications and monitor your lab values.  Follow up with PCP and Neurology within 1-2 weeks. Take all medications as prescribed. If symptoms change or worsen please return to the ED for evaluation   Increase activity slowly   Complete by: As directed       Allergies as of 06/27/2021       Reactions   Chocolate Rash   9.9.2022 Daughter reports that the patient regularly eats chocolate without any issues.   Peanut-containing Drug Products Rash        Medication List     STOP taking these medications    ammonium lactate 12 % lotion Commonly known as: LAC-HYDRIN   apixaban 5 MG Tabs tablet Commonly  known as: Eliquis   ibuprofen 200 MG tablet Commonly known as: ADVIL       TAKE these medications    acetaminophen 325 MG tablet Commonly known as: TYLENOL Take 2 tablets (650 mg total) by mouth every 4 (four) hours as needed for mild pain (or temp > 37.5 C (99.5 F)).   carvedilol 3.125 MG tablet Commonly known as: COREG Take 1 tablet (3.125 mg total) by mouth 2 (two) times daily with a meal. What changed:  medication strength how much to take   co-enzyme Q-10 30 MG capsule Take 30 mg by mouth 3 (three) times daily.   doxycycline 100 MG tablet Commonly known as: VIBRA-TABS Take 1 tablet (100 mg total) by mouth 2 (two) times daily.   furosemide 40 MG tablet Commonly known as: LASIX TAKE 2 TABLETS BY MOUTH EVERY DAY   gabapentin 100 MG capsule Commonly known as: NEURONTIN Take 1 capsule (100 mg total) by mouth at bedtime.   GINGER ROOT PO Take 4 capsules by mouth daily.   hydrALAZINE 50 MG tablet Commonly known as: APRESOLINE TAKE 1 TABLET BY MOUTH EVERY 8 HOURS. What changed: when to take this   losartan 100 MG tablet Commonly known as: COZAAR TAKE 1 TABLET BY MOUTH ONCE DAILY   OMEGA 3 PO Take 1 capsule by mouth daily.   ondansetron 4 MG disintegrating tablet Commonly known as: Zofran ODT Take 1 tablet (4 mg total) by mouth every 8 (eight) hours as needed for nausea or vomiting.   pantoprazole 40 MG tablet Commonly known as: PROTONIX Take 1 tablet (40 mg total) by mouth daily. Start taking on: June 28, 2021   PROBIOTIC ADVANCED PO Take 1 capsule by mouth daily.   senna-docusate 8.6-50 MG tablet Commonly known as: Senokot-S Take 1 tablet by mouth 2 (two) times daily.        Follow-up Information     Marvel Plan, MD Follow up in 2 month(s).   Specialty: Neurology Contact information: [address] [place] [address] [place] [address] [phone]                Allergies  Allergen Reactions   Chocolate Rash    9.9.2022 Daughter  reports that the patient regularly eats chocolate without any issues.   Peanut-Containing Drug Products Rash   Consultations: Neurology  Procedures/Studies: CT Angio Head W or Wo Contrast  Result Date: 06/23/2021 CLINICAL DATA:  75 year old male code stroke presentation with acute left thalamic hemorrhage. EXAM: CT ANGIOGRAPHY HEAD AND NECK TECHNIQUE: Multidetector CT imaging of the head and neck was performed using the standard protocol during bolus administration of intravenous contrast. Multiplanar CT image reconstructions and MIPs were obtained to evaluate the vascular anatomy. Carotid stenosis measurements (when applicable) are obtained utilizing NASCET  criteria, using the distal internal carotid diameter as the denominator. CONTRAST:  50mL OMNIPAQUE IOHEXOL 350 MG/ML SOLN COMPARISON:  Plain head CT 0359 hours today.  Chest CT 08/03/2020. FINDINGS: CTA NECK Skeleton: Absent dentition. Small metallic object embedded at the midline mandible symphysis. Prior sternotomy. No acute osseous abnormality identified. Upper chest: Stable chronic thickening along the superior left major fissure. Other mild pulmonary increased interstitial markings and mild dependent atelectasis appears stable. No superior mediastinal lymphadenopathy. A small volume of retained fluid is noted in the upper thoracic esophagus which is nondilated. Other neck: No acute finding, neck mass, or lymphadenopathy identified. Aortic arch: Tortuous aortic arch with extensive atherosclerosis. 3 vessel arch configuration. Right carotid system: Brachiocephalic artery plaque and tortuosity without stenosis. Mildly tortuous right CCA with mild plaque, no stenosis. Mild soft and calcified plaque at the right ICA origin and bulb without proximal right ICA stenosis. But in the upper neck at the C1 level there is partially calcified fusiform right ICA pseudoaneurysm extending about 2 cm in length (series 11, image 64) and up to 13 mm diameter. There is  no associated stenosis. Left carotid system: Tortuous proximal left CCA with mild calcified plaque and no stenosis. Calcified plaque at the posterior left ICA origin without stenosis. Distal to the bulb there is a saccular pseudoaneurysm of the left ICA directed anteriorly and measuring up to 9 mm diameter, also about 9 mm in length. No associated stenosis. See series 6, image 92 and series 10, image 95. Vertebral arteries: Soft and calcified plaque in the proximal right subclavian artery without significant stenosis. Moderate calcified plaque at the right vertebral artery origin but only mild stenosis results. Additional right V1 and V2 segment calcified plaque with up to 50% stenosis at the C5-C6 level on series 9, image 244. The right vertebral remains patent to the skull base. Mild to moderate proximal left subclavian plaque without significant stenosis. Soft and calcified plaque near the left vertebral artery origin with only mild stenosis. Left vertebral is non dominant and mildly tortuous in the neck but patent to the skull base without additional plaque or stenosis. CTA HEAD Posterior circulation: Dominant right vertebral artery. Extensive bilateral V4 calcified plaque (series 9, image 154) with moderate to severe bilateral V4 segment stenosis proximal to the vertebrobasilar junction. PICA origins and vertebrobasilar junction remain patent. The basilar artery is moderately calcified (series 9, image 115) but without hemodynamically significant stenosis. Bilateral SCA and PCA origins are patent without stenosis. Posterior communicating arteries are diminutive or absent. Right PCA branches are within normal limits. There is mild left PCA P3 segment irregularity on series 14, image 23 in proximity to the left thalamic hemorrhage. But there is no CTA spot sign, left PCA aneurysm or other significant stenosis. Anterior circulation: Both ICA siphons are patent but moderately calcified. Still, only mild siphon  stenosis results. Patent carotid termini, MCA and ACA origins. Tortuous A1 segments, the right is mildly dominant. Diminutive or absent anterior communicating artery. ACA branches are within normal limits. Left MCA M1 segment and trifurcation are patent without stenosis. Left MCA branches are mildly irregular. Right MCA M1 segment and bifurcation are patent without stenosis. Right MCA branches are within normal limits. Venous sinuses: Early contrast timing, not well evaluated. Anatomic variants: Dominant right vertebral artery. Review of the MIP images confirms the above findings IMPRESSION: 1. Negative for LVO, CTA spot sign, or intracranial aneurysm in association with the left thalamic hemorrhage. There is mild left PCA P3 segment irregularity and stenosis.  2. Positive for bilateral cervical ICA pseudoaneurysms, 13 x 20 mm on the right and 9 x 9 mm on the left. This raises the possibility of underlying Fibromuscular Dysplasia (FMD). There is no aneurism-associated stenosis. 3. Positive also for extensive Calcified Atherosclerosis. 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 There is only mild ICA siphon and Basilar Artery stenosis. Aortic Atherosclerosis (ICD10-I70.0). 4. Partially visible chronic lung disease, prior CABG. Electronically Signed   By: Odessa Fleming M.D.   On: 06/23/2021 04:34   DG Chest 2 View  Result Date: 06/22/2021 CLINICAL DATA:  hx of chf. 9 lb weight gain since 06/13/2021. EXAM: CHEST - 2 VIEW COMPARISON:  02/25/202, FINDINGS: Unchanged cardiomediastinal silhouette with prior median sternotomy and CABG. Unchanged left atrial occlusion clip. There are diffuse interstitial opacities. Small bilateral pleural effusions. No visible pneumothorax. Bilateral shoulder degenerative changes. No acute osseous abnormality. Thoracic spondylosis. IMPRESSION: Mild pulmonary edema and small bilateral pleural effusions. Electronically Signed   By: Caprice Renshaw M.D.   On: 06/22/2021 11:11    CT HEAD WO CONTRAST ( )  Result Date: 06/23/2021 CLINICAL DATA:  Follow-up intracranial hemorrhage. EXAM: CT HEAD WITHOUT CONTRAST TECHNIQUE: Contiguous axial images were obtained from the base of the skull through the vertex without intravenous contrast. COMPARISON:  06/23/2021 3:59 a.m. FINDINGS: Brain: Redemonstrated hyperdense hemorrhage centered in the left thalamus, measuring up to 2.0 x 3.2 x 2.5 cm (AP x TR x CC) (series 3, image 17 and series 5, image 45), unchanged when remeasured similarly. Again noted is extension of the hemorrhage into the left midbrain. No intraventricular or extra-axial extension of hemorrhage. Unchanged mild surrounding edema with limited regional mass effect. No hydrocephalus. No significant midline shift. Periventricular white matter changes, likely the sequela of chronic small vessel ischemic disease. No acute infarction. Vascular: No hyperdense vessel. Atherosclerotic calcifications in the intracranial carotid and vertebral arteries. Skull: Normal. Negative for fracture or focal lesion. Sinuses/Orbits: No acute finding. Other: The mastoids are well aerated. IMPRESSION: Overall unchanged acute hemorrhage centered in the left thalamus, without intraventricular or extra-axial extension of blood. Unchanged mild surrounding edema and mild regional mass effect. Electronically Signed   By: Wiliam Ke M.D.   On: 06/23/2021 12:21   CT Angio Neck W and/or Wo Contrast  Result Date: 06/23/2021 CLINICAL DATA:  75 year old male code stroke presentation with acute left thalamic hemorrhage. EXAM: CT ANGIOGRAPHY HEAD AND NECK TECHNIQUE: Multidetector CT imaging of the head and neck was performed using the standard protocol during bolus administration of intravenous contrast. Multiplanar CT image reconstructions and MIPs were obtained to evaluate the vascular anatomy. Carotid stenosis measurements (when applicable) are obtained utilizing NASCET criteria, using the distal internal  carotid diameter as the denominator. CONTRAST:  50mL OMNIPAQUE IOHEXOL 350 MG/ML SOLN COMPARISON:  Plain head CT 0359 hours today.  Chest CT 08/03/2020. FINDINGS: CTA NECK Skeleton: Absent dentition. Small metallic object embedded at the midline mandible symphysis. Prior sternotomy. No acute osseous abnormality identified. Upper chest: Stable chronic thickening along the superior left major fissure. Other mild pulmonary increased interstitial markings and mild dependent atelectasis appears stable. No superior mediastinal lymphadenopathy. A small volume of retained fluid is noted in the upper thoracic esophagus which is nondilated. Other neck: No acute finding, neck mass, or lymphadenopathy identified. Aortic arch: Tortuous aortic arch with extensive atherosclerosis. 3 vessel arch configuration. Right carotid system: Brachiocephalic artery plaque and tortuosity without stenosis. Mildly tortuous right CCA with mild plaque, no stenosis. Mild soft and calcified plaque at the right  ICA origin and bulb without proximal right ICA stenosis. But in the upper neck at the C1 level there is partially calcified fusiform right ICA pseudoaneurysm extending about 2 cm in length (series 11, image 64) and up to 13 mm diameter. There is no associated stenosis. Left carotid system: Tortuous proximal left CCA with mild calcified plaque and no stenosis. Calcified plaque at the posterior left ICA origin without stenosis. Distal to the bulb there is a saccular pseudoaneurysm of the left ICA directed anteriorly and measuring up to 9 mm diameter, also about 9 mm in length. No associated stenosis. See series 6, image 92 and series 10, image 95. Vertebral arteries: Soft and calcified plaque in the proximal right subclavian artery without significant stenosis. Moderate calcified plaque at the right vertebral artery origin but only mild stenosis results. Additional right V1 and V2 segment calcified plaque with up to 50% stenosis at the C5-C6  level on series 9, image 244. The right vertebral remains patent to the skull base. Mild to moderate proximal left subclavian plaque without significant stenosis. Soft and calcified plaque near the left vertebral artery origin with only mild stenosis. Left vertebral is non dominant and mildly tortuous in the neck but patent to the skull base without additional plaque or stenosis. CTA HEAD Posterior circulation: Dominant right vertebral artery. Extensive bilateral V4 calcified plaque (series 9, image 154) with moderate to severe bilateral V4 segment stenosis proximal to the vertebrobasilar junction. PICA origins and vertebrobasilar junction remain patent. The basilar artery is moderately calcified (series 9, image 115) but without hemodynamically significant stenosis. Bilateral SCA and PCA origins are patent without stenosis. Posterior communicating arteries are diminutive or absent. Right PCA branches are within normal limits. There is mild left PCA P3 segment irregularity on series 14, image 23 in proximity to the left thalamic hemorrhage. But there is no CTA spot sign, left PCA aneurysm or other significant stenosis. Anterior circulation: Both ICA siphons are patent but moderately calcified. Still, only mild siphon stenosis results. Patent carotid termini, MCA and ACA origins. Tortuous A1 segments, the right is mildly dominant. Diminutive or absent anterior communicating artery. ACA branches are within normal limits. Left MCA M1 segment and trifurcation are patent without stenosis. Left MCA branches are mildly irregular. Right MCA M1 segment and bifurcation are patent without stenosis. Right MCA branches are within normal limits. Venous sinuses: Early contrast timing, not well evaluated. Anatomic variants: Dominant right vertebral artery. Review of the MIP images confirms the above findings IMPRESSION: 1. Negative for LVO, CTA spot sign, or intracranial aneurysm in association with the left thalamic hemorrhage.  There is mild left PCA P3 segment irregularity and stenosis. 2. Positive for bilateral cervical ICA pseudoaneurysms, 13 x 20 mm on the right and 9 x 9 mm on the left. This raises the possibility of underlying Fibromuscular Dysplasia (FMD). There is no aneurism-associated stenosis. 3. Positive also for extensive Calcified Atherosclerosis. 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 There is only mild ICA siphon and Basilar Artery stenosis. Aortic Atherosclerosis (ICD10-I70.0). 4. Partially visible chronic lung disease, prior CABG. Electronically Signed   By: Odessa Fleming M.D.   On: 06/23/2021 04:34   MR BRAIN W WO CONTRAST  Result Date: 06/24/2021 CLINICAL DATA:  Cerebral hemorrhage suspected. EXAM: MRI HEAD WITHOUT AND WITH CONTRAST TECHNIQUE: Multiplanar, multiecho pulse sequences of the brain and surrounding structures were obtained without and with intravenous contrast. CONTRAST:  7.46mL GADAVIST GADOBUTROL 1 MMOL/ML IV SOLN COMPARISON:  Head CT June 23, 2021 FINDINGS: Brain: No significant interval change in size of the intraparenchymal hematoma in the left thalamus measuring approximately 3.2 x 2.2 x 2.0 cm with surrounding vasogenic edema with mass effect posterior limb of internal capsule and left cerebral peduncle. Multiple foci of susceptibility artifact are seen scattered throughout the supratentorial and infratentorial parenchyma, as well as in the right cortical sulci suggesting amyloid angiopathy. Remote lacunar infarct in the right postcentral gyrus. Scattered and confluent foci of T2 hyperintensity are seen within the white matter of the cerebral hemispheres and within the pons, nonspecific, most likely related to chronic small vessel ischemia. No focus of abnormal contrast enhancement identified. No evidence of ischemic infarct, hydrocephalus or extra-axial collection. Vascular: Normal flow voids. Skull and upper cervical spine: Normal marrow signal. Sinuses/Orbits:  Negative. Other: None. IMPRESSION: 1. Stable size of intraparenchymal hematoma centered in the left thalamus with surrounding vasogenic edema and regional mass effect. 2. 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 3. No focus of abnormal contrast enhancement. Electronically Signed   By: Baldemar Lenis M.D.   On: 06/24/2021 09:19   Chest Port 1 View  Result Date: 06/23/2021 CLINICAL DATA:  75 year old male code stroke presentation, left thalamic hemorrhage. EXAM: PORTABLE CHEST 1 VIEW COMPARISON:  Chest radiographs 06/22/2021 and earlier. FINDINGS: Portable AP upright view at 0442 hours. Lower lung volumes. Cardiomegaly and diffuse bilateral pulmonary interstitial opacity appear mildly increased from yesterday. Small right pleural effusion is stable. No pneumothorax. Visualized tracheal air column is within normal limits. Prior CABG. No acute osseous abnormality identified. IMPRESSION: Lower lung volumes with increased appearance of cardiomegaly and acute pulmonary edema since yesterday. Stable right pleural effusion. Electronically Signed   By: Odessa Fleming M.D.   On: 06/23/2021 05:42   DG Swallowing Func-Speech Pathology  Result Date: 06/24/2021 Table formatting from the original result was not included. Objective Swallowing Evaluation: Type of Study: MBS-Modified Barium Swallow Study  Patient Details Name: ARIQ KHAMIS MRN: [id number] Date of Birth: [birth year] Today's Date: 06/24/2021 Time: SLP Start Time (ACUTE ONLY): 0820 -SLP Stop Time (ACUTE ONLY): 0835 SLP Time Calculation (min) (ACUTE ONLY): 15 min Past Medical History: Past Medical History: Diagnosis Date  A-fib (HCC)   Abscess of jaw 07/19/2020  Acute on chronic heart failure with preserved ejection fraction (HCC) 07/08/2020  Aortic stenosis   a. s/p pericardial AVR 2015.  Aortic valve stenosis  07/19/2020  Bacteremia 07/19/2020  Bacterial endocarditis   BPH (benign prostatic hyperplasia) 07/08/2020  CAD (coronary artery disease)   a. s/p CABG, AVR, LAA clipping 2015 at [facility].  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/08/2020  Elevated troponin 07/08/2020  Essential hypertension 07/08/2020  Generalized abdominal pain   History of noncompliance with medical treatment   Hyperlipemia 07/08/2020  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  Malnutrition (HCC)   MRSA bacteremia   Nonrheumatic mitral valve regurgitation 07/19/2020  Normocytic anemia 07/08/2020  Pressure injury of skin 05/12/2020  S/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 2015  Severe malnutrition (HCC) 07/19/2020  Thrombocytopenia (HCC) 07/08/2020  TIA (transient ischemic attack)   Weight loss 07/19/2020 Past Surgical History: Past  Surgical History: Procedure Laterality Date  AORTIC VALVE REPLACEMENT    CORONARY ARTERY BYPASS GRAFT  2015  HERNIA REPAIR    REMOVAL OF IMPLANT  03/14/2020  TEE WITHOUT CARDIOVERSION N/A 05/13/2020  Procedure: TRANSESOPHAGEAL ECHOCARDIOGRAM (TEE);  Surgeon: Lewayne Bunting, MD;  Location: [facility] ENDOSCOPY;  Service: Cardiovascular;  Laterality: N/A; HPI: 74 y.o. male with PMH: aFib, aortic stenosis, CAD, bilateral lower extremity cellulitis, combined CHF, HTN, HLD, thrombocytopenia, presented toEd via EMS for evaluation of abrupt onset of right-sided weakness and numbness. LVO positive code stroke was activated. Noncontrasted head CT showed left thalamic bleed around 9 cc without IVH.  Subjective: Pt awake and alert but having trouble keeping eyes open.  Assessment / Plan / Recommendation CHL IP CLINICAL IMPRESSIONS 06/24/2021 Clinical Impression Mild-mod oropharyngeal demonstrated with silent aspiration. Mild difficulty maintainng bolus cohesion resulting in sublingual spillage with thin barium. He exhibited rapid and inefficient mastication of graham cracker pieces in puree. He achieves adequate laryngeal vestibular closure but mistimed leading to pyriform sinus filling and penetration (PAS 3, 4)  (throat clear) and aspiration (PAS 8 silent) ensues. Compensatory strategies not attempted at this time given cognitive-linguistic impairments and as he improves, will be able to evaluate. Vallecular residue was minimal and not problematic. Timing and safety with nectar thick was improved. Recommend continue nectar and Dys 2 texture, pills crushed, check for oral pocketing, straws allowed and assist when eating. ST to follow. SLP Visit Diagnosis Dysphagia, oropharyngeal phase (R13.12) Attention and concentration deficit following -- Frontal lobe and executive function deficit following -- Impact on safety and function Moderate aspiration risk   CHL IP TREATMENT RECOMMENDATION 06/24/2021 Treatment Recommendations Therapy as outlined in treatment plan below   Prognosis 06/24/2021 Prognosis for Safe Diet Advancement Good Barriers to Reach Goals Cognitive deficits;Language deficits Barriers/Prognosis Comment -- CHL IP DIET RECOMMENDATION 06/24/2021 SLP Diet Recommendations Dysphagia 2 (Fine chop) solids;Nectar thick liquid Liquid Administration via Cup;Straw Medication Administration Crushed with puree Compensations Lingual sweep for clearance of pocketing;Small sips/bites;Slow rate;Clear throat intermittently Postural Changes Seated upright at 90 degrees   CHL IP OTHER RECOMMENDATIONS 06/24/2021 Recommended Consults -- Oral Care Recommendations Oral care BID Other Recommendations --   CHL IP FOLLOW UP RECOMMENDATIONS 06/24/2021 Follow up Recommendations Inpatient Rehab   CHL IP  FREQUENCY AND DURATION 06/24/2021 Speech Therapy Frequency (ACUTE ONLY) min 2x/week Treatment Duration 2 weeks      CHL IP ORAL PHASE 06/24/2021 Oral Phase Impaired Oral - Pudding Teaspoon -- Oral - Pudding Cup -- Oral - Honey Teaspoon -- Oral - Honey Cup -- Oral - Nectar Teaspoon -- Oral - Nectar Cup Decreased bolus cohesion Oral - Nectar Straw WFL Oral - Thin Teaspoon -- Oral - Thin Cup Right anterior bolus loss Oral - Thin Straw WFL Oral - Puree -- Oral - Mech Soft (No Data) Oral - Regular -- Oral - Multi-Consistency -- Oral - Pill -- Oral Phase - Comment --  CHL IP PHARYNGEAL PHASE 06/24/2021 Pharyngeal Phase Impaired Pharyngeal- Pudding Teaspoon -- Pharyngeal -- Pharyngeal- Pudding Cup -- Pharyngeal -- Pharyngeal- Honey Teaspoon -- Pharyngeal -- Pharyngeal- Honey Cup -- Pharyngeal -- Pharyngeal- Nectar Teaspoon -- Pharyngeal -- Pharyngeal- Nectar Cup WFL Pharyngeal -- Pharyngeal- Nectar Straw Delayed swallow initiation-pyriform sinuses Pharyngeal -- Pharyngeal- Thin Teaspoon -- Pharyngeal -- Pharyngeal- Thin Cup Penetration/Aspiration during swallow;Pharyngeal residue - valleculae;Delayed swallow initiation-pyriform sinuses Pharyngeal Material enters airway, CONTACTS cords and then ejected out;Material enters airway, remains ABOVE vocal cords and not ejected out Pharyngeal- Thin Straw Penetration/Aspiration during swallow;Delayed swallow initiation-pyriform sinuses  Pharyngeal Material enters airway, passes BELOW cords without attempt by patient to eject out (silent aspiration) Pharyngeal- Puree -- Pharyngeal -- Pharyngeal- Mechanical Soft Pharyngeal residue - valleculae;Pharyngeal residue - pyriform Pharyngeal -- Pharyngeal- Regular -- Pharyngeal -- Pharyngeal- Multi-consistency -- Pharyngeal -- Pharyngeal- Pill -- Pharyngeal -- Pharyngeal Comment --  CHL IP CERVICAL ESOPHAGEAL PHASE 06/24/2021 Cervical Esophageal Phase WFL Pudding Teaspoon -- Pudding Cup -- Honey Teaspoon -- Honey Cup -- Nectar Teaspoon -- Nectar Cup  -- Nectar Straw -- Thin Teaspoon -- Thin Cup -- Thin Straw -- Puree -- Mechanical Soft -- Regular -- Multi-consistency -- Pill -- Cervical Esophageal Comment -- Royce Macadamia 06/24/2021, 9:48 AM              ECHOCARDIOGRAM COMPLETE  Result Date: 06/23/2021    ECHOCARDIOGRAM REPORT   Patient Name:   HAYK DIVIS Ganger Date of Exam: 06/23/2021 Medical Rec #:  [id number]    Height:       74.0 in Accession #:    [id number]   Weight:       190.0 lb Date of Birth:  Sep 08, 1946   BSA:          2.127 m Patient Age:    74 years     BP:           125/74 mmHg Patient Gender: M            HR:           66 bpm. Exam Location:  Inpatient Procedure: 2D Echo, 3D Echo, Cardiac Doppler and Color Doppler Indications:    Stroke  History:        Patient has prior history of Echocardiogram examinations, most                 recent 08/09/2020. CHF, Abnormal ECG and Prior CABG, TIA, Aortic                 Valve Disease and Mitral Valve Disease, Arrythmias:Atrial                 Fibrillation, Signs/Symptoms:Bacteremia; Risk                 Factors:Hypertension. Aortic stenosis.  Sonographer:    Sheralyn Boatman RDCS Referring Phys: [id number] ASHISH ARORA  Sonographer Comments: Technically difficult study due to poor echo windows. Study interrupted. Patient supine with thin habitus. IMPRESSIONS  1. Left ventricular ejection fraction, by estimation, is 60 to 65%. The left ventricle has normal function. The left ventricle has no regional wall motion abnormalities. There is moderate left ventricular hypertrophy. Left ventricular diastolic parameters are indeterminate.  2. Right ventricular systolic function is moderately reduced. The right ventricular size is mildly enlarged. Tricuspid regurgitation signal is inadequate for assessing PA pressure.  3. Left atrial size was mildly dilated.  4. Right atrial size was moderately dilated.  5. The mitral valve is abnormal. Moderate mitral annular calcification. Eccentric mitral regurgitation jet, poorly  visualized on current study, however was moderate to severe on TEE 04/2020. There does appear to be pulmonic vein systolic reversal, which suggests severe MR. Consider TEE for further evaluation  6. There is a bioprosthetic valve present in the aortic position     Aortic valve regurgitation is not visualized. Echo findings are consistent with normal structure and function of the aortic valve prosthesis. Aortic valve mean gradient measures 11.0 mmHg, stable from prior echoes  7. Aortic dilatation noted. 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  8. The inferior vena cava is dilated in size with <50% respiratory variability, suggesting right atrial pressure of 15 mmHg. Conclusion(s)/Recommendation(s): No intracardiac source of embolism detected on this transthoracic study. A transesophageal echocardiogram is recommended to exclude cardiac source of embolism if clinically indicated. FINDINGS  Left Ventricle: Left ventricular ejection fraction, by estimation, is 60 to 65%. The left ventricle has normal function. The left ventricle has no regional wall motion abnormalities. The left ventricular internal cavity size was normal in size. There is  moderate left ventricular hypertrophy. Left ventricular diastolic parameters are indeterminate. Right Ventricle: The right ventricular size is mildly enlarged. Right vetricular wall thickness was not well visualized. Right ventricular systolic function is moderately reduced. Tricuspid regurgitation signal is inadequate for assessing PA pressure. Left Atrium: Left atrial size was mildly dilated. Right Atrium: Right atrial size was moderately dilated. Pericardium: There is no evidence of pericardial effusion. Mitral Valve: The mitral valve is abnormal. Moderate mitral annular calcification. Moderate to severe mitral valve regurgitation. MV peak gradient, 15.4 mmHg. The mean mitral valve gradient is 3.5 mmHg. Tricuspid Valve: The tricuspid valve is normal in  structure. Tricuspid valve regurgitation is not demonstrated. Aortic Valve: The aortic valve has been repaired/replaced. Aortic valve regurgitation is not visualized. Aortic valve mean gradient measures 11.0 mmHg. Aortic valve peak gradient measures 22.7 mmHg. Aortic valve area, by VTI measures 1.55 cm. There is a  bioprosthetic valve present in the aortic position. Echo findings are consistent with normal structure and function of the aortic valve prosthesis. Pulmonic Valve: The pulmonic valve was not well visualized. Pulmonic valve regurgitation is not visualized. Aorta: The aortic root is normal in size and structure and aortic dilatation noted. 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 Venous: The inferior vena cava is dilated in size with less than 50% respiratory variability, suggesting right atrial pressure of 15 mmHg. IAS/Shunts: The interatrial septum was not well visualized.  LEFT VENTRICLE PLAX 2D LVIDd:         5.50 cm LVIDs:         4.00 cm LV PW:         1.40 cm LV IVS:        1.40 cm LVOT diam:     2.00 cm     3D Volume EF: LV SV:         78          3D EF:        59 % LV SV Index:   37          LV EDV:       225 ml LVOT Area:     3.14 cm    LV ESV:       92 ml                            LV SV:        133 ml  LV Volumes (MOD) LV vol d, MOD A2C: 87.4 ml LV vol d, MOD A4C: 81.4 ml LV vol s, MOD A2C: 38.7 ml LV vol s, MOD A4C: 25.1 ml LV SV MOD A2C:     48.7 ml LV SV MOD A4C:     81.4 ml LV SV MOD BP:      54.4 ml RIGHT VENTRICLE            IVC RV S prime:     4.56 cm/s  IVC diam: 3.10  cm TAPSE (M-mode): 0.7 cm LEFT ATRIUM             Index       RIGHT ATRIUM           Index LA diam:        5.20 cm 2.45 cm/m  RA Area:     28.20 cm LA Vol (A2C):   65.3 ml 30.71 ml/m RA Volume:   92.30 ml  43.40 ml/m LA Vol (A4C):   93.4 ml 43.92 ml/m LA Biplane Vol: 82.6 ml 38.84 ml/m  AORTIC VALVE AV Area (Vmax):    1.70 cm AV Area (Vmean):   1.64 cm AV Area (VTI):     1.55 cm AV Vmax:            238.00 cm/s AV Vmean:          154.000 cm/s AV VTI:            0.502 m AV Peak Grad:      22.7 mmHg AV Mean Grad:      11.0 mmHg LVOT Vmax:         129.00 cm/s LVOT Vmean:        80.500 cm/s LVOT VTI:          0.248 m LVOT/AV VTI ratio: 0.49  AORTA Ao Root diam: 3.70 cm Ao Asc diam:  4.30 cm MITRAL VALVE MV Area (PHT): 2.60 cm     SHUNTS MV Area VTI:   1.46 cm     Systemic VTI:  0.25 m MV Peak grad:  15.4 mmHg    Systemic Diam: 2.00 cm MV Mean grad:  3.5 mmHg MV Vmax:       1.96 m/s MV Vmean:      78.8 cm/s MV Decel Time: 292 msec MV E velocity: 177.00 cm/s Epifanio Lesches MD Electronically signed by Epifanio Lesches MD Signature Date/Time: 06/23/2021/4:07:23 PM    Final    CT HEAD CODE STROKE WO CONTRAST  Addendum Date: 06/23/2021   ADDENDUM REPORT: 06/23/2021 04:34 ADDENDUM: Salient findings were communicated to Dr. Wilford Corner at 0416 hours on 06/23/2021 by text page via the [facility] messaging system. Electronically Signed   By: Odessa Fleming M.D.   On: 06/23/2021 04:34   Result Date: 06/23/2021 CLINICAL DATA:  Code stroke.  75 year old male. EXAM: CT HEAD WITHOUT CONTRAST TECHNIQUE: Contiguous axial images were obtained from the base of the skull through the vertex without intravenous contrast. COMPARISON:  Head CT 04/14/2014. FINDINGS: Brain: Oval and crescent-shaped hyperdense hemorrhage centered within the left thalamus encompasses 21 x 32 x 28 mm (AP by transverse by CC) for an estimated volume 9 mL. No intraventricular or extra-axial extension of blood is identified. There is surrounding edema but only mild regional mass effect. A small focus of hemorrhage tracks into the left midbrain (series 5, image 41). Elsewhere patchy bilateral cerebral white matter hypodensity and a chronic appearing lacunar infarct of the left basal ganglia have progressed since 2015. Overall cerebral volume has not significantly changed. No midline shift, ventriculomegaly, or evidence of cortically based acute infarction. Vascular:  Extensive Calcified atherosclerosis at the skull base. There is a degree of generalized intracranial artery tortuosity. No suspicious intracranial vascular hyperdensity. Skull: Stable and intact. Sinuses/Orbits: Visualized paranasal sinuses and mastoids are stable and well aerated. Other: There is scattered scalp vessel calcified atherosclerosis. No acute orbit or scalp soft tissue finding. ASPECTS [facility] Stroke Program Early CT Score) Total score (0-10 with 10 being normal): Not applicable, acute  hemorrhage. IMPRESSION: 1. Acute hemorrhage centered in the left thalamus with an estimated volume of 9 mL. No intraventricular or extra-axial extension of blood. Surrounding edema but only mild regional mass effect. 2. 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 3. Generalized intracranial artery tortuosity. Electronically Signed: By: Odessa Fleming M.D. On: 06/23/2021 04:18     Subjective: Seen and examined at bedside and states that he is not feeling as well today and did not have a good restful night.  Could not elaborate why he is not feeling well but vital signs were stable and he is already asleep.  No other concerns or plans at this time.  He is medically stable to be discharged to [facility] and will need to follow-up with neurology within 1 month.  Discharge Exam: Vitals:   06/27/21 0500 06/27/21 0827  BP: (!) 150/90 138/90  Pulse:  74  Resp: 18 16  Temp:  97.7 F (36.5 C)  SpO2:  97%   Vitals:   06/27/21 0023 06/27/21 0400 06/27/21 0500 06/27/21 0827  BP: (!) 153/100 (!) 160/109 (!) 150/90 138/90  Pulse:  61  74  Resp: Temp: 97.7 F (36.5 C) 97.6 F (36.4 C)  97.7 F (36.5 C)  TempSrc: Axillary Axillary  Axillary  SpO2: 97% 96%  97%  Weight:      Height:       General: Pt is alert, awake, not in acute distress Cardiovascular: RRR, S1/S2 +, no rubs, no gallops Respiratory: Diminished bilaterally, no wheezing, no rhonchi Abdominal: Soft, NT, ND,  bowel sounds + Extremities: no edema, no cyanosis; has Right sided hemiplegia and weakness  The results of significant diagnostics from this hospitalization (including imaging, microbiology, ancillary and laboratory) are listed below for reference.    Microbiology: Recent Results (from the past 240 hour(s))  Resp Panel by RT-PCR (Flu A&B, Covid) Nasopharyngeal Swab     Status: None   Collection Time: 06/23/21  4:42 AM   Specimen: Nasopharyngeal Swab; Nasopharyngeal(NP) swabs in vial transport medium  Result Value Ref Range Status   SARS Coronavirus 2 by RT PCR NEGATIVE NEGATIVE Final    Comment: (NOTE) SARS-CoV-2 target nucleic acids are NOT DETECTED.  The SARS-CoV-2 RNA is generally detectable in upper respiratory specimens during the acute phase of infection. The lowest concentration of SARS-CoV-2 viral copies this assay can detect is 138 copies/mL. A negative result does not preclude SARS-Cov-2 infection and should not be used as the sole basis for treatment or other patient management decisions. A negative result may occur with  improper specimen collection/handling, submission of specimen other than nasopharyngeal swab, presence of viral mutation(s) within the areas targeted by this assay, and inadequate number of viral copies(<138 copies/mL). A negative result must be combined with clinical observations, patient history, and epidemiological information. The expected result is Negative.  Fact Sheet for Patients:  BloggerCourse.com  Fact Sheet for Healthcare Providers:  SeriousBroker.it  This test is no t yet approved or cleared by the [place] FDA and  has been authorized for detection and/or diagnosis of SARS-CoV-2 by FDA under an Emergency Use Authorization (EUA). This EUA will remain  in effect (meaning this test can be used) for the duration of the COVID-19 declaration under Section 564(b)(1) of the Act,  21 U.S.C.section 360bbb-3(b)(1), unless the authorization is terminated  or revoked sooner.       Influenza A by PCR NEGATIVE NEGATIVE Final   Influenza B by PCR NEGATIVE  NEGATIVE Final    Comment: (NOTE) The Xpert Xpress SARS-CoV-2/FLU/RSV plus assay is intended as an aid in the diagnosis of influenza from Nasopharyngeal swab specimens and should not be used as a sole basis for treatment. Nasal washings and aspirates are unacceptable for Xpert Xpress SARS-CoV-2/FLU/RSV testing.  Fact Sheet for Patients: BloggerCourse.com  Fact Sheet for Healthcare Providers: SeriousBroker.it  This test is not yet approved or cleared by the [place] FDA and has been authorized for detection and/or diagnosis of SARS-CoV-2 by FDA under an Emergency Use Authorization (EUA). This EUA will remain in effect (meaning this test can be used) for the duration of the COVID-19 declaration under Section 564(b)(1) of the Act, 21 U.S.C. section 360bbb-3(b)(1), unless the authorization is terminated or revoked.  Performed at [facility], [address], [place], [place] [address]   MRSA Next Gen by PCR, Nasal     Status: None   Collection Time: 06/23/21  5:33 AM   Specimen: Nasal Mucosa; Nasal Swab  Result Value Ref Range Status   MRSA by PCR Next Gen NOT DETECTED NOT DETECTED Final    Comment: (NOTE) The GeneXpert MRSA Assay (FDA approved for NASAL specimens only), is one component of a comprehensive MRSA colonization surveillance program. It is not intended to diagnose MRSA infection nor to guide or monitor treatment for MRSA infections. Test performance is not FDA approved in patients less than 33 years old. Performed at [facility], [address] [place], [place] [address]     Labs: BNP (last 3 results) Recent Labs    08/01/20 1939 08/08/20 2120 12/10/20 1705  BNP 1,200.1* 1,288.7* 273.3*   Basic Metabolic Panel: Recent  Labs  Lab 06/23/21 0355 06/23/21 0401 06/24/21 0219 06/25/21 0325 06/26/21 0233 06/27/21 0337  NA 137 139 138 138 138 139  K 3.8 3.9 3.9 3.6 3.4* 4.1  CL 102 106 99 102 100 102  CO2 24  --  28 26 28 26   GLUCOSE 88 86 75 73 84 79  BUN 27* 30* 21 22 24* 26*  CREATININE 1.04 1.10 1.34* 1.15 1.17 1.19  CALCIUM 10.0  --  9.2 9.4 9.8 9.7  MG  --   --   --   --   --  1.9  PHOS  --   --   --   --   --  3.5   Liver Function Tests: Recent Labs  Lab 06/22/21 1032 06/23/21 0355 06/27/21 0337  AST 22 25 28   ALT 12 13 16   ALKPHOS 125* 117 98  BILITOT 1.4* 1.4* 2.0*  PROT 7.5 7.1 7.5  ALBUMIN 4.1 3.6 3.6   No results for input(s): LIPASE, AMYLASE in the last 168 hours. No results for input(s): AMMONIA in the last 168 hours. CBC: Recent Labs  Lab 06/23/21 0355 06/23/21 0401 06/24/21 0219 06/25/21 0325 06/26/21 0233 06/27/21 0337  WBC 4.1  --  4.5 4.1 4.8 5.5  NEUTROABS 2.6  --   --   --   --  3.5  HGB 14.1 15.0 12.9* 14.0 15.2 15.6  HCT 45.3 44.0 41.0 42.6 47.1 48.3  MCV 89.7  --  88.0 86.6 87.1 87.2  PLT 124*  --  114* 126* 149* 156   Cardiac Enzymes: No results for input(s): CKTOTAL, CKMB, CKMBINDEX, TROPONINI in the last 168 hours. BNP: Invalid input(s): POCBNP CBG: Recent Labs  Lab 06/23/21 0353  GLUCAP 73   D-Dimer No results for input(s): DDIMER in the last 72  hours. Hgb A1c No results for input(s): HGBA1C in the last 72 hours. Lipid Profile No results for input(s): CHOL, HDL, LDLCALC, TRIG, CHOLHDL, LDLDIRECT in the last 72 hours. Thyroid function studies No results for input(s): TSH, T4TOTAL, T3FREE, THYROIDAB in the last 72 hours.  Invalid input(s): FREET3 Anemia work up No results for input(s): VITAMINB12, FOLATE, FERRITIN, TIBC, IRON, RETICCTPCT in the last 72 hours. Urinalysis    Component Value Date/Time   COLORURINE YELLOW 08/07/2020 0324   APPEARANCEUR CLEAR 08/07/2020 0324   LABSPEC >1.030 (H) 08/07/2020 0324   PHURINE 5.5 08/07/2020 0324    GLUCOSEU NEGATIVE 08/07/2020 0324   HGBUR NEGATIVE 08/07/2020 0324   BILIRUBINUR SMALL (A) 08/07/2020 0324   KETONESUR NEGATIVE 08/07/2020 0324   PROTEINUR 100 (A) 08/07/2020 0324   UROBILINOGEN 0.2 04/14/2014 1108   NITRITE NEGATIVE 08/07/2020 0324   LEUKOCYTESUR NEGATIVE 08/07/2020 0324   Sepsis Labs Invalid input(s): PROCALCITONIN,  WBC,  LACTICIDVEN Microbiology Recent Results (from the past 240 hour(s))  Resp Panel by RT-PCR (Flu A&B, Covid) Nasopharyngeal Swab     Status: None   Collection Time: 06/23/21  4:42 AM   Specimen: Nasopharyngeal Swab; Nasopharyngeal(NP) swabs in vial transport medium  Result Value Ref Range Status   SARS Coronavirus 2 by RT PCR NEGATIVE NEGATIVE Final    Comment: (NOTE) SARS-CoV-2 target nucleic acids are NOT DETECTED.  The SARS-CoV-2 RNA is generally detectable in upper respiratory specimens during the acute phase of infection. The lowest concentration of SARS-CoV-2 viral copies this assay can detect is 138 copies/mL. A negative result does not preclude SARS-Cov-2 infection and should not be used as the sole basis for treatment or other patient management decisions. A negative result may occur with  improper specimen collection/handling, submission of specimen other than nasopharyngeal swab, presence of viral mutation(s) within the areas targeted by this assay, and inadequate number of viral copies(<138 copies/mL). A negative result must be combined with clinical observations, patient history, and epidemiological information. The expected result is Negative.  Fact Sheet for Patients:  BloggerCourse.com  Fact Sheet for Healthcare Providers:  SeriousBroker.it  This test is no t yet approved or cleared by the [place] FDA and  has been authorized for detection and/or diagnosis of SARS-CoV-2 by FDA under an Emergency Use Authorization (EUA). This EUA will remain  in effect (meaning this  test can be used) for the duration of the COVID-19 declaration under Section 564(b)(1) of the Act, 21 U.S.C.section 360bbb-3(b)(1), unless the authorization is terminated  or revoked sooner.       Influenza A by PCR NEGATIVE NEGATIVE Final   Influenza B by PCR NEGATIVE NEGATIVE Final    Comment: (NOTE) The Xpert Xpress SARS-CoV-2/FLU/RSV plus assay is intended as an aid in the diagnosis of influenza from Nasopharyngeal swab specimens and should not be used as a sole basis for treatment. Nasal washings and aspirates are unacceptable for Xpert Xpress SARS-CoV-2/FLU/RSV testing.  Fact Sheet for Patients: BloggerCourse.com  Fact Sheet for Healthcare Providers: SeriousBroker.it  This test is not yet approved or cleared by the [place] FDA and has been authorized for detection and/or diagnosis of SARS-CoV-2 by FDA under an Emergency Use Authorization (EUA). This EUA will remain in effect (meaning this test can be used) for the duration of the COVID-19 declaration under Section 564(b)(1) of the Act, 21 U.S.C. section 360bbb-3(b)(1), unless the authorization is terminated or revoked.  Performed at [facility], [address], [place], [place] [address]   MRSA Next Gen  by PCR, Nasal     Status: None   Collection Time: 06/23/21  5:33 AM   Specimen: Nasal Mucosa; Nasal Swab  Result Value Ref Range Status   MRSA by PCR Next Gen NOT DETECTED NOT DETECTED Final    Comment: (NOTE) The GeneXpert MRSA Assay (FDA approved for NASAL specimens only), is one component of a comprehensive MRSA colonization surveillance program. It is not intended to diagnose MRSA infection nor to guide or monitor treatment for MRSA infections. Test performance is not FDA approved in patients less than 81 years old. Performed at [facility], [address], [place], [place] [address]    Time coordinating discharge: 35  minutes  SIGNED:  Merlene Laughter, DO [facility] 06/27/2021, 12:55 PM Pager is on AMION  If 7PM-7AM, please contact night-coverage [email or web address]

## 2021-06-28 DIAGNOSIS — I610 Nontraumatic intracerebral hemorrhage in hemisphere, subcortical: Secondary | ICD-10-CM

## 2021-06-28 LAB — COMPREHENSIVE METABOLIC PANEL
ALT: 17 U/L (ref 0–44)
AST: 28 U/L (ref 15–41)
Albumin: 3.6 g/dL (ref 3.5–5.0)
Alkaline Phosphatase: 102 U/L (ref 38–126)
Anion gap: 10 (ref 5–15)
BUN: 30 mg/dL — ABNORMAL HIGH (ref 8–23)
CO2: 30 mmol/L (ref 22–32)
Calcium: 9.8 mg/dL (ref 8.9–10.3)
Chloride: 98 mmol/L (ref 98–111)
Creatinine, Ser: 1.33 mg/dL — ABNORMAL HIGH (ref 0.61–1.24)
GFR, Estimated: 56 mL/min — ABNORMAL LOW (ref >60)
Glucose, Bld: 103 mg/dL — ABNORMAL HIGH (ref 70–99)
Potassium: 3.8 mmol/L (ref 3.5–5.1)
Sodium: 138 mmol/L (ref 135–145)
Total Bilirubin: 1.9 mg/dL — ABNORMAL HIGH (ref 0.3–1.2)
Total Protein: 7.9 g/dL (ref 6.5–8.1)

## 2021-06-28 LAB — CBC WITH DIFFERENTIAL/PLATELET
Abs Immature Granulocytes: 0.02 10*3/uL (ref 0.00–0.07)
Basophils Absolute: 0.1 10*3/uL (ref 0.0–0.1)
Basophils Relative: 1 %
Eosinophils Absolute: 0.1 10*3/uL (ref 0.0–0.5)
Eosinophils Relative: 2 %
HCT: 54.5 % — ABNORMAL HIGH (ref 39.0–52.0)
Hemoglobin: 17.3 g/dL — ABNORMAL HIGH (ref 13.0–17.0)
Immature Granulocytes: 0 %
Lymphocytes Relative: 14 %
Lymphs Abs: 0.7 10*3/uL (ref 0.7–4.0)
MCH: 27.9 pg (ref 26.0–34.0)
MCHC: 31.7 g/dL (ref 30.0–36.0)
MCV: 87.8 fL (ref 80.0–100.0)
Monocytes Absolute: 0.6 10*3/uL (ref 0.1–1.0)
Monocytes Relative: 12 %
Neutro Abs: 3.4 10*3/uL (ref 1.7–7.7)
Neutrophils Relative %: 71 %
Platelets: 72 10*3/uL — ABNORMAL LOW (ref 150–400)
RBC: 6.21 MIL/uL — ABNORMAL HIGH (ref 4.22–5.81)
RDW: 19.7 % — ABNORMAL HIGH (ref 11.5–15.5)
WBC: 4.9 10*3/uL (ref 4.0–10.5)
nRBC: 0 % (ref 0.0–0.2)

## 2021-06-28 MED ORDER — CLONIDINE HCL 0.1 MG PO TABS
0.1000 mg | ORAL_TABLET | Freq: Four times a day (QID) | ORAL | Status: DC | PRN
  Filled 2021-06-28: qty 1

## 2021-06-28 NOTE — Evaluation (Signed)
Physical Therapy Assessment and Plan  Patient Details  Name: Carlos Welch MRN: [id number] Date of Birth: Jun 03, 1946  PT Diagnosis: Dizziness and giddiness, Hemiparesis dominant, Impaired cognition, Impaired sensation, and Muscle weakness Rehab Potential: Fair ELOS: 2.5 - 3.5 weeks   Today's Date: 06/28/2021 PT Individual Time: 1740-8144 and 8185-6314 PT Individual Time Calculation (min): 56 min and 15 min  Hospital Problem: Principal Problem:   ICH (intracerebral hemorrhage) ([place])   Past Medical History:  Past Medical History:  Diagnosis Date   A-fib ([place])    Abscess of jaw 07/19/2020   Acute on chronic heart failure with preserved ejection fraction ([place]) 07/08/2020   Aortic stenosis    a. s/p pericardial AVR 2015.   Aortic valve stenosis 07/19/2020   Bacteremia 07/19/2020   Bacterial endocarditis    BPH (benign prostatic hyperplasia) 07/08/2020   CAD (coronary artery disease)    a. s/p CABG, AVR, LAA clipping 2015 at [facility]   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/08/2020   Elevated troponin 07/08/2020   Essential hypertension 07/08/2020   Generalized abdominal pain    History of noncompliance with medical treatment    Hyperlipemia 07/08/2020   Infection associated with implant ([place]) 07/19/2020   Malnutrition ([place])    MRSA bacteremia    Nonrheumatic mitral valve regurgitation 07/19/2020   Normocytic anemia 07/08/2020   Pressure injury of skin 05/12/2020   S/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 2015   Severe malnutrition ([place]) 07/19/2020   Thrombocytopenia ([place]) 07/08/2020   TIA (transient  ischemic attack)    Weight loss 07/19/2020   Past Surgical History:  Past Surgical History:  Procedure Laterality Date   AORTIC VALVE REPLACEMENT     CORONARY ARTERY BYPASS GRAFT  2015   HERNIA REPAIR     REMOVAL OF IMPLANT  03/14/2020   TEE WITHOUT CARDIOVERSION N/A 05/13/2020   Procedure: TRANSESOPHAGEAL ECHOCARDIOGRAM (TEE);  Surgeon: Lelon Perla, MD;  [facility] ENDOSCOPY;  Service: Cardiovascular;  Laterality: N/A;    Assessment & Plan Clinical Impression: Patient is a 75 y.o. right-handed male with history of atrial fibrillation maintained on Eliquis, aortic stenosis status post aortic valve replacement, CAD status post CABG, bilateral lower extremity cellulitis, combined CHF, hypertension, hyperlipidemia, thrombocytopenia, remote tobacco abuse.  Per chart review patient lives with his daughter and family.  1 level home.  Walks with a cane.  Reports dressing and bathing himself prior to admission independently.  He has good family support.  Presented 06/23/2021 with acute onset of right-sided weakness and slurred speech.  Cranial CT scan showed acute hemorrhage centered in the left thalamus with an estimated volume of 9 mL.  No intraventricular or extra axial extension of blood.  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  His chronic Eliquis was discontinued due to hematoma.  CT angiogram head and neck negative for LVO or intracranial hemorrhage.  Positive for bilateral cervical ICA pseudoaneurysms, 13 x 20 mm on the right and 9 x 9 mm on the left.  Patient did not receive tPA due to hematoma.  MRI of the brain follow-up showed stable size of intraparenchymal hematoma centered in  the left thalamus with surrounding vasogenic edema and regional mass-effect.  Multi foci of susceptibility artifact scattered throughout the supratentorial infratentorial compartment suggesting multiple prior micro bleeds raising suspicion for amyloid angiopathy.  Admission  chemistries unremarkable except BUN 27.  Echocardiogram with ejection fraction of 60 to 65% no wall motion abnormalities.  Neurology follow-up close monitoring of blood pressure.  Maintain on a dysphagia #2 nectar thick liquid diet.  Therapy evaluations completed due to patient decreased functional mobility was admitted for a comprehensive rehab program.  Patient transferred to CIR on 06/27/2021 .   Patient currently requires total/unknown LOA with mobility secondary to muscle weakness, decreased cardiorespiratoy endurance, impaired timing and sequencing, unbalanced muscle activation, and decreased coordination, ,, decreased midline orientation, decreased attention to right, and decreased motor planning, decreased initiation, decreased attention, decreased awareness, decreased safety awareness, decreased memory, and delayed processing, and decreased sitting balance, decreased standing balance, decreased postural control, decreased balance strategies, and hemipareisis .  Prior to hospitalization, patient was modified independent  with mobility and lived with Daughter, Other (Comment) (grandson) in a House home.  Home access is  Level entry.  Patient will benefit from skilled PT intervention to maximize safe functional mobility, minimize fall risk, and decrease caregiver burden for planned discharge home with 24 hour assist.  Anticipate patient will benefit from follow up [facility] at discharge.  PT - End of Session Activity Tolerance: Tolerates < 10 min activity with changes in vital signs Endurance Deficit: Yes Endurance Deficit Description: reports lightheadedness and nausea with mobility, BP following mobility after 58mn rest = 148/107 PT Assessment Rehab Potential (ACUTE/IP ONLY): Fair PT Barriers to Discharge: [place];Decreased caregiver support;Home environment access/layout;Incontinence;Lack of/limited family support;Insurance for SNF coverage;Weight;Behavior;Nutrition means PT  Patient demonstrates impairments in the following area(s): Balance;Behavior;Endurance;Motor;Nutrition;Pain;Perception;Safety;Sensory;Skin Integrity PT Transfers Functional Problem(s): Bed Mobility;Bed to Chair;Car;Furniture PT Locomotion Functional Problem(s): Ambulation;Wheelchair Mobility;Stairs PT Plan PT Intensity: Minimum of 1-2 x/day ,45 to 90 minutes PT Frequency: 5 out of 7 days PT Duration Estimated Length of Stay: 2.5 - 3.5 weeks PT Treatment/Interventions: Ambulation/gait training;Balance/vestibular training;Cognitive remediation/compensation;Community reintegration;Discharge planning;Disease management/prevention;DME/adaptive equipment instruction;Functional mobility training;Neuromuscular re-education;Pain management;Patient/family education;Psychosocial support;Skin care/wound management;Splinting/orthotics;Stair training;Therapeutic Activities;Therapeutic Exercise;UE/LE Strength taining/ROM;UE/LE Coordination activities;Visual/perceptual remediation/compensation;Wheelchair propulsion/positioning PT Transfers Anticipated Outcome(s): CGA/ min A PT Locomotion Anticipated Outcome(s): Min A with LRAD PT Recommendation Recommendations for Other Services: Neuropsych consult;Therapeutic Recreation consult Therapeutic Recreation Interventions: Stress management;Outing/community reintergration Follow Up Recommendations: Home health PT Patient destination: Home Equipment Recommended: To be determined Equipment Details: TBD based on progress   PT Evaluation Precautions/Restrictions Precautions Precautions: Fall Precaution Comments: R hemi, R inattention, decr R sensation, small subluxation of R shoulder, fluctuating agitation, hypertension, lightheadedness/ blurry vision/ nausea with change in position Restrictions Weight Bearing Restrictions: No General   Vital SignsTherapy Vitals Temp: 97.6 F (36.4 C) Temp Source: Oral Pulse Rate: (!) 57 Resp: 17 BP: (!) 143/99 Patient  Position (if appropriate): Lying Oxygen Therapy SpO2: 99 % O2 Device: Room Air Pain Pain Assessment Pain Scale: Faces Faces Pain Scale: Hurts even more Pain Type: Acute pain Pain Location: Head Pain Descriptors / Indicators: Aching;Discomfort;Moaning;Grimacing (pain causing nausea) Pain Onset: On-going Patients Stated Pain Goal: 0 Pain Intervention(s): Pain med given for lower pain score than stated, per patient request;Repositioned Multiple Pain Sites: No Pain Interference Pain Interference Pain Effect on Sleep: 1. Rarely or not at all Pain Interference with Therapy Activities: 1. Rarely or not at all Pain Interference with Day-to-Day Activities: 1. Rarely or not at all HTaosAvailable Help  at Discharge: Family;Available 24 hours/day Type of Home: House Home Access: Level entry Home Layout: One level Bathroom Shower/Tub: Tub/shower unit;Walk-in shower Bathroom Toilet: Handicapped height Bathroom Accessibility: Yes Additional Comments: information from prior admission and from patient report  Lives With: Daughter;Other (Comment) (grandson) Prior Function Level of Independence: Requires assistive device for independence [facility]) Vision/Perception  Vision - History Ability to See in Adequate Light: 2 Moderately impaired Vision - Assessment Tracking/Visual Pursuits: Impaired - to be further tested in functional context Diplopia Assessment: Present all the time/all directions Additional Comments: Unable to read large print on wall, large print off paper, did not have readers donned Perception Perception: Impaired Inattention/Neglect: Does not attend to right side of body;Does not attend to right visual field Praxis Praxis: Intact  Cognition Overall Cognitive Status: No family/caregiver present to determine baseline cognitive functioning Arousal/Alertness: Lethargic Orientation Level: Oriented to person;Oriented to place Year: 2022 Month:  October Day of Week: Correct Attention: Focused;Sustained Focused Attention: Impaired Focused Attention Impairment: Verbal basic Sustained Attention: Impaired Sustained Attention Impairment: Verbal basic Memory: Impaired Memory Impairment: Decreased recall of new information Immediate Memory Recall: Sock;Blue;Bed Memory Recall Sock: Not able to recall Memory Recall Blue: With Cue Memory Recall Bed: Not able to recall Awareness: Impaired Awareness Impairment: Intellectual impairment;Emergent impairment Problem Solving: Impaired Problem Solving Impairment: Functional basic Executive Function: Self Monitoring;Self Correcting Behaviors: Verbal agitation;Impulsive;Poor frustration tolerance Safety/Judgment: Impaired Comments: difficult to fully assess 2/2 lethargy/fluctuating agitation Sensation Sensation Light Touch: Impaired by gross assessment Hot/Cold: Not tested Proprioception: Impaired by gross assessment Stereognosis: Impaired by gross assessment Additional Comments: R hemi, unable to identify LT to RUE/RLE Coordination Gross Motor Movements are Fluid and Coordinated: No Fine Motor Movements are Fluid and Coordinated: No Coordination and Movement Description: R hemi RUE>RLE Finger Nose Finger Test: unable to complete on R Heel Shin Test: unable to complete 2/2 agitation and lightheadedness Motor  Motor Motor: Abnormal postural alignment and control;Other (comment) Motor - Skilled Clinical Observations: R hemipareisis with RUE more affected than RLE   Trunk/Postural Assessment  Cervical Assessment Cervical Assessment: Exceptions to [facility] (Forward head) Thoracic Assessment Thoracic Assessment: Exceptions to [facility] (Rounded shoulders) Lumbar Assessment Lumbar Assessment: Exceptions to [facility] (Post pelvic tilt) Postural Control Postural Control: Deficits on evaluation (unable to resist perturbations in seated position, but able to maintain upright posture while seated EOB with  BUE support)  Balance Balance Balance Assessed: Yes Static Sitting Balance Static Sitting - Balance Support: Feet supported;Bilateral upper extremity supported Static Sitting - Level of Assistance: 5: Stand by assistance;4: Min assist Dynamic Sitting Balance Dynamic Sitting - Balance Support: During functional activity;Bilateral upper extremity supported Dynamic Sitting - Level of Assistance: 4: Min assist Dynamic Sitting - Balance Activities: Reaching across midline;Forward lean/weight shifting;Lateral lean/weight shifting Sitting balance - Comments: CGA but req max A to recover from posterior LOB Static Standing Balance Static Standing - Balance Support: No upper extremity supported;During functional activity Static Standing - Level of Assistance: 1: +2 Total assist Extremity Assessment  RUE Assessment RUE Assessment: Exceptions to [facility] Active Range of Motion (AROM) Comments: <90 degrees shoulder flexion RUE Body System: Neuro Brunstrum levels for arm and hand: Arm;Hand Brunstrum level for arm: Stage II Synergy is developing Brunstrum level for hand: Stage II Synergy is developing LUE Assessment LUE Assessment: Within Functional Limits (grossly WFL for ADL) RLE Assessment RLE Assessment: Not tested General Strength Comments: Not formally tested but demonstrated during bed mobility at grossly 2+/ 5 LLE Assessment LLE Assessment: Not tested General Strength Comments: Not formally tested  but demonstrated during bed mobility at grossly 3/ 5  Care Tool Care Tool Bed Mobility Roll left and right activity   Roll left and right assist level: Moderate Assistance - Patient 50 - 74%    Sit to lying activity   Sit to lying assist level: Minimal Assistance - Patient > 75%    Lying to sitting on side of bed activity   Lying to sitting on side of bed assist level: the ability to move from lying on the back to sitting on the side of the bed with no back support.: Minimal Assistance -  Patient > 75%     Care Tool Transfers Sit to stand transfer Sit to stand activity did not occur: Safety/medical concerns      Chair/bed transfer Chair/bed transfer activity did not occur: Safety/medical concerns       Toilet transfer Toilet transfer activity did not occur: Safety/medical concerns      Scientist, product/process development transfer activity did not occur: Safety/medical concerns        Care Tool Locomotion Ambulation Ambulation activity did not occur: Safety/medical concerns        Walk 10 feet activity Walk 10 feet activity did not occur: Safety/medical concerns       Walk 50 feet with 2 turns activity Walk 50 feet with 2 turns activity did not occur: Safety/medical concerns      Walk 150 feet activity Walk 150 feet activity did not occur: Safety/medical concerns      Walk 10 feet on uneven surfaces activity Walk 10 feet on uneven surfaces activity did not occur: Safety/medical concerns      Stairs Stair activity did not occur: Safety/medical concerns        Walk up/down 1 step activity Walk up/down 1 step or curb (drop down) activity did not occur: Safety/medical concerns     Walk up/down 4 steps activity did not occuR: Safety/medical concerns  Walk up/down 4 steps activity      Walk up/down 12 steps activity Walk up/down 12 steps activity did not occur: Safety/medical concerns      Pick up small objects from floor Pick up small object from the floor (from standing position) activity did not occur: Safety/medical concerns      Wheelchair Is the patient using a wheelchair?: No Type of Wheelchair: Manual Wheelchair activity did not occur: Safety/medical concerns      Wheel 50 feet with 2 turns activity Wheelchair 50 feet with 2 turns activity did not occur: Safety/medical concerns    Wheel 150 feet activity Wheelchair 150 feet activity did not occur: Safety/medical concerns      Refer to Care Plan for Long Term Goals  SHORT TERM GOAL WEEK 1 PT Short Term  Goal 1 (Week 1): Pt will perform all bed mobility with consistent CGA. PT Short Term Goal 2 (Week 1): Pt will perform dynamic sitting balance tasks with Min A. PT Short Term Goal 3 (Week 1): Pt will perform sit <> stand with Mod A. PT Short Term Goal 4 (Week 1): Pt will initiate gait training with LRAD. PT Short Term Goal 5 (Week 1): Pt will decrease complaint of lightheadedness/ dizziness with mobility by 50%.  Recommendations for other services: Neuropsych and Therapeutic Recreation  Stress management and Outing/community reintegration  Skilled Therapeutic Intervention Mobility Bed Mobility Bed Mobility: Supine to Sit;Sit to Supine Supine to Sit: Minimal Assistance - Patient > 75% Sit to Supine: Minimal Assistance - Patient > 75%;Moderate Assistance - Patient 50-74%;2 Helpers  Transfers Transfers: Sit to Stand;Stand to Sit;Transfer via [place] to Stand: 2 Helpers Stand to Sit: 2 Press photographer (Assistive device): None Transfer via Lift Equipment: Animal nutritionist: No Gait Gait: No Product manager Mobility: No  PT Evaluation completed; see above for results. PT educated patient in roles of PT vs OT, PT POC, rehab potential, rehab goals, and discharge recommendations along with recommendation for follow-up rehabilitation services. Individual treatment initiated:  Patient supine and asleep in bed upon PT arrival. Patient takes time and effort to fully rouse and then is agreeable to PT session. BLE compression sleeves removed and pt unable to follow instructions to move BLE for assist. Upon moving his legs, he becomes agitated and then is able to demonstrate ability to produce SLR in LLE and very light lift of RLE to assist. Bed moved away from window to allow for roll to L and use of L side to assist with bed mobility. With max encouragement through his demonstrated fatigue, pt does start to move toward supine--> sit EOB to L side and is able  to move LLE out of bed with vc/tc, bring UB up to seated position with pull of LUE at bed rail and noted repositioning and push with RUE against bed surface. Is mostly turned to EOB but requires Min A to bring RLE around so that both feet are on floor.   Once seated EOB he is able to maintain balance with no UE support and no LOB noted with reach to therapist hand. With increased time in seated position, pt c/o increasing headache, blurry ("smeared") and double vision as well as nausea. Unable to provide numerical score. Rodena Goldmann assessed at 6-7/ 10.  Within 79mnutes pt is requesting to lie back down. Is able to return to supine with Min A for BLE and Total A +2 for position toward HOB. Pt maintains eyes shut. Noted glasses with tape over L eye lens and pt states he "hates those" and does not want to put them on as they "don't help". Noted eye patch in chart but one not easily found. BP taken once pt at rest and BP noted at 148/ 107 after 2-3 min in supine. Again after 173m with new reading at 140/ 103.   Pt unwilling to get up again this session d/t symptom increase with change in position. Discussion with RN and PA re: medications for BP. PA recommending slow change in medication for slow decrease in BP for safe, optimal change. Agreement to continue to see pt for positioning changes throughout to assess symptoms along with medication adjustments and potential improvements in BP until symptom relief allows for improved participation in therapies.   Patient supine in bed at end of session with brakes locked, bed alarm set, pillow prop to RUE, and all needs within reach. Compression sleeves on BLE applied and started.   Discharge Criteria: Patient will be discharged from PT if patient refuses treatment 3 consecutive times without medical reason, if treatment goals not met, if there is a change in medical status, if patient makes no progress towards goals or if patient is discharged from hospital.  The  above assessment, treatment plan, treatment alternatives and goals were discussed and mutually agreed upon: by patient  Carlos Welch, DPT 06/28/2021, 6:07 PM

## 2021-06-28 NOTE — Progress Notes (Signed)
Pt decided to take pain med

## 2021-06-28 NOTE — Progress Notes (Signed)
Inpatient Rehabilitation Center Individual Statement of Services  Patient Name:  Carlos Welch  Date:  06/28/2021  Welcome to the Inpatient Rehabilitation Center.  Our goal is to provide you with an individualized program based on your diagnosis and situation, designed to meet your specific needs.  With this comprehensive rehabilitation program, you will be expected to participate in at least 3 hours of rehabilitation therapies Monday-Friday, with modified therapy programming on the weekends.  Your rehabilitation program will include the following services:  Physical Therapy (PT), Occupational Therapy (OT), Speech Therapy (ST), 24 hour per day rehabilitation nursing, Therapeutic Recreaction (TR), Neuropsychology, Care Coordinator, Rehabilitation Medicine, Nutrition Services, Pharmacy Services, and Other  Weekly team conferences will be held on Wednesdays to discuss your progress.  Your Inpatient Rehabilitation Care Coordinator will talk with you frequently to get your input and to update you on team discussions.  Team conferences with you and your family in attendance may also be held.  Expected length of stay:  14-16 Days  Overall anticipated outcome:  Supervision to Min A  Depending on your progress and recovery, your program may change. Your Inpatient Rehabilitation Care Coordinator will coordinate services and will keep you informed of any changes. Your Inpatient Rehabilitation Care Coordinator's name and contact numbers are listed  below.  The following services may also be recommended but are not provided by the Inpatient Rehabilitation Center:   Home Health Rehabiltiation Services Outpatient Rehabilitation Services    Arrangements will be made to provide these services after discharge if needed.  Arrangements include referral to agencies that provide these services.  Your insurance has been verified to be:  Medicare A and B Your primary doctor is:  Biochemist, clinical, [name], PA-C  Pertinent  information will be shared with your doctor and your insurance company.  Inpatient Rehabilitation Care Coordinator:  Lavera Guise, [place] [phone] or [phone]  Information discussed with and copy given to patient by: [name], 06/28/2021, 11:04 AM

## 2021-06-28 NOTE — Evaluation (Signed)
Occupational Therapy Assessment and Plan  Patient Details  Name: Carlos Welch MRN: [id number] Date of Birth: [birth year]  OT Diagnosis: abnormal posture, acute pain, altered mental status, ataxia, cognitive deficits, disturbance of vision, hemiplegia affecting dominant side, muscle weakness (generalized), and decreased postural control Rehab Potential: Rehab Potential (ACUTE ONLY): Fair ELOS: 3 to 3.5 weeks   Today's Date: 06/28/2021 OT Individual Time: 0947-0962 OT Individual Time Calculation (min): 54 min     Hospital Problem: Principal Problem:   ICH (intracerebral hemorrhage) ([place])   Past Medical History:  Past Medical History:  Diagnosis Date   A-fib ([place])    Abscess of jaw 07/19/2020   Acute on chronic heart failure with preserved ejection fraction ([place]) 07/08/2020   Aortic stenosis    a. s/p pericardial AVR 2015.   Aortic valve stenosis 07/19/2020   Bacteremia 07/19/2020   Bacterial endocarditis    BPH (benign prostatic hyperplasia) 07/08/2020   CAD (coronary artery disease)    a. s/p CABG, AVR, LAA clipping 2015 at [facility].   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/08/2020   Elevated troponin 07/08/2020   Essential hypertension 07/08/2020   Generalized abdominal pain    History of noncompliance with medical treatment    Hyperlipemia 07/08/2020   Infection associated with implant ([place]) 07/19/2020   Malnutrition ([place])    MRSA bacteremia    Nonrheumatic mitral valve regurgitation 07/19/2020   Normocytic anemia 07/08/2020   Pressure injury of skin 05/12/2020   S/P aortic valve replacement with bioprosthetic  valve 2015   Severe malnutrition ([place]) 07/19/2020   Thrombocytopenia ([place]) 07/08/2020   TIA (transient ischemic attack)    Weight loss 07/19/2020   Past Surgical History:  Past Surgical History:  Procedure Laterality Date   AORTIC VALVE REPLACEMENT     CORONARY ARTERY BYPASS GRAFT  2015   HERNIA REPAIR     REMOVAL OF IMPLANT  03/14/2020   TEE WITHOUT CARDIOVERSION N/A 05/13/2020   Procedure: TRANSESOPHAGEAL ECHOCARDIOGRAM (TEE);  Surgeon: Lelon Perla, MD;  Location: [facility] ENDOSCOPY;  Service: Cardiovascular;  Laterality: N/A;    Assessment & Plan Clinical Impression: Patient is a 75 y.o. year old male with history of atrial fibrillation maintained on Eliquis, aortic stenosis status post aortic valve replacement, CAD status post CABG, bilateral lower extremity cellulitis, combined CHF, hypertension, hyperlipidemia, thrombocytopenia, remote tobacco abuse.  Per chart review patient lives with his daughter and family.  1 level home.  Walks with a cane.  Reports dressing and bathing himself prior to admission independently.  He has good family support.  Presented 06/23/2021 with acute onset of right-sided weakness and slurred speech.  Cranial CT scan showed acute hemorrhage centered in the left thalamus with an estimated volume of 9 mL.  No intraventricular or extra axial extension of blood.  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  His chronic Eliquis was discontinued due to hematoma.  CT angiogram head and neck negative for LVO or intracranial hemorrhage.  Positive for bilateral cervical ICA pseudoaneurysms, 13 x 20 mm on the right and 9 x 9 mm on the left.  Patient did not receive tPA due to  hematoma.  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  Multi foci of susceptibility artifact scattered throughout the supratentorial infratentorial  compartment suggesting multiple prior micro bleeds raising suspicion for amyloid angiopathy.  Admission chemistries unremarkable except BUN 27.  Echocardiogram with ejection fraction of 60 to 65% no wall motion abnormalities.  Neurology follow-up close monitoring of blood pressure.  Maintain on a dysphagia #2 nectar thick liquid diet.  Therapy evaluations completed due to patient decreased functional mobility was admitted for a comprehensive rehab programPatient transferred to CIR on 06/27/2021 .    Patient currently requires  total A +2  with basic self-care skills secondary to muscle weakness, decreased cardiorespiratoy endurance, impaired timing and sequencing, abnormal tone, unbalanced muscle activation, motor apraxia, ataxia, decreased coordination, and decreased motor planning, decreased visual acuity, decreased visual perceptual skills, and diplopia and blurriness of vision, decreased attention to right and decreased motor planning, decreased attention, decreased awareness, decreased problem solving, decreased safety awareness, and decreased memory, and decreased sitting balance, decreased standing balance, decreased postural control, hemiplegia, and decreased balance strategies.  Prior to hospitalization, patient could complete BADL with modified independent .  Patient will benefit from skilled intervention to decrease level of assist with basic self-care skills and increase independence with basic self-care skills prior to discharge home with care partner.  Anticipate patient will require 24 hour supervision and follow up home health.  OT - End of Session Activity Tolerance: Tolerates 10 - 20 min activity with multiple rests Endurance Deficit: Yes Endurance Deficit Description: reports dizziness with mobility, BP at 150/110 OT Assessment Rehab Potential (ACUTE ONLY): Fair OT Barriers to Discharge: Behavior OT Patient demonstrates impairments in the following area(s): Balance;Motor;Sensory;Skin  Integrity;Pain;Vision;Perception;Safety;Endurance;Cognition;Behavior OT Basic ADL's Functional Problem(s): Eating;Grooming;Bathing;Dressing;Toileting OT Transfers Functional Problem(s): Toilet;Tub/Shower OT Additional Impairment(s): Fuctional Use of Upper Extremity OT Plan OT Intensity: Minimum of 1-2 x/day, 45 to 90 minutes OT Frequency: 5 out of 7 days OT Duration/Estimated Length of Stay: 3 to 3.5 weeks OT Treatment/Interventions: Balance/vestibular training;Community reintegration;Disease mangement/prevention;Functional electrical stimulation;Neuromuscular re-education;Patient/family education;Self Care/advanced ADL retraining;Splinting/orthotics;Therapeutic Exercise;UE/LE Coordination activities;Wheelchair propulsion/positioning;Visual/perceptual remediation/compensation;UE/LE Strength taining/ROM;Therapeutic Activities;Skin care/wound managment;Psychosocial support;Pain management;Functional mobility training;DME/adaptive equipment instruction;Discharge planning;Cognitive remediation/compensation OT Self Feeding Anticipated Outcome(s): S OT Basic Self-Care Anticipated Outcome(s): min A OT Toileting Anticipated Outcome(s): min A OT Bathroom Transfers Anticipated Outcome(s): min A OT Recommendation Recommendations for Other Services: Neuropsych consult Patient destination: Home Follow Up Recommendations: Home health OT;24 hour supervision/assistance Equipment Recommended: To be determined   OT Evaluation Precautions/Restrictions  Precautions Precautions: Fall;Other (comment) Precaution Comments: R inattention, decr R sensation, small subluxation of R shoulder, fluctuating agitation Restrictions Weight Bearing Restrictions: No General Chart Reviewed: Yes Family/Caregiver Present: No Pain Pain Assessment Pain Scale: Faces Pain Score: 8  Faces Pain Scale: Hurts even more Pain Type: Acute pain Pain Location: Leg Pain Orientation: Left Home Living/Prior Functioning Home  Living Family/patient expects to be discharged to:: Unsure (unknown) Living Arrangements: Children, Other relatives Available Help at Discharge: Family, Available 24 hours/day Type of Home: House Home Access: Level entry Home Layout: One level Bathroom Shower/Tub: Tub/shower unit, Multimedia programmer: Handicapped height Bathroom Accessibility: Yes Additional Comments: information from prior admission and from patient report  Lives With: Daughter, Other (Comment) (grandson) IADL History Education: Production designer, theatre/television/film Occupation: Retired Prior Function Level of Independence: Requires assistive device for independence (cane) Vision Baseline Vision/History: 1 Wears glasses (readers) Ability to See in Adequate Light: 2 Moderately impaired Patient  Visual Report: Diplopia;Blurring of vision Vision Assessment?: Vision impaired- to be further tested in functional context (difficult to fully assess 2/2 lethargy/fluctuating agitation) Additional Comments: Unable to read large print on wall, large print off paper, did not have readers donned Perception  Perception: Impaired Inattention/Neglect: Does not attend to right side of body;Does not attend to right visual field Praxis Praxis: Intact Cognition Overall Cognitive Status: Impaired/Different from baseline Arousal/Alertness: Awake/alert Orientation Level: Person Year: 2022 Month: October Day of Week: Correct Memory: Impaired Immediate Memory Recall: Sock;Blue;Bed Memory Recall Sock: Not able to recall Memory Recall Blue: With Cue Memory Recall Bed: Not able to recall Problem Solving: Impaired Behaviors: Verbal agitation;Poor frustration tolerance;Impulsive Safety/Judgment: Impaired Comments: difficult to fully assess 2/2 lethargy/fluctuating agitation Sensation Sensation Light Touch: Impaired by gross assessment Hot/Cold: Not tested Proprioception: Impaired by gross assessment Stereognosis: Impaired by gross  assessment Additional Comments: R hemi, unable to identify LT to RUE/RLE Coordination Gross Motor Movements are Fluid and Coordinated: No Fine Motor Movements are Fluid and Coordinated: No Coordination and Movement Description: R hemi RUE>RLE Finger Nose Finger Test: unable to complete on R Motor  Motor Motor: Hemiplegia;Abnormal postural alignment and control Motor - Skilled Clinical Observations: R hemi RUE>RLE  Trunk/Postural Assessment  Cervical Assessment Cervical Assessment: Exceptions to [facility] (forward head) Thoracic Assessment Thoracic Assessment: Exceptions to [facility] (kyphosis) Lumbar Assessment Lumbar Assessment: Exceptions to [facility] (post pelvic tilt in sitting) Postural Control Postural Control: Deficits on evaluation (freq post/R LOB seated EOB, R lean in standing)  Balance Balance Balance Assessed: Yes Static Sitting Balance Static Sitting - Balance Support: Feet supported Static Sitting - Level of Assistance: 5: Stand by assistance;2: Max assist Dynamic Sitting Balance Sitting balance - Comments: CGA but req max A to recover from posterior LOB Static Standing Balance Static Standing - Balance Support: No upper extremity supported;During functional activity Static Standing - Level of Assistance: 1: +2 Total assist Extremity/Trunk Assessment RUE Assessment RUE Assessment: Exceptions to [facility] Active Range of Motion (AROM) Comments: <90 degrees shoulder flexion RUE Body System: Neuro Brunstrum levels for arm and hand: Arm;Hand Brunstrum level for arm: Stage II Synergy is developing Brunstrum level for hand: Stage II Synergy is developing LUE Assessment LUE Assessment: Within Functional Limits (grossly WFL for ADL)  Care Tool Care Tool Self Care Eating   Eating Assist Level: Minimal Assistance - Patient > 75%    Oral Care    Oral Care Assist Level: Minimal Assistance - Patient > 75%    Bathing   Body parts bathed by patient: Right arm;Chest;Abdomen;Right upper  leg;Left upper leg Body parts bathed by helper: Left arm;Front perineal area;Buttocks;Right lower leg;Left lower leg   Assist Level: Total Assistance - Patient < 25%    Upper Body Dressing(including orthotics)   What is the patient wearing?: Pull over shirt   Assist Level: Maximal Assistance - Patient 25 - 49%    Lower Body Dressing (excluding footwear)   What is the patient wearing?: Incontinence brief;Pants Assist for lower body dressing: 2 Helpers    Putting on/Taking off footwear   What is the patient wearing?: Non-skid slipper socks Assist for footwear: Total Assistance - Patient < 25%       Care Tool Toileting Toileting activity   Assist for toileting: 2 Helpers     Care Tool Bed Mobility Roll left and right activity   Roll left and right assist level: Total Assistance - Patient < 25%    Sit to lying activity   Sit to lying assist level:  2 Helpers    Lying to sitting on side of bed activity   Lying to sitting on side of bed assist level: the ability to move from lying on the back to sitting on the side of the bed with no back support.: Maximal Assistance - Patient 25 - 49%     Care Tool Transfers Sit to stand transfer   Sit to stand assist level: 2 Helpers    Chair/bed transfer   Chair/bed transfer assist level: 2 Pension scheme manager transfer   Assist Level: 2 Helpers     Care Tool Cognition  Expression of Ideas and Wants Expression of Ideas and Wants: 2. Frequent difficulty - frequently exhibits difficulty with expressing needs and ideas  Understanding Verbal and Non-Verbal Content Understanding Verbal and Non-Verbal Content: 2. Sometimes understands - understands only basic conversations or simple, direct phrases. Frequently requires cues to understand   Memory/Recall Ability Memory/Recall Ability : Current season;That he or she is in a hospital/hospital unit   Refer to Care Plan for [place] 1 OT Short Term Goal 1 (Week 1):  Pt will don shirt with mod A. OT Short Term Goal 2 (Week 1): Pt will don pants with max A.+ LRAD. OT Short Term Goal 3 (Week 1): Pt will tolerate completing ADL routine 2/3 sessions with no more than min VCs to redirect agitation. OT Short Term Goal 4 (Week 1): Pt will maintain static sitting with CGA and no posterior LOB for >5 min in prep for seated ADL.  Recommendations for other services: Neuropsych   Skilled Therapeutic Intervention ADL ADL Eating: Minimal assistance Where Assessed-Eating: Bed level Grooming: Minimal assistance Where Assessed-Grooming: Edge of bed Upper Body Bathing: Moderate assistance Where Assessed-Upper Body Bathing: Edge of bed Lower Body Bathing: Moderate assistance Where Assessed-Lower Body Bathing: Bed level Upper Body Dressing: Maximal assistance Where Assessed-Upper Body Dressing: Edge of bed Lower Body Dressing: Dependent Where Assessed-Lower Body Dressing: Edge of bed;Bed level Toileting: Dependent Where Assessed-Toileting: Glass blower/designer: Other (comment);Dependent (+2 in stedy) Toilet Transfer Method: Other (comment) (stedy) Toilet Transfer Equipment: Raised toilet seat;Grab bars Tub/Shower Transfer: Not assessed Social research officer, government: Not assessed Mobility  Bed Mobility Bed Mobility: Supine to Sit;Sit to Supine Supine to Sit: Maximal Assistance - Patient - Patient 25-49% Sit to Supine: 2 Helpers Transfers Sit to Stand: 2 Helpers Stand to Sit: 2 Helpers  Session Note: Pt received semi-reclined in bed, c/o ongoing BLE pain, RN notified, agreeable to OT eval. Reviewed role of CIR OT, evaluation process, ADL/func mobility retraining, goals for therapy, and safety plan. Evaluation completed as documented above with session focus on func mobility, EOB bathing and dressing. Pt able to state full name and relates that he is in an hospital, but states incorrect one. Not oriented to situation, but able to state "my right side is numb, I got  nothing on this side." Pt came to sitting EOB with max A to lift trunk 2/2 posterior lean/freq LOB. Completed UBB with mod A to bathe LUE and for thoroughness. Donned shirt with max A to thread BUE. Pt reports he is light headed, BP read at 150/110 (107). Pt becoming suddenly agitated during nursing care as NT removed male external catheter 2/2 pain. Req immediately to lay down, but agreeable to don brief/pants first. Total A + 2 to don pants 2/2 agitation, pt yelling "hurry up, what the f*ck." Pt returned to supine with max A, but then requesting to toilet  urgently "I have to sh*t." Stedy transfer with max + 2 2/2 R lean > toilet 2/2 urgency/agitation. Total A for toileting tasks, no void. Pt c/o pain in BLE 2/2 stedy. Stedy > transfer back to bed with total A + 2. Completed LBD with total A + 2 at bed level and readjusted in bed, pt continuously agitated until bed settings to his liking. Pt noted to immediately close his eyes. Able to answer several more questions as he became less agitated as bed adjusted to his liking. Pt left with HOB at 30 degrees, call bell bell in reach, bed alarm engaged and 4 bed rails up for safety, all immediate needs met.  Discharge Criteria: Patient will be discharged from OT if patient refuses treatment 3 consecutive times without medical reason, if treatment goals not met, if there is a change in medical status, if patient makes no progress towards goals or if patient is discharged from hospital.  The above assessment, treatment plan, treatment alternatives and goals were discussed and mutually agreed upon: by patient  [name] MS, OTR/L  06/28/2021, 12:56 PM

## 2021-06-28 NOTE — Progress Notes (Signed)
Pt refused to take prn norco. This nurse attempted to give pain meds to patient due to complaints of pain 8/10 in left leg. Pt refused meds after this sn crushed pill and placed in applesauce. This nurse will waste med

## 2021-06-28 NOTE — Progress Notes (Signed)
Clarification of Anticoagulation Plan   Discussed with Neurology, plan to hold anticoagulation for atrial fibrillation until outpatient follow up with Neurology.     Alphia Moh, PharmD, BCPS, BCCP Clinical Pharmacist  Please check AMION for all [facility] phone numbers After 10:00 PM, call Main Pharmacy [phone]

## 2021-06-28 NOTE — Progress Notes (Deleted)
Patient ID: Carlos Welch, male   DOB: 10/19/1945, 75 y.o.   MRN: [id number] Team Conference Report to Patient/Family  Team Conference discussion was reviewed with the patient and caregiver, including goals, any changes in plan of care and target discharge date.  Patient and caregiver express understanding and are in agreement.  The patient has a target discharge date of  . Sw called pt daughter Carlos Welch). Introduced self, explained role and addressed. Pt lives with daughter and grandson. No additional questions or concerns, sw will continue to follow up [name] 06/28/2021, 1:19 PM

## 2021-06-28 NOTE — Progress Notes (Signed)
Inpatient Rehabilitation  Patient information reviewed and entered into eRehab system by Brantly Kalman M. Londen Bok, M.A., CCC/SLP, PPS Coordinator.  Information including medical coding, functional ability and quality indicators will be reviewed and updated through discharge.    

## 2021-06-28 NOTE — Plan of Care (Signed)
Problem: RH Balance Goal: LTG: Patient will maintain dynamic sitting balance (OT) Description: LTG:  Patient will maintain dynamic sitting balance with assistance during activities of daily living (OT) Flowsheets (Taken 06/28/2021 1240) LTG: Pt will maintain dynamic sitting balance during ADLs with: Supervision/Verbal cueing Goal: LTG Patient will maintain dynamic standing with ADLs (OT) Description: LTG:  Patient will maintain dynamic standing balance with assist during activities of daily living (OT)  Flowsheets (Taken 06/28/2021 1240) LTG: Pt will maintain dynamic standing balance during ADLs with: Minimal Assistance - Patient > 75%   Problem: Sit to Stand Goal: LTG:  Patient will perform sit to stand in prep for activites of daily living with assistance level (OT) Description: LTG:  Patient will perform sit to stand in prep for activites of daily living with assistance level (OT) Flowsheets (Taken 06/28/2021 1240) LTG: PT will perform sit to stand in prep for activites of daily living with assistance level: Minimal Assistance - Patient > 75%   Problem: RH Eating Goal: LTG Patient will perform eating w/assist, cues/equip (OT) Description: LTG: Patient will perform eating with assist, with/without cues using equipment (OT) Flowsheets (Taken 06/28/2021 1240) LTG: Pt will perform eating with assistance level of: Supervision/Verbal cueing   Problem: RH Grooming Goal: LTG Patient will perform grooming w/assist,cues/equip (OT) Description: LTG: Patient will perform grooming with assist, with/without cues using equipment (OT) Flowsheets (Taken 06/28/2021 1240) LTG: Pt will perform grooming with assistance level of: Supervision/Verbal cueing   Problem: RH Bathing Goal: LTG Patient will bathe all body parts with assist levels (OT) Description: LTG: Patient will bathe all body parts with assist levels (OT) Flowsheets (Taken 06/28/2021 1240) LTG: Pt will perform bathing with assistance  level/cueing: Minimal Assistance - Patient > 75%   Problem: RH Dressing Goal: LTG Patient will perform upper body dressing (OT) Description: LTG Patient will perform upper body dressing with assist, with/without cues (OT). Flowsheets (Taken 06/28/2021 1240) LTG: Pt will perform upper body dressing with assistance level of: Supervision/Verbal cueing Goal: LTG Patient will perform lower body dressing w/assist (OT) Description: LTG: Patient will perform lower body dressing with assist, with/without cues in positioning using equipment (OT) Flowsheets (Taken 06/28/2021 1240) LTG: Pt will perform lower body dressing with assistance level of: Minimal Assistance - Patient > 75%   Problem: RH Toileting Goal: LTG Patient will perform toileting task (3/3 steps) with assistance level (OT) Description: LTG: Patient will perform toileting task (3/3 steps) with assistance level (OT)  Flowsheets (Taken 06/28/2021 1240) LTG: Pt will perform toileting task (3/3 steps) with assistance level: Minimal Assistance - Patient > 75%   Problem: RH Vision Goal: RH LTG Vision Consulting civil engineer) Flowsheets (Taken 06/28/2021 1240) LTG: Vision Goals: Pt will utilize visual compensatory strategies (eye glasses tape, eye patch, etc) to reduce negative impact of diplopia on ADL performance with S.   Problem: RH Functional Use of Upper Extremity Goal: LTG Patient will use RT/LT upper extremity as a (OT) Description: LTG: Patient will use right/left upper extremity as a stabilizer/gross assist/diminished/nondominant/dominant level with assist, with/without cues during functional activity (OT) Flowsheets (Taken 06/28/2021 1240) LTG: Use of upper extremity in functional activities: RUE as diminished level LTG: Pt will use upper extremity in functional activity with assistance level of: Supervision/Verbal cueing   Problem: RH Toilet Transfers Goal: LTG Patient will perform toilet transfers w/assist (OT) Description: LTG: Patient will  perform toilet transfers with assist, with/without cues using equipment (OT) Flowsheets (Taken 06/28/2021 1240) LTG: Pt will perform toilet transfers with assistance level of: Minimal Assistance -  Patient > 75%   Problem: RH Tub/Shower Transfers Goal: LTG Patient will perform tub/shower transfers w/assist (OT) Description: LTG: Patient will perform tub/shower transfers with assist, with/without cues using equipment (OT) Flowsheets (Taken 06/28/2021 1240) LTG: Pt will perform tub/shower stall transfers with assistance level of: Minimal Assistance - Patient > 75%   Problem: RH Memory Goal: LTG Patient will demonstrate ability for day to day recall/carry over during activities of daily living with assistance level (OT) Description: LTG:  Patient will demonstrate ability for day to day recall/carry over during activities of daily living with assistance level (OT). Flowsheets (Taken 06/28/2021 1240) LTG:  Patient will demonstrate ability for day to day recall/carry over during activities of daily living with assistance level (OT): Supervision

## 2021-06-28 NOTE — Evaluation (Signed)
Speech Language Pathology Assessment and Plan  Patient Details  Name: Carlos Welch MRN: [id number] Date of Birth: 08-Nov-1945  SLP Diagnosis: Cognitive Impairments;Speech and Language deficits;Dysphagia  Rehab Potential: Good ELOS: 3-3.5 weeks   Today's Date: 06/28/2021 SLP Individual Time: 5176-1607 SLP Individual Time Calculation (min): 45 min and Today's Date: 06/28/2021 SLP Missed Time: 15 Minutes Missed Time Reason: Patient unwilling to participate;Other (Comment) (agitation)  Hospital Problem: Principal Problem:   ICH (intracerebral hemorrhage) (HCC)  Past Medical History:  Past Medical History:  Diagnosis Date   A-fib ([place])    Abscess of jaw 07/19/2020   Acute on chronic heart failure with preserved ejection fraction ([place]) 07/08/2020   Aortic stenosis    a. s/p pericardial AVR 2015.   Aortic valve stenosis 07/19/2020   Bacteremia 07/19/2020   Bacterial endocarditis    BPH (benign prostatic hyperplasia) 07/08/2020   CAD (coronary artery disease)    a. s/p CABG, AVR, LAA clipping 2015 at [facility].   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/08/2020   Elevated troponin 07/08/2020   Essential hypertension 07/08/2020   Generalized abdominal pain    History of noncompliance with medical treatment    Hyperlipemia 07/08/2020   Infection associated with implant ([place]) 07/19/2020   Malnutrition ([place])    MRSA bacteremia    Nonrheumatic mitral valve regurgitation 07/19/2020   Normocytic anemia 07/08/2020   Pressure injury of skin 05/12/2020   S/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 2015   Severe malnutrition  ([place]) 07/19/2020   Thrombocytopenia ([place]) 07/08/2020   TIA (transient ischemic attack)    Weight loss 07/19/2020   Past Surgical History:  Past Surgical History:  Procedure Laterality Date   AORTIC VALVE REPLACEMENT     CORONARY ARTERY BYPASS GRAFT  2015   HERNIA REPAIR     REMOVAL OF IMPLANT  03/14/2020   TEE WITHOUT CARDIOVERSION N/A 05/13/2020   Procedure: TRANSESOPHAGEAL ECHOCARDIOGRAM (TEE);  Surgeon: Lelon Perla, MD;  Location: [facility] ENDOSCOPY;  Service: Cardiovascular;  Laterality: N/A;   HPI: Carlos Welch is a 75 year old right-handed male with history of atrial fibrillation maintained on Eliquis, aortic stenosis status post aortic valve replacement, CAD status post CABG, bilateral lower extremity cellulitis, combined CHF, hypertension, hyperlipidemia, thrombocytopenia, remote tobacco abuse. Per chart review patient lives with his daughter and family. 1 level home. Walks with a cane. Reports dressing and bathing himself prior to admission independently. He has good family support. Presented 06/23/2021 with acute onset of right-sided weakness and slurred speech. Cranial CT scan showed acute hemorrhage centered in the left thalamus with an estimated volume of 9 mL. Maintain on a dysphagia #2 nectar thick liquid diet.   Assessment / Plan / Recommendation Clinical Impression  Patient was received in bed and initially agreeable to initiate SLP evaluation. Pt appeared lethargic and kept his eyes closed for majority of eval. He also appeared mildly frustrated but remained cooperative during initial 30 minutes. Assessment eventually limited by internal distractions to pain and requesting pain medication. Pt identified pain on number scale and locations (left leg and stomach) and was known to mix up right and left sides. He appeared frustrated when SLP  attempted to clarify which side. NSG was notified and arrived to provide meds where patient profusely refused because meds were crushed. He became  increasingly agitated and began to yell very loudly. After short delay of ~ 5 minutes he was eventually agreeable to taking medication when presented with sugar on top to minimize bitterness. Following this encounter, patient eventually refused to participate in further cognitive-linguistic assessment and bedside swallow evaluation. He also denied SLP's efforts to maximize comfort.   Per limited assessment, pt presents with significant cognitive-communication deficits characterized by decreased orientation to time/place/situation, focused attention, intellectual awareness, and insight into deficits. MBS on 06/24/21 revealed mild-to-mod oropharyngeal dysphagia with silent aspiration of thin liquids. Recommended a Dys 2 diet and nectar thick liquids and pills crushed. Patient refused to consume noon meal this date but nurse endorsed he ate "some" this morning. Motor speech was clear and intelligible with slightly reduced facial symmetry and strength on right side. Patient benefited from increased processing time for comprehension. Pt able to communicate functional needs although decreased response length noted and known to repeat same phrase when asked to elaborate. Further assessment needed for expressive/receptive language. Skilled SLP intervention is recommended to address functional deficits and to maximize safety and independence. Patient would also benefit from further cognitive-linguistic and swallow assessment if agreeable.   Skilled Therapeutic Interventions          SLP facilitated informal cognitive, speech, and language assessments. Please see above.   SLP Assessment  Patient will need skilled Speech Lanaguage Pathology Services during CIR admission    Recommendations  SLP Diet Recommendations: Dysphagia 2 (Fine chop);Nectar Liquid Administration via: Cup Medication Administration: Crushed with puree Supervision: Full supervision/cueing for compensatory strategies;Staff to assist with self  feeding Compensations: Lingual sweep for clearance of pocketing;Small sips/bites;Slow rate;Clear throat intermittently Postural Changes and/or Swallow Maneuvers: Seated upright 90 degrees Oral Care Recommendations: Oral care BID Recommendations for Other Services: Neuropsych consult Patient destination: Home Follow up Recommendations: 24 hour supervision/assistance (TBD) Equipment Recommended: None recommended by SLP    SLP Frequency 3 to 5 out of 7 days   SLP Duration  SLP Intensity  SLP Treatment/Interventions 3-3.5 weeks  Minumum of 1-2 x/day, 30 to 90 minutes  Cognitive remediation/compensation;Environmental controls;Internal/external aids;Other (comment);Functional tasks;Dysphagia/aspiration precaution training;Cueing hierarchy;Patient/family education (further cognitive and language assessment)    Pain Pain Assessment Pain Scale: Faces Faces Pain Scale: Hurts even more Pain Type: Acute pain Pain Location: Head Pain Descriptors / Indicators: Aching;Discomfort;Moaning;Grimacing (pain causing nausea) Pain Onset: On-going Patients Stated Pain Goal: 0 Pain Intervention(s): Pain med given for lower pain score than stated, per patient request;Repositioned Multiple Pain Sites: No  Prior Functioning Cognitive/Linguistic Baseline: Information not available Type of Home: House  Lives With: Daughter;Other (Comment) (grandson) Available Help at Discharge: Family;Available 24 hours/day Education: Graduate Degree  SLP Evaluation Cognition Overall Cognitive Status: No family/caregiver present to determine baseline cognitive functioning Arousal/Alertness: Awake/alert Orientation Level: Oriented to person;Disoriented to place Year: 2022 Month: October Day of Week: Correct Attention: Focused;Sustained Focused Attention: Impaired Focused Attention Impairment: Verbal basic Sustained Attention: Impaired Sustained Attention Impairment: Verbal basic Memory: Impaired Memory  Impairment: Decreased recall of new information Immediate Memory Recall: Sock;Blue;Bed Memory Recall Sock: Not able to recall Memory Recall Blue: With Cue Memory Recall Bed: Not able to recall Awareness Impairment: Intellectual impairment;Emergent impairment Problem Solving: Impaired Problem Solving Impairment: Functional basic Executive Function: Self Monitoring;Self Correcting Behaviors: Verbal agitation;Poor frustration tolerance;Impulsive Safety/Judgment: Impaired Comments: difficult to fully assess 2/2 lethargy/fluctuating agitation  Comprehension Auditory Comprehension Overall Auditory  Comprehension: Impaired Yes/No Questions: Within Functional Limits Commands: Impaired Expression Expression Primary Mode of Expression: Verbal Verbal Expression Overall Verbal Expression: Other (comment) (further assessment warranted) Written Expression Dominant Hand: Right Oral Motor Oral Motor/Sensory Function Overall Oral Motor/Sensory Function: Mild impairment Facial ROM: Reduced right;Suspected CN VII (facial) dysfunction Facial Symmetry: Abnormal symmetry right;Suspected CN VII (facial) dysfunction Facial Strength: Reduced right Facial Sensation: Within Functional Limits Lingual ROM: Within Functional Limits;Suspected CN XII (hypoglossal) dysfunction Lingual Symmetry: Within Functional Limits Lingual Strength: Within Functional Limits Velum: Within Functional Limits Mandible: Within Functional Limits Motor Speech Overall Motor Speech: Appears within functional limits for tasks assessed Resonance: Within functional limits Articulation: Within functional limitis Intelligibility: Intelligible Motor Planning: Witnin functional limits  Care Tool Care Tool Cognition Ability to hear (with hearing aid or hearing appliances if normally used Ability to hear (with hearing aid or hearing appliances if normally used): 1. Minimal difficulty - difficulty in some environments (e.g. when person  speaks softly or setting is noisy)   Expression of Ideas and Wants Expression of Ideas and Wants: 2. Frequent difficulty - frequently exhibits difficulty with expressing needs and ideas   Understanding Verbal and Non-Verbal Content Understanding Verbal and Non-Verbal Content: 2. Sometimes understands - understands only basic conversations or simple, direct phrases. Frequently requires cues to understand  Memory/Recall Ability Memory/Recall Ability : Current season;That he or she is in a hospital/hospital unit   PMSV Assessment  PMSV Trial Intelligibility: Intelligible  Bedside Swallowing Assessment General Date of Onset: 06/23/21 Previous Swallow Assessment: MBS on 06/24/21 Diet Prior to this Study: Dysphagia 2 (chopped);Nectar-thick liquids History of Recent Intubation: No Oral Cavity - Dentition: Dentures, top;Dentures, bottom Self-Feeding Abilities: Needs assist Vision: Functional for self-feeding Volitional Swallow: Able to elicit  Oral Care Assessment Does patient have any of the following "high(er) risk" factors?: None of the above Does patient have any of the following "at risk" factors?: Lips - dry, cracked Patient is AT RISK: Order set for Adult Oral Care Protocol initiated -  "At Risk Patients" option selected (see row information) Patient is LOW RISK: Follow universal precautions (see row information)   BSE Assessment Risk for Aspiration Impact on safety and function: Moderate aspiration risk Other Related Risk Factors: Lethargy;Cognitive impairment  Short Term Goals: Week 1: SLP Short Term Goal 1 (Week 1): Patient will participate in further cognitive and expressive/receptive language testing to determine functional needs. SLP Short Term Goal 2 (Week 1): Patient will orient x4 with min A verbal cues for use of external aids across 3 consecutive sessions SLP Short Term Goal 3 (Week 1): Patient will demonstrate sustained attention to functional tasks for 5-10 minute  intervals with min A verbal redirection. SLP Short Term Goal 4 (Week 1): Patient will complete mildly complex problem solving with mod A verbal cues SLP Short Term Goal 5 (Week 1): Patient will tolerate PO trials of Dys 3 textures with effective mastication and oral clearance prior to diet advancement with min A verbal cues to implement swallow strategies  Refer to Care Plan for Long Term Goals  Recommendations for other services: Neuropsych  Discharge Criteria: Patient will be discharged from SLP if patient refuses treatment 3 consecutive times without medical reason, if treatment goals not met, if there is a change in medical status, if patient makes no progress towards goals or if patient is discharged from hospital.  The above assessment, treatment plan, treatment alternatives and goals were discussed and mutually agreed upon: by patient  Patty Sermons 06/28/2021, 5:28 PM

## 2021-06-28 NOTE — Plan of Care (Signed)
  Problem: RH Swallowing Goal: LTG Patient will consume least restrictive diet using compensatory strategies with assistance (SLP) Description: LTG:  Patient will consume least restrictive diet using compensatory strategies with assistance (SLP) Flowsheets (Taken 06/28/2021 1732) LTG: Pt Patient will consume least restrictive diet using compensatory strategies with assistance of (SLP): Supervision Goal: LTG Patient will participate in dysphagia therapy to increase swallow function with assistance (SLP) Description: LTG:  Patient will participate in dysphagia therapy to increase swallow function with assistance (SLP) Flowsheets (Taken 06/28/2021 1732) LTG: Pt will participate in dysphagia therapy to increase swallow function with assistance of (SLP): Supervision Goal: LTG Pt will demonstrate functional change in swallow as evidenced by bedside/clinical objective assessment (SLP) Description: LTG: Patient will demonstrate functional change in swallow as evidenced by bedside/clinical objective assessment (SLP) Flowsheets (Taken 06/28/2021 1732) LTG: Patient will demonstrate functional change in swallow as evidenced by bedside/clinical objective assessment: Oropharyngeal swallow   Problem: RH Cognition - SLP Goal: RH LTG Patient will demonstrate orientation with cues Description:  LTG:  Patient will demonstrate orientation to person/place/time/situation with cues (SLP)   Flowsheets (Taken 06/28/2021 1732) LTG Patient will demonstrate orientation to:  Person  Place  Time  Situation LTG: Patient will demonstrate orientation using cueing (SLP): Supervision   Problem: RH Comprehension Communication Goal: LTG Patient will comprehend basic/complex auditory (SLP) Description: LTG: Patient will comprehend basic/complex auditory information with cues (SLP). Flowsheets (Taken 06/28/2021 1732) LTG: Patient will comprehend: Basic auditory information LTG: Patient will comprehend auditory information with  cueing (SLP): Supervision   Problem: RH Expression Communication Goal: LTG Patient will verbally express basic/complex needs(SLP) Description: LTG:  Patient will verbally express basic/complex needs, wants or ideas with cues  (SLP) Flowsheets (Taken 06/28/2021 1732) LTG: Patient will verbally express basic/complex needs, wants or ideas (SLP): Supervision   Problem: RH Problem Solving Goal: LTG Patient will demonstrate problem solving for (SLP) Description: LTG:  Patient will demonstrate problem solving for basic/complex daily situations with cues  (SLP) Flowsheets (Taken 06/28/2021 1732) LTG: Patient will demonstrate problem solving for (SLP): Basic daily situations LTG Patient will demonstrate problem solving for: Minimal Assistance - Patient > 75%   Problem: RH Attention Goal: LTG Patient will demonstrate this level of attention during functional activites (SLP) Description: LTG:  Patient will will demonstrate this level of attention during functional activites (SLP) Flowsheets (Taken 06/28/2021 1732) Patient will demonstrate during cognitive/linguistic activities the attention type of: Sustained Patient will demonstrate this level of attention during cognitive/linguistic activities in: Controlled LTG: Patient will demonstrate this level of attention during cognitive/linguistic activities with assistance of (SLP): Minimal Assistance - Patient > 75%

## 2021-06-28 NOTE — Progress Notes (Signed)
Inpatient Rehabilitation Care Coordinator Assessment and Plan Patient Details  Name: Carlos Welch MRN: [id number] Date of Birth: 01/28/1946  Today's Date: 06/28/2021  Hospital Problems: Principal Problem:   ICH (intracerebral hemorrhage) (HCC)  Past Medical History:  Past Medical History:  Diagnosis Date   A-fib (HCC)    Abscess of jaw 07/19/2020   Acute on chronic heart failure with preserved ejection fraction (HCC) 07/08/2020   Aortic stenosis    a. s/p pericardial AVR 2015.   Aortic valve stenosis 07/19/2020   Bacteremia 07/19/2020   Bacterial endocarditis    BPH (benign prostatic hyperplasia) 07/08/2020   CAD (coronary artery disease)    a. s/p CABG, AVR, LAA clipping 2015 at [facility].   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/08/2020   Elevated troponin 07/08/2020   Essential hypertension 07/08/2020   Generalized abdominal pain    History of noncompliance with medical treatment    Hyperlipemia 07/08/2020   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   Malnutrition (HCC)    MRSA bacteremia    Nonrheumatic mitral valve regurgitation 07/19/2020   Normocytic anemia 07/08/2020   Pressure injury of skin 05/12/2020   S/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 2015   Severe malnutrition (HCC) 07/19/2020   Thrombocytopenia (HCC) 07/08/2020   TIA (transient ischemic attack)    Weight loss 07/19/2020   Past Surgical History:  Past Surgical History:  Procedure Laterality Date   AORTIC VALVE REPLACEMENT     CORONARY ARTERY BYPASS GRAFT  2015   HERNIA REPAIR     REMOVAL OF IMPLANT  03/14/2020   TEE  WITHOUT CARDIOVERSION N/A 05/13/2020   Procedure: TRANSESOPHAGEAL ECHOCARDIOGRAM (TEE);  Surgeon: Lewayne Bunting, MD;  Location: [facility] ENDOSCOPY;  Service: Cardiovascular;  Laterality: N/A;   Social History:  reports that he quit smoking about 10 years ago. His smoking use included cigarettes and cigars. He started smoking about 58 years ago. He has a 141.00 pack-year smoking history. He has never used smokeless tobacco. He reports that he does not drink alcohol and does not use drugs.  Family / Support Systems Children: Furniture conservator/restorer (Daughter) Anticipated Caregiver: Daughter Teacher, English as a foreign language) and Grandson Ability/Limitations of Caregiver: none Caregiver Availability: 24/7 Family Dynamics: Primary support from daughter and grandson  Social History Preferred language: English Religion: [employer] - How often do you need to have someone help you when you read instructions, pamphlets, or other written material from your doctor or pharmacy?: Never Writes: Yes Legal History/Current Legal Issues: n/a Guardian/Conservator: Furniture conservator/restorer   Abuse/Neglect Abuse/Neglect Assessment Can Be Completed: Yes Physical Abuse: Denies Verbal Abuse: Denies Sexual Abuse: Denies Exploitation of patient/patient's resources: Denies Self-Neglect: Denies  Patient response to: Social Isolation - How often do you feel lonely or isolated from those around you?: Never  Emotional Status Recent Psychosocial Issues: n/a Psychiatric History: n/a Substance Abuse History: n/a  Patient / Family Perceptions, Expectations & Goals Pt/Family understanding of illness & functional limitations: yes Premorbid pt/family roles/activities: Patient previously independent and in the community about once a week Anticipated changes in roles/activities/participation: Pt daughter able to assist with roles and task Pt/family expectations/goals: Supervision-Min A  [employer]:  None Premorbid Home  Care/DME Agencies: Other (Comment) Nurse, mental health, [facility] [place], Agricultural consultant) Transportation available at discharge: Daughter able to transport Is the patient able to respond to transportation needs?: Yes In the past 12 months, has lack of transportation kept you from medical appointments or from getting medications?: No In the past 12 months, has lack of transportation kept you from meetings, work, or from getting things needed for daily living?: No  Discharge Planning Living Arrangements: Children, Other relatives Support Systems: Children, Other relatives Type of Residence: Private residence (Lives with dtr and grandson (1 level home, level entry)) Insurance Resources: Electrical engineer Resources: Family Support Financial Screen Referred: No Living Expenses: Lives with family (Lives with dtr and grandson) Money Management: Family Does the patient have any problems obtaining your medications?: No Home Management: Independent Patient/Family Preliminary Plans: Family able to assist if needed Care Coordinator Barriers to Discharge: Wound Care Care Coordinator Anticipated Follow Up Needs: HH/OP Expected length of stay: 14-16 Days  Clinical Impression Sw called pt daughter Herbert Seta). Introduced self, explained role and addressed. Pt lives with daughter and grandson. No additional questions or concerns, sw will continue to follow up  [name] 06/28/2021, 1:25 PM

## 2021-06-28 NOTE — Discharge Instructions (Signed)
Inpatient Rehab Discharge Instructions  Carlos Welch Discharge date and time: No discharge date for patient encounter.   Activities/Precautions/ Functional Status: Activity: activity as tolerated Diet:  Wound Care: Routine skin checks Functional status:  ___ No restrictions     ___ Walk up steps independently ___ 24/7 supervision/assistance   ___ Walk up steps with assistance ___ Intermittent supervision/assistance  ___ Bathe/dress independently ___ Walk with walker     _x__ Bathe/dress with assistance ___ Walk Independently    ___ Shower independently ___ Walk with assistance    ___ Shower with assistance ___ No alcohol     ___ Return to work/school ________  Special Instructions: No driving smoking or alcohol   My questions have been answered and I understand these instructions. I will adhere to these goals and the provided educational materials after my discharge from the hospital.  Patient/Caregiver Signature _______________________________ Date __________  Clinician Signature _______________________________________ Date __________  Please bring this form and your medication list with you to all your follow-up doctor's appointments.

## 2021-06-28 NOTE — Progress Notes (Signed)
PROGRESS NOTE   Subjective/Complaints:  No issues overnite, does not feel right side , needs assist with feeding   ROS- neg CP, SOB, N/V/D , + constip  Objective:   No results found. Recent Labs    06/26/21 0233 06/27/21 0337  WBC 4.8 5.5  HGB 15.2 15.6  HCT 47.1 48.3  PLT 149* 156   Recent Labs    06/26/21 0233 06/27/21 0337  NA 138 139  K 3.4* 4.1  CL 100 102  CO2 28 26  GLUCOSE 84 79  BUN 24* 26*  CREATININE 1.17 1.19  CALCIUM 9.8 9.7    Intake/Output Summary (Last 24 hours) at 06/28/2021 0737 Last data filed at 06/27/2021 1700 Gross per 24 hour  Intake 240 ml  Output --  Net 240 ml     Pressure Injury 05/12/20 Sacrum Stage 1 -  Intact skin with non-blanchable redness of a localized area usually over a bony prominence. (Active)  05/12/20 1700  Location: Sacrum  Location Orientation:   Staging: Stage 1 -  Intact skin with non-blanchable redness of a localized area usually over a bony prominence.  Wound Description (Comments):   Present on Admission: No    Physical Exam: Vital Signs Blood pressure (!) 138/103, pulse 62, temperature 98.5 F (36.9 C), resp. rate 16, height 6\' 2"  (1.88 m), weight 75.2 kg, SpO2 100 %.   General: No acute distress Mood and affect are appropriate Heart: Regular rate and rhythm no rubs murmurs or extra sounds Lungs: Clear to auscultation, breathing unlabored, no rales or wheezes Abdomen: Positive bowel sounds, soft nontender to palpation, nondistended Extremities: No clubbing, cyanosis, or edema Skin: No evidence of breakdown, no evidence of rash Neurologic: Cranial nerves II through XII intact, motor strength is 5/5 in left deltoid, bicep, tricep, grip, hip flexor, knee extensors, ankle dorsiflexor and plantar flexor Trace finger flexion on RIght , RLE 2- hip knee ext synergy , 3- Right ankle PF Sensory exam normal sensation to light touch LUE and LLE, absent on right  side  Cerebellar exam unable to perform  finger to nose to finger on RIght  due to weakness  Musculoskeletal: Full range of motion in all 4 extremities. No joint swelling   Assessment/Plan: 1. Functional deficits which require 3+ hours per day of interdisciplinary therapy in a comprehensive inpatient rehab setting. Physiatrist is providing close team supervision and 24 hour management of active medical problems listed below. Physiatrist and rehab team continue to assess barriers to discharge/monitor patient progress toward functional and medical goals  Care Tool:  Bathing              Bathing assist       Upper Body Dressing/Undressing Upper body dressing   What is the patient wearing?: Hospital gown only    Upper body assist Assist Level: Maximal Assistance - Patient 25 - 49%    Lower Body Dressing/Undressing Lower body dressing            Lower body assist       Toileting Toileting    Toileting assist Assist for toileting: Total Assistance - Patient < 25%     Transfers Chair/bed transfer  Transfers  assist           Locomotion Ambulation   Ambulation assist              Walk 10 feet activity   Assist           Walk 50 feet activity   Assist           Walk 150 feet activity   Assist           Walk 10 feet on uneven surface  activity   Assist           Wheelchair     Assist               Wheelchair 50 feet with 2 turns activity    Assist            Wheelchair 150 feet activity     Assist          Blood pressure (!) 138/103, pulse 62, temperature 98.5 F (36.9 C), resp. rate 16, height 6\' 2"  (1.88 m), weight 75.2 kg, SpO2 100 %.  Medical Problem List and Plan: 1.  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   Antithrombotics: -DVT/anticoagulation:  Mechanical: Sequential compression devices, below knee Bilateral lower extremities             -antiplatelet therapy: N/A 3. Pain: continue Neurontin 100 mg nightly, hydrocodone as needed 4. Mood: Provide emotional support             -antipsychotic agents: N/A 5. Neuropsych: This patient is capable of making decisions on his own behalf. 6. Skin/Wound Care: Routine skin checks 7. Fluids/Electrolytes/Nutrition: Routine in and outs with follow-up chemistries 8.  Dysphagia.  Dysphagia #2 nectar thick liquids.  Follow-up speech therapy 9.  Hypertension.  Continue Lasix 80 mg daily, hydralazine 50 mg every 8 hours, Cozaar 100 mg daily.  Monitor with increased mobility Vitals:   06/27/21 1951 06/28/21 0348  BP: (!) 136/93 (!) 138/103  Pulse: 67 62  Resp: 17 16  Temp: 98 F (36.7 C) 98.5 F (36.9 C)  SpO2: 96% 100%  Diastolic elevation , cont to monitor prior to med change  10.  Combined congestive heart failure.  Lasix 80 mg daily.  Monitor for any signs of fluid overload. Daily weights.  11.  Chronic bilateral lower extremity edema/cellulitis.  Continue Lasix.  Complete course of doxycycline 12.  Atrial fibrillation.  Eliquis held due to ICH.  Cardiac rate controlled 13.  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  Follow-up cardiology services 14.  Hyperlipidemia.  Currently holding statin due to ICH 15.  Chronic thrombocytopenia.  Platelets 124,000.  Follow-up plt 156K wnl    LOS: 1 days A FACE TO FACE EVALUATION WAS PERFORMED  06/30/21 06/28/2021, 7:37 AM

## 2021-06-29 DIAGNOSIS — I610 Nontraumatic intracerebral hemorrhage in hemisphere, subcortical: Secondary | ICD-10-CM | POA: Diagnosis not present

## 2021-06-29 MED ORDER — GABAPENTIN 100 MG PO CAPS
100.0000 mg | ORAL_CAPSULE | Freq: Three times a day (TID) | ORAL | Status: DC
  Administered 2021-06-29 – 2021-07-15 (×48): 100 mg via ORAL
  Filled 2021-06-29 (×48): qty 1

## 2021-06-29 MED ORDER — METHYLPHENIDATE HCL 5 MG PO TABS
5.0000 mg | ORAL_TABLET | Freq: Two times a day (BID) | ORAL | Status: DC
  Administered 2021-06-29 – 2021-07-06 (×14): 5 mg via ORAL
  Filled 2021-06-29 (×13): qty 1

## 2021-06-29 MED ORDER — TRAMADOL HCL 50 MG PO TABS
50.0000 mg | ORAL_TABLET | Freq: Four times a day (QID) | ORAL | Status: DC | PRN
Start: 2021-06-29 — End: 2021-07-15
  Administered 2021-06-29 – 2021-07-14 (×7): 50 mg via ORAL
  Filled 2021-06-29 (×7): qty 1

## 2021-06-29 MED ORDER — FUROSEMIDE 40 MG PO TABS
40.0000 mg | ORAL_TABLET | Freq: Every day | ORAL | Status: DC
  Administered 2021-06-30: 40 mg via ORAL
  Filled 2021-06-29: qty 1

## 2021-06-29 MED ORDER — ONDANSETRON HCL 4 MG PO TABS
4.0000 mg | ORAL_TABLET | Freq: Three times a day (TID) | ORAL | Status: DC | PRN
  Administered 2021-06-29: 4 mg via ORAL
  Filled 2021-06-29 (×2): qty 1

## 2021-06-29 MED ORDER — MEGESTROL ACETATE 400 MG/10ML PO SUSP
400.0000 mg | Freq: Two times a day (BID) | ORAL | Status: DC
  Administered 2021-06-29 – 2021-07-13 (×28): 400 mg via ORAL
  Filled 2021-06-29 (×29): qty 10

## 2021-06-29 NOTE — Progress Notes (Signed)
PROGRESS NOTE   Subjective/Complaints: Pt somnolent, ate ~50%  breakfast , empty fluid containers on tray   ROS- neg CP, SOB, N/V/D , + constip  Objective:   No results found. Recent Labs    06/27/21 0337 06/28/21 0958  WBC 5.5 4.9  HGB 15.6 17.3*  HCT 48.3 54.5*  PLT 156 72*    Recent Labs    06/27/21 0337 06/28/21 1154  NA 139 138  K 4.1 3.8  CL 102 98  CO2 26 30  GLUCOSE 79 103*  BUN 26* 30*  CREATININE 1.19 1.33*  CALCIUM 9.7 9.8    No intake or output data in the 24 hours ending 06/29/21 0801        Physical Exam: Vital Signs Blood pressure (!) 132/109, pulse 62, temperature 97.7 F (36.5 C), resp. rate 16, height $RemoveBe'6\' 2"'GHCCVkxlv$  (1.88 m), weight 75.2 kg, SpO2 97 %.   General: No acute distress Mood and affect are appropriate Heart: Regular rate and rhythm no rubs murmurs or extra sounds Lungs: Clear to auscultation, breathing unlabored, no rales or wheezes Abdomen: Positive bowel sounds, soft nontender to palpation, nondistended Extremities: No clubbing, cyanosis, or edema Skin: No evidence of breakdown, no evidence of rash Neurologic: Cranial nerves II through XII intact, motor strength is 5/5 in left deltoid, bicep, tricep, grip, hip flexor, knee extensors, ankle dorsiflexor and plantar flexor Trace finger flexion on RIght , RLE 2- hip knee ext synergy , 3- Right ankle PF Sensory exam normal sensation to light touch LUE and LLE, winces to pinch RUE not RLE Cerebellar exam unable to perform  finger to nose to finger on RIght  due to weakness  Musculoskeletal: Full range of motion in all 4 extremities. No joint swelling   Assessment/Plan: 1. Functional deficits which require 3+ hours per day of interdisciplinary therapy in a comprehensive inpatient rehab setting. Physiatrist is providing close team supervision and 24 hour management of active medical problems listed below. Physiatrist and rehab team  continue to assess barriers to discharge/monitor patient progress toward functional and medical goals  Care Tool:  Bathing    Body parts bathed by patient: Right arm, Chest, Abdomen, Right upper leg, Left upper leg   Body parts bathed by helper: Left arm, Front perineal area, Buttocks, Right lower leg, Left lower leg     Bathing assist Assist Level: Total Assistance - Patient < 25%     Upper Body Dressing/Undressing Upper body dressing   What is the patient wearing?: Pull over shirt    Upper body assist Assist Level: Maximal Assistance - Patient 25 - 49%    Lower Body Dressing/Undressing Lower body dressing      What is the patient wearing?: Incontinence brief, Pants     Lower body assist Assist for lower body dressing: 2 Helpers     Toileting Toileting    Toileting assist Assist for toileting: 2 Helpers     Transfers Chair/bed transfer  Transfers assist  Chair/bed transfer activity did not occur: Safety/medical concerns        Locomotion Ambulation   Ambulation assist   Ambulation activity did not occur: Safety/medical concerns  Walk 10 feet activity   Assist  Walk 10 feet activity did not occur: Safety/medical concerns        Walk 50 feet activity   Assist Walk 50 feet with 2 turns activity did not occur: Safety/medical concerns         Walk 150 feet activity   Assist Walk 150 feet activity did not occur: Safety/medical concerns         Walk 10 feet on uneven surface  activity   Assist Walk 10 feet on uneven surfaces activity did not occur: Safety/medical concerns         Wheelchair     Assist Is the patient using a wheelchair?: No Type of Wheelchair: Manual Wheelchair activity did not occur: Safety/medical concerns         Wheelchair 50 feet with 2 turns activity    Assist    Wheelchair 50 feet with 2 turns activity did not occur: Safety/medical concerns       Wheelchair 150 feet activity      Assist  Wheelchair 150 feet activity did not occur: Safety/medical concerns       Blood pressure (!) 132/109, pulse 62, temperature 97.7 F (36.5 C), resp. rate 16, height $RemoveBe'6\' 2"'CNQIWOkuy$  (1.88 m), weight 75.2 kg, SpO2 97 %.  Medical Problem List and Plan: 1.  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 Formulized individual treatment plan based on medical history, underlying problem and comorbidities.  2.  Antithrombotics: -DVT/anticoagulation:  Mechanical: Sequential compression devices, below knee Bilateral lower extremities             -antiplatelet therapy: N/A 3. Pain: continue Neurontin 100 mg nightly, hydrocodone as needed 4. Mood: Provide emotional support             -antipsychotic agents: N/A 5. Neuropsych: This patient is capable of making decisions on his own behalf. 6. Skin/Wound Care: Routine skin checks 7. Fluids/Electrolytes/Nutrition: Routine in and outs with follow-up chemistries 8.  Dysphagia.  Dysphagia #2 nectar thick liquids.  Follow-up speech therapy 9.  Hypertension.  Continue Lasix 80 mg daily, hydralazine 50 mg every 8 hours, Cozaar 100 mg daily.  Monitor with increased mobility Vitals:   06/28/21 1934 06/29/21 0347  BP: (!) 156/105 (!) 132/109  Pulse: 62 62  Resp: 16 16  Temp: 97.8 F (36.6 C) 97.7 F (36.5 C)  SpO2: 66% 06%  Diastolic elevation , cont to monitor prior to med change  10.  Combined congestive heart failure.  Lasix 80 mg daily.  looks dry , BUN rising no edema will reduce lasix to $Remove'40mg'hfriPLg$   Elevated BUN likely due to diuretic use  11.  Chronic bilateral lower extremity edema/cellulitis.  Continue Lasix.  Complete course of doxycycline 12.  Atrial fibrillation.  Eliquis held  due to [place].  Cardiac rate controlled 13.  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  Follow-up cardiology services 14.  Hyperlipidemia.  Currently holding statin due to [place] 15.  Chronic thrombocytopenia.    Follow-up plt 72 K will recheck in am     LOS: 2 days A FACE TO [place] E Mintie [place] 06/29/2021,  8:01 AM

## 2021-06-29 NOTE — Progress Notes (Addendum)
Patient ID: Carlos Welch, male   DOB: 09-Jul-1946, 75 y.o.   MRN: [id number] Team Conference Report to Patient/Family  Team Conference discussion was reviewed with the patient and caregiver, including goals, any changes in plan of care and target discharge date.  Patient and caregiver express understanding and are in agreement.  The patient has a target discharge date of 07/20/21.  SW called pt daughter at bedside. Provided team conference updates. Pt daughter reports pt had this behavior. Reports therapy team can call her to provide information to the team. Pt spouse would like to speak with the physician about pt medications to discuss sides effects.Daughter reports she is busy with her role and travels a lot, leaving for [facility] today. No additional questions or concerns, sw will continue to follow up.   [name] 06/29/2021, 1:17 PM

## 2021-06-29 NOTE — Progress Notes (Signed)
Speech Language Pathology Daily Session Note  Patient Details  Name: Carlos Welch MRN: [id number] Date of Birth: 10/01/1946  Today's Date: 06/29/2021 SLP Individual Time: 1300-1400 SLP Individual Time Calculation (min): 60 min  Short Term Goals: Week 1: SLP Short Term Goal 1 (Week 1): Patient will participate in further cognitive and expressive/receptive language testing to determine functional needs. SLP Short Term Goal 2 (Week 1): Patient will orient x4 with min A verbal cues for use of external aids across 3 consecutive sessions SLP Short Term Goal 3 (Week 1): Patient will demonstrate sustained attention to functional tasks for 5-10 minute intervals with min A verbal redirection. SLP Short Term Goal 4 (Week 1): Patient will complete mildly complex problem solving with mod A verbal cues SLP Short Term Goal 5 (Week 1): Patient will tolerate PO trials of Dys 3 textures with effective mastication and oral clearance prior to diet advancement with min A verbal cues to implement swallow strategies  Skilled Therapeutic Interventions: Patient received in wheelchair on arrival. He requested to get into bed secondary to discomfort. Pt was unable to verbalize pain descriptors, just expressed "it hurts all over." He became verbally agitated when SLP informed him it would take a few minutes to wait for additional assistance 2/2 to recommendations for +2 transfer. After nurse arrived for assist, SLP explained that we would be utilizing the steady to safely execute transfer. At this point pt began to yell, pushed steady away, and exclaimed "just get me into bed." After a few minute agitation improved and was agreeable to utilize steady to transfer to bed. Once in bed, he expressed "don't ask no questions, just be quiet." He was agreeable to taking pain medication although refused crushed meds. Facilitated trail of whole pills one-at-a-time in applesauce in which patient appeared to clear timely and effectively  without pharyngeal symptoms. Consumed single cup sips of nectar thick liquids without overt s/sx of aspiration and clear vocal quality post swallows. Recommend whole pills in puree and taken one-at-a-time. NSG reported inefficient mastication with meals earlier today secondary to poor fitting bottom denture. Pt still exhibited residuals in oral cavity from lunch earlier and was agreeable to oral care to clear where he required total A and would only allow SLP to clean bottom denture. SLP placed denture adhesive on bottom denture. Signs were updated in patient's room to reflect change in method of taking oral medications and nurse was notified. Patient was left in bed with alarm activated and immediate needs within reach at end of session. Continue per current plan of care.    Pain Pain Assessment Pain Scale: 0-10 Pain Score: 8  Faces Pain Scale: Hurts a little bit Pain Type: Neuropathic pain Pain Location: Generalized Pain Orientation: Anterior;Posterior Pain Descriptors / Indicators: Aching Pain Onset: On-going Pain Intervention(s): Repositioned;Emotional support;RN made aware Multiple Pain Sites: No 2nd Pain Site Patient's Stated Pain Goal: 1 PAINAD (Pain Assessment in Advanced Dementia) Breathing: occasional labored breathing, short period of hyperventilation Negative Vocalization: occasional moan/groan, low speech, negative/disapproving quality Facial Expression: sad, frightened, frown Body Language: tense, distressed pacing, fidgeting Consolability: distracted or reassured by voice/touch PAINAD Score: 5  Therapy/Group: Individual Therapy  Tamala Ser 06/29/2021, 1:26 PM

## 2021-06-29 NOTE — Progress Notes (Signed)
Patient resting in bed throughout the day. Continue to encourage meals and fluids throughout the day. Patient continues to refuse meals. Writer assisted patient with breakfast, patient had less than 25% breakfast, refused all lunch and refused all dinner. Several attempts by staff members to assist with meals. Patient gets angry and starts yelling and attempting to through food trays on the floor. Continued to encourage fluids throughout the day.

## 2021-06-29 NOTE — Progress Notes (Signed)
Occupational Therapy Session Note  Patient Details  Name: Carlos Welch MRN: [id number] Date of Birth: Jun 20, 1946  Today's Date: 06/29/2021 OT Individual Time: 0822-0904 OT Individual Time Calculation (min): 42 min + 23 min + 15 missed minutes (pain, agitation)   Short Term Goals: Week 1:  OT Short Term Goal 1 (Week 1): Pt will don shirt with mod A. OT Short Term Goal 2 (Week 1): Pt will don pants with max A.+ LRAD. OT Short Term Goal 3 (Week 1): Pt will tolerate completing ADL routine 2/3 sessions with no more than min VCs to redirect agitation. OT Short Term Goal 4 (Week 1): Pt will maintain static sitting with CGA and no posterior LOB for >5 min in prep for seated ADL.  Skilled Therapeutic Interventions/Progress Updates:    Session 1 [phone]): Pt received semi-reclined in bed, c/o ongoing R sided pain, per RN premedicated, initially agreeable to therapy with much encouragement. Session focus on self-care retraining, activity tolerance, func transfers, orientation in prep for improved ADL/IADL/func mobility performance + decreased caregiver burden. Pt noted to keep eyes closed throughout session. Oriented loosely to location "hospital" and situation "I had a stroke."  Gown noted to be soaked with orange juice, brief saturated with urine and pt with poor awareness.  With encouragement, pt agreeable to change clothes. Came to sitting EOB with max A 2/2 posterior LOB horizontally onto bed. Total A to doff hospital gown/to don shirt. Pt suddenly becoming verbally agitated and reports feeling ill "oh God, I feel so bad, I need to lay down." Pt unable to to be meaningful redirected to complete ADL. Total A + 2 to complete toileting/brief change/reposition in bed. Pt demanding to use bed pan, once placed, immediately requesting for it be removed. Pt able to roll R in bed with mod A to assist.  Pt left with HOB 30 degrees with bed alarm engaged, call bell in reach, and all immediate needs met. Noted  to immediately falls asleep upon return to supine. Pt missed 15 scheduled OT 2/2 agitation/pain/feeling ill.   Session 2 [phone]): Pt received with eyes clothes semi-reclined in bed, does not endorse pain when asked, agreeable to bed level therapy. Session focus on self-care retraining, activity tolerance, RUE NMR in prep for improved ADL/IADL/func mobility performance + decreased caregiver burden. Completed 1x10 soft sponge squeezes, 2x10 biceps curls, wrist extension, and tapping top of sponge with hand. Req max A to support RUE and achieve full ROM. Pt with difficulty opening/releasing grasp when instructed. Pt noted to count along with exercises.   Donned one pair of glasses without tape on pt, pt denies diplopia but endorses some "blurriness." Able to read large print orientation sheet on wall, but not printed calendar. Attempted to read schedule, but unable to read session times accurately, questions visual deficits vs cognitive deficits.   Discussion around purpose of rehab/rehab goals to facilitate education/therapeutic rapport building for future sessions. Pt verbalizes "to get better" as goal for rehab.   Therapeutic positioning of RUE/hand with pillows to decrease risk for contracture/exacerbation of shoulder sublux/maintain skin integrity.   Pt left with HOB over 30 degrees with bed alarm engaged, call bell in reach, and all immediate needs met.    Therapy Documentation Precautions:  Precautions Precautions: Fall Precaution Comments: R hemi, R inattention, decr R sensation, small subluxation of R shoulder, fluctuating agitation, hypertension, lightheadedness/ blurry vision/ nausea with change in position Restrictions Weight Bearing Restrictions: No  Pain: ongoing R sided pain, unable to rate or specify  ADL: See Care Tool for more details.   Therapy/Group: Individual Therapy  [name] MS, OTR/L  06/29/2021, 6:37 AM

## 2021-06-29 NOTE — Patient Care Conference (Signed)
Inpatient RehabilitationTeam Conference and Plan of Care Update Date: 06/29/2021   Time: 10:08 AM    Patient Name: Carlos Welch      Medical Record Number: [id number]  Date of Birth: Apr 05, 1946 Sex: Male         Room/Bed: 4M03C/4M03C-01 Payor Info: Payor: MEDICARE / Plan: MEDICARE PART A AND B / Product Type: *No Product type* /    Admit Date/Time:  06/27/2021  4:24 PM  Primary Diagnosis:  ICH (intracerebral hemorrhage) [facility])  Hospital Problems: Principal Problem:   ICH (intracerebral hemorrhage) [facility])    Expected Discharge Date: Expected Discharge Date: 07/20/21  Team Members Present: Physician leading conference: Dr. Claudette Laws Social Worker Present: Lavera Guise, BSW Nurse Present: Chana Bode, RN PT Present: Grier Rocher, PT OT Present: Annye English, OT SLP Present: Eilene Ghazi, SLP PPS Coordinator present : Fae Pippin, SLP     Current Status/Progress Goal Weekly Team Focus  Bowel/Bladder   Pt. is incontinent B/B. LBM 06/26/2021  Pt. will regain continence  Toilet pt. q2 and PRN   Swallow/Nutrition/ Hydration   Dys 2, nectar thick  Sup A  Tolerance of Dys 2, nectar thick liquids, safe swallow techniques, Dys 3 trials as appropriate   ADL's   total A + 2 in stedy for toilet transfer, LBD for STS, max A for UBD, mod A UBB seated EOB/LBB bed level, min A for seated grooming; limited by fluctuating agitation/lethargy during eval; RUE, hand Brunstrum level II  min A toileting, bathroom transfers, dressing, bathing; S self-feeding, grooming  RUE NMR, balance/self-care/transfer retraining, func cognition, pt/family/DME/AE education, activity tolerance   Mobility   Bed mobility = Min A with max encouragement, seated balance maintained with CGA/ supervision; unable to do more during eval d/t lightheadedness, nausea, blurry and double vision with change in position; BP at rest in supine 148/ 107  Bed mobility at supervision/ CGA, Transfers and gait at Min  A; w/c mobility at [facility]  First week to continue to train with changes in position in coordination with medication adjustments while monitoring BP, as tolerated, then R NMR, standing balance/ tolerance, transfer training, gait training, w/c mobility training   Communication   further ST assessment needed         Safety/Cognition/ Behavioral Observations  Mod-to-max? Difficult to assess due to limited assessment 2/2 increased agitation and lethargy  min A  Further cognitive and language assessment, orientation, focused attention, problem solving   Pain   Pain is controlled with current pain medications  Pt. will verbalize a pain  level < 3  Assess pt. for pain on qshift and PRN   Skin   No skin issues noted at this moment  Pt. will maintain intact skin intergrity  Assess pt. skin for breakdown qshift and PRN     Discharge Planning:  Lives with daughter and grandson providing 24/7 assistance   Team Discussion: Patient admitted post left thalamic ICH with hx of A-fib on Eliquis,  and HF. BP labile. Patient displaying agitation vs intolerance but can be redirected at times, lethargy, dizziness, nausea and decreased orientation, awareness of deficits and pain in legs/feet. Discussion of need for stimulant and MD to adjust medications.  Patient on target to meet rehab goals: Currently refusing therapy; hesitant to move due to symptoms associated with movement but functionally mobile when he participates. Note total assist of 2 for sit- stand, toileting at a bed level. Needs max assist for upper body dressing due to right upper extremity weakness. MBS 06/28/21;  on D2 Nectar diet with meds crushed in applesauce/puree. Goals for discharge set for min assist overall.  *See Care Plan and progress notes for long and short-term goals.   Revisions to Treatment Plan:  Neuropsych consult recommended Timed toileting schedule and supervision/prompting for increased meal intake Working on orientation, focused  attention, awareness and insight   Teaching Needs: Safety, secondary risk management, medications, etc  Current Barriers to Discharge: Decreased caregiver support, Home enviroment access/layout, Incontinence, and Behavior  Possible Resolutions to Barriers: Family education     Medical Summary Current Status: Blood pressure labile, has prerenal azotemia, heart failure well controlled, hypersomnolence likely due to CVA  Barriers to Discharge: Medical stability   Possible Resolutions to [employer] Focus: May need to adjust BP meds, adjust diuretic therapy, consider neuro stimulant   Continued Need for Acute Rehabilitation Level of Care: The patient requires daily medical management by a physician with specialized training in physical medicine and rehabilitation for the following reasons: Direction of a multidisciplinary physical rehabilitation program to maximize functional independence : Yes Medical management of patient stability for increased activity during participation in an intensive rehabilitation regime.: Yes Analysis of laboratory values and/or radiology reports with any subsequent need for medication adjustment and/or medical intervention. : Yes   I attest that I was present, lead the team conference, and concur with the assessment and plan of the team.   Chana Bode B 06/29/2021, 11:35 AM

## 2021-06-29 NOTE — IPOC Note (Signed)
Overall Plan of Care [facility]) Patient Details Name: Carlos Welch MRN: [id number] DOB: 1946-07-20  Admitting Diagnosis: ICH (intracerebral hemorrhage) [facility])  Hospital Problems: Principal Problem:   ICH (intracerebral hemorrhage) (HCC)     Functional Problem List: Nursing Bladder, Endurance, Medication Management, Safety, Bowel, Skin Integrity  PT Balance, Behavior, Endurance, Motor, Nutrition, Pain, Perception, Safety, Sensory, Skin Integrity  OT Balance, Motor, Sensory, Skin Integrity, Pain, Vision, Perception, Safety, Endurance, Cognition, Behavior  SLP Behavior, Cognition, Nutrition  TR         Basic ADL's: OT Eating, Grooming, Bathing, Dressing, Toileting     Advanced  ADL's: OT       Transfers: PT Bed Mobility, Bed to Chair, Car, Occupational psychologist, Research scientist (life sciences): PT Ambulation, Psychologist, prison and probation services, Stairs     Additional Impairments: OT Fuctional Use of Upper Extremity  SLP Swallowing, Communication, Social Cognition expression, comprehension Problem Solving, Social Interaction, Memory, Awareness, Attention  TR      Anticipated Outcomes Item Anticipated Outcome  Self Feeding S  Swallowing  sup A   Basic self-care  min A  Toileting  min A   Bathroom Transfers min A  Bowel/Bladder  Manage bowel and bladder with min assist  Transfers  CGA/ min A  Locomotion  Min A with LRAD  Communication  mod I  Cognition  min A  Pain  n/a  Safety/Judgment  maintain safety w cues   Therapy Plan: PT Intensity: Minimum of 1-2 x/day ,45 to 90 minutes PT Frequency: 5 out of 7 days PT Duration Estimated Length of Stay: 2.5 - 3.5 weeks OT Intensity: Minimum of 1-2 x/day, 45 to 90 minutes OT Frequency: 5 out of 7 days OT Duration/Estimated Length of Stay: 3 to 3.5 weeks SLP Intensity: Minumum of 1-2 x/day, 30 to 90 minutes SLP Frequency: 3 to 5 out of 7 days SLP Duration/Estimated Length of Stay: 3-3.5 weeks   Due to the current state of emergency,  patients may not be receiving their 3-hours of Medicare-mandated therapy.   Team Interventions: Nursing Interventions Patient/Family Education, Bowel Management, Skin Care/Wound Management, Medication Management, Disease Management/Prevention, Bladder Management, Discharge Planning  PT interventions Ambulation/gait training, Balance/vestibular training, Cognitive remediation/compensation, Community reintegration, Discharge planning, Disease management/prevention, DME/adaptive equipment instruction, Functional mobility training, Neuromuscular re-education, Pain management, Patient/family education, Psychosocial support, Skin care/wound management, Splinting/orthotics, Stair training, Therapeutic Activities, Therapeutic Exercise, UE/LE Strength taining/ROM, UE/LE Coordination activities, Visual/perceptual remediation/compensation, Wheelchair propulsion/positioning  OT Interventions Warden/ranger, Community reintegration, Disease mangement/prevention, Development worker, international aid stimulation, Neuromuscular re-education, Patient/family education, Self Care/advanced ADL retraining, Splinting/orthotics, Therapeutic Exercise, UE/LE Coordination activities, Wheelchair propulsion/positioning, Visual/perceptual remediation/compensation, UE/LE Strength taining/ROM, Therapeutic Activities, Skin care/wound managment, Psychosocial support, Pain management, Functional mobility training, DME/adaptive equipment instruction, Discharge planning, Cognitive remediation/compensation  SLP Interventions Cognitive remediation/compensation, Environmental controls, Internal/external aids, Other (comment), Functional tasks, Dysphagia/aspiration precaution training, Financial trader, Patient/family education (further cognitive and language assessment)  TR Interventions    SW/CM Interventions Discharge Planning, Psychosocial Support, Patient/Family Education, Disease Management/Prevention   Barriers to Discharge MD  Medical  stability and somnolence due to CVA  Nursing Decreased caregiver support, Home environment access/layout, Incontinence, Wound Care 1 level w dtr, grand son  PT Inaccessible home environment, Decreased caregiver support, Home environment access/layout, Incontinence, Lack of/limited family support, Insurance for SNF coverage, Weight, Behavior, Nutrition means    OT Behavior    SLP Behavior Receptiveness to treatment  SW Wound Care     Team Discharge Planning: Destination: PT-Home ,OT- Home , SLP-Home Projected Follow-up:  PT-Home health PT, OT-  Home health OT, 24 hour supervision/assistance, SLP-24 hour supervision/assistance (TBD) Projected Equipment Needs: PT-To be determined, OT- To be determined, SLP-None recommended by SLP Equipment Details: PT-TBD based on progress, OT-  Patient/family involved in discharge planning: PT- Patient,  OT-Patient, SLP-Patient unable/family or caregive not available  MD ELOS: 14-17d Medical Rehab Prognosis:  Fair Assessment: 75 year old right-handed male with history of atrial fibrillation maintained on Eliquis, aortic stenosis status post aortic valve replacement, CAD status post CABG, bilateral lower extremity cellulitis, combined CHF, hypertension, hyperlipidemia, thrombocytopenia, remote tobacco abuse.  Per chart review patient lives with his daughter and family.  1 level home.  Walks with a cane.  Reports dressing and bathing himself prior to admission independently.  He has good family support.  Presented 06/23/2021 with acute onset of right-sided weakness and slurred speech.  Cranial CT scan showed acute hemorrhage centered in the left thalamus with an estimated volume of 9 mL.  No intraventricular or extra axial extension of blood.  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  His chronic Eliquis was discontinued due to hematoma.  CT angiogram head and neck negative for LVO or intracranial hemorrhage.  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  Patient did not receive tPA due to hematoma.  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  Multi foci of susceptibility artifact scattered throughout the supratentorial infratentorial compartment suggesting multiple prior micro bleeds raising suspicion for amyloid angiopathy.  Admission chemistries unremarkable except BUN 27.  Echocardiogram with ejection fraction of 60 to 65% no wall motion abnormalities.  Neurology follow-up close monitoring of blood pressure.  Maintain on a dysphagia #2 nectar thick liquid diet    Now requiring 24/7 Rehab RN,MD, as well as CIR level PT, OT and SLP.  Treatment team will focus on ADLs and mobility with goals set at minimal assistance See Team Conference Notes for weekly updates to the plan of care

## 2021-06-29 NOTE — Progress Notes (Signed)
Physical Therapy Session Note  Patient Details  Name: Carlos Welch MRN: [id number] Date of Birth: 04/21/1946  Today's Date: 06/29/2021 PT Individual Time: 1105-1200 PT Individual Time Calculation (min): 55 min   Short Term Goals: Week 1:  PT Short Term Goal 1 (Week 1): Pt will perform all bed mobility with consistent CGA. PT Short Term Goal 2 (Week 1): Pt will perform dynamic sitting balance tasks with Min A. PT Short Term Goal 3 (Week 1): Pt will perform sit <> stand with Mod A. PT Short Term Goal 4 (Week 1): Pt will initiate gait training with LRAD. PT Short Term Goal 5 (Week 1): Pt will decrease complaint of lightheadedness/ dizziness with mobility by 50%.   Skilled Therapeutic Interventions/Progress Updates:   Pt received supine in bed and agreeable to PT at bed level. Supine NMR SLR, heel slides, hip abduction each completed x 10 BLE with AAROM on the RLE. Rolling R and L with supervision assist to the L and min assist to the R x2. PT obtained reclining back WC. Supine>sit transfer with mod assist on the L side of the bed. Stedy transfer to [facility] with mod assist to maintain balance. Pt noted pain in seated position to elects to stay standing for transfer. Pt left sitting in reclining back WC with alarm belt in place and call bell in reach.        Therapy Documentation Precautions:  Precautions Precautions: Fall Precaution Comments: R hemi, R inattention, decr R sensation, small subluxation of R shoulder, fluctuating agitation, hypertension, lightheadedness/ blurry vision/ nausea with change in position Restrictions Weight Bearing Restrictions: No   Pain: Pain Assessment Pain Scale: 0-10 Pain Score: 9  Faces Pain Scale: Hurts even more Pain Type: Neuropathic pain Pain Location: Generalized Pain Orientation: Right;Posterior;Anterior Pain Descriptors / Indicators: Aching Pain Onset: On-going Pain Intervention(s): Repositioned Multiple Pain Sites: No 2nd Pain Site Patient's  Stated Pain Goal: 1 PAINAD (Pain Assessment in Advanced Dementia) Breathing: occasional labored breathing, short period of hyperventilation Negative Vocalization: occasional moan/groan, low speech, negative/disapproving quality Facial Expression: sad, frightened, frown Body Language: tense, distressed pacing, fidgeting Consolability: no need to console PAINAD Score: 4     Therapy/Group: Individual Therapy  Golden Pop 06/29/2021, 12:14 PM

## 2021-06-29 NOTE — Plan of Care (Signed)
  Problem: RH Balance Goal: LTG Patient will maintain dynamic sitting balance (PT) Description: LTG:  Patient will maintain dynamic sitting balance with assistance during mobility activities (PT) Flowsheets (Taken 06/28/2021 1716) LTG: Pt will maintain dynamic sitting balance during mobility activities with:: Supervision/Verbal cueing   Problem: Sit to Stand Goal: LTG:  Patient will perform sit to stand with assistance level (PT) Description: LTG:  Patient will perform sit to stand with assistance level (PT) Flowsheets (Taken 06/28/2021 1716) LTG: PT will perform sit to stand in preparation for functional mobility with assistance level: Minimal Assistance - Patient > 75%   Problem: RH Bed Mobility Goal: LTG Patient will perform bed mobility with assist (PT) Description: LTG: Patient will perform bed mobility with assistance, with/without cues (PT). Flowsheets (Taken 06/28/2021 1716) LTG: Pt will perform bed mobility with assistance level of: Supervision/Verbal cueing   Problem: RH Bed to Chair Transfers Goal: LTG Patient will perform bed/chair transfers w/assist (PT) Description: LTG: Patient will perform bed to chair transfers with assistance (PT). Flowsheets (Taken 06/28/2021 1716) LTG: Pt will perform Bed to Chair Transfers with assistance level: Minimal Assistance - Patient > 75%   Problem: RH Car Transfers Goal: LTG Patient will perform car transfers with assist (PT) Description: LTG: Patient will perform car transfers with assistance (PT). Flowsheets (Taken 06/28/2021 1716) LTG: Pt will perform car transfers with assist:: Minimal Assistance - Patient > 75%   Problem: RH Furniture Transfers Goal: LTG Patient will perform furniture transfers w/assist (OT/PT) Description: LTG: Patient will perform furniture transfers  with assistance (OT/PT). Flowsheets (Taken 06/28/2021 1716) LTG: Pt will perform furniture transfers with assist:: Minimal Assistance - Patient > 75%   Problem: RH  Ambulation Goal: LTG Patient will ambulate in controlled environment (PT) Description: LTG: Patient will ambulate in a controlled environment, # of feet with assistance (PT). Flowsheets (Taken 06/28/2021 1716) LTG: Pt will ambulate in controlled environ  assist needed:: Minimal Assistance - Patient > 75% LTG: Ambulation distance in controlled environment: 100 feet using LRAD Goal: LTG Patient will ambulate in home environment (PT) Description: LTG: Patient will ambulate in home environment, # of feet with assistance (PT). Flowsheets (Taken 06/28/2021 1716) LTG: Pt will ambulate in home environ  assist needed:: Minimal Assistance - Patient > 75% LTG: Ambulation distance in home environment: at least 50 feet using LRAD   Problem: RH Wheelchair Mobility Goal: LTG Patient will propel w/c in controlled environment (PT) Description: LTG: Patient will propel wheelchair in controlled environment, # of feet with assist (PT) Flowsheets (Taken 06/28/2021 1716) LTG: Pt will propel w/c in controlled environ  assist needed:: Contact Guard/Touching assist LTG: Propel w/c distance in controlled environment: 150 feet Goal: LTG Patient will propel w/c in home environment (PT) Description: LTG: Patient will propel wheelchair in home environment, # of feet with assistance (PT). Flowsheets (Taken 06/28/2021 1716) LTG: Pt will propel w/c in home environ  assist needed:: Contact Guard/Touching assist Distance: wheelchair distance in controlled environment: 150 LTG: Propel w/c distance in home environment: at least 50 feet   Problem: RH Stairs Goal: LTG Patient will ambulate up and down stairs w/assist (PT) Description: LTG: Patient will ambulate up and down # of stairs with assistance (PT) Flowsheets (Taken 06/28/2021 1716) LTG: Pt will ambulate up/down stairs assist needed:: Moderate Assistance - Patient 50 - 74% LTG: Pt will  ambulate up and down number of stairs: at least 4 steps with HR setup as per home  environment

## 2021-06-29 NOTE — Progress Notes (Signed)
Patient is A&O x1. Patient was assisted with breakfast and lunch. Continues to eat only 3-4 spoonfuls. Encouraged fluids. Dr. And ST notified of poor appetite.

## 2021-06-30 ENCOUNTER — Inpatient Hospital Stay (HOSPITAL_COMMUNITY): Payer: Medicare Other

## 2021-06-30 LAB — CBC WITH DIFFERENTIAL/PLATELET
Abs Immature Granulocytes: 0.02 10*3/uL (ref 0.00–0.07)
Basophils Absolute: 0.1 10*3/uL (ref 0.0–0.1)
Basophils Relative: 1 %
Eosinophils Absolute: 0.1 10*3/uL (ref 0.0–0.5)
Eosinophils Relative: 2 %
HCT: 54 % — ABNORMAL HIGH (ref 39.0–52.0)
Hemoglobin: 17 g/dL (ref 13.0–17.0)
Immature Granulocytes: 0 %
Lymphocytes Relative: 16 %
Lymphs Abs: 1 10*3/uL (ref 0.7–4.0)
MCH: 27.8 pg (ref 26.0–34.0)
MCHC: 31.5 g/dL (ref 30.0–36.0)
MCV: 88.2 fL (ref 80.0–100.0)
Monocytes Absolute: 0.8 10*3/uL (ref 0.1–1.0)
Monocytes Relative: 13 %
Neutro Abs: 4.3 10*3/uL (ref 1.7–7.7)
Neutrophils Relative %: 68 %
Platelets: 174 10*3/uL (ref 150–400)
RBC: 6.12 MIL/uL — ABNORMAL HIGH (ref 4.22–5.81)
RDW: 19.5 % — ABNORMAL HIGH (ref 11.5–15.5)
WBC: 6.3 10*3/uL (ref 4.0–10.5)
nRBC: 0 % (ref 0.0–0.2)

## 2021-06-30 MED ORDER — LIDOCAINE HCL URETHRAL/MUCOSAL 2 % EX GEL
1.0000 "application " | Freq: Once | CUTANEOUS | Status: AC
  Administered 2021-06-30: 1 via URETHRAL
  Filled 2021-06-30: qty 6

## 2021-06-30 MED ORDER — CHLORHEXIDINE GLUCONATE CLOTH 2 % EX PADS
6.0000 | MEDICATED_PAD | Freq: Every day | CUTANEOUS | Status: DC
  Administered 2021-06-30 – 2021-07-06 (×8): 6 via TOPICAL

## 2021-06-30 MED ORDER — SODIUM CHLORIDE 0.45 % IV SOLN
75.0000 mL/h | INTRAVENOUS | Status: AC
  Administered 2021-06-30: 75 mL/h via INTRAVENOUS

## 2021-06-30 NOTE — Progress Notes (Signed)
Occupational Therapy Note  Patient Details  Name: Carlos Welch MRN: [id number] Date of Birth: 1946/09/18  Today's Date: 06/30/2021 OT Missed Time: 30 Minutes Missed Time Reason: Patient fatigue;Pain  Pt greeted semi-reclined in bed. Pt would not open eyes but shook head no. Patient with low BP and pain declining to participate in OT at this time. Will follow up per plan of care.   Merlene Laughter Sheleen Conchas 06/30/2021, 11:20 AM

## 2021-06-30 NOTE — Progress Notes (Signed)
PROGRESS NOTE   Subjective/Complaints: Up in WC eating breakfast , pocketing  Labs reviewed ROS- neg CP, SOB, N/V/D , + constip  Objective:   No results found. Recent Labs    06/28/21 0958 06/30/21 0520  WBC 4.9 6.3  HGB 17.3* 17.0  HCT 54.5* 54.0*  PLT 72* 174    Recent Labs    06/28/21 1154  NA 138  K 3.8  CL 98  CO2 30  GLUCOSE 103*  BUN 30*  CREATININE 1.33*  CALCIUM 9.8     Intake/Output Summary (Last 24 hours) at 06/30/2021 0725 Last data filed at 06/29/2021 1758 Gross per 24 hour  Intake 188 ml  Output --  Net 188 ml          Physical Exam: Vital Signs Blood pressure 104/78, pulse (!) 56, temperature 97.8 F (36.6 C), temperature source Oral, resp. rate 18, height 6\' 2"  (1.88 m), weight 75.2 kg, SpO2 98 %.  General: No acute distress Mood and affect are appropriate Heart: Regular rate and rhythm no rubs murmurs or extra sounds Lungs: Clear to auscultation, breathing unlabored, no rales or wheezes Abdomen: Positive bowel sounds, soft nontender to palpation, nondistended Extremities: No clubbing, cyanosis, or edema Skin: No evidence of breakdown, + stasis dermatitis with flaking in BLEs   Neurologic: Cranial nerves II through XII intact, motor strength is 5/5 in left deltoid, bicep, tricep, grip, hip flexor, knee extensors, ankle dorsiflexor and plantar flexor Trace finger flexion on RIght , RLE 2- hip knee ext synergy , 3- Right ankle PF Sensory exam normal sensation to light touch LUE and LLE, winces to pinch RUE not RLE Cerebellar exam unable to perform  finger to nose to finger on RIght  due to weakness  Musculoskeletal: Full range of motion in all 4 extremities. No joint swelling   Assessment/Plan: 1. Functional deficits which require 3+ hours per day of interdisciplinary therapy in a comprehensive inpatient rehab setting. Physiatrist is providing close team supervision and 24 hour  management of active medical problems listed below. Physiatrist and rehab team continue to assess barriers to discharge/monitor patient progress toward functional and medical goals  Care Tool:  Bathing    Body parts bathed by patient: Right arm, Chest, Abdomen, Right upper leg, Left upper leg   Body parts bathed by helper: Left arm, Front perineal area, Buttocks, Right lower leg, Left lower leg     Bathing assist Assist Level: Total Assistance - Patient < 25%     Upper Body Dressing/Undressing Upper body dressing   What is the patient wearing?: Pull over shirt    Upper body assist Assist Level: Maximal Assistance - Patient 25 - 49%    Lower Body Dressing/Undressing Lower body dressing      What is the patient wearing?: Incontinence brief, Pants     Lower body assist Assist for lower body dressing: 2 Helpers     Toileting Toileting    Toileting assist Assist for toileting: 2 Helpers     Transfers Chair/bed transfer  Transfers assist  Chair/bed transfer activity did not occur: Safety/medical concerns        Locomotion Ambulation   Ambulation assist   Ambulation  activity did not occur: Safety/medical concerns          Walk 10 feet activity   Assist  Walk 10 feet activity did not occur: Safety/medical concerns        Walk 50 feet activity   Assist Walk 50 feet with 2 turns activity did not occur: Safety/medical concerns         Walk 150 feet activity   Assist Walk 150 feet activity did not occur: Safety/medical concerns         Walk 10 feet on uneven surface  activity   Assist Walk 10 feet on uneven surfaces activity did not occur: Safety/medical concerns         Wheelchair     Assist Is the patient using a wheelchair?: Yes (Per PT long term goals) Type of Wheelchair: Manual Wheelchair activity did not occur: Safety/medical concerns         Wheelchair 50 feet with 2 turns activity    Assist    Wheelchair 50  feet with 2 turns activity did not occur: Safety/medical concerns       Wheelchair 150 feet activity     Assist  Wheelchair 150 feet activity did not occur: Safety/medical concerns       Blood pressure 104/78, pulse (!) 56, temperature 97.8 F (36.6 C), temperature source Oral, resp. rate 18, height 6\' 2"  (1.88 m), weight 75.2 kg, SpO2 98 %.  Medical Problem List and Plan: 1.  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  Antithrombotics: -DVT/anticoagulation:  Mechanical: Sequential compression devices, below knee Bilateral lower extremities             -antiplatelet therapy: N/A 3. Pain: continue Neurontin 100 mg nightly, hydrocodone as needed 4. Mood: Provide emotional support             -antipsychotic agents: N/A 5. Neuropsych: This patient is capable of making decisions on his own behalf. 6. Skin/Wound Care: Routine skin checks- add lac hydrin  7. Fluids/Electrolytes/Nutrition: Routine in and outs with follow-up chemistries, poor intake on megestrol  8.  Dysphagia.  Dysphagia #2 nectar thick liquids.  Follow-up speech therapy 9.  Hypertension.  Continue Lasix 80 mg daily, hydralazine 50 mg every 8 hours, Cozaar 100 mg daily.  Monitor with increased mobility Vitals:   06/29/21 1948 06/30/21 0537  BP: (!) 123/94 104/78  Pulse: 69 (!) 56  Resp: 18 18  Temp: 97.8 F (36.6 C) 97.8 F (36.6 C)  SpO2: 98% 98%  BP improving   10.  Combined congestive heart failure.  Lasix 80 mg daily.  looks dry , BUN rising no edema will reduce lasix to 40mg   Elevated BUN likely due to diuretic use  11.  Chronic bilateral lower extremity edema/cellulitis.  Continue Lasix.  Complete course of doxycycline 12.  Atrial fibrillation.  Eliquis held due to ICH.  Cardiac rate controlled 13.  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  Follow-up  cardiology services 14.  Hyperlipidemia.  Currently holding statin due to ICH 15.  Chronic thrombocytopenia.    Follow-up plt 72 K will recheck in am     LOS: 3 days A FACE TO FACE EVALUATION WAS PERFORMED  07/02/21 06/30/2021, 7:25 AM

## 2021-06-30 NOTE — Progress Notes (Signed)
Physical Therapy Session Note  Patient Details  Name: Carlos Welch MRN: [id number] Date of Birth: 18-Jan-1946  Today's Date: 06/30/2021 PT Missed Time: 45 Minutes Missed Time Reason: Patient ill (Comment);Increased agitation;Patient fatigue;Patient unwilling to participate;Pain (Pt reported high levels of nausea and pain, difficult to arouse, agitated)  Short Term Goals: Week 1:  PT Short Term Goal 1 (Week 1): Pt will perform all bed mobility with consistent CGA. PT Short Term Goal 2 (Week 1): Pt will perform dynamic sitting balance tasks with Min A. PT Short Term Goal 3 (Week 1): Pt will perform sit <> stand with Mod A. PT Short Term Goal 4 (Week 1): Pt will initiate gait training with LRAD. PT Short Term Goal 5 (Week 1): Pt will decrease complaint of lightheadedness/ dizziness with mobility by 50%.  Skilled Therapeutic Interventions/Progress Updates:  Pt received semi-reclined in bed, very lethargic and unable to open eyes to look at therapist. Pt reported 9/10 pain "all over", RN made aware, pt unable to report if he had received pain meds. Unable to arouse pt, repeatedly shook his head no any time therapist spoke and became agitated when asked to open eyes. Pt refused therapy, missed 45 minutes of skilled PT. Will attempt to make up time later as schedule allows.   Therapy Documentation Precautions:  Precautions Precautions: Fall Precaution Comments: R hemi, R inattention, decr R sensation, small subluxation of R shoulder, fluctuating agitation, hypertension, lightheadedness/ blurry vision/ nausea with change in position Restrictions Weight Bearing Restrictions: No   Therapy/Group: Individual Therapy Jill Alexanders Kenneth Cuaresma, PT, DPT  06/30/2021, 7:47 AM

## 2021-06-30 NOTE — Progress Notes (Signed)
Occupational Therapy Session Note  Patient Details  Name: Carlos Welch MRN: [id number] Date of Birth: Sep 03, 1946  Today's Date: 06/30/2021 OT Individual Time: 0701-0809 OT Individual Time Calculation (min): 68 min    Short Term Goals: Week 1:  OT Short Term Goal 1 (Week 1): Pt will don shirt with mod A. OT Short Term Goal 2 (Week 1): Pt will don pants with max A.+ LRAD. OT Short Term Goal 3 (Week 1): Pt will tolerate completing ADL routine 2/3 sessions with no more than min VCs to redirect agitation. OT Short Term Goal 4 (Week 1): Pt will maintain static sitting with CGA and no posterior LOB for >5 min in prep for seated ADL.  Skilled Therapeutic Interventions/Progress Updates:    Pt received asleep in bed, awakened to voice, agreeable to therapy. Session focus on self-care retraining, activity tolerance, func transfers in prep for improved ADL/IADL/func mobility performance + decreased caregiver burden. Came to sitting EOB with mod A to lift trunk 2/2 posterior LOB onto bed. With encouragement, agreeable to eat breakfast in reclining back w/c. Stedy transfer > reclining back with mod to max A to prevent severe R lean. Pt able to self-feed with LUE, but req consistent cues for swallowing strategies per SLP sheet 2/2 significant R sided pocketing. Pt self-fed ~25% of breakfast, req assist to alternate sips of liquids/solids.   MD in/out morning assessment. Pt denies pain, endorses R sided weakness/sensory deficits.   Complete oral care with suction sponge, req min A for thoroughness. Pt attempted to brush hair, reporting "I can't feel anything, I can't see." Cleaned glasses lenses , pt reports mild improvement in "blurriness."  Pt endorsing need to have BM "I need to shit" and stating "it hurts but I can't feel it here" (gesturing around abdomen). Requests to use bathroom, but becoming verbally agitated and attempting to leave chair. States he can get to the toilet himself despite education on  safety/mobility deficits at this time. Becomes agitated at sight of stedy despite having just used it to get in w/c. Adamantly declining use of stedy and cursing. +2 present to facilitate deescalation, improves with time. Attempted squat-pivot + 2, but pt with R pushers/R lean. Finally agreeable to use of stedy. Returned to supine and boosted in bed with total A + 2. Noted to immediately fall asleep.   Pt left with HOB at 30 degrees with bed alarm engaged, 4 bed rails up for safety, call bell in reach, and all immediate needs met.    Therapy Documentation Precautions:  Precautions Precautions: Fall Precaution Comments: R hemi, R inattention, decr R sensation, small subluxation of R shoulder, fluctuating agitation, hypertension, lightheadedness/ blurry vision/ nausea with change in position Restrictions Weight Bearing Restrictions: No  Pain:  See session note ADL: See Care Tool for more details.  Therapy/Group: Individual Therapy  [name] MS, OTR/L  06/30/2021, 6:40 AM

## 2021-06-30 NOTE — Progress Notes (Signed)
2409 therapist was unsuccessful with getting patient to awake and participate in therapy, the patient seems to be lethargic, vital signs  taken ,the bp was 70s/50s; PA Dan who was present at the time came in to check on the patoient and assessed his most recent lab work, .45% saline was ordered for 1 liter at 75 ml/hr. The fluid was started at 1018 and has resoved the hypotension for now, the patient bp is currently 109/79 pulse os currently 69. The patient states he is ok and he wishes to just sleep at this time, I have held his hyralazine due at this time r/t the response to the am dose of anti hypertensive, will monitor bp in 1 hour to determine necessity of medication, no s/s of distress will continue to monitor

## 2021-06-30 NOTE — Progress Notes (Signed)
Speech Language Pathology Daily Session Note  Patient Details  Name: Carlos Welch MRN: [id number] Date of Birth: 10/27/1945  Today's Date: 06/30/2021 SLP Individual Time: 1400-1515 SLP Individual Time Calculation (min): 75 min  Short Term Goals: Week 1: SLP Short Term Goal 1 (Week 1): Patient will participate in further cognitive and expressive/receptive language testing to determine functional needs. SLP Short Term Goal 2 (Week 1): Patient will orient x4 with min A verbal cues for use of external aids across 3 consecutive sessions SLP Short Term Goal 3 (Week 1): Patient will demonstrate sustained attention to functional tasks for 5-10 minute intervals with min A verbal redirection. SLP Short Term Goal 4 (Week 1): Patient will complete mildly complex problem solving with mod A verbal cues SLP Short Term Goal 5 (Week 1): Patient will tolerate PO trials of Dys 3 textures with effective mastication and oral clearance prior to diet advancement with min A verbal cues to implement swallow strategies  Skilled Therapeutic Interventions: Patient was received semi-reclined in bed and agreeable to skilled ST intervention with focus on cognitive and swallowing goals. Pt was pleasant and cooperative and exhibited minimal agitation this date. SLP facilitated PO trials of Dys 3 textures. Pt exhibited oral deficits characterized by inefficient mastication with munch chew pattern, mild pocketing in right buccal cavity without awareness, hard palate residue, decreased bolus manipulation, and what appeared to be effortful AP transit. Pt unable to effectively execute lingual sweep to clear residuals therefore SLP utilized clean sponge swab to remove. He exhibited anterior labial loss of ~50% of bolus with nectar thick liquid cup sips without awareness. Patient does not appear ready for diet advancement at this time due to severity of deficits, therefore recommend continuation of Dys 2 diet and nectar thick liquids with  full supervision during meals to implement safe swallow strategies. SLP also facilitated session with administering additional cognitive and language testing using the [facility] Mental Status Exam (SLUMS). Patient obtained a score of 2/24 (omitted writing portions due to hemiparesis impacting dominant hand). He was oriented to year, state [facility] [place]), and situation independent of cues. Patient required min A verbal cues to refer to external orientation aids to orient to place (town, hospital name) and time (day of week, month). He exhibited difficulty in all tested areas which is suspected to poor sustained attention and decreased perceived level of effort/motivation. Patient was only able to sustain attention for 5 minute intervals with mod-to-max redirection to keep eyes open. Patient did not appear to be sleeping when eyes were closed, however closing his eyes appeared to impacted level of focus toward tasks. SLP facilitated further informal language assessment which revealed object naming 95% accurate with only one error (hesitated on "chair"), decreased confrontation naming, decreased response length and thought organization which aligns with initial assessment. He followed one-step directions with 100% accuracy,followed 2-step directions with 57% accuracy, and body part identification with 100% accuracy. Patient was passed off to NSG at end of session. Continue per current plan of care.      Pain Pain Assessment Pain Scale: 0-10 Pain Score: 0-No pain  Therapy/Group: Individual Therapy  Tamala Ser 06/30/2021, 2:55 PM

## 2021-07-01 LAB — BASIC METABOLIC PANEL
Anion gap: 15 (ref 5–15)
BUN: 43 mg/dL — ABNORMAL HIGH (ref 8–23)
CO2: 25 mmol/L (ref 22–32)
Calcium: 9.9 mg/dL (ref 8.9–10.3)
Chloride: 96 mmol/L — ABNORMAL LOW (ref 98–111)
Creatinine, Ser: 1.33 mg/dL — ABNORMAL HIGH (ref 0.61–1.24)
GFR, Estimated: 56 mL/min — ABNORMAL LOW (ref >60)
Glucose, Bld: 99 mg/dL (ref 70–99)
Potassium: 3.9 mmol/L (ref 3.5–5.1)
Sodium: 136 mmol/L (ref 135–145)

## 2021-07-01 LAB — CBC WITH DIFFERENTIAL/PLATELET
Abs Immature Granulocytes: 0.02 10*3/uL (ref 0.00–0.07)
Basophils Absolute: 0.1 10*3/uL (ref 0.0–0.1)
Basophils Relative: 1 %
Eosinophils Absolute: 0.1 10*3/uL (ref 0.0–0.5)
Eosinophils Relative: 1 %
HCT: 49.7 % (ref 39.0–52.0)
Hemoglobin: 16.1 g/dL (ref 13.0–17.0)
Immature Granulocytes: 0 %
Lymphocytes Relative: 13 %
Lymphs Abs: 0.9 10*3/uL (ref 0.7–4.0)
MCH: 28.2 pg (ref 26.0–34.0)
MCHC: 32.4 g/dL (ref 30.0–36.0)
MCV: 87.2 fL (ref 80.0–100.0)
Monocytes Absolute: 0.8 10*3/uL (ref 0.1–1.0)
Monocytes Relative: 11 %
Neutro Abs: 5.3 10*3/uL (ref 1.7–7.7)
Neutrophils Relative %: 74 %
Platelets: 142 10*3/uL — ABNORMAL LOW (ref 150–400)
RBC: 5.7 MIL/uL (ref 4.22–5.81)
RDW: 18.9 % — ABNORMAL HIGH (ref 11.5–15.5)
WBC: 7.2 10*3/uL (ref 4.0–10.5)
nRBC: 0 % (ref 0.0–0.2)

## 2021-07-01 MED ORDER — FLUTICASONE PROPIONATE 50 MCG/ACT NA SUSP
2.0000 | Freq: Every day | NASAL | Status: DC | PRN
  Filled 2021-07-01: qty 16

## 2021-07-01 MED ORDER — FUROSEMIDE 20 MG PO TABS
20.0000 mg | ORAL_TABLET | Freq: Every day | ORAL | Status: DC
  Administered 2021-07-01 – 2021-07-15 (×15): 20 mg via ORAL
  Filled 2021-07-01 (×15): qty 1

## 2021-07-01 MED ORDER — LOSARTAN POTASSIUM 50 MG PO TABS
50.0000 mg | ORAL_TABLET | Freq: Every day | ORAL | Status: DC
  Administered 2021-07-01 – 2021-07-15 (×15): 50 mg via ORAL
  Filled 2021-07-01 (×15): qty 1

## 2021-07-01 MED ORDER — HYDRALAZINE HCL 25 MG PO TABS
25.0000 mg | ORAL_TABLET | Freq: Three times a day (TID) | ORAL | Status: DC
  Administered 2021-07-01 – 2021-07-15 (×42): 25 mg via ORAL
  Filled 2021-07-01 (×42): qty 1

## 2021-07-01 MED ORDER — DOCUSATE SODIUM 100 MG PO CAPS
100.0000 mg | ORAL_CAPSULE | Freq: Every day | ORAL | Status: DC
  Administered 2021-07-01 – 2021-07-15 (×15): 100 mg via ORAL
  Filled 2021-07-01 (×15): qty 1

## 2021-07-01 NOTE — Progress Notes (Addendum)
PROGRESS NOTE   Subjective/Complaints: Had increased lethargy yesterday which responded to IV hydration  Hematuria after urinary catheter insertion , foley left in and draining clear   Labs reviewed ROS- neg CP, SOB, N/V/D , + constip  Objective:   CT HEAD WO CONTRAST ( )  Result Date: 06/30/2021 CLINICAL DATA:  Monocular vision loss. Follow-up intracranial hemorrhage. EXAM: CT HEAD WITHOUT CONTRAST TECHNIQUE: Contiguous axial images were obtained from the base of the skull through the vertex without intravenous contrast. COMPARISON:  MRI 06/24/2021.  CT 06/23/2021. FINDINGS: Brain: No focal abnormality affects the brainstem or cerebellum. Subacute hemorrhage within the left cerebral peduncle, thalamus and adjacent white matter is less distinct, consistent with expected evolutionary change. No evidence of additional bleeding. Surrounding edema as expected. Cerebral hemispheres otherwise show generalized atrophy with chronic small-vessel ischemic changes of the white matter. No large vessel territory infarction. No mass lesion, new hemorrhage, hydrocephalus or extra-axial collection. Vascular: There is atherosclerotic calcification of the major vessels at the base of the brain. Skull: Negative Sinuses/Orbits: Clear/normal Other: None IMPRESSION: Expected evolutionary changes with indistinctness of the hyperdense blood related to the acute hemorrhage in the left cerebral peduncle, thalamus and adjacent white matter tracts. Surrounding vasogenic edema as expected. Extensive chronic small-vessel ischemic changes elsewhere throughout the brain are unchanged. Electronically Signed   By: Paulina Fusi M.D.   On: 06/30/2021 20:14   Recent Labs    06/30/21 0520 07/01/21 0527  WBC 6.3 7.2  HGB 17.0 16.1  HCT 54.0* 49.7  PLT 174 142*    Recent Labs    06/28/21 1154 07/01/21 0527  NA 138 136  K 3.8 3.9  CL 98 96*  CO2 30 25  GLUCOSE 103*  99  BUN 30* 43*  CREATININE 1.33* 1.33*  CALCIUM 9.8 9.9     Intake/Output Summary (Last 24 hours) at 07/01/2021 0729 Last data filed at 07/01/2021 0000 Gross per 24 hour  Intake 2560 ml  Output 500 ml  Net 2060 ml          Physical Exam: Vital Signs Blood pressure 104/82, pulse 69, temperature 98.2 F (36.8 C), temperature source Oral, resp. rate 16, height 6\' 2"  (1.88 m), weight 75.2 kg, SpO2 97 %.  General: No acute distress Mood and affect are appropriate Heart: Regular rate and rhythm no rubs murmurs or extra sounds Lungs: Clear to auscultation, breathing unlabored, no rales or wheezes Abdomen: Positive bowel sounds, soft nontender to palpation, nondistended Extremities: No clubbing, cyanosis, or edema Skin: No evidence of breakdown, no evidence of rash    Neurologic: Cranial nerves II through XII intact, motor strength is 5/5 in left deltoid, bicep, tricep, grip, hip flexor, knee extensors, ankle dorsiflexor and plantar flexor Trace finger flexion on RIght , RLE 2- hip knee ext synergy , 3- Right ankle PF Sensory exam normal sensation to light touch LUE and LLE, winces to pinch RUE not RLE Absent light touch RUE   Musculoskeletal: Full range of motion in all 4 extremities. No joint swelling   Assessment/Plan: 1. Functional deficits which require 3+ hours per day of interdisciplinary therapy in a comprehensive inpatient rehab setting. Physiatrist is providing close team supervision and  24 hour management of active medical problems listed below. Physiatrist and rehab team continue to assess barriers to discharge/monitor patient progress toward functional and medical goals  Care Tool:  Bathing    Body parts bathed by patient: Right arm, Chest, Abdomen, Right upper leg, Left upper leg   Body parts bathed by helper: Left arm, Front perineal area, Buttocks, Right lower leg, Left lower leg     Bathing assist Assist Level: Total Assistance - Patient < 25%      Upper Body Dressing/Undressing Upper body dressing   What is the patient wearing?: Pull over shirt    Upper body assist Assist Level: Maximal Assistance - Patient 25 - 49%    Lower Body Dressing/Undressing Lower body dressing      What is the patient wearing?: Incontinence brief, Pants     Lower body assist Assist for lower body dressing: 2 Helpers     Toileting Toileting    Toileting assist Assist for toileting: 2 Helpers     Transfers Chair/bed transfer  Transfers assist  Chair/bed transfer activity did not occur: Safety/medical concerns        Locomotion Ambulation   Ambulation assist   Ambulation activity did not occur: Safety/medical concerns          Walk 10 feet activity   Assist  Walk 10 feet activity did not occur: Safety/medical concerns        Walk 50 feet activity   Assist Walk 50 feet with 2 turns activity did not occur: Safety/medical concerns         Walk 150 feet activity   Assist Walk 150 feet activity did not occur: Safety/medical concerns         Walk 10 feet on uneven surface  activity   Assist Walk 10 feet on uneven surfaces activity did not occur: Safety/medical concerns         Wheelchair     Assist Is the patient using a wheelchair?: Yes (Per PT long term goals) Type of Wheelchair: Manual Wheelchair activity did not occur: Safety/medical concerns         Wheelchair 50 feet with 2 turns activity    Assist    Wheelchair 50 feet with 2 turns activity did not occur: Safety/medical concerns       Wheelchair 150 feet activity     Assist  Wheelchair 150 feet activity did not occur: Safety/medical concerns       Blood pressure 104/82, pulse 69, temperature 98.2 F (36.8 C), temperature source Oral, resp. rate 16, height 6\' 2"  (1.88 m), weight 75.2 kg, SpO2 97 %.  Medical Problem List and Plan: 1.  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  Antithrombotics: -DVT/anticoagulation:  Mechanical: Sequential compression devices, below knee Bilateral lower extremities             -antiplatelet therapy: N/A 3. Pain: continue Neurontin 100 mg nightly, hydrocodone as needed 4. Mood: Provide emotional support             -antipsychotic agents: N/A 5. Neuropsych: This patient is capable of making decisions on his own behalf. 6. Skin/Wound Care: Routine skin checks- add lac hydrin  7. Fluids/Electrolytes/Nutrition: Routine in and outs with follow-up chemistries, poor intake on megestrol  8.  Dysphagia.  Dysphagia #  2 nectar thick liquids.  Follow-up speech therapy, poor intake due to lethargy now on appetite stimulant and neurostimulant  9.  Hypertension.  Continue Lasix 80 mg daily, hydralazine 50 mg every 8 hours, Cozaar 100 mg daily.  Monitor with increased mobility Vitals:   07/01/21 0500 07/01/21 0533  BP: 104/82 104/82  Pulse: 69   Resp: 16   Temp: 98.2 F (36.8 C)   SpO2: 97%   BP improving but was low yesterday likely due to poor intake will reduce lasix dose again, enc po fluids, reduce hydralazine and losartan dose until intake picks up   10.  Combined congestive heart failure.  Lasix 80 mg daily.  looks dry , BUN rising up to 43 no edema will reduce lasix from 40mg  to 20mg   Elevated BUN likely due to diuretic use  11.  Chronic bilateral lower extremity edema/cellulitis.  Continue Lasix.  Complete course of doxycycline 12.  Atrial fibrillation.  Eliquis held due to ICH.  Cardiac rate controlled 13.  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  Follow-up cardiology services 14.  Hyperlipidemia.  Currently holding statin due to ICH 15.  Chronic thrombocytopenia.  Mild   Follow-up plt 142 K   16.  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  She shared that the patient has been noncompliant with his medical care and has checked AMA several times from hospitals.  He was severely deconditioned after CHF exacerbation and stayed in a skilled nursing facility for about 6 months in 2020.  He has been with his daughter at her home for about the last year. LOS: 4 days A FACE TO FACE EVALUATION WAS PERFORMED  10/16 07/01/2021, 7:29 AM

## 2021-07-01 NOTE — Progress Notes (Addendum)
Occupational Therapy Session Note  Patient Details  Name: Carlos Welch MRN: [id number] Date of Birth: 24-Oct-1945  Today's Date: 07/01/2021 OT Individual Time: 0703-0800 OT Individual Time Calculation (min): 23 min + 68 min   Short Term Goals: Week 1:  OT Short Term Goal 1 (Week 1): Pt will don shirt with mod A. OT Short Term Goal 2 (Week 1): Pt will don pants with max A.+ LRAD. OT Short Term Goal 3 (Week 1): Pt will tolerate completing ADL routine 2/3 sessions with no more than min VCs to redirect agitation. OT Short Term Goal 4 (Week 1): Pt will maintain static sitting with CGA and no posterior LOB for >5 min in prep for seated ADL.  Skilled Therapeutic Interventions/Progress Updates:    Session 1 [phone]): Pt received with HOB at 25 degrees, awakened to  voice easily, agreeable to bed level therapy. Session focus on self-care retraining, activity tolerance, therapeutic rapport building, family education in prep for improved ADL/IADL/func mobility performance + decreased caregiver burden. BP in semi-reclined position at 90/75 (80). Donned B teds per order total A bed level. Pt c/o discomfort 2/2 catheter, perseverating on staff to "unmake" the discomfort/cath. Unable to specify if pain/discomfort. Cont to c/o R sided numbness. MD and RN in/out morning assessment.   Attempted to encourage pt to sit EOB for breakfast, pt adamantly declining, demanding to eat/be positioned in bed. Total A + 2 to boost up, pt against having BLE  lowered for chair position. Self-fed ~20% of breakfast with LUE, req assist to alternate between sips and consistent cues for swallowing strategies per SLP sheet. Moderate R pocketing noted req use of suctioning. Completed oral care with suction sponge with min to mod A for thoroughness. Noted to occasionally refer to items on R side of plate, but did not attempt to eat himself. Moderate spillage noted as well.  Called daughter at bedside to confirm baseline ADL  performance/behavior. Daughter Carlos Welch endorses that pt was mod I with BADL, she managed IADL. Reports history of noncompliance and leaving AMA during prior hospitalizations. Did not participate in PT previously, but did spend 6 months at a "rehab facility" and did progress to mod I level. Endorses that pt is quick to anger at baseline and has history of using racial slurs against medical staff. Requests that staff enforce education about importance of therapy given that she will be unable to provide S consistently as she travels for work. Will have to transfer to LTC if unable to progress to mod I. Shared conversation with pt and implications of not participating in therapy, not receptive at this time.  BP in chair position read at 112/79 (91) at end of session.  Pt left with HOB over 30 degrees with bed alarm engaged, call bell in reach, and all immediate needs met. 4 bed rails up for safety.   Session 2 [phone]) : Pt received semi-reclined in bed, cont to c/o inability to have BM/discomfort in abdomen, agreeable to bed level therapy. Session focus on self-care retraining, activity tolerance, func transfers, RUE NMR in prep for improved ADL/IADL/func mobility performance + decreased caregiver burden. Came to sitting EOB with max A to progress BLE off bed + lift trunk. Improved static sitting balance this date with only 1 LOB to the R. Doffed hospital gown/donned shirt with total A. Pt able to maintain balance to assist, no agitation. Bathed UB with max A to bathe RUE/overall thoroughness. Able to wash face with min A for thoroughness. Pt immediately returning  to supine despite encouragement to sit up. Max A to readjust trunk/BLE in bed.  Semi-reclined in bed, competed 1x10 of the following with RUE: sponge squeezes, shoulder flexion, biceps curls, wrist extensions.  Cont education on importance of OOB activity/therapy participation for return to PLOF.   Pt left with HOB at 30 degrees with bed alarm  engaged, call bell in reach, and all immediate needs met. 4 bed rails up for safety.   Therapy Documentation Precautions:  Precautions Precautions: Fall Precaution Comments: R hemi, R inattention, decr R sensation, small subluxation of R shoulder, fluctuating agitation, hypertension, lightheadedness/ blurry vision/ nausea with change in position Restrictions Weight Bearing Restrictions: No  Pain: see session notes ADL: See Care Tool for more details.  Therapy/Group: Individual Therapy  [name] MS, OTR/L  07/01/2021, 6:37 AM

## 2021-07-01 NOTE — Progress Notes (Signed)
Speech Language Pathology Daily Session Note  Patient Details  Name: Carlos Welch MRN: [id number] Date of Birth: [birth year]  Today's Date: 07/01/2021 SLP Individual Time: 2694-8546 SLP Individual Time Calculation (min): 10 min  Short Term Goals: Week 1: SLP Short Term Goal 1 (Week 1): Patient will participate in further cognitive and expressive/receptive language testing to determine functional needs. SLP Short Term Goal 2 (Week 1): Patient will orient x4 with min A verbal cues for use of external aids across 3 consecutive sessions SLP Short Term Goal 3 (Week 1): Patient will demonstrate sustained attention to functional tasks for 5-10 minute intervals with min A verbal redirection. SLP Short Term Goal 4 (Week 1): Patient will complete mildly complex problem solving with mod A verbal cues SLP Short Term Goal 5 (Week 1): Patient will tolerate PO trials of Dys 3 textures with effective mastication and oral clearance prior to diet advancement with min A verbal cues to implement swallow strategies  Skilled Therapeutic Interventions:Skilled ST services focused on cognitive skills. Pt was asleep upon entering room requiring verbal and tactile cues to gain alertness however never opening eyes. Pt was agreeable to cold compress and able to verbalize at phrase level " I can't" when asked to open his eyes. SLP provided education pertaining to need for skilled ST services. SLP will attempt to make up missed time later today if able. Pt was left in room with call bell within reach and bed alarm set. SLP recommends to continue skilled services.  SLP attempted to see pt later in the day however PT services was attempting to work with pt. Pt missed 20 minute of skilled ST services due to fatigue.      Pain Pain Assessment Pain Score: 0-No pain  Therapy/Group: Individual Therapy  Yukiko Minnich  [facility] 07/01/2021, 2:08 PM

## 2021-07-01 NOTE — Progress Notes (Signed)
Resident alert with clear speech and following verbal commands. No lethargy noted Foley catheter patent and below bladder level draining clear yellow/straw urine this time.

## 2021-07-01 NOTE — Progress Notes (Signed)
Physical Therapy Session Note  Patient Details  Name: Carlos Welch MRN: [id number] Date of Birth: 10/23/1945  Today's Date: 07/01/2021 PT Individual Time: 1102-1158 PT Individual Time Calculation (min): 56 min   Short Term Goals: Week 1:  PT Short Term Goal 1 (Week 1): Pt will perform all bed mobility with consistent CGA. PT Short Term Goal 2 (Week 1): Pt will perform dynamic sitting balance tasks with Min A. PT Short Term Goal 3 (Week 1): Pt will perform sit <> stand with Mod A. PT Short Term Goal 4 (Week 1): Pt will initiate gait training with LRAD. PT Short Term Goal 5 (Week 1): Pt will decrease complaint of lightheadedness/ dizziness with mobility by 50%.  Skilled Therapeutic Interventions/Progress Updates:  Patient supine in bed, asleep on entrance to room. Patient requires time to fully rouse and become alert. Then is agreeable to PT session. Relates inability to get full night's sleep and consistently feels fatigued and sleepy.   Patient with no pain complaint during session, but does relate brief instances of lightheadedness, nausea on reaching seated position on EOB that dissipate until end of standing bouts when pt requests to return to supine.    Therapeutic Activity: Bed Mobility: Patient's bed pulled from wall in order for pt to perform supine to sit toward L side. Pt performs supine --> sit with CGA and vc/ tc for technique and hand/ LE placement/ progression. Is able to come to near square position on EOB requiring Min A to fully complete to rest Bil feet on floor. Upright sitting balance maintained with supervision with one instance of near LOB requiring CGA to maintain. To return to supine, pt requires Min A for BLE and Min A from +2 for UB to bed surface. Mod A +2 for positioning toward HOB.  Transfers: Patient performed sit<>stand to RW with MinA +2 on first attempt and Mod A +2 on subsequent attempts. Pt is able to maintain standing for almost each bout. Requires  Min A to bring R hand to AE on grip. For NMR, during second standing bout, pt guided in lateral weight shifting and in funal standing bout, pt able to lift R foot from floor for RLE march x8. Provided verbal cues for technique and weight shift. NMR performed for improvements in motor control and coordination, balance, sequencing, judgement, and self confidence/ efficacy in performing all aspects of mobility at highest level of independence.   Patient supine  in bed at end of session with brakes locked, bed alarm set, and all needs within reach. Rarm elevated on pillow with thermotherapy pad placed in towel inside R hand for improved circulation.      Therapy Documentation Precautions:  Precautions Precautions: Fall Precaution Comments: R hemi, R inattention, decr R sensation, small subluxation of R shoulder, fluctuating agitation, hypertension, lightheadedness/ blurry vision/ nausea with change in position Restrictions Weight Bearing Restrictions: No General:   Pain: Pain Assessment Pain Scale: 0-10 Pain Score: 0-No pain  Therapy/Group: Individual Therapy  Loel Dubonnet PT, DPT 07/01/2021, 12:15 AM

## 2021-07-02 DIAGNOSIS — I10 Essential (primary) hypertension: Secondary | ICD-10-CM

## 2021-07-02 DIAGNOSIS — D696 Thrombocytopenia, unspecified: Secondary | ICD-10-CM

## 2021-07-02 DIAGNOSIS — I69391 Dysphagia following cerebral infarction: Secondary | ICD-10-CM

## 2021-07-02 MED ORDER — POLYETHYLENE GLYCOL 3350 17 G PO PACK
17.0000 g | PACK | Freq: Two times a day (BID) | ORAL | Status: DC
  Administered 2021-07-02 – 2021-07-15 (×27): 17 g via ORAL
  Filled 2021-07-02 (×27): qty 1

## 2021-07-02 MED ORDER — SENNOSIDES-DOCUSATE SODIUM 8.6-50 MG PO TABS
1.0000 | ORAL_TABLET | Freq: Every day | ORAL | Status: DC
  Administered 2021-07-02 – 2021-07-14 (×13): 1 via ORAL
  Filled 2021-07-02 (×13): qty 1

## 2021-07-02 MED ORDER — BISACODYL 10 MG RE SUPP
10.0000 mg | Freq: Every day | RECTAL | Status: DC | PRN
  Administered 2021-07-02: 10 mg via RECTAL
  Filled 2021-07-02: qty 1

## 2021-07-02 NOTE — Progress Notes (Signed)
Speech Language Pathology Daily Session Note  Patient Details  Name: Carlos Welch MRN: [id number] Date of Birth: 1946/01/19  Today's Date: 07/02/2021 SLP Individual Time: 3536-1443 SLP Individual Time Calculation (min): 45 min  Short Term Goals: Week 1: SLP Short Term Goal 1 (Week 1): Patient will participate in further cognitive and expressive/receptive language testing to determine functional needs. SLP Short Term Goal 2 (Week 1): Patient will orient x4 with min A verbal cues for use of external aids across 3 consecutive sessions SLP Short Term Goal 3 (Week 1): Patient will demonstrate sustained attention to functional tasks for 5-10 minute intervals with min A verbal redirection. SLP Short Term Goal 4 (Week 1): Patient will complete mildly complex problem solving with mod A verbal cues SLP Short Term Goal 5 (Week 1): Patient will tolerate PO trials of Dys 3 textures with effective mastication and oral clearance prior to diet advancement with min A verbal cues to implement swallow strategies  Skilled Therapeutic Interventions: Pt seen for skilled ST with focus on dysphagia and cognitive goals. Pt appeared to have eaten 100% Dys 2 meal, agreeable to a few final sips of NTL with SLP but denying further solid intake. Pt provided cup, not agreeable to assist from SLP. Pt with decreased awareness and self-control of cup sip, at times taking large sips. No overt s/s aspiration or change in vocal quality across 2 oz NTL. Recommend continuing Dys 2/NTL diet d/t cognitive impairments and decreased self-monitoring. SLP facilitating sequencing and problem solving for functional self-care tasks by providing mod A verbal cues. Pt requesting more adhesive for bottom dentures, SLP cleaning bottom dentures and applying adhesive, pt did become agitated stating "you can't do it right". Pt fatiguing toward end of session, requiring rest breaks to increase participation and decrease agitation. Pt left in bed with RN  present for med administration. Cont ST POC.   Pain Pain Assessment Pain Scale: 0-10 Pain Score: 0-No pain  Therapy/Group: Individual Therapy  Tacey Ruiz 07/02/2021, 8:21 AM

## 2021-07-02 NOTE — Progress Notes (Signed)
Occupational Therapy Session Note  Patient Details  Name: Carlos Welch MRN: [id number] Date of Birth: Feb 19, 1946  Today's Date: 07/02/2021 OT Individual Time: 6644-0347 OT Individual Time Calculation (min): 31 min  and Today's Date: 07/02/2021 OT Missed Time: 30 Minutes Missed Time Reason: Patient unwilling/refused to participate without medical reason;Pain   Short Term Goals: Week 1:  OT Short Term Goal 1 (Week 1): Pt will don shirt with mod A. OT Short Term Goal 2 (Week 1): Pt will don pants with max A.+ LRAD. OT Short Term Goal 3 (Week 1): Pt will tolerate completing ADL routine 2/3 sessions with no more than min VCs to redirect agitation. OT Short Term Goal 4 (Week 1): Pt will maintain static sitting with CGA and no posterior LOB for >5 min in prep for seated ADL.  Skilled Therapeutic Interventions/Progress Updates:    Pt received asleep in bed, awakened to voice, initially agreeable to therapy. Session focus on self-care retraining, activity tolerance, RUE NMR, func transfers in prep for improved ADL/IADL/func mobility performance + decreased caregiver burden. Came to sitting EOB with mod A > pt's L side. Required assist to progress RUE/RLE and to scoot forward. Pt c/o ongoing abdominal pain secondary to being unable to have BM. Did not rate, but endorses significant pain. Upon sitting EOB, pt becoming agitated at sight of stedy and offer to transfer to toilet. Yells "I need to lay back down." Required total A + 2 to boost up in bed 2/2 agitation and pt being unable to redirect to assist. Donned B teds with total A at bed level, pt is able to assist by lifting BLE slightly. Pt cont to be agitated, telling therapist to "go home" and that staff "does nothing right." Continue to educated on importance of OOB mobility for return to PLOF and to assist with BM. Pt not receptive to education at this time. Discussed daughter's expectations, as well for pt to participate in therapy. He stated "[facility] can  go to [facility]."  Pt does demonstrated decreased agitation when given rest breaks. Completed 1x10 soft sponge squeezes and biceps curls with level 1 resistive band with RUE before becoming tearful/agitated again due to pain. RN notified.    Pt left with HOB over 30 degrees with bed  alarm engaged, call bell in reach, and all immediate needs met. 4 bed rails up for safety. Pt missed 30 min of OT due to agitation/pain/refusal.    Therapy Documentation Precautions:  Precautions Precautions: Fall Precaution Comments: R hemi, R inattention, decr R sensation, small subluxation of R shoulder, fluctuating agitation, hypertension, lightheadedness/ blurry vision/ nausea with change in position Restrictions Weight Bearing Restrictions: No  Pain: ongoing abdominal pain, did not rate   ADL: See Care Tool for more details.   Therapy/Group: Individual Therapy  [name] MS, OTR/L  07/02/2021, 6:39 AM

## 2021-07-02 NOTE — Progress Notes (Addendum)
PROGRESS NOTE   Subjective/Complaints: Patient seen sitting up in bed this morning.  He believes he slept well overnight.  He denies complaints.  ROS: Denies CP, SOB, N/V/D  Objective:   CT HEAD WO CONTRAST ( )  Result Date: 06/30/2021 CLINICAL DATA:  Monocular vision loss. Follow-up intracranial hemorrhage. EXAM: CT HEAD WITHOUT CONTRAST TECHNIQUE: Contiguous axial images were obtained from the base of the skull through the vertex without intravenous contrast. COMPARISON:  MRI 06/24/2021.  CT 06/23/2021. FINDINGS: Brain: No focal abnormality affects the brainstem or cerebellum. Subacute hemorrhage within the left cerebral peduncle, thalamus and adjacent white matter is less distinct, consistent with expected evolutionary change. No evidence of additional bleeding. Surrounding edema as expected. Cerebral hemispheres otherwise show generalized atrophy with chronic small-vessel ischemic changes of the white matter. No large vessel territory infarction. No mass lesion, new hemorrhage, hydrocephalus or extra-axial collection. Vascular: There is atherosclerotic calcification of the major vessels at the base of the brain. Skull: Negative Sinuses/Orbits: Clear/normal Other: None IMPRESSION: Expected evolutionary changes with indistinctness of the hyperdense blood related to the acute hemorrhage in the left cerebral peduncle, thalamus and adjacent white matter tracts. Surrounding vasogenic edema as expected. Extensive chronic small-vessel ischemic changes elsewhere throughout the brain are unchanged. Electronically Signed   By: Paulina Fusi M.D.   On: 06/30/2021 20:14   Recent Labs    06/30/21 0520 07/01/21 0527  WBC 6.3 7.2  HGB 17.0 16.1  HCT 54.0* 49.7  PLT 174 142*    Recent Labs    07/01/21 0527  NA 136  K 3.9  CL 96*  CO2 25  GLUCOSE 99  BUN 43*  CREATININE 1.33*  CALCIUM 9.9     Intake/Output Summary (Last 24 hours) at  07/02/2021 0814 Last data filed at 07/02/2021 0752 Gross per 24 hour  Intake 948 ml  Output 1600 ml  Net -652 ml          Physical Exam: Vital Signs Blood pressure 107/75, pulse 66, temperature 98.4 F (36.9 C), resp. rate 14, height 6\' 2"  (1.88 m), weight 75.2 kg, SpO2 100 %. Constitutional: No distress . Vital signs reviewed. HENT: Normocephalic.  Atraumatic. Eyes: Left eye ptosis. No discharge. Cardiovascular: No JVD.  RRR. Respiratory: Normal effort.  No stridor.  Bilateral clear to auscultation. GI: Non-distended.  BS +. Skin: Warm and dry.  Intact. Psych: Normal mood.  Normal behavior. Musc: No edema in extremities.  No tenderness in extremities. Neurologic: Alert Motor: 5/5 in left deltoid, bicep, tricep, grip, hip flexor, knee extensors, ankle dorsiflexor and plantar flexor Right upper extremity: Shoulder abduction, elbow flexion/extension to -/5, handgrip 3/5 RLE 2- hip knee ext synergy , 3- Right ankle PF Sensation diminished to light touch right side  Assessment/Plan: 1. Functional deficits which require 3+ hours per day of interdisciplinary therapy in a comprehensive inpatient rehab setting. Physiatrist is providing close team supervision and 24 hour management of active medical problems listed below. Physiatrist and rehab team continue to assess barriers to discharge/monitor patient progress toward functional and medical goals  Care Tool:  Bathing    Body parts bathed by patient: Right arm, Chest, Abdomen, Right upper leg, Left upper leg  Body parts bathed by helper: Left arm, Front perineal area, Buttocks, Right lower leg, Left lower leg     Bathing assist Assist Level: Total Assistance - Patient < 25%     Upper Body Dressing/Undressing Upper body dressing   What is the patient wearing?: Pull over shirt    Upper body assist Assist Level: Maximal Assistance - Patient 25 - 49%    Lower Body Dressing/Undressing Lower body dressing      What is the  patient wearing?: Incontinence brief, Pants     Lower body assist Assist for lower body dressing: 2 Helpers     Toileting Toileting    Toileting assist Assist for toileting: 2 Helpers     Transfers Chair/bed transfer  Transfers assist  Chair/bed transfer activity did not occur: Safety/medical concerns        Locomotion Ambulation   Ambulation assist   Ambulation activity did not occur: Safety/medical concerns          Walk 10 feet activity   Assist  Walk 10 feet activity did not occur: Safety/medical concerns        Walk 50 feet activity   Assist Walk 50 feet with 2 turns activity did not occur: Safety/medical concerns         Walk 150 feet activity   Assist Walk 150 feet activity did not occur: Safety/medical concerns         Walk 10 feet on uneven surface  activity   Assist Walk 10 feet on uneven surfaces activity did not occur: Safety/medical concerns         Wheelchair     Assist Is the patient using a wheelchair?: Yes (Per PT long term goals) Type of Wheelchair: Manual Wheelchair activity did not occur: Safety/medical concerns         Wheelchair 50 feet with 2 turns activity    Assist    Wheelchair 50 feet with 2 turns activity did not occur: Safety/medical concerns       Wheelchair 150 feet activity     Assist  Wheelchair 150 feet activity did not occur: Safety/medical concerns       Blood pressure 107/75, pulse 66, temperature 98.4 F (36.9 C), resp. rate 14, height 6\' 2"  (1.88 m), weight 75.2 kg, SpO2 100 %.  Medical Problem List and Plan: 1.  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  Antithrombotics: -DVT/anticoagulation:  Mechanical: Sequential compression devices, below knee Bilateral lower extremities             -antiplatelet therapy: N/A 3. Pain: continue Neurontin 100 mg nightly,  hydrocodone as needed  Controlled on 9/17 4. Mood: Provide emotional support             -antipsychotic agents: N/A 5. Neuropsych: This patient is capable of making decisions on his own behalf. 6. Skin/Wound Care: Routine skin checks- added lac hydrin  7. Fluids/Electrolytes/Nutrition: Routine in and outs Continue megestrol  8.  Post stroke dysphagia.  Dysphagia #2 nectar thick liquids.  Follow-up speech therapy, poor intake due to lethargy now on appetite stimulant and neurostimulant   Advance diet as tolerated 9.  Hypertension.  Hydralazine 50 mg every 8 hours, Cozaar 100 mg daily.  Monitor with increased mobility Vitals:   07/01/21 2032 07/02/21 0424  BP: 104/76 107/75  Pulse: 65 66  Resp: 16 14  Temp: 98.2 F (36.8 C) 98.4 F (36.9 C)  SpO2: 100% 100%  Lasix, hydralazine, Cozaar decreased  Relatively soft on 9/17, recent changes in medications 10.  Combined congestive heart failure.  Lasix decreased from 40mg  to 20mg    Labs ordered for Monday 11.  Chronic bilateral lower extremity edema/cellulitis.  Continue Lasix.  Complete course of doxycycline 12.  Atrial fibrillation.  Eliquis held due to ICH.  Cardiac rate controlled 13.  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  Follow-up cardiology services 14.  Hyperlipidemia.  Currently holding statin due to ICH 15.  Chronic thrombocytopenia.    Platelets 142 on 9/16 16.  Lethargy: Multifactorial  Repeat CT showed no new bleed or infarct, CBC normal and afebrile to doubt infection, Thalamic strokes often cause problems with arousal Methylphenidate initiated  LOS: 5 days A FACE TO FACE EVALUATION WAS PERFORMED  Jerrie Schussler Tuesday 07/02/2021, 8:14 AM

## 2021-07-02 NOTE — Progress Notes (Signed)
Physical Therapy Session Note  Patient Details  Name: Carlos Welch MRN: [id number] Date of Birth: [birth year]  Today's Date: 07/02/2021 PT Individual Time: 0165-8006 PT Individual Time Calculation (min): 53 min   Short Term Goals: Week 1:  PT Short Term Goal 1 (Week 1): Pt will perform all bed mobility with consistent CGA. PT Short Term Goal 2 (Week 1): Pt will perform dynamic sitting balance tasks with Min A. PT Short Term Goal 3 (Week 1): Pt will perform sit <> stand with Mod A. PT Short Term Goal 4 (Week 1): Pt will initiate gait training with LRAD. PT Short Term Goal 5 (Week 1): Pt will decrease complaint of lightheadedness/ dizziness with mobility by 50%.   Skilled Therapeutic Interventions/Progress Updates:   Pt received supine in bed and agreeable to PT. Supine>sit transfer with min assist and moderate cues for sequencing on the L side of the bed. Sit<>stand from EOB to attempt to don pants. Pt reports bowel incontinence and returned to supine with mod assist for positioning in bed. PT Performed pericre in bed with rolling R and L with min assist only for use of RUE. Supine>sit with min assist. Sit<>stand from EOB  x 3 with mod assist for RLE blocked. Pt instructed in pregait stepping task x 5 BLE with mod assist overall and increasing instruction to improve midline orientation to prevent R lateral LOB. Pt then reports additional incontinent BM. Returned to supine with mod assist. Rolling R and L with min assist as listed for PT to perform pericare and clothing management. Pt then requesting to remain in bed due to extreme fatigue. Left supine in bed with  call bell in reach and all needs met.       Therapy Documentation Precautions:  Precautions Precautions: Fall Precaution Comments: R hemi, R inattention, decr R sensation, small subluxation of R shoulder, fluctuating agitation, hypertension, lightheadedness/ blurry vision/ nausea with change in position Restrictions Weight  Bearing Restrictions: No General: PT Amount of Missed Time (min): 22 Minutes Vital Signs: Therapy Vitals Temp: 98.2 F (36.8 C) Temp Source: Oral Pulse Rate: (!) 59 Resp: 12 BP: 132/88 Patient Position (if appropriate): Lying Oxygen Therapy SpO2: 100 % O2 Device: Room Air Pain: Pain Assessment Pain Scale: 0-10 Pain Score: 5  Pain Location: Generalized Pain Orientation: Right Pain Descriptors / Indicators: Numbness      Therapy/Group: Individual Therapy  Lorie Phenix 07/02/2021, 5:29 PM

## 2021-07-03 DIAGNOSIS — I5042 Chronic combined systolic (congestive) and diastolic (congestive) heart failure: Secondary | ICD-10-CM

## 2021-07-03 NOTE — Progress Notes (Signed)
PROGRESS NOTE   Subjective/Complaints: Patient seen sitting up in bed this morning.  He states he slept well overnight.  He still sleepy this morning.  ROS: Denies CP, SOB, N/V/D  Objective:   No results found. Recent Labs    07/01/21 0527  WBC 7.2  HGB 16.1  HCT 49.7  PLT 142*    Recent Labs    07/01/21 0527  NA 136  K 3.9  CL 96*  CO2 25  GLUCOSE 99  BUN 43*  CREATININE 1.33*  CALCIUM 9.9     Intake/Output Summary (Last 24 hours) at 07/03/2021 0940 Last data filed at 07/03/2021 0807 Gross per 24 hour  Intake 354 ml  Output 750 ml  Net -396 ml          Physical Exam: Vital Signs Blood pressure (!) 136/93, pulse 60, temperature 98.3 F (36.8 C), resp. rate 14, height 6\' 2"  (1.88 m), weight 75.2 kg, SpO2 98 %. Constitutional: No distress . Vital signs reviewed. HENT: Normocephalic.  Atraumatic. Eyes: EOMI. No discharge. Cardiovascular: No JVD.  RRR. Respiratory: Normal effort.  No stridor.  Bilateral clear to auscultation. GI: Non-distended.  BS +. Skin: Warm and dry.  Intact. Psych: Normal mood.  Normal behavior. Musc: No edema in extremities.  No tenderness in extremities. Neurologic: Alert Motor: 5/5 in left deltoid, bicep, tricep, grip, hip flexor, knee extensors, ankle dorsiflexor and plantar flexor Right upper extremity: Shoulder abduction, elbow flexion/extension 2-/5, handgrip 3/5 RLE 2- hip knee ext synergy , 3- Right ankle PF Sensation diminished to light touch right side  Assessment/Plan: 1. Functional deficits which require 3+ hours per day of interdisciplinary therapy in a comprehensive inpatient rehab setting. Physiatrist is providing close team supervision and 24 hour management of active medical problems listed below. Physiatrist and rehab team continue to assess barriers to discharge/monitor patient progress toward functional and medical goals  Care Tool:  Bathing    Body  parts bathed by patient: Right arm, Chest, Abdomen, Right upper leg, Left upper leg   Body parts bathed by helper: Left arm, Front perineal area, Buttocks, Right lower leg, Left lower leg     Bathing assist Assist Level: Total Assistance - Patient < 25%     Upper Body Dressing/Undressing Upper body dressing   What is the patient wearing?: Pull over shirt    Upper body assist Assist Level: Maximal Assistance - Patient 25 - 49%    Lower Body Dressing/Undressing Lower body dressing      What is the patient wearing?: Incontinence brief, Pants     Lower body assist Assist for lower body dressing: 2 Helpers     Toileting Toileting    Toileting assist Assist for toileting: 2 Helpers     Transfers Chair/bed transfer  Transfers assist  Chair/bed transfer activity did not occur: Safety/medical concerns        Locomotion Ambulation   Ambulation assist   Ambulation activity did not occur: Safety/medical concerns          Walk 10 feet activity   Assist  Walk 10 feet activity did not occur: Safety/medical concerns        Walk 50 feet activity  Assist Walk 50 feet with 2 turns activity did not occur: Safety/medical concerns         Walk 150 feet activity   Assist Walk 150 feet activity did not occur: Safety/medical concerns         Walk 10 feet on uneven surface  activity   Assist Walk 10 feet on uneven surfaces activity did not occur: Safety/medical concerns         Wheelchair     Assist Is the patient using a wheelchair?: Yes (Per PT long term goals) Type of Wheelchair: Manual Wheelchair activity did not occur: Safety/medical concerns         Wheelchair 50 feet with 2 turns activity    Assist    Wheelchair 50 feet with 2 turns activity did not occur: Safety/medical concerns       Wheelchair 150 feet activity     Assist  Wheelchair 150 feet activity did not occur: Safety/medical concerns       Blood pressure  (!) 136/93, pulse 60, temperature 98.3 F (36.8 C), resp. rate 14, height 6\' 2"  (1.88 m), weight 75.2 kg, SpO2 98 %.  Medical Problem List and Plan: 1.  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  Antithrombotics: -DVT/anticoagulation:  Mechanical: Sequential compression devices, below knee Bilateral lower extremities             -antiplatelet therapy: N/A 3. Pain: continue Neurontin 100 mg nightly, hydrocodone as needed  Controlled on 9/18 4. Mood: Provide emotional support             -antipsychotic agents: N/A 5. Neuropsych: This patient is capable of making decisions on his own behalf. 6. Skin/Wound Care: Routine skin checks- added lac hydrin  7. Fluids/Electrolytes/Nutrition: Routine in and outs Continue megestrol  8.  Post stroke dysphagia.  Dysphagia #2 nectar thick liquids.  Follow-up speech therapy, poor intake due to lethargy now on appetite stimulant and neurostimulant   Advance diet as tolerated 9.  Hypertension.  Hydralazine 50 mg every 8 hours, Cozaar 100 mg daily.  Monitor with increased mobility Vitals:   07/02/21 1931 07/03/21 0505  BP: 126/86 (!) 136/93  Pulse: (!) 56 60  Resp: 17 14  Temp: 98.4 F (36.9 C) 98.3 F (36.8 C)  SpO2: 100% 98%  Lasix, hydralazine, Cozaar decreased  Relatively controlled on 9/18 10.  Combined congestive heart failure.  Lasix decreased from 40mg  to 20mg    Labs ordered for tomorrow 11.  Chronic bilateral lower extremity edema/cellulitis.  Continue Lasix.  Complete course of doxycycline 12.  Atrial fibrillation.  Eliquis held due to ICH.  Cardiac rate controlled 13.  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  Follow-up cardiology services 14.  Hyperlipidemia.  Currently holding statin due to ICH 15.  Chronic thrombocytopenia.    Platelets 142 on 9/16 16.  Lethargy: Multifactorial  Repeat CT showed no new  bleed or infarct, CBC normal and afebrile to doubt infection, Thalamic strokes often cause problems with arousal Methylphenidate initiated  LOS: 6 days A FACE TO FACE EVALUATION WAS PERFORMED  Carlos Welch 07/03/2021, 9:40 AM

## 2021-07-03 NOTE — Plan of Care (Signed)
  Problem: RH BLADDER ELIMINATION Goal: RH STG MANAGE BLADDER WITH ASSISTANCE Description: STG Manage Bladder With min Assistance Outcome: Not Progressing; foley cath   Problem: RH KNOWLEDGE DEFICIT Goal: RH STG INCREASE KNOWLEDGE OF HYPERTENSION Description: Patient's family will be able to manage HTN with medications and dietary modifications using handouts and educational materials independently Outcome: Not Progressing; some confusion; sleeps most of the time but arousable Goal: RH STG INCREASE KNOWLEDGE OF DYSPHAGIA/FLUID INTAKE Description: Patient's family will be able to manage dysphagia; medications and dietary modifications using handouts and educational materials independently Outcome: Not Progressing Goal: RH STG INCREASE KNOWLEGDE OF HYPERLIPIDEMIA Description: Patient's family will be able to manage HLD  with medications and dietary modifications using handouts and educational materials independently Outcome: Not Progressing Goal: RH STG INCREASE KNOWLEDGE OF STROKE PROPHYLAXIS Description: Patient's family will be able to manage secondary stroke risks with medications and dietary modifications using handouts and educational materials independently Outcome: Not Progressing

## 2021-07-04 LAB — BASIC METABOLIC PANEL
Anion gap: 8 (ref 5–15)
BUN: 31 mg/dL — ABNORMAL HIGH (ref 8–23)
CO2: 28 mmol/L (ref 22–32)
Calcium: 9.6 mg/dL (ref 8.9–10.3)
Chloride: 100 mmol/L (ref 98–111)
Creatinine, Ser: 1.19 mg/dL (ref 0.61–1.24)
GFR, Estimated: >60 mL/min (ref >60)
Glucose, Bld: 102 mg/dL — ABNORMAL HIGH (ref 70–99)
Potassium: 4.3 mmol/L (ref 3.5–5.1)
Sodium: 136 mmol/L (ref 135–145)

## 2021-07-04 NOTE — Progress Notes (Signed)
Speech Language Pathology Daily Session Note  Patient Details  Name: Carlos Welch MRN: [id number] Date of Birth: 20-Jan-1946  Today's Date: 07/04/2021 SLP Individual Time: 5643-3295 SLP Individual Time Calculation (min): 30 min  Short Term Goals: Week 1: SLP Short Term Goal 1 (Week 1): Patient will participate in further cognitive and expressive/receptive language testing to determine functional needs. SLP Short Term Goal 2 (Week 1): Patient will orient x4 with min A verbal cues for use of external aids across 3 consecutive sessions SLP Short Term Goal 3 (Week 1): Patient will demonstrate sustained attention to functional tasks for 5-10 minute intervals with min A verbal redirection. SLP Short Term Goal 4 (Week 1): Patient will complete mildly complex problem solving with mod A verbal cues SLP Short Term Goal 5 (Week 1): Patient will tolerate PO trials of Dys 3 textures with effective mastication and oral clearance prior to diet advancement with min A verbal cues to implement swallow strategies  Skilled Therapeutic Interventions: Patient agreeable to skilled ST intervention with focus on cognitive goals. SLP facilitated session by providing min-to-mod A verbal and visual cues for use of external orientation aids to orient to situation, place, and time. SLP also facilitated session by providing mod A which proceeded to max A verbal cues for simple problem solving and sustained attention which was approximately 5 minutes in duration suspect attributed to internal distractibility to discomfort. He was unable to specify pain location in detail as he reported "it's all over" but appeared to improve with emotional support and repositioning. Patient was left in chair with alarm activated and immediate needs within reach at end of session. Continue per current plan of care.      Pain Pain Assessment Pain Scale: 0-10 Pain Score: 6  Pain Type: Chronic pain Pain Location: Generalized Pain Orientation:  Left Pain Descriptors / Indicators: Aching Pain Frequency: Intermittent Pain Intervention(s): Medication (See eMAR)  Therapy/Group: Individual Therapy  Sawyer Mentzer T Atul Delucia 07/04/2021, 11:13 AM

## 2021-07-04 NOTE — Progress Notes (Signed)
Physical Therapy Session Note  Patient Details  Name: Carlos Welch MRN: [id number] Date of Birth: 21-May-1946  Today's Date: 07/04/2021 PT Individual Time: 1440-1535 PT Individual Time Calculation (min): 55 min   Short Term Goals: Week 1:  PT Short Term Goal 1 (Week 1): Pt will perform all bed mobility with consistent CGA. PT Short Term Goal 2 (Week 1): Pt will perform dynamic sitting balance tasks with Min A. PT Short Term Goal 3 (Week 1): Pt will perform sit <> stand with Mod A. PT Short Term Goal 4 (Week 1): Pt will initiate gait training with LRAD. PT Short Term Goal 5 (Week 1): Pt will decrease complaint of lightheadedness/ dizziness with mobility by 50%.  Skilled Therapeutic Interventions/Progress Updates:  Patient supine in bed on entrance to room. Patient alert and agreeable to PT session.   Patient with no pain complaint throughout session.  Therapeutic Activity: Bed Mobility: Patient performed supine --> sit with supervision to pt's L side. Requires min A to reach fully squared position on EOB. VC/ tc required for using RUE with palm flat and bringing R hip around to bring R foot to floor surface. At end of session Transfers: Patient performed sit<>stand with Min A and stand pivot transfer bed to w/c using RW and Min A +2 increasing to Mod A with increased lean to R side d/t fatigue. Provided vc/tc for sequencing of steps, fwd/ bkwd stepping, and walker mgmt. Sit <> stands to hallway handrail with CGA/ Min A. Sit <> stands to therapist and tech Bil hand support with Min/ Mod A +2 and vc/ tc for forward lean and increased effort to push through RLE.   Gait Training:  Patient ambulated 29' x1 using L hallway HR with Mod A for RLE foot placement, weight shift to L for improved R foot clearance, and increased R knee flexion during swing through followed by appropriate knee extension for stance phase. Demonstrated improved foot advancement with therapist provided weight shift. Provided  vc/ tc throughout to correct above impairments. Pt also ambulated 16' x1 using Mod A +2 with 3-musketeer support. Continued to provide appropriate weight shift and adjustment for wider foot placement for improved balance with pt stating that he is stepping out too wide. Pt with decreased quality of gait without use of handrail.   Neuromuscular Re-ed: NMR facilitated during session with focus on standing balance and proprioception. Pt guided in standing at RW and to therapist support at hips with +2 for HHA. Guided in finding midline and improved R knee extension in standing. Guard to R knee to prevent buckling. No buckle when provided with vc with increasing knee flexion with fatigue. Requires CGA initially for balance and decreases to Mod A with fatigue and heavy lean to R side. NMR performed for improvements in motor control and coordination, balance, sequencing, judgement, and self confidence/ efficacy in performing all aspects of mobility at highest level of independence.   Patient supine  in bed at end of session with brakes locked, bed alarm set, and all needs within reach. Tray table to pt's L side with phone and call button within reach.      Therapy Documentation Precautions:  Precautions Precautions: Fall Precaution Comments: R hemi, R inattention, decr R sensation, small subluxation of R shoulder, fluctuating agitation, hypertension, lightheadedness/ blurry vision/ nausea with change in position Restrictions Weight Bearing Restrictions: No General: PT Amount of Missed Time (min): 15 Minutes PT Missed Treatment Reason: Patient fatigue;Patient unwilling to participate Vital Signs: Therapy Vitals  Temp: (!) 97.5 F (36.4 C) Temp Source: Oral Pulse Rate: (!) 58 Resp: 17 BP: 122/87 Patient Position (if appropriate): Lying Oxygen Therapy SpO2: 98 % O2 Device: Room Air Pain:  No pain complaint during therapy session.   Therapy/Group: Individual Therapy  Loel Dubonnet PT,  DPT 07/04/2021, 3:43 PM

## 2021-07-04 NOTE — Progress Notes (Signed)
PROGRESS NOTE   Subjective/Complaints: Per CNA, ate much better today , 100% breakfast no pains, still cannot feel on RIght side   ROS: Denies CP, SOB, N/V/D  Objective:   No results found. No results for input(s): WBC, HGB, HCT, PLT in the last 72 hours.  No results for input(s): NA, K, CL, CO2, GLUCOSE, BUN, CREATININE, CALCIUM in the last 72 hours.   Intake/Output Summary (Last 24 hours) at 07/04/2021 0813 Last data filed at 07/04/2021 0757 Gross per 24 hour  Intake 710 ml  Output 900 ml  Net -190 ml          Physical Exam: Vital Signs Blood pressure 128/85, pulse (!) 53, temperature 98.3 F (36.8 C), temperature source Oral, resp. rate 17, height 6\' 2"  (1.88 m), weight 75.2 kg, SpO2 98 %.  General: No acute distress Mood and affect are appropriate Heart: Regular rate and rhythm no rubs murmurs or extra sounds Lungs: Clear to auscultation, breathing unlabored, no rales or wheezes Abdomen: Positive bowel sounds, soft nontender to palpation, nondistended Extremities: No clubbing, cyanosis, or edema Skin: No evidence of breakdown, no evidence of rash   Motor: 5/5 in left deltoid, bicep, tricep, grip, hip flexor, knee extensors, ankle dorsiflexor and plantar flexor Right upper extremity: Shoulder abduction, elbow flexion/extension 3-/5, handgrip 3/5 RLE 3- hip knee ext synergy , 3- Right ankle PF Sensation diminished to light touch right side  Assessment/Plan: 1. Functional deficits which require 3+ hours per day of interdisciplinary therapy in a comprehensive inpatient rehab setting. Physiatrist is providing close team supervision and 24 hour management of active medical problems listed below. Physiatrist and rehab team continue to assess barriers to discharge/monitor patient progress toward functional and medical goals  Care Tool:  Bathing    Body parts bathed by patient: Right arm, Chest, Abdomen, Right  upper leg, Left upper leg   Body parts bathed by helper: Left arm, Front perineal area, Buttocks, Right lower leg, Left lower leg     Bathing assist Assist Level: Total Assistance - Patient < 25%     Upper Body Dressing/Undressing Upper body dressing   What is the patient wearing?: Pull over shirt    Upper body assist Assist Level: Maximal Assistance - Patient 25 - 49%    Lower Body Dressing/Undressing Lower body dressing      What is the patient wearing?: Incontinence brief, Pants     Lower body assist Assist for lower body dressing: 2 Helpers     Toileting Toileting    Toileting assist Assist for toileting: 2 Helpers     Transfers Chair/bed transfer  Transfers assist  Chair/bed transfer activity did not occur: Safety/medical concerns        Locomotion Ambulation   Ambulation assist   Ambulation activity did not occur: Safety/medical concerns          Walk 10 feet activity   Assist  Walk 10 feet activity did not occur: Safety/medical concerns        Walk 50 feet activity   Assist Walk 50 feet with 2 turns activity did not occur: Safety/medical concerns         Walk 150 feet activity  Assist Walk 150 feet activity did not occur: Safety/medical concerns         Walk 10 feet on uneven surface  activity   Assist Walk 10 feet on uneven surfaces activity did not occur: Safety/medical concerns         Wheelchair     Assist Is the patient using a wheelchair?: Yes (Per PT long term goals) Type of Wheelchair: Manual Wheelchair activity did not occur: Safety/medical concerns         Wheelchair 50 feet with 2 turns activity    Assist    Wheelchair 50 feet with 2 turns activity did not occur: Safety/medical concerns       Wheelchair 150 feet activity     Assist  Wheelchair 150 feet activity did not occur: Safety/medical concerns       Blood pressure 128/85, pulse (!) 53, temperature 98.3 F (36.8 C),  temperature source Oral, resp. rate 17, height 6\' 2"  (1.88 m), weight 75.2 kg, SpO2 98 %.  Medical Problem List and Plan: 1.  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  Antithrombotics: -DVT/anticoagulation:  Mechanical: Sequential compression devices, below knee Bilateral lower extremities             -antiplatelet therapy: N/A 3. Pain: continue Neurontin 100 mg nightly, hydrocodone as needed  Controlled on 9/19 4. Mood: Provide emotional support             -antipsychotic agents: N/A 5. Neuropsych: This patient is capable of making decisions on his own behalf. 6. Skin/Wound Care: Routine skin checks- added lac hydrin  7. Fluids/Electrolytes/Nutrition: Routine in and outs Continue megestrol  8.  Post stroke dysphagia.  Dysphagia #2 nectar thick liquids.  Follow-up speech therapy, poor intake due to lethargy now on appetite stimulant and neurostimulant   Advance diet as tolerated- responding well intake improved  9.  Hypertension.  Hydralazine 50 mg every 8 hours, Cozaar 100 mg daily.  Monitor with increased mobility Vitals:   07/03/21 1918 07/04/21 0512  BP: (!) 123/91 128/85  Pulse: 61 (!) 53  Resp: 17 17  Temp: 98.6 F (37 C) 98.3 F (36.8 C)  SpO2: 100% 98%  Lasix, hydralazine, Cozaar decreased  controlled on 9/19 10.  Combined congestive heart failure.  Lasix decreased from 40mg  to 20mg    Labs ordered for tomorrow 11.  Chronic bilateral lower extremity edema/cellulitis.  Continue Lasix.  Complete course of doxycycline 12.  Atrial fibrillation.  Eliquis held due to ICH.  Cardiac rate controlled 13.  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  Follow-up cardiology services 14.  Hyperlipidemia.  Currently holding statin due to ICH 15.  Chronic thrombocytopenia.    Platelets 142 on 9/16 16.  Lethargy: Multifactorial  Repeat CT showed no new bleed  or infarct, CBC normal and afebrile to doubt infection, Thalamic strokes often cause problems with arousal Methylphenidate initiated  LOS: 7 days A FACE TO FACE EVALUATION WAS PERFORMED  07/04/2021, 8:13 AM

## 2021-07-04 NOTE — Progress Notes (Signed)
Occupational Therapy Session Note  Patient Details  Name: Carlos Welch MRN: [id number] Date of Birth: 11/21/1945  Today's Date: 07/04/2021 OT Individual Time: 0900-1000 OT Individual Time Calculation (min): 60 min    Short Term Goals: Week 1:  OT Short Term Goal 1 (Week 1): Pt will don shirt with mod A. OT Short Term Goal 2 (Week 1): Pt will don pants with max A.+ LRAD. OT Short Term Goal 3 (Week 1): Pt will tolerate completing ADL routine 2/3 sessions with no more than min VCs to redirect agitation. OT Short Term Goal 4 (Week 1): Pt will maintain static sitting with CGA and no posterior LOB for >5 min in prep for seated ADL.  Skilled Therapeutic Interventions/Progress Updates:    Pt seen this session with +2 A from rehab tech.  Pt in bed and when asked how he was he responded "ok I guess".  In efforts to get to know pt and develop rapport, pt would frequently respond "I dont know" to questions such as where do you live or do you have pets.   Informed pt we were going to help him get up and get dressed, and pt did not object.  Pt sat to EOB with mod A and then max squat pivot to his R to wc.    Pt positioned at sink for oral care.  Pt had food in his teeth/mouth. Asked pt to take his dentures out and he kept responding, "I dont know how" guided pt and he got them out but then needed total assist with cleansing dentures. In wc at sink, had pt wash face, chest and R arm with cues.  Donned new tshirt with max A.   Started to work on donning new pants and upon standing, pt yelled out "I am shitting".  Had pt sit back in wc and transferred him to [facility] over toilet with max of 2 stand pivot.  1 small pebble of BM came out.  In standing to get pants over hips, pt with heavy R lateral lean needing total A.  Pt transferred back to wc with max A of 2.   Pt has some bouts of agitation with yelling "stop talking" or "leave me alone" but will quiet pauses pt able to continue therapy without progressing  agitation.   Pt positioned in wc with belt alarm on and all needs met.    Therapy Documentation Precautions:  Precautions Precautions: Fall Precaution Comments: R hemi, R inattention, decr R sensation, small subluxation of R shoulder, fluctuating agitation, hypertension, lightheadedness/ blurry vision/ nausea with change in position Restrictions Weight Bearing Restrictions: No    Vital Signs: Therapy Vitals Temp: 98.3 F (36.8 C) Temp Source: Oral Pulse Rate: (!) 53 Resp: 17 BP: 128/85 Patient Position (if appropriate): Lying Oxygen Therapy SpO2: 98 % O2 Device: Room Air Pain:  No c/o pain initially, but halfway through said he had pain but could not state where   Therapy/Group: Individual Therapy  [place] 07/04/2021, 8:31 AM

## 2021-07-04 NOTE — Plan of Care (Signed)
  Problem: RH Swallowing Goal: LTG Patient will consume least restrictive diet using compensatory strategies with assistance (SLP) Description: LTG:  Patient will consume least restrictive diet using compensatory strategies with assistance (SLP) Flowsheets (Taken 07/04/2021 1715) LTG: Pt Patient will consume least restrictive diet using compensatory strategies with assistance of (SLP): Minimal Assistance - Patient > 75% Note: Goal modified due to slow progress Goal: LTG Patient will participate in dysphagia therapy to increase swallow function with assistance (SLP) Description: LTG:  Patient will participate in dysphagia therapy to increase swallow function with assistance (SLP) Flowsheets (Taken 07/04/2021 1715) LTG: Pt will participate in dysphagia therapy to increase swallow function with assistance of (SLP): Moderate Assistance - Patient 50 - 74% Note: Goal modified due to slow progress   Problem: RH Cognition - SLP Goal: RH LTG Patient will demonstrate orientation with cues Description:  LTG:  Patient will demonstrate orientation to person/place/time/situation with cues (SLP)   Flowsheets (Taken 07/04/2021 1715) LTG Patient will demonstrate orientation to:  Person  Place  Time  Situation LTG: Patient will demonstrate orientation using cueing (SLP): Moderate Assistance - Patient 50 - 74% Note: Goal modified due to slow progress   Problem: RH Comprehension Communication Goal: LTG Patient will comprehend basic/complex auditory (SLP) Description: LTG: Patient will comprehend basic/complex auditory information with cues (SLP). Flowsheets (Taken 07/04/2021 1715) LTG: Patient will comprehend auditory information with cueing (SLP):  Minimal Assistance - Patient > 75%  Supervision Note: Goal modified due to slow progress   Problem: RH Expression Communication Goal: LTG Patient will verbally express basic/complex needs(SLP) Description: LTG:  Patient will verbally express basic/complex  needs, wants or ideas with cues  (SLP) Flowsheets (Taken 07/04/2021 1715) LTG: Patient will verbally express basic/complex needs, wants or ideas (SLP):  Supervision  Minimal Assistance - Patient > 75% Note: Goal modified due to slow progress   Problem: RH Problem Solving Goal: LTG Patient will demonstrate problem solving for (SLP) Description: LTG:  Patient will demonstrate problem solving for basic/complex daily situations with cues  (SLP) Flowsheets (Taken 07/04/2021 1715) LTG Patient will demonstrate problem solving for: Moderate Assistance - Patient 50 - 74% Note: Goal modified due to slow progress   Problem: RH Attention Goal: LTG Patient will demonstrate this level of attention during functional activites (SLP) Description: LTG:  Patient will will demonstrate this level of attention during functional activites (SLP) Flowsheets (Taken 07/04/2021 1715) LTG: Patient will demonstrate this level of attention during cognitive/linguistic activities with assistance of (SLP): Moderate Assistance - Patient 50 - 74% Note: Goal modified due to slow progress

## 2021-07-04 NOTE — Progress Notes (Signed)
Speech Language Pathology Weekly Progress and Session Note  Patient Details  Name: Carlos Welch MRN: [id number] Date of Birth: [birth year]  Beginning of progress report period: June 28, 2021 End of progress report period: July 05, 2021  Today's Date: 07/05/2021 SLP Individual Time: 0962-8366 SLP Individual Time Calculation (min): 35 min and Today's Date: 07/05/2021 SLP Missed Time: 10 Minutes Missed Time Reason: Patient unwilling to participate  Short Term Goals: Week 1: SLP Short Term Goal 1 (Week 1): Patient will participate in further cognitive and expressive/receptive language testing to determine functional needs. SLP Short Term Goal 1 - Progress (Week 1): Met SLP Short Term Goal 2 (Week 1): Patient will orient x4 with min A verbal cues for use of external aids across 3 consecutive sessions SLP Short Term Goal 2 - Progress (Week 1): Progressing toward goal SLP Short Term Goal 3 (Week 1): Patient will demonstrate sustained attention to functional tasks for 5-10 minute intervals with min A verbal redirection. SLP Short Term Goal 3 - Progress (Week 1): Progressing toward goal SLP Short Term Goal 4 (Week 1): Patient will complete mildly complex problem solving with mod A verbal cues SLP Short Term Goal 4 - Progress (Week 1): Revised due to lack of progress SLP Short Term Goal 5 (Week 1): Patient will tolerate PO trials of Dys 3 textures with effective mastication and oral clearance prior to diet advancement with min A verbal cues to implement swallow strategies SLP Short Term Goal 5 - Progress (Week 1): Revised due to lack of progress  New Short Term Goals: Week 2: SLP Short Term Goal 1 (Week 2): Patient will orient x4 with mod A verbal cues for use of external aids SLP Short Term Goal 2 (Week 2): Patient will demonstrate sustained attention to functional tasks for 5 minute duration with mod A verbal and visual cues SLP Short Term Goal 3 (Week 2): Patient will complete  functional sequencing tasks with max A verbal and visual cues SLP Short Term Goal 4 (Week 2): Patient will follow 1 step directions with mod A verbal cues SLP Short Term Goal 5 (Week 2): Patient will consume current diet with improved self-monitoring of pocketing and anterior labial spillage with mod A verbal and visual cues to implement compensatory strategies  Weekly Progress Updates: Patient has made slow progress with ST over the past week and has met 1 of 5 short term goals this reporting period. Reasons for goals not being met likely due to inconsistent participation, receptiveness to therapy resulting in verbal agitation at times, and lethargy. ST and LT goals were modified to more accurately reflect projected outcomes. Per spouse, pt likely near baseline however spouse has not been present to confirm. Patient is currently completing cognitive-linguistic tasks with mod-to-max A verbal and visual cues for orientation, sustained attention for >5 minute duration, and functional problem solving. Requiring sup-to-min A for functional comprehension and expression which appears influenced by mood or receptiveness to participate. Patient continues to exhibit significant oropharyngeal dysphagia and is consuming a Dys 2 diet and nectar thick liquids. He is consuming meds whole in puree with what appears to be timely AP transit and no overt s/sx of aspiration or pharyngeal complaints. He has participated in Dys 3 PO trials however continues to exhibit significantly impaired mastication, oral coordination and control, and oral clearance resulting in pocketing and diffuse oral residue. He continues to consume nectar thick liquids without overt s/sx of aspiration and anterior labial spillage. Awareness of spillage improved with consistent verbal  feedback. He continues to require full supervision to implement safe swallow strategies and monitor rate of consumption and bolus volumes. Patient does not appear appropriate  for repeat instrumental swallow assessment at this time secondary to substantially limited focused and sustained attention. Will continue to monitor for appropriateness as this appears to fluctuate daily. Patient/family education is ongoing. Recommend continued skilled ST intervention to maximize swallow and cognitive function and functional independence prior to discharge.    Intensity: Minumum of 1-2 x/day, 30 to 90 minutes Frequency: 3 to 5 out of 7 days Duration/Length of Stay: 3-3.5 weeks Treatment/Interventions: Cognitive remediation/compensation;Environmental controls;Internal/external aids;Other (comment);Functional tasks;Dysphagia/aspiration precaution training;Cueing hierarchy;Patient/family education  Daily Session Skilled Therapeutic Interventions: Patient agreeable to skilled ST intervention with focus on cognitive and swallowing goals. Pt requested for a drink on arrival. Provided pt with nectar thick liquids by cup and provided min A verbal cues to monitor anterior labial spillage. Pt exhibited improved self monitoring skills and exhibited anterior loss of bolus x1 with 8oz of liquids. Pt exhibited no overt s/sx of aspiration. SLP facilitated session by providing min A verbal cues for orientation. Without cues, he was oriented to person, day of week, month, year, and situation. Required max A verbal cues and encouragement to locate orientation visual to identify place (town and name of hospital). Max A likely result of increasing verbal agitation and resistance to continue treatment. Pt reported "I don't want to do nothing." SLP provided pt with brief 5 minute break prior to transition to next task however patient not agreeable and adamant he would not participate further. Pt missed 10 minutes of scheduled treatment due to refusal to participate. Patient was left in chair with alarm activated and immediate needs within reach at end of session. Continue per current plan of care.      General     Pain Pain Assessment Pain Scale: 0-10 Pain Score: 0-No pain  Therapy/Group: Individual Therapy  Nayellie Sanseverino T Jimel Myler 07/05/2021, 11:02 AM

## 2021-07-04 NOTE — Progress Notes (Signed)
Physical Therapy Session Note  Patient Details  Name: Carlos Welch MRN: [id number] Date of Birth: Jun 28, 1946  Today's Date: 07/04/2021 PT Individual Time: 8242-3536 PT Individual Time Calculation (min): 30 min  and Today's Date: 07/04/2021 PT Missed Time: 15 Minutes Missed Time Reason: Patient fatigue;Patient unwilling to participate  Short Term Goals: Week 1:  PT Short Term Goal 1 (Week 1): Pt will perform all bed mobility with consistent CGA. PT Short Term Goal 2 (Week 1): Pt will perform dynamic sitting balance tasks with Min A. PT Short Term Goal 3 (Week 1): Pt will perform sit <> stand with Mod A. PT Short Term Goal 4 (Week 1): Pt will initiate gait training with LRAD. PT Short Term Goal 5 (Week 1): Pt will decrease complaint of lightheadedness/ dizziness with mobility by 50%.  Skilled Therapeutic Interventions/Progress Updates:    Patient received sitting up in wc, agreeable to PT. He denies pain. Perseverative on R UE "just moving on its own." PT transported patient in wc to therapy gym for time management and energy conservation. He was able to complete sit <> stand in // bars with MinA and R knee blocked. Once standing, patient with slight R lateral lean and difficulty returning to midline. Patient reporting bowel incontinence in standing requesting to return to room. Stedy used to transfer to bed. Noted pushing to R and strong R lateral lean in [place]. ModA to return supine. Grossly MaxA for clothing management and ModA for rolling. Patient found to not have been incontinent of bowel. Once pants donned, patient then refusing to get back out of bed, reporting fatigue. PT unable to redirect patient to therapeutic task. Remaining in bed, bed alarm on, call light within reach.   Therapy Documentation Precautions:  Precautions Precautions: Fall Precaution Comments: R hemi, R inattention, decr R sensation, small subluxation of R shoulder, fluctuating agitation, hypertension,  lightheadedness/ blurry vision/ nausea with change in position Restrictions Weight Bearing Restrictions: No     Therapy/Group: Individual Therapy  Elizebeth Koller, PT, DPT, CBIS  07/04/2021, 7:36 AM

## 2021-07-05 NOTE — Progress Notes (Addendum)
Physical Therapy Weekly Progress Note  Patient Details  Name: Carlos Welch MRN: [id number] Date of Birth: 08-26-1946  Beginning of progress report period: June 28, 2021 End of progress report period: July 05, 2021  Today's Date: 07/05/2021 PT Individual Time: 5170-0174 PT Individual Time Calculation (min): 43 min   Patient has met 4 of 5 short term goals.  Pt making slow progress towards goals and is on track to meet LTG. He has missed time in  therapy due to refusal related to fatigue and constipation. He completes bed mobility with CGA/ Min A, sit<>stand transfers with CGA/ Min A, and stand<>pivot transfers with Mod A. He has ambulated up to 8ft with Mod A and use of L hallway HR for UE support. Improved participation and reduced c/o lightheadedness and nausea with control of BP. He continues to be primarily limited by premorbid deconditioning, decreased calorie intake,  decreased dynamic standing balance, RUE sensory/ motor deficits, RUE and RLE weakness with hypertonia, and gait impairments.    Patient continues to demonstrate the following deficits muscle weakness, decreased cardiorespiratoy endurance, impaired timing and sequencing, abnormal tone, unbalanced muscle activation, and decreased coordination, decreased visual acuity and field cut, decreased midline orientation, decreased attention to right, and decreased motor planning, decreased initiation, decreased attention, decreased awareness, decreased problem solving, decreased safety awareness, and delayed processing, and decreased sitting balance, decreased standing balance, decreased postural control, decreased balance strategies, and hemipareisis  and therefore will continue to benefit from skilled PT intervention to increase functional independence with mobility.  Patient progressing toward long term goals..  Continue plan of care.  PT Short Term Goals Week 1:  PT Short Term Goal 1 (Week 1): Pt will perform all bed  mobility with consistent CGA. PT Short Term Goal 2 (Week 1): Pt will perform dynamic sitting balance tasks with Min A. PT Short Term Goal 3 (Week 1): Pt will perform sit <> stand with Mod A. PT Short Term Goal 4 (Week 1): Pt will initiate gait training with LRAD. PT Short Term Goal 5 (Week 1): Pt will decrease complaint of lightheadedness/ dizziness with mobility by 50%. Week 2:  PT Short Term Goal 1 (Week 2): Pt will perform all bed mobility with consistent CGA. PT Short Term Goal 2 (Week 2): Pt will perform sit <> stand with consistent Min A PT Short Term Goal 3 (Week 2): Pt will perform funcitonal seat to seat transfers with Min A. PT Short Term Goal 4 (Week 2): Pt will ambulate at least 50 feet with consistent Mod A. PT Short Term Goal 5 (Week 2): Pt will perform dynamic sitting balance tasks with CGA.  Skilled Therapeutic Interventions/Progress Updates:  Patient seated in reclined back position in w/c on entrance to room. Patient alert and demanding to return to bed. Agreeable to assist for return to bed. With recommendation for toileting prior to return to bed in order to allow time for nursing to add fresh bed linens, pt exclaims, " just put me back in bed". 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 Goal in rehab is not for staff to "put him back in bed" but pt to participate in putting self back to bed as family will not be able to provide total physical assist for him on return home. Pt encouraged again to attempt sitting on toilet to move bowels as he has not done so in  a few days. Educated on stimulation of bowels with sensation of toilet seat on posterior thighs. Pt refusing to enter bathroom to toilet.   Patient with no pain complaint throughout session.  Therapeutic Activity:  Transfers: Patient positioned at bedside in w/c for stand pivot transfer back to bed. Provided  verbal cues prior to transfer for increased pt participation. On PT self positioning at pt, he exclaims again, "just pick me up and put me back in bed".  Pt following commands provided during transfer. Sit to stand performed with CGA/ Min A. Pivot stepping elicits extensor tone in RLE and UB flexion requiring Mod A to complete stepping prior to descent to sit. VC/ tc for technique throughout.   Bed Mobility: Patient performed supine --> sit with CGA/ Min A for BLE. Pt able to floow vc for improved positioning. Performs bridging x4 for removal of gait belt and to smooth linens beneath pt. Pt also able to assist with extensor push from BLE and Min A from therapist with bed controls for improved positioning toward [place].  VC/ tc required for technique and positioning throughout.  Patient supine  in bed at end of session with brakes locked, bed alarm set, and all needs within reach.     Therapy Documentation Precautions:  Precautions Precautions: Fall Precaution Comments: R hemi, R inattention, decr R sensation, small subluxation of R shoulder, fluctuating agitation, hypertension, lightheadedness/ blurry vision/ nausea with change in position Restrictions Weight Bearing Restrictions: No General:   Vital Signs:  Pain:  Pt with no overt c/o pain. Main complaint is to return to bed d/t discomfort in w/c.   Therapy/Group: Individual Therapy  Alger Simons PT, DPT 07/05/2021, 5:37 PM

## 2021-07-05 NOTE — Progress Notes (Signed)
PROGRESS NOTE   Subjective/Complaints:  No abd discomfort, pt doing well with appetite , no abd pain, per OT participating better   ROS: Denies CP, SOB, N/V/D  Objective:   No results found. No results for input(s): WBC, HGB, HCT, PLT in the last 72 hours.  Recent Labs    07/04/21 0757  NA 136  K 4.3  CL 100  CO2 28  GLUCOSE 102*  BUN 31*  CREATININE 1.19  CALCIUM 9.6     Intake/Output Summary (Last 24 hours) at 07/05/2021 9767 Last data filed at 07/05/2021 0300 Gross per 24 hour  Intake 320 ml  Output 1200 ml  Net -880 ml          Physical Exam: Vital Signs Blood pressure 128/87, pulse (!) 57, temperature 98.5 F (36.9 C), temperature source Oral, resp. rate 16, height 6\' 2"  (1.88 m), weight 77.2 kg, SpO2 100 %.  General: No acute distress Mood and affect are appropriate Heart: Regular rate and rhythm no rubs murmurs or extra sounds Lungs: Clear to auscultation, breathing unlabored, no rales or wheezes Abdomen: Positive bowel sounds, soft nontender to palpation, nondistended Extremities: No clubbing, cyanosis, or edema Skin: No evidence of breakdown, no evidence of rash   Motor: 5/5 in left deltoid, bicep, tricep, grip, hip flexor, knee extensors, ankle dorsiflexor and plantar flexor Right upper extremity: Shoulder abduction, elbow flexion/extension 3-/5, handgrip 3/5 RLE 3- hip knee ext synergy , 3- Right ankle PF Sensation diminished to light touch right side  Assessment/Plan: 1. Functional deficits which require 3+ hours per day of interdisciplinary therapy in a comprehensive inpatient rehab setting. Physiatrist is providing close team supervision and 24 hour management of active medical problems listed below. Physiatrist and rehab team continue to assess barriers to discharge/monitor patient progress toward functional and medical goals  Care Tool:  Bathing    Body parts bathed by patient:  Abdomen, Chest, Right arm (UB only today)   Body parts bathed by helper: Left arm     Bathing assist Assist Level: Total Assistance - Patient < 25%     Upper Body Dressing/Undressing Upper body dressing   What is the patient wearing?: Pull over shirt    Upper body assist Assist Level: Maximal Assistance - Patient 25 - 49%    Lower Body Dressing/Undressing Lower body dressing      What is the patient wearing?: Incontinence brief, Pants     Lower body assist Assist for lower body dressing: 2 Helpers     Toileting Toileting    Toileting assist Assist for toileting: 2 Helpers     Transfers Chair/bed transfer  Transfers assist  Chair/bed transfer activity did not occur: Safety/medical concerns  Chair/bed transfer assist level: 2 Helpers     Locomotion Ambulation   Ambulation assist   Ambulation activity did not occur: Safety/medical concerns          Walk 10 feet activity   Assist  Walk 10 feet activity did not occur: Safety/medical concerns        Walk 50 feet activity   Assist Walk 50 feet with 2 turns activity did not occur: Safety/medical concerns  Walk 150 feet activity   Assist Walk 150 feet activity did not occur: Safety/medical concerns         Walk 10 feet on uneven surface  activity   Assist Walk 10 feet on uneven surfaces activity did not occur: Safety/medical concerns         Wheelchair     Assist Is the patient using a wheelchair?: Yes (Per PT long term goals) Type of Wheelchair: Manual Wheelchair activity did not occur: Safety/medical concerns         Wheelchair 50 feet with 2 turns activity    Assist    Wheelchair 50 feet with 2 turns activity did not occur: Safety/medical concerns       Wheelchair 150 feet activity     Assist  Wheelchair 150 feet activity did not occur: Safety/medical concerns       Blood pressure 128/87, pulse (!) 57, temperature 98.5 F (36.9 C), temperature  source Oral, resp. rate 16, height 6\' 2"  (1.88 m), weight 77.2 kg, SpO2 100 %.  Medical Problem List and Plan: 1.  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  Antithrombotics: -DVT/anticoagulation:  Mechanical: Sequential compression devices, below knee Bilateral lower extremities             -antiplatelet therapy: N/A 3. Pain: continue Neurontin 100 mg nightly, hydrocodone as needed  Controlled on 9/19 4. Mood: Provide emotional support             -antipsychotic agents: N/A 5. Neuropsych: This patient is capable of making decisions on his own behalf. 6. Skin/Wound Care: Routine skin checks- added lac hydrin  7. Fluids/Electrolytes/Nutrition: Routine in and outs Continue megestrol  8.  Post stroke dysphagia.  Dysphagia #2 nectar thick liquids.  Follow-up speech therapy, poor intake due to lethargy now on appetite stimulant and neurostimulant   Advance diet as tolerated- responding well intake improved  9.  Hypertension.  Hydralazine 50 mg every 8 hours, Cozaar 100 mg daily.  Monitor with increased mobility Vitals:   07/04/21 2000 07/05/21 0523  BP: 124/88 128/87  Pulse: 67 (!) 57  Resp: 14 16  Temp: 97.8 F (36.6 C) 98.5 F (36.9 C)  SpO2: 97% 100%  Lasix, hydralazine, Cozaar decreased  controlled on 9/19 10.  Combined congestive heart failure.  Lasix decreased from 40mg  to 20mg    BUN still elevated but improving , no signs of decompensation  11.  Chronic bilateral lower extremity edema/cellulitis.  Continue Lasix.  Complete course of doxycycline 12.  Atrial fibrillation.  Eliquis held due to ICH.  Cardiac rate controlled 13.  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  Follow-up cardiology services 14.  Hyperlipidemia.  Currently holding statin due to ICH 15.  Chronic thrombocytopenia.    Platelets 142 on 9/16 16.   Lethargy: Multifactorial  Repeat CT showed no new bleed or infarct, CBC normal and afebrile to doubt infection, Thalamic strokes often cause problems with arousal Methylphenidate initiated  LOS: 8 days A FACE TO FACE EVALUATION WAS PERFORMED  07/05/2021, 7:22 AM

## 2021-07-05 NOTE — Progress Notes (Signed)
Occupational Therapy Session Note  Patient Details  Name: Carlos Welch MRN: [id number] Date of Birth: 04-23-1946  Today's Date: 07/05/2021 OT Individual Time: 0388-8280 OT Individual Time Calculation (min): 54 min + 44 min   Short Term Goals: Week 1:  OT Short Term Goal 1 (Week 1): Pt will don shirt with mod A. OT Short Term Goal 2 (Week 1): Pt will don pants with max A.+ LRAD. OT Short Term Goal 3 (Week 1): Pt will tolerate completing ADL routine 2/3 sessions with no more than min VCs to redirect agitation. OT Short Term Goal 4 (Week 1): Pt will maintain static sitting with CGA and no posterior LOB for >5 min in prep for seated ADL.  Skilled Therapeutic Interventions/Progress Updates:    Session 1 [phone]): Pt received asleep in bed, easily awakened to voice, agreeable to get up if able to eat breakfast. Session focus on self-care retraining, OOB activity tolerance, func transfers in prep for improved ADL/IADL/func mobility performance + decreased caregiver burden. Came to sitting EOB with mod A to scoot R hip forward, pt requested to get out on L side. Maintained static sitting balance with S. Attempted squat-pivot > reclining back w/c on his R, unable to follow commands to safely complete without + 2 assist. Became agitated and required break to calm down. Ate breakfast sitting EOB to challenge activity tolerance while seated unsupported. Demos improvement in self-feeding by alternating sips/solids when prompted. However, limited follow through for taking small bites/suctioning per SLP swallowing sheet. Req max A for thorough suctioning due to R pocketing. Max A + 2 for squat-pivot to reclining back w/c. Pt with limited assistance. Finished breakfasted while seated in w/c . MD in/out morning assessment. Total A with suction sponge to complete oral care due to significant R pocketing. Noted to keep eyes closed and fatigued by end of session.   Pt left in reclining back w/c with safety belt  alarm engaged, call bell in reach, and all immediate needs met.     Session 2 [phone]) : Pt received semi-reclined in bed, no c/o pain, agreeable to therapy. Session focus on self-care retraining, activity tolerance, LUE NMR, func transfers in prep for improved ADL/IADL/func mobility performance + decreased caregiver burden.  Came to sitting EOB on his L with increased time and min A to scoot hips forward. Squat-pivot to and from reclining back w/c with max A + 2. In gym, completed 2x10 forward towel slides, scapular elevation, biceps curls with RUE. Issued least resistive tan theraputty and pt able to complete 10 putty squeezes with R hand, noted to drop frequently with poor awareness. Required hand-over-hand assist to roll putty out into log shape. Total A + 2 to boost up in bed.Self-drank from cup with no spillage and close S, required increased time to get straw to mouth. Fewer instances of agitation this session.  Pt left with HOB over 30 degrees with bed alarm engaged, call bell in reach, and all immediate needs met. 4 bed rails up for safety.    Therapy Documentation Precautions:  Precautions Precautions: Fall Precaution Comments: R hemi, R inattention, decr R sensation, small subluxation of R shoulder, fluctuating agitation, hypertension, lightheadedness/ blurry vision/ nausea with change in position Restrictions Weight Bearing Restrictions: No  Pain:   See session notes ADL: See Care Tool for more details.   Therapy/Group: Individual Therapy  [name] MS, OTR/L  07/05/2021, 6:37 AM

## 2021-07-06 MED ORDER — METHYLPHENIDATE HCL 5 MG PO TABS
10.0000 mg | ORAL_TABLET | Freq: Two times a day (BID) | ORAL | Status: DC
  Administered 2021-07-06 – 2021-07-13 (×14): 10 mg via ORAL
  Filled 2021-07-06 (×15): qty 2

## 2021-07-06 NOTE — Progress Notes (Signed)
Patient ID: Carlos Welch, male   DOB: 11/02/1945, 75 y.o.   MRN: [id number] Team Conference Report to Patient/Family  Team Conference discussion was reviewed with the patient and caregiver, including goals, any changes in plan of care and target discharge date.  Patient and caregiver express understanding and are in agreement.  The patient has a target discharge date of 07/20/21 (SNF pending).  SW called pt daughter Carlos Welch). Provided team conference updates.  Daughter aware of med adjustments, would like to know how long patient will be prescribed Ritalin. Informed daughter of SNF recommendation. Pt daughter reports it will be hard to care for patient since she travels for work, so currently SNF would be the best option. SW will provide pt daughter with a list of SNF facilities to [email or web address] with anticipation of discharge early next week.  Carlos Welch 07/06/2021, 1:44 PM

## 2021-07-06 NOTE — Progress Notes (Signed)
Physical Therapy Session Note  Patient Details  Name: Carlos Welch MRN: [id number] Date of Birth: 03/21/1946  Today's Date: 07/06/2021 PT Individual Time: 1030-1130 PT Individual Time Calculation (min): 60 min   Short Term Goals: Week 1:  PT Short Term Goal 1 (Week 1): Pt will perform all bed mobility with consistent CGA. PT Short Term Goal 2 (Week 1): Pt will perform dynamic sitting balance tasks with Min A. PT Short Term Goal 3 (Week 1): Pt will perform sit <> stand with Mod A. PT Short Term Goal 4 (Week 1): Pt will initiate gait training with LRAD. PT Short Term Goal 5 (Week 1): Pt will decrease complaint of lightheadedness/ dizziness with mobility by 50%. Week 2:  PT Short Term Goal 1 (Week 2): Pt will perform all bed mobility with consistent CGA. PT Short Term Goal 2 (Week 2): Pt will perform sit <> stand with consistent Min A PT Short Term Goal 3 (Week 2): Pt will perform funcitonal seat to seat transfers with Min A. PT Short Term Goal 4 (Week 2): Pt will ambulate at least 50 feet with Min A. PT Short Term Goal 5 (Week 2): Pt will perform dynamic sitting balance tasks with CGA.  Skilled Therapeutic Interventions/Progress Updates:  Patient supine in bed on entrance to room. Patient initially asleep  and requires time to fully wake and become alert. Then agreeable to PT session.   Patient with no pain complaint at start of session, then complains of pain that pt cannot localize or provide numerical rating for. Pt also demos regular tendency to use pain complaint and incontinence to self limit activity.   Therapeutic Activity: Bed Mobility: Patient performed supine --> sit to R side with Min A for push up to seated position on EOB. VC/ tc required for technique and sequencing. At end of session, pt requires Min A +2 to return to supine. Transfers: Patient performed squat pivot transfers bed <> w/c throughout session with Min/ Mod A+2. Provided verbal cues for foot/ hand positioning and  effort/ initiating.  Gait Training:  Patient ambulated 5' x1/ 2' x1 using L HR in hallway with Mod A. Demonstrated increased agitation with decreased ability to advance RLE with vc/ tc. Pt  self limits, starts to yell "I can't do it!", and initiates descent to sit. Pt agitated with inability to advance RLE and is verbally aggressive with statements of depression.  Pt requires calm discussion re: stroke and general progress expected as well as progress that pt has already made since start of care. Pointed out that pt's pain diminishes with mobility. Educates that pt self limits when others see his potential and attempt to push pt to reach greater potential. Able to eventually calm pt with pt agreeing to continue with therapy. With attempt at second bout, pt again becomes agitated with inability to advance RLE and begins to yell re: bowel incontinence. Lowered to sit Mod A, and continues to yell re: incontinent episode and disability. Continued education and calm discussion re: current level of abilities and potential for progression in order to continue to build therapeutic alliance and provide positive support.   Pt requires Max/ total A +2 for supine pericare and brief change.   Patient supine  in bed at end of session with brakes locked, bed alarm set, and all needs within reach.     Therapy Documentation Precautions:  Precautions Precautions: Fall Precaution Comments: R hemi, R inattention, decr R sensation, small subluxation of R shoulder, fluctuating agitation, hypertension,  lightheadedness/ blurry vision/ nausea with change in position Restrictions Weight Bearing Restrictions: No  Pain:  Relates pain in R foot, then RLE then all over without ability to fully pinpoint area or provide numerical value for pain. Addressed pain with movement and mobility with pt related decrease in pain.   Therapy/Group: Individual Therapy  Loel Dubonnet PT, DPT 07/06/2021, 1:54 PM

## 2021-07-06 NOTE — Progress Notes (Signed)
Physical Therapy Session Note  Patient Details  Name: NKOSI CORTRIGHT MRN: [id number] Date of Birth: [birth year]  Today's Date: 07/06/2021 PT Individual Time: 1572-6203 PT Individual Time Calculation (min): 54 min   Short Term Goals:  Week 2:  PT Short Term Goal 1 (Week 2): Pt will perform all bed mobility with consistent CGA. PT Short Term Goal 2 (Week 2): Pt will perform sit <> stand with consistent Min A PT Short Term Goal 3 (Week 2): Pt will perform funcitonal seat to seat transfers with Min A. PT Short Term Goal 4 (Week 2): Pt will ambulate at least 50 feet with Min A. PT Short Term Goal 5 (Week 2): Pt will perform dynamic sitting balance tasks with CGA.  Skilled Therapeutic Interventions/Progress Updates:   Pt received supine in bed and agreeable to PT. Supine>sit transfer with min assist and min cues for attention to the RLE and RUE.   Min assist squat pivot transfer to WC. Pt transported to orthogym.   Sit<>stand in standing frame while performing forward and lateral reach to the L mod - max assist to maintain midline and force WB through the RLE. Performed lateral reach x 8 with increasing assist with fatigue.   Pt reports need for BM. Stedy transfer to [facility] over toilet pt able to have BM once sitting in midline with max cues for attention to task due to increasing pushers response when initially attempting to BM. Pericame completed with +2 assist for balance and hygiene. Noted to have RLE sustained in flexion while performing pericare, requiring max assist from PT to prevent lateral LOB.  Pt returned to room and performed stedy transfer to bed with +2 for safety. Sit>supine completed with min assist on the RLE.  Supine NMR for SLR, hip abduction, heel slide and brdge x 8 BLE with cues for attention to task. Rolling R and L x 3 each and mod cues for attention to the RUE. Marland Kitchen   Pt left supine in bed with call bell in reach and all needs met.        Therapy  Documentation Precautions:  Precautions Precautions: Fall Precaution Comments: R hemi, R inattention, decr R sensation, small subluxation of R shoulder, fluctuating agitation, hypertension, lightheadedness/ blurry vision/ nausea with change in position Restrictions Weight Bearing Restrictions: No  Vital Signs: Therapy Vitals Temp: 98.4 F (36.9 C) Temp Source: Oral Pulse Rate: 64 Resp: 18 BP: 124/83 Patient Position (if appropriate): Lying Oxygen Therapy SpO2: 100 % O2 Device: Room Air Pain: Denies at rest.    Therapy/Group: Individual Therapy  Lorie Phenix 07/06/2021, 3:48 PM

## 2021-07-06 NOTE — Patient Care Conference (Signed)
Inpatient RehabilitationTeam Conference and Plan of Care Update Date: 07/06/2021   Time: 10:07 AM    Patient Name: Carlos Welch      Medical Record Number: [id number]  Date of Birth: 1945/11/23 Sex: Male         Room/Bed: 4M03C/4M03C-01 Payor Info: Payor: MEDICARE / Plan: MEDICARE PART A AND B / Product Type: *No Product type* /    Admit Date/Time:  06/27/2021  4:24 PM  Primary Diagnosis:  ICH (intracerebral hemorrhage) [facility])  Hospital Problems: Principal Problem:   ICH (intracerebral hemorrhage) (HCC) Active Problems:   Dysphagia, post-stroke   Chronic combined systolic and diastolic congestive heart failure [facility])    Expected Discharge Date: Expected Discharge Date: 07/20/21 (SNF pending)  Team Members Present: Physician leading conference: Dr. Claudette Laws Social Worker Present: Lavera Guise, BSW Nurse Present: Chana Bode, RN PT Present: Grier Rocher, PT OT Present: Annye English, OT SLP Present: Eilene Ghazi, SLP PPS Coordinator present : Fae Pippin, SLP     Current Status/Progress Goal Weekly Team Focus  Bowel/Bladder   Patient has an indwelling foley catheter. Patient is incontinent of bowel. LBM:07/02/21  Patient will regain continence of bowel.  Offer toileting q2h and prn.   Swallow/Nutrition/ Hydration   Dys 2, nectar thick. sup A verbal cues  sup A  Tolerance of Dys 2, nectar thick liquids, implementation of compensatory swallow strategies (lingual sweep to clear pocketing, clearance of anterior labial spillage).   ADL's   total A + 2 for toilet transfer via stedy/squat-pivot, LBD, max A UBD, oral care, mod UBB, min self-feeding; improved participation/decreased agitation, Brunstrum level II to III RUE, III R hand  min A toileting, bathroom transfers, dressing, bathing; S self-feeding, grooming  RUE NMR, balance/self-care/transfer retraining, func cognition, pt/family/DME/AE education, activity tolerance   Mobility   Bed mobility = CGA/ Min  A with max encouragement, seated balance maintained with CGA/ supervision; sit<>stand with CGA/ Min A stand/ squat pivot with Mod A; gait up to 38 ft with Mod A using hallway HR to LUE. hypertonia with activation of LLE  Bed mobility at supervision/ CGA, Transfers and gait at Min A; w/c mobility at CGA  improved BP, standing balance/ tolerance, sitting tolerance,imrpoving activity tolerance, R NMR, improving transfer training, gait training, w/c mobility training prn. family education   Communication   decreased thought organization, additional processing time for comprehension. min A  sup-to-min A  following commands, communicating functional needs   Safety/Cognition/ Behavioral Observations  mod-to-max  mod A  focused and sustained attention, sequencing, orientation   Pain   No complaints of pain at this time.  3<=  Assess patients pain level q shift and prn.   Skin   Skin is intact.  Skin will remain intact and free from s/s of infection.  Assess skin q shift and prn. Encourage patient to reposition q2h.     Discharge Planning:  Discharging home with dtr and grandson. Daughter travels often for work. Family able to provide 24/7   Team Discussion: More alert, MD adjusting Ritalin dosage.  Appetite better with megace. Continue foley for retention and incontinent of bowel. Cellulitis on lower extremities treated and healing; skin is discolored. Daughter reported non compliance PTA, behavioral issues premorbid. Patient on target to meet rehab goals: no, verbal outbursts decreased and increased participation with therapy sessions. Patient still requests to be put back to bed within a short period of time and functionally shown little progress over the past week of so. Discharge goals set for  min assist level.  *See Care Plan and progress notes for long and short-term goals.   Revisions to Treatment Plan:  Goals downgraded for gait. Working on maintaining focus on tasks at hand, sustained  attention and sequencing tasks, problem solving.  Teaching Needs: Medications, dietary modifications, secondary risk management, transfers, toileting, etc.  Current Barriers to Discharge: Decreased caregiver support, Home enviroment access/layout, Incontinence, and Behavior Daughter travels a lot for work; grandson to provide supervision/min assist  Possible Resolutions to Barriers: SNF recommended     Medical Summary Current Status: BP improved , urinary retention with foley, stool incont, widespread pain, sleep variable  Barriers to Discharge: Medical stability;Neurogenic Bowel & Bladder   Possible Resolutions to Barriers/Weekly Focus: increase neuro stimulant, cont appetite stim   Continued Need for Acute Rehabilitation Level of Care: The patient requires daily medical management by a physician with specialized training in physical medicine and rehabilitation for the following reasons: Direction of a multidisciplinary physical rehabilitation program to maximize functional independence : Yes Medical management of patient stability for increased activity during participation in an intensive rehabilitation regime.: Yes Analysis of laboratory values and/or radiology reports with any subsequent need for medication adjustment and/or medical intervention. : Yes   I attest that I was present, lead the team conference, and concur with the assessment and plan of the team.   Chana Bode B 07/06/2021, 2:14 PM

## 2021-07-06 NOTE — Plan of Care (Signed)
Problem: RH Balance Goal: LTG Patient will maintain dynamic standing with ADLs (OT) Description: LTG:  Patient will maintain dynamic standing balance with assist during activities of daily living (OT)  Flowsheets (Taken 07/06/2021 1231) LTG: Pt will maintain dynamic standing balance during ADLs with: (Goal downgraded due to slower than anticipated progress.) Moderate Assistance - Patient 50 - 74% Note: Goal downgraded due to slower than anticipated progress.   Problem: Sit to Stand Goal: LTG:  Patient will perform sit to stand in prep for activites of daily living with assistance level (OT) Description: LTG:  Patient will perform sit to stand in prep for activites of daily living with assistance level (OT) Flowsheets (Taken 07/06/2021 1231) LTG: PT will perform sit to stand in prep for activites of daily living with assistance level: (Goal downgraded due to slower than anticipated progress.) Moderate Assistance - Patient 50 - 74% Note: Goal downgraded due to slower than anticipated progress.   Problem: RH Bathing Goal: LTG Patient will bathe all body parts with assist levels (OT) Description: LTG: Patient will bathe all body parts with assist levels (OT) Flowsheets (Taken 07/06/2021 1231) LTG: Pt will perform bathing with assistance level/cueing: (Goal downgraded due to slower than anticipated progress.) Moderate Assistance - Patient 50 - 74% Note: Goal downgraded due to slower than anticipated progress.   Problem: RH Dressing Goal: LTG Patient will perform upper body dressing (OT) Description: LTG Patient will perform upper body dressing with assist, with/without cues (OT). Flowsheets (Taken 07/06/2021 1231) LTG: Pt will perform upper body dressing with assistance level of: (Goal downgraded due to slower than anticipated progress.) Moderate Assistance - Patient 50 - 74% Note: Goal downgraded due to slower than anticipated progress. Goal: LTG Patient will perform lower body dressing w/assist  (OT) Description: LTG: Patient will perform lower body dressing with assist, with/without cues in positioning using equipment (OT) Flowsheets (Taken 07/06/2021 1231) LTG: Pt will perform lower body dressing with assistance level of: (Goal downgraded due to slower than anticipated progress.) Moderate Assistance - Patient 50 - 74% Note: Goal downgraded due to slower than anticipated progress.   Problem: RH Toileting Goal: LTG Patient will perform toileting task (3/3 steps) with assistance level (OT) Description: LTG: Patient will perform toileting task (3/3 steps) with assistance level (OT)  Flowsheets (Taken 07/06/2021 1231) LTG: Pt will perform toileting task (3/3 steps) with assistance level: (Goal downgraded due to slower than anticipated progress.) Moderate Assistance - Patient 50 - 74%   Problem: RH Functional Use of Upper Extremity Goal: LTG Patient will use RT/LT upper extremity as a (OT) Description: LTG: Patient will use right/left upper extremity as a stabilizer/gross assist/diminished/nondominant/dominant level with assist, with/without cues during functional activity (OT) Flowsheets (Taken 07/06/2021 1231) LTG: Pt will use upper extremity in functional activity with assistance level of: (Goal downgraded due to slower than anticipated progress.) Moderate Assistance - Patient 50 - 74% Note: Goal downgraded due to slower than anticipated progress.   Problem: RH Toilet Transfers Goal: LTG Patient will perform toilet transfers w/assist (OT) Description: LTG: Patient will perform toilet transfers with assist, with/without cues using equipment (OT) Flowsheets (Taken 07/06/2021 1231) LTG: Pt will perform toilet transfers with assistance level of: (Goal downgraded due to slower than anticipated progress.) Moderate Assistance - Patient 50 - 74% Note: Goal downgraded due to slower than anticipated progress.   Problem: RH Tub/Shower Transfers Goal: LTG Patient will perform tub/shower transfers  w/assist (OT) Description: LTG: Patient will perform tub/shower transfers with assist, with/without cues using equipment (OT) Flowsheets (  Taken 07/06/2021 1231) LTG: Pt will perform tub/shower stall transfers with assistance level of: (Goal downgraded due to slower than anticipated progress.) Moderate Assistance - Patient 50 - 74% Note: Goal downgraded due to slower than anticipated progress.   Problem: RH Furniture Transfers Goal: LTG Patient will perform furniture transfers w/assist (OT/PT) Description: LTG: Patient will perform furniture transfers  with assistance (OT/PT). Flowsheets (Taken 07/06/2021 1231) LTG: Pt will perform furniture transfers with assist:: (Goal downgraded due to slower than anticipated progress.) Moderate Assistance - Patient 50 - 74% Note: Goal downgraded due to slower than anticipated progress.

## 2021-07-06 NOTE — Progress Notes (Signed)
Occupational Therapy Weekly Progress Note  Patient Details  Name: Carlos Welch MRN: [id number] Date of Birth: [birth year]  Beginning of progress report period: June 29, 2021 End of progress report period: July 06, 2021  Today's Date: 07/06/2021 OT Individual Time: 0700-0753 OT Individual Time Calculation (min): 53 min    Patient has met 1 of 4 short term goals.  Pt has made slow progress this week towards LTG. Pt has demonstrated improved static sitting balance, ability to compensate for deficits, decreased bouts of verbal agitation, improved OOB activity tolerance to presently complete self-feeding and grooming at min A level, UB dressing at max A level, and toileting and LBD total A + 2 level. Pt cont to be primarily limited by ongoing feelings of discomfort, decreased R sided proprioception, R lean, and resistiveness/frequent refusal of therapy. Limited receptiveness and carryover of education. RUE and hand currently assessed at [place] level III and III respectively. Anticipate 24/7 S and mod physical assist required upon DC.   Patient continues to demonstrate the following deficits: muscle weakness, decreased cardiorespiratoy endurance, impaired timing and sequencing, abnormal tone, unbalanced muscle activation, decreased coordination, and decreased motor planning, decreased visual acuity, decreased attention to right and decreased motor planning, decreased initiation, decreased attention, decreased awareness, decreased problem solving, decreased safety awareness, decreased memory, delayed processing, and decreased proprioception RUE/RLE, and decreased sitting balance, decreased standing balance, decreased postural control, hemiplegia, and decreased balance strategies and therefore will continue to benefit from skilled OT intervention to enhance overall performance with BADL, iADL, and Reduce care partner burden.  Patient not progressing toward long term goals.  See goal revision..   Plan of care revisions: Downgraded to mod A ADL, bathroom transfers.  OT Short Term Goals Week 2:  OT Short Term Goal 1 (Week 2): Pt will tolerate completing ADL routine 2/3 sessions with no more than min VCs to redirect agitation. OT Short Term Goal 2 (Week 2): Pt will don pants with max A.+ LRAD. OT Short Term Goal 3 (Week 2): Pt will don shirt with mod A. OT Short Term Goal 4 (Week 2): Pt will complete toilet transfer with max A + LRAD.  Skilled Therapeutic Interventions/Progress Updates:    Pt received semi-reclined in bed, c/o ongoing feelings of discomfort, but unable to specify when prompted, initially agreeable to get dressed. Session focus on self-care retraining, activity tolerance, RUE NMR, functional transfers in prep for improved ADL/IADL/func mobility performance + decreased caregiver burden.  Came to sitting EOB with increased time and min A to scoot forward and prevent L LOB. Doffed shirt with max A to pull over head and remove LUE, donned flannel shirt with total A. Bathed UB with max A for thoroughness, as well. Noted to wash over pants vs abdomen with poor awareness. Pt frequently demands that therapist complete ADL/transfer for him, continued education on purpose of CIR, pt not receptive to education at this time. Completed 3 STS with max A, pt unable to completely achieve fully upright posture and maintain standing long enough to doff pants. Pt with poor frustration tolerance and required extended break to reduce verbal agitation. Returned to supine with max A. Total A + 2 to reposition and boost up in bed and to doff/don pants. Total A bed level to don B teds.   Self-fed from cup with S with use of straw. Min A for thoroughness of oral care with suction sponge, required increased time and cues to initiate.   Finally, completed 20 reps of soft sponge squeeze  with R hand, requires cues to utilize vision to compensate for RUE positioning/functional use.  SatO2 at 96% on RA, HR at  67 bpm.   Pt left with HOB above 30 degrees with bed alarm engaged, call bell in reach, and all immediate needs met. 4 bed rails up for safety.   Therapy Documentation Precautions:  Precautions Precautions: Fall Precaution Comments: R hemi, R inattention, decr R sensation, small subluxation of R shoulder, fluctuating agitation, hypertension, lightheadedness/ blurry vision/ nausea with change in position Restrictions Weight Bearing Restrictions: No  Pain: see session note   ADL: See Care Tool for more details.  Therapy/Group: Individual Therapy  [name] MS, OTR/L  07/06/2021, 6:37 AM

## 2021-07-06 NOTE — Progress Notes (Signed)
PROGRESS NOTE   Subjective/Complaints:  No issues overnite , pocketing food on right but appetite much improved per CNA still drowsy during mealtime   ROS: Denies CP, SOB, N/V/D  Objective:   No results found. No results for input(s): WBC, HGB, HCT, PLT in the last 72 hours.  Recent Labs    07/04/21 0757  NA 136  K 4.3  CL 100  CO2 28  GLUCOSE 102*  BUN 31*  CREATININE 1.19  CALCIUM 9.6     Intake/Output Summary (Last 24 hours) at 07/06/2021 0757 Last data filed at 07/06/2021 0604 Gross per 24 hour  Intake 680 ml  Output 1625 ml  Net -945 ml          Physical Exam: Vital Signs Blood pressure (!) 117/91, pulse 66, temperature 98.5 F (36.9 C), temperature source Oral, resp. rate 18, height $RemoveBe'6\' 2"'reIKKNEnw$  (1.88 m), weight 77 kg, SpO2 98 %.   General: No acute distress Mood and affect are appropriate Heart: Regular rate and rhythm no rubs murmurs or extra sounds Lungs: Clear to auscultation, breathing unlabored, no rales or wheezes Abdomen: Positive bowel sounds, soft nontender to palpation, nondistended Extremities: No clubbing, cyanosis, or edema  Motor: 5/5 in left deltoid, bicep, tricep, grip, hip flexor, knee extensors, ankle dorsiflexor and plantar flexor Right upper extremity: Shoulder abduction, elbow flexion/extension 3-/5, handgrip 3/5 RLE 3- hip knee ext synergy , 3- Right ankle PF Sensation diminished to light touch right side  Assessment/Plan: 1. Functional deficits which require 3+ hours per day of interdisciplinary therapy in a comprehensive inpatient rehab setting. Physiatrist is providing close team supervision and 24 hour management of active medical problems listed below. Physiatrist and rehab team continue to assess barriers to discharge/monitor patient progress toward functional and medical goals  Care Tool:  Bathing    Body parts bathed by patient: Abdomen, Chest, Right arm (UB only  today)   Body parts bathed by helper: Left arm     Bathing assist Assist Level: Total Assistance - Patient < 25%     Upper Body Dressing/Undressing Upper body dressing   What is the patient wearing?: Pull over shirt    Upper body assist Assist Level: Maximal Assistance - Patient 25 - 49%    Lower Body Dressing/Undressing Lower body dressing      What is the patient wearing?: Incontinence brief, Pants     Lower body assist Assist for lower body dressing: 2 Helpers     Toileting Toileting    Toileting assist Assist for toileting: 2 Helpers     Transfers Chair/bed transfer  Transfers assist  Chair/bed transfer activity did not occur: Safety/medical concerns  Chair/bed transfer assist level: 2 Helpers     Locomotion Ambulation   Ambulation assist   Ambulation activity did not occur: Safety/medical concerns  Assist level: Moderate Assistance - Patient 50 - 74% Assistive device: Other (comment) (hallway handrail) Max distance: 38 ft   Walk 10 feet activity   Assist  Walk 10 feet activity did not occur: Safety/medical concerns  Assist level: Moderate Assistance - Patient - 50 - 74% Assistive device: Other (comment) (L hallway handrail)   Walk 50 feet activity  Assist Walk 50 feet with 2 turns activity did not occur: Safety/medical concerns         Walk 150 feet activity   Assist Walk 150 feet activity did not occur: Safety/medical concerns         Walk 10 feet on uneven surface  activity   Assist Walk 10 feet on uneven surfaces activity did not occur: Safety/medical concerns         Wheelchair     Assist Is the patient using a wheelchair?: Yes (Per PT long term goals) Type of Wheelchair: Manual Wheelchair activity did not occur: Safety/medical concerns         Wheelchair 50 feet with 2 turns activity    Assist    Wheelchair 50 feet with 2 turns activity did not occur: Safety/medical concerns       Wheelchair  150 feet activity     Assist  Wheelchair 150 feet activity did not occur: Safety/medical concerns       Blood pressure (!) 117/91, pulse 66, temperature 98.5 F (36.9 C), temperature source Oral, resp. rate 18, height $RemoveBe'6\' 2"'jGYGEAXip$  (1.88 m), weight 77 kg, SpO2 98 %.  Medical Problem List and Plan: 1.  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 Formulized individual treatment plan based on medical history, underlying problem and comorbidities.  2.  Antithrombotics: -DVT/anticoagulation:  Mechanical: Sequential compression devices, below knee Bilateral lower extremities             -antiplatelet therapy: N/A 3. Pain: continue Neurontin 100 mg nightly, hydrocodone as needed  Controlled on 9/21 4. Mood: Provide emotional support             -antipsychotic agents: N/A 5. Neuropsych: This patient is capable of making decisions on his own behalf. 6. Skin/Wound Care: Routine skin checks- added lac hydrin  7. Fluids/Electrolytes/Nutrition: Routine in and outs Continue megestrol  8.  Post stroke dysphagia.  Dysphagia #2 nectar thick liquids.  Follow-up speech therapy, poor intake due to lethargy now on appetite stimulant and neurostimulant   Advance diet as tolerated- responding well intake improved  9.  Hypertension.  Hydralazine 50 mg every 8 hours, Cozaar 100 mg daily.  Monitor with increased mobility Vitals:   07/05/21 1927 07/06/21 0456  BP: 117/82 (!) 117/91  Pulse: (!) 57 66  Resp: 18 18  Temp: 98.8 F (37.1 C) 98.5 F (36.9 C)  SpO2: 98% 98%  Lasix, hydralazine, Cozaar decreased  controlled on 9/21 10.  Combined congestive heart failure.  Lasix decreased from $RemoveBefore'40mg'qSHwZxkprurDq$  to $R'20mg'Wo$    BUN still elevated but improving , no signs of  decompensation  11.  Chronic bilateral lower extremity edema/cellulitis.  Continue Lasix.  Complete course of doxycycline 12.  Atrial fibrillation.  Eliquis held due to [place].  Cardiac rate controlled 13.  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  Follow-up cardiology services 14.  Hyperlipidemia.  Currently holding statin due to [place] 15.  Chronic thrombocytopenia.    Platelets 142 on 9/16 16.  Lethargy: Multifactorial  Repeat CT showed no new bleed or infarct, CBC normal and afebrile to doubt infection, Thalamic strokes often cause problems with arousal Methylphenidate initiated improved initiation and participation  Increase methylphenidate  LOS: 9 days A FACE TO FACE EVALUATION WAS PERFORMED  Carlos Welch 07/06/2021, 7:57 AM

## 2021-07-07 MED ORDER — LIDOCAINE HCL URETHRAL/MUCOSAL 2 % EX GEL: 1.0000 "application " | CUTANEOUS | Status: DC | PRN

## 2021-07-07 MED ORDER — TAMSULOSIN HCL 0.4 MG PO CAPS
0.4000 mg | ORAL_CAPSULE | Freq: Every day | ORAL | Status: DC
  Administered 2021-07-07 – 2021-07-14 (×8): 0.4 mg via ORAL
  Filled 2021-07-07 (×8): qty 1

## 2021-07-07 MED ORDER — LIDOCAINE HCL URETHRAL/MUCOSAL 2 % EX GEL: 1.0000 "application " | Freq: Once | CUTANEOUS | Status: DC

## 2021-07-07 NOTE — Progress Notes (Signed)
PROGRESS NOTE   Subjective/Complaints:  No new issues overnite, discussed repeat voiding trial which pt would like as well  ROS: Denies CP, SOB, N/V/D  Objective:   No results found. No results for input(s): WBC, HGB, HCT, PLT in the last 72 hours.  Recent Labs    07/04/21 0757  NA 136  K 4.3  CL 100  CO2 28  GLUCOSE 102*  BUN 31*  CREATININE 1.19  CALCIUM 9.6     Intake/Output Summary (Last 24 hours) at 07/07/2021 0709 Last data filed at 07/06/2021 1734 Gross per 24 hour  Intake 720 ml  Output 500 ml  Net 220 ml          Physical Exam: Vital Signs Blood pressure (!) 131/91, pulse (!) 54, temperature 99.6 F (37.6 C), temperature source Oral, resp. rate 14, height 6\' 2"  (1.88 m), weight 77 kg, SpO2 98 %.  General: No acute distress Mood and affect are appropriate Heart: Regular rate and rhythm no rubs murmurs or extra sounds Lungs: Clear to auscultation, breathing unlabored, no rales or wheezes Abdomen: Positive bowel sounds, soft nontender to palpation, nondistended Extremities: No clubbing, cyanosis, or edema Motor: 5/5 in left deltoid, bicep, tricep, grip, hip flexor, knee extensors, ankle dorsiflexor and plantar flexor Right upper extremity: Shoulder abduction, elbow flexion/extension 3-/5, handgrip 3/5 RLE 3- hip knee ext synergy , 3- Right ankle PF Sensation diminished to light touch right side GU- foley draining clear amber urine Assessment/Plan: 1. Functional deficits which require 3+ hours per day of interdisciplinary therapy in a comprehensive inpatient rehab setting. Physiatrist is providing close team supervision and 24 hour management of active medical problems listed below. Physiatrist and rehab team continue to assess barriers to discharge/monitor patient progress toward functional and medical goals  Care Tool:  Bathing    Body parts bathed by patient: Abdomen, Chest, Right arm (UB  only today)   Body parts bathed by helper: Left arm     Bathing assist Assist Level: Total Assistance - Patient < 25%     Upper Body Dressing/Undressing Upper body dressing   What is the patient wearing?: Pull over shirt    Upper body assist Assist Level: Maximal Assistance - Patient 25 - 49%    Lower Body Dressing/Undressing Lower body dressing      What is the patient wearing?: Incontinence brief, Pants     Lower body assist Assist for lower body dressing: 2 Helpers     Toileting Toileting    Toileting assist Assist for toileting: 2 Helpers     Transfers Chair/bed transfer  Transfers assist  Chair/bed transfer activity did not occur: Safety/medical concerns  Chair/bed transfer assist level: 2 Helpers     Locomotion Ambulation   Ambulation assist   Ambulation activity did not occur: Safety/medical concerns  Assist level: Moderate Assistance - Patient 50 - 74% Assistive device: Other (comment) (hallway handrail) Max distance: 38 ft   Walk 10 feet activity   Assist  Walk 10 feet activity did not occur: Safety/medical concerns  Assist level: Moderate Assistance - Patient - 50 - 74% Assistive device: Other (comment) (L hallway handrail)   Walk 50 feet activity   Assist  Walk 50 feet with 2 turns activity did not occur: Safety/medical concerns         Walk 150 feet activity   Assist Walk 150 feet activity did not occur: Safety/medical concerns         Walk 10 feet on uneven surface  activity   Assist Walk 10 feet on uneven surfaces activity did not occur: Safety/medical concerns         Wheelchair     Assist Is the patient using a wheelchair?: Yes (Per PT long term goals) Type of Wheelchair: Manual Wheelchair activity did not occur: Safety/medical concerns         Wheelchair 50 feet with 2 turns activity    Assist    Wheelchair 50 feet with 2 turns activity did not occur: Safety/medical concerns        Wheelchair 150 feet activity     Assist  Wheelchair 150 feet activity did not occur: Safety/medical concerns       Blood pressure (!) 131/91, pulse (!) 54, temperature 99.6 F (37.6 C), temperature source Oral, resp. rate 14, height 6\' 2"  (1.88 m), weight 77 kg, SpO2 98 %.  Medical Problem List and Plan: 1.  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  Antithrombotics: -DVT/anticoagulation:  Mechanical: Sequential compression devices, below knee Bilateral lower extremities             -antiplatelet therapy: N/A 3. Pain: continue Neurontin 100 mg nightly, hydrocodone as needed  Controlled on 9/22 4. Mood: Provide emotional support             -antipsychotic agents: N/A 5. Neuropsych: This patient is capable of making decisions on his own behalf. 6. Skin/Wound Care: Routine skin checks- added lac hydrin  7. Fluids/Electrolytes/Nutrition: Routine in and outs Continue megestrol  8.  Post stroke dysphagia.  Dysphagia #2 nectar thick liquids.  Follow-up speech therapy, poor intake due to lethargy now on appetite stimulant and neurostimulant   Advance diet as tolerated- responding well intake improved  9.  Hypertension.  Hydralazine 50 mg every 8 hours, Cozaar 100 mg daily.  Monitor with increased mobility Vitals:   07/06/21 2007 07/07/21 0546  BP: 129/88 (!) 131/91  Pulse: 70 (!) 54  Resp: 15 14  Temp: 97.8 F (36.6 C) 99.6 F (37.6 C)  SpO2: 99% 98%  Lasix, hydralazine, Cozaar decreased  controlled on 9/21 10.  Combined congestive heart failure.  Lasix decreased from 40mg  to 20mg    BUN still elevated but improving , no signs of decompensation  11.  Chronic bilateral lower extremity edema/cellulitis.  Continue Lasix.  Complete course of doxycycline 12.  Atrial fibrillation.  Eliquis held due to ICH.  Cardiac rate controlled 13.  CAD with CABG/aortic stenosis  status post aortic valve replacement.  Follow-up cardiology services 14.  Hyperlipidemia.  Currently holding statin due to ICH 15.  Chronic thrombocytopenia.    Platelets 142 on 9/16 16.  Lethargy: Multifactorial  Repeat CT showed no new bleed or infarct, CBC normal and afebrile to doubt infection, Thalamic strokes often cause problems with arousal Methylphenidate initiated improved initiation and participation  Increase methylphenidate to 10mg  BID 17.  Urinary retention, was on I/O caths but changed to foley after hematuria, urine now clear will do another voiding trial prior to SNF, resume flomax tonite  LOS: 10 days A FACE TO FACE EVALUATION WAS PERFORMED   E Delmore Sear 07/07/2021, 7:09 AM

## 2021-07-07 NOTE — Progress Notes (Signed)
Physical Therapy Session Note  Patient Details  Name: Carlos Welch MRN: [id number] Date of Birth: [birth year]  Today's Date: 07/07/2021 PT Individual Time: 1530-1555 PT Individual Time Calculation (min): 25 min   Short Term Goals:  Week 2:  PT Short Term Goal 1 (Week 2): Pt will perform all bed mobility with consistent CGA. PT Short Term Goal 2 (Week 2): Pt will perform sit <> stand with consistent Min A PT Short Term Goal 3 (Week 2): Pt will perform funcitonal seat to seat transfers with Min A. PT Short Term Goal 4 (Week 2): Pt will ambulate at least 50 feet with consistent Mod A. PT Short Term Goal 5 (Week 2): Pt will perform dynamic sitting balance tasks with CGA.  Skilled Therapeutic Interventions/Progress Updates:   Pt received supine in bed and agreeable to PT. Supine>sit transfer with mod assist at trunk and cues for log roll technique, but pt noted to perform rotation prior attempting sit from reclined position across bed regardless of instruction. Sitting balance EOB with supervision assist x 4 min while using LUE to adjust sheets and call bell position on EOB. Pt performed sit<>stand from elevated height x 3 with mod assist progressing to min assist with RW and R hand splint. In standing pt performed side stepping to the R x 5 steps  with mod-max assist from PT to prevent LOB to the R as well as max multimodal instruction for awareness of the RLE. Pt repeatedly saying I dont know which leg you want me to move. But eventually able to move volitionally. Standing balance 2 x 30sec with min-mod assist to improve awareness of midline. Once pt near [facility], Sit>supine completed with min A* at RUE for safety then mod assist for scooting in supine to [facility] and left supine in bed with call bell in reach and all needs met.       Therapy Documentation Precautions:  Precautions Precautions: Fall Precaution Comments: R hemi, R inattention, decr R sensation, small subluxation of R shoulder,  fluctuating agitation, hypertension, lightheadedness/ blurry vision/ nausea with change in position Restrictions Weight Bearing Restrictions: No    Vital Signs: Therapy Vitals Temp: 98.4 F (36.9 C) Temp Source: Oral Pulse Rate: 65 Resp: 16 BP: 126/90 Patient Position (if appropriate): Lying Oxygen Therapy SpO2: 100 % O2 Device: Room Air Pain: Pain Assessment Pain Scale: 0-10 Pain Score: 0-No pain    Therapy/Group: Individual Therapy  Lorie Phenix 07/07/2021, 4:02 PM

## 2021-07-07 NOTE — Plan of Care (Addendum)
  Problem: RH BOWEL ELIMINATION Goal: RH STG MANAGE BOWEL WITH ASSISTANCE Description: STG Manage Bowel with min Assistance. Outcome: Not Progressing; incontinence   Problem: RH BLADDER ELIMINATION Goal: RH STG MANAGE BLADDER WITH ASSISTANCE Description: STG Manage Bladder With min Assistance Outcome: for trial voiding

## 2021-07-07 NOTE — Progress Notes (Signed)
Patient ID: Carlos Welch, male   DOB: 1946/08/21, 75 y.o.   MRN: [id number]  SW left VM with daughter to follow up on e-mail address.  [place], [place] [phone]

## 2021-07-07 NOTE — Progress Notes (Signed)
Occupational Therapy Session Note  Patient Details  Name: Carlos Welch MRN: [id number] Date of Birth: Mar 31, 1946  Today's Date: 07/07/2021 OT Individual Time: 0254-2706 OT Individual Time Calculation (min): 53 min    Short Term Goals: Week 2:  OT Short Term Goal 1 (Week 2): Pt will tolerate completing ADL routine 2/3 sessions with no more than min VCs to redirect agitation. OT Short Term Goal 2 (Week 2): Pt will don pants with max A.+ LRAD. OT Short Term Goal 3 (Week 2): Pt will don shirt with mod A. OT Short Term Goal 4 (Week 2): Pt will complete toilet transfer with max A + LRAD.  Skilled Therapeutic Interventions/Progress Updates:     Pt received asleep in bed, easily awakened to verbal cues, agreeable to therapy. Pt benefits from simple, direct cuing to encourage participation. Session focus on self-care retraining, activity tolerance, RUE NMR, functional transfers in prep for improved ADL/IADL/func mobility performance + decreased caregiver burden. Came to sitting EOB with mod A to lift trunk and progress RLE off bed. Squat-pivot x3 to and from w/c throughout session with max A + 2 for progression. Pt with difficulty following cues to assist. Total A w/c transport to and from gym. Completed massed practice of sit to stand with RW + R saddle splint + mod of 2 to power up and max of 2 to prevent R lean, does progress to maintaining static standing with intermittent mod A for 5 seconds at a time. Requires assist to manage RLE placement due to decreased propioception. Total A + 2 to boost up in bed.  With RUE, completed 1x10 biceps curls, shoulder flexion, and R scapular elevation. Required max physical A to complete ROM.  Completed oral care with S and only min cuing to keep thumb over suction whole, self-drank from cup with straw with S and no spillage.  Pt left with HOB over 30 degrees with bed alarm engaged, call bell in reach, and all immediate needs met. 4 bed rails up for  safety.  Therapy Documentation Precautions:  Precautions Precautions: Fall Precaution Comments: R hemi, R inattention, decr R sensation, small subluxation of R shoulder, fluctuating agitation, hypertension, lightheadedness/ blurry vision/ nausea with change in position Restrictions Weight Bearing Restrictions: No  Pain: no s/sx   ADL: See Care Tool for more details.  Therapy/Group: Individual Therapy  [name] MS, OTR/L  07/07/2021, 6:43 AM

## 2021-07-07 NOTE — Progress Notes (Signed)
Speech Language Pathology Daily Session Note  Patient Details  Name: Carlos Welch MRN: [id number] Date of Birth: 1946-09-19  Today's Date: 07/07/2021 SLP Individual Time: 1300-1330 SLP Individual Time Calculation (min): 30 min  Short Term Goals: Week 2: SLP Short Term Goal 1 (Week 2): Patient will orient x4 with mod A verbal cues for use of external aids SLP Short Term Goal 2 (Week 2): Patient will demonstrate sustained attention to functional tasks for 5 minute duration with mod A verbal and visual cues SLP Short Term Goal 3 (Week 2): Patient will complete functional sequencing tasks with max A verbal and visual cues SLP Short Term Goal 4 (Week 2): Patient will follow 1 step directions with mod A verbal cues SLP Short Term Goal 5 (Week 2): Patient will consume current diet with improved self-monitoring of pocketing and anterior labial spillage with mod A verbal and visual cues to implement compensatory strategies  Skilled Therapeutic Interventions: Pt greeted upright in bed and agreeable to skilled ST intervention with focus on swallowing goals. Patient was consuming noon meal on arrival while receiving supervision from NT who reported pt required intermittent cues to clear pocketing from right buccal cavity. SLP assessed implementation of swallow strategies in which patient implemented effectively with min A verbal cues. Following initial cues, he was observed executing lingual sweep to clear oral residue and on outer lip secondary to min anterior labial spillage on right. SLP facilitated oral care using suction toothbrush with min A for thoroughness to clear right pocketing and reapplied denture adhesive to bottom denture. Patient was left in bed with alarm activated and immediate needs within reach at end of session. Continue per current plan of care.      Pain Pain Assessment Pain Scale: 0-10 Pain Score: 0-No pain  Therapy/Group: Individual Therapy  Rodolphe Edmonston T Alaynna Kerwood 07/07/2021, 1:16  PM

## 2021-07-07 NOTE — Progress Notes (Signed)
Physical Therapy Session Note  Patient Details  Name: Carlos Welch MRN: [id number] Date of Birth: 10-Jan-1946  Today's Date: 07/07/2021 PT Individual Time: 0800-0910 PT Individual Time Calculation (min): 70 min   Short Term Goals: Week 1:  PT Short Term Goal 1 (Week 1): Pt will perform all bed mobility with consistent CGA. PT Short Term Goal 2 (Week 1): Pt will perform dynamic sitting balance tasks with Min A. PT Short Term Goal 3 (Week 1): Pt will perform sit <> stand with Mod A. PT Short Term Goal 4 (Week 1): Pt will initiate gait training with LRAD. PT Short Term Goal 5 (Week 1): Pt will decrease complaint of lightheadedness/ dizziness with mobility by 50%. Week 2:  PT Short Term Goal 1 (Week 2): Pt will perform all bed mobility with consistent CGA. PT Short Term Goal 2 (Week 2): Pt will perform sit <> stand with consistent Min A PT Short Term Goal 3 (Week 2): Pt will perform funcitonal seat to seat transfers with Min A. PT Short Term Goal 4 (Week 2): Pt will ambulate at least 50 feet with consistent Mod A. PT Short Term Goal 5 (Week 2): Pt will perform dynamic sitting balance tasks with CGA. Week 3:     Skilled Therapeutic Interventions/Progress Updates:   Pt received supine in bed and agreeable to PT. Supine>sit transfer with min assist at trunk and cues for awareness of the RUE. Sitting balance EOB with supervision assist once BLE on floor x 2 min. 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 Sitting balance on toilet with supervision assist- min assist to pt to have BM and prevent pushing response to discomfort. Pt able to void bowel. Peri care with total A* and max assist to maintain balance as RLE noted to remain in flexed position in air.   Pt transported to rail in hall. Sit<>stand x 4 with min-mod assist and moderate cues for sequencing. Pregait stepping with visual feedback frim mirror x 5 BLE  and mod assist to maintain balance from PT.   Gait  training at rail  in hall with max assist from PT to assist with midline and improved step width on the RLE as wella s +2 for WC follow. Max cues for improved posture and use of visual feedbakc from [employer].  Dynamic gait training in parallel bars side stepping R and L with BUE supported on rails 35f x2 bil and then forward/reverse 624fx 1 with max assist from PT for improved positioning of the RLE due to severe proprioceptive.   Pt transported to room. Sitting balance in WC to performed lateral reach to the L and then forrward reach while maintaining midline. Min assist for improved weight shift L and attention to task throughout. [facility] and all needs met.        Therapy Documentation Precautions:  Precautions Precautions: Fall Precaution Comments: R hemi, R inattention, decr R sensation, small subluxation of R shoulder, fluctuating agitation, hypertension, lightheadedness/ blurry vision/ nausea with change in position Restrictions Weight Bearing Restrictions: No    Vital Signs: Therapy Vitals Temp: 99.6 F (37.6 C) Temp Source: Oral Pulse Rate: (!) 54 Resp: 14 BP: (!) 131/91 Patient Position (if appropriate): Lying Oxygen Therapy SpO2: 98 % O2 Device: Room Air Pain:   denies   Therapy/Group: Individual Therapy  AuLorie Phenix/22/2022, 9:23 AM

## 2021-07-07 NOTE — Plan of Care (Signed)
  Problem: RH Car Transfers Goal: LTG Patient will perform car transfers with assist (PT) Description: LTG: Patient will perform car transfers with assistance (PT). Outcome: Not Applicable Note: Downgraded due to Lack of progress   Problem: RH Stairs Goal: LTG Patient will ambulate up and down stairs w/assist (PT) Description: LTG: Patient will ambulate up and down # of stairs with assistance (PT) Outcome: Not Applicable Note: Downgraded due to Lack of progress   Problem: RH Bed Mobility Goal: LTG Patient will perform bed mobility with assist (PT) Description: LTG: Patient will perform bed mobility with assistance, with/without cues (PT). Flowsheets (Taken 07/07/2021 1604) LTG: Pt will perform bed mobility with assistance level of: Minimal Assistance - Patient > 75%   Problem: RH Ambulation Goal: LTG Patient will ambulate in controlled environment (PT) Description: LTG: Patient will ambulate in a controlled environment, # of feet with assistance (PT). Flowsheets (Taken 07/07/2021 1604) LTG: Pt will ambulate in controlled environ  assist needed:: Moderate Assistance - Patient 50 - 74% LTG: Ambulation distance in controlled environment: 23ft Goal: LTG Patient will ambulate in home environment (PT) Description: LTG: Patient will ambulate in home environment, # of feet with assistance (PT). Flowsheets (Taken 07/07/2021 1604) LTG: Pt will ambulate in home environ  assist needed:: Moderate Assistance - Patient 50 - 74% LTG: Ambulation distance in home environment: 6ft with LRAD   Problem: RH Wheelchair Mobility Goal: LTG Patient will propel w/c in controlled environment (PT) Description: LTG: Patient will propel wheelchair in controlled environment, # of feet with assist (PT) Flowsheets Taken 07/07/2021 1604 by Golden Pop, PT LTG: Pt will propel w/c in controlled environ  assist needed:: Minimal Assistance - Patient > 75% Taken 06/28/2021 1716 by Sharol Harness A, PT LTG: Propel w/c  distance in controlled environment: 150 feet Note: Downgraded due to Lack of progress Goal: LTG Patient will propel w/c in home environment (PT) Description: LTG: Patient will propel wheelchair in home environment, # of feet with assistance (PT). Flowsheets (Taken 07/07/2021 1604) LTG: Pt will propel w/c in home environ  assist needed:: Minimal Assistance - Patient > 75% Distance: wheelchair distance in controlled environment: 50 Note: Downgraded due to Lack of progress

## 2021-07-07 NOTE — Progress Notes (Signed)
Recreational Therapy Session Note  Patient Details  Name: MCLEAN MOYA MRN: [id number] Date of Birth: 11-17-1945 Today's Date: 07/07/2021 Skilled Therapeutic Interventions/Progress Updates: Pt seated in w/c upon arrival, restless and frustrated that he was in the w/c, was uncomfortable, requesting to go to bed.  Nursing in to assist-utilized steady with +2 assist for safety.  Pt required min-mod verbal cues for safety and to remain calm with transfer. With continued discussion, pt became more frustrated.  Pt requesting to rest.   TR eval deferred at this time.  Will continue to monitor. Aylssa Herrig 07/07/2021, 1:21 PM

## 2021-07-08 NOTE — Progress Notes (Signed)
PROGRESS NOTE   Subjective/Complaints: Patient seen sitting up in bed this morning.  He states he slept fairly overnight.  He denies complaints.  ROS: Denies CP, SOB, N/V/D  Objective:   No results found. No results for input(s): WBC, HGB, HCT, PLT in the last 72 hours.  No results for input(s): NA, K, CL, CO2, GLUCOSE, BUN, CREATININE, CALCIUM in the last 72 hours.   Intake/Output Summary (Last 24 hours) at 07/08/2021 1300 Last data filed at 07/08/2021 0848 Gross per 24 hour  Intake 476 ml  Output 350 ml  Net 126 ml          Physical Exam: Vital Signs Blood pressure (!) 122/105, pulse 72, temperature 97.8 F (36.6 C), temperature source Oral, resp. rate 16, height 6\' 2"  (1.88 m), weight 77 kg, SpO2 99 %. Constitutional: No distress . Vital signs reviewed. HENT: Normocephalic.  Atraumatic. Eyes: EOMI. No discharge. Cardiovascular: No JVD.  RRR. Respiratory: Normal effort.  No stridor.  Bilateral clear to auscultation. GI: Non-distended.  BS +. Skin: Warm and dry.  Intact. Psych: Normal mood.  Normal behavior. Musc: No edema in extremities.  No tenderness in extremities. Neuro: Alert Motor: 5/5 in left deltoid, bicep, tricep, grip, hip flexor, knee extensors, ankle dorsiflexor and plantar flexor Right upper extremity: Shoulder abduction, elbow flexion/extension 3-/5, handgrip 3/5, limited by spasticity RLE 3- hip knee ext synergy , 3- Right ankle PF  Assessment/Plan: 1. Functional deficits which require 3+ hours per day of interdisciplinary therapy in a comprehensive inpatient rehab setting. Physiatrist is providing close team supervision and 24 hour management of active medical problems listed below. Physiatrist and rehab team continue to assess barriers to discharge/monitor patient progress toward functional and medical goals  Care Tool:  Bathing    Body parts bathed by patient: Abdomen, Chest, Right arm,  Face   Body parts bathed by helper: Left arm     Bathing assist Assist Level: Moderate Assistance - Patient 50 - 74%     Upper Body Dressing/Undressing Upper body dressing   What is the patient wearing?: Pull over shirt    Upper body assist Assist Level: Maximal Assistance - Patient 25 - 49%    Lower Body Dressing/Undressing Lower body dressing      What is the patient wearing?: Incontinence brief, Pants     Lower body assist Assist for lower body dressing: 2 Helpers     Toileting Toileting    Toileting assist Assist for toileting: 2 Helpers (stedy)     Transfers Chair/bed transfer  Transfers assist  Chair/bed transfer activity did not occur: Safety/medical concerns  Chair/bed transfer assist level: 2 Helpers     Locomotion Ambulation   Ambulation assist   Ambulation activity did not occur: Safety/medical concerns  Assist level: Moderate Assistance - Patient 50 - 74% Assistive device: Other (comment) (hallway handrail) Max distance: 38 ft   Walk 10 feet activity   Assist  Walk 10 feet activity did not occur: Safety/medical concerns  Assist level: Moderate Assistance - Patient - 50 - 74% Assistive device: Other (comment) (L hallway handrail)   Walk 50 feet activity   Assist Walk 50 feet with 2 turns activity did  not occur: Safety/medical concerns         Walk 150 feet activity   Assist Walk 150 feet activity did not occur: Safety/medical concerns         Walk 10 feet on uneven surface  activity   Assist Walk 10 feet on uneven surfaces activity did not occur: Safety/medical concerns         Wheelchair     Assist Is the patient using a wheelchair?: Yes (Per PT long term goals) Type of Wheelchair: Manual Wheelchair activity did not occur: Safety/medical concerns         Wheelchair 50 feet with 2 turns activity    Assist    Wheelchair 50 feet with 2 turns activity did not occur: Safety/medical concerns        Wheelchair 150 feet activity     Assist  Wheelchair 150 feet activity did not occur: Safety/medical concerns       Blood pressure (!) 122/105, pulse 72, temperature 97.8 F (36.6 C), temperature source Oral, resp. rate 16, height 6\' 2"  (1.88 m), weight 77 kg, SpO2 99 %.  Medical Problem List and Plan: 1.  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  Antithrombotics: -DVT/anticoagulation:  Mechanical: Sequential compression devices, below knee Bilateral lower extremities             -antiplatelet therapy: N/A 3. Pain: continue Neurontin 100 mg nightly, hydrocodone as needed  Controlled on 9/23 4. Mood: Provide emotional support             -antipsychotic agents: N/A 5. Neuropsych: This patient is capable of making decisions on his own behalf. 6. Skin/Wound Care: Routine skin checks- added lac hydrin  7. Fluids/Electrolytes/Nutrition: Routine in and outs Continue megestrol  8.  Post stroke dysphagia.  Dysphagia #2 nectar thick liquids.  Follow-up speech therapy, poor intake due to lethargy now on appetite stimulant and neurostimulant   Advance diet as tolerated 9.  Hypertension.  Hydralazine 50 mg every 8 hours, Cozaar 100 mg daily.  Monitor with increased mobility Vitals:   07/07/21 2044 07/08/21 0500  BP: (!) 121/93 (!) 122/105  Pulse: 72   Resp:    Temp: 98.5 F (36.9 C) 97.8 F (36.6 C)  SpO2: 98% 99%  Lasix, hydralazine, Cozaar decreased  Relatively controlled on 9/23 10.  Combined congestive heart failure.  Lasix decreased from 40mg  to 20mg   Filed Weights   06/27/21 1642 07/05/21 0331 07/06/21 0456  Weight: 75.2 kg 77.2 kg 77 kg   11.  Chronic bilateral lower extremity edema/cellulitis.  Continue Lasix.  Complete course of doxycycline 12.  Atrial fibrillation.  Eliquis held due to ICH.  Cardiac rate controlled 13.  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  Follow-up cardiology services 14.  Hyperlipidemia.  Currently holding statin due to ICH 15.  Chronic thrombocytopenia.    Platelets 142 on 9/16, labs ordered for Monday 16.  Lethargy: Multifactorial  Repeat CT showed no new bleed or infarct, CBC normal and afebrile to doubt infection, Thalamic strokes often cause problems with arousal Methylphenidate initiated improved initiation and participation  Increase methylphenidate to 10mg  BID 17.  Urinary retention, was on I/O caths but changed to foley after hematuria, urine now clear will do another voiding trial prior to SNF, resume flomax   LOS: 11 days A FACE TO FACE EVALUATION WAS PERFORMED  Carlos Welch 07/08/21 07/08/2021, 1:00 PM

## 2021-07-08 NOTE — Progress Notes (Signed)
Occupational Therapy Session Note  Patient Details  Name: Carlos Welch MRN: [id number] Date of Birth: Feb 22, 1946  Today's Date: 07/08/2021 OT Individual Time: 7782-4235 OT Individual Time Calculation (min): 68 min + 24 min   Short Term Goals: Week 2:  OT Short Term Goal 1 (Week 2): Pt will tolerate completing ADL routine 2/3 sessions with no more than min VCs to redirect agitation. OT Short Term Goal 2 (Week 2): Pt will don pants with max A.+ LRAD. OT Short Term Goal 3 (Week 2): Pt will don shirt with mod A. OT Short Term Goal 4 (Week 2): Pt will complete toilet transfer with max A + LRAD.  Skilled Therapeutic Interventions/Progress Updates:    Session 1 [phone]): Pt received awake in bed, no c/o pain, agreeable to therapy. Session focus on self-care retraining, activity tolerance, RUE NMR in prep for improved ADL/IADL/func mobility performance + decreased caregiver burden. Requesting to use toilet. Came to sitting EOB with mod A to lift trunk, prefers to exit on L side of bed. Stedy transfer with mod A to power up and to maintain R grasp on rail > toilet. Total A for LB clothing management and posterior pericare post continent void of bowel. Pt loses balance with only unilateral UE support when attempting to pull down brief. Stedy transfer > w/c with mod A to prevent R lean and to power up. Donned pants with total A +2 for sit to stand, pt able to assist by pulling up pants over knees. Multiple attempts for standing due to heavy R lean. Seated at sink. Pt bathed UB with mod A for thoroughness and to bathe RUE. Donned shirt with max A , pt able to pull down in front/over head this date. Hand-over-hand assist to apply deodorant with R hand. Max A to comb hair thoroughly, pt only combing L side. Frequent reminders throughout session to utilize vision to compensate for decreased R sided sensation/propioception.   Pt self-propelled w/c to gym with intermittent mod A to facilitate hemi-technique,  requires assist to scoot back in chair and maintain upright posture.   Pt completed 1x10 R forward shoulder flexion/abduction/adduction with use of  BZ board and max physical assist to support RUE. Practiced rolling theraputty into log shape and pinching off pieces, as well with RUE.  Stedy transfer back to bed and returned to supine with max A, total A + 2 to boost. Pt with good participation and no verbal agitation this date/no resistance to use of stedy for transfer such as in previous sessions.   Pt left HOB over 30*, 4 bed rails up for safety, with bed alarm engaged, call bell in reach, and all immediate needs met.    Session 2 [phone]): Pt received in bed finishing lunch with NT present, no c/o pain, agreeable to therapy. Session focus on self-care retraining, activity tolerance, RUE NMR in prep for improved ADL/IADL/func mobility performance + decreased caregiver burden.  Pt self-fed remainder of lunch with S and minimal spillage. Opted to drink orange juice with use of spoon . Completed oral care with suction toothbrush with overall min A for thoroughness. Completed 1x15 of the following with RUE: resistive sponge squeezes, triceps extension and biceps curls against level 1 resistive theraband. Pt with no bouts of verbal agitation and good participation this session.   Pt left with HOB over 30 degrees with bed alarm engaged, call bell in reach, and all immediate needs met. 4 bed rails up for safety.    Therapy Documentation Precautions:  Precautions Precautions: Fall Precaution Comments: R hemi, R inattention, decr R sensation, small subluxation of R shoulder, fluctuating agitation, hypertension, lightheadedness/ blurry vision/ nausea with change in position Restrictions Weight Bearing Restrictions: No  Pain: see session notes   ADL: See Care Tool for more details.  Therapy/Group: Individual Therapy  [name] MS, OTR/L  07/08/2021, 6:39 AM

## 2021-07-08 NOTE — Progress Notes (Signed)
Physical Therapy Session Note  Patient Details  Name: Carlos Welch MRN: [id number] Date of Birth: 13-Apr-1946  Today's Date: 07/08/2021 PT Individual Time: 1505-1600 PT Individual Time Calculation (min): 55 min   Short Term Goals: Week 2:  PT Short Term Goal 1 (Week 2): Pt will perform all bed mobility with consistent CGA. PT Short Term Goal 2 (Week 2): Pt will perform sit <> stand with consistent Min A PT Short Term Goal 3 (Week 2): Pt will perform funcitonal seat to seat transfers with Min A. PT Short Term Goal 4 (Week 2): Pt will ambulate at least 50 feet with consistent Mod A. PT Short Term Goal 5 (Week 2): Pt will perform dynamic sitting balance tasks with CGA.  Skilled Therapeutic Interventions/Progress Updates:  Patient supine in bed asleep on entrance to room. Patient requires time to fully wake but is then alert and agreeable to PT session. Pt demos improved mood from last session with this therapist. Potentially linked to recent evacuation of bowels as pt does demonstrate some level of toilet/ bowel anxiety as well as difficulty with moving bowels d/t symptoms related to recent stroke.    Patient with no pain complaint at start of session. One small episode of pain complaint at very initiation of entering parallel bars and asking pt to adjust body position in w/c. Pt starts to increase anxiety and talked through pain toward movement and potential to decrease pain as has worked in past. Pt agreeable. No further pain complaint after start of mobility.   Therapeutic Activity: Bed Mobility: Patient performed supine --> sit to R side with Min A. VC/ tc required for initiation and min cues for technique. Transfers: Patient performed squat pivot transfer to r into w/c with minimal effort  and requires Max A with 2 efforts to reach w/c. Sit <> stands in parallel bars performed with CGA/ Min A. Provided verbal cues for upright posture, technique.  Neuromuscular Re-ed: NMR facilitated during  session with focus on standing balance, proprioception, midline orientation. Pt guided in finding midline with equal WB over BLE and equal activation of BLE while maintaining RLE knee extension. Lateral weight shifting progressing to LLE fwd/ bkwd stepping with block to R knee. Then reverse requiring assist to R for proper advancement. NMR performed for improvements in motor control and coordination, balance, sequencing, judgement, and self confidence/ efficacy in performing all aspects of mobility at highest level of independence.   Patient supine  in bed at end of session with brakes locked, bed alarm set, and all needs within reach.     Therapy Documentation Precautions:  Precautions Precautions: Fall Precaution Comments: R hemi, R inattention, decr R sensation, small subluxation of R shoulder, fluctuating agitation, hypertension, lightheadedness/ blurry vision/ nausea with change in position Restrictions Weight Bearing Restrictions: No General:    Therapy/Group: Individual Therapy  Loel Dubonnet PT, DPT 07/08/2021, 7:20 PM

## 2021-07-08 NOTE — Progress Notes (Addendum)
Speech Language Pathology Daily Session Note  Patient Details  Name: DELBERT DARLEY MRN: [id number] Date of Birth: [birth year]  Today's Date: 07/08/2021 SLP Individual Time: 0800-0900 SLP Individual Time Calculation (min): 60 min  Short Term Goals: Week 2: SLP Short Term Goal 1 (Week 2): Patient will orient x4 with mod A verbal cues for use of external aids SLP Short Term Goal 2 (Week 2): Patient will demonstrate sustained attention to functional tasks for 5 minute duration with mod A verbal and visual cues SLP Short Term Goal 3 (Week 2): Patient will complete functional sequencing tasks with max A verbal and visual cues SLP Short Term Goal 4 (Week 2): Patient will follow 1 step directions with mod A verbal cues SLP Short Term Goal 5 (Week 2): Patient will consume current diet with improved self-monitoring of pocketing and anterior labial spillage with mod A verbal and visual cues to implement compensatory strategies  Skilled Therapeutic Interventions: Pt greeted semi-reclined in bed and agreeable to skilled ST intervention with focus on swallowing and cognitive goals. Pt was very pleasant and cooperative this date and spoke very positively, "we're going to make it a beautiful day." SLP facilitated session by providing overall mod A verbal and visual cues for implementation of lingual sweep and self monitoring of pocketing and anterior labial spillage. SLP implemented use of mirror placed in front of patient for visual reinforcement in effort to aid self monitoring abilities. Pt looked at mirror and identified labial spillage to mid chin region with nectar thick liquids and required max assist to initiate clearance with washcloth. Pt able to clear ~50% of pocketing in right buccal cavity by performing lingual sweep due to reduced lingual lateral ROM and required oral sponge to clear other 50%. Pt consumed meal with appropriate rate and bolus volume. Known to take several bites at a time however this did  not occur consistently and did not appear to raise increased concern for pt safety/aspiration risk. SLP facilitated oral care with set-up A and sup A verbal cues for thoroughness. Patient was left in bed with HOB elevated, alarm activated and immediate needs within reach at end of session. Continue per current plan of care.      Pain Pain Assessment Pain Scale: 0-10 Pain Score: 0-No pain  Therapy/Group: Individual Therapy  Tamala Ser 07/08/2021, 8:26 AM

## 2021-07-09 NOTE — Progress Notes (Signed)
Speech Language Pathology Daily Session Note  Patient Details  Name: Carlos Welch MRN: [id number] Date of Birth: 12/28/1945  Today's Date: 07/09/2021 SLP Individual Time: 1220-1310 SLP Individual Time Calculation (min): 50 min  Short Term Goals: Week 2: SLP Short Term Goal 1 (Week 2): Patient will orient x4 with mod A verbal cues for use of external aids SLP Short Term Goal 2 (Week 2): Patient will demonstrate sustained attention to functional tasks for 5 minute duration with mod A verbal and visual cues SLP Short Term Goal 3 (Week 2): Patient will complete functional sequencing tasks with max A verbal and visual cues SLP Short Term Goal 4 (Week 2): Patient will follow 1 step directions with mod A verbal cues SLP Short Term Goal 5 (Week 2): Patient will consume current diet with improved self-monitoring of pocketing and anterior labial spillage with mod A verbal and visual cues to implement compensatory strategies  Skilled Therapeutic Interventions:Skilled ST services focused on swallow skills. Pt's daughter present for treatment session. SLP facilitated PO consumption of lunch tray dys 2 textures. SLP provided set up assist with mirror facing pt for biofeedback. Pt demonstrated basic problem solving self-feeding with min A verbal cues and sustained attention up to 10 minutes, at this time pt began closing eyes intermittently while eating resulting in moderate anterior spillage, but during periods of alertness only very mild anterior spillage was noted. Pt demonstrated very mild right buccal pocketing in which pt was able to clear with lingual sweeps given min A verbal cues. SLP provided x1 small sips of thin liquid via cup prior to quickly recalling pt's nectar thick liquid precautions, Pt demonstrated no overt s/s aspiration. SLP apologized to pt and family. SLP provided nectar thick liquid via cup, in which demonstrated timely swallow with no overt s/s aspiration. SLP reviewed most recent MBS on  06/24/21 in which very trace silent aspiration of thin liquids was noted on a small percentage of trials. SLP provided education for plans for repeat MBS early next week due to improved alertness, cognitive skills and swallow function. SLP notified nurse via secure chat Epic of incident. Pt was left in room with family, call bell within reach and bed alarm set. SLP recommends to continue skilled services.     Pain Pain Assessment Pain Score: 0-No pain  Therapy/Group: Individual Therapy  Carlos Welch  [facility] 07/09/2021, 2:26 PM

## 2021-07-09 NOTE — Progress Notes (Signed)
Slept well throughout the night. Turned and repositioned every 2 hours. Denies pain. Pt continent of urine.

## 2021-07-09 NOTE — Progress Notes (Signed)
Physical Therapy Session Note  Patient Details  Name: Carlos Welch MRN: [id number] Date of Birth: June 07, 1946  Today's Date: 07/09/2021 PT Individual Time: 0922-1022 PT Individual Time Calculation (min): 60 min   Short Term Goals: Week 1:  PT Short Term Goal 1 (Week 1): Pt will perform all bed mobility with consistent CGA. PT Short Term Goal 2 (Week 1): Pt will perform dynamic sitting balance tasks with Min A. PT Short Term Goal 3 (Week 1): Pt will perform sit <> stand with Mod A. PT Short Term Goal 4 (Week 1): Pt will initiate gait training with LRAD. PT Short Term Goal 5 (Week 1): Pt will decrease complaint of lightheadedness/ dizziness with mobility by 50%. Week 2:  PT Short Term Goal 1 (Week 2): Pt will perform all bed mobility with consistent CGA. PT Short Term Goal 2 (Week 2): Pt will perform sit <> stand with consistent Min A PT Short Term Goal 3 (Week 2): Pt will perform funcitonal seat to seat transfers with Min A. PT Short Term Goal 4 (Week 2): Pt will ambulate at least 50 feet with consistent Mod A. PT Short Term Goal 5 (Week 2): Pt will perform dynamic sitting balance tasks with CGA.   Skilled Therapeutic Interventions/Progress Updates:   Pt received supine in bed and agreeable to PT. Supine>sit transfer with min assist at trunk and cues for use of bed rail as needed through the LUE. Stand pivot to [facility] with RW and mod assist. Max cues for awareness of RLE and improved midline. Pt able to have small continent bowel movement. Sit<>stand x 2 to perform pericare and clothing management with min assist. Min assist overall to maintain balance with max cues for midline with instruction to "stand up straight". Stand pivot to [facility] with mod assist on the L with instruction for sequencing and awareness to RLE as listed above.   Gait training with RW x 16ft and mod assist overall from PT. Max cues for sequencing of gait pattern and facilitation to weight shift L and allow advancement of RLE.  No knee instability noted throughout gait training.   Seated NMR. BLE heel slides 2x 1 min. RUE shoulder flexion 2x6 and then LUE touching BLE in reciprocal pattern to force attention to the RLE and improve visual scanning.   Pt returned to room and performed stand pivot transfer to bed with RW and mod-max assist to the R with difficulty performing hip abduction on the RLE. Sit>supine completed with min assist at trunk, and left supine in bed with call bell in reach and all needs met.        Therapy Documentation Precautions:  Precautions Precautions: Fall Precaution Comments: R hemi, R inattention, decr R sensation, small subluxation of R shoulder, fluctuating agitation, hypertension, lightheadedness/ blurry vision/ nausea with change in position Restrictions Weight Bearing Restrictions: No  Pain: Pain Assessment Pain Scale: 0-10 Pain Score: 0-No pain    Therapy/Group: Individual Therapy  Lorie Phenix 07/09/2021, 10:25 AM

## 2021-07-11 ENCOUNTER — Ambulatory Visit: Payer: Medicare Other | Admitting: Medical

## 2021-07-11 LAB — CBC WITH DIFFERENTIAL/PLATELET
Abs Immature Granulocytes: 0.04 10*3/uL (ref 0.00–0.07)
Basophils Absolute: 0.1 10*3/uL (ref 0.0–0.1)
Basophils Relative: 1 %
Eosinophils Absolute: 0.1 10*3/uL (ref 0.0–0.5)
Eosinophils Relative: 2 %
HCT: 48.1 % (ref 39.0–52.0)
Hemoglobin: 16 g/dL (ref 13.0–17.0)
Immature Granulocytes: 1 %
Lymphocytes Relative: 14 %
Lymphs Abs: 0.9 10*3/uL (ref 0.7–4.0)
MCH: 28.8 pg (ref 26.0–34.0)
MCHC: 33.3 g/dL (ref 30.0–36.0)
MCV: 86.5 fL (ref 80.0–100.0)
Monocytes Absolute: 0.6 10*3/uL (ref 0.1–1.0)
Monocytes Relative: 10 %
Neutro Abs: 4.5 10*3/uL (ref 1.7–7.7)
Neutrophils Relative %: 72 %
Platelets: 195 10*3/uL (ref 150–400)
RBC: 5.56 MIL/uL (ref 4.22–5.81)
RDW: 17.4 % — ABNORMAL HIGH (ref 11.5–15.5)
WBC: 6.2 10*3/uL (ref 4.0–10.5)
nRBC: 0 % (ref 0.0–0.2)

## 2021-07-11 NOTE — Progress Notes (Signed)
PROGRESS NOTE   Subjective/Complaints:   Feels ok today , no pain c/o, states he can feel a little more on RIght side   ROS: Denies CP, SOB, N/V/D  Objective:   No results found. No results for input(s): WBC, HGB, HCT, PLT in the last 72 hours.  No results for input(s): NA, K, CL, CO2, GLUCOSE, BUN, CREATININE, CALCIUM in the last 72 hours.   Intake/Output Summary (Last 24 hours) at 07/11/2021 1610 Last data filed at 07/10/2021 1900 Gross per 24 hour  Intake 218 ml  Output --  Net 218 ml          Physical Exam: Vital Signs Blood pressure 138/90, pulse (!) 50, temperature 98 F (36.7 C), resp. rate 17, height 6\' 2"  (1.88 m), weight 75.5 kg, SpO2 99 %.  General: No acute distress Mood and affect are appropriate Heart: Regular rate and rhythm no rubs murmurs or extra sounds Lungs: Clear to auscultation, breathing unlabored, no rales or wheezes Abdomen: Positive bowel sounds, soft nontender to palpation, nondistended Extremities: No clubbing, cyanosis, or edema Skin: No evidence of breakdown, no evidence of rash  Neuro: Alert Motor: 5/5 in left deltoid, bicep, tricep, grip, hip flexor, knee extensors, ankle dorsiflexor and plantar flexor Sensation- absent to LT in RIght hand  RLE 3- hip knee ext synergy , 3- Right ankle PF  Assessment/Plan: 1. Functional deficits which require 3+ hours per day of interdisciplinary therapy in a comprehensive inpatient rehab setting. Physiatrist is providing close team supervision and 24 hour management of active medical problems listed below. Physiatrist and rehab team continue to assess barriers to discharge/monitor patient progress toward functional and medical goals  Care Tool:  Bathing    Body parts bathed by patient: Abdomen, Chest, Right arm, Face   Body parts bathed by helper: Left arm     Bathing assist Assist Level: Moderate Assistance - Patient 50 - 74%      Upper Body Dressing/Undressing Upper body dressing   What is the patient wearing?: Pull over shirt    Upper body assist Assist Level: Maximal Assistance - Patient 25 - 49%    Lower Body Dressing/Undressing Lower body dressing      What is the patient wearing?: Incontinence brief, Pants     Lower body assist Assist for lower body dressing: 2 Helpers     Toileting Toileting    Toileting assist Assist for toileting: 2 Helpers (stedy)     Transfers Chair/bed transfer  Transfers assist  Chair/bed transfer activity did not occur: Safety/medical concerns  Chair/bed transfer assist level: 2 Helpers     Locomotion Ambulation   Ambulation assist   Ambulation activity did not occur: Safety/medical concerns  Assist level: Moderate Assistance - Patient 50 - 74% Assistive device: Other (comment) (hallway handrail) Max distance: 38 ft   Walk 10 feet activity   Assist  Walk 10 feet activity did not occur: Safety/medical concerns  Assist level: Moderate Assistance - Patient - 50 - 74% Assistive device: Other (comment) (L hallway handrail)   Walk 50 feet activity   Assist Walk 50 feet with 2 turns activity did not occur: Safety/medical concerns  Walk 150 feet activity   Assist Walk 150 feet activity did not occur: Safety/medical concerns         Walk 10 feet on uneven surface  activity   Assist Walk 10 feet on uneven surfaces activity did not occur: Safety/medical concerns         Wheelchair     Assist Is the patient using a wheelchair?: Yes (Per PT long term goals) Type of Wheelchair: Manual Wheelchair activity did not occur: Safety/medical concerns         Wheelchair 50 feet with 2 turns activity    Assist    Wheelchair 50 feet with 2 turns activity did not occur: Safety/medical concerns       Wheelchair 150 feet activity     Assist  Wheelchair 150 feet activity did not occur: Safety/medical concerns        Blood pressure 138/90, pulse (!) 50, temperature 98 F (36.7 C), resp. rate 17, height 6\' 2"  (1.88 m), weight 75.5 kg, SpO2 99 %.  Medical Problem List and Plan: 1.  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  Antithrombotics: -DVT/anticoagulation:  Mechanical: Sequential compression devices, below knee Bilateral lower extremities             -antiplatelet therapy: N/A 3. Pain: continue Neurontin 100 mg nightly, hydrocodone as needed  Controlled on 9/26 4. Mood: Provide emotional support             -antipsychotic agents: N/A 5. Neuropsych: This patient is capable of making decisions on his own behalf. 6. Skin/Wound Care: Routine skin checks- added lac hydrin  7. Fluids/Electrolytes/Nutrition: Routine in and outs Continue megestrol  8.  Post stroke dysphagia.  Dysphagia #2 nectar thick liquids.  Follow-up speech therapy, poor intake due to lethargy now on appetite stimulant and neurostimulant   Advance diet as tolerated 9.  Hypertension.  Hydralazine 50 mg every 8 hours, Cozaar 100 mg daily.  Monitor with increased mobility Vitals:   07/10/21 1924 07/11/21 0531  BP: (!) 142/97 138/90  Pulse: (!) 58 (!) 50  Resp: 17 17  Temp: 98 F (36.7 C) 98 F (36.7 C)  SpO2: 99% 99%  Lasix, hydralazine, Cozaar decreased  Relatively controlled on 9/26 10.  Combined congestive heart failure.  Lasix decreased from 40mg  to 20mg   Filed Weights   07/05/21 0331 07/06/21 0456 07/09/21 0318  Weight: 77.2 kg 77 kg 75.5 kg   11.  Chronic bilateral lower extremity edema/cellulitis.  Continue Lasix.  Complete course of doxycycline 12.  Atrial fibrillation.  Eliquis held due to ICH.  Cardiac rate controlled 13.  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  Follow-up cardiology services 14.  Hyperlipidemia.  Currently holding statin due to ICH 15.  Chronic  thrombocytopenia.    Platelets 142 on 9/16, await repeat 9/26 16.  Lethargy: Multifactorial  Repeat CT showed no new bleed or infarct, CBC normal and afebrile to doubt infection, Thalamic strokes often cause problems with arousal Methylphenidate initiated improved initiation and participation  Increase methylphenidate to 10mg  BID 17.  Urinary retention, was on I/O caths but changed to foley after hematuria, urine now clear will do another voiding trial , resume flomax   LOS: 14 days A FACE TO FACE EVALUATION WAS PERFORMED  07/11/21 07/11/2021, 8:22 AM

## 2021-07-11 NOTE — Progress Notes (Signed)
Occupational Therapy Session Note  Patient Details  Name: Carlos Welch MRN: [id number] Date of Birth: [birth year]  Today's Date: 07/11/2021 OT Individual Time: 5102-5852 OT Individual Time Calculation (min): 60 min    Short Term Goals: Week 2:  OT Short Term Goal 1 (Week 2): Pt will tolerate completing ADL routine 2/3 sessions with no more than min VCs to redirect agitation. OT Short Term Goal 2 (Week 2): Pt will don pants with max A.+ LRAD. OT Short Term Goal 3 (Week 2): Pt will don shirt with mod A. OT Short Term Goal 4 (Week 2): Pt will complete toilet transfer with max A + LRAD.  Skilled Therapeutic Interventions/Progress Updates:  Pt received asleep in bed but easily able to arouse and hesitantly agreeable to OT intervention. BP checked from supine 143/98 ( 113). Pt completed bed mobiltiy with MIN A needing assist to elevate trunk into sitting. Pt required MAX A for LB dressing to don pants from EOB. Pt reports "I can't do it, I can't do it" but pt was able to pull pants up above knees from sitting EOB and thread LLE from EOB once RLE was threaded. Pt completed lateral scoot transfer from EOB>w/c to pts L side with MIN A +2 for safety. Pt declined anymore ADL participation but was agreeable to going to gym. Pt transported to gym with total A for time mgmt. Pt completed stand pivot transfer from w/c >EOM with MOD and +2 for safety. Worked on sit<>stands from EOM with Rw with pt needing MIN A +2 to stand, pt required assist to manually keep RLE positioned underneath hips and positon RUE on RW. Utilized mirror to provide visual feedback with pt reporting "I have to look at the floor" when cueing pt to look up at mirror. Pt with L lateral lean in standing needing manual assist to shift weight to R side. Pt then reports urgency to void bladder, pt transported back to room with total A d/t urgency. Pt required MAX encouragement to agree to practice transfer from w/c>toilet. Pt completed stand pivot  transfer with MIN A +2 utilizing grab bar with LUE, pt needing MAX A for 3/3 toileting tasks. Pt completed stand pivot transfer back to w/c with MIN A +2. Pt declined going back to gym, but agreeable to work on standing from EOB. MIN A +2 for stand pivot transfer from w/c>EOB with no AD. Pt completed additionally sit<>stand from EOB with MIN A +2, pt able to take lateral [facility] with MOD A +2. BP rechecked from supine 158/100 . Waited ~ 2 mins and rechecked with BP down to 147/102; alerted RN. Pt left supine in bed with bed alarm activated and all needs within reach.  Therapy Documentation Precautions:  Precautions Precautions: Fall Precaution Comments: R hemi, R inattention, decr R sensation, small subluxation of R shoulder, fluctuating agitation, hypertension, lightheadedness/ blurry vision/ nausea with change in position Restrictions Weight Bearing Restrictions: No  Pain: pt reports 2-3/10 pain in BLEs, offered rest breaks and relaxation techniques as pain mgmt strategies.     Therapy/Group: Individual Therapy  Barron Schmid 07/11/2021, 12:12 PM

## 2021-07-11 NOTE — Progress Notes (Signed)
Physical Therapy Session Note  Patient Details  Name: Carlos Welch MRN: [id number] Date of Birth: 1946/09/23  Today's Date: 07/11/2021 PT Individual Time: 9562-1308 and 6578-4696 PT Individual Time Calculation (min): 40 min and 57 min  Short Term Goals: Week 1:  PT Short Term Goal 1 (Week 1): Pt will perform all bed mobility with consistent CGA. PT Short Term Goal 2 (Week 1): Pt will perform dynamic sitting balance tasks with Min A. PT Short Term Goal 3 (Week 1): Pt will perform sit <> stand with Mod A. PT Short Term Goal 4 (Week 1): Pt will initiate gait training with LRAD. PT Short Term Goal 5 (Week 1): Pt will decrease complaint of lightheadedness/ dizziness with mobility by 50%. Week 2:  PT Short Term Goal 1 (Week 2): Pt will perform all bed mobility with consistent CGA. PT Short Term Goal 2 (Week 2): Pt will perform sit <> stand with consistent Min A PT Short Term Goal 3 (Week 2): Pt will perform funcitonal seat to seat transfers with Min A. PT Short Term Goal 4 (Week 2): Pt will ambulate at least 50 feet with consistent Mod A. PT Short Term Goal 5 (Week 2): Pt will perform dynamic sitting balance tasks with CGA.   Skilled Therapeutic Interventions/Progress Updates:  Session 1: Patient supine in bed on entrance to room. Patient alert and agreeable to PT session. Has just completed OT session with slightly elevated BP. BP checked in supine prior to OOB therapy. BP obtained at 128/89 with pulse rate at 60 bpm.   Patient with no pain complaint throughout session.  Therapeutic Activity: Bed Mobility: Patient performed supine --> sit with ability to bring BLE to EOB and requiring light Min A to reach upright seated position on EOB. VC/ tc required for technique, proper hand holds and potential push from bed surface with BUE. At end of session, pt requires Min/ Mod A +2 to return to supine as he is unable to bring BLE to bed surface. Unable to follow instructions to assist with  positioning toward HOB.  Transfers: Patient performed squat pivot transfer from bed to w/c toward L side and initially attempting with no effort from pt. Over body positioning from therapist in order to promote trunk flexion for more adequate forward lean and requires  Mod A +2 to reach w/c. Sit<>stand transfers from w/c to RW during session with Min A but requiring extensive setup with BLE positioning, hand positioning, and continuous vc for forward lean for each attempt. Pt is able to provide assist in power up with push from LUE from armrest of w/c. Stand pivot w/c -->bed at end of session with Mod A for RLE Advancement.  Gait Training:  Patient ambulated 20' x1/ 10' x1 using RW with Mod/ Max A for balance and intermittent RLE advancement. +2 for w/c follow. Demonstrated intermittent good/ poor motor planning and control of RLE in execution of forward stepping. Brief periods of reciprocal stepping with pt able to advance RLE with physical assist in appropriate weight shift over LLE. Then pt demos inability to flex knee with trunkal extension paired with full hypertonic RLE extension. Improved only with relaxation of UB, then RLE to allow for knee flexion. Provided vc/ tc throughout for weight shifting, advancement of LE.  Neuromuscular Re-ed: NMR facilitated during session with focus on standing balance, midline orientation, one sided lean, motor control. Pt guided in sit <> stand and reaching standing position at RW. Focus on reaching standing balance with equal weight  bearing and reaching midline. Fwd/ bkwd step initiated with RLE for pregait training. NMR performed for improvements in motor control and coordination, balance, sequencing, judgement, and self confidence/ efficacy in performing all aspects of mobility at highest level of independence.   Patient supine  in bed at end of session with brakes locked, bed alarm set, and all needs within reach.  Session 2: Patient supine in bed and asleep on  entrance to room. Patient easily roused and requires time to fully wake before becoming alert and agreeable to PT session.   Patient with no pain complaint throughout session.  Therapeutic Activity: Bed Mobility: Patient performed supine --> sit with ability to bring BLE to EOB and requiring light Min A to reach upright seated position on EOB. VC/ tc required for technique, proper hand holds and potential push from bed surface with BUE. Sit-->supine performed at end of session with Min A for BLE and UB positioning. Transfers: Patient performed sit<>stand transfers throughout session with Min A and push-to-stand technique. Stand pivot and squat pivot transfers require Mod A +2 or Max A +1 to complete. Provided verbal cues for technique, sequencing, hand/ foot placement.  Neuromuscular Re-ed: NMR facilitated during session with focus on seated balance, standing blance, motor control and planning. Pt guided in standing and reaching task grasping beanbags from differing heights and distances from therapist's hands to match to colored disc on table in front of pt. Correct in matching colors, Pt demonstrating difficulty with maintaining balance with minimal hip hinge demonstrated. Retrieval of called color and passed bean bags to therapy tech with pt choosing correct color 50% of time and reaching just short of most targets.   Pt also guided in seated then standing toe touches to 6" step with R toe then heel. Improved performance with each position with practice. Requires Mod A to maintain balance in standing. NMR performed for improvements in motor control and coordination, balance, sequencing, judgement, and self confidence/ efficacy in performing all aspects of mobility at highest level of independence.   Patient supine  in bed at end of session with brakes locked, bed alarm set, and all needs within reach.    Therapy Documentation Precautions:  Precautions Precautions: Fall Precaution Comments: R  hemi, R inattention, decr R sensation, small subluxation of R shoulder, fluctuating agitation, hypertension, lightheadedness/ blurry vision/ nausea with change in position Restrictions Weight Bearing Restrictions: No  Pain: No pain complaint throughout either session.   Therapy/Group: Individual Therapy  Loel Dubonnet PT, DPT 07/11/2021, 12:56 PM

## 2021-07-11 NOTE — Progress Notes (Signed)
Speech Language Pathology Daily Session Note  Patient Details  Name: Carlos Welch MRN: [id number] Date of Birth: 06/06/1946  Today's Date: 07/11/2021 SLP Individual Time: 5825-1898 SLP Individual Time Calculation (min): 45 min  Short Term Goals: Week 2: SLP Short Term Goal 1 (Week 2): Patient will orient x4 with mod A verbal cues for use of external aids SLP Short Term Goal 2 (Week 2): Patient will demonstrate sustained attention to functional tasks for 5 minute duration with mod A verbal and visual cues SLP Short Term Goal 3 (Week 2): Patient will complete functional sequencing tasks with max A verbal and visual cues SLP Short Term Goal 4 (Week 2): Patient will follow 1 step directions with mod A verbal cues SLP Short Term Goal 5 (Week 2): Patient will consume current diet with improved self-monitoring of pocketing and anterior labial spillage with mod A verbal and visual cues to implement compensatory strategies  Skilled Therapeutic Interventions: Pt greeted semi-reclined in bed following morning meal and agreeable to skilled ST intervention with focus on cognitive goals. Pt was oriented to person and place only this date. Required min A verbal cues to use external aids to orient to month, day of week, year, situation. He required mod A verbal cues to retain this information during session. SLP facilitated oral care at bed level with min A for thoroughness. Pt known to lightly tap teeth with toothbrush in effort to not disturb denture placement. Pt max A for denture care due to restrictions from completing task at bed level. SLP re-applied denture adhesive. SLP also facilitated session by providing min A verbal and visual cues progressing to supervision level for problem solving, reasoning, and recall with novel card task Uno. Finally, SLP facilitated verbal reasoning skills (compare and contrasting common items and concepts) with min A verbal cues. Pt with occasional word finding hesitation.  Patient was left in bed with alarm activated and immediate needs within reach at end of session. Continue per current plan of care.      Pain Pain Assessment Pain Scale: 0-10 Pain Score: 0-No pain  Therapy/Group: Individual Therapy  Tamala Ser 07/11/2021, 8:43 AM

## 2021-07-12 MED ORDER — GERHARDT'S BUTT CREAM
TOPICAL_CREAM | Freq: Two times a day (BID) | CUTANEOUS | Status: DC
  Filled 2021-07-12: qty 1

## 2021-07-12 NOTE — Progress Notes (Signed)
Physical Therapy Weekly Progress Note  Patient Details  Name: Carlos Welch MRN: [id number] Date of Birth: 1946-07-10  Beginning of progress report period: July 06, 2021 End of progress report period: July 12, 2021  Today's Date: 07/12/2021 PT Individual Time: 4782-9562 PT Individual Time Calculation (min): 15 min   Patient has met 1 of 5 short term goals.  He has partly met 1 STG and is progressing toward 2 others. Pt not seen for full session this day but progress report based on recent visits with pt as well as pt notes. Pt continues to progress slowly with variable levels of participation and variable temperament from day to day. He is able to complete bed mobility with CGA and cueing to complete. Sit <> stand transfers can be completed with Min A, but all seat to seat transfers require at least Mod A with extensive cueing to complete. Gait requires Mod A to reach up to 20 feet with assist for LE advancement, placement, and weight shift. He will not progress to stair training as the current plan is to discharge to SNF for long term care. He continues to be limited by low activity tolerance, non-specific pain, limited dizziness, continued blurry vision, and dystonia as well as generalized and BLE weakness.    Patient continues to demonstrate the following deficits muscle weakness, decreased cardiorespiratoy endurance, impaired timing and sequencing, abnormal tone, unbalanced muscle activation, and decreased coordination, decreased visual acuity, decreased midline orientation, decreased attention to right, and decreased motor planning, decreased initiation, decreased attention, decreased awareness, decreased safety awareness, and decreased memory, and decreased sitting balance, decreased standing balance, decreased postural control, decreased balance strategies, and hemipareisis  and therefore will continue to benefit from skilled PT intervention to increase functional independence with  mobility.  Patient progressing toward long term goals..  Continue plan of care.  PT Short Term Goals Week 2:  PT Short Term Goal 1 (Week 2): Pt will perform all bed mobility with consistent CGA. PT Short Term Goal 1 - Progress (Week 2): Progressing toward goal PT Short Term Goal 2 (Week 2): Pt will perform sit <> stand with consistent Min A PT Short Term Goal 2 - Progress (Week 2): Partly met PT Short Term Goal 3 (Week 2): Pt will perform funcitonal seat to seat transfers with Min A. PT Short Term Goal 3 - Progress (Week 2): Not met PT Short Term Goal 4 (Week 2): Pt will ambulate at least 50 feet with consistent Mod A. PT Short Term Goal 4 - Progress (Week 2): Progressing toward goal PT Short Term Goal 5 (Week 2): Pt will perform dynamic sitting balance tasks with CGA. PT Short Term Goal 5 - Progress (Week 2): Met Week 3:  PT Short Term Goal 1 (Week 3): STG = LTG d/t ELOS  Skilled Therapeutic Interventions/Progress Updates:  Patient supine in bed on entrance to room. Patient initally asleep and requires minimal time to wake. Pt not agreeable to therapy session andrefusing to leave bed. On suggestion of continuing mobility as pt tends to feel better after initiating mobility with pt continuing to refuse therapy this day d/t not feeling "right". BP taken in supine with reading of 125/ 89. Pt continues to c/o blurry vision.    Improved  pt's positioning in bed with mod A +2 and minimal push with BLE from pt. Repositioned R arm on pillow for improved shoulder position in scapular plane. Patient supine  in bed at end of session with brakes locked, bed alarm set,  and all needs within reach.    Therapy Documentation Precautions:  Precautions Precautions: Fall Precaution Comments: R hemi, R inattention, decr R sensation, small subluxation of R shoulder, fluctuating agitation, hypertension, lightheadedness/ blurry vision/ nausea with change in position Restrictions Weight Bearing Restrictions:  No  General: PT Amount of Missed Time (min): 45 Minutes PT Missed Treatment Reason: Patient fatigue;Patient unwilling to participate  Vital Signs: BP: 125/ 89 Pulse Rate: variable from 45 -63 Patient Position (if appropriate): Lying  Pain:  No pain complaint this session, but pt unwilling to participate in therapy session d/t feeling unwell.  Therapy/Group: Individual Therapy  Alger Simons PT, DPT 07/12/2021, 7:47 AM

## 2021-07-12 NOTE — Progress Notes (Signed)
Speech Language Pathology Weekly Progress and Session Note  Patient Details  Name: Carlos Welch MRN: [id number] Date of Birth: 10-16-1946  Beginning of progress report period: July 05, 2021 End of progress report period: July 12, 2021  Today's Date: 07/12/2021 SLP Individual Time: 4332-9518 SLP Individual Time Calculation (min): 35 min and Today's Date: 07/12/2021 SLP Missed Time: 10 Minutes Missed Time Reason: Patient unwilling to participate;Other (Comment) (complaints of stomach discomfort. RN made aware)  Short Term Goals: Week 2: SLP Short Term Goal 1 (Week 2): Patient will orient x4 with mod A verbal cues for use of external aids SLP Short Term Goal 1 - Progress (Week 2): Met SLP Short Term Goal 2 (Week 2): Patient will demonstrate sustained attention to functional tasks for 5 minute duration with mod A verbal and visual cues SLP Short Term Goal 2 - Progress (Week 2): Met SLP Short Term Goal 3 (Week 2): Patient will complete functional sequencing tasks with max A verbal and visual cues SLP Short Term Goal 3 - Progress (Week 2): Met SLP Short Term Goal 4 (Week 2): Patient will follow 1 step directions with mod A verbal cues SLP Short Term Goal 4 - Progress (Week 2): Met SLP Short Term Goal 5 (Week 2): Patient will consume current diet with improved self-monitoring of pocketing and anterior labial spillage with mod A verbal and visual cues to implement compensatory strategies SLP Short Term Goal 5 - Progress (Week 2): Met  New Short Term Goals: Week 3: SLP Short Term Goal 1 (Week 3): STG=LTG due to ELOS  Weekly Progress Updates: Patient has made steady progress with ST over the past week and has met 5 of 5 short term goals this reporting period due to improved participation/cooperation and cognition-communication skills characterized by improvement in orientation, sustained attention, intellectual awareness, and problem solving skills. Sustained attention and participation  continues to fluctuate based on mood however this appears to be steadily progressing resulting in pt appearing more receptive to therapists and staff. When in good spirits, pt more effectively able to communicate his needs, though limited at times due to continued hesitation and word finding difficulty. Currently sup-to-min A for comprehension and expression. Patient is currently completing cognitive-linguistic tasks with min A verbal and visual cues. He is currently consuming Dys 2 textures and nectar thick liquids with excellent improvement self monitoring skills for pocketing, anterior spillage, and rate of consumption which consequently improve pt's swallow safety and efficiency. Pt is scheduled to obtain a repeat modified barium swallow study on 07/13/21. Patient/family education is ongoing. Recommend continued skilled ST intervention to maximize  function and functional independence prior to discharge.   Intensity: Minumum of 1-2 x/day, 30 to 90 minutes Frequency: 3 to 5 out of 7 days Duration/Length of Stay: SNF pending Treatment/Interventions: Cognitive remediation/compensation;Environmental controls;Internal/external aids;Other (comment);Functional tasks;Dysphagia/aspiration precaution training;Cueing hierarchy;Patient/family education   Daily Session Skilled Therapeutic Interventions: Pt greeted semi-reclined in bed with nurse at bedside administering medication. Pt agreeable to skilled ST intervention with focus on swallowing and cognitive goals however participation limited by complaints of generalized discomfort and most notable location being stomach. SLP facilitated low stim environment by reducing lights and closing door to minimize background noise and distractions. Pt consumed meds crushed and some whole in applesauce with timely AP transit and no overt s/sx of aspiration. Pt required min A verbal cues to monitor sip size with nectar thick liquids to reduce incidence of anterior labial  spillage on right side. Discussed plans for modified barium swallow  study tomorrow. Pt verbalized understanding and agreement to participate in the study. SLP also facilitated session by providing min A verbal cues for verbal sequencing with ADLs (e.g., denture care, bathing). Pt completed task with logical and accurate organization and benefited from field of choices on occasion to arrange in appropriate sequence. Pt known to exhibit decreased frustration tolerance at end of session and was not willing to participate further in treatment due to wishes to rest. Pt missed 10 minutes of skilled SLP treatment. Patient was left in bed with alarm activated and immediate needs within reach at end of session. Continue per current plan of care.      General    Pain Pain Assessment Pain Scale: 0-10 Pain Score: 4  Pain Location: Abdomen Pain Descriptors / Indicators: Discomfort Pain Intervention(s): RN made aware;Medication (See eMAR);Other (Comment) (facilitated low stimulation environment)  Therapy/Group: Individual Therapy  Patty Sermons 07/12/2021, 8:46 AM

## 2021-07-12 NOTE — Progress Notes (Signed)
Occupational Therapy Session Note  Patient Details  Name: Carlos Welch MRN: [id number] Date of Birth: 06-Feb-1946  Today's Date: 07/12/2021 OT Individual Time: 1415-1500 OT Individual Time Calculation (min): 45 min    Short Term Goals: Week 2:  OT Short Term Goal 1 (Week 2): Pt will tolerate completing ADL routine 2/3 sessions with no more than min VCs to redirect agitation. OT Short Term Goal 2 (Week 2): Pt will don pants with max A.+ LRAD. OT Short Term Goal 3 (Week 2): Pt will don shirt with mod A. OT Short Term Goal 4 (Week 2): Pt will complete toilet transfer with max A + LRAD.  Skilled Therapeutic Interventions/Progress Updates:    Patient in bed, awake and notes mild pain "all over"  he agrees to engage in light activity and states that he feels a little better with moving, fatigues quickly though.  Lower body bathing and dressing completed at bed level - he is able to reach both feet to assist with removing slipper socks but over all mod /max A for lower body bathing and dressing, min A to roll right and left.  Side lying to sitting edge of bed with mod/max A.   He requested to change position due to discomfort with positioning of brief - stood at edge of bed with min/mod A of 2.  He maintained unsupported sitting for 10 minutes with focus on upper body bathing and dressing - bathing with mod A and cues, OH shirt max A - poor awareness of right arm and positioning of shirt.  Returned to supine due to fatigue and to rest prior to next session.  He requires mod A to scoot laterally in bed, max A sit to supine.   Bed alarm set, call bell in hand.  Hand off to next therapy at exit.    Therapy Documentation Precautions:  Precautions Precautions: Fall Precaution Comments: R hemi, R inattention, decr R sensation, small subluxation of R shoulder, fluctuating agitation, hypertension, lightheadedness/ blurry vision/ nausea with change in position Restrictions Weight Bearing Restrictions:  No   Therapy/Group: Individual Therapy  Barrie Lyme 07/12/2021, 7:44 AM

## 2021-07-12 NOTE — Plan of Care (Signed)
  Problem: RH Furniture Transfers Goal: LTG Patient will perform furniture transfers w/assist (OT/PT) Description: LTG: Patient will perform furniture transfers  with assistance (OT/PT). Outcome: Not Applicable Note: Family requesting discharge to SNF and goal no longer appropriate.

## 2021-07-12 NOTE — Progress Notes (Signed)
Occupational Therapy Session Note  Patient Details  Name: TAGEN MILBY MRN: [id number] Date of Birth: 1946-01-22  Today's Date: 07/12/2021 OT Individual Time: 2500-3704 OT Individual Time Calculation (min): 23 min    Short Term Goals: Week 1:  OT Short Term Goal 1 (Week 1): Pt will don shirt with mod A. OT Short Term Goal 1 - Progress (Week 1): Not met OT Short Term Goal 2 (Week 1): Pt will don pants with max A.+ LRAD. OT Short Term Goal 2 - Progress (Week 1): Not met OT Short Term Goal 3 (Week 1): Pt will tolerate completing ADL routine 2/3 sessions with no more than min VCs to redirect agitation. OT Short Term Goal 3 - Progress (Week 1): Not met OT Short Term Goal 4 (Week 1): Pt will maintain static sitting with CGA and no posterior LOB for >5 min in prep for seated ADL. OT Short Term Goal 4 - Progress (Week 1): Met Week 2:  OT Short Term Goal 1 (Week 2): Pt will tolerate completing ADL routine 2/3 sessions with no more than min VCs to redirect agitation. OT Short Term Goal 2 (Week 2): Pt will don pants with max A.+ LRAD. OT Short Term Goal 3 (Week 2): Pt will don shirt with mod A. OT Short Term Goal 4 (Week 2): Pt will complete toilet transfer with max A + LRAD.   Skilled Therapeutic Interventions/Progress Updates:    Pt greeted at time of session just finishing up with previous OT, ADL needs met. Focus of session on bed mobility with Mod A overall for supine <> sit with cues for hand placement. Sitting EOB with truncal sway noted, oral hygiene with max cues for sequencing toothepaste on brush and hand over hand for thoroughness, attempting to have pt try to complete with R hand but ultimately declining. Suction performed to remove liquid/residue. Returned to supine and reviewed brief AAROM for RUE at shoulder and elbow, pt stating "I have trouble moving it unless I can look at it" eventually needing Mod A for ROM in RUE. Reclined in bed alarm on call bell in reach.   Therapy  Documentation Precautions:  Precautions Precautions: Fall Precaution Comments: R hemi, R inattention, decr R sensation, small subluxation of R shoulder, fluctuating agitation, hypertension, lightheadedness/ blurry vision/ nausea with change in position Restrictions Weight Bearing Restrictions: No    Therapy/Group: Individual Therapy  Viona Gilmore 07/12/2021, 7:26 AM

## 2021-07-12 NOTE — Progress Notes (Signed)
PROGRESS NOTE   Subjective/Complaints: Eating with supervision, spills a lot of food on his chest , no cough with meals   ROS: Denies CP, SOB, N/V/D  Objective:   No results found. Recent Labs    07/11/21 1322  WBC 6.2  HGB 16.0  HCT 48.1  PLT 195    No results for input(s): NA, K, CL, CO2, GLUCOSE, BUN, CREATININE, CALCIUM in the last 72 hours.   Intake/Output Summary (Last 24 hours) at 07/12/2021 0747 Last data filed at 07/11/2021 1757 Gross per 24 hour  Intake 480 ml  Output --  Net 480 ml          Physical Exam: Vital Signs Blood pressure (!) 137/95, pulse (!) 53, temperature 98.2 F (36.8 C), resp. rate 18, height 6\' 2"  (1.88 m), weight 75.5 kg, SpO2 100 %.  General: No acute distress Mood and affect are appropriate Heart: Regular rate and rhythm no rubs murmurs or extra sounds Lungs: Clear to auscultation, breathing unlabored, no rales or wheezes Abdomen: Positive bowel sounds, soft nontender to palpation, nondistended Extremities: No clubbing, cyanosis, or edema Skin: No evidence of breakdown, no evidence of rash  Neuro: Alert Motor: 5/5 in left deltoid, bicep, tricep, grip, hip flexor, knee extensors, ankle dorsiflexor and plantar flexor Sensation- absent to LT in RIght hand  RLE 3- hip knee ext synergy , 3- Right ankle PF  Assessment/Plan: 1. Functional deficits which require 3+ hours per day of interdisciplinary therapy in a comprehensive inpatient rehab setting. Physiatrist is providing close team supervision and 24 hour management of active medical problems listed below. Physiatrist and rehab team continue to assess barriers to discharge/monitor patient progress toward functional and medical goals  Care Tool:  Bathing    Body parts bathed by patient: Abdomen, Chest, Right arm, Face   Body parts bathed by helper: Left arm     Bathing assist Assist Level: Moderate Assistance - Patient 50  - 74%     Upper Body Dressing/Undressing Upper body dressing   What is the patient wearing?: Pull over shirt    Upper body assist Assist Level: Maximal Assistance - Patient 25 - 49%    Lower Body Dressing/Undressing Lower body dressing      What is the patient wearing?: Incontinence brief, Pants     Lower body assist Assist for lower body dressing: Maximal Assistance - Patient 25 - 49%     Toileting Toileting    Toileting assist Assist for toileting: 2 Helpers     Transfers Chair/bed transfer  Transfers assist  Chair/bed transfer activity did not occur: Safety/medical concerns  Chair/bed transfer assist level: 2 Helpers     Locomotion Ambulation   Ambulation assist   Ambulation activity did not occur: Safety/medical concerns  Assist level: Moderate Assistance - Patient 50 - 74% Assistive device: Other (comment) (hallway handrail) Max distance: 38 ft   Walk 10 feet activity   Assist  Walk 10 feet activity did not occur: Safety/medical concerns  Assist level: Moderate Assistance - Patient - 50 - 74% Assistive device: Other (comment) (L hallway handrail)   Walk 50 feet activity   Assist Walk 50 feet with 2 turns activity did  not occur: Safety/medical concerns         Walk 150 feet activity   Assist Walk 150 feet activity did not occur: Safety/medical concerns         Walk 10 feet on uneven surface  activity   Assist Walk 10 feet on uneven surfaces activity did not occur: Safety/medical concerns         Wheelchair     Assist Is the patient using a wheelchair?: Yes (Per PT long term goals) Type of Wheelchair: Manual Wheelchair activity did not occur: Safety/medical concerns         Wheelchair 50 feet with 2 turns activity    Assist    Wheelchair 50 feet with 2 turns activity did not occur: Safety/medical concerns       Wheelchair 150 feet activity     Assist  Wheelchair 150 feet activity did not occur:  Safety/medical concerns       Blood pressure (!) 137/95, pulse (!) 53, temperature 98.2 F (36.8 C), resp. rate 18, height 6\' 2"  (1.88 m), weight 75.5 kg, SpO2 100 %.  Medical Problem List and Plan: 1.  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  Antithrombotics: -DVT/anticoagulation:  Mechanical: Sequential compression devices, below knee Bilateral lower extremities             -antiplatelet therapy: N/A 3. Pain: continue Neurontin 100 mg nightly, hydrocodone as needed  Controlled on 9/27 4. Mood: Provide emotional support             -antipsychotic agents: N/A 5. Neuropsych: This patient is capable of making decisions on his own behalf. 6. Skin/Wound Care: Routine skin checks- added lac hydrin  7. Fluids/Electrolytes/Nutrition: Routine in and outs Continue megestrol  8.  Post stroke dysphagia.  Dysphagia #2 nectar thick liquids.  Follow-up speech therapy, poor intake due to lethargy now on appetite stimulant and neurostimulant   Advance diet as tolerated 9.  Hypertension.  Hydralazine 50 mg every 8 hours, Cozaar 100 mg daily.  Monitor with increased mobility Vitals:   07/11/21 1936 07/12/21 0559  BP: 132/86 (!) 137/95  Pulse: 62 (!) 53  Resp: 18 18  Temp: 98.8 F (37.1 C) 98.2 F (36.8 C)  SpO2: 98% 100%  Lasix, hydralazine, Cozaar decreased  Relatively controlled on 9/27 10.  Combined congestive heart failure.  Lasix decreased from 40mg  to 20mg   Filed Weights   07/05/21 0331 07/06/21 0456 07/09/21 0318  Weight: 77.2 kg 77 kg 75.5 kg   11.  Chronic bilateral lower extremity edema/cellulitis.  Continue Lasix.  Complete course of doxycycline 12.  Atrial fibrillation.  Eliquis held due to ICH.  Cardiac rate controlled 13.  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  Follow-up cardiology services 14.  Hyperlipidemia.  Currently holding  statin due to ICH 15.  Chronic thrombocytopenia.    Platelets 142 on 9/16, await repeat 9/26 16.  Lethargy: Multifactorial  Repeat CT showed no new bleed or infarct, CBC normal and afebrile to doubt infection, Thalamic strokes often cause problems with arousal Methylphenidate initiated improved initiation and participation  Increase methylphenidate to 10mg  BID 17.  Urinary retention, was on I/O caths but changed to foley after hematuria, urine now clear will do another voiding trial , resume flomax   LOS: 15 days A FACE TO FACE EVALUATION WAS PERFORMED  07/11/21 07/12/2021, 7:47 AM

## 2021-07-12 NOTE — Progress Notes (Signed)
Patient ID: Carlos Welch, male   DOB: 04/26/1946, 75 y.o.   MRN: [id number]  This SW covering for primary SW, Lavera Guise.   SNF referral sent out. NCPASRR# pending.   Cecile Sheerer, MSW, LCSWA Office: [phone] Cell: [phone] Fax: [phone]

## 2021-07-12 NOTE — NC FL2 (Signed)
[place] MEDICAID FL2 LEVEL OF CARE SCREENING TOOL     IDENTIFICATION  Patient Name: Carlos Welch Birthdate: 1946-07-11 Sex: male Admission Date (Current Location): 06/27/2021  [facility] and [place] Number:  Producer, television/film/video and Address:  [facility], [address] [place], [place] [address]      Provider Number: [id number]  Attending Physician Name and Address:  Erick Colace, MD  Relative Name and Phone Number:  Horatio Pel #[phone]    Current Level of Care: Hospital Recommended Level of Care: Nursing Facility Prior Approval Number:    Date Approved/Denied:   PASRR Number:    Discharge Plan: SNF    Current Diagnoses: Patient Active Problem List   Diagnosis Date Noted   Chronic combined systolic and diastolic congestive heart failure (HCC)    Dysphagia, post-stroke    ICH (intracerebral hemorrhage) (HCC) 06/27/2021   Intracerebral hemorrhage (HCC) 06/23/2021   Acute exacerbation of CHF (congestive heart failure) (HCC) 08/08/2020   Abscess of jaw 07/19/2020   Admission for palliative care 07/19/2020   Aortic valve stenosis 07/19/2020   Bacteremia 07/19/2020   Cognitive communication deficit 07/19/2020   Coronary artery disease involving coronary bypass graft of native heart without angina pectoris 07/19/2020   Debility 07/19/2020   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   Nonrheumatic mitral valve regurgitation 07/19/2020   Severe malnutrition (HCC) 07/19/2020   TIA (transient ischemic attack) 07/19/2020   Weight loss 07/19/2020   Acute on chronic heart failure with preserved ejection fraction (HCC) 07/08/2020   BPH (benign prostatic hyperplasia) 07/08/2020   Cellulitis of lower extremity 07/08/2020   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/08/2020   Elevated troponin 07/08/2020   Essential hypertension 07/08/2020   Hyperlipemia 07/08/2020   Normocytic anemia 07/08/2020    Thrombocytopenia (HCC) 07/08/2020   Chronic atrial fibrillation (HCC) 07/08/2020   Pressure injury of skin 05/12/2020   Generalized abdominal pain     Orientation RESPIRATION BLADDER Height & Weight     Self, Time, Situation, Place  Normal Incontinent (I/O cathing 2-3 xs per day due to retention.) Weight: 166 lb 7.2 oz (75.5 kg) Height:  6\' 2"  (188 cm)  BEHAVIORAL SYMPTOMS/MOOD NEUROLOGICAL BOWEL NUTRITION STATUS      Incontinent Diet (D2 nectar thick)  AMBULATORY STATUS COMMUNICATION OF NEEDS Skin   Limited Assist Verbally Normal, Other (Comment) (MASD to buttocks with barrier cream; Scrotum microguard powder)                       Personal Care Assistance Level of Assistance  Bathing, Dressing (set up for meals) Bathing Assistance: Limited assistance   Dressing Assistance: Limited assistance     Functional Limitations Info  Sight, Speech, Hearing Sight Info: Adequate (keeps eyes closed but can see but c/o of double vision; wears glasses) Hearing Info: Adequate Speech Info: Adequate    SPECIAL CARE FACTORS FREQUENCY  PT (By licensed PT), OT (By licensed OT), Speech therapy     PT Frequency: 5xs per week OT Frequency: 5xs per week     Speech Therapy Frequency: 5xs per week      Contractures Contractures Info: Not present    Additional Factors Info  Code Status, Allergies Code Status Info: Full Allergies Info: Chocolate Medium- Rash; Peanut-rash           Current Medications (07/12/2021):  This is the current hospital active medication list Current Facility-Administered Medications  Medication Dose Route  Frequency Provider Last Rate Last Admin   acetaminophen (TYLENOL) tablet 650 mg  650 mg Oral Q4H PRN Charlton Amor, PA-C   650 mg at 07/11/21 6948   Or   acetaminophen (TYLENOL) 160 MG/5ML solution 650 mg  650 mg Per Tube Q4H PRN Charlton Amor, PA-C   650 mg at 07/01/21 5462   Or   acetaminophen (TYLENOL) suppository 650 mg  650 mg Rectal Q4H  PRN Angiulli, Mcarthur Rossetti, PA-C       bisacodyl (DULCOLAX) suppository 10 mg  10 mg Rectal Daily PRN Marcello Fennel, MD   10 mg at 07/02/21 1133   cloNIDine (CATAPRES) tablet 0.1 mg  0.1 mg Oral Q6H PRN Erick Colace, MD       docusate sodium (COLACE) capsule 100 mg  100 mg Oral Daily Erick Colace, MD   100 mg at 07/12/21 0810   fluticasone (FLONASE) 50 MCG/ACT nasal spray 2 spray  2 spray Each Nare Daily PRN Marcello Fennel, MD       furosemide (LASIX) tablet 20 mg  20 mg Oral Daily Erick Colace, MD   20 mg at 07/12/21 7035   gabapentin (NEURONTIN) capsule 100 mg  100 mg Oral TID Erick Colace, MD   100 mg at 07/12/21 1334   Gerhardt's butt cream   Topical BID Charlton Amor, PA-C   Given at 07/12/21 1335   hydrALAZINE (APRESOLINE) tablet 25 mg  25 mg Oral Q8H Kirsteins, Victorino Sparrow, MD   25 mg at 07/12/21 1335   lidocaine (XYLOCAINE) 2 % jelly 1 application  1 application Topical PRN Love, Pamela S, PA-C       losartan (COZAAR) tablet 50 mg  50 mg Oral Daily Erick Colace, MD   50 mg at 07/12/21 0810   megestrol (MEGACE) 400 MG/10ML suspension 400 mg  400 mg Oral BID Erick Colace, MD   400 mg at 07/12/21 0093   methylphenidate (RITALIN) tablet 10 mg  10 mg Oral BID WC Erick Colace, MD   10 mg at 07/12/21 1335   ondansetron (ZOFRAN) tablet 4 mg  4 mg Oral Q8H PRN Erick Colace, MD   4 mg at 06/29/21 1200   pantoprazole (PROTONIX) EC tablet 40 mg  40 mg Oral Daily Charlton Amor, PA-C   40 mg at 07/12/21 0810   polyethylene glycol (MIRALAX / GLYCOLAX) packet 17 g  17 g Oral BID Marcello Fennel, MD   17 g at 07/12/21 8182   senna-docusate (Senokot-S) tablet 1 tablet  1 tablet Oral QHS Marcello Fennel, MD   1 tablet at 07/11/21 2047   tamsulosin (FLOMAX) capsule 0.4 mg  0.4 mg Oral QPC supper Erick Colace, MD   0.4 mg at 07/11/21 1849   traMADol (ULTRAM) tablet 50 mg  50 mg Oral Q6H PRN Erick Colace, MD   50 mg at 07/10/21  1947     Discharge Medications: Please see discharge summary for a list of discharge medications.  Relevant Imaging Results:  Relevant Lab Results:   Additional Information XH#[id number]  Gretchen Short, LCSW

## 2021-07-12 NOTE — Plan of Care (Signed)
  Problem: RH Comprehension Communication Goal: LTG Patient will comprehend basic/complex auditory (SLP) Description: LTG: Patient will comprehend basic/complex auditory information with cues (SLP). Outcome: Completed/Met   Problem: RH Expression Communication Goal: LTG Patient will verbally express basic/complex needs(SLP) Description: LTG:  Patient will verbally express basic/complex needs, wants or ideas with cues  (SLP) Outcome: Not Applicable Note: Verbal expression appears more significantly influenced by cognitive factors thus not actively addressing at this time for further focus on improving cognitive skills.

## 2021-07-12 NOTE — Plan of Care (Signed)
  Problem: RH Swallowing Goal: LTG Patient will consume least restrictive diet using compensatory strategies with assistance (SLP) Description: LTG:  Patient will consume least restrictive diet using compensatory strategies with assistance (SLP) Flowsheets (Taken 07/12/2021 1240) LTG: Pt Patient will consume least restrictive diet using compensatory strategies with assistance of (SLP): Supervision Note: Goal advanced due to consistent improvements. Goal: LTG Patient will participate in dysphagia therapy to increase swallow function with assistance (SLP) Description: LTG:  Patient will participate in dysphagia therapy to increase swallow function with assistance (SLP) Flowsheets (Taken 07/12/2021 1240) LTG: Pt will participate in dysphagia therapy to increase swallow function with assistance of (SLP): Minimal Assistance - Patient > 75% Note: Goal advanced due to consistent improvements   Problem: RH Cognition - SLP Goal: RH LTG Patient will demonstrate orientation with cues Description:  LTG:  Patient will demonstrate orientation to person/place/time/situation with cues (SLP)   Flowsheets (Taken 07/12/2021 1240) LTG: Patient will demonstrate orientation using cueing (SLP): Minimal Assistance - Patient > 75% Note: Goal advanced due to consistent improvements   Problem: RH Problem Solving Goal: LTG Patient will demonstrate problem solving for (SLP) Description: LTG:  Patient will demonstrate problem solving for basic/complex daily situations with cues  (SLP) Flowsheets (Taken 07/12/2021 1240) LTG Patient will demonstrate problem solving for: Minimal Assistance - Patient > 75% Note: Goal advanced due to consistent improvements   Problem: RH Attention Goal: LTG Patient will demonstrate this level of attention during functional activites (SLP) Description: LTG:  Patient will will demonstrate this level of attention during functional activites (SLP) Flowsheets (Taken 07/12/2021 1240) LTG: Patient  will demonstrate this level of attention during cognitive/linguistic activities with assistance of (SLP): Minimal Assistance - Patient > 75% Note: Goal advanced due to consistent improvements

## 2021-07-13 ENCOUNTER — Inpatient Hospital Stay (HOSPITAL_COMMUNITY): Payer: Medicare Other

## 2021-07-13 MED ORDER — METHYLPHENIDATE HCL 5 MG PO TABS
5.0000 mg | ORAL_TABLET | Freq: Two times a day (BID) | ORAL | Status: DC
  Administered 2021-07-13 – 2021-07-15 (×4): 5 mg via ORAL
  Filled 2021-07-13 (×4): qty 1

## 2021-07-13 MED ORDER — MEGESTROL ACETATE 400 MG/10ML PO SUSP
200.0000 mg | Freq: Two times a day (BID) | ORAL | Status: DC
  Administered 2021-07-13 – 2021-07-15 (×4): 200 mg via ORAL
  Filled 2021-07-13: qty 5
  Filled 2021-07-13 (×5): qty 10

## 2021-07-13 MED ORDER — MEGESTROL ACETATE 400 MG/10ML PO SUSP
200.0000 mg | Freq: Two times a day (BID) | ORAL | Status: DC
  Filled 2021-07-13: qty 5

## 2021-07-13 NOTE — Progress Notes (Signed)
PROGRESS NOTE   Subjective/Complaints:  Right Shoulder pain last noc , does not recall injury in therapy or prior issues , no hx shoulder surgery    ROS: Denies CP, SOB, N/V/D  Objective:   No results found. Recent Labs    07/11/21 1322  WBC 6.2  HGB 16.0  HCT 48.1  PLT 195    No results for input(s): NA, K, CL, CO2, GLUCOSE, BUN, CREATININE, CALCIUM in the last 72 hours.   Intake/Output Summary (Last 24 hours) at 07/13/2021 0737 Last data filed at 07/13/2021 0600 Gross per 24 hour  Intake 640 ml  Output 600 ml  Net 40 ml          Physical Exam: Vital Signs Blood pressure 125/90, pulse (!) 58, temperature 98.3 F (36.8 C), temperature source Oral, resp. rate 18, height 6\' 2"  (1.88 m), weight 75.5 kg, SpO2 100 %.  General: No acute distress Mood and affect are appropriate Heart: Regular rate and rhythm no rubs murmurs or extra sounds Lungs: Clear to auscultation, breathing unlabored, no rales or wheezes Abdomen: Positive bowel sounds, soft nontender to palpation, nondistended Extremities: No clubbing, cyanosis, or edema Skin: No evidence of breakdown, no evidence of rash MSK- pain with RIght shoulder flex abd and ext rotation, no swelling or erythema, no hypersensitivity  Neuro: Alert Motor: 5/5 in left deltoid, bicep, tricep, grip, hip flexor, knee extensors, ankle dorsiflexor and plantar flexor Sensation- absent to LT in RIght hand  RLE 3- hip knee ext synergy , 3- Right ankle PF  Assessment/Plan: 1. Functional deficits which require 3+ hours per day of interdisciplinary therapy in a comprehensive inpatient rehab setting. Physiatrist is providing close team supervision and 24 hour management of active medical problems listed below. Physiatrist and rehab team continue to assess barriers to discharge/monitor patient progress toward functional and medical goals  Care Tool:  Bathing    Body parts  bathed by patient: Right arm, Chest, Abdomen, Right upper leg, Left upper leg, Face   Body parts bathed by helper: Right arm, Left arm, Front perineal area, Buttocks, Right lower leg, Left lower leg     Bathing assist Assist Level: Moderate Assistance - Patient 50 - 74%     Upper Body Dressing/Undressing Upper body dressing   What is the patient wearing?: Pull over shirt    Upper body assist Assist Level: Maximal Assistance - Patient 25 - 49%    Lower Body Dressing/Undressing Lower body dressing      What is the patient wearing?: Pants, Incontinence brief     Lower body assist Assist for lower body dressing: Maximal Assistance - Patient 25 - 49%     Toileting Toileting    Toileting assist Assist for toileting: 2 Helpers     Transfers Chair/bed transfer  Transfers assist  Chair/bed transfer activity did not occur: Safety/medical concerns  Chair/bed transfer assist level: 2 Helpers     Locomotion Ambulation   Ambulation assist   Ambulation activity did not occur: Safety/medical concerns  Assist level: Moderate Assistance - Patient 50 - 74% Assistive device: Other (comment) (hallway handrail) Max distance: 38 ft   Walk 10 feet activity   Assist  Walk  10 feet activity did not occur: Safety/medical concerns  Assist level: Moderate Assistance - Patient - 50 - 74% Assistive device: Other (comment) (L hallway handrail)   Walk 50 feet activity   Assist Walk 50 feet with 2 turns activity did not occur: Safety/medical concerns         Walk 150 feet activity   Assist Walk 150 feet activity did not occur: Safety/medical concerns         Walk 10 feet on uneven surface  activity   Assist Walk 10 feet on uneven surfaces activity did not occur: Safety/medical concerns         Wheelchair     Assist Is the patient using a wheelchair?: Yes (Per PT long term goals) Type of Wheelchair: Manual Wheelchair activity did not occur: Safety/medical  concerns         Wheelchair 50 feet with 2 turns activity    Assist    Wheelchair 50 feet with 2 turns activity did not occur: Safety/medical concerns       Wheelchair 150 feet activity     Assist  Wheelchair 150 feet activity did not occur: Safety/medical concerns       Blood pressure 125/90, pulse (!) 58, temperature 98.3 F (36.8 C), temperature source Oral, resp. rate 18, height 6\' 2"  (1.88 m), weight 75.5 kg, SpO2 100 %.  Medical Problem List and Plan: 1.  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  Antithrombotics: -DVT/anticoagulation:  Mechanical: Sequential compression devices, below knee Bilateral lower extremities             -antiplatelet therapy: N/A 3. Pain: continue Neurontin 100 mg nightly, hydrocodone as needed  Controlled on 9/27 4. Mood: Provide emotional support             -antipsychotic agents: N/A 5. Neuropsych: This patient is capable of making decisions on his own behalf. 6. Skin/Wound Care: Routine skin checks- added lac hydrin  7. Fluids/Electrolytes/Nutrition: Routine in and outs Continue megestrol  8.  Post stroke dysphagia.  Dysphagia #2 nectar thick liquids.  Follow-up speech therapy, poor intake due to lethargy now on appetite stimulant and neurostimulant   Advance diet as tolerated 9.  Hypertension.  Hydralazine 50 mg every 8 hours, Cozaar 100 mg daily.  Monitor with increased mobility Vitals:   07/12/21 2005 07/13/21 0500  BP: 130/87 125/90  Pulse: 64 (!) 58  Resp: 18 18  Temp: 98.2 F (36.8 C) 98.3 F (36.8 C)  SpO2: 98% 100%  Lasix, hydralazine, Cozaar decreased  controlled on 9/28 10.  Combined congestive heart failure.  Lasix decreased from 40mg  to 20mg   Filed Weights   07/05/21 0331 07/06/21 0456 07/09/21 0318  Weight: 77.2 kg 77 kg 75.5 kg   11.  Chronic bilateral lower extremity edema/cellulitis.   Continue Lasix.  Complete course of doxycycline 12.  Atrial fibrillation.  Eliquis held due to ICH.  Cardiac rate controlled 13.  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  Follow-up cardiology services 14.  Hyperlipidemia.  Currently holding statin due to ICH 15.  Chronic thrombocytopenia.    Platelets 142 on 9/16, await repeat 9/26 16.  Lethargy: Multifactorial  Repeat CT showed no new bleed or infarct, CBC normal and afebrile to doubt infection, Thalamic strokes often cause problems with arousal Methylphenidate initiated improved initiation and participation  Increase methylphenidate to 10mg  BID 17.  Urinary retention,  was on I/O caths but changed to foley after hematuria, urine now clear will do another voiding trial , resume flomax - 9/28 voiding incont 18.  RIght shoulder pain, early /mild frozen shoulder cont ROM LOS: 16 days A FACE TO FACE EVALUATION WAS PERFORMED  Erick Colace 07/13/2021, 7:37 AM

## 2021-07-13 NOTE — Progress Notes (Signed)
Occupational Therapy Session Note  Patient Details  Name: Carlos Welch MRN: [id number] Date of Birth: 1946/02/04  Today's Date: 07/13/2021 OT Individual Time: 8916-9450 OT Individual Time Calculation (min): 40 min    Short Term Goals: Week 1:  OT Short Term Goal 1 (Week 1): Pt will don shirt with mod A. OT Short Term Goal 1 - Progress (Week 1): Not met OT Short Term Goal 2 (Week 1): Pt will don pants with max A.+ LRAD. OT Short Term Goal 2 - Progress (Week 1): Not met OT Short Term Goal 3 (Week 1): Pt will tolerate completing ADL routine 2/3 sessions with no more than min VCs to redirect agitation. OT Short Term Goal 3 - Progress (Week 1): Not met OT Short Term Goal 4 (Week 1): Pt will maintain static sitting with CGA and no posterior LOB for >5 min in prep for seated ADL. OT Short Term Goal 4 - Progress (Week 1): Met Week 2:  OT Short Term Goal 1 (Week 2): Pt will tolerate completing ADL routine 2/3 sessions with no more than min VCs to redirect agitation. OT Short Term Goal 2 (Week 2): Pt will don pants with max A.+ LRAD. OT Short Term Goal 3 (Week 2): Pt will don shirt with mod A. OT Short Term Goal 4 (Week 2): Pt will complete toilet transfer with max A + LRAD.  Skilled Therapeutic Interventions/Progress Updates:  Patient met lying supine in bed in agreement with OT treatment session. Patient reports pain in R flank. Patient in agreement with bathing/dressing seated EOB. Patient requires assist to wash L arm but able to wash chest, abdomen and R arm with cues for thoroughness. Patient with increased tone in RUE. Sit to stand in stedy to wash buttocks in standing. Assist required to wash B lower legs. Patient often stating "I can't" when encouraged to complete tasks. Encouraged to try things before stating that he is unable to do them. Patient with poor frustration tolerance but appropriate for much of session. After bathing/dressing, patient declined transfer to wc despite max coaxing  encouragement. Declined oral hygiene. Session concluded with patient lying supine in bed with call bell within reach, bed alarm activated and all needs met. Missed 15 minutes of OT treatment session.   Therapy Documentation Precautions:  Precautions Precautions: Fall Precaution Comments: R hemi, R inattention, decr R sensation, small subluxation of R shoulder, fluctuating agitation, hypertension, lightheadedness/ blurry vision/ nausea with change in position Restrictions Weight Bearing Restrictions: No General: General OT Amount of Missed Time: 15 Minutes  Therapy/Group: Individual Therapy  Lexany Belknap R [place] 07/13/2021, 11:19 AM

## 2021-07-13 NOTE — Progress Notes (Signed)
Physical Therapy Session Note  Patient Details  Name: Carlos Welch MRN: [id number] Date of Birth: 1946-04-17  Today's Date: 07/13/2021 PT Individual Time: 1829-9371  Short Term Goals: Week 1:  PT Short Term Goal 1 (Week 1): Pt will perform all bed mobility with consistent CGA. PT Short Term Goal 2 (Week 1): Pt will perform dynamic sitting balance tasks with Min A. PT Short Term Goal 3 (Week 1): Pt will perform sit <> stand with Mod A. PT Short Term Goal 4 (Week 1): Pt will initiate gait training with LRAD. PT Short Term Goal 5 (Week 1): Pt will decrease complaint of lightheadedness/ dizziness with mobility by 50%. Week 2:  PT Short Term Goal 1 (Week 2): Pt will perform all bed mobility with consistent CGA. PT Short Term Goal 1 - Progress (Week 2): Progressing toward goal PT Short Term Goal 2 (Week 2): Pt will perform sit <> stand with consistent Min A PT Short Term Goal 2 - Progress (Week 2): Partly met PT Short Term Goal 3 (Week 2): Pt will perform funcitonal seat to seat transfers with Min A. PT Short Term Goal 3 - Progress (Week 2): Not met PT Short Term Goal 4 (Week 2): Pt will ambulate at least 50 feet with consistent Mod A. PT Short Term Goal 4 - Progress (Week 2): Progressing toward goal PT Short Term Goal 5 (Week 2): Pt will perform dynamic sitting balance tasks with CGA. PT Short Term Goal 5 - Progress (Week 2): Met Week 3:  PT Short Term Goal 1 (Week 3): STG = LTG d/t ELOS  Skilled Therapeutic Interventions/Progress Updates:   Pt received supine in bed and agreeable to PT. Supine>sit transfer with min assist with log roll to the R and moderate cues for sequencing. Stand pivot transfer to [facility] with min-mod assist and LEU supported on Arm rest of WC.    WC mobility with hemi technique and min assist for direction cues for attention to task. Gait training with mod-max assist from PT with RW x 7ft and max cues for improved awareness of the RLE throughout to improve gait pattern.  Kinetron. Reciprocal movement training 6 x 1 min with 1 min rest break between bouts with tacktile cues for attention to the RLE throughout. Pt returned to room and performed stand pivot transfer with min assist to block RLE in stand.  Sit>supine completed with supervision assist with cues for awareness RUE. Pt  left supine in bed with call bell in reach and all needs met.        Therapy Documentation Precautions:  Precautions Precautions: Fall Precaution Comments: R hemi, R inattention, decr R sensation, small subluxation of R shoulder, fluctuating agitation, hypertension, lightheadedness/ blurry vision/ nausea with change in position Restrictions Weight Bearing Restrictions: No    Vital Signs: Therapy Vitals Temp: (!) 97.5 F (36.4 C) Temp Source: Oral Pulse Rate: (!) 54 Resp: 17 BP: 129/89 Patient Position (if appropriate): Lying Oxygen Therapy SpO2: 99 % O2 Device: Room Air Pain: denies   Therapy/Group: Individual Therapy  Lorie Phenix 07/13/2021, 2:51 PM

## 2021-07-13 NOTE — Progress Notes (Signed)
Occupational Therapy Session Note  Patient Details  Name: Carlos Welch MRN: [id number] Date of Birth: 02-18-1946  Today's Date: 07/13/2021 OT Individual Time: 8588-5027 OT Individual Time Calculation (min): 44 min    Short Term Goals: Week 2:  OT Short Term Goal 1 (Week 2): Pt will tolerate completing ADL routine 2/3 sessions with no more than min VCs to redirect agitation. OT Short Term Goal 2 (Week 2): Pt will don pants with max A.+ LRAD. OT Short Term Goal 3 (Week 2): Pt will don shirt with mod A. OT Short Term Goal 4 (Week 2): Pt will complete toilet transfer with max A + LRAD.  Skilled Therapeutic Interventions/Progress Updates:   Pt recieved asleep in bed but easily able to arouse. Pt upset that he has not received breakfast yet, RT located tray and brought into room. Pt resistant to working with therapy but eventually agreeable to transition to EOB to don pants. Pt required MIN A for bed mobility needing assist to elevate trunk into sitting. Pt required MOD A to don pants from EOB, MIN A to stand from EOB and pull pants up to waist line. Pt agreeable to light ROM in RUE; pt completiong scapular elevation<>depression x5 reps, digit extension/ flexion x5 reps and elbow extension flexion PROM x5 reps before becoming agitated.  Pt unagreeable to sit EOB to eat breakfast, MIN A to return to supine, total A +2 efforts to reposition pt in bed with pt becoming freustrated that OTA and RT not completing task quick enough. Pt required set- up assist for self feeding, recommend a plate guard to assist for self feeding, unable to locate AD this session. NT enter to supervise remainder of self feeding, pt left supine in bed with bed alarm activated.               Therapy Documentation Precautions:  Precautions Precautions: Fall Precaution Comments: R hemi, R inattention, decr R sensation, small subluxation of R shoulder, fluctuating agitation, hypertension, lightheadedness/ blurry vision/ nausea  with change in position Restrictions Weight Bearing Restrictions: No  Pain: pt reports pain in RUE; offered rest breaks and repositioning with pillow ( however pt declined pillow) and light stretching as pain mgmt strategy.     Therapy/Group: Individual Therapy  Pollyann Glen [facility] 07/13/2021, 8:45 AM

## 2021-07-13 NOTE — Progress Notes (Signed)
Patient ID: Carlos Welch, male   DOB: 07-09-1946, 75 y.o.   MRN: [id number]  SW made attempt to reach pt daughter you discuss SNF approvals/denials, no answer-VM full. SW sent pt daughter a text and e-mail notification for follow up. SW will continue to follow up.  [place], [place] [phone]

## 2021-07-13 NOTE — Progress Notes (Signed)
Patient ID: Carlos Welch, male   DOB: 29-Dec-1945, 75 y.o.   MRN: [id number] Team Conference Report to Patient/Family  Team Conference discussion was reviewed with the patient and caregiver, including goals, any changes in plan of care and target discharge date.  Patient and caregiver express understanding and are in agreement.  The patient has a target discharge date of  (SNF pending).  SW spoke to pt daughter. Pt would like to d/c home. Daughter unable to provide 24/7 supervision. Pt will need SNF placement. Daughter has selected [facility].  [name] 07/13/2021, 1:30 PM

## 2021-07-13 NOTE — Progress Notes (Signed)
Modified Barium Swallow Progress Note  Patient Details  Name: Carlos Welch MRN: [id number] Date of Birth: 1946-10-05  Today's Date: 07/13/2021  Modified Barium Swallow completed.  Full report located under Chart Review in the Imaging Section.  Brief recommendations include the following:  Clinical Impression  Patient presents with an improved swallow function as compared to most recent on 9/9. During oral phase, he exhibited mild delays in anterior to posterior transit of puree and mechanical soft solids and a few instances of trace oral residual s/p initial swallow. With thin liquids and nectar thick liquids taken via cup sips, patient did not exhibit adequate oral containment, leading to premature spillage into vallecular sinus. Swallow was delayed to level of vallecular sinus with solids and nectar thick liquids and was delayed to level of pyriform sinus with thin liquids. Only trace vallecular residuals observed with nectar thick liquids, which cleared fully with subsequent swallows. Patient exhibited consistent flash penetration above vocal cordsPAS 2) and appearing to contact vocal cords (PAS 4) before complete clearance from laryngeal vestibule. No instances of aspiration observed even with large cup sips or straw sips. SLP is recommending to advance patient's diet to thin liquids and continue with Dys 2 solids.   Swallow Evaluation Recommendations       SLP Diet Recommendations: Dysphagia 2 (Fine chop) solids;Thin liquid   Liquid Administration via: Cup;Straw   Medication Administration: Whole meds with puree   Supervision: Patient able to self feed;Full supervision/cueing for compensatory strategies   Compensations: Lingual sweep for clearance of pocketing;Small sips/bites;Slow rate   Postural Changes: Seated upright at 90 degrees   Oral Care Recommendations: Oral care BID;Staff/trained caregiver to provide oral care      Angela Nevin, MA, CCC-SLP Speech Therapy

## 2021-07-13 NOTE — Patient Care Conference (Signed)
Inpatient RehabilitationTeam Conference and Plan of Care Update Date: 07/13/2021   Time: 10:37 AM    Patient Name: Carlos Welch      Medical Record Number: [id number]  Date of Birth: 1945-10-17 Sex: Male         Room/Bed: 4M03C/4M03C-01 Payor Info: Payor: MEDICARE / Plan: MEDICARE PART A AND B / Product Type: *No Product type* /    Admit Date/Time:  06/27/2021  4:24 PM  Primary Diagnosis:  ICH (intracerebral hemorrhage) [facility])  Hospital Problems: Principal Problem:   ICH (intracerebral hemorrhage) (HCC) Active Problems:   Dysphagia, post-stroke   Chronic combined systolic and diastolic congestive heart failure [facility])    Expected Discharge Date: Expected Discharge Date:  (SNF pending)  Team Members Present: Physician leading conference: Dr. Claudette Laws Social Worker Present: Lavera Guise, BSW Nurse Present: Chana Bode, RN PT Present: Grier Rocher, PT OT Present: Roney Mans, OT SLP Present: Eilene Ghazi, SLP PPS Coordinator present : Fae Pippin, SLP     Current Status/Progress Goal Weekly Team Focus  Bowel/Bladder   Incontinent of B/B with minimal continent episodes. LBM 9/25  Patient will regain continence of bowel and bladder  Toilet q2hr and PRN   Swallow/Nutrition/ Hydration   MBS completed 9/28: upgraded liquids to thin  min A for implementation of swallow strategies  MBS, tolerance of current diet, self monitoring anterior spillage and pocketing using mirror   ADL's   MOD A for bathing, +2 MAX for toilet transfer via stand pivot, MAX A for dressing. set- up for self feeding. participation and agitation continues to fluctuate  downgraded goals to MOD A  RUE NMR, self care retraining, functional transfer, activity tolerance and participation   Mobility   Bed mobility = CGA/ Min A with max encouragement, seated balance with supervision; sit<>stand with CGA/ Min A stand/ squat pivot with Mod A when pt participates; gait up to 38 ft with Mod A using  hallway HR to LUE or RW. variable hypertonia with coactivation of trunk and RLE  Bed mobility at supervision/ CGA, Transfers and gait at Mod A; w/c mobility at CGA  standing balance/ tolerance, sitting tolerance,imrpoving activity tolerance, R NMR, improving transfer training, gait training, w/c mobility training prn -- SNF placement?   Communication   decreased thought organization, hesitation and occasional word finding, min A  sup-to-min A  following commands   Safety/Cognition/ Behavioral Observations  min-to-mod  min A  focused and sustained attention, sequencing, orientation with external aids   Pain   No c/o pain  3<=  Assess Qshift and PRN   Skin   MASD to bottom from incontience.  Heal MASD and prevent further breakdown.  Assess qshift and PRN. Apply Gerdharts cream.     Discharge Planning:  SNF placement- TBD   Team Discussion: Foley out; voiding trial in place. Patient with MASD; condom placed due to volume of output and skin. Constant complaints of pain although difficulty to determine if true pain vs discomfort with positioning and expressed as pain. Patient is moody and participation varies.  Patient on target to meet rehab goals: Currently min assist for bed mobility and mod assist for sit - stand. Max assist needed for lower body care and footwear don/doff.   *See Care Plan and progress notes for long and short-term goals.   Revisions to Treatment Plan:  Downgraded goals to mod assist  MBS = upgraded to D2 thin diet Working on basic sequencing and use of external aides for cognition  Teaching Needs:  Safety, medications, secondary risk management, etc  Current Barriers to Discharge: Decreased caregiver support, Home enviroment access/layout, Incontinence, and Behavior  Possible Resolutions to Barriers: SNF recommended for discharge FL2 completed/signed and sent out to facilities     Medical Summary Current Status: BP controlled , incont of bowel and bladder ,  foley is out and pt is voiding  Barriers to Discharge: Medication compliance   Possible Resolutions to [employer] Focus: plan is for SNF , now medically stable   Continued Need for Acute Rehabilitation Level of Care: The patient requires daily medical management by a physician with specialized training in physical medicine and rehabilitation for the following reasons: Direction of a multidisciplinary physical rehabilitation program to maximize functional independence : Yes Medical management of patient stability for increased activity during participation in an intensive rehabilitation regime.: Yes Analysis of laboratory values and/or radiology reports with any subsequent need for medication adjustment and/or medical intervention. : Yes   I attest that I was present, lead the team conference, and concur with the assessment and plan of the team.   Chana Bode B 07/13/2021, 2:55 PM

## 2021-07-13 NOTE — Progress Notes (Signed)
Patient ID: Carlos Welch, male   DOB: Jan 19, 1946, 75 y.o.   MRN: [id number]  NCPASRR# [id number]   Cecile Sheerer, MSW, LCSWA Office: [phone] Cell: [phone] Fax: [phone]

## 2021-07-14 LAB — SARS CORONAVIRUS 2 (TAT 6-24 HRS): SARS Coronavirus 2: NEGATIVE

## 2021-07-14 MED ORDER — SENNOSIDES-DOCUSATE SODIUM 8.6-50 MG PO TABS: 1.0000 | ORAL_TABLET | Freq: Every day | ORAL | Status: AC

## 2021-07-14 MED ORDER — TAMSULOSIN HCL 0.4 MG PO CAPS: 0.4000 mg | ORAL_CAPSULE | Freq: Every day | ORAL | Status: AC

## 2021-07-14 MED ORDER — HYDRALAZINE HCL 25 MG PO TABS: 25.0000 mg | ORAL_TABLET | Freq: Three times a day (TID) | ORAL | Status: AC

## 2021-07-14 MED ORDER — SORBITOL 70 % SOLN
30.0000 mL | Freq: Every day | Status: DC | PRN
  Administered 2021-07-14: 30 mL via ORAL
  Filled 2021-07-14: qty 30

## 2021-07-14 MED ORDER — LOSARTAN POTASSIUM 50 MG PO TABS: 50.0000 mg | ORAL_TABLET | Freq: Every day | ORAL | Status: AC

## 2021-07-14 MED ORDER — FUROSEMIDE 20 MG PO TABS: 20.0000 mg | ORAL_TABLET | Freq: Every day | ORAL | Status: AC

## 2021-07-14 MED ORDER — GABAPENTIN 100 MG PO CAPS: 100.0000 mg | ORAL_CAPSULE | Freq: Three times a day (TID) | ORAL | Status: AC

## 2021-07-14 MED ORDER — METHYLPHENIDATE HCL 5 MG PO TABS: 5.0000 mg | ORAL_TABLET | Freq: Two times a day (BID) | ORAL | 0 refills | Status: DC

## 2021-07-14 MED ORDER — TRAMADOL HCL 50 MG PO TABS: 50.0000 mg | ORAL_TABLET | Freq: Four times a day (QID) | ORAL | 0 refills | Status: DC | PRN

## 2021-07-14 NOTE — Progress Notes (Signed)
Discharge- Inpatient Rehabilitation Medication Review by a Pharmacist  A complete drug regimen review was completed for this patient to identify any potential clinically significant medication issues.  High Risk Drug Classes Is patient taking? Indication by Medication  Antipsychotic No   Anticoagulant No   Antibiotic No   Opioid Yes Tramadol for shoulder pain  Antiplatelet No   Hypoglycemics/insulin No   Vasoactive Medication Yes Hydralazine, losartan with HF and CAD  Chemotherapy No   Other No     Clinically significant medication issues were identified that warrant physician communication and completion of prescribed/recommended actions by midnight of the next day:  No   Time spent performing this drug regimen review (minutes):  10 min   Pasty Spillers 07/14/2021 12:18 PM

## 2021-07-14 NOTE — Plan of Care (Signed)
  Problem: RH Swallowing Goal: LTG Patient will consume least restrictive diet using compensatory strategies with assistance (SLP) Description: LTG:  Patient will consume least restrictive diet using compensatory strategies with assistance (SLP) Outcome: Completed/Met Goal: LTG Patient will participate in dysphagia therapy to increase swallow function with assistance (SLP) Description: LTG:  Patient will participate in dysphagia therapy to increase swallow function with assistance (SLP) Outcome: Completed/Met Goal: LTG Pt will demonstrate functional change in swallow as evidenced by bedside/clinical objective assessment (SLP) Description: LTG: Patient will demonstrate functional change in swallow as evidenced by bedside/clinical objective assessment (SLP) Outcome: Completed/Met   Problem: RH Cognition - SLP Goal: RH LTG Patient will demonstrate orientation with cues Description:  LTG:  Patient will demonstrate orientation to person/place/time/situation with cues (SLP)   Outcome: Completed/Met   Problem: RH Problem Solving Goal: LTG Patient will demonstrate problem solving for (SLP) Description: LTG:  Patient will demonstrate problem solving for basic/complex daily situations with cues  (SLP) Outcome: Completed/Met   Problem: RH Attention Goal: LTG Patient will demonstrate this level of attention during functional activites (SLP) Description: LTG:  Patient will will demonstrate this level of attention during functional activites (SLP) Outcome: Completed/Met

## 2021-07-14 NOTE — Discharge Summary (Addendum)
Physician Discharge Summary  Patient ID: AUDRICK GENCO MRN: [id number] DOB/AGE: 1945/11/18 75 y.o.  Admit date: 06/27/2021 Discharge date: 07/15/2021  Discharge Diagnoses:  Principal Problem:   ICH (intracerebral hemorrhage) (HCC) Active Problems:   Dysphagia, post-stroke   Chronic combined systolic and diastolic congestive heart failure (HCC) Hypertension Chronic bilateral lower extremity edema Atrial fibrillation CAD with CABG Hyperlipidemia  Discharged Condition: Stable  Significant Diagnostic Studies: CT Angio Head W or Wo Contrast  Result Date: 06/23/2021 CLINICAL DATA:  75 year old male code stroke presentation with acute left thalamic hemorrhage. EXAM: CT ANGIOGRAPHY HEAD AND NECK TECHNIQUE: Multidetector CT imaging of the head and neck was performed using the standard protocol during bolus administration of intravenous contrast. Multiplanar CT image reconstructions and MIPs were obtained to evaluate the vascular anatomy. Carotid stenosis measurements (when applicable) are obtained utilizing NASCET criteria, using the distal internal carotid diameter as the denominator. CONTRAST:  71mL OMNIPAQUE IOHEXOL 350 MG/ML SOLN COMPARISON:  Plain head CT 0359 hours today.  Chest CT 08/03/2020. FINDINGS: CTA NECK Skeleton: Absent dentition. Small metallic object embedded at the midline mandible symphysis. Prior sternotomy. No acute osseous abnormality identified. Upper chest: Stable chronic thickening along the superior left major fissure. Other mild pulmonary increased interstitial markings and mild dependent atelectasis appears stable. No superior mediastinal lymphadenopathy. A small volume of retained fluid is noted in the upper thoracic esophagus which is nondilated. Other neck: No acute finding, neck mass, or lymphadenopathy identified. Aortic arch: Tortuous aortic arch with extensive atherosclerosis. 3 vessel arch configuration. Right carotid system: Brachiocephalic artery plaque and  tortuosity without stenosis. Mildly tortuous right CCA with mild plaque, no stenosis. Mild soft and calcified plaque at the right ICA origin and bulb without proximal right ICA stenosis. But in the upper neck at the C1 level there is partially calcified fusiform right ICA pseudoaneurysm extending about 2 cm in length (series 11, image 64) and up to 13 mm diameter. There is no associated stenosis. Left carotid system: Tortuous proximal left CCA with mild calcified plaque and no stenosis. Calcified plaque at the posterior left ICA origin without stenosis. Distal to the bulb there is a saccular pseudoaneurysm of the left ICA directed anteriorly and measuring up to 9 mm diameter, also about 9 mm in length. No associated stenosis. See series 6, image 92 and series 10, image 95. Vertebral arteries: Soft and calcified plaque in the proximal right subclavian artery without significant stenosis. Moderate calcified plaque at the right vertebral artery origin but only mild stenosis results. Additional right V1 and V2 segment calcified plaque with up to 50% stenosis at the C5-C6 level on series 9, image 244. The right vertebral remains patent to the skull base. Mild to moderate proximal left subclavian plaque without significant stenosis. Soft and calcified plaque near the left vertebral artery origin with only mild stenosis. Left vertebral is non dominant and mildly tortuous in the neck but patent to the skull base without additional plaque or stenosis. CTA HEAD Posterior circulation: Dominant right vertebral artery. Extensive bilateral V4 calcified plaque (series 9, image 154) with moderate to severe bilateral V4 segment stenosis proximal to the vertebrobasilar junction. PICA origins and vertebrobasilar junction remain patent. The basilar artery is moderately calcified (series 9, image 115) but without hemodynamically significant stenosis. Bilateral SCA and PCA origins are patent without stenosis. Posterior communicating  arteries are diminutive or absent. Right PCA branches are within normal limits. There is mild left PCA P3 segment irregularity on series 14, image 23 in proximity to the  left thalamic hemorrhage. But there is no CTA spot sign, left PCA aneurysm or other significant stenosis. Anterior circulation: Both ICA siphons are patent but moderately calcified. Still, only mild siphon stenosis results. Patent carotid termini, MCA and ACA origins. Tortuous A1 segments, the right is mildly dominant. Diminutive or absent anterior communicating artery. ACA branches are within normal limits. Left MCA M1 segment and trifurcation are patent without stenosis. Left MCA branches are mildly irregular. Right MCA M1 segment and bifurcation are patent without stenosis. Right MCA branches are within normal limits. Venous sinuses: Early contrast timing, not well evaluated. Anatomic variants: Dominant right vertebral artery. Review of the MIP images confirms the above findings IMPRESSION: 1. Negative for LVO, CTA spot sign, or intracranial aneurysm in association with the left thalamic hemorrhage. There is mild left PCA P3 segment irregularity and stenosis. 2. Positive for bilateral cervical ICA pseudoaneurysms, 13 x 20 mm on the right and 9 x 9 mm on the left. This raises the possibility of underlying Fibromuscular Dysplasia (FMD). There is no aneurism-associated stenosis. 3. Positive also for extensive Calcified Atherosclerosis. 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 There is only mild ICA siphon and Basilar Artery stenosis. Aortic Atherosclerosis (ICD10-I70.0). 4. Partially visible chronic lung disease, prior CABG. Electronically Signed   By: Odessa Fleming M.D.   On: 06/23/2021 04:34   DG Chest 2 View  Result Date: 06/22/2021 CLINICAL DATA:  hx of chf. 9 lb weight gain since 06/13/2021. EXAM: CHEST - 2 VIEW COMPARISON:  02/25/202, FINDINGS: Unchanged cardiomediastinal silhouette with prior median sternotomy and  CABG. Unchanged left atrial occlusion clip. There are diffuse interstitial opacities. Small bilateral pleural effusions. No visible pneumothorax. Bilateral shoulder degenerative changes. No acute osseous abnormality. Thoracic spondylosis. IMPRESSION: Mild pulmonary edema and small bilateral pleural effusions. Electronically Signed   By: Caprice Renshaw M.D.   On: 06/22/2021 11:11   CT HEAD WO CONTRAST ( )  Result Date: 06/30/2021 CLINICAL DATA:  Monocular vision loss. Follow-up intracranial hemorrhage. EXAM: CT HEAD WITHOUT CONTRAST TECHNIQUE: Contiguous axial images were obtained from the base of the skull through the vertex without intravenous contrast. COMPARISON:  MRI 06/24/2021.  CT 06/23/2021. FINDINGS: Brain: No focal abnormality affects the brainstem or cerebellum. Subacute hemorrhage within the left cerebral peduncle, thalamus and adjacent white matter is less distinct, consistent with expected evolutionary change. No evidence of additional bleeding. Surrounding edema as expected. Cerebral hemispheres otherwise show generalized atrophy with chronic small-vessel ischemic changes of the white matter. No large vessel territory infarction. No mass lesion, new hemorrhage, hydrocephalus or extra-axial collection. Vascular: There is atherosclerotic calcification of the major vessels at the base of the brain. Skull: Negative Sinuses/Orbits: Clear/normal Other: None IMPRESSION: Expected evolutionary changes with indistinctness of the hyperdense blood related to the acute hemorrhage in the left cerebral peduncle, thalamus and adjacent white matter tracts. Surrounding vasogenic edema as expected. Extensive chronic small-vessel ischemic changes elsewhere throughout the brain are unchanged. Electronically Signed   By: Paulina Fusi M.D.   On: 06/30/2021 20:14   CT HEAD WO CONTRAST ( )  Result Date: 06/23/2021 CLINICAL DATA:  Follow-up intracranial hemorrhage. EXAM: CT HEAD WITHOUT CONTRAST TECHNIQUE: Contiguous  axial images were obtained from the base of the skull through the vertex without intravenous contrast. COMPARISON:  06/23/2021 3:59 a.m. FINDINGS: Brain: Redemonstrated hyperdense hemorrhage centered in the left thalamus, measuring up to 2.0 x 3.2 x 2.5 cm (AP x TR x CC) (series 3, image 17 and series 5, image 45), unchanged when remeasured similarly.  Again noted is extension of the hemorrhage into the left midbrain. No intraventricular or extra-axial extension of hemorrhage. Unchanged mild surrounding edema with limited regional mass effect. No hydrocephalus. No significant midline shift. Periventricular white matter changes, likely the sequela of chronic small vessel ischemic disease. No acute infarction. Vascular: No hyperdense vessel. Atherosclerotic calcifications in the intracranial carotid and vertebral arteries. Skull: Normal. Negative for fracture or focal lesion. Sinuses/Orbits: No acute finding. Other: The mastoids are well aerated. IMPRESSION: Overall unchanged acute hemorrhage centered in the left thalamus, without intraventricular or extra-axial extension of blood. Unchanged mild surrounding edema and mild regional mass effect. Electronically Signed   By: Wiliam Ke M.D.   On: 06/23/2021 12:21   CT Angio Neck W and/or Wo Contrast  Result Date: 06/23/2021 CLINICAL DATA:  75 year old male code stroke presentation with acute left thalamic hemorrhage. EXAM: CT ANGIOGRAPHY HEAD AND NECK TECHNIQUE: Multidetector CT imaging of the head and neck was performed using the standard protocol during bolus administration of intravenous contrast. Multiplanar CT image reconstructions and MIPs were obtained to evaluate the vascular anatomy. Carotid stenosis measurements (when applicable) are obtained utilizing NASCET criteria, using the distal internal carotid diameter as the denominator. CONTRAST:  50mL OMNIPAQUE IOHEXOL 350 MG/ML SOLN COMPARISON:  Plain head CT 0359 hours today.  Chest CT 08/03/2020. FINDINGS:  CTA NECK Skeleton: Absent dentition. Small metallic object embedded at the midline mandible symphysis. Prior sternotomy. No acute osseous abnormality identified. Upper chest: Stable chronic thickening along the superior left major fissure. Other mild pulmonary increased interstitial markings and mild dependent atelectasis appears stable. No superior mediastinal lymphadenopathy. A small volume of retained fluid is noted in the upper thoracic esophagus which is nondilated. Other neck: No acute finding, neck mass, or lymphadenopathy identified. Aortic arch: Tortuous aortic arch with extensive atherosclerosis. 3 vessel arch configuration. Right carotid system: Brachiocephalic artery plaque and tortuosity without stenosis. Mildly tortuous right CCA with mild plaque, no stenosis. Mild soft and calcified plaque at the right ICA origin and bulb without proximal right ICA stenosis. But in the upper neck at the C1 level there is partially calcified fusiform right ICA pseudoaneurysm extending about 2 cm in length (series 11, image 64) and up to 13 mm diameter. There is no associated stenosis. Left carotid system: Tortuous proximal left CCA with mild calcified plaque and no stenosis. Calcified plaque at the posterior left ICA origin without stenosis. Distal to the bulb there is a saccular pseudoaneurysm of the left ICA directed anteriorly and measuring up to 9 mm diameter, also about 9 mm in length. No associated stenosis. See series 6, image 92 and series 10, image 95. Vertebral arteries: Soft and calcified plaque in the proximal right subclavian artery without significant stenosis. Moderate calcified plaque at the right vertebral artery origin but only mild stenosis results. Additional right V1 and V2 segment calcified plaque with up to 50% stenosis at the C5-C6 level on series 9, image 244. The right vertebral remains patent to the skull base. Mild to moderate proximal left subclavian plaque without significant stenosis.  Soft and calcified plaque near the left vertebral artery origin with only mild stenosis. Left vertebral is non dominant and mildly tortuous in the neck but patent to the skull base without additional plaque or stenosis. CTA HEAD Posterior circulation: Dominant right vertebral artery. Extensive bilateral V4 calcified plaque (series 9, image 154) with moderate to severe bilateral V4 segment stenosis proximal to the vertebrobasilar junction. PICA origins and vertebrobasilar junction remain patent. The basilar artery is  moderately calcified (series 9, image 115) but without hemodynamically significant stenosis. Bilateral SCA and PCA origins are patent without stenosis. Posterior communicating arteries are diminutive or absent. Right PCA branches are within normal limits. There is mild left PCA P3 segment irregularity on series 14, image 23 in proximity to the left thalamic hemorrhage. But there is no CTA spot sign, left PCA aneurysm or other significant stenosis. Anterior circulation: Both ICA siphons are patent but moderately calcified. Still, only mild siphon stenosis results. Patent carotid termini, MCA and ACA origins. Tortuous A1 segments, the right is mildly dominant. Diminutive or absent anterior communicating artery. ACA branches are within normal limits. Left MCA M1 segment and trifurcation are patent without stenosis. Left MCA branches are mildly irregular. Right MCA M1 segment and bifurcation are patent without stenosis. Right MCA branches are within normal limits. Venous sinuses: Early contrast timing, not well evaluated. Anatomic variants: Dominant right vertebral artery. Review of the MIP images confirms the above findings IMPRESSION: 1. Negative for LVO, CTA spot sign, or intracranial aneurysm in association with the left thalamic hemorrhage. There is mild left PCA P3 segment irregularity and stenosis. 2. Positive for bilateral cervical ICA pseudoaneurysms, 13 x 20 mm on the right and 9 x 9 mm on the  left. This raises the possibility of underlying Fibromuscular Dysplasia (FMD). There is no aneurism-associated stenosis. 3. Positive also for extensive Calcified Atherosclerosis. 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 There is only mild ICA siphon and Basilar Artery stenosis. Aortic Atherosclerosis (ICD10-I70.0). 4. Partially visible chronic lung disease, prior CABG. Electronically Signed   By: Odessa Fleming M.D.   On: 06/23/2021 04:34   MR BRAIN W WO CONTRAST  Result Date: 06/24/2021 CLINICAL DATA:  Cerebral hemorrhage suspected. EXAM: MRI HEAD WITHOUT AND WITH CONTRAST TECHNIQUE: Multiplanar, multiecho pulse sequences of the brain and surrounding structures were obtained without and with intravenous contrast. CONTRAST:  7.12mL GADAVIST GADOBUTROL 1 MMOL/ML IV SOLN COMPARISON:  Head CT June 23, 2021 FINDINGS: Brain: No significant interval change in size of the intraparenchymal hematoma in the left thalamus measuring approximately 3.2 x 2.2 x 2.0 cm with surrounding vasogenic edema with mass effect posterior limb of internal capsule and left cerebral peduncle. Multiple foci of susceptibility artifact are seen scattered throughout the supratentorial and infratentorial parenchyma, as well as in the right cortical sulci suggesting amyloid angiopathy. Remote lacunar infarct in the right postcentral gyrus. Scattered and confluent foci of T2 hyperintensity are seen within the white matter of the cerebral hemispheres and within the pons, nonspecific, most likely related to chronic small vessel ischemia. No focus of abnormal contrast enhancement identified. No evidence of ischemic infarct, hydrocephalus or extra-axial collection. Vascular: Normal flow voids. Skull and upper cervical spine: Normal marrow signal. Sinuses/Orbits: Negative. Other: None. IMPRESSION: 1. Stable size of intraparenchymal hematoma centered in the left thalamus with surrounding vasogenic edema and regional mass effect.  2. 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 3. No focus of abnormal contrast enhancement. Electronically Signed   By: Baldemar Lenis M.D.   On: 06/24/2021 09:19   Chest Port 1 View  Result Date: 06/23/2021 CLINICAL DATA:  75 year old male code stroke presentation, left thalamic hemorrhage. EXAM: PORTABLE CHEST 1 VIEW COMPARISON:  Chest radiographs 06/22/2021 and earlier. FINDINGS: Portable AP upright view at 0442 hours. Lower lung volumes. Cardiomegaly and diffuse bilateral pulmonary interstitial opacity appear mildly increased from yesterday. Small right pleural  effusion is stable. No pneumothorax. Visualized tracheal air column is within normal limits. Prior CABG. No acute osseous abnormality identified. IMPRESSION: Lower lung volumes with increased appearance of cardiomegaly and acute pulmonary edema since yesterday. Stable right pleural effusion. Electronically Signed   By: Odessa Fleming M.D.   On: 06/23/2021 05:42   DG Swallowing Func-Speech Pathology  Result Date: 07/13/2021 Table formatting from the original result was not included. Objective Swallowing Evaluation: Type of Study: MBS-Modified Barium Swallow Study  Patient Details Name: ISAEL STILLE MRN: [id number] Date of Birth: Oct 13, 1946 Today's Date: 07/13/2021 Time: SLP Start Time (ACUTE ONLY): 0820 -SLP Stop Time (ACUTE ONLY): 0835 SLP Time Calculation (min) (ACUTE ONLY): 15 min Past Medical History: Past Medical History: Diagnosis Date  A-fib (HCC)   Abscess of jaw 07/19/2020  Acute on chronic heart failure with preserved ejection fraction (HCC) 07/08/2020  Aortic stenosis   a. s/p pericardial AVR 2015.  Aortic valve stenosis 07/19/2020  Bacteremia 07/19/2020  Bacterial endocarditis   BPH (benign prostatic hyperplasia) 07/08/2020  CAD (coronary artery disease)   a. s/p CABG, AVR, LAA clipping 2015  at [facility].  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/08/2020  Elevated troponin 07/08/2020  Essential hypertension 07/08/2020  Generalized abdominal pain   History of noncompliance with medical treatment   Hyperlipemia 07/08/2020  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  Malnutrition (HCC)   MRSA bacteremia   Nonrheumatic mitral valve regurgitation 07/19/2020  Normocytic anemia 07/08/2020  Pressure injury of skin 05/12/2020  S/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 2015  Severe malnutrition (HCC) 07/19/2020  Thrombocytopenia (HCC) 07/08/2020  TIA (transient ischemic attack)   Weight loss 07/19/2020 Past Surgical History: Past Surgical History: Procedure Laterality Date  AORTIC VALVE REPLACEMENT    CORONARY ARTERY BYPASS GRAFT  2015  HERNIA REPAIR    REMOVAL OF IMPLANT  03/14/2020  TEE WITHOUT CARDIOVERSION N/A 05/13/2020  Procedure: TRANSESOPHAGEAL ECHOCARDIOGRAM (TEE);  Surgeon: Lewayne Bunting, MD;  Location: [facility] ENDOSCOPY;  Service: Cardiovascular;  Laterality: N/A; HPI: 75 y.o. male with PMH: aFib, aortic stenosis, CAD, bilateral lower extremity cellulitis, combined CHF, HTN, HLD, thrombocytopenia, presented toEd via EMS for evaluation of abrupt onset of right-sided weakness and numbness. LVO positive code stroke was activated. Noncontrasted head CT showed left thalamic bleed around 9 cc without IVH.  Subjective: Pt awake and alert but having trouble keeping eyes open. Assessment / Plan / Recommendation CHL IP CLINICAL IMPRESSIONS 07/13/2021 Clinical Impression Patient presents with an improved swallow function as compared to most recent on  9/9. During oral phase, he exhibited mild delays in anterior to posterior transit of puree and mechanical soft solids and a few instances of trace oral residual s/p initial swallow. With thin liquids and nectar thick liquids taken via cup sips, patient did not exhibit adequate oral containment, leading to premature spillage into vallecular sinus. Swallow was delayed to level of vallecular sinus with solids and nectar thick liquids and was delayed to level of pyriform sinus with thin liquids. Only trace vallecular residuals observed with nectar thick liquids, which cleared fully with subsequent swallows. Patient exhibited consistent flash penetration above vocal cordsPAS 2) and appearing to contact vocal cords (PAS 4) before complete clearance  from laryngeal vestibule. No instances of aspiration observed even with large cup sips or straw sips. SLP is recommending to advance patient's diet to thin liquids and continue with Dys 2 solids. SLP Visit Diagnosis Dysphagia, oropharyngeal phase (R13.12) Attention and concentration deficit following -- Frontal lobe and executive function deficit following -- Impact on safety and function Mild aspiration risk;Moderate aspiration risk   CH[facility] IP TREATMENT RECOMMENDATION 06/24/2021 Treatment Recommendations Therapy as outlined in treatment plan below   Prognosis 07/13/2021 Prognosis for Safe Diet Advancement Good Barriers to Reach Goals -- Barriers/Prognosis Comment -- CH[facility] IP DIET RECOMMENDATION 07/13/2021 SLP Diet Recommendations Dysphagia 2 (Fine chop) solids;Thin liquid Liquid Administration via Cup;Straw Medication Administration Whole meds with puree Compensations Lingual sweep for clearance of pocketing;Small sips/bites;Slow rate Postural Changes Seated upright at 90 degrees   CHL IP OTHER RECOMMENDATIONS 07/13/2021 Recommended Consults -- Oral Care Recommendations Oral care BID;Staff/trained caregiver to provide oral care Other Recommendations --   CHL IP FOLLOW UP  RECOMMENDATIONS 06/24/2021 Follow up Recommendations Inpatient Rehab   CHL IP FREQUENCY AND DURATION 06/24/2021 Speech Therapy Frequency (ACUTE ONLY) min 2x/week Treatment Duration 2 weeks      CHL IP ORAL PHASE 07/13/2021 Oral Phase Impaired Oral - Pudding Teaspoon -- Oral - Pudding Cup -- Oral - Honey Teaspoon -- Oral - Honey Cup -- Oral - Nectar Teaspoon -- Oral - Nectar Cup Premature spillage Oral - Nectar Straw NT Oral - Thin Teaspoon -- Oral - Thin Cup Premature spillage Oral - Thin Straw WFL Oral - Puree Delayed oral transit Oral - Mech Soft Delayed oral transit Oral - Regular -- Oral - Multi-Consistency -- Oral - Pill WFL Oral Phase - Comment --  CHL IP PHARYNGEAL PHASE 07/13/2021 Pharyngeal Phase Impaired Pharyngeal- Pudding Teaspoon -- Pharyngeal -- Pharyngeal- Pudding Cup -- Pharyngeal -- Pharyngeal- Honey Teaspoon -- Pharyngeal -- Pharyngeal- Honey Cup -- Pharyngeal -- Pharyngeal- Nectar Teaspoon -- Pharyngeal -- Pharyngeal- Nectar Cup Delayed swallow initiation-vallecula;Pharyngeal residue - valleculae Pharyngeal -- Pharyngeal- Nectar Straw NT Pharyngeal -- Pharyngeal- Thin Teaspoon -- Pharyngeal -- Pharyngeal- Thin Cup Delayed swallow initiation-pyriform sinuses;Penetration/Aspiration during swallow Pharyngeal Material enters airway, CONTACTS cords and then ejected out;Material enters airway, remains ABOVE vocal cords then ejected out Pharyngeal- Thin Straw Delayed swallow initiation-pyriform sinuses;Penetration/Aspiration during swallow Pharyngeal Material enters airway, CONTACTS cords and then ejected out;Material enters airway, remains ABOVE vocal cords then ejected out Pharyngeal- Puree Delayed swallow initiation-vallecula Pharyngeal -- Pharyngeal- Mechanical Soft Delayed swallow initiation-vallecula Pharyngeal -- Pharyngeal- Regular -- Pharyngeal -- Pharyngeal- Multi-consistency -- Pharyngeal -- Pharyngeal- Pill WFL Pharyngeal -- Pharyngeal Comment --  CHL IP CERVICAL ESOPHAGEAL PHASE 07/13/2021 Cervical  Esophageal Phase WFL Pudding Teaspoon -- Pudding Cup -- Honey Teaspoon -- Honey Cup -- Nectar Teaspoon -- Nectar Cup -- Nectar Straw -- Thin Teaspoon -- Thin Cup -- Thin Straw -- Puree -- Mechanical Soft -- Regular -- Multi-consistency -- Pill -- Cervical Esophageal Comment -- Angela Nevin, MA, CCC-SLP Speech Therapy             DG Swallowing Func-Speech Pathology  Result Date: 06/24/2021 Table formatting from the original result was not included. Objective Swallowing Evaluation: Type of Study: MBS-Modified Barium Swallow Study  Patient Details Name: LEEUM SANKEY MRN: [id number] Date of Birth: [birth year] Today's Date: 06/24/2021 Time: SLP Start Time (ACUTE ONLY): 0820 -SLP Stop Time (ACUTE ONLY): 0835 SLP Time Calculation (min) (ACUTE ONLY): 15 min Past Medical History: Past Medical History: Diagnosis Date  A-fib (HCC)   Abscess of jaw 07/19/2020  Acute on  chronic heart failure with preserved ejection fraction (HCC) 07/08/2020  Aortic stenosis   a. s/p pericardial AVR 2015.  Aortic valve stenosis 07/19/2020  Bacteremia 07/19/2020  Bacterial endocarditis   BPH (benign prostatic hyperplasia) 07/08/2020  CAD (coronary artery disease)   a. s/p CABG, AVR, LAA clipping 2015 at [facility].  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/08/2020  Elevated troponin 07/08/2020  Essential hypertension 07/08/2020  Generalized abdominal pain   History of noncompliance with medical treatment   Hyperlipemia 07/08/2020  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  Malnutrition (HCC)   MRSA bacteremia   Nonrheumatic mitral valve regurgitation 07/19/2020  Normocytic anemia 07/08/2020   Pressure injury of skin 05/12/2020  S/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 2015  Severe malnutrition (HCC) 07/19/2020  Thrombocytopenia (HCC) 07/08/2020  TIA (transient ischemic attack)   Weight loss 07/19/2020 Past Surgical History: Past Surgical History: Procedure Laterality Date  AORTIC VALVE REPLACEMENT    CORONARY ARTERY BYPASS GRAFT  2015  HERNIA REPAIR    REMOVAL OF IMPLANT  03/14/2020  TEE WITHOUT CARDIOVERSION N/A 05/13/2020  Procedure: TRANSESOPHAGEAL ECHOCARDIOGRAM (TEE);  Surgeon: Lewayne Bunting, MD;  Location: [facility] ENDOSCOPY;  Service: Cardiovascular;  Laterality: N/A; HPI: 75 y.o. male with PMH: aFib, aortic stenosis, CAD, bilateral lower extremity cellulitis, combined CHF, HTN, HLD, thrombocytopenia, presented toEd via EMS for evaluation of abrupt onset of right-sided weakness and numbness. LVO positive code stroke was activated. Noncontrasted head CT showed left thalamic bleed around 9 cc without IVH.  Subjective: Pt awake and alert but having trouble keeping eyes open. Assessment / Plan / Recommendation CHL IP CLINICAL IMPRESSIONS 06/24/2021 Clinical Impression Mild-mod oropharyngeal demonstrated with silent aspiration. Mild difficulty maintainng bolus cohesion resulting in sublingual spillage with thin barium. He exhibited rapid and inefficient mastication of graham cracker pieces in puree. He achieves adequate laryngeal vestibular closure but mistimed leading to pyriform sinus filling and penetration (PAS 3, 4)  (throat clear) and aspiration (PAS 8 silent) ensues. Compensatory strategies not attempted at this time given cognitive-linguistic impairments and as he improves, will be able to evaluate. Vallecular residue was minimal and not problematic. Timing and safety with nectar thick was improved. Recommend continue nectar and Dys 2 texture, pills crushed, check for oral pocketing, straws allowed and assist when eating. ST to follow. SLP Visit Diagnosis Dysphagia, oropharyngeal phase  (R13.12) Attention and concentration deficit following -- Frontal lobe and executive function deficit following -- Impact on safety and function Moderate aspiration risk   CHL IP TREATMENT RECOMMENDATION 06/24/2021 Treatment Recommendations Therapy as outlined in treatment plan below   Prognosis 06/24/2021 Prognosis for Safe Diet Advancement Good Barriers to Reach Goals Cognitive deficits;Language deficits Barriers/Prognosis Comment -- CHL IP DIET RECOMMENDATION 06/24/2021 SLP Diet Recommendations Dysphagia 2 (Fine chop) solids;Nectar thick liquid Liquid Administration via Cup;Straw Medication Administration Crushed with puree Compensations Lingual sweep for clearance of pocketing;Small sips/bites;Slow rate;Clear throat intermittently Postural Changes Seated upright at 90 degrees   CHL IP OTHER RECOMMENDATIONS 06/24/2021 Recommended Consults -- Oral Care Recommendations Oral care BID Other Recommendations --   CHL IP FOLLOW UP RECOMMENDATIONS 06/24/2021 Follow up Recommendations Inpatient Rehab   CHL IP FREQUENCY AND DURATION 06/24/2021 Speech Therapy Frequency (ACUTE ONLY) min 2x/week  Treatment Duration 2 weeks      CHL IP ORAL PHASE 06/24/2021 Oral Phase Impaired Oral - Pudding Teaspoon -- Oral - Pudding Cup -- Oral - Honey Teaspoon -- Oral - Honey Cup -- Oral - Nectar Teaspoon -- Oral - Nectar Cup Decreased bolus cohesion Oral - Nectar Straw WFL Oral - Thin Teaspoon -- Oral - Thin Cup Right anterior bolus loss Oral - Thin Straw WFL Oral - Puree -- Oral - Mech Soft (No Data) Oral - Regular -- Oral - Multi-Consistency -- Oral - Pill -- Oral Phase - Comment --  CHL IP PHARYNGEAL PHASE 06/24/2021 Pharyngeal Phase Impaired Pharyngeal- Pudding Teaspoon -- Pharyngeal -- Pharyngeal- Pudding Cup -- Pharyngeal -- Pharyngeal- Honey Teaspoon -- Pharyngeal -- Pharyngeal- Honey Cup -- Pharyngeal -- Pharyngeal- Nectar Teaspoon -- Pharyngeal -- Pharyngeal- Nectar Cup WFL Pharyngeal -- Pharyngeal- Nectar Straw Delayed swallow initiation-pyriform  sinuses Pharyngeal -- Pharyngeal- Thin Teaspoon -- Pharyngeal -- Pharyngeal- Thin Cup Penetration/Aspiration during swallow;Pharyngeal residue - valleculae;Delayed swallow initiation-pyriform sinuses Pharyngeal Material enters airway, CONTACTS cords and then ejected out;Material enters airway, remains ABOVE vocal cords and not ejected out Pharyngeal- Thin Straw Penetration/Aspiration during swallow;Delayed swallow initiation-pyriform sinuses Pharyngeal Material enters airway, passes BELOW cords without attempt by patient to eject out (silent aspiration) Pharyngeal- Puree -- Pharyngeal -- Pharyngeal- Mechanical Soft Pharyngeal residue - valleculae;Pharyngeal residue - pyriform Pharyngeal -- Pharyngeal- Regular -- Pharyngeal -- Pharyngeal- Multi-consistency -- Pharyngeal -- Pharyngeal- Pill -- Pharyngeal -- Pharyngeal Comment --  CHL IP CERVICAL ESOPHAGEAL PHASE 06/24/2021 Cervical Esophageal Phase WFL Pudding Teaspoon -- Pudding Cup -- Honey Teaspoon -- Honey Cup -- Nectar Teaspoon -- Nectar Cup -- Nectar Straw -- Thin Teaspoon -- Thin Cup -- Thin Straw -- Puree -- Mechanical Soft -- Regular -- Multi-consistency -- Pill -- Cervical Esophageal Comment -- Royce Macadamia 06/24/2021, 9:48 AM              ECHOCARDIOGRAM COMPLETE  Result Date: 06/23/2021    ECHOCARDIOGRAM REPORT   Patient Name:   NICOLAE VASEK Reichl Date of Exam: 06/23/2021 Medical Rec #:  [id number]    Height:       74.0 in Accession #:    [id number]   Weight:       190.0 lb Date of Birth:  [birth year]   BSA:          2.127 m Patient Age:    74 years     BP:           125/74 mmHg Patient Gender: M            HR:           66 bpm. Exam Location:  Inpatient Procedure: 2D Echo, 3D Echo, Cardiac Doppler and Color Doppler Indications:    Stroke  History:        Patient has prior history of Echocardiogram examinations, most                 recent 08/09/2020. CHF, Abnormal ECG and Prior CABG, TIA, Aortic                 Valve Disease and Mitral Valve Disease,  Arrythmias:Atrial                 Fibrillation, Signs/Symptoms:Bacteremia; Risk                 Factors:Hypertension. Aortic stenosis.  Sonographer:    Sheralyn Boatman RDCS Referring Phys: [id number] ASHISH ARORA  Sonographer Comments: Technically difficult study due to  poor echo windows. Study interrupted. Patient supine with thin habitus. IMPRESSIONS  1. Left ventricular ejection fraction, by estimation, is 60 to 65%. The left ventricle has normal function. The left ventricle has no regional wall motion abnormalities. There is moderate left ventricular hypertrophy. Left ventricular diastolic parameters are indeterminate.  2. Right ventricular systolic function is moderately reduced. The right ventricular size is mildly enlarged. Tricuspid regurgitation signal is inadequate for assessing PA pressure.  3. Left atrial size was mildly dilated.  4. Right atrial size was moderately dilated.  5. The mitral valve is abnormal. Moderate mitral annular calcification. Eccentric mitral regurgitation jet, poorly visualized on current study, however was moderate to severe on TEE 04/2020. There does appear to be pulmonic vein systolic reversal, which suggests severe MR. Consider TEE for further evaluation  6. There is a bioprosthetic valve present in the aortic position     Aortic valve regurgitation is not visualized. Echo findings are consistent with normal structure and function of the aortic valve prosthesis. Aortic valve mean gradient measures 11.0 mmHg, stable from prior echoes  7. Aortic dilatation noted. 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  8. The inferior vena cava is dilated in size with <50% respiratory variability, suggesting right atrial pressure of 15 mmHg. Conclusion(s)/Recommendation(s): No intracardiac source of embolism detected on this transthoracic study. A transesophageal echocardiogram is recommended to exclude cardiac source of embolism if clinically indicated. FINDINGS  Left Ventricle: Left  ventricular ejection fraction, by estimation, is 60 to 65%. The left ventricle has normal function. The left ventricle has no regional wall motion abnormalities. The left ventricular internal cavity size was normal in size. There is  moderate left ventricular hypertrophy. Left ventricular diastolic parameters are indeterminate. Right Ventricle: The right ventricular size is mildly enlarged. Right vetricular wall thickness was not well visualized. Right ventricular systolic function is moderately reduced. Tricuspid regurgitation signal is inadequate for assessing PA pressure. Left Atrium: Left atrial size was mildly dilated. Right Atrium: Right atrial size was moderately dilated. Pericardium: There is no evidence of pericardial effusion. Mitral Valve: The mitral valve is abnormal. Moderate mitral annular calcification. Moderate to severe mitral valve regurgitation. MV peak gradient, 15.4 mmHg. The mean mitral valve gradient is 3.5 mmHg. Tricuspid Valve: The tricuspid valve is normal in structure. Tricuspid valve regurgitation is not demonstrated. Aortic Valve: The aortic valve has been repaired/replaced. Aortic valve regurgitation is not visualized. Aortic valve mean gradient measures 11.0 mmHg. Aortic valve peak gradient measures 22.7 mmHg. Aortic valve area, by VTI measures 1.55 cm. There is a  bioprosthetic valve present in the aortic position. Echo findings are consistent with normal structure and function of the aortic valve prosthesis. Pulmonic Valve: The pulmonic valve was not well visualized. Pulmonic valve regurgitation is not visualized. Aorta: The aortic root is normal in size and structure and aortic dilatation noted. 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 Venous: The inferior vena cava is dilated in size with less than 50% respiratory variability, suggesting right atrial pressure of 15 mmHg. IAS/Shunts: The interatrial septum was not well visualized.  LEFT VENTRICLE PLAX 2D LVIDd:          5.50 cm LVIDs:         4.00 cm LV PW:         1.40 cm LV IVS:        1.40 cm LVOT diam:     2.00 cm     3D Volume EF: LV SV:  78          3D EF:        59 % LV SV Index:   37          LV EDV:       225 ml LVOT Area:     3.14 cm    LV ESV:       92 ml                            LV SV:        133 ml  LV Volumes (MOD) LV vol d, MOD A2C: 87.4 ml LV vol d, MOD A4C: 81.4 ml LV vol s, MOD A2C: 38.7 ml LV vol s, MOD A4C: 25.1 ml LV SV MOD A2C:     48.7 ml LV SV MOD A4C:     81.4 ml LV SV MOD BP:      54.4 ml RIGHT VENTRICLE            IVC RV S prime:     4.56 cm/s  IVC diam: 3.10 cm TAPSE (M-mode): 0.7 cm LEFT ATRIUM             Index       RIGHT ATRIUM           Index LA diam:        5.20 cm 2.45 cm/m  RA Area:     28.20 cm LA Vol (A2C):   65.3 ml 30.71 ml/m RA Volume:   92.30 ml  43.40 ml/m LA Vol (A4C):   93.4 ml 43.92 ml/m LA Biplane Vol: 82.6 ml 38.84 ml/m  AORTIC VALVE AV Area (Vmax):    1.70 cm AV Area (Vmean):   1.64 cm AV Area (VTI):     1.55 cm AV Vmax:           238.00 cm/s AV Vmean:          154.000 cm/s AV VTI:            0.502 m AV Peak Grad:      22.7 mmHg AV Mean Grad:      11.0 mmHg LVOT Vmax:         129.00 cm/s LVOT Vmean:        80.500 cm/s LVOT VTI:          0.248 m LVOT/AV VTI ratio: 0.49  AORTA Ao Root diam: 3.70 cm Ao Asc diam:  4.30 cm MITRAL VALVE MV Area (PHT): 2.60 cm     SHUNTS MV Area VTI:   1.46 cm     Systemic VTI:  0.25 m MV Peak grad:  15.4 mmHg    Systemic Diam: 2.00 cm MV Mean grad:  3.5 mmHg MV Vmax:       1.96 m/s MV Vmean:      78.8 cm/s MV Decel Time: 292 msec MV E velocity: 177.00 cm/s Epifanio Lesches MD Electronically signed by Epifanio Lesches MD Signature Date/Time: 06/23/2021/4:07:23 PM    Final    CT HEAD CODE STROKE WO CONTRAST  Addendum Date: 06/23/2021   ADDENDUM REPORT: 06/23/2021 04:34 ADDENDUM: Salient findings were communicated to Dr. Wilford Corner at 0416 hours on 06/23/2021 by text page via the [facility] messaging system. Electronically Signed    By: Odessa Fleming M.D.   On: 06/23/2021 04:34   Result Date: 06/23/2021 CLINICAL DATA:  Code stroke.  75 year old male. EXAM: CT HEAD WITHOUT CONTRAST TECHNIQUE: Contiguous  axial images were obtained from the base of the skull through the vertex without intravenous contrast. COMPARISON:  Head CT 04/14/2014. FINDINGS: Brain: Oval and crescent-shaped hyperdense hemorrhage centered within the left thalamus encompasses 21 x 32 x 28 mm (AP by transverse by CC) for an estimated volume 9 mL. No intraventricular or extra-axial extension of blood is identified. There is surrounding edema but only mild regional mass effect. A small focus of hemorrhage tracks into the left midbrain (series 5, image 41). Elsewhere patchy bilateral cerebral white matter hypodensity and a chronic appearing lacunar infarct of the left basal ganglia have progressed since 2015. Overall cerebral volume has not significantly changed. No midline shift, ventriculomegaly, or evidence of cortically based acute infarction. Vascular: Extensive Calcified atherosclerosis at the skull base. There is a degree of generalized intracranial artery tortuosity. No suspicious intracranial vascular hyperdensity. Skull: Stable and intact. Sinuses/Orbits: Visualized paranasal sinuses and mastoids are stable and well aerated. Other: There is scattered scalp vessel calcified atherosclerosis. No acute orbit or scalp soft tissue finding. ASPECTS [facility] Stroke Program Early CT Score) Total score (0-10 with 10 being normal): Not applicable, acute hemorrhage. IMPRESSION: 1. Acute hemorrhage centered in the left thalamus with an estimated volume of 9 mL. No intraventricular or extra-axial extension of blood. Surrounding edema but only mild regional mass effect. 2. 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 3. Generalized intracranial artery tortuosity. Electronically Signed: By: Odessa Fleming M.D. On: 06/23/2021 04:18    Labs:  Basic Metabolic  Panel: No results for input(s): NA, K, CL, CO2, GLUCOSE, BUN, CREATININE, CALCIUM, MG, PHOS in the last 168 hours.  CBC: Recent Labs  Lab 07/11/21 1322  WBC 6.2  NEUTROABS 4.5  HGB 16.0  HCT 48.1  MCV 86.5  PLT 195    CBG: No results for input(s): GLUCAP in the last 168 hours.  Family history positive for hypertension.  Denies any colon cancer esophageal cancer or rectal cancer  Brief HPI:   MELVERN RAMONE is a 75 y.o. right-handed male with history of atrial fibrillation maintained on Eliquis, aortic stenosis status post aortic valve replacement CAD with CABG bilateral lower extremity cellulitis combined CHF hypertension hyperlipidemia remote tobacco use.  Per chart review lives with daughter and family.  Presented 06/23/2021 with acute onset of right-sided weakness and slurred speech.  Cranial CT scan showed acute hemorrhage centered in the left thalamus with an estimated volume of 9 mL.  No intraventricular or extra-axial extension of blood.  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  His chronic Eliquis was discontinued due to hematoma.  CT angiogram head and neck negative for LVO.  Positive for bilateral cervical ICA pseudoaneurysms, 13 x 20 mm on the right 9 x 9 mm on the left.  Patient did not receive tPA due to hematoma.  MRI of the brain follow-up showed stable size of intraparenchymal hematoma.  Admission chemistries unremarkable except BUN 27.  Echocardiogram with ejection fraction of 60 to 65% no wall motion abnormalities.  Maintain on a dysphagia #2 nectar thick liquid diet.  Therapy evaluations completed due to patient's decreased functional mobility was admitted for a comprehensive rehab program.   Hospital Course: Kostas Marrow Vowles was admitted to rehab 06/27/2021 for inpatient therapies to consist of PT, ST and OT at least three hours five days a week. Past admission physiatrist, therapy team and rehab RN have worked together to provide customized  collaborative inpatient rehab.  Pertain to patient's  left ICH secondary to Eliquis as well as hypertension remained stable.  He would follow neurology services.  Eliquis has been discontinued.  Post stroke dysphagia now maintained on dysphagia #3 thin liquid diet.  Blood pressure controlled on Lasix as well as hydralazine and Cozaar.  He exhibited no signs of fluid overload.  In regards to patient's ICH Ritalin was initiated to help patient maintain focus and attention to task.  He was using Ultram on a limited basis for pain.  Patient did have history of CAD with CABG aortic stenosis aortic valve replacement follow-up cardiology services as noted he will currently remain off Eliquis therapy.  Hyperlipidemia statin drug on hold due to ICH.   Blood pressures were monitored on TID basis and soft and monitored     Rehab course: During patient's stay in rehab weekly team conferences were held to monitor patient's progress, set goals and discuss barriers to discharge. At admission, patient required max assist ambulation moderate assist supine to sit  Physical exam.  Blood pressure 120/83 pulse 72 temperature 98 respirations 18 oxygen saturation 97% room air Constitutional.  No acute distress HEENT Head.  Normocephalic and atraumatic Eyes.  Pupils round and reactive to light no discharge without nystagmus Neck.  Supple nontender no JVD without thyromegaly Cardiac regular rate rhythm no extra sounds or murmur heard Abdomen.  Soft nontender positive bowel sounds without rebound Respiratory effort normal no respiratory distress without wheeze Neurologic.  Alert he knows he is in the hospital but cannot provide accurate date and time.  Right side inattention.  Mild dysarthria but intelligible.  Right upper extremity 0/5 right lower extremity 2/5  He/She  has had improvement in activity tolerance, balance, postural control as well as ability to compensate for deficits. He/She has had improvement in  functional use RUE/LUE  and RLE/LLE as well as improvement in awareness.  Supine to sit transfers with minimal assist.  Stand pivot transfers to wheelchair with min mod assist.  Wheelchair mobility with Hemi technique and minimal assist for directions.  Ambulates 25 feet rolling walker mod max assist.  Sit to stand to wash buttocks and standing.  Assist required to wash bilateral lower legs.  Speech therapy follow-up for dysphagia patient exhibited consistent flash penetration.  Due to ongoing need for physical assistance recommendations for skilled nursing facility.       Disposition: Skilled nursing facility   Diet: Dysphagia #3 thin liquids  Special Instructions: Continue to hold Eliquis until follow-up with neurology as well as cardiology services  Medications at discharge. 1.  Tylenol as needed 2.  Colace 100 mg p.o. daily 3.  Lasix 20 mg p.o. daily 4.  Neurontin 100 mg p.o. 3 times daily 5.  Hydralazine 25 mg p.o. every 8 hours 6.  Cozaar 50 mg p.o. daily 7.  Ritalin 5 mg p.o. twice daily 8.  Protonix 40 mg p.o. daily 9.  Flomax 0.4 mg daily after supper 10.  Tramadol 50 mg every 6 hours as needed severe pain  30-35 minutes were spent completing discharge summary and discharge planning  Discharge Instructions     Ambulatory referral to Neurology   Complete by: As directed    An appointment is requested in approximately: 4 weeks left thalamic ICH   Ambulatory referral to Physical Medicine Rehab   Complete by: As directed    Follow-up 1 month ICH.  Patient for skilled nursing facility placement        Contact information for follow-up providers  Kirsteins, Victorino Sparrow, MD Follow up.   Specialty: Physical Medicine and Rehabilitation Why: Office to call for appointment Contact information: [address] [place] [address] [place] [address] [phone]              Contact information for after-discharge care     Destination     [facility]  Preferred SNF .   Service: Skilled Nursing Contact information: [address] [place] [address] [phone]                     Signed: Charlton Amor 07/15/2021, 5:17 AM

## 2021-07-14 NOTE — Progress Notes (Signed)
Occupational Therapy Discharge Summary  Patient Details  Name: Carlos Welch MRN: [id number] Date of Birth: November 06, 1945    Patient has met 8 of 45 long term goals due to improved activity tolerance, improved balance, postural control, ability to compensate for deficits, functional use of  RIGHT upper and RIGHT lower extremity, improved attention, improved awareness, and improved coordination.  Patient to discharge at overall Mod Assist- MAX level.  Patient & family agree with SNF DC to improve pt independene and decrease BOC prior to going home.  Reasons goals not met: Pt DC SNF with quick placement to conitnue to work towards decreasing BOC and improving NMR/CVA recovery. Pt did not meet LB dressing, toileting/transfer, shower transfer or standing balance goals- MAX A  Recommendation:  Patient will benefit from ongoing skilled OT services in skilled nursing facility setting to continue to advance functional skills in the area of BADL and Reduce care partner burden.  Equipment: No equipment provided  Reasons for discharge: treatment goals met and discharge from hospital  Patient/family agrees with progress made and goals achieved: Yes  OT Discharge Precautions/Restrictions  Precautions Precautions: Fall Precaution Comments: R hemi, R inattention, decr R sensation, small subluxation of R shoulder, fluctuating agitation/ temperament Restrictions Weight Bearing Restrictions: No General PT Missed Treatment Reason: Patient unwilling to participate;Increased agitation Vital Signs Therapy Vitals Temp: 97.7 F (36.5 C) Temp Source: Oral Pulse Rate: 67 Resp: 17 BP: (!) 126/93 Patient Position (if appropriate): Lying Oxygen Therapy SpO2: 98 % O2 Device: Room Air Pain Pain Assessment Pain Scale: 0-10 Pain Score: 0-No pain ADL ADL Eating: Supervision/safety Where Assessed-Eating: Bed level Grooming: Supervision/safety Where Assessed-Grooming: Edge of bed Upper Body Bathing:  Minimal assistance Where Assessed-Upper Body Bathing: Edge of bed Lower Body Bathing: Moderate assistance Where Assessed-Lower Body Bathing: Bed level Upper Body Dressing: Moderate assistance Where Assessed-Upper Body Dressing: Edge of bed Lower Body Dressing: Maximal assistance Where Assessed-Lower Body Dressing: Bed level Toileting: Maximal assistance Where Assessed-Toileting: Glass blower/designer: Other (comment), Dependent (+2 in stedy) Toilet Transfer Method: Other (comment) (stedy) Toilet Transfer Equipment: Raised toilet seat, Grab bars Tub/Shower Transfer: Not assessed Social research officer, government: Not assessed Vision Baseline Vision/History: 1 Wears glasses Patient Visual Report: Blurring of vision Vision Assessment?: Vision impaired- to be further tested in functional context Eye Alignment: Within Functional Limits Tracking/Visual Pursuits: Impaired - to be further tested in functional context Perception  Perception: Impaired Inattention/Neglect: Does not attend to right side of body;Does not attend to right visual field (improved since eval but mild inattention still present) Praxis Praxis: Impaired Praxis Impairment Details: Initiation;Motor planning Cognition Overall Cognitive Status: Impaired/Different from baseline Arousal/Alertness: Lethargic Orientation Level: Oriented to person;Oriented to situation Year: 2022 Month: September Day of Week: Correct Attention: Focused;Sustained Focused Attention: Impaired Sustained Attention: Impaired Memory: Impaired Immediate Memory Recall: Sock;Blue;Bed Memory Recall Sock: Not able to recall Memory Recall Blue: With Cue Memory Recall Bed: With Cue Problem Solving: Impaired Self Monitoring: Impaired Self Correcting: Impaired Behaviors: Verbal agitation;Poor frustration tolerance Safety/Judgment: Impaired Sensation Sensation Light Touch: Impaired by gross assessment Additional Comments: R hemi, improving sensation of  light touch to RUE/RLE Coordination Gross Motor Movements are Fluid and Coordinated: No Fine Motor Movements are Fluid and Coordinated: No Heel Shin Test: unable to complete 2/2 agitation and unwillingness to participate Motor  Motor Motor: Abnormal tone Motor - Discharge Observations: R hemipareisis with RUE more affected than RLE; increased extensor tone in RLE with paired extensor movements in trunk Mobility  Bed Mobility Bed Mobility: Rolling Right;Right Sidelying  to Sit;Supine to Sit;Sit to Supine  Trunk/Postural Assessment    Head forward, rounded shoudlers, R trunk shortening/L lengthening, post pelvic tilt Balance   Static sitting/dynamic sitting Supervision Static standing: MOD  Dynamic standing max-+2 Extremity/Trunk Assessment RUE Assessment RUE Body System: Neuro Brunstrum levels for arm and hand: Arm;Hand Brunstrum level for arm: Stage III Synergy is performed voluntarily Brunstrum level for hand: Stage III Synergies performed voluntarily LUE Assessment LUE Assessment: Within Functional Limits   Tonny Branch 07/14/2021, 4:23 PM

## 2021-07-14 NOTE — Progress Notes (Signed)
Occupational Therapy Session Note  Patient Details  Name: Carlos Welch MRN: [id number] Date of Birth: Sep 16, 1946 Today's Date: 07/14/2021 OT Individual Time: 1105-1200 OT Individual Time Calculation (min): 55 min    Pt received in bed with "some pain" in RUE and PROM provided for pain relief. ADL: Pt completes ADL at overall MOD A bed Level. Skilled interventions include: cuing for rolling min-CGA. Pt able to doff pants wth MIN A and don pants with MOD A. At bed level rolling as stated above and threading BLE in supported long sitting bringing BLE ot chest. Pt washed LB with MIN A rolling in B directions in bed. Pt sup>sit with MIN A for trunk elevation and VC for sequencing and rests with L elbow on bed for 30 seconds to improve midline orientation in prep for EOB ADL. Pt completes UB washing with HOH A of RUE to wash LUE/axilla. Pt doffs shirt with VC for R attention and dons with MOD A. Pt sit to stand at EOB with MOD A to power up and maintain balance d/t R pushing while new sheets applied. BP after standing 152/102. Returned to bed RN aware of elevated BP.   Therapeutic exercise 10 min of PROM of RUE to decrease risk of contracture and manage hypertonicity. Rhythmic movements  used to decrease tone as well as slow diaphramatic breathing.   Pt left at end of session in bed + towel roll in hand with exit alarm on, call light in reach and all needs met  Therapy/Group: Individual Therapy  Tonny Branch 07/14/2021, 4:22 PM

## 2021-07-14 NOTE — Progress Notes (Signed)
Physical Therapy Session Note  Patient Details  Name: Carlos Welch MRN: [id number] Date of Birth: 15-Aug-1946  Today's Date: 07/14/2021 PT Individual Time: 4403-4742 PT Individual Time Calculation (min): 25 min  and Today's Date: 07/14/2021 PT Missed Time: 35 Minutes Missed Time Reason: Patient unwilling to participate;Increased agitation  Short Term Goals: Week 1:  PT Short Term Goal 1 (Week 1): Pt will perform all bed mobility with consistent CGA. PT Short Term Goal 2 (Week 1): Pt will perform dynamic sitting balance tasks with Min A. PT Short Term Goal 3 (Week 1): Pt will perform sit <> stand with Mod A. PT Short Term Goal 4 (Week 1): Pt will initiate gait training with LRAD. PT Short Term Goal 5 (Week 1): Pt will decrease complaint of lightheadedness/ dizziness with mobility by 50%. Week 2:  PT Short Term Goal 1 (Week 2): Pt will perform all bed mobility with consistent CGA. PT Short Term Goal 1 - Progress (Week 2): Progressing toward goal PT Short Term Goal 2 (Week 2): Pt will perform sit <> stand with consistent Min A PT Short Term Goal 2 - Progress (Week 2): Partly met PT Short Term Goal 3 (Week 2): Pt will perform funcitonal seat to seat transfers with Min A. PT Short Term Goal 3 - Progress (Week 2): Not met PT Short Term Goal 4 (Week 2): Pt will ambulate at least 50 feet with consistent Mod A. PT Short Term Goal 4 - Progress (Week 2): Progressing toward goal PT Short Term Goal 5 (Week 2): Pt will perform dynamic sitting balance tasks with CGA. PT Short Term Goal 5 - Progress (Week 2): Met Week 3:  PT Short Term Goal 1 (Week 3): STG = LTG d/t ELOS  Skilled Therapeutic Interventions/Progress Updates:   Pt received supine in bed, initially pleasant mood and denied pain. Noted pt's face and shirt was covered in food from breakfast, attempted to wash pt's face and change shirt in bed. Donned Ted hose w/total A, pt became agitated regarding his toenails, asking therapist to cut them.  Informed pt we did not clippers in CIR, pt responded "this place is bullsh*t". While donning ted hose, pt began to yell at therapist and tech to "shut the f*ck up" and "do your job". Attempted to verbally de-escalate pt, who continued to yell at therapist and tech to "shut up" and "do what we needed to do and leave". Pt verbalized that he did not care who therapist was, did not respect therapist and therapist was "just a woman". Informed pt he was being disrespectful and inappropriate, pt responded that he "did not give a f*ck so shut up."  Informed pt he could not speak to staff so disrespectfully, missed 35 minutes of skilled PT due to pt agitation/unwillingness to participate.   Therapy Documentation Precautions:  Precautions Precautions: Fall Precaution Comments: R hemi, R inattention, decr R sensation, small subluxation of R shoulder, fluctuating agitation, hypertension, lightheadedness/ blurry vision/ nausea with change in position Restrictions Weight Bearing Restrictions: No   Therapy/Group: Individual Therapy Cruzita Lederer Dvid Pendry, PT, DPT  07/14/2021, 7:51 AM

## 2021-07-14 NOTE — Progress Notes (Signed)
Speech Language Pathology Discharge Summary  Patient Details  Name: Carlos Welch MRN: [id number] Date of Birth: 12-04-1945  Today's Date: 07/14/2021 SLP Individual Time: 1300-1345 SLP Individual Time Calculation (min): 45 min  Skilled Therapeutic Interventions: Pt received in bed and agreeable to skilled ST intervention with focus on swallowing and cognitive goals. Pt oriented x4 with min A verbal/visual cues with use of external aids. SLP assessed consumption of Dys 3 trial tray and thin liquids with noon meal with sup A verbal cues for implementation of swallow compensatory strategies. Pt consumed Dys 3 textures with timely and effective mastication, adequate oral clearance, minimal anterior labial spillage on right, mild pocketing in right buccal cavity. No overt s/sx of aspiration with solids. Pt displayed intermittent implementation of lingual sweep at independent level and required finger sweep from therapist to clear buccal pocketing completely due to minimal awareness. He also consumed single and sequential sips of thin liquid by cup with no overt s/sx of aspiration. Pt displayed appropriate rate of consumption and bolus volume with active awareness of swallow safety throughout meal. He stated "I'm really taking my time" and "I need to chew it good" which was evident to therapist. Recommend diet advancement to Dys 3 and thin liquids with intermittent sup for implementation of swallow compensatory strategies. Also assessed tolerance of PO medications given whole in applesauce which pt tolerated with timely oral clearance and no pharyngeal symptoms or complaints. Proceed with meds whole in applesauce. Pt educated on diet advancement and SLP reinforced education of safe swallow precautions in which pt was able to teach back with sup A verbal cues for accuracy. Patient was left in bed with alarm activated and immediate needs within reach at end of session.    Patient has met 6 of 6 long term goals.   Patient to discharge at overall Supervision;Min level.  Reasons goals not met: NA   Clinical Impression/Discharge Summary:  Patient has made moderate gains in cognition and excellent gain in oropharyngeal swallow function. Pt met 6 of 6 long-term goals this admission due to improved cooperation and participation in therapy and cognitive-communication skills including attention, orientation with use of external aids, and basic problem solving skills. Improved oropharyngeal swallow can be further evidenced by repeat MBS on 07/13/21 revealing a mild oropharyngeal dysphagia with no aspiration events under fluoro. Pt also tolerated Dys 3 trial tray and was appropriate for diet advancement to Dys 3 textures with thin liquids as of 07/14/21. Currently taking medications whole in puree. Pt currently requires intermittent supervision assist for implementation of swallow compensatory strategies and for monitoring anterior labial spillage and clearing mild pocketing in right buccal cavity due to impaired sensation. Patient is currently an overall min for cognitive tasks when cooperative. Patient can be self limiting and level of assist appears to fluctuate with patient's mood and if he is experiencing any discomfort or internal distractions. Pt's frustration tolerance and verbal agitation continues to be limiting at times. Patient and family education is complete and patient to discharge at overall sup-to-min A level. Patient would benefit from continued SLP services at SNF level to maximize cognitive-communication skills, oropharyngeal swallow, and functional independence.    Caregiver Able to Provide Assistance: Yes (discharging to SNF)   Recommendation:  Skilled Nursing facility;Home Health SLP  Rationale for SLP Follow Up: Maximize functional communication;Maximize cognitive function and independence;Maximize swallowing safety   Equipment: NA   Reasons for discharge: Treatment goals met;Discharged from hospital    Patient/Family Agrees with Progress Made and Goals  Achieved: Yes    Patty Sermons 07/14/2021, 5:08 PM

## 2021-07-14 NOTE — Progress Notes (Signed)
Patient ID: Carlos Welch, male   DOB: [birth year], 75 y.o.   MRN: [id number]  Pt transport scheduled for 11 AM on tomorrow.  [place], [place] [phone]

## 2021-07-14 NOTE — Plan of Care (Signed)
Patient discharging to SNF; incontinent of bowel and bladder despite timed toileting and prompting.

## 2021-07-14 NOTE — Progress Notes (Signed)
Patient ID: Carlos Welch, male   DOB: 1946/06/25, 75 y.o.   MRN: [id number]  Sw spoke with pt. Pt is in agreement to SNF placement. Would like to receive additional therapies to get strong enough to return home with daughter. Pt daughter will return to town on tomorrow. Planning for SNF transfer on tomorrow.   [place], [place] [phone]

## 2021-07-14 NOTE — Progress Notes (Signed)
Physical Therapy Discharge Summary  Patient Details  Name: Carlos Welch MRN: [id number] Date of Birth: 26-Jul-1946  Today's Date: 07/14/2021 PT Individual Time: 0215-0230 PT Individual Time Calculation (min): 15 min    Patient has met 8 of 9 long term goals due to improved activity tolerance, improved balance, improved postural control, increased strength, increased range of motion, decreased pain, ability to compensate for deficits, functional use of  right upper extremity and right lower extremity, improved awareness, and improved coordination.  Patient to discharge at a Min/ Mod A ambulatory level and a wheelchair level [place].   Patient's care partner unavailable to provide the necessary physical and cognitive assistance at discharge so pt's discharge plans have changed from returning home on d/c to requiring further therapy at SNF prior to return home.  Reasons goals not met: One goal deemed appropriate for d/c d/t pt's change of d/c location to local SNF for further therapies to progress LOA required and overall   Recommendation:  Patient will benefit from ongoing skilled PT services in skilled nursing facility setting to continue to advance safe functional mobility, address ongoing impairments in strength, coordination, balance, activity tolerance, cognition, safety awareness, and to  minimize fall risk.  Equipment: All necessary equipment to be provided at SNF  Reasons for discharge: treatment goals met and discharge from hospital  Patient/family agrees with progress made and goals achieved: Yes  PT Discharge Precautions/Restrictions Precautions Precautions: Fall Precaution Comments: R hemi, R inattention, decr R sensation, small subluxation of R shoulder, fluctuating agitation/ temperament Restrictions Weight Bearing Restrictions: No Vital Signs Therapy Vitals Temp: 97.7 F (36.5 C) Temp Source: Oral Pulse Rate: 67 Resp: 17 BP: (!) 126/93 Patient Position (if  appropriate): Lying Oxygen Therapy SpO2: 98 % O2 Device: Room Air Pain Pain Assessment Pain Scale: 0-10 Pain Score: 0-No pain Pain Interference Pain Interference Pain Effect on Sleep: 1. Rarely or not at all Pain Interference with Therapy Activities: 2. Occasionally Pain Interference with Day-to-Day Activities: 2. Occasionally Vision/Perception  Vision - History Ability to See in Adequate Light: 2 Moderately impaired Vision - Assessment Eye Alignment: Within Functional Limits Tracking/Visual Pursuits: Impaired - to be further tested in functional context Perception Perception: Impaired Inattention/Neglect: Does not attend to right side of body;Does not attend to right visual field (improved since eval but mild inattention still present) Praxis Praxis: Impaired Praxis Impairment Details: Initiation;Motor planning  Cognition Overall Cognitive Status: Impaired/Different from baseline Arousal/Alertness: Awake/alert Orientation Level: Oriented to person;Oriented to situation Attention: Focused;Sustained Focused Attention: Impaired Focused Attention Impairment: Verbal basic;Functional basic Sustained Attention: Impaired Sustained Attention Impairment: Verbal basic;Functional basic Memory: Impaired Memory Impairment: Decreased recall of new information Awareness: Impaired Awareness Impairment: Intellectual impairment;Emergent impairment Problem Solving: Impaired Executive Function: Self Monitoring;Self Correcting Self Monitoring: Impaired Self Correcting: Impaired Behaviors: Verbal agitation;Poor frustration tolerance Safety/Judgment: Impaired Sensation Sensation Light Touch: Impaired by gross assessment Additional Comments: R hemi, improving sensation of light touch to RUE/RLE Coordination Gross Motor Movements are Fluid and Coordinated: No Fine Motor Movements are Fluid and Coordinated: No Heel Shin Test: unable to complete 2/2 agitation and unwillingness to  participate Motor  Motor Motor: Abnormal tone Motor - Discharge Observations: R hemipareisis with RUE more affected than RLE; increased extensor tone in RLE with paired extensor movements in trunk  Mobility Bed Mobility Bed Mobility: Rolling Right;Right Sidelying to Sit;Supine to Sit;Sit to Supine Rolling Right: Contact Guard/Touching assist;Supervision/verbal cueing Right Sidelying to Sit: Contact Guard/Touching assist;Minimal Assistance - Patient > 75% Supine to Sit: Contact Guard/Touching assist;Minimal  Assistance - Patient > 75% Sit to Supine: Contact Guard/Touching assist;Supervision/Verbal cueing Transfers Transfers: Sit to Stand;Stand to Sit;Transfer via Lift Equipment;Stand Pivot Transfers;Squat Pivot Transfers Sit to Stand: Minimal Assistance - Patient > 75% Stand to Sit: Minimal Assistance - Patient > 75% Stand Pivot Transfers: Moderate Assistance - Patient 50 - 74% Stand Pivot Transfer Details: Tactile cues for sequencing;Verbal cues for sequencing;Verbal cues for technique;Verbal cues for precautions/safety;Verbal cues for gait pattern;Verbal cues for safe use of DME/AE Squat Pivot Transfers: Moderate Assistance - Patient 50-74%;Minimal Assistance - Patient > 75% (also used bed rail or w/c armrest) Transfer (Assistive device): Rolling walker Locomotion  Gait Ambulation: Yes Gait Assistance: Moderate Assistance - Patient 50-74%;Maximal Assistance - Patient 25-49% Gait Distance (Feet): 38 Feet Gait Assistance Details: Tactile cues for weight shifting;Tactile cues for sequencing;Verbal cues for sequencing;Verbal cues for technique;Verbal cues for precautions/safety;Verbal cues for gait pattern;Verbal cues for safe use of DME/AE Gait Gait: Yes Gait Pattern: Impaired Gait Pattern: Decreased hip/knee flexion - right;Decreased weight shift to right;Decreased stride length;Trunk flexed;Narrow base of support;Poor foot clearance - left;Poor foot clearance - right Gait velocity:  decreased Stairs / Additional Locomotion Stairs: No Wheelchair Mobility Wheelchair Mobility: Yes Wheelchair Assistance: Minimal assistance - Patient >75% Wheelchair Propulsion: Left upper extremity;Left lower extremity Wheelchair Parts Management: Needs assistance  Trunk/Postural Assessment  Cervical Assessment Cervical Assessment: Exceptions to [facility] (severe forward head) Thoracic Assessment Thoracic Assessment: Exceptions to [facility] (kyphotic posture) Lumbar Assessment Lumbar Assessment: Exceptions to [facility] (post pelvic tilt) Postural Control Postural Control: Deficits on evaluation (trunk motor control improved from eval but continues to demo difficulty with initiation of forward lean)  Balance Balance Balance Assessed: Yes Static Sitting Balance Static Sitting - Balance Support: Feet supported;Bilateral upper extremity supported Static Sitting - Level of Assistance: 5: Stand by assistance Static Sitting - Comment/# of Minutes: supervision Dynamic Sitting Balance Dynamic Sitting - Balance Support: During functional activity;Right upper extremity supported Dynamic Sitting - Level of Assistance: 5: Stand by assistance Dynamic Sitting - Balance Activities: Reaching across midline;Forward lean/weight shifting;Lateral lean/weight shifting;Reaching for weighted objects;Reaching for objects Sitting balance - Comments: decreased LOB in sitting with functional acivites Static Standing Balance Static Standing - Balance Support: During functional activity;Left upper extremity supported Static Standing - Level of Assistance: 4: Min assist;5: Stand by assistance Dynamic Standing Balance Dynamic Standing - Balance Support: Bilateral upper extremity supported Dynamic Standing - Level of Assistance: 4: Min assist;5: Stand by assistance Dynamic Standing - Balance Activities: Forward lean/weight shifting;Reaching for objects;Reaching for weighted objects;Reaching across midline Extremity Assessment       RLE Assessment RLE Assessment: Not tested General Strength Comments: No formal MMT but functionally at 4-/ 5 grossly LLE Assessment LLE Assessment: Not tested General Strength Comments: No formal MMT but functionally at 4/ 5 grossly  Session/ Observations: Pt does tend to act out when frustrated. His agitation has usually been connected to an internal discomfort that he cannot pinpoint d/t thalamic ICH/ stroke and uses negative verbal outbursts to express. For example, agitation increased with verbal fits and accusatory comments when pt was constipated. Pt does have intermittent increases in extensor tone that make it difficult to have BM especially while supine on bedpans. Use flexed/ seated positions as much as possible for appropriate toileting.  Pt also becomes frustrated with realizations that he cannot move his R side as he used to and can have moments of emotional lability followed by frustration and agitation.   Pt has also used comments of generalized pain in attempts to discourage  therapy sessions or end therapy sessions early. When reminded of past participation and reduction in pain, pt may participate with improved temperament.  This session, pt relates that he is moving to SNF tomorrow. On encouragement to participate in therapy, pt relates that there is not need as he needs to rest for his move tomorrow. Discussion and education provided re: move to SNF for continued therapies in order to continue to improve his overall mobility as, ultimately, pt desires to return home. Reminded that d/t his ranging temperament, participation in therapy has fluctuated here and his progress has not been as expected requiring further time in another facility with more therapy. Pt reminded to attempt to participate as often as possible as that therapy will not last forever as this level of care has not. He will need to take advantage of the time he has there in short term rehab in order to continue to  progress and improve overall mobility to return home. Reminded that others are not there to move for him - he is there to relearn how to move for himself. So demands to staff to move him, may not be accepted.   Pt remains in supine with all needs in reach and bed alarm active.   Alger Simons 07/14/2021, 5:29 PM

## 2021-07-14 NOTE — Progress Notes (Signed)
PROGRESS NOTE   Subjective/Complaints:  TOlerated thin liquids no coughing this am but has not had breakfast  Shoulder pain improved today   ROS: Denies CP, SOB, N/V/D  Objective:   DG Swallowing Func-Speech Pathology  Result Date: 07/13/2021 Table formatting from the original result was not included. Objective Swallowing Evaluation: Type of Study: MBS-Modified Barium Swallow Study  Patient Details Name: Carlos Welch MRN: [id number] Date of Birth: 29-Jan-1946 Today's Date: 07/13/2021 Time: SLP Start Time (ACUTE ONLY): 0820 -SLP Stop Time (ACUTE ONLY): 0835 SLP Time Calculation (min) (ACUTE ONLY): 15 min Past Medical History: Past Medical History: Diagnosis Date  A-fib (HCC)   Abscess of jaw 07/19/2020  Acute on chronic heart failure with preserved ejection fraction (HCC) 07/08/2020  Aortic stenosis   a. s/p pericardial AVR 2015.  Aortic valve stenosis 07/19/2020  Bacteremia 07/19/2020  Bacterial endocarditis   BPH (benign prostatic hyperplasia) 07/08/2020  CAD (coronary artery disease)   a. s/p CABG, AVR, LAA clipping 2015 at [facility].  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/08/2020  Elevated troponin 07/08/2020  Essential hypertension 07/08/2020  Generalized abdominal pain   History of noncompliance with medical treatment   Hyperlipemia 07/08/2020  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  Malnutrition (HCC)   MRSA bacteremia   Nonrheumatic mitral valve regurgitation 07/19/2020  Normocytic anemia 07/08/2020  Pressure injury of skin 05/12/2020  S/P aortic valve replacement with bioprosthetic  valve 2015  Severe malnutrition (HCC) 07/19/2020  Thrombocytopenia (HCC) 07/08/2020  TIA (transient ischemic attack)   Weight loss 07/19/2020 Past Surgical History: Past Surgical History: Procedure Laterality Date  AORTIC VALVE REPLACEMENT    CORONARY ARTERY BYPASS GRAFT  2015  HERNIA REPAIR    REMOVAL OF IMPLANT  03/14/2020  TEE WITHOUT CARDIOVERSION N/A 05/13/2020  Procedure: TRANSESOPHAGEAL ECHOCARDIOGRAM (TEE);  Surgeon: Lewayne Bunting, MD;  Location: [facility] ENDOSCOPY;  Service: Cardiovascular;  Laterality: N/A; HPI: 75 y.o. male with PMH: aFib, aortic stenosis, CAD, bilateral lower extremity cellulitis, combined CHF, HTN, HLD, thrombocytopenia, presented toEd via EMS for evaluation of abrupt onset of right-sided weakness and numbness. LVO positive code stroke was activated. Noncontrasted head CT showed left thalamic bleed around 9 cc without IVH.  Subjective: Pt awake and alert but having trouble keeping eyes open. Assessment / Plan / Recommendation CHL IP CLINICAL IMPRESSIONS 07/13/2021 Clinical Impression Patient presents with an improved swallow function as compared to most recent on 9/9. During oral phase, he exhibited mild delays in anterior to posterior transit of puree and mechanical soft solids and a few instances of trace oral residual s/p initial swallow. With thin liquids and nectar thick liquids taken via cup sips, patient did not exhibit adequate oral containment, leading to premature spillage into vallecular sinus. Swallow was delayed to level of vallecular sinus with solids and nectar thick liquids and was delayed to level of pyriform sinus with thin liquids. Only trace  vallecular residuals observed with nectar thick liquids, which cleared fully with subsequent swallows. Patient exhibited consistent flash penetration above vocal cordsPAS 2) and appearing to contact vocal cords (PAS 4) before complete clearance from laryngeal vestibule. No instances of aspiration observed even with large cup sips or  straw sips. SLP is recommending to advance patient's diet to thin liquids and continue with Dys 2 solids. SLP Visit Diagnosis Dysphagia, oropharyngeal phase (R13.12) Attention and concentration deficit following -- Frontal lobe and executive function deficit following -- Impact on safety and function Mild aspiration risk;Moderate aspiration risk   CHL IP TREATMENT RECOMMENDATION 06/24/2021 Treatment Recommendations Therapy as outlined in treatment plan below   Prognosis 07/13/2021 Prognosis for Safe Diet Advancement Good Barriers to Reach Goals -- Barriers/Prognosis Comment -- CHL IP DIET RECOMMENDATION 07/13/2021 SLP Diet Recommendations Dysphagia 2 (Fine chop) solids;Thin liquid Liquid Administration via Cup;Straw Medication Administration Whole meds with puree Compensations Lingual sweep for clearance of pocketing;Small sips/bites;Slow rate Postural Changes Seated upright at 90 degrees   CHL IP OTHER RECOMMENDATIONS 07/13/2021 Recommended Consults -- Oral Care Recommendations Oral care BID;Staff/trained caregiver to provide oral care Other Recommendations --   CHL IP FOLLOW UP RECOMMENDATIONS 06/24/2021 Follow up Recommendations Inpatient Rehab   CHL IP FREQUENCY AND DURATION 06/24/2021 Speech Therapy Frequency (ACUTE ONLY) min 2x/week Treatment Duration 2 weeks      CHL IP ORAL PHASE 07/13/2021 Oral Phase Impaired Oral - Pudding Teaspoon -- Oral - Pudding Cup -- Oral - Honey Teaspoon -- Oral - Honey Cup -- Oral - Nectar Teaspoon -- Oral - Nectar Cup Premature spillage Oral - Nectar Straw NT Oral - Thin Teaspoon -- Oral - Thin Cup Premature spillage Oral - Thin Straw WFL Oral - Puree Delayed oral transit Oral - Mech Soft Delayed oral transit Oral - Regular -- Oral - Multi-Consistency -- Oral - Pill WFL Oral Phase - Comment --  CHL IP PHARYNGEAL PHASE 07/13/2021 Pharyngeal Phase Impaired Pharyngeal- Pudding Teaspoon -- Pharyngeal -- Pharyngeal- Pudding Cup -- Pharyngeal -- Pharyngeal- Honey Teaspoon -- Pharyngeal --  Pharyngeal- Honey Cup -- Pharyngeal -- Pharyngeal- Nectar Teaspoon -- Pharyngeal -- Pharyngeal- Nectar Cup Delayed swallow initiation-vallecula;Pharyngeal residue - valleculae Pharyngeal -- Pharyngeal- Nectar Straw NT Pharyngeal -- Pharyngeal- Thin Teaspoon -- Pharyngeal -- Pharyngeal- Thin Cup Delayed swallow initiation-pyriform sinuses;Penetration/Aspiration during swallow Pharyngeal Material enters airway, CONTACTS cords and then ejected out;Material enters airway, remains ABOVE vocal cords then ejected out Pharyngeal- Thin Straw Delayed swallow initiation-pyriform sinuses;Penetration/Aspiration during swallow Pharyngeal Material enters airway, CONTACTS cords and then ejected out;Material enters airway, remains ABOVE vocal cords then ejected out Pharyngeal- Puree Delayed swallow initiation-vallecula Pharyngeal -- Pharyngeal- Mechanical Soft Delayed swallow initiation-vallecula Pharyngeal -- Pharyngeal- Regular -- Pharyngeal -- Pharyngeal- Multi-consistency -- Pharyngeal -- Pharyngeal- Pill WFL Pharyngeal -- Pharyngeal Comment --  CHL IP CERVICAL ESOPHAGEAL PHASE 07/13/2021 Cervical Esophageal Phase WFL Pudding Teaspoon -- Pudding Cup -- Honey Teaspoon -- Honey Cup -- Nectar Teaspoon -- Nectar Cup -- Nectar Straw -- Thin Teaspoon -- Thin Cup -- Thin Straw -- Puree -- Mechanical Soft -- Regular -- Multi-consistency -- Pill -- Cervical Esophageal Comment -- Angela Nevin, MA, CCC-SLP Speech Therapy             Recent Labs    07/11/21 1322  WBC 6.2  HGB 16.0  HCT 48.1  PLT 195    No results for input(s): NA, K, CL, CO2, GLUCOSE, BUN, CREATININE, CALCIUM in the last 72 hours.   Intake/Output Summary (Last 24 hours) at 07/14/2021 0739 Last data  filed at 07/14/2021 0510 Gross per 24 hour  Intake 649 ml  Output 1100 ml  Net -451 ml          Physical Exam: Vital Signs Blood pressure (!) 149/104, pulse (!) 56, temperature 98.4 F (36.9 C), temperature source Oral, resp. rate 16, height 6\' 2"   (1.88 m), weight 75.5 kg, SpO2 98 %.   General: No acute distress Mood and affect are appropriate Heart: Regular rate and rhythm no rubs murmurs or extra sounds Lungs: Clear to auscultation, breathing unlabored, no rales or wheezes Abdomen: Positive bowel sounds, soft nontender to palpation, nondistended Extremities: No clubbing, cyanosis, or edema Skin: No evidence of breakdown, no evidence of rash  MSK- pain with RIght shoulder flex abd and ext rotation, no swelling or erythema, no hypersensitivity  Neuro: Alert Motor: 5/5 in left deltoid, bicep, tricep, grip, hip flexor, knee extensors, ankle dorsiflexor and plantar flexor Sensation- absent to LT in RIght hand  RLE 3- hip knee ext synergy , 3- Right ankle PF  Assessment/Plan: 1. Functional deficits which require 3+ hours per day of interdisciplinary therapy in a comprehensive inpatient rehab setting. Physiatrist is providing close team supervision and 24 hour management of active medical problems listed below. Physiatrist and rehab team continue to assess barriers to discharge/monitor patient progress toward functional and medical goals  Care Tool:  Bathing    Body parts bathed by patient: Right arm, Chest, Abdomen, Right upper leg, Left upper leg, Face   Body parts bathed by helper: Right arm, Left arm, Buttocks, Right lower leg, Left lower leg Body parts n/a: Front perineal area   Bathing assist Assist Level: Moderate Assistance - Patient 50 - 74%     Upper Body Dressing/Undressing Upper body dressing   What is the patient wearing?: Pull over shirt    Upper body assist Assist Level: Maximal Assistance - Patient 25 - 49%    Lower Body Dressing/Undressing Lower body dressing      What is the patient wearing?: Pants     Lower body assist Assist for lower body dressing: Moderate Assistance - Patient 50 - 74%     Toileting Toileting    Toileting assist Assist for toileting: 2 Helpers     Transfers Chair/bed  transfer  Transfers assist  Chair/bed transfer activity did not occur: Safety/medical concerns  Chair/bed transfer assist level: 2 Helpers     Locomotion Ambulation   Ambulation assist   Ambulation activity did not occur: Safety/medical concerns  Assist level: Moderate Assistance - Patient 50 - 74% Assistive device: Other (comment) (hallway handrail) Max distance: 38 ft   Walk 10 feet activity   Assist  Walk 10 feet activity did not occur: Safety/medical concerns  Assist level: Moderate Assistance - Patient - 50 - 74% Assistive device: Other (comment) (L hallway handrail)   Walk 50 feet activity   Assist Walk 50 feet with 2 turns activity did not occur: Safety/medical concerns         Walk 150 feet activity   Assist Walk 150 feet activity did not occur: Safety/medical concerns         Walk 10 feet on uneven surface  activity   Assist Walk 10 feet on uneven surfaces activity did not occur: Safety/medical concerns         Wheelchair     Assist Is the patient using a wheelchair?: Yes (Per PT long term goals) Type of Wheelchair: Manual Wheelchair activity did not occur: Safety/medical concerns  Wheelchair 50 feet with 2 turns activity    Assist    Wheelchair 50 feet with 2 turns activity did not occur: Safety/medical concerns       Wheelchair 150 feet activity     Assist  Wheelchair 150 feet activity did not occur: Safety/medical concerns       Blood pressure (!) 149/104, pulse (!) 56, temperature 98.4 F (36.9 C), temperature source Oral, resp. rate 16, height 6\' 2"  (1.88 m), weight 75.5 kg, SpO2 98 %.  Medical Problem List and Plan: 1.  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  Antithrombotics: -DVT/anticoagulation:  Mechanical: Sequential compression devices, below knee Bilateral lower extremities              -antiplatelet therapy: N/A 3. Pain: continue Neurontin 100 mg nightly, hydrocodone as needed  Controlled on 9/29 4. Mood: Provide emotional support             -antipsychotic agents: N/A 5. Neuropsych: This patient is capable of making decisions on his own behalf. 6. Skin/Wound Care: Routine skin checks- added lac hydrin  7. Fluids/Electrolytes/Nutrition: Routine in and outs Continue megestrol  8.  Post stroke dysphagia.  Dysphagia #2 nectar thick liquids.  Follow-up speech therapy, poor intake due to lethargy now on appetite stimulant and neurostimulant   Advance diet as tolerated 9.  Hypertension.  Hydralazine 50 mg every 8 hours, Cozaar 100 mg daily.  Monitor with increased mobility Vitals:   07/13/21 1916 07/14/21 0437  BP: 133/77 (!) 149/104  Pulse: 61 (!) 56  Resp: 18 16  Temp: 97.7 F (36.5 C) 98.4 F (36.9 C)  SpO2: 98% 98%  Lasix, hydralazine, Cozaar decreased elevated BP this am will monitor prior to any dosage changes   10.  Combined congestive heart failure.  Lasix decreased from 40mg  to 20mg   Filed Weights   07/05/21 0331 07/06/21 0456 07/09/21 0318  Weight: 77.2 kg 77 kg 75.5 kg   11.  Chronic bilateral lower extremity edema/cellulitis.  Continue Lasix.  Complete course of doxycycline 12.  Atrial fibrillation.  Eliquis held due to ICH.  Cardiac rate controlled 13.  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  Follow-up cardiology services 14.  Hyperlipidemia.  Currently holding statin due to ICH 15.  Chronic thrombocytopenia.    Normal at 195K on 9/26 16.  Lethargy: Multifactorial  Repeat CT showed no new bleed or infarct, CBC normal and afebrile to doubt infection, Thalamic strokes often cause problems with arousal Methylphenidate initiated improved initiation and participation some increased anxiety will reduce to 5mg   Increase methylphenidate to 10mg  BID 17.  Urinary retention, was on I/O caths but changed to foley after  hematuria, urine now clear will do another voiding trial , resume flomax - 9/28 voiding incont 18.  RIght shoulder pain, early /mild frozen shoulder cont ROM LOS: 17 days A FACE TO FACE EVALUATION WAS PERFORMED  07/11/21 07/14/2021, 7:39 AM

## 2021-07-14 NOTE — Progress Notes (Signed)
Inpatient Rehabilitation Care Coordinator Discharge Note   Patient Details  Name: Carlos Welch MRN: [id number] Date of Birth: [birth year]   Discharge location: SNF  Length of Stay: 18 Days  Discharge activity level: Mod/Max  Home/community participation: Daughter and grandson able to assist at home upon d/c of SNF  Patient response IW:OEHOZY Literacy - How often do you need to have someone help you when you read instructions, pamphlets, or other written material from your doctor or pharmacy?: Often  Patient response YQ:MGNOIB Isolation - How often do you feel lonely or isolated from those around you?: Never  Services provided included: MD, RD, PT, OT, SLP, RN, CM, TR, Pharmacy, Neuropsych, SW  Financial Services:  Field seismologist Utilized: Medicare    Choices offered to/list presented to: Patient daughter Carlos Welch)  Follow-up services arranged:              Patient response to transportation need: Is the patient able to respond to transportation needs?: Yes In the past 12 months, has lack of transportation kept you from medical appointments or from getting medications?: No In the past 12 months, has lack of transportation kept you from meetings, work, or from getting things needed for daily living?: No    Comments (or additional information):  Patient/Family verbalized understanding of follow-up arrangements:  No  Individual responsible for coordination of the follow-up plan: Carlos Welch [phone]  Confirmed correct DME delivered: Carlos Welch 07/14/2021    Carlos Welch

## 2021-07-15 NOTE — Progress Notes (Signed)
PROGRESS NOTE   Subjective/Complaints:  Scheduled for SNF transfer today , needs to have BM this am , no abd pain  ROS: Denies CP, SOB, N/V/D  Objective:   DG Swallowing Func-Speech Pathology  Result Date: 07/13/2021 Table formatting from the original result was not included. Objective Swallowing Evaluation: Type of Study: MBS-Modified Barium Swallow Study  Patient Details Name: Carlos Welch MRN: [id number] Date of Birth: 1946-07-28 Today's Date: 07/13/2021 Time: SLP Start Time (ACUTE ONLY): 0820 -SLP Stop Time (ACUTE ONLY): 0835 SLP Time Calculation (min) (ACUTE ONLY): 15 min Past Medical History: Past Medical History: Diagnosis Date  A-fib (HCC)   Abscess of jaw 07/19/2020  Acute on chronic heart failure with preserved ejection fraction (HCC) 07/08/2020  Aortic stenosis   a. s/p pericardial AVR 2015.  Aortic valve stenosis 07/19/2020  Bacteremia 07/19/2020  Bacterial endocarditis   BPH (benign prostatic hyperplasia) 07/08/2020  CAD (coronary artery disease)   a. s/p CABG, AVR, LAA clipping 2015 at [facility].  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/08/2020  Elevated troponin 07/08/2020  Essential hypertension 07/08/2020  Generalized abdominal pain   History of noncompliance with medical treatment   Hyperlipemia 07/08/2020  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  Malnutrition (HCC)   MRSA bacteremia   Nonrheumatic mitral valve regurgitation 07/19/2020  Normocytic anemia 07/08/2020  Pressure injury of skin 05/12/2020  S/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 2015  Severe  malnutrition (HCC) 07/19/2020  Thrombocytopenia (HCC) 07/08/2020  TIA (transient ischemic attack)   Weight loss 07/19/2020 Past Surgical History: Past Surgical History: Procedure Laterality Date  AORTIC VALVE REPLACEMENT    CORONARY ARTERY BYPASS GRAFT  2015  HERNIA REPAIR    REMOVAL OF IMPLANT  03/14/2020  TEE WITHOUT CARDIOVERSION N/A 05/13/2020  Procedure: TRANSESOPHAGEAL ECHOCARDIOGRAM (TEE);  Surgeon: Lewayne Bunting, MD;  Location: [facility] ENDOSCOPY;  Service: Cardiovascular;  Laterality: N/A; HPI: 75 y.o. male with PMH: aFib, aortic stenosis, CAD, bilateral lower extremity cellulitis, combined CHF, HTN, HLD, thrombocytopenia, presented toEd via EMS for evaluation of abrupt onset of right-sided weakness and numbness. LVO positive code stroke was activated. Noncontrasted head CT showed left thalamic bleed around 9 cc without IVH.  Subjective: Pt awake and alert but having trouble keeping eyes open. Assessment / Plan / Recommendation CHL IP CLINICAL IMPRESSIONS 07/13/2021 Clinical Impression Patient presents with an improved swallow function as compared to most recent on 9/9. During oral phase, he exhibited mild delays in anterior to posterior transit of puree and mechanical soft solids and a few instances of trace oral residual s/p initial swallow. With thin liquids and nectar thick liquids taken via cup sips, patient did not exhibit adequate oral containment, leading to premature spillage into vallecular sinus. Swallow was delayed to level of vallecular sinus with solids and nectar thick liquids and was delayed to level of pyriform sinus with thin liquids. Only trace vallecular residuals  observed with nectar thick liquids, which cleared fully with subsequent swallows. Patient exhibited consistent flash penetration above vocal cordsPAS 2) and appearing to contact vocal cords (PAS 4) before complete clearance from laryngeal vestibule. No instances of aspiration observed even with large cup sips or straw sips. SLP is  recommending to advance patient's diet to thin liquids and continue with Dys 2 solids. SLP Visit Diagnosis Dysphagia, oropharyngeal phase (R13.12) Attention and concentration deficit following -- Frontal lobe and executive function deficit following -- Impact on safety and function Mild aspiration risk;Moderate aspiration risk   CHL IP TREATMENT RECOMMENDATION 06/24/2021 Treatment Recommendations Therapy as outlined in treatment plan below   Prognosis 07/13/2021 Prognosis for Safe Diet Advancement Good Barriers to Reach Goals -- Barriers/Prognosis Comment -- CHL IP DIET RECOMMENDATION 07/13/2021 SLP Diet Recommendations Dysphagia 2 (Fine chop) solids;Thin liquid Liquid Administration via Cup;Straw Medication Administration Whole meds with puree Compensations Lingual sweep for clearance of pocketing;Small sips/bites;Slow rate Postural Changes Seated upright at 90 degrees   CHL IP OTHER RECOMMENDATIONS 07/13/2021 Recommended Consults -- Oral Care Recommendations Oral care BID;Staff/trained caregiver to provide oral care Other Recommendations --   CHL IP FOLLOW UP RECOMMENDATIONS 06/24/2021 Follow up Recommendations Inpatient Rehab   CHL IP FREQUENCY AND DURATION 06/24/2021 Speech Therapy Frequency (ACUTE ONLY) min 2x/week Treatment Duration 2 weeks      CHL IP ORAL PHASE 07/13/2021 Oral Phase Impaired Oral - Pudding Teaspoon -- Oral - Pudding Cup -- Oral - Honey Teaspoon -- Oral - Honey Cup -- Oral - Nectar Teaspoon -- Oral - Nectar Cup Premature spillage Oral - Nectar Straw NT Oral - Thin Teaspoon -- Oral - Thin Cup Premature spillage Oral - Thin Straw WFL Oral - Puree Delayed oral transit Oral - Mech Soft Delayed oral transit Oral - Regular -- Oral - Multi-Consistency -- Oral - Pill WFL Oral Phase - Comment --  CHL IP PHARYNGEAL PHASE 07/13/2021 Pharyngeal Phase Impaired Pharyngeal- Pudding Teaspoon -- Pharyngeal -- Pharyngeal- Pudding Cup -- Pharyngeal -- Pharyngeal- Honey Teaspoon -- Pharyngeal -- Pharyngeal- Honey Cup --  Pharyngeal -- Pharyngeal- Nectar Teaspoon -- Pharyngeal -- Pharyngeal- Nectar Cup Delayed swallow initiation-vallecula;Pharyngeal residue - valleculae Pharyngeal -- Pharyngeal- Nectar Straw NT Pharyngeal -- Pharyngeal- Thin Teaspoon -- Pharyngeal -- Pharyngeal- Thin Cup Delayed swallow initiation-pyriform sinuses;Penetration/Aspiration during swallow Pharyngeal Material enters airway, CONTACTS cords and then ejected out;Material enters airway, remains ABOVE vocal cords then ejected out Pharyngeal- Thin Straw Delayed swallow initiation-pyriform sinuses;Penetration/Aspiration during swallow Pharyngeal Material enters airway, CONTACTS cords and then ejected out;Material enters airway, remains ABOVE vocal cords then ejected out Pharyngeal- Puree Delayed swallow initiation-vallecula Pharyngeal -- Pharyngeal- Mechanical Soft Delayed swallow initiation-vallecula Pharyngeal -- Pharyngeal- Regular -- Pharyngeal -- Pharyngeal- Multi-consistency -- Pharyngeal -- Pharyngeal- Pill WFL Pharyngeal -- Pharyngeal Comment --  CHL IP CERVICAL ESOPHAGEAL PHASE 07/13/2021 Cervical Esophageal Phase WFL Pudding Teaspoon -- Pudding Cup -- Honey Teaspoon -- Honey Cup -- Nectar Teaspoon -- Nectar Cup -- Nectar Straw -- Thin Teaspoon -- Thin Cup -- Thin Straw -- Puree -- Mechanical Soft -- Regular -- Multi-consistency -- Pill -- Cervical Esophageal Comment -- Angela Nevin, MA, CCC-SLP Speech Therapy             No results for input(s): WBC, HGB, HCT, PLT in the last 72 hours.  No results for input(s): NA, K, CL, CO2, GLUCOSE, BUN, CREATININE, CALCIUM in the last 72 hours.   Intake/Output Summary (Last 24 hours) at 07/15/2021 0800 Last data filed at 07/14/2021 1815 Gross per 24 hour  Intake  708 ml  Output --  Net 708 ml          Physical Exam: Vital Signs Blood pressure (!) 139/98, pulse (!) 53, temperature 97.9 F (36.6 C), temperature source Oral, resp. rate 18, height 6\' 2"  (1.88 m), weight 75.5 kg, SpO2 99  %.   General: No acute distress Mood and affect are appropriate Heart: Regular rate and rhythm no rubs murmurs or extra sounds Lungs: Clear to auscultation, breathing unlabored, no rales or wheezes Abdomen: Positive bowel sounds, soft nontender to palpation, nondistended Extremities: No clubbing, cyanosis, or edema Skin: No evidence of breakdown, no evidence of rash  MSK- pain with RIght shoulder flex abd and ext rotation, no swelling or erythema, no hypersensitivity  Neuro: Alert Motor: 5/5 in left deltoid, bicep, tricep, grip, hip flexor, knee extensors, ankle dorsiflexor and plantar flexor Sensation- absent to LT in RIght hand  RLE 3- hip knee ext synergy , 3- Right ankle PF  Assessment/Plan: 1. 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  Stable for D/C today to SNF  F/u PCP in 3-4 weeks F/u PM&R 2 weeks See D/C summary See D/C instructions   Care Tool:  Bathing    Body parts bathed by patient: Right arm, Chest, Abdomen, Right upper leg, Left upper leg, Face, Front perineal area, Buttocks   Body parts bathed by helper: Right arm, Left arm, Buttocks, Right lower leg, Left lower leg Body parts n/a: Front perineal area   Bathing assist Assist Level: Minimal Assistance - Patient > 75%     Upper Body Dressing/Undressing Upper body dressing   What is the patient wearing?: Pull over shirt    Upper body assist Assist Level: Moderate Assistance - Patient 50 - 74%    Lower Body Dressing/Undressing Lower body dressing      What is the patient wearing?: Pants     Lower body assist Assist for lower body dressing: Moderate Assistance - Patient 50 - 74%     Toileting Toileting    Toileting assist Assist for toileting: Maximal Assistance - Patient 25 - 49%     Transfers Chair/bed transfer  Transfers assist  Chair/bed transfer activity did not occur: Safety/medical concerns  Chair/bed transfer assist level: Moderate Assistance - Patient 50 - 74%      Locomotion Ambulation   Ambulation assist   Ambulation activity did not occur: Safety/medical concerns  Assist level: Moderate Assistance - Patient 50 - 74% Assistive device: Walker-rolling Max distance: 20 ft with RW; 38 ft with hallway handrail   Walk 10 feet activity   Assist  Walk 10 feet activity did not occur: Safety/medical concerns  Assist level: Moderate Assistance - Patient - 50 - 74% Assistive device: Walker-rolling   Walk 50 feet activity   Assist Walk 50 feet with 2 turns activity did not occur: Safety/medical concerns         Walk 150 feet activity   Assist Walk 150 feet activity did not occur: Safety/medical concerns         Walk 10 feet on uneven surface  activity   Assist Walk 10 feet on uneven surfaces activity did not occur: Safety/medical concerns         Wheelchair     Assist Is the patient using a wheelchair?: Yes Type of Wheelchair: Manual Wheelchair activity did not occur: Safety/medical concerns  Wheelchair assist level: Minimal Assistance - Patient > 75% Max wheelchair distance: 120 ft    Wheelchair 50 feet with 2 turns activity  Assist    Wheelchair 50 feet with 2 turns activity did not occur: Safety/medical concerns   Assist Level: Minimal Assistance - Patient > 75%   Wheelchair 150 feet activity     Assist  Wheelchair 150 feet activity did not occur: Safety/medical concerns       Blood pressure (!) 139/98, pulse (!) 53, temperature 97.9 F (36.6 C), temperature source Oral, resp. rate 18, height 6\' 2"  (1.88 m), weight 75.5 kg, SpO2 99 %.  Medical Problem List and Plan: 1.  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  Antithrombotics: -DVT/anticoagulation:  Mechanical: Sequential compression devices, below knee Bilateral lower extremities             -antiplatelet therapy: N/A 3. Pain:  continue Neurontin 100 mg nightly, hydrocodone as needed  Controlled on 9/30 4. Mood: Provide emotional support             -antipsychotic agents: N/A 5. Neuropsych: This patient is capable of making decisions on his own behalf. 6. Skin/Wound Care: Routine skin checks- added lac hydrin  7. Fluids/Electrolytes/Nutrition: Routine in and outs Continue megestrol  8.  Post stroke dysphagia.  Dysphagia #2 nectar thick liquids.  Follow-up speech therapy, poor intake due to lethargy now on appetite stimulant and neurostimulant   Advance diet as tolerated 9.  Hypertension.  Hydralazine 50 mg every 8 hours, Cozaar 100 mg daily.  Monitor with increased mobility Vitals:   07/14/21 2001 07/15/21 0519  BP: (!) 127/93 (!) 139/98  Pulse: 63 (!) 53  Resp: 16 18  Temp: 98.4 F (36.9 C) 97.9 F (36.6 C)  SpO2: 98% 99%  Controlled   10.  Combined congestive heart failure.  Lasix decreased from 40mg  to 20mg   Filed Weights   07/05/21 0331 07/06/21 0456 07/09/21 0318  Weight: 77.2 kg 77 kg 75.5 kg   11.  Chronic bilateral lower extremity edema/cellulitis.  Continue Lasix.  Complete course of doxycycline 12.  Atrial fibrillation.  Eliquis held due to ICH.  Cardiac rate controlled 13.  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  Follow-up cardiology services 14.  Hyperlipidemia.  Currently holding statin due to ICH 15.  Chronic thrombocytopenia.    Normal at 195K on 9/26 16.  Lethargy: Multifactorial  Repeat CT showed no new bleed or infarct, CBC normal and afebrile to doubt infection, Thalamic strokes often cause problems with arousal Methylphenidate initiated improved initiation and participation some increased anxiety will reduce to 5mg  -eval at office f/u for d/c  Increase methylphenidate to 10mg  BID 17.  Urinary retention, resolved  LOS: 18 days A FACE TO FACE EVALUATION WAS PERFORMED  07/08/21 07/15/2021, 8:00 AM

## 2021-07-15 NOTE — Plan of Care (Signed)
  Problem: RH Bed to Chair Transfers Goal: LTG Patient will perform bed/chair transfers w/assist (PT) Description: LTG: Patient will perform bed to chair transfers with assistance (PT). Outcome: Adequate for Discharge Flowsheets (Taken 07/14/2021 1636) LTG: Pt will perform Bed to Chair Transfers with assistance level: Moderate Assistance - Patient 50 - 74%  Pt d/c location changed from home to SNF with continued rehab for therapy and continued progression toward improved functional mobility.  Current LOA required for goal is appropriate for d/c location.    Problem: RH Balance Goal: LTG Patient will maintain dynamic sitting balance (PT) Description: LTG:  Patient will maintain dynamic sitting balance with assistance during mobility activities (PT) Outcome: Completed/Met Flowsheets (Taken 07/14/2021 1636) LTG: Pt will maintain dynamic sitting balance during mobility activities with:: Supervision/Verbal cueing Goal: LTG Patient will maintain dynamic standing balance (PT) Description: LTG:  Patient will maintain dynamic standing balance with assistance during mobility activities (PT) Outcome: Completed/Met Flowsheets (Taken 07/14/2021 1636) LTG: Pt will maintain dynamic standing balance during mobility activities with:: Minimal Assistance - Patient > 75%   Problem: Sit to Stand Goal: LTG:  Patient will perform sit to stand with assistance level (PT) Description: LTG:  Patient will perform sit to stand with assistance level (PT) Outcome: Completed/Met Flowsheets (Taken 07/14/2021 1636) LTG: PT will perform sit to stand in preparation for functional mobility with assistance level: Minimal Assistance - Patient > 75%   Problem: RH Bed Mobility Goal: LTG Patient will perform bed mobility with assist (PT) Description: LTG: Patient will perform bed mobility with assistance, with/without cues (PT). Outcome: Completed/Met Flowsheets (Taken 07/14/2021 1636) LTG: Pt will perform bed mobility with  assistance level of: Minimal Assistance - Patient > 75%   Problem: RH Ambulation Goal: LTG Patient will ambulate in controlled environment (PT) Description: LTG: Patient will ambulate in a controlled environment, # of feet with assistance (PT). Outcome: Completed/Met Flowsheets (Taken 07/14/2021 1636) LTG: Pt will ambulate in controlled environ  assist needed:: Minimal Assistance - Patient > 75% LTG: Ambulation distance in controlled environment: 38 ft Goal: LTG Patient will ambulate in home environment (PT) Description: LTG: Patient will ambulate in home environment, # of feet with assistance (PT). Outcome: Completed/Met Flowsheets (Taken 07/14/2021 1636) LTG: Pt will ambulate in home environ  assist needed:: Minimal Assistance - Patient > 75% LTG: Ambulation distance in home environment: 20 ft with RW   Problem: RH Wheelchair Mobility Goal: LTG Patient will propel w/c in controlled environment (PT) Description: LTG: Patient will propel wheelchair in controlled environment, # of feet with assist (PT) Outcome: Completed/Met Flowsheets Taken 07/14/2021 1636 LTG: Pt will propel w/c in controlled environ  assist needed:: Minimal Assistance - Patient > 75% Taken 06/28/2021 1716 LTG: Propel w/c distance in controlled environment: 150 feet Goal: LTG Patient will propel w/c in home environment (PT) Description: LTG: Patient will propel wheelchair in home environment, # of feet with assistance (PT). Outcome: Completed/Met Flowsheets Taken 07/14/2021 1636 by Alger Simons, PT Distance: wheelchair distance in controlled environment: 150 Taken 07/07/2021 1604 by Lorie Phenix, PT LTG: Pt will propel w/c in home environ  assist needed:: Minimal Assistance - Patient > 75% Taken 06/28/2021 1716 by Judieth Keens A, PT LTG: Propel w/c distance in home environment: at least 50 feet

## 2021-07-15 NOTE — Progress Notes (Signed)
INPATIENT REHABILITATION DISCHARGE NOTE   Discharge instructions by: PA  Verbalized understanding:  Skin care/Wound care: MASD to bottom. Barrier cream applied.  Pain: no pain  IV's: no IV's  Tubes/Drains: none  Safety instructions:  Patient belongings: sent with [facility]  Discharged to: [facility]  Discharged via: [facility]  Notes:

## 2021-07-15 NOTE — Progress Notes (Signed)
Patient ID: Carlos Welch, male   DOB: [birth year], 75 y.o.   MRN: [id number]  Patient will admit to room 128B. Number for report [phone]  [name] [phone]

## 2021-08-30 ENCOUNTER — Encounter: Payer: Medicare Other | Attending: Physical Medicine & Rehabilitation | Admitting: Physical Medicine & Rehabilitation

## 2021-08-30 ENCOUNTER — Other Ambulatory Visit: Payer: Self-pay

## 2021-08-30 ENCOUNTER — Encounter: Payer: Self-pay | Admitting: Physical Medicine & Rehabilitation

## 2021-08-30 VITALS — BP 152/92 | HR 65 | Ht 74.0 in

## 2021-08-30 DIAGNOSIS — I69151 Hemiplegia and hemiparesis following nontraumatic intracerebral hemorrhage affecting right dominant side: Secondary | ICD-10-CM | POA: Insufficient documentation

## 2021-08-30 DIAGNOSIS — G890 Central pain syndrome: Secondary | ICD-10-CM | POA: Insufficient documentation

## 2021-08-30 NOTE — Patient Instructions (Signed)
Discontinue methylphenidate  Cont PT, OT  F/u with neuro 11/22 to determine whether eliquis needs to be resumed

## 2021-08-30 NOTE — Progress Notes (Signed)
Subjective:    Patient ID: Carlos Welch, male    DOB: September 18, 1946, 75 y.o.   MRN: [id number]  75 y.o. right-handed male with history of atrial fibrillation maintained on Eliquis, aortic stenosis status post aortic valve replacement CAD with CABG bilateral lower extremity cellulitis combined CHF hypertension hyperlipidemia remote tobacco use.  Per chart review lives with daughter and family.  Presented 06/23/2021 with acute onset of right-sided weakness and slurred speech.  Cranial CT scan showed acute hemorrhage centered in the left thalamus with an estimated volume of 9 mL.  No intraventricular or extra-axial extension of blood.  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  His chronic Eliquis was discontinued due to hematoma.  CT angiogram head and neck negative for LVO.  Positive for bilateral cervical ICA pseudoaneurysms, 13 x 20 mm on the right 9 x 9 mm on the left.  Patient did not receive tPA due to hematoma.  MRI of the brain follow-up showed stable size of intraparenchymal hematoma.  Admission chemistries unremarkable except BUN 27.  Echocardiogram with ejection fraction of 60 to 65% no wall motion abnormalities.  Maintain on a dysphagia #2 nectar thick liquid diet.  Therapy evaluations completed due to patient's decreased functional mobility was admitted for a comprehensive rehab program. Admit date: 06/27/2021 Discharge date: 07/15/2021 HPI Hospital follow up from rehab stay at [facility] following [place] [facility], OT at SNF , amb with walker and physical assist with gait belt RUE still feels weak but improving per pt report  Pt had one fall at SNF, tried to get into bed independantly , no injury  Still has RUE and RLE numbness with intermittent pain Take gabapentin 100mg  TID Pain Inventory Average Pain 3 Pain Right Now 3 My pain is intermittent and aching  LOCATION OF PAIN  RIGHT LEG & RIGHT ARM  BOWEL Number of stools per week: 7 Oral laxative  use Yes  Type of laxative Senna  BLADDER Normal and Pads    Mobility walk without assistance use a walker use a wheelchair needs help with transfers Do you have any goals in this area?  yes  Function retired I need assistance with the following:  feeding, dressing, bathing, toileting, meal prep, household duties, and shopping Do you have any goals in this area?  yes  Neuro/Psych weakness numbness trouble walking dizziness  Prior Studies Any changes since last visit?  no  Physicians involved in your care Any changes since last visit?  no   Family History  Problem Relation Age of Onset   CAD Neg Hx    Social History   Socioeconomic History   Marital status: Divorced    Spouse name: Not on file   Number of children: Not on file   Years of education: Not on file   Highest education level: Not on file  Occupational History   Not on file  Tobacco Use   Smoking status: Former    Packs/day: 3.00    Years: 47.00    Pack years: 141.00    Types: Cigarettes, Cigars    Start date: 1964    Quit date: 07/28/2010    Years since quitting: 11.0   Smokeless tobacco: Never  Vaping Use   Vaping Use: Never used  Substance and Sexual Activity   Alcohol use: No   Drug use: No   Sexual activity: Not on file  Other Topics Concern   Not on file  Social History Narrative  Not on file   Social Determinants of Health   Financial Resource Strain: Not on file  Food Insecurity: Not on file  Transportation Needs: Not on file  Physical Activity: Not on file  Stress: Not on file  Social Connections: Not on file   Past Surgical History:  Procedure Laterality Date   AORTIC VALVE REPLACEMENT     CORONARY ARTERY BYPASS GRAFT  2015   HERNIA REPAIR     REMOVAL OF IMPLANT  03/14/2020   TEE WITHOUT CARDIOVERSION N/A 05/13/2020   Procedure: TRANSESOPHAGEAL ECHOCARDIOGRAM (TEE);  Surgeon: Lewayne Bunting, MD;  Location: [facility] ENDOSCOPY;  Service: Cardiovascular;  Laterality: N/A;    Past Medical History:  Diagnosis Date   A-fib (HCC)    Abscess of jaw 07/19/2020   Acute on chronic heart failure with preserved ejection fraction (HCC) 07/08/2020   Aortic stenosis    a. s/p pericardial AVR 2015.   Aortic valve stenosis 07/19/2020   Bacteremia 07/19/2020   Bacterial endocarditis    BPH (benign prostatic hyperplasia) 07/08/2020   CAD (coronary artery disease)    a. s/p CABG, AVR, LAA clipping 2015 at [facility].   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/08/2020   Elevated troponin 07/08/2020   Essential hypertension 07/08/2020   Generalized abdominal pain    History of noncompliance with medical treatment    Hyperlipemia 07/08/2020   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   Malnutrition (HCC)    MRSA bacteremia    Nonrheumatic mitral valve regurgitation 07/19/2020   Normocytic anemia 07/08/2020   Pressure injury of skin 05/12/2020   S/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 2015   Severe malnutrition (HCC) 07/19/2020   Thrombocytopenia (HCC) 07/08/2020   TIA (transient ischemic attack)    Weight loss 07/19/2020   BP (!) 152/92   Pulse 65   Ht 6\' 2"  (1.88 m)   BMI 21.37 kg/m   Opioid Risk Score:   Fall Risk Score:  `1  Depression screen PHQ 2/9  Depression screen PHQ 2/9 08/30/2021  Decreased Interest 0  Down, Depressed, Hopeless 0  PHQ - 2 Score 0  Altered sleeping 0  Tired, decreased energy 0  Change in appetite 0  Feeling bad or failure about yourself  0  Trouble concentrating 0  Moving slowly or fidgety/restless 0  Suicidal thoughts 0  PHQ-9 Score 0    Review of  Systems  Musculoskeletal:  Positive for gait problem.  Skin:  Positive for wound.  Neurological:  Positive for dizziness, weakness and numbness.  All other systems reviewed and are negative.     Objective:   Physical Exam Vitals and nursing note reviewed.  Constitutional:      Appearance: He is normal weight.  HENT:     Head: Normocephalic and atraumatic.  Eyes:     Extraocular Movements: Extraocular movements intact.     Conjunctiva/sclera: Conjunctivae normal.     Pupils: Pupils are equal, round, and reactive to light.  Cardiovascular:     Rate and Rhythm: Normal rate. Rhythm irregular.     Heart sounds: Normal heart sounds. No murmur heard. Pulmonary:     Effort: Pulmonary effort  is normal. No respiratory distress.     Breath sounds: Normal breath sounds.  Abdominal:     General: Abdomen is flat. Bowel sounds are normal. There is no distension.     Palpations: Abdomen is soft.  Musculoskeletal:     Right lower leg: No edema.     Left lower leg: No edema.     Comments: Reduced AROM right shoulder   Skin:    General: Skin is warm and dry.  Neurological:     Mental Status: He is alert. He is disoriented.     Cranial Nerves: No dysarthria.     Sensory: Sensory deficit present.     Motor: Weakness present. No abnormal muscle tone.     Coordination: Coordination abnormal. Impaired rapid alternating movements.     Comments: 4/5 R upper ext 3-/5 R lower ext  Absent LT R side  Psychiatric:        Mood and Affect: Mood normal.        Behavior: Behavior normal.          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  Hx AFib off Eliquis due to [place], f/u Neuro to reassess

## 2021-09-06 ENCOUNTER — Ambulatory Visit (INDEPENDENT_AMBULATORY_CARE_PROVIDER_SITE_OTHER): Payer: Medicare Other | Admitting: Neurology

## 2021-09-06 ENCOUNTER — Encounter: Payer: Self-pay | Admitting: Neurology

## 2021-09-06 ENCOUNTER — Other Ambulatory Visit: Payer: Self-pay

## 2021-09-06 VITALS — BP 141/101 | HR 67

## 2021-09-06 DIAGNOSIS — I6381 Other cerebral infarction due to occlusion or stenosis of small artery: Secondary | ICD-10-CM

## 2021-09-06 DIAGNOSIS — G8191 Hemiplegia, unspecified affecting right dominant side: Secondary | ICD-10-CM | POA: Diagnosis not present

## 2021-09-06 DIAGNOSIS — I619 Nontraumatic intracerebral hemorrhage, unspecified: Secondary | ICD-10-CM | POA: Diagnosis not present

## 2021-09-06 NOTE — Patient Instructions (Signed)
Continue current medications  Continue with physical and occupational therapies  Referral/Follow up with cardiology to discuss need to restart Eliquis  Return in 1 year

## 2021-09-06 NOTE — Progress Notes (Signed)
[facility]  PATIENT: Carlos Welch DOB: 03-11-1946  REQUESTING CLINICIAN: Charlton Amor, PA-C HISTORY FROM: Patient  REASON FOR VISIT: Left thalamic stroke    HISTORICAL  CHIEF COMPLAINT:  Chief Complaint  Patient presents with   Hospitalization Follow-up    Rm 12, alone, in wheelchair, c/o weakness on right side, unable to use right arm and right leg.     HISTORY OF PRESENT ILLNESS:  This is a 75 year old gentleman with past medical history of atrial fibrillation previously on Eliquis, aortic stenosis status post aortic valve replacement, CAD with CABG, CHF, hypertension, hyperlipidemia who is presenting following a left thalamic stroke.  Patient presented to the ED on September 9 with acute right-sided weakness and slurred speech, CT scan showed a acute left thalamic hemorrhage with no intraventricular or extra-axial extension of blood.  His Eliquis was discontinued at that time, CT angiogram head and neck was negative for any large vessel occlusion on any AVM.  Echocardiogram was also done showing a EF of 60 to 65% with no wall motion abnormalities.  Patient hemorrhagic stroke was felt to be secondary to hypotension with use of Eliquis.  Patient was discharged to acute rehab for about 2-1/2-week before being discharged to a skilled nursing facility. He does report improvement of his right-sided weakness, he is able to ambulate with assistive device.  He reported that he had 1 fall while at the facility while transferring to his bed.  Today he presented to the office with transport, no personal from the skilled nursing facility accompanied patient.  Currently is on 3 blood pressure medication including Lasix, hydralazine, and Cozaar.  OTHER MEDICAL CONDITIONS: Atrial fibrillation, aortic stenosis status post aortic valve replacement, coronary artery disease with CABG, CHF, hypertension, and hyperlipidemia.   REVIEW OF SYSTEMS: Full 14 system review of systems  performed and negative with exception of: as noted in the HPI  ALLERGIES: Allergies  Allergen Reactions   Chocolate Rash    9.9.2022 Daughter reports that the patient regularly eats chocolate without any issues.   Peanut-Containing Drug Products Rash    HOME MEDICATIONS: Outpatient Medications Prior to Visit  Medication Sig Dispense Refill   acetaminophen (TYLENOL) 325 MG tablet Take 2 tablets (650 mg total) by mouth every 4 (four) hours as needed for mild pain (or temp > 37.5 C (99.5 F)).     furosemide (LASIX) 20 MG tablet Take 1 tablet (20 mg total) by mouth daily. 30 tablet    gabapentin (NEURONTIN) 100 MG capsule Take 1 capsule (100 mg total) by mouth 3 (three) times daily.     hydrALAZINE (APRESOLINE) 25 MG tablet Take 1 tablet (25 mg total) by mouth every 8 (eight) hours.     losartan (COZAAR) 50 MG tablet Take 1 tablet (50 mg total) by mouth daily.     pantoprazole (PROTONIX) 40 MG tablet Take 1 tablet (40 mg total) by mouth daily. 30 tablet 0   senna-docusate (SENOKOT-S) 8.6-50 MG tablet Take 1 tablet by mouth at bedtime.     tamsulosin (FLOMAX) 0.4 MG CAPS capsule Take 1 capsule (0.4 mg total) by mouth daily after supper. 30 capsule    traMADol (ULTRAM) 50 MG tablet Take 1 tablet (50 mg total) by mouth every 6 (six) hours as needed for severe pain or moderate pain. 5 tablet 0   carvedilol (COREG) 6.25 MG tablet Take 6.25 mg by mouth 2 (two) times daily.     methylphenidate (RITALIN) 5 MG tablet Take 1 tablet (5  mg total) by mouth 2 (two) times daily with breakfast and lunch. 10 tablet 0   No facility-administered medications prior to visit.    PAST MEDICAL HISTORY: Past Medical History:  Diagnosis Date   A-fib ([place])    Abscess of jaw 07/19/2020   Acute on chronic heart failure with preserved ejection fraction ([place]) 07/08/2020   Aortic stenosis    a. s/p pericardial AVR 2015.   Aortic valve stenosis 07/19/2020   Bacteremia 07/19/2020   Bacterial endocarditis    BPH (benign  prostatic hyperplasia) 07/08/2020   CAD (coronary artery disease)    a. s/p CABG, AVR, LAA clipping 2015 at [facility].   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/08/2020   Elevated troponin 07/08/2020   Essential hypertension 07/08/2020   Generalized abdominal pain    History of noncompliance with medical treatment    Hyperlipemia 07/08/2020   Infection associated with implant ([place]) 07/19/2020   Malnutrition ([place])    MRSA bacteremia    Nonrheumatic mitral valve regurgitation 07/19/2020   Normocytic anemia 07/08/2020   Pressure injury of skin 05/12/2020   S/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 2015   Severe malnutrition ([place]) 07/19/2020   Thrombocytopenia ([place]) 07/08/2020   TIA (transient ischemic attack)    Weight loss 07/19/2020    PAST SURGICAL HISTORY: Past Surgical History:  Procedure Laterality Date   AORTIC VALVE REPLACEMENT     CORONARY ARTERY BYPASS GRAFT  2015   HERNIA REPAIR     REMOVAL OF IMPLANT  03/14/2020   TEE WITHOUT CARDIOVERSION N/A 05/13/2020   Procedure: TRANSESOPHAGEAL ECHOCARDIOGRAM (TEE);  Surgeon: Lelon Perla, MD;  Location: [facility] ENDOSCOPY;  Service: Cardiovascular;  Laterality: N/A;    FAMILY HISTORY: Family History  Problem Relation Age of Onset   CAD Neg Hx     SOCIAL HISTORY: Social History   Socioeconomic History   Marital status: Divorced    Spouse name: Not on file   Number of children: Not on file   Years of education: Not on file   Highest education level: Not on file  Occupational History   Not on file  Tobacco Use   Smoking status: Former    Packs/day:  3.00    Years: 47.00    Pack years: 141.00    Types: Cigarettes, Cigars    Start date: 1964    Quit date: 07/28/2010    Years since quitting: 11.1   Smokeless tobacco: Never  Vaping Use   Vaping Use: Never used  Substance and Sexual Activity   Alcohol use: No   Drug use: No   Sexual activity: Not on file  Other Topics Concern   Not on file  Social History Narrative   Not on file   Social Determinants of Health   Financial Resource Strain: Not on file  Food Insecurity: Not on file  Transportation Needs: Not on file  Physical Activity: Not on file  Stress: Not on file  Social Connections: Not on file  Intimate Partner Violence: Not on file    PHYSICAL EXAM  GENERAL EXAM/CONSTITUTIONAL: Vitals:  Vitals:   09/06/21 0942  BP: (!) 141/101  Pulse: 67  There is no height or weight on file to calculate BMI. Wt Readings from Last 3 Encounters:  07/09/21 166 lb 7.2 oz (75.5 kg)  06/23/21 190 lb (86.2 kg)  06/22/21 180 lb (81.6 kg)   Patient is in no distress; well developed, nourished and groomed; neck is supple, sitting in the wheelchair   CARDIOVASCULAR: Examination of carotid arteries is normal; no carotid bruits Regular rate and rhythm, no murmurs Examination of peripheral vascular system by observation and palpation is normal  EYES: Pupils round and reactive to light, Visual fields full to confrontation, Extraocular movements intacts,   MUSCULOSKELETAL: Gait, strength, tone, movements noted in Neurologic exam below  NEUROLOGIC: MENTAL STATUS:  No flowsheet data found. awake, alert, oriented to person, place and time recent and remote memory intact normal attention and concentration language fluent, comprehension intact, naming intact fund of knowledge appropriate  CRANIAL NERVE:  2nd, 3rd, 4th, 6th - pupils equal and reactive to light, visual fields full to confrontation, extraocular muscles intact, no nystagmus 5th - facial sensation symmetric 7th -  There is mild right facial droop 8th - hearing intact 9th - palate elevates symmetrically, uvula midline 11th - shoulder shrug symmetric 12th - tongue protrusion midline  MOTOR:  normal bulk and tone, full strength in the left UE, and LE. RUE and RLE 4/5  SENSORY:  Decrease sensation to light touch in the RUE and RLE.   COORDINATION:  finger-nose-finger, fine finger movements normal  REFLEXES:  deep tendon reflexes present and symmetric  GAIT/STATION:  Deferred     DIAGNOSTIC DATA (LABS, IMAGING, TESTING) - I reviewed patient records, labs, notes, testing and imaging myself where available.  Lab Results  Component Value Date   WBC 6.2 07/11/2021   HGB 16.0 07/11/2021   HCT 48.1 07/11/2021   MCV 86.5 07/11/2021   PLT 195 07/11/2021      Component Value Date/Time   NA 136 07/04/2021 0757   NA 143 08/03/2020 1103   K 4.3 07/04/2021 0757   CL 100 07/04/2021 0757   CO2 28 07/04/2021 0757   GLUCOSE 102 (H) 07/04/2021 0757   BUN 31 (H) 07/04/2021 0757   BUN 23 08/03/2020 1103   CREATININE 1.19 07/04/2021 0757   CREATININE 0.93 07/07/2020 1033   CALCIUM 9.6 07/04/2021 0757   PROT 7.9 06/28/2021 1154   ALBUMIN 3.6 06/28/2021 1154   AST 28 06/28/2021 1154   ALT 17 06/28/2021 1154   ALKPHOS 102 06/28/2021 1154   BILITOT 1.9 (H) 06/28/2021 1154   GFRNONAA >60 07/04/2021 0757   GFRAA 61 08/03/2020 1103   Lab Results  Component Value Date   CHOL 143 06/24/2021   HDL 37 (L) 06/24/2021   LDLCALC 92 06/24/2021   TRIG 72 06/24/2021   CHOLHDL 3.9 06/24/2021   Lab Results  Component Value Date   HGBA1C 5.2 06/23/2021   No results found for: [id number] Lab Results  Component Value Date   TSH 1.178 05/13/2020    Head CT 06/23/2021 1. Acute hemorrhage centered in the left thalamus with an estimated volume of 9 mL. No intraventricular or extra-axial extension of blood. Surrounding edema but only mild regional mass effect. 2. Evidence of underlying progressive  small-vessel disease in the brain since 2015. 3. Generalized intracranial artery tortuosity   CTA head and neck 06/23/2021 1. Negative for LVO, CTA spot sign, or intracranial aneurysm in association with the left thalamic hemorrhage. There is mild left PCA P3 segment irregularity and stenosis  2. Positive for bilateral  cervical ICA pseudoaneurysms, 13 x 20 mm on the right and 9 x 9 mm on the left. This raises the possibility of underlying Fibromuscular Dysplasia (FMD). There is no aneurism-associated stenosis. 3. Positive also for extensive Calcified Atherosclerosis. 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 There is only mild ICA siphon and Basilar Artery stenosis. Aortic Atherosclerosis (ICD10-I70.0). 4. Partially visible chronic lung disease, prior CABG.   MRI Brain 06/24/2021 1. Stable size of intraparenchymal hematoma centered in the left thalamus with surrounding vasogenic edema and regional mass effect. 2. 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 3. No focus of abnormal contrast enhancement.   CT head 06/30/2021 Expected evolutionary changes with indistinctness of the hyperdense blood related to the acute hemorrhage in the left cerebral peduncle, thalamus and adjacent white matter tracts. Surrounding vasogenic edema as expected.   Extensive chronic small-vessel ischemic changes elsewhere throughout the brain are unchanged.   Echo 06/23/2021 1. Left ventricular ejection fraction, by estimation, is 60 to 65%. The left ventricle has normal function. The left ventricle has no regional wall motion abnormalities. There is moderate left ventricular hypertrophy. Left ventricular diastolic parameters are indeterminate.  2. Right ventricular systolic function is moderately reduced. The right ventricular size is mildly enlarged.  Tricuspid regurgitation signal is inadequate for assessing PA pressure.  3. Left atrial size was mildly dilated.  4. Right atrial size was moderately dilated.  5. The mitral valve is abnormal. Moderate mitral annular calcification. Eccentric mitral regurgitation jet, poorly visualized on current study, however was moderate to severe on TEE 04/2020. There does appear to be pulmonic vein systolic reversal, which suggests severe MR. Consider TEE for further evaluation  6. There is a bioprosthetic valve present in the aortic position. Aortic valve regurgitation is not visualized. Echo findings are consistent with normal structure and function of the aortic valve prosthesis. Aortic valve mean gradient measures 11.0 mmHg, stable from prior echoes  7. Aortic dilatation noted. 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  8. The inferior vena cava is dilated in size with <50% respiratory variability, suggesting right atrial pressure of 15 mmHg.   Conclusion(s)/Recommendation(s): No intracardiac source of embolism detected on this transthoracic study. A transesophageal echocardiogram is recommended to exclude cardiac source of embolism if clinically indicated.  ASSESSMENT AND PLAN  75 y.o. year old male with vascular risk factor including atrial fibrillation, on Eliquis prior to the stroke, hypertension, hyperlipidemia, CAD status post CABG who is was recently admitted to the hospital for right hemiplegia and slurred speech and found to have a left thalamic hemorrhagic stroke.  Patient stroke etiology likely hypertension in the setting of Eliquis use.  His repeat brain imaging was stable.  He still remains off Eliquis but he has a history of atrial fibrillation and CABG.  He is pending a cardiology follow-up.  I strongly encouraged him to follow-up with his cardiologist to discuss if and when he needs to restart the Eliquis.  In terms of his stroke the main risk factor is his hypertension.   Currently he is on 3 antihypertensive medications, blood pressure goal should be less than [id number] systolic.  I also encouraged him to continue with aggressive physical and occupational therapies.  I will see him in 1 year for follow-up   1. Thalamic stroke ([place])   2. Hemorrhagic stroke ([place])   3. Right hemiplegia (HCC)     PLAN: Continue current medications  Continue with physical and occupational therapies  Referral/Follow up with cardiology to discuss need to restart Eliquis  Return in 1 year   No orders of the defined types were placed in this encounter.   No orders of the defined types were placed in this encounter.   Return in about 1 year (around 09/06/2022).    Alric Ran, MD 09/06/2021, 9:46 PM  [facility] [address], [place], [place] [address] [phone]

## 2021-09-27 ENCOUNTER — Encounter: Payer: Self-pay | Admitting: Medical

## 2021-09-30 ENCOUNTER — Other Ambulatory Visit: Payer: Self-pay

## 2021-09-30 ENCOUNTER — Non-Acute Institutional Stay: Payer: Medicare Other | Admitting: Hospice

## 2021-10-13 ENCOUNTER — Other Ambulatory Visit (HOSPITAL_BASED_OUTPATIENT_CLINIC_OR_DEPARTMENT_OTHER): Payer: Self-pay

## 2021-11-29 ENCOUNTER — Encounter: Payer: Medicare Other | Attending: Physical Medicine & Rehabilitation | Admitting: Physical Medicine & Rehabilitation

## 2021-11-29 DIAGNOSIS — I69151 Hemiplegia and hemiparesis following nontraumatic intracerebral hemorrhage affecting right dominant side: Secondary | ICD-10-CM | POA: Insufficient documentation

## 2021-11-29 DIAGNOSIS — G890 Central pain syndrome: Secondary | ICD-10-CM | POA: Insufficient documentation

## 2021-12-04 IMAGING — DX DG CHEST 2V
2 series · 2 of 2 positions shown · non-contrast
Comparison: 05/13/2020

CLINICAL DATA: CHF, pedal edema

EXAM:
CHEST - 2 VIEW

[chest pa]
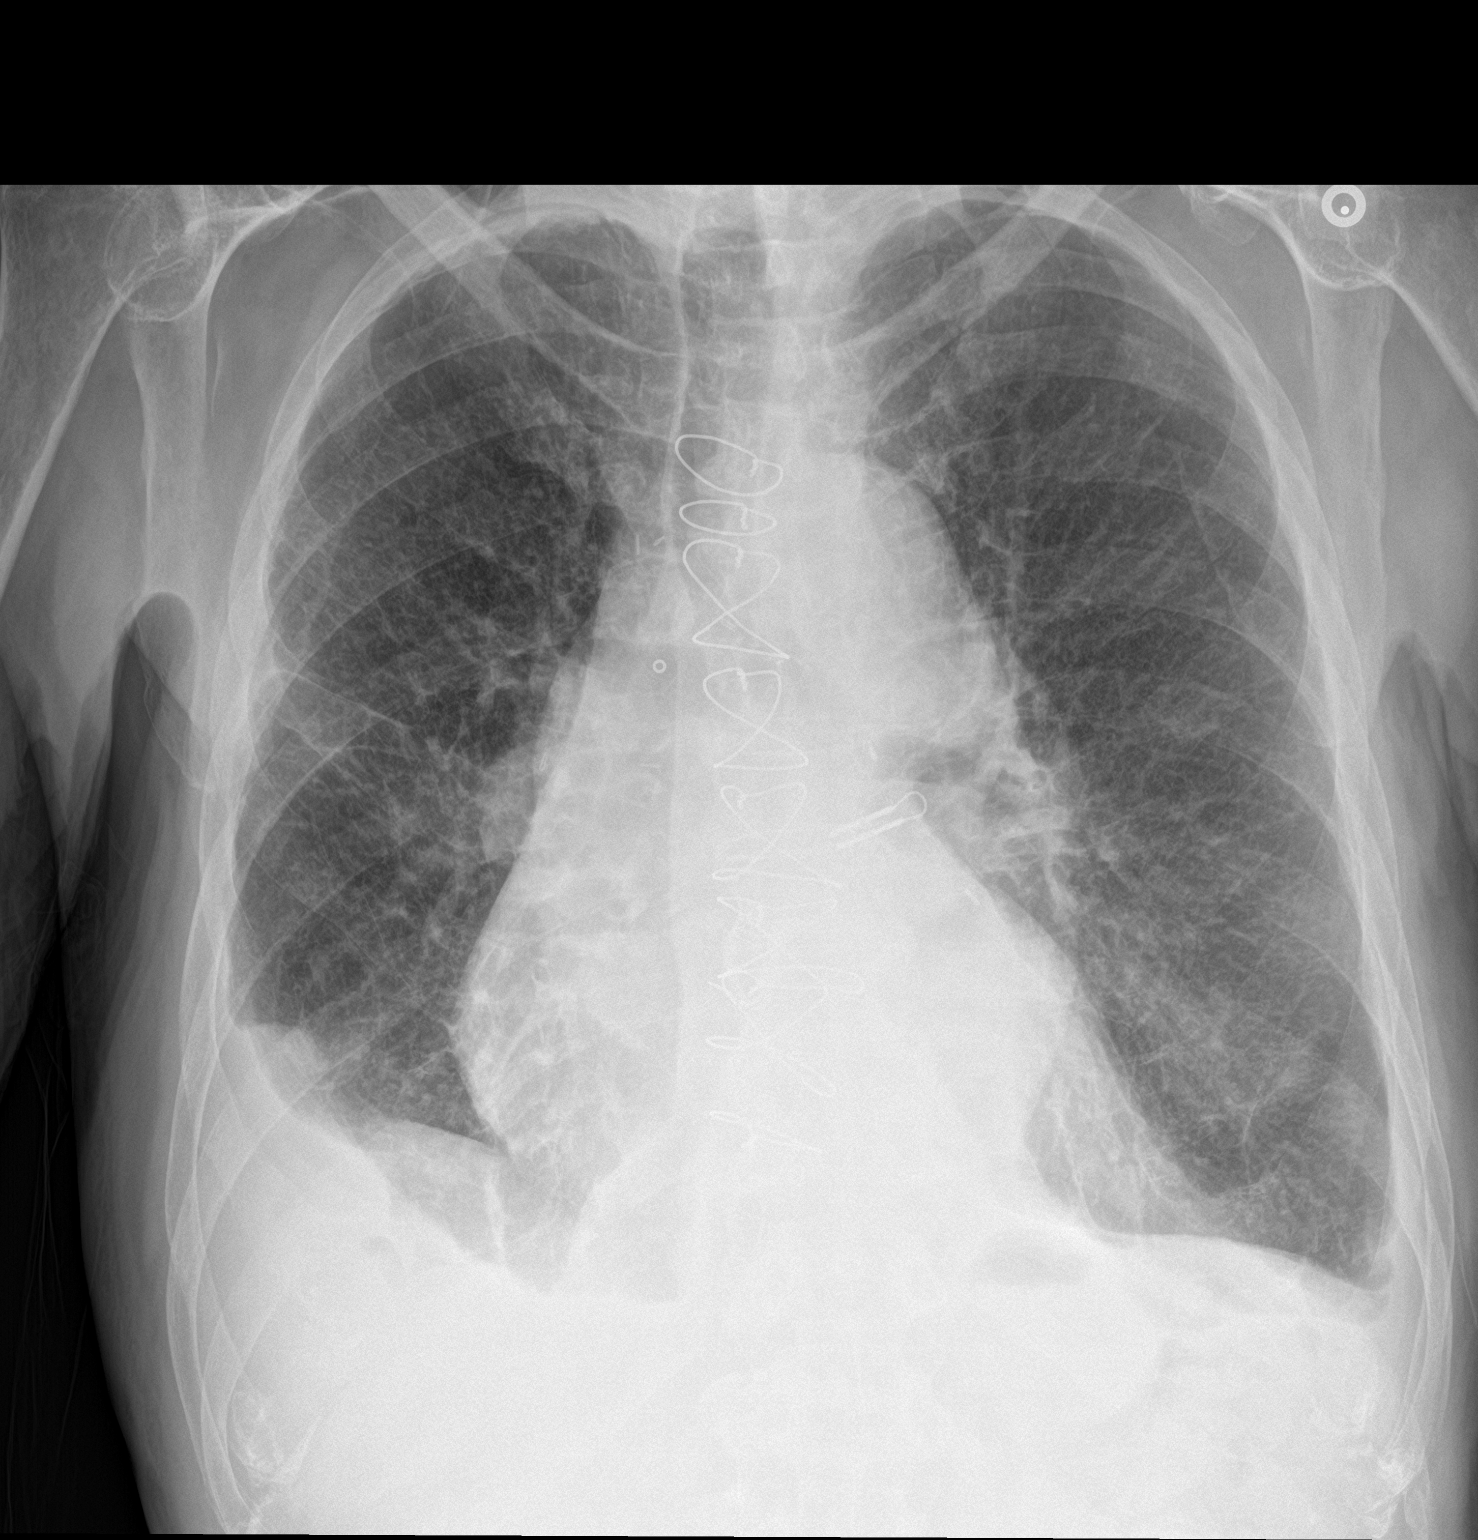

[chest lat]
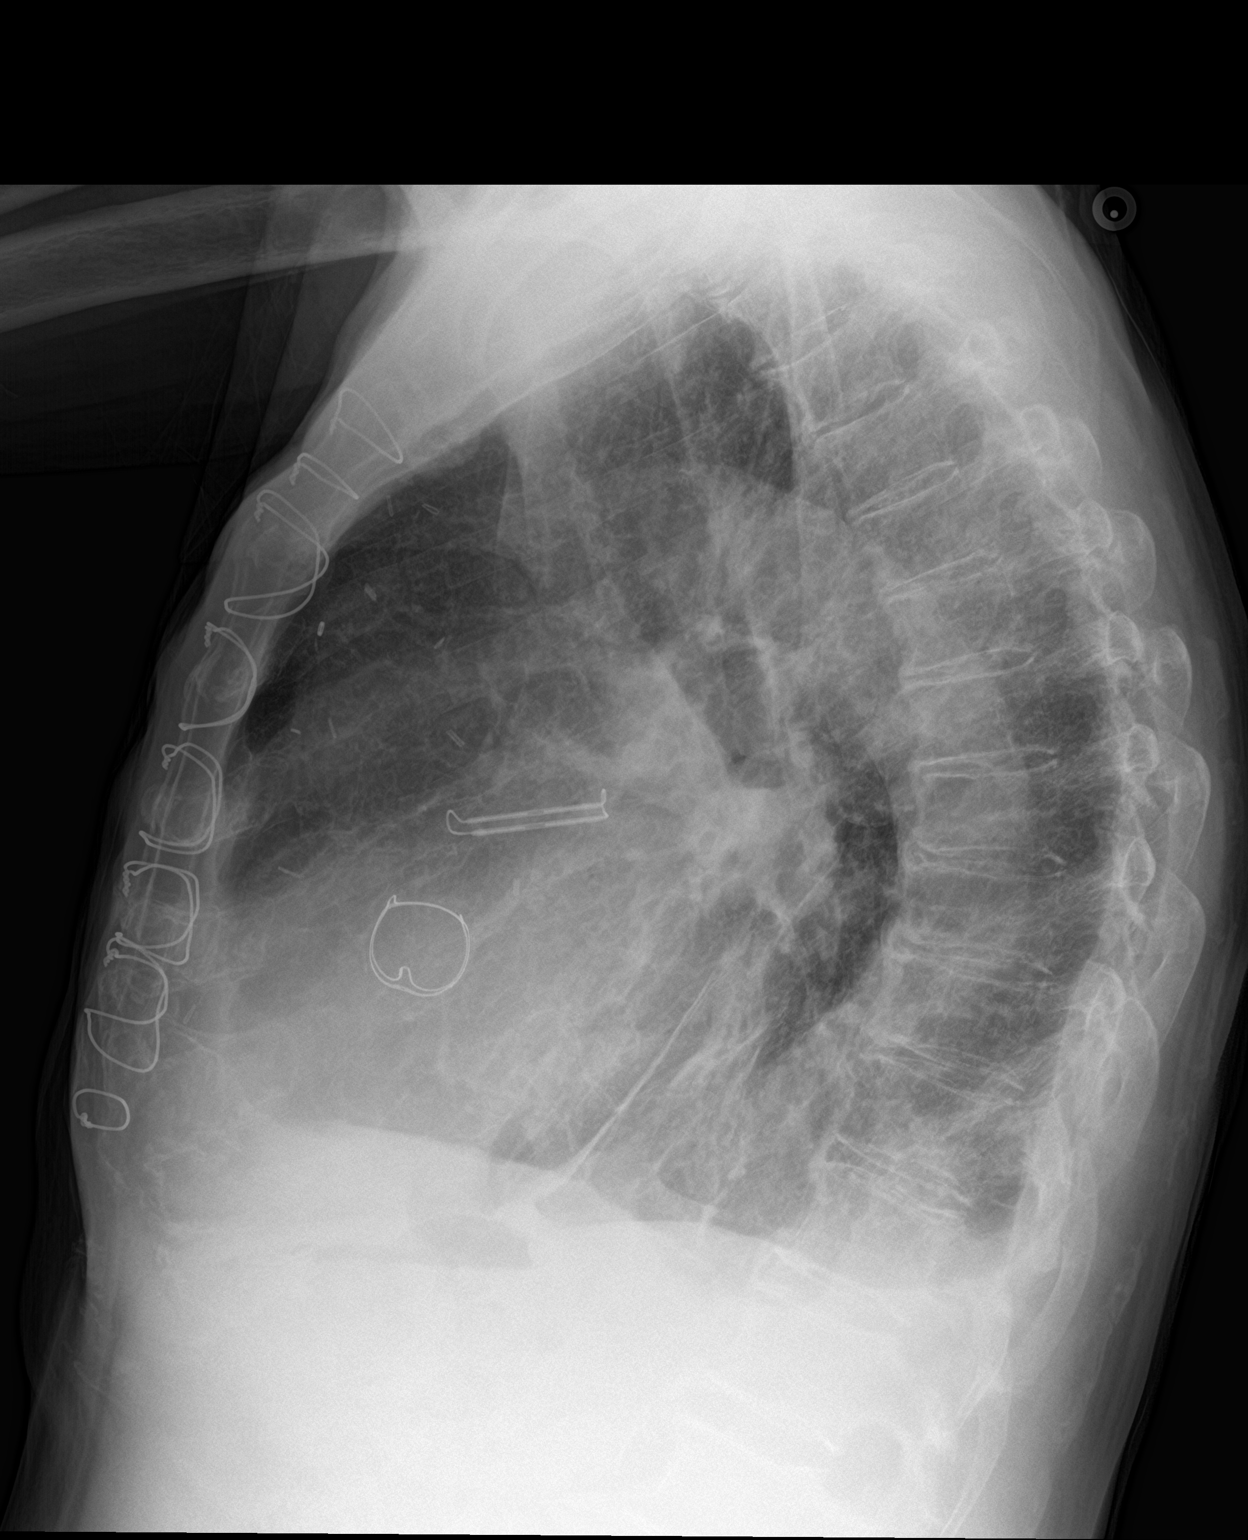

[2 of 2 positions shown; findings below may reference images not displayed]

FINDINGS: Small, right greater than left bilateral pleural effusions, likely
loculated on the right. Diffuse bilateral interstitial opacity,
similar to prior. Cardiomegaly status post median sternotomy with
aortic valve prosthesis and atrial appendage clip. Disc degenerative
disease of the thoracic spine.
IMPRESSION: 1. Small, right greater than left bilateral pleural effusions,
likely loculated on the right and similar to prior.

2. Diffuse bilateral interstitial opacity, similar to prior and most
consistent with edema. No new or focal airspace opacity.

3.  Cardiomegaly.

## 2021-12-15 ENCOUNTER — Other Ambulatory Visit: Payer: Self-pay

## 2021-12-15 ENCOUNTER — Non-Acute Institutional Stay: Payer: Medicare Other | Admitting: *Deleted

## 2021-12-20 ENCOUNTER — Non-Acute Institutional Stay: Payer: Medicare Other | Admitting: Hospice

## 2021-12-20 ENCOUNTER — Other Ambulatory Visit: Payer: Self-pay

## 2021-12-20 DIAGNOSIS — Z515 Encounter for palliative care: Secondary | ICD-10-CM

## 2021-12-20 DIAGNOSIS — I5042 Chronic combined systolic (congestive) and diastolic (congestive) heart failure: Secondary | ICD-10-CM

## 2021-12-20 DIAGNOSIS — I69151 Hemiplegia and hemiparesis following nontraumatic intracerebral hemorrhage affecting right dominant side: Secondary | ICD-10-CM

## 2021-12-20 DIAGNOSIS — K5901 Slow transit constipation: Secondary | ICD-10-CM

## 2021-12-20 NOTE — Progress Notes (Signed)
? ? ?Manufacturing engineer ?Community Palliative Care Consult Note ?Telephone: [phone]  ?Fax: [phone] ? ?PATIENT NAME: Carlos Welch ?DOB: 09/06/1946 ?MRN: [id number] ? ?PRIMARY CARE PROVIDER:   Mackie Pai, PA-C ?Mackie Pai, PA-C ?[address] ?[address] ?[place],  [place] [address] ? ?REFERRING PROVIDER: Mackie Pai, PA-C ?Mackie Pai, PA-C ?[address] ?[address] ?[place],  [place] [address] ? ?RESPONSIBLE PARTY:  Self ?Contact Information   ? ? Name Relation Home Work Mobile  ? Hearn,Heather Daughter   [phone]  ? ?  ? ? ?Visit is to build trust and highlight Palliative Medicine as specialized medical care for people living with serious illness, aimed at facilitating better quality of life through symptoms relief, assisting with advance care planning and complex medical decision making. This is a follow up visit. ? ?RECOMMENDATIONS/PLAN:  ? ?Advance Care Planning/Code Status: Patient affirmed that he is a full code ? ?Goals of Care: Goals of care include to maximize quality of life and symptom management. ? ?Visit consisted of counseling and education dealing with the complex and emotionally intense issues of symptom management and palliative care in the setting of serious and potentially life-threatening illness. Palliative care team will continue to support patient, patient's family, and medical team. ? ?Symptom management/Plan:  ?Hemiplegia/hemiparesis: R side. Continue active/passive ROM and participate in facility restorative exercises. Balance of rest and performance activity.  ? ?CHF: Continue Lasix as ordered. Elevate BLE during the day as much as possible to promote circulation. Adhere to Fluid and salt limits. Monitor weight closely and report weight gain of 3 Ibs in a day or 5 Ibs in a week.  ? ?Constipation: Managed with Senekot S.  ?Follow up: Palliative care will continue to follow for complex medical decision making, advance care planning, and clarification of  goals. Return 6 weeks or prn. Encouraged to call provider sooner with any concerns. ? ?CHIEF COMPLAINT: Palliative follow up visit ?HISTORY OF PRESENT ILLNESS:  Carlos Welch a 76 y.o. 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 History obtained from review of EMR, discussion with primary team, family and/or patient. Records reviewed and summarized above. All 10 point systems reviewed and are negative except as documented in history of present illness above ? ?Review and summarization of Epic records shows history from other than patient.  ? ?Palliative Care was asked to follow this patient o help address complex decision making in the context of advance care planning and goals of care clarification. I reviewed, as needed, available labs, patient records, imaging, studies and related documents from the EMR.   ? ?PHYSICAL EXAM  ?Height/Weight: 6 feet 2 inches/168 Ib ?General: In no acute distress, appropriately dressed ?Cardiovascular: regular rate and rhythm; no edema in BLE ?Pulmonary: no cough, no increased work of breathing, normal respiratory effort ?Abdomen: soft, non tender, no guarding, positive bowel sounds in all quadrants ?GU:  no suprapubic tenderness ?Eyes: Normal lids, no discharge ?ENMT: Moist mucous membranes ?Musculoskeletal: Right side weakness, non ambulatory ?Skin: Dry skin, hair loss and coolness to BLE ?Psych: non-anxious affect ?Neurological: Weakness but otherwise non focal ?Heme/lymph/immuno: no bruises, no bleeding ? ?PERTINENT MEDICATIONS:  ?Outpatient Encounter Medications as of 12/20/2021  ?Medication Sig  ? acetaminophen (TYLENOL) 325 MG tablet Take 2 tablets (650 mg total) by mouth every 4 (four) hours as needed for mild pain (or temp > 37.5 C (99.5 F)).  ? furosemide (LASIX)  20 MG tablet Take 1 tablet (20 mg total) by mouth daily.  ? gabapentin (NEURONTIN) 100 MG capsule  Take 1 capsule (100 mg total) by mouth 3 (three) times daily.  ? hydrALAZINE (APRESOLINE) 25 MG tablet Take 1 tablet (25 mg total) by mouth every 8 (eight) hours.  ? losartan (COZAAR) 50 MG tablet Take 1 tablet (50 mg total) by mouth daily.  ? pantoprazole (PROTONIX) 40 MG tablet Take 1 tablet (40 mg total) by mouth daily.  ? senna-docusate (SENOKOT-S) 8.6-50 MG tablet Take 1 tablet by mouth at bedtime.  ? tamsulosin (FLOMAX) 0.4 MG CAPS capsule Take 1 capsule (0.4 mg total) by mouth daily after supper.  ? traMADol (ULTRAM) 50 MG tablet Take 1 tablet (50 mg total) by mouth every 6 (six) hours as needed for severe pain or moderate pain.  ? ?No facility-administered encounter medications on file as of 12/20/2021.  ? ? ?HOSPICE ELIGIBILITY/DIAGNOSIS: TBD ? ?PAST MEDICAL HISTORY:  ?Past Medical History:  ?Diagnosis Date  ? A-fib ([place])   ? Abscess of jaw 07/19/2020  ? Acute on chronic heart failure with preserved ejection fraction ([place]) 07/08/2020  ? Aortic stenosis   ? a. s/p pericardial AVR 2015.  ? Aortic valve stenosis 07/19/2020  ? Bacteremia 07/19/2020  ? Bacterial endocarditis   ? BPH (benign prostatic hyperplasia) 07/08/2020  ? CAD (coronary artery disease)   ? a. s/p CABG, AVR, LAA clipping [facility] at [facility].  ? Cellulitis of lower extremity 07/08/2020  ? Chronic atrial fibrillation ([place]) 07/08/2020  ? Chronic combined systolic and diastolic CHF (congestive heart failure) ([place])   ? Cognitive communication deficit 07/19/2020  ? Coronary artery disease involving coronary bypass graft of native heart without angina pectoris 07/19/2020  ? Debility 07/19/2020  ? 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 Elevated troponin 07/08/2020  ? Essential hypertension 07/08/2020  ? Generalized abdominal pain   ? History of noncompliance with medical treatment   ? Hyperlipemia 07/08/2020  ? Infection associated with implant ([place]) 07/19/2020  ? Malnutrition ([place])   ? MRSA bacteremia   ? Nonrheumatic mitral  valve regurgitation 07/19/2020  ? Normocytic anemia 07/08/2020  ? Pressure injury of skin 05/12/2020  ? S/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 2015  ? Severe malnutrition ([place]) 07/19/2020  ? Thrombocytopenia ([place]) 07/08/2020  ? TIA (transient ischemic attack)   ? Weight loss 07/19/2020  ?  ? ? ?ALLERGIES:  ?Allergies  ?Allergen Reactions  ? Chocolate Rash  ?  9.9.2022 Daughter reports that the patient regularly eats chocolate without any issues.  ? Peanut-Containing Drug Products Rash  ?   ? ?I spent 50 minutes providing this consultation; this includes time spent with patient/family, chart review and documentation. More than 50% of the time in this consultation was spent on counseling and coordinating communication  ? ?Thank you for the opportunity to participate in the care of Lyons Sanmiguel Please call our office at [phone] if we can be of additional assistance. ? ?Note: Portions of this note were generated with Lobbyist. Dictation errors may occur despite best attempts at proofreading. ? ?Teodoro Spray, NP ? ?  ?

## 2021-12-22 IMAGING — CR DG CHEST 2V
2 series · 2 of 2 positions shown · non-contrast
Comparison: June 16, 2020.

CLINICAL DATA: Shortness of breath

EXAM:
CHEST - 2 VIEW

[w chest pa]
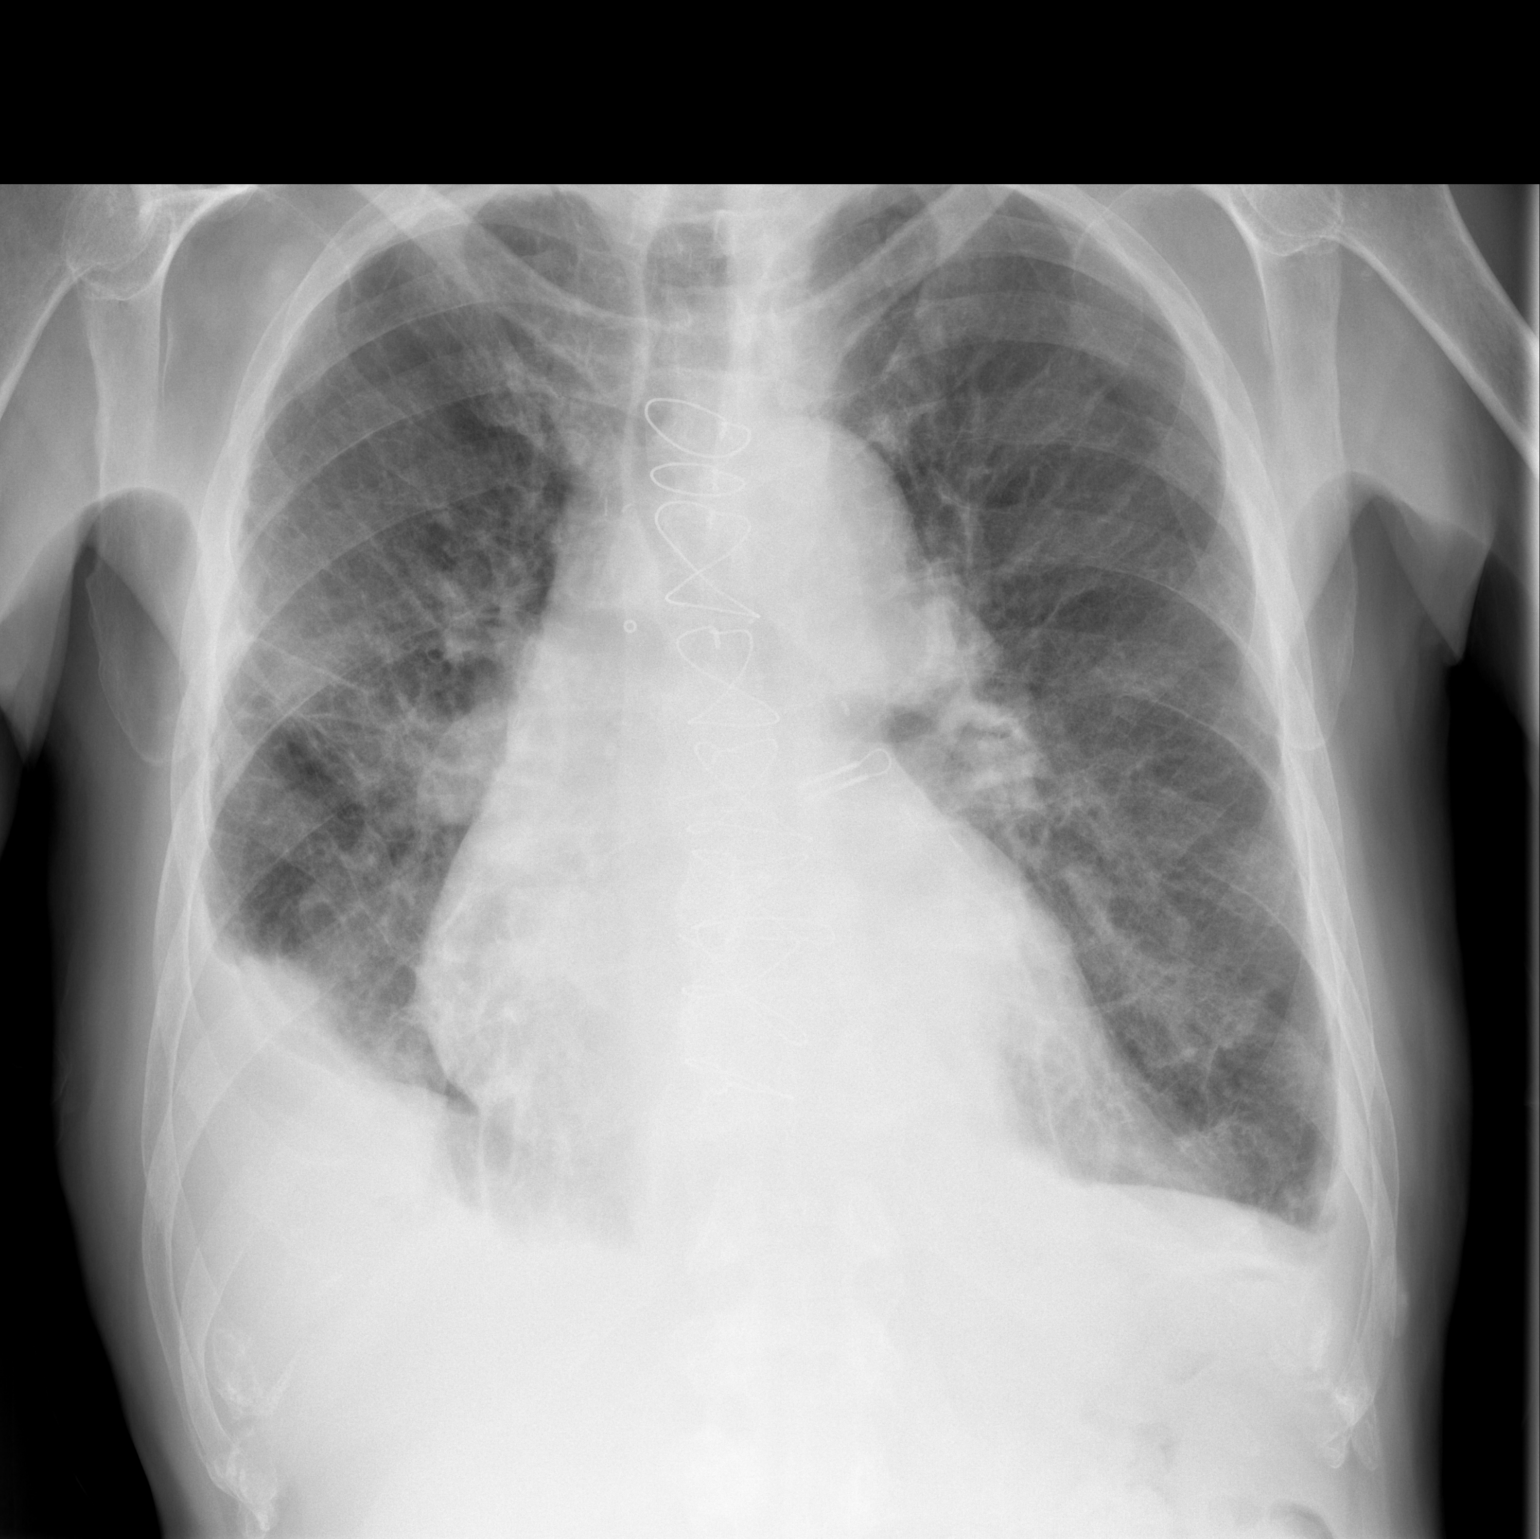

[w chest lat]
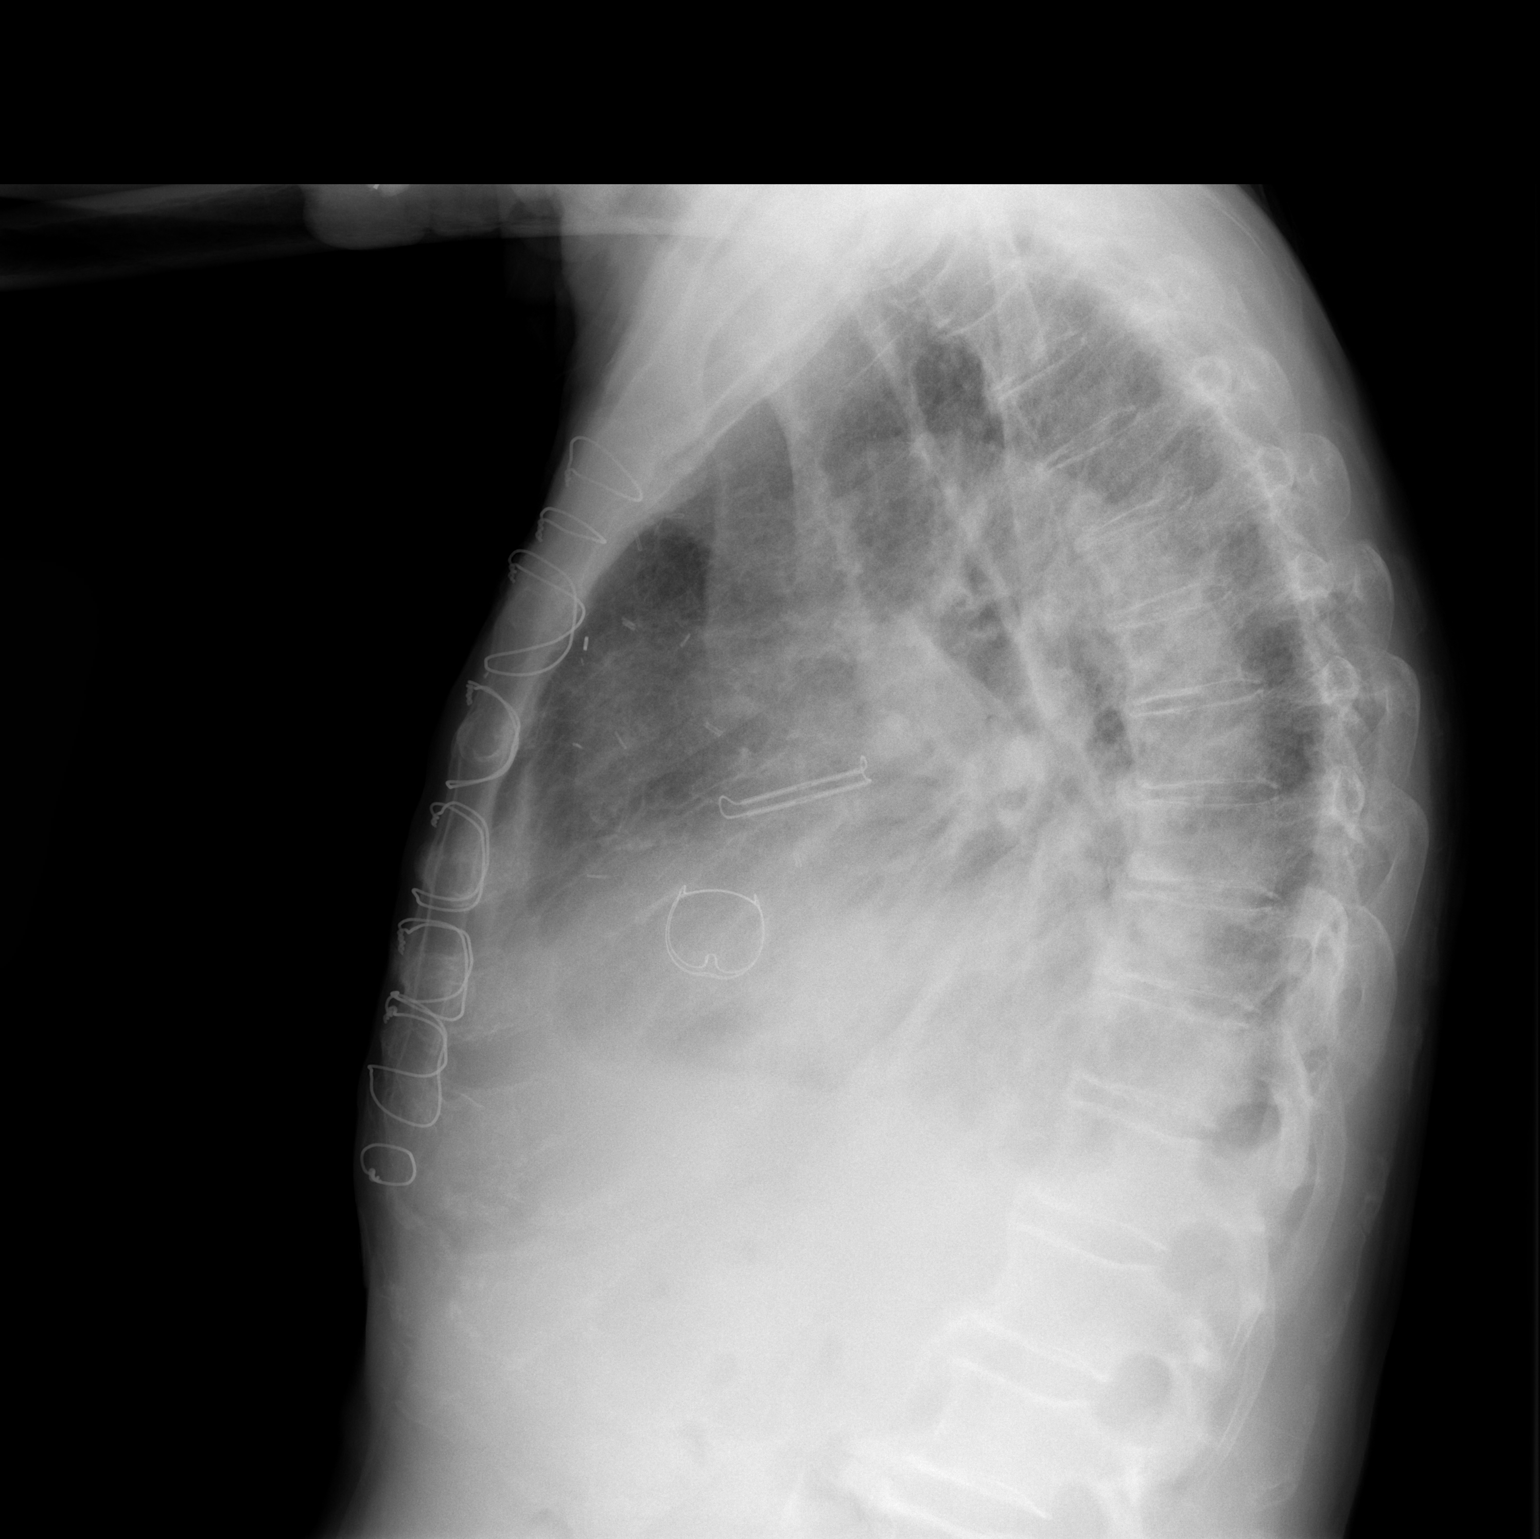

[2 of 2 positions shown; findings below may reference images not displayed]

FINDINGS: There are persistent pleural effusions bilaterally, larger on the
right than on the left. Right pleural effusion appears partially
loculated. There is scarring in the right mid lung region. There is
atelectatic change in the right base. There is no consolidation.

There is cardiomegaly with pulmonary vascularity normal. Patient is
status post coronary artery bypass grafting. There is an aortic
valve replacement with a left atrial appendage clamp. No adenopathy.
No bone lesions.
IMPRESSION: Persistent partially loculated pleural effusion right base. Much
smaller left pleural effusion. Atelectasis right base. Mild scarring
right mid lung. No frank edema or consolidation evident.

Stable cardiac silhouette. Postoperative changes noted. No
adenopathy evident.

## 2022-01-14 IMAGING — CR DG CHEST 2V
2 series · 2 of 2 positions shown · non-contrast
Comparison: Prior radiograph from 07/07/2020.

CLINICAL DATA: Initial evaluation for acute chest pain.

EXAM:
CHEST - 2 VIEW

[chest lat]
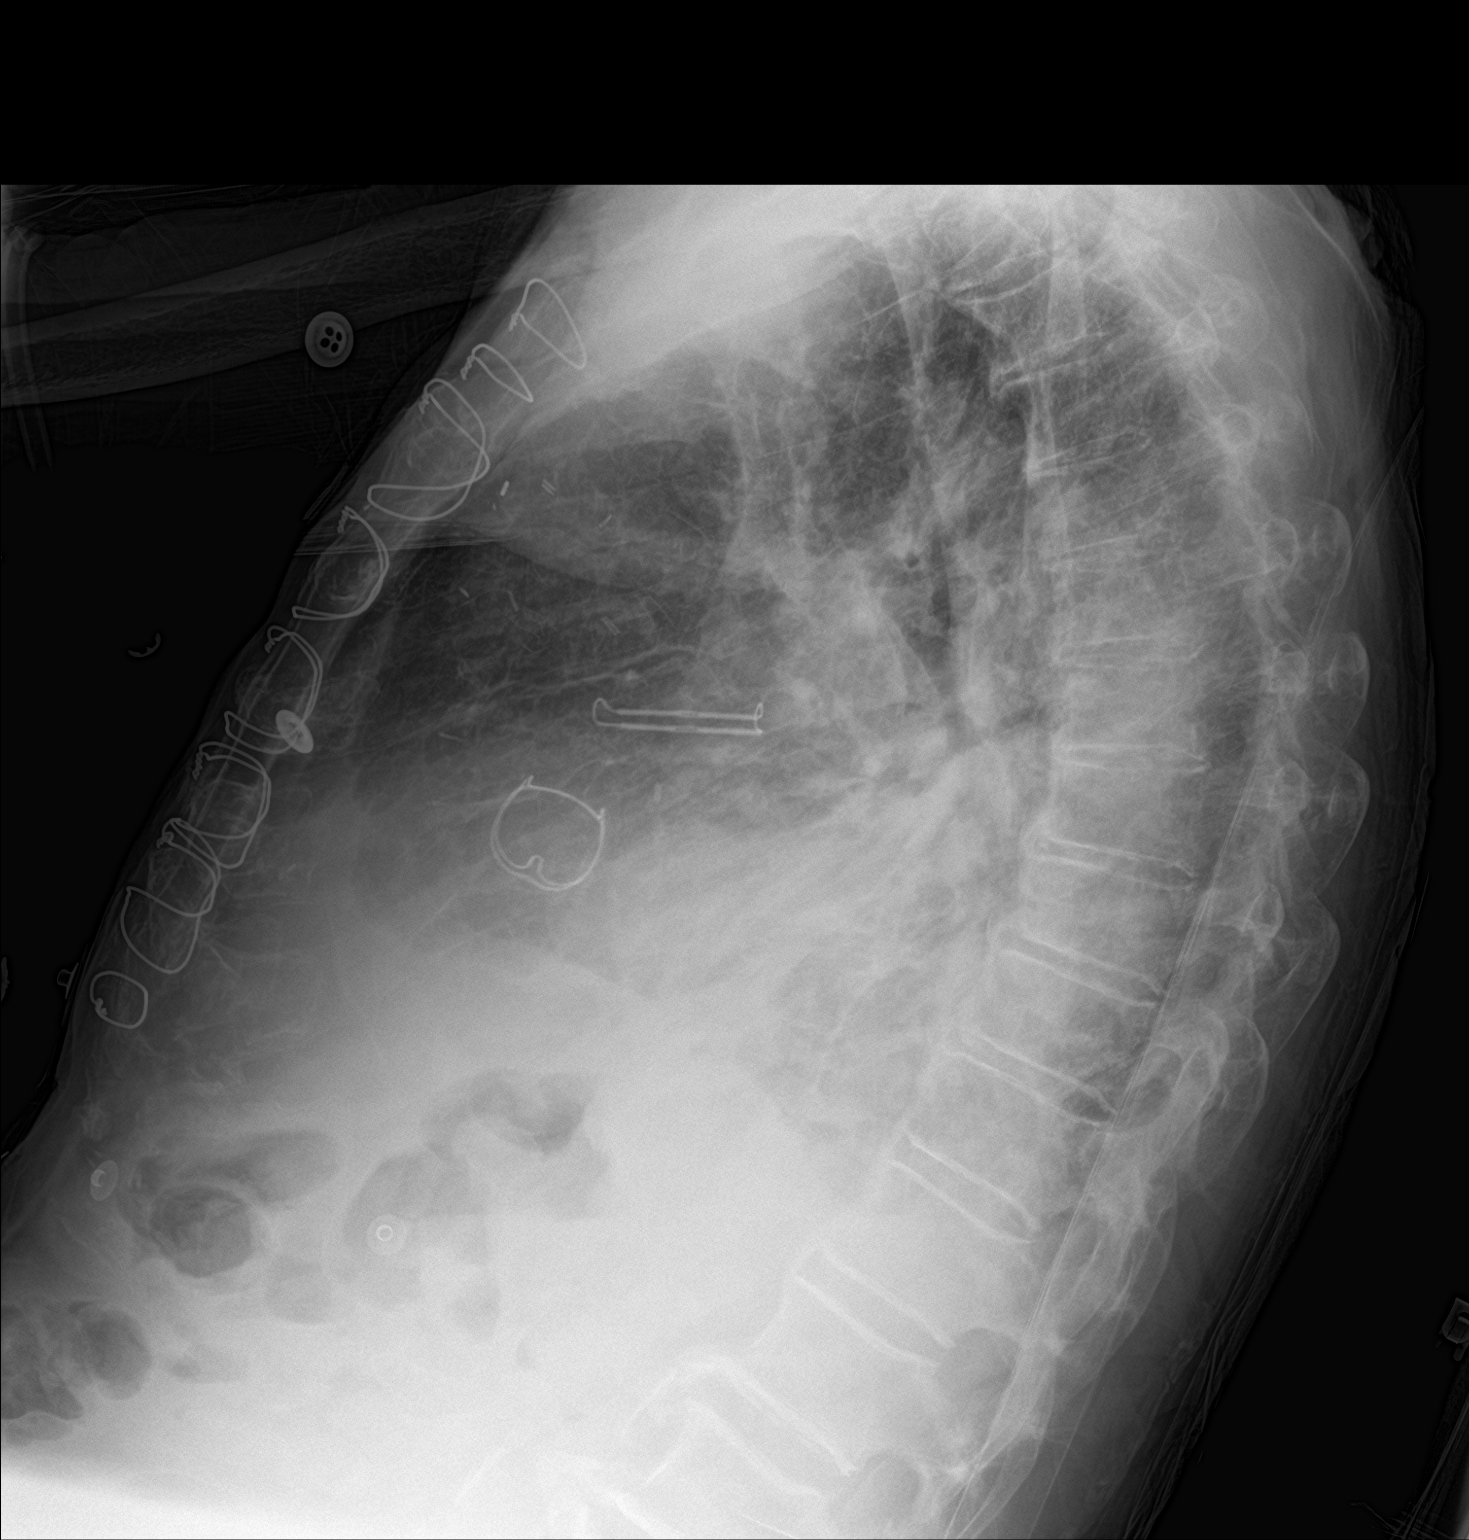

[chest ap]
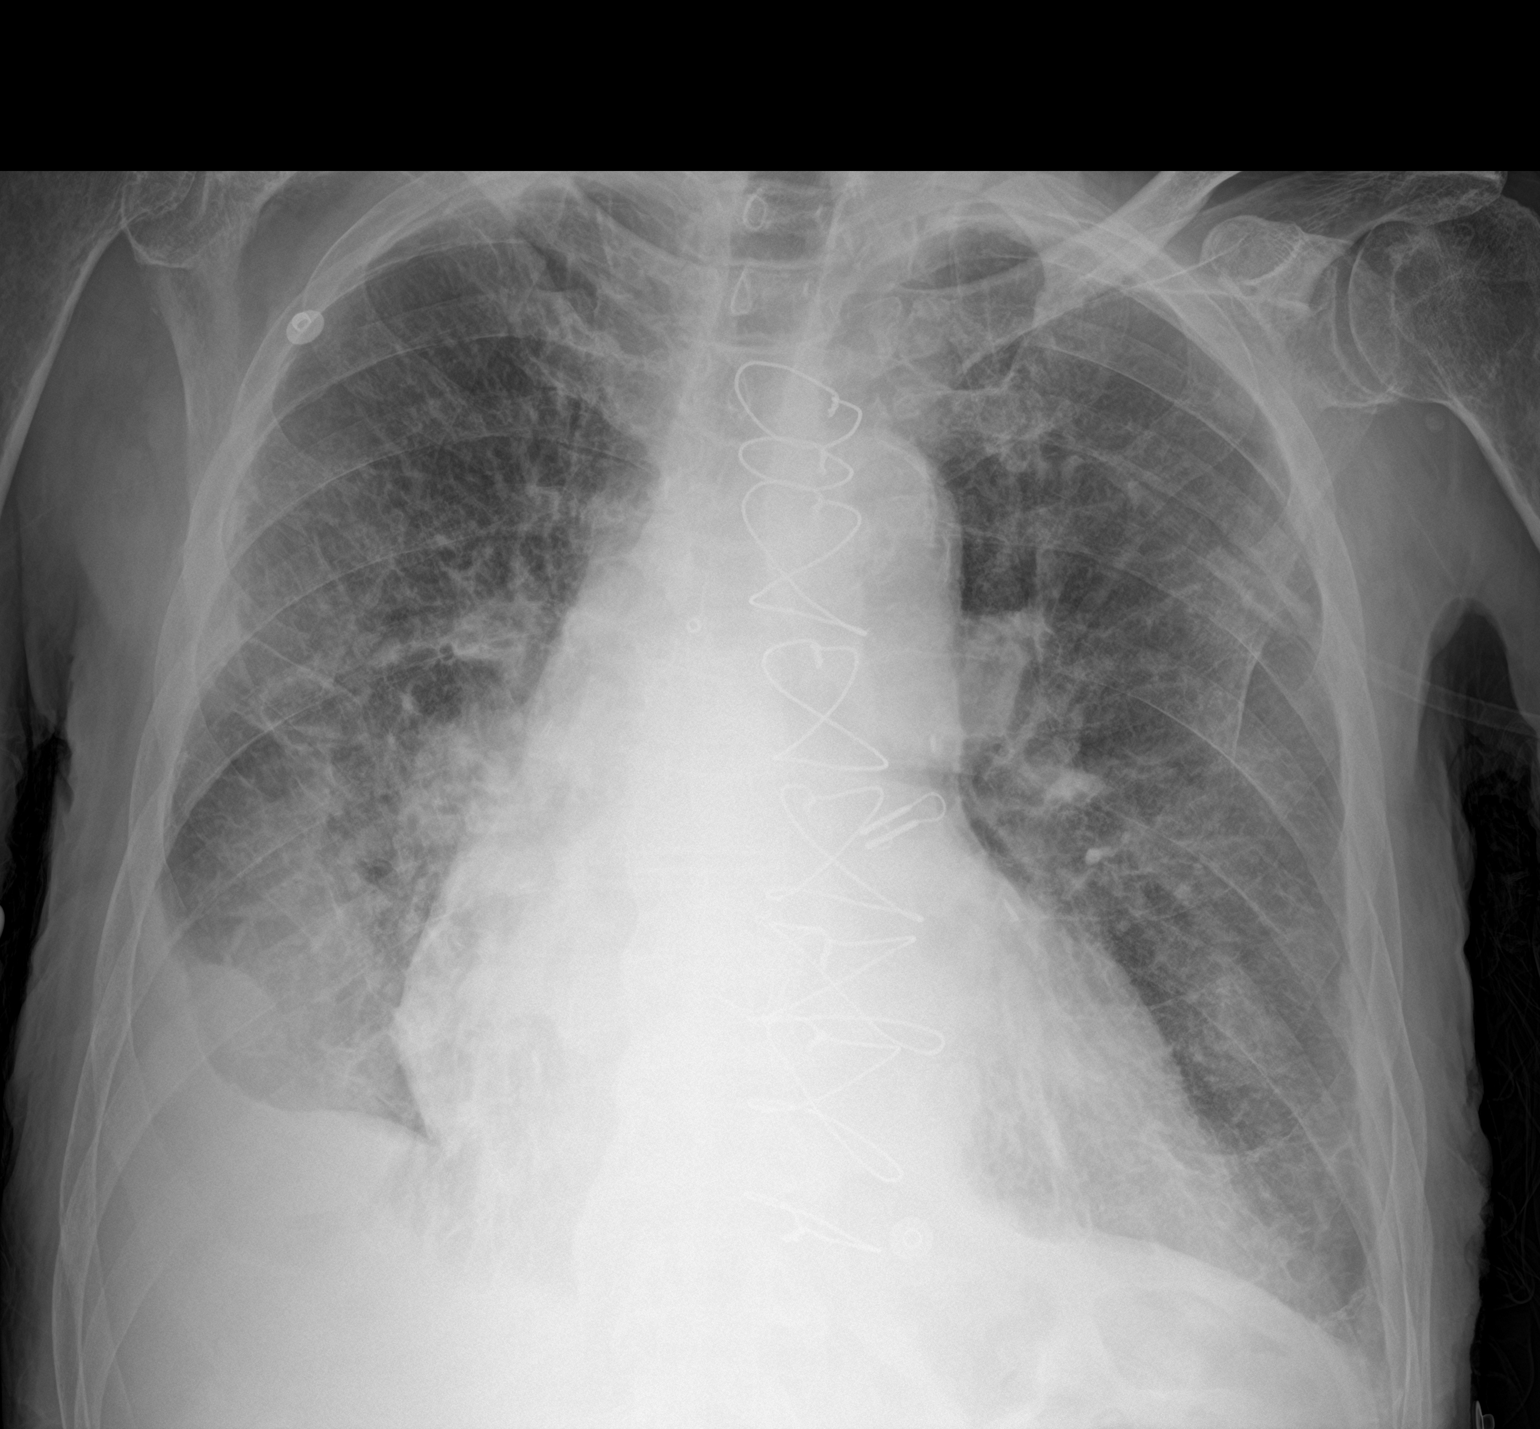

[2 of 2 positions shown; findings below may reference images not displayed]

FINDINGS: Median sternotomy wires with underlying valvular replacement,
surgical clips, and left atrial appendage clip. Cardiomegaly,
stable. Mediastinal silhouette within normal limits. Aortic
atherosclerosis.

Lungs normally inflated. Blunting of the right costophrenic angle
suggestive of a small effusion. Diffuse vascular and interstitial
prominence most consistent with pulmonary interstitial edema.
Superimposed more hazy opacity at the right lung base favored to
reflect edema and/or effusion. No definite focal infiltrates. No
pneumothorax.

No acute osseous finding.
IMPRESSION: 1. Cardiomegaly with diffuse pulmonary interstitial edema and small
right pleural effusion.
2. Superimposed more hazy opacity at the right lung base, favored to
reflect congestion and/or effusion, although infiltrate could be
considered in the correct clinical setting.

## 2022-03-21 ENCOUNTER — Non-Acute Institutional Stay: Payer: Medicare Other | Admitting: Hospice

## 2022-03-21 ENCOUNTER — Non-Acute Institutional Stay: Payer: Medicare Other | Admitting: *Deleted

## 2022-03-21 DIAGNOSIS — K5901 Slow transit constipation: Secondary | ICD-10-CM

## 2022-03-21 DIAGNOSIS — Z515 Encounter for palliative care: Secondary | ICD-10-CM

## 2022-03-21 DIAGNOSIS — I5042 Chronic combined systolic (congestive) and diastolic (congestive) heart failure: Secondary | ICD-10-CM

## 2022-03-21 DIAGNOSIS — I69151 Hemiplegia and hemiparesis following nontraumatic intracerebral hemorrhage affecting right dominant side: Secondary | ICD-10-CM

## 2022-03-21 NOTE — Progress Notes (Signed)
Therapist, nutritional Palliative Care Consult Note Telephone: [phone]  Fax: [phone]  PATIENT NAME: Carlos Welch DOB: 1946/08/03 MRN: [id number]  PRIMARY CARE PROVIDER:   Marisue Brooklyn SaguierKateri Mc [address] [place],  [place] [address]  REFERRING PROVIDER: Marisue Brooklyn SaguierRamon Dredge, PA-C [address] [place],  [place] [address]  RESPONSIBLE PARTY:  Self Contact Information     Name Relation Home Work Mobile   Hearn,Heather Daughter   [phone]      TELEHEALTH VISIT STATEMENT Due to the COVID-19 crisis, this visit was done via telemedicine from my office and it was initiated and consent by this patient and or family.  I connected with patient OR PROXY by a telephone/video  and verified that I am speaking with the correct person. I discussed the limitations of evaluation and management by telemedicine. Patient/proxy expressed understanding and agreed to proceed. Palliative Care was asked to follow this patient to address advance care planning, complex medical decision making and goals of care clarification.  Monishia RN is in person with patient seeing patient/facilitating video call.   Visit is to build trust and highlight Palliative Medicine as specialized medical care for people living with serious illness, aimed at facilitating better quality of life through symptoms relief, assisting with advance care planning and complex medical decision making. This is a follow up visit.  RECOMMENDATIONS/PLAN:   Advance Care Planning/Code Status: Patient is a full code  Goals of Care: Goals of care include to maximize quality of life and symptom management. Palliative care team will continue to support patient, patient's family, and medical team.  Symptom management/Plan:  Hemiplegia/hemiparesis: R side. Mostly gets around in his wheelchair, can take few steps with rolling walker. Fall precautions.  Participate in facility restorative exercises. Balance of rest and performance activity.   CHF: Stable. Continue Lasix as ordered. Elevate BLE during the day as much as possible to promote circulation. Adhere to Fluid and salt limits. Monitor weight closely and report weight gain of 3 Ibs in a day or 5 Ibs in a week.   Constipation: Stable. Managed with Senekot S.  Follow up: Palliative care will continue to follow for complex medical decision making, advance care planning, and clarification of goals. Return 6 weeks or prn. Encouraged to call provider sooner with any concerns.  CHIEF COMPLAINT: Palliative follow up visit HISTORY OF PRESENT ILLNESS:  Carlos Welch a 76 y.o. 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 History obtained from review of EMR, discussion with primary team, family and/or patient. Records reviewed and summarized above. All 10 point systems reviewed and are negative except as documented in history of present illness above  Review and summarization of Epic records shows history from other than patient.   Palliative Care was asked to follow this patient o help address complex decision making in the context of advance care planning and goals of care clarification. I reviewed, as needed, available labs, patient records, imaging, studies and related documents from the EMR.    PERTINENT MEDICATIONS:  Outpatient Encounter Medications as of 03/21/2022  Medication Sig   acetaminophen (TYLENOL) 325 MG tablet Take 2 tablets (650 mg total) by mouth every 4 (four) hours as needed for mild pain (or temp > 37.5 C (99.5 F)).   furosemide (LASIX) 20 MG tablet Take 1 tablet (20 mg  total) by mouth daily.   gabapentin (NEURONTIN) 100 MG capsule Take 1 capsule (100 mg total) by mouth 3 (three) times daily.   hydrALAZINE (APRESOLINE) 25 MG tablet Take 1  tablet (25 mg total) by mouth every 8 (eight) hours.   losartan (COZAAR) 50 MG tablet Take 1 tablet (50 mg total) by mouth daily.   pantoprazole (PROTONIX) 40 MG tablet Take 1 tablet (40 mg total) by mouth daily.   senna-docusate (SENOKOT-S) 8.6-50 MG tablet Take 1 tablet by mouth at bedtime.   tamsulosin (FLOMAX) 0.4 MG CAPS capsule Take 1 capsule (0.4 mg total) by mouth daily after supper.   traMADol (ULTRAM) 50 MG tablet Take 1 tablet (50 mg total) by mouth every 6 (six) hours as needed for severe pain or moderate pain.   No facility-administered encounter medications on file as of 03/21/2022.    HOSPICE ELIGIBILITY/DIAGNOSIS: TBD  PAST MEDICAL HISTORY:  Past Medical History:  Diagnosis Date   A-fib ([place])    Abscess of jaw 07/19/2020   Acute on chronic heart failure with preserved ejection fraction ([place]) 07/08/2020   Aortic stenosis    a. s/p pericardial AVR 2015.   Aortic valve stenosis 07/19/2020   Bacteremia 07/19/2020   Bacterial endocarditis    BPH (benign prostatic hyperplasia) 07/08/2020   CAD (coronary artery disease)    a. s/p CABG, AVR, LAA clipping 2015 at [facility].   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/08/2020   Elevated troponin 07/08/2020   Essential hypertension 07/08/2020   Generalized abdominal pain    History of noncompliance with medical treatment    Hyperlipemia 07/08/2020   Infection associated with implant ([place]) 07/19/2020   Malnutrition ([place])    MRSA bacteremia    Nonrheumatic mitral valve regurgitation 07/19/2020   Normocytic anemia 07/08/2020   Pressure injury of skin 05/12/2020   S/P aortic  valve replacement with bioprosthetic valve 2015   Severe malnutrition ([place]) 07/19/2020   Thrombocytopenia ([place]) 07/08/2020   TIA (transient ischemic attack)    Weight loss 07/19/2020      ALLERGIES:  Allergies  Allergen Reactions   Chocolate Rash    9.9.2022 Daughter reports that the patient regularly eats chocolate without any issues.   Peanut-Containing Drug Products Rash      I spent 45 minutes providing this consultation; this includes time spent with patient/family, chart review and documentation. More than 50% of the time in this consultation was spent on counseling and coordinating communication   Thank you for the opportunity to participate in the care of Boswell Croom Please call our office at [phone] if we can be of additional assistance.  Note: Portions of this note were generated with Lobbyist. Dictation errors may occur despite best attempts at proofreading.  Teodoro Spray, NP

## 2022-03-21 NOTE — Progress Notes (Signed)
[facility] COMMUNITY PALLIATIVE CARE RN NOTE  PATIENT NAME: Carlos Welch DOB: 1946/09/27 MRN: [id number]  PRIMARY CARE PROVIDER: [name], PA-C  RESPONSIBLE PARTY: Blanchie Serve (daughter) Acct ID - Guarantor Home Phone Work Phone Relationship Acct Type  [id number] [employer], [employer] [phone]  Self P/F     [address], [place], [place] [address]    RN face to face visit completed with patient in the hallway near his room at the facility [facility]). Conferenced in [employer] NP via video. See NP note for complete visit details.    (Duration of visit and documentation 30 minutes)   Candiss Norse, RN BSN

## 2022-04-20 ENCOUNTER — Emergency Department (HOSPITAL_COMMUNITY): Payer: Medicare Other

## 2022-04-20 ENCOUNTER — Non-Acute Institutional Stay: Payer: Medicare Other | Admitting: Hospice

## 2022-04-20 ENCOUNTER — Observation Stay (HOSPITAL_COMMUNITY): Payer: Medicare Other

## 2022-04-20 ENCOUNTER — Encounter (HOSPITAL_COMMUNITY): Payer: Self-pay | Admitting: Emergency Medicine

## 2022-04-20 ENCOUNTER — Inpatient Hospital Stay (HOSPITAL_COMMUNITY)
Admission: EM | Admit: 2022-04-20 | Discharge: 2022-04-24 | DRG: 194 | Disposition: A | Discharge status: Skilled Nursing Facility | Payer: Medicare Other | Source: Skilled Nursing Facility | Attending: Family Medicine | Admitting: Family Medicine

## 2022-04-20 ENCOUNTER — Other Ambulatory Visit: Payer: Self-pay

## 2022-04-20 DIAGNOSIS — E785 Hyperlipidemia, unspecified: Secondary | ICD-10-CM | POA: Diagnosis present

## 2022-04-20 DIAGNOSIS — I25810 Atherosclerosis of coronary artery bypass graft(s) without angina pectoris: Secondary | ICD-10-CM | POA: Diagnosis present

## 2022-04-20 DIAGNOSIS — I2510 Atherosclerotic heart disease of native coronary artery without angina pectoris: Secondary | ICD-10-CM | POA: Diagnosis present

## 2022-04-20 DIAGNOSIS — Z953 Presence of xenogenic heart valve: Secondary | ICD-10-CM

## 2022-04-20 DIAGNOSIS — Z87891 Personal history of nicotine dependence: Secondary | ICD-10-CM

## 2022-04-20 DIAGNOSIS — Z951 Presence of aortocoronary bypass graft: Secondary | ICD-10-CM

## 2022-04-20 DIAGNOSIS — J189 Pneumonia, unspecified organism: Principal | ICD-10-CM

## 2022-04-20 DIAGNOSIS — Z20822 Contact with and (suspected) exposure to covid-19: Secondary | ICD-10-CM | POA: Diagnosis present

## 2022-04-20 DIAGNOSIS — N400 Enlarged prostate without lower urinary tract symptoms: Secondary | ICD-10-CM | POA: Diagnosis present

## 2022-04-20 DIAGNOSIS — I5042 Chronic combined systolic (congestive) and diastolic (congestive) heart failure: Secondary | ICD-10-CM | POA: Diagnosis present

## 2022-04-20 DIAGNOSIS — I482 Chronic atrial fibrillation, unspecified: Secondary | ICD-10-CM | POA: Diagnosis present

## 2022-04-20 DIAGNOSIS — D696 Thrombocytopenia, unspecified: Secondary | ICD-10-CM | POA: Diagnosis present

## 2022-04-20 DIAGNOSIS — I10 Essential (primary) hypertension: Secondary | ICD-10-CM | POA: Diagnosis present

## 2022-04-20 DIAGNOSIS — Z9102 Food additives allergy status: Secondary | ICD-10-CM

## 2022-04-20 DIAGNOSIS — I619 Nontraumatic intracerebral hemorrhage, unspecified: Secondary | ICD-10-CM | POA: Diagnosis present

## 2022-04-20 DIAGNOSIS — Z79899 Other long term (current) drug therapy: Secondary | ICD-10-CM

## 2022-04-20 DIAGNOSIS — Y95 Nosocomial condition: Secondary | ICD-10-CM | POA: Diagnosis present

## 2022-04-20 DIAGNOSIS — R1310 Dysphagia, unspecified: Secondary | ICD-10-CM | POA: Diagnosis present

## 2022-04-20 DIAGNOSIS — Z91018 Allergy to other foods: Secondary | ICD-10-CM

## 2022-04-20 DIAGNOSIS — I110 Hypertensive heart disease with heart failure: Secondary | ICD-10-CM | POA: Diagnosis present

## 2022-04-20 DIAGNOSIS — I69351 Hemiplegia and hemiparesis following cerebral infarction affecting right dominant side: Secondary | ICD-10-CM

## 2022-04-20 LAB — CBC WITH DIFFERENTIAL/PLATELET
Abs Immature Granulocytes: 0.03 10*3/uL (ref 0.00–0.07)
Basophils Absolute: 0.1 10*3/uL (ref 0.0–0.1)
Basophils Relative: 1 %
Eosinophils Absolute: 0.1 10*3/uL (ref 0.0–0.5)
Eosinophils Relative: 1 %
HCT: 48 % (ref 39.0–52.0)
Hemoglobin: 15.5 g/dL (ref 13.0–17.0)
Immature Granulocytes: 0 %
Lymphocytes Relative: 6 %
Lymphs Abs: 0.5 10*3/uL — ABNORMAL LOW (ref 0.7–4.0)
MCH: 29.9 pg (ref 26.0–34.0)
MCHC: 32.3 g/dL (ref 30.0–36.0)
MCV: 92.7 fL (ref 80.0–100.0)
Monocytes Absolute: 0.6 10*3/uL (ref 0.1–1.0)
Monocytes Relative: 6 %
Neutro Abs: 7.8 10*3/uL — ABNORMAL HIGH (ref 1.7–7.7)
Neutrophils Relative %: 86 %
Platelets: 116 10*3/uL — ABNORMAL LOW (ref 150–400)
RBC: 5.18 MIL/uL (ref 4.22–5.81)
RDW: 14.2 % (ref 11.5–15.5)
WBC: 9.1 10*3/uL (ref 4.0–10.5)
nRBC: 0 % (ref 0.0–0.2)

## 2022-04-20 LAB — COMPREHENSIVE METABOLIC PANEL
ALT: 9 U/L (ref 0–44)
AST: 20 U/L (ref 15–41)
Albumin: 3.7 g/dL (ref 3.5–5.0)
Alkaline Phosphatase: 74 U/L (ref 38–126)
Anion gap: 12 (ref 5–15)
BUN: 19 mg/dL (ref 8–23)
CO2: 23 mmol/L (ref 22–32)
Calcium: 8.9 mg/dL (ref 8.9–10.3)
Chloride: 105 mmol/L (ref 98–111)
Creatinine, Ser: 1.21 mg/dL (ref 0.61–1.24)
GFR, Estimated: >60 mL/min (ref >60)
Glucose, Bld: 91 mg/dL (ref 70–99)
Potassium: 3.6 mmol/L (ref 3.5–5.1)
Sodium: 140 mmol/L (ref 135–145)
Total Bilirubin: 1 mg/dL (ref 0.3–1.2)
Total Protein: 6.7 g/dL (ref 6.5–8.1)

## 2022-04-20 LAB — GLUCOSE, CAPILLARY: Glucose-Capillary: 114 mg/dL — ABNORMAL HIGH (ref 70–99)

## 2022-04-20 LAB — TROPONIN I (HIGH SENSITIVITY)
Troponin I (High Sensitivity): 7 ng/L (ref <18)
Troponin I (High Sensitivity): 17 ng/L (ref <18)

## 2022-04-20 LAB — LACTIC ACID, PLASMA
Lactic Acid, Venous: 2 mmol/L (ref 0.5–1.9)
Lactic Acid, Venous: 1.4 mmol/L (ref 0.5–1.9)

## 2022-04-20 LAB — PROCALCITONIN: Procalcitonin: 1.35 ng/mL

## 2022-04-20 LAB — ETHANOL: Alcohol, Ethyl (B): <10 mg/dL (ref <10)

## 2022-04-20 LAB — CK: Total CK: 39 U/L — ABNORMAL LOW (ref 49–397)

## 2022-04-20 LAB — BRAIN NATRIURETIC PEPTIDE: B Natriuretic Peptide: 125.4 pg/mL — ABNORMAL HIGH (ref 0.0–100.0)

## 2022-04-20 MED ORDER — ACETAMINOPHEN 325 MG PO TABS
650.0000 mg | ORAL_TABLET | Freq: Four times a day (QID) | ORAL | Status: DC | PRN
  Administered 2022-04-21: 650 mg via ORAL
  Filled 2022-04-20: qty 2

## 2022-04-20 MED ORDER — SODIUM CHLORIDE 0.9 % IV SOLN: 250.0000 mL | INTRAVENOUS | Status: DC | PRN

## 2022-04-20 MED ORDER — FAMOTIDINE 20 MG PO TABS
10.0000 mg | ORAL_TABLET | Freq: Every day | ORAL | Status: DC
  Administered 2022-04-20 – 2022-04-24 (×5): 10 mg via ORAL
  Filled 2022-04-20 (×5): qty 1

## 2022-04-20 MED ORDER — ENOXAPARIN SODIUM 40 MG/0.4ML IJ SOSY
40.0000 mg | PREFILLED_SYRINGE | INTRAMUSCULAR | Status: DC
Start: 2022-04-20 — End: 2022-04-24
  Administered 2022-04-20 – 2022-04-23 (×4): 40 mg via SUBCUTANEOUS
  Filled 2022-04-20 (×4): qty 0.4

## 2022-04-20 MED ORDER — SODIUM CHLORIDE 0.9 % IV SOLN
500.0000 mg | INTRAVENOUS | Status: DC
  Administered 2022-04-21 – 2022-04-22 (×2): 500 mg via INTRAVENOUS
  Filled 2022-04-20 (×3): qty 5

## 2022-04-20 MED ORDER — TAMSULOSIN HCL 0.4 MG PO CAPS
0.4000 mg | ORAL_CAPSULE | Freq: Every day | ORAL | Status: DC
  Administered 2022-04-20 – 2022-04-23 (×4): 0.4 mg via ORAL
  Filled 2022-04-20 (×4): qty 1

## 2022-04-20 MED ORDER — LOSARTAN POTASSIUM 50 MG PO TABS
50.0000 mg | ORAL_TABLET | Freq: Every day | ORAL | Status: DC
  Administered 2022-04-21 – 2022-04-24 (×4): 50 mg via ORAL
  Filled 2022-04-20 (×4): qty 1

## 2022-04-20 MED ORDER — SODIUM CHLORIDE 0.9 % IV SOLN
2.0000 g | INTRAVENOUS | Status: DC
  Administered 2022-04-21 – 2022-04-24 (×4): 2 g via INTRAVENOUS
  Filled 2022-04-20 (×4): qty 20

## 2022-04-20 MED ORDER — AZITHROMYCIN 500 MG IV SOLR
500.0000 mg | Freq: Once | INTRAVENOUS | Status: AC
  Administered 2022-04-20: 500 mg via INTRAVENOUS
  Filled 2022-04-20: qty 5

## 2022-04-20 MED ORDER — ACETAMINOPHEN 650 MG RE SUPP: 650.0000 mg | Freq: Four times a day (QID) | RECTAL | Status: DC | PRN

## 2022-04-20 MED ORDER — DICLOFENAC SODIUM 1 % EX GEL
2.0000 g | Freq: Three times a day (TID) | CUTANEOUS | Status: DC
  Administered 2022-04-20 – 2022-04-23 (×10): 2 g via TOPICAL
  Filled 2022-04-20: qty 100

## 2022-04-20 MED ORDER — ATORVASTATIN CALCIUM 10 MG PO TABS
10.0000 mg | ORAL_TABLET | Freq: Every day | ORAL | Status: DC
  Administered 2022-04-20 – 2022-04-24 (×5): 10 mg via ORAL
  Filled 2022-04-20 (×5): qty 1

## 2022-04-20 MED ORDER — ONDANSETRON HCL 4 MG PO TABS: 4.0000 mg | ORAL_TABLET | Freq: Four times a day (QID) | ORAL | Status: DC | PRN

## 2022-04-20 MED ORDER — SODIUM CHLORIDE 0.9 % IV SOLN
1.0000 g | Freq: Once | INTRAVENOUS | Status: AC
  Administered 2022-04-20: 1 g via INTRAVENOUS
  Filled 2022-04-20: qty 10

## 2022-04-20 MED ORDER — ONDANSETRON HCL 4 MG/2ML IJ SOLN: 4.0000 mg | Freq: Four times a day (QID) | INTRAMUSCULAR | Status: DC | PRN

## 2022-04-20 MED ORDER — SODIUM CHLORIDE 0.9 % IV BOLUS
1000.0000 mL | Freq: Once | INTRAVENOUS | Status: AC
  Administered 2022-04-20: 1000 mL via INTRAVENOUS

## 2022-04-20 MED ORDER — ONDANSETRON 4 MG PO TBDP: 4.0000 mg | ORAL_TABLET | Freq: Once | ORAL | Status: DC

## 2022-04-20 MED ORDER — SENNOSIDES-DOCUSATE SODIUM 8.6-50 MG PO TABS
1.0000 | ORAL_TABLET | Freq: Every day | ORAL | Status: DC
Start: 2022-04-20 — End: 2022-04-24
  Administered 2022-04-20: 1 via ORAL
  Filled 2022-04-20 (×3): qty 1

## 2022-04-20 MED ORDER — HYDRALAZINE HCL 25 MG PO TABS
25.0000 mg | ORAL_TABLET | Freq: Three times a day (TID) | ORAL | Status: DC
  Administered 2022-04-20 – 2022-04-24 (×11): 25 mg via ORAL
  Filled 2022-04-20 (×12): qty 1

## 2022-04-20 MED ORDER — TRAMADOL HCL 50 MG PO TABS
50.0000 mg | ORAL_TABLET | Freq: Two times a day (BID) | ORAL | Status: DC | PRN
  Administered 2022-04-20 – 2022-04-23 (×4): 50 mg via ORAL
  Filled 2022-04-20 (×4): qty 1

## 2022-04-20 MED ORDER — SODIUM CHLORIDE 0.9% FLUSH: 3.0000 mL | INTRAVENOUS | Status: DC | PRN

## 2022-04-20 MED ORDER — GUAIFENESIN-DM 100-10 MG/5ML PO SYRP
5.0000 mL | ORAL_SOLUTION | ORAL | Status: DC | PRN
Start: 2022-04-20 — End: 2022-04-24
  Filled 2022-04-20: qty 10

## 2022-04-20 MED ORDER — SODIUM CHLORIDE 0.9% FLUSH
3.0000 mL | Freq: Two times a day (BID) | INTRAVENOUS | Status: DC
Start: 2022-04-20 — End: 2022-04-24
  Administered 2022-04-20 – 2022-04-24 (×7): 3 mL via INTRAVENOUS

## 2022-04-20 MED ORDER — GABAPENTIN 100 MG PO CAPS
100.0000 mg | ORAL_CAPSULE | Freq: Three times a day (TID) | ORAL | Status: DC
  Administered 2022-04-20 – 2022-04-24 (×11): 100 mg via ORAL
  Filled 2022-04-20 (×13): qty 1

## 2022-04-20 MED ORDER — FUROSEMIDE 20 MG PO TABS
20.0000 mg | ORAL_TABLET | Freq: Every day | ORAL | Status: DC
  Administered 2022-04-20 – 2022-04-24 (×5): 20 mg via ORAL
  Filled 2022-04-20 (×5): qty 1

## 2022-04-20 NOTE — Assessment & Plan Note (Addendum)
76 year old presenting with complaints of generalized pain, shortness of breath and cough found to have RUL pneumonia.  -CURB 65 score of 2, place in obs on telemetry -continue rocephin/azithromycin with low threshold to broaden if needed -This appears to be most likely HCAP since resides in SNF.  -Other etiologies include aspiration with hx of dysphagia, cough post swallow and resolved confusion vs. Viral.  -aspiration precautions and SLP eval as well as RT eval  -blood cultures pending -lactic acid -check RVP and covid -PCT  -robitussin for cough -IS to bedside

## 2022-04-20 NOTE — Assessment & Plan Note (Signed)
No signs of overload on exam. History of repetitive fluid overload due to noncompliance with medication;however, since being in SNF he has maintained his euvolemic state -strict I/O -last echo: normal EF, indeterminate diastolic function, RVF moderately reduced, severe MR. Normal aortic valve prosthetic

## 2022-04-20 NOTE — Assessment & Plan Note (Signed)
CHA2D2s-vasc score of 6 eliquis held after ICH in 06/2021; however, her has never followed up with cardiology to see about starting this back up I discussed with daughter and she confirms they have not been to cardiology Would recommend further discussion and discuss with cards due to high risk of stroke Continue VTE prophylaxis for now  On no rate controlling agent

## 2022-04-20 NOTE — ED Notes (Signed)
Admitting provider at bedside.

## 2022-04-20 NOTE — ED Notes (Signed)
Pt called out stating his chest pain was back.  Triage RN and Provider bedside EKG re-ordered.

## 2022-04-20 NOTE — Assessment & Plan Note (Signed)
Continue lipitor 10 mg

## 2022-04-20 NOTE — Assessment & Plan Note (Signed)
-  in SNF -continue blood pressure control -discuss starting back eliquis, per neurology notes in 08/2021 had advised patient to return to cardiology to discuss starting this back and he has not been back to see his cardiologist.

## 2022-04-20 NOTE — ED Notes (Signed)
..ED TO INPATIENT HANDOFF REPORT  ED Nurse Name and Phone #: Tobi Bastos [phone]  S Name/Age/Gender Carlos Welch 76 y.o. male Room/Bed: H020C/H020C  Code Status   Code Status: Full Code  Home/SNF/Other Nursing Home Patient oriented to: self, place, time, and situation Is this baseline? Yes   Triage Complete: Triage complete  Chief Complaint HCAP (healthcare-associated pneumonia) [J18.9]  Triage Note Per EMS, pt from [facility] has "shaking" since 2am on the 5th.  Now c/o of muscle tightness in arms and chest, and has swelling to the eyes all started tonight.  Pt has some SOB as well.  Pt able to speak in complete sentences, no respiratory distress noted.   benadryl IV given 92 heart rate, regular 94% on RA 18G L AC 156/102   Allergies Allergies  Allergen Reactions   Chocolate Rash    9.9.2022 Daughter reports that the patient regularly eats chocolate without any issues.   Peanut-Containing Drug Products Rash    Level of Care/Admitting Diagnosis ED Disposition     ED Disposition  Admit   Condition  --   Comment  Hospital [facility]: [facility] [[id number]]  Level of Care: Telemetry Medical [104]  May place patient in observation at [facility] or [facility] if equivalent level of care is available:: Yes  Covid Evaluation: Symptomatic Person Under Investigation (PUI) or recent exposure (last 10 days) *Testing Required*  Diagnosis: HCAP (healthcare-associated pneumonia) [454098]  Admitting Physician: Orland Mustard [[id number]]  Attending Physician: Orland Mustard [[id number]]          B Medical/Surgery History Past Medical History:  Diagnosis Date   A-fib (HCC)    Abscess of jaw 07/19/2020   Acute on chronic heart failure with preserved ejection fraction (HCC) 07/08/2020   Aortic stenosis    a. s/p pericardial AVR 2015.   Aortic valve stenosis 07/19/2020   Bacteremia 07/19/2020   Bacterial endocarditis    BPH (benign prostatic  hyperplasia) 07/08/2020   CAD (coronary artery disease)    a. s/p CABG, AVR, LAA clipping 2015 at [facility].   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/08/2020   Elevated troponin 07/08/2020   Essential hypertension 07/08/2020   Generalized abdominal pain    History of noncompliance with medical treatment    Hyperlipemia 07/08/2020   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   Malnutrition (HCC)    MRSA bacteremia    Nonrheumatic mitral valve regurgitation 07/19/2020   Normocytic anemia 07/08/2020   Pressure injury of skin 05/12/2020   S/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 2015   Severe malnutrition (HCC) 07/19/2020   Thrombocytopenia (HCC) 07/08/2020   TIA (transient ischemic attack)    Weight loss 07/19/2020   Past Surgical History:  Procedure Laterality Date   AORTIC VALVE REPLACEMENT     CORONARY ARTERY BYPASS GRAFT  2015   HERNIA REPAIR     REMOVAL OF IMPLANT  03/14/2020   TEE WITHOUT CARDIOVERSION N/A 05/13/2020   Procedure: TRANSESOPHAGEAL ECHOCARDIOGRAM (TEE);  Surgeon: Lewayne Bunting, MD;  Location: [facility] ENDOSCOPY;  Service: Cardiovascular;  Laterality: N/A;     A IV Location/Drains/Wounds Patient Lines/Drains/Airways Status  Active Line/Drains/Airways     Name Placement date Placement time Site Days   Peripheral IV 04/20/22 18 G Left Antecubital 04/20/22  0909  Antecubital  less than 1   Wound / Incision (Open or Dehisced) 07/10/21 Non-pressure wound;(MASD) Moisture Associated Skin Damage Sacrum Mid 2x2 in size 07/10/21  1930  Sacrum  284            Intake/Output Last 24  hours No intake or output data in the 24 hours ending 04/20/22 1924  Labs/Imaging Results for orders placed or performed during the hospital encounter of 04/20/22 (from the past 48 hour(s))  Comprehensive metabolic panel     Status: None   Collection Time: 04/20/22  5:18 AM  Result Value Ref Range   Sodium 140 135 - 145 mmol/L   Potassium 3.6 3.5 - 5.1 mmol/L   Chloride 105 98 - 111 mmol/L   CO2 23 22 - 32 mmol/L   Glucose, Bld 91 70 - 99 mg/dL    Comment: Glucose reference range applies only to samples taken after fasting for at least 8 hours.   BUN 19 8 - 23 mg/dL   Creatinine, Ser 1.611.21 0.61 - 1.24 mg/dL   Calcium 8.9 8.9 - 09.610.3 mg/dL   Total Protein 6.7 6.5 - 8.1 g/dL   Albumin 3.7 3.5 - 5.0 g/dL   AST 20 15 - 41 U/L   ALT 9 0 - 44 U/L   Alkaline Phosphatase 74 38 - 126 U/L   Total Bilirubin 1.0 0.3 - 1.2 mg/dL   GFR, Estimated >04>60 >54>60 mL/min    Comment: (NOTE) Calculated using the CKD-EPI Creatinine Equation (2021)    Anion gap 12 5 - 15    Comment: Performed at [facility], [address] [place], [place] [address]  Troponin I (High Sensitivity)     Status: None   Collection Time: 04/20/22  5:18 AM  Result Value Ref Range   Troponin I (High Sensitivity) 7 <18 ng/L    Comment: (NOTE) Elevated high sensitivity troponin I (hsTnI) values and significant  changes across serial measurements may suggest ACS but many other  chronic and acute conditions are known to elevate hsTnI results.  Refer to the "Links" section for chest pain algorithms and additional  guidance. Performed at [facility], [address] [place], [place] [address]   CBC with Differential     Status: Abnormal   Collection Time: 04/20/22  5:18 AM  Result Value Ref Range   WBC 9.1 4.0 - 10.5 K/uL   RBC 5.18 4.22 - 5.81 MIL/uL   Hemoglobin 15.5 13.0 - 17.0 g/dL   HCT 29.548.0 62.139.0 - 30.852.0 %   MCV 92.7 80.0 - 100.0 fL   MCH 29.9 26.0 - 34.0 pg   MCHC 32.3 30.0 - 36.0 g/dL   RDW 65.714.2 84.611.5 -  96.215.5 %   Platelets 116 (L) 150 - 400 K/uL    Comment: REPEATED TO VERIFY   nRBC 0.0 0.0 - 0.2 %   Neutrophils Relative % 86 %   Neutro Abs 7.8 (H) 1.7 - 7.7 K/uL   Lymphocytes Relative 6 %   Lymphs Abs 0.5 (L) 0.7 - 4.0 K/uL   Monocytes Relative 6 %   Monocytes Absolute 0.6 0.1 - 1.0 K/uL   Eosinophils Relative 1 %   Eosinophils Absolute 0.1 0.0 - 0.5 K/uL   Basophils Relative 1 %   Basophils Absolute 0.1 0.0 - 0.1 K/uL   Immature Granulocytes  0 %   Abs Immature Granulocytes 0.03 0.00 - 0.07 K/uL    Comment: Performed at [facility], [address], [place], [place] [address]  Troponin I (High Sensitivity)     Status: None   Collection Time: 04/20/22  7:09 AM  Result Value Ref Range   Troponin I (High Sensitivity) 17 <18 ng/L    Comment: (NOTE) Elevated high sensitivity troponin I (hsTnI) values and significant  changes across serial measurements may suggest ACS but many other  chronic and acute conditions are known to elevate hsTnI results.  Refer to the "Links" section for chest pain algorithms and additional  guidance. Performed at [facility], [address] [place], [place] [address]   Brain natriuretic peptide     Status: Abnormal   Collection Time: 04/20/22  8:56 AM  Result Value Ref Range   B Natriuretic Peptide 125.4 (H) 0.0 - 100.0 pg/mL    Comment: Performed at [facility], [address], [place], [place] [address]  Ethanol     Status: None   Collection Time: 04/20/22  8:56 AM  Result Value Ref Range   Alcohol, Ethyl (B) <10 <10 mg/dL    Comment: (NOTE) Lowest detectable limit for serum alcohol is 10 mg/dL.  For medical purposes only. Performed at [facility], [address], [place], [place] [address]   CK     Status: Abnormal   Collection Time: 04/20/22  8:56 AM  Result Value Ref Range   Total CK 39 (L) 49 - 397 U/L    Comment: Performed at [facility], [address] [place], [place] [address]  Lactic acid, plasma      Status: Abnormal   Collection Time: 04/20/22  3:40 PM  Result Value Ref Range   Lactic Acid, Venous 2.0 (HH) 0.5 - 1.9 mmol/L    Comment: CRITICAL RESULT CALLED TO, READ BACK BY AND VERIFIED WITH: A Elexa Kivi,RN 1706 04/20/2022 WBOND Performed at [facility], [address] [place], [place] [address]   Procalcitonin - Baseline     Status: None   Collection Time: 04/20/22  3:41 PM  Result Value Ref Range   Procalcitonin 1.35 ng/mL    Comment:        Interpretation: PCT > 0.5 ng/mL and <= 2 ng/mL: Systemic infection (sepsis) is possible, but other conditions are known to elevate PCT as well. (NOTE)       Sepsis PCT Algorithm           Lower Respiratory Tract                                      Infection PCT Algorithm    ----------------------------     ----------------------------         PCT < 0.25 ng/mL                PCT < 0.10 ng/mL          Strongly encourage             Strongly discourage   discontinuation of antibiotics    initiation of antibiotics    ----------------------------     -----------------------------       PCT 0.25 - 0.50 ng/mL            PCT 0.10 - 0.25 ng/mL  OR       >80% decrease in PCT            Discourage initiation of                                            antibiotics      Encourage discontinuation           of antibiotics    ----------------------------     -----------------------------         PCT >= 0.50 ng/mL              PCT 0.26 - 0.50 ng/mL                AND       <80% decrease in PCT             Encourage initiation of                                             antibiotics       Encourage continuation           of antibiotics    ----------------------------     -----------------------------        PCT >= 0.50 ng/mL                  PCT > 0.50 ng/mL               AND         increase in PCT                  Strongly encourage                                      initiation of antibiotics    Strongly encourage  escalation           of antibiotics                                     -----------------------------                                           PCT <= 0.25 ng/mL                                                 OR                                        > 80% decrease in PCT                                      Discontinue / Do not initiate  antibiotics  Performed at [facility], [address] [place], [place] [address]    CT Head Wo Contrast  Result Date: 04/20/2022 CLINICAL DATA:  Fall, mental status change. EXAM: CT HEAD WITHOUT CONTRAST TECHNIQUE: Contiguous axial images were obtained from the base of the skull through the vertex without intravenous contrast. RADIATION DOSE REDUCTION: This exam was performed according to the departmental dose-optimization program which includes automated exposure control, adjustment of the mA and/or kV according to patient size and/or use of iterative reconstruction technique. COMPARISON:  CT examination dated June 30, 2021 FINDINGS: Brain: No evidence of acute infarction, hemorrhage, hydrocephalus, extra-axial collection or mass lesion/mass effect. Diffuse low-attenuation of the periventricular and subcortical white matter presumed chronic microvascular ischemic changes. Moderate cerebral volume loss. Vascular: No hyperdense vessel or unexpected calcification. Skull: Normal. Negative for fracture or focal lesion. Sinuses/Orbits: No acute finding. Other: None. IMPRESSION: 1.  No acute intracranial abnormality. 2. Moderate cerebral atrophy and advanced chronic microvascular ischemic changes, unchanged. Electronically Signed   By: Larose Hires D.O.   On: 04/20/2022 14:59   DG Chest 2 View  Result Date: 04/20/2022 CLINICAL DATA:  Chest pain with shortness of breath and cough EXAM: CHEST - 2 VIEW COMPARISON:  06/23/2021 FINDINGS: Interstitial opacity diffusely with right upper lobe consolidation. Chronic right  pleural effusion with areas of loculation prior CT. Chronic cardiomegaly. There has been CABG and left atrial clipping with aortic valve replacement. Stable mediastinal contours. IMPRESSION: 1. Right upper lobe pneumonia. Followup PA and lateral chest X-ray is recommended in 3-4 weeks to ensure resolution. 2. Generalized interstitial opacity from chronic lung disease or pulmonary edema. 3. Chronic small right pleural effusion. Electronically Signed   By: Tiburcio Pea M.D.   On: 04/20/2022 05:49    Pending Labs Unresulted Labs (From admission, onward)     Start     Ordered   04/21/22 0500  Basic metabolic panel  Tomorrow morning,   R        04/20/22 1500   04/21/22 0500  CBC  Tomorrow morning,   R        04/20/22 1500   04/21/22 0500  Procalcitonin  Daily,   R      04/20/22 1506   04/20/22 1416  Respiratory (~20 pathogens) panel by PCR  (Respiratory panel by PCR (~20 pathogens, ~24 hr TAT)  w precautions)  Once,   R        04/20/22 1415   04/20/22 1416  SARS Coronavirus 2 by RT PCR (hospital order, performed in [facility] hospital lab) *cepheid single result test* Anterior Nasal Swab  (Tier 2 - Symptomatic/Asymptomatic)  Once,   R        04/20/22 1415   04/20/22 1335  Legionella Pneumophila Serogp 1 Ur Ag  Once,   URGENT        04/20/22 1334   04/20/22 1334  Lactic acid, plasma  STAT Now then every 3 hours,   R      04/20/22 1333   04/20/22 1334  Culture, blood (Routine X 2) w Reflex to ID Panel  BLOOD CULTURE X 2,   R      04/20/22 1333   04/20/22 1334  Strep pneumoniae urinary antigen  Once,   URGENT        04/20/22 1334            Vitals/Pain Today's Vitals   04/20/22 0815 04/20/22 0930 04/20/22 1150 04/20/22 1902  BP: 124/79 109/71 128/86 (!) 172/112  Pulse: 69 60 (!) 56 63  Resp: 15 17 16 16   Temp:      TempSrc:      SpO2: 97% 94% 97% 97%  PainSc:        Isolation Precautions Airborne and Contact precautions  Medications Medications  traMADol (ULTRAM) tablet 50  mg (has no administration in time range)  atorvastatin (LIPITOR) tablet 10 mg (10 mg Oral Given 04/20/22 1908)  furosemide (LASIX) tablet 20 mg (20 mg Oral Given 04/20/22 1909)  hydrALAZINE (APRESOLINE) tablet 25 mg (25 mg Oral Given 04/20/22 1915)  losartan (COZAAR) tablet 50 mg (has no administration in time range)  famotidine (PEPCID) tablet 10 mg (10 mg Oral Given 04/20/22 1907)  senna-docusate (Senokot-S) tablet 1 tablet (has no administration in time range)  tamsulosin (FLOMAX) capsule 0.4 mg (0.4 mg Oral Given 04/20/22 1907)  gabapentin (NEURONTIN) capsule 100 mg (100 mg Oral Given 04/20/22 1908)  diclofenac Sodium (VOLTAREN) 1 % topical gel 2 g (has no administration in time range)  cefTRIAXone (ROCEPHIN) 2 g in sodium chloride 0.9 % 100 mL IVPB (has no administration in time range)  azithromycin (ZITHROMAX) 500 mg in sodium chloride 0.9 % 250 mL IVPB (has no administration in time range)  enoxaparin (LOVENOX) injection 40 mg (has no administration in time range)  sodium chloride flush (NS) 0.9 % injection 3 mL (has no administration in time range)  sodium chloride flush (NS) 0.9 % injection 3 mL (has no administration in time range)  0.9 %  sodium chloride infusion (has no administration in time range)  acetaminophen (TYLENOL) tablet 650 mg (has no administration in time range)    Or  acetaminophen (TYLENOL) suppository 650 mg (has no administration in time range)  ondansetron (ZOFRAN) tablet 4 mg (has no administration in time range)    Or  ondansetron (ZOFRAN) injection 4 mg (has no administration in time range)  guaiFENesin-dextromethorphan (ROBITUSSIN DM) 100-10 MG/5ML syrup 5 mL (has no administration in time range)  cefTRIAXone (ROCEPHIN) 1 g in sodium chloride 0.9 % 100 mL IVPB (1 g Intravenous New Bag/Given 04/20/22 1911)  azithromycin (ZITHROMAX) 500 mg in sodium chloride 0.9 % 250 mL IVPB (0 mg Intravenous Stopped 04/20/22 1149)  cefTRIAXone (ROCEPHIN) 1 g in sodium chloride 0.9 % 100 mL  IVPB (0 g Intravenous Stopped 04/20/22 0958)  sodium chloride 0.9 % bolus 1,000 mL (0 mLs Intravenous Stopped 04/20/22 1358)    Mobility non-ambulatory Low fall risk   Focused Assessments Pulmonary Assessment Handoff:  Lung sounds:   O2 Device: Room Air      R Recommendations: See Admitting Provider Note  Report given to:   Additional Notes:

## 2022-04-20 NOTE — Assessment & Plan Note (Signed)
Well controlled continue his cozaar 50mg  daily, hydralazine 25mg  TID and lasix 20mg  daily

## 2022-04-20 NOTE — ED Triage Notes (Signed)
Per EMS, pt from [facility] has "shaking" since 2am on the 5th.  Now c/o of muscle tightness in arms and chest, and has swelling to the eyes all started tonight.  Pt has some SOB as well.  Pt able to speak in complete sentences, no respiratory distress noted.  50mg  benadryl IV given 92 heart rate, regular 94% on RA 18G L AC 156/102

## 2022-04-20 NOTE — ED Provider Notes (Signed)
[facility] EMERGENCY DEPARTMENT Provider Note   CSN: [id number] Arrival date & time: 04/20/22  0507     History  Chief Complaint  Patient presents with   Chest Pain   Shaking   Facial Swelling    Carlos Welch is a 76 y.o. male.  HPI Adult male presents via EMS for concern of shakiness, shortness of breath.  Patient arrives from healthcare facility.  Apparently his symptoms began yesterday, though his ability to provide timeline is questionable.  Patient denies focal pain, including chest, states that he feels sore all over, has some chest tightness, but this seems more consistent with shortness of breath.  No fever.  Patient notes some itchiness, has received Benadryl.    Home Medications Prior to Admission medications   Medication Sig Start Date End Date Taking? Authorizing Provider  acetaminophen (TYLENOL) 325 MG tablet Take 2 tablets (650 mg total) by mouth every 4 (four) hours as needed for mild pain (or temp > 37.5 C (99.5 F)). 06/27/21   Marguerita Merles Latif, DO  furosemide (LASIX) 20 MG tablet Take 1 tablet (20 mg total) by mouth daily. 07/15/21   Angiulli, Mcarthur Rossetti, PA-C  gabapentin (NEURONTIN) 100 MG capsule Take 1 capsule (100 mg total) by mouth 3 (three) times daily. 07/14/21   Angiulli, Mcarthur Rossetti, PA-C  hydrALAZINE (APRESOLINE) 25 MG tablet Take 1 tablet (25 mg total) by mouth every 8 (eight) hours. 07/14/21   Angiulli, Mcarthur Rossetti, PA-C  losartan (COZAAR) 50 MG tablet Take 1 tablet (50 mg total) by mouth daily. 07/15/21   Angiulli, Mcarthur Rossetti, PA-C  pantoprazole (PROTONIX) 40 MG tablet Take 1 tablet (40 mg total) by mouth daily. 06/28/21   Sheikh, Omair Latif, DO  senna-docusate (SENOKOT-S) 8.6-50 MG tablet Take 1 tablet by mouth at bedtime. 07/14/21   Angiulli, Mcarthur Rossetti, PA-C  tamsulosin (FLOMAX) 0.4 MG CAPS capsule Take 1 capsule (0.4 mg total) by mouth daily after supper. 07/14/21   Angiulli, Mcarthur Rossetti, PA-C  traMADol (ULTRAM) 50 MG tablet Take 1 tablet (50 mg  total) by mouth every 6 (six) hours as needed for severe pain or moderate pain. 07/14/21   Angiulli, Mcarthur Rossetti, PA-C      Allergies    Chocolate and Peanut-containing drug products    Review of Systems   Review of Systems  All other systems reviewed and are negative.   Physical Exam Updated Vital Signs BP 109/71   Pulse 60   Temp 98.3 F (36.8 C) (Oral)   Resp 17   SpO2 94%  Physical Exam Vitals and nursing note reviewed.  Constitutional:      General: He is not in acute distress.    Appearance: He is well-developed. He is ill-appearing. He is not toxic-appearing or diaphoretic.  HENT:     Head: Normocephalic and atraumatic.  Eyes:     Conjunctiva/sclera: Conjunctivae normal.  Cardiovascular:     Rate and Rhythm: Normal rate and regular rhythm.  Pulmonary:     Effort: Pulmonary effort is normal. No respiratory distress.     Breath sounds: No stridor.  Abdominal:     General: There is no distension.  Skin:    General: Skin is warm and dry.       Neurological:     Mental Status: He is alert and oriented to person, place, and time.     Comments: Diffuse atrophy, right side more pronounced.  No facial asymmetry, speech is clear  Psychiatric:  Mood and Affect: Mood is anxious.    ED Results / Procedures / Treatments   Labs (all labs ordered are listed, but only abnormal results are displayed) Labs Reviewed  CBC WITH DIFFERENTIAL/PLATELET - Abnormal; Notable for the following components:      Result Value   Platelets 116 (*)    Neutro Abs 7.8 (*)    Lymphs Abs 0.5 (*)    All other components within normal limits  BRAIN NATRIURETIC PEPTIDE - Abnormal; Notable for the following components:   B Natriuretic Peptide 125.4 (*)    All other components within normal limits  CK - Abnormal; Notable for the following components:   Total CK 39 (*)    All other components within normal limits  COMPREHENSIVE METABOLIC PANEL  ETHANOL  TROPONIN I (HIGH SENSITIVITY)   TROPONIN I (HIGH SENSITIVITY)    EKG EKG Interpretation  Date/Time:  Thursday April 20 2022 06:57:17 EDT Ventricular Rate:  86 PR Interval:    QRS Duration: 96 QT Interval:  400 QTC Calculation: 478 R Axis:   44 Text Interpretation: Atrial fibrillation with premature ventricular or aberrantly conducted complexes Nonspecific ST and T wave abnormality Abnormal ECG Confirmed by Gerhard Munch [phone]) on 04/20/2022 7:32:37 AM  Radiology DG Chest 2 View  Result Date: 04/20/2022 CLINICAL DATA:  Chest pain with shortness of breath and cough EXAM: CHEST - 2 VIEW COMPARISON:  06/23/2021 FINDINGS: Interstitial opacity diffusely with right upper lobe consolidation. Chronic right pleural effusion with areas of loculation prior CT. Chronic cardiomegaly. There has been CABG and left atrial clipping with aortic valve replacement. Stable mediastinal contours. IMPRESSION: 1. Right upper lobe pneumonia. Followup PA and lateral chest X-ray is recommended in 3-4 weeks to ensure resolution. 2. Generalized interstitial opacity from chronic lung disease or pulmonary edema. 3. Chronic small right pleural effusion. Electronically Signed   By: Tiburcio Pea M.D.   On: 04/20/2022 05:49    Procedures Procedures    Medications Ordered in ED Medications  sodium chloride 0.9 % bolus 1,000 mL (has no administration in time range)  azithromycin (ZITHROMAX) 500 mg in sodium chloride 0.9 % 250 mL IVPB (500 mg Intravenous New Bag/Given 04/20/22 0957)  cefTRIAXone (ROCEPHIN) 1 g in sodium chloride 0.9 % 100 mL IVPB (0 g Intravenous Stopped 04/20/22 7124)    ED Course/ Medical Decision Making/ A&P This patient with a Hx of stroke, CHF, A-fib presents to the ED for concern of generalized discomfort, chest tightness, this involves an extensive number of treatment options, and is a complaint that carries with it a high risk of complications and morbidity.    The differential diagnosis includes bacteremia, sepsis, pneumonia,  atypical ACS   Social Determinants of Health:  Nursing home resident  Additional history obtained:  Additional history and/or information obtained from chart review, notable for history including recovery from stroke, and ongoing efforts with palliative care   After the initial evaluation, orders, including: X-ray labs were initiated.   Patient placed on Cardiac and Pulse-Oximetry Monitors. The patient was maintained on a cardiac monitor.  The cardiac monitored showed an rhythm of 60 sinus normal The patient was also maintained on pulse oximetry. The readings were typically 90% room air normal   On repeat evaluation of the patient improved 12:03 PM Patient in no distress, awake, alert, has received antibiotics, has no oxygen requirement Lab Tests:  I personally interpreted labs.  The pertinent results include: BNP 125, troponin 7, no leukocytosis  Imaging Studies ordered:  I independently  visualized and interpreted imaging which showed right upper lobe pneumonia I agree with the radiologist interpretation  Dispostion / Final MDM:  After consideration of the diagnostic results and the patient's response to treatment, adult male presenting from a nursing facility, with multiple medical issues including prior stroke, alcohol use, A-fib, deconditioning, ongoing palliative issues presents with fatigue, tightness, is found to have right sided pneumonia.  Patient has no hypotension, no new oxygen requirement, received initial antibiotics here.  No early evidence for concurrent CHF exacerbation, though the troponin is 0, values are within his prior range.  Low suspicion for atypical ACS., he is a residence in a facility, thus meets criteria for of care acquired pneumonia, and with curb 65 score of 2, was admitted to our facility for further monitoring, management. Final Clinical Impression(s) / ED Diagnoses Final diagnoses:  HCAP (healthcare-associated pneumonia)     Gerhard Munch,  MD 04/20/22 1536

## 2022-04-20 NOTE — Assessment & Plan Note (Signed)
Stable, continue to monitor  ?

## 2022-04-20 NOTE — ED Provider Triage Note (Signed)
Emergency Medicine Provider Triage Evaluation Note  Carlos Welch , a 76 y.o. male  was evaluated in triage.  Pt arrives via EMS for evaluation of shaking, chest tightness, shortness of breath and eye swelling.  Patient arrives from [facility].  Patient reportedly started having some shaking starting at 2 AM yesterday, but over night shaking acutely worsened and patient started complaining of some muscle stiffness and chest pain and shortness of breath.  Patient also noted to have some swelling and redness around both eyes.  No other facial swelling noted.  Patient given 50 mg of Benadryl with EMS and does report some improvement in all of his symptoms.  Patient has peanut allergy, he is unsure of any potential exposures.  Review of Systems  Positive: Shaking, chest pain, shortness of breath, eye swelling Negative: Throat swelling, nausea, vomiting  Physical Exam  BP (!) 152/89 (BP Location: Right Arm)   Pulse 89   Temp 98.3 F (36.8 C) (Oral)   Resp 18   SpO2 94%  Gen:   Awake, no distress   Resp:  Normal effort, faint rhonchi noted in the right lower lung fields MSK:   Moves extremities without difficulty  Other:  No facial swelling, posterior oropharynx clear, patient protecting airway, no rash or skin changes noted  Medical Decision Making  Medically screening exam initiated at 5:18 AM.  Appropriate orders placed.  Carlos Welch was informed that the remainder of the evaluation will be completed by another provider, this initial triage assessment does not replace that evaluation, and the importance of remaining in the ED until their evaluation is complete.  Patient with odd constellation of symptoms, received Benadryl with improvement.  Does not clinically appear to be having anaphylactic reaction.  Will initiate labs, EKG and chest x-ray for evaluation of chest pain and continue to monitor patient.   Dartha Lodge, [place] 04/20/22 [phone]

## 2022-04-20 NOTE — Assessment & Plan Note (Signed)
Stable, troponin wnl, no changes on ekg Continue medical management with statin. Discuss eliquis re-initiation s/p ICH in 9/22.

## 2022-04-20 NOTE — H&P (Signed)
History and Physical    Patient: Carlos Welch XNA:[id number] DOB: [birth year] DOA: 04/20/2022 DOS: the patient was seen and examined on 04/20/2022 PCP: [name], PA-C  Patient coming from: SNF -uses walker    Chief Complaint: generalized pain, shortness of breath   HPI: Carlos Welch is a 76 y.o. 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 He states last night he had chills and shaking and then had pain all over from chest down to his legs that was intermittent, sharp and rated as a 10/10. He denies any precipitating factors, fever, lifting, new medication. He states he may have had some confusion yesterday as well, but doesn't feel confused anymore. This morning he came to ED. The pain seems to have resolved as well. He denies any upper respiratory symptoms, but admits to a dry cough. No shortness of breath, palpitations, orthopnea, leg swelling or weight gain.   After speaking with his daughter she states he had complained of shortness of breath last night and they put him on oxygen because it kept dropping.  He has also had dysphagia s/p stroke and seems to have been coughing post swallowing. Was placed on a soft diet.   He does not smoke or drink.   Denies any fever, vision changes  +headaches, no chest pain or palpitations, shortness of breath, abdominal pain, N/V/D, dysuria or leg swelling.    ER Course:  vitals: afebrile, bp: 152/89, HR: 89, RR: 18, oxygen: 94% RA Pertinent labs: platelets 116, troponin wnl x2, bnp: 125,  CXR: RUL pneumonia. Generalized interstitial opacity from chronic lung disease or pulmonary edema, chronic small right pleural effusion.  In ED given  rocephin, azithromycin and 1L bolus of IVF. TRH asked to admit.      Review of Systems: As mentioned in the history of present illness. All other systems reviewed and are negative. Past Medical History:  Diagnosis Date   A-fib (HCC)    Abscess of jaw 07/19/2020   Acute on chronic heart failure with preserved ejection fraction (HCC) 07/08/2020   Aortic stenosis    a. s/p pericardial AVR 2015.   Aortic valve stenosis 07/19/2020   Bacteremia 07/19/2020   Bacterial endocarditis    BPH (benign prostatic hyperplasia) 07/08/2020   CAD (coronary artery disease)    a. s/p CABG, AVR, LAA clipping [facility] at [facility].   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/08/2020   Elevated troponin 07/08/2020   Essential hypertension 07/08/2020   Generalized abdominal pain    History of noncompliance with medical treatment    Hyperlipemia 07/08/2020   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   Malnutrition (HCC)    MRSA bacteremia    Nonrheumatic mitral valve regurgitation 07/19/2020   Normocytic anemia 07/08/2020   Pressure injury of skin 05/12/2020   S/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 2015   Severe malnutrition (  [place]) 07/19/2020   Thrombocytopenia ([place]) 07/08/2020   TIA (transient ischemic attack)    Weight loss 07/19/2020   Past Surgical History:  Procedure Laterality Date   AORTIC VALVE REPLACEMENT     CORONARY ARTERY BYPASS GRAFT  2015   HERNIA REPAIR     REMOVAL OF IMPLANT  03/14/2020   TEE WITHOUT CARDIOVERSION N/A 05/13/2020   Procedure: TRANSESOPHAGEAL ECHOCARDIOGRAM (TEE);  Surgeon: Lelon Perla, MD;   Location: [facility] ENDOSCOPY;  Service: Cardiovascular;  Laterality: N/A;   Social History:  reports that he quit smoking about 11 years ago. His smoking use included cigarettes and cigars. He started smoking about 59 years ago. He has a 141.00 pack-year smoking history. He has never used smokeless tobacco. He reports that he does not drink alcohol and does not use drugs.  Allergies  Allergen Reactions   Chocolate Rash    9.9.2022 Daughter reports that the patient regularly eats chocolate without any issues.   Peanut-Containing Drug Products Rash    Family History  Problem Relation Age of Onset   CAD Neg Hx     Prior to Admission medications   Medication Sig Start Date End Date Taking? Authorizing Provider  acetaminophen (TYLENOL) 325 MG tablet Take 2 tablets (650 mg total) by mouth every 4 (four) hours as needed for mild pain (or temp > 37.5 C (99.5 F)). 06/27/21   Raiford Noble Latif, DO  furosemide (LASIX) 20 MG tablet Take 1 tablet (20 mg total) by mouth daily. 07/15/21   Angiulli, Lavon Paganini, PA-C  gabapentin (NEURONTIN) 100 MG capsule Take 1 capsule (100 mg total) by mouth 3 (three) times daily. 07/14/21   Angiulli, Lavon Paganini, PA-C  hydrALAZINE (APRESOLINE) 25 MG tablet Take 1 tablet (25 mg total) by mouth every 8 (eight) hours. 07/14/21   Angiulli, Lavon Paganini, PA-C  losartan (COZAAR) 50 MG tablet Take 1 tablet (50 mg total) by mouth daily. 07/15/21   Angiulli, Lavon Paganini, PA-C  pantoprazole (PROTONIX) 40 MG tablet Take 1 tablet (40 mg total) by mouth daily. 06/28/21   Sheikh, Omair Latif, DO  senna-docusate (SENOKOT-S) 8.6-50 MG tablet Take 1 tablet by mouth at bedtime. 07/14/21   Angiulli, Lavon Paganini, PA-C  tamsulosin (FLOMAX) 0.4 MG CAPS capsule Take 1 capsule (0.4 mg total) by mouth daily after supper. 07/14/21   Angiulli, Lavon Paganini, PA-C  traMADol (ULTRAM) 50 MG tablet Take 1 tablet (50 mg total) by mouth every 6 (six) hours as needed for severe pain or moderate pain. 07/14/21   Cathlyn Parsons, PA-C     Physical Exam: Vitals:   04/20/22 0510 04/20/22 0815 04/20/22 0930 04/20/22 1150  BP: (!) 152/89 124/79 109/71 128/86  Pulse: 89 69 60 (!) 56  Resp: 18 15 17 16   Temp: 98.3 F (36.8 C)     TempSrc: Oral     SpO2: 94% 97% 94% 97%   General:  Appears calm and comfortable and is in NAD Eyes:  PERRL, EOMI, normal lids, iris. Some discharge from right eye  ENT:  grossly normal hearing, lips & tongue, mmm; appropriate dentition with plaque buildup  Neck:  no LAD, masses or thyromegaly; no carotid bruits, no JVD Cardiovascular:  RRR, no m/r/g. No LE edema.  Respiratory:   CTA bilaterally with no wheezes/rales/rhonchi.  Normal respiratory effort. Abdomen:  soft, NT, ND, NABS Back:   normal alignment, no CVAT Skin:  no rash or induration seen on limited exam Musculoskeletal:  grossly normal tone BUE/BLE, good ROM, no bony  abnormality Lower extremity:  No LE edema.  Limited foot exam with no ulcerations.  2+ distal pulses. Psychiatric:  grossly normal mood and affect, speech fluent and appropriate, AOx3 Neurologic:  CN 2-12 grossly intact, moves all extremities in coordinated fashion, sensation intact   Radiological Exams on Admission: Independently reviewed - see discussion in A/P where applicable  CT Head Wo Contrast  Result Date: 04/20/2022 CLINICAL DATA:  Fall, mental status change. EXAM: CT HEAD WITHOUT CONTRAST TECHNIQUE: Contiguous axial images were obtained from the base of the skull through the vertex without intravenous contrast. RADIATION DOSE REDUCTION: This exam was performed according to the departmental dose-optimization program which includes automated exposure control, adjustment of the mA and/or kV according to patient size and/or use of iterative reconstruction technique. COMPARISON:  CT examination dated June 30, 2021 FINDINGS: Brain: No evidence of acute infarction, hemorrhage, hydrocephalus, extra-axial collection or mass lesion/mass effect. Diffuse  low-attenuation of the periventricular and subcortical white matter presumed chronic microvascular ischemic changes. Moderate cerebral volume loss. Vascular: No hyperdense vessel or unexpected calcification. Skull: Normal. Negative for fracture or focal lesion. Sinuses/Orbits: No acute finding. Other: None. IMPRESSION: 1.  No acute intracranial abnormality. 2. Moderate cerebral atrophy and advanced chronic microvascular ischemic changes, unchanged. Electronically Signed   By: Larose Hires D.O.   On: 04/20/2022 14:59   DG Chest 2 View  Result Date: 04/20/2022 CLINICAL DATA:  Chest pain with shortness of breath and cough EXAM: CHEST - 2 VIEW COMPARISON:  06/23/2021 FINDINGS: Interstitial opacity diffusely with right upper lobe consolidation. Chronic right pleural effusion with areas of loculation prior CT. Chronic cardiomegaly. There has been CABG and left atrial clipping with aortic valve replacement. Stable mediastinal contours. IMPRESSION: 1. Right upper lobe pneumonia. Followup PA and lateral chest X-ray is recommended in 3-4 weeks to ensure resolution. 2. Generalized interstitial opacity from chronic lung disease or pulmonary edema. 3. Chronic small right pleural effusion. Electronically Signed   By: Tiburcio Pea M.D.   On: 04/20/2022 05:49    EKG: Independently reviewed.  ? afib with rate 86; nonspecific ST changes with no evidence of acute ischemia   Labs on Admission: I have personally reviewed the available labs and imaging studies at the time of the admission.  Pertinent labs:   platelets 116,  troponin wnl x2,  bnp: 125,   Assessment and Plan: Principal Problem:   HCAP (healthcare-associated pneumonia) Active Problems:   Chronic atrial fibrillation (HCC)   Chronic combined systolic and diastolic congestive heart failure (HCC)   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 deficits    Essential hypertension   Coronary artery disease involving coronary bypass graft of native heart without  angina pectoris   Hyperlipemia   Thrombocytopenia (HCC)    Assessment and Plan: * HCAP (healthcare-associated pneumonia) 76 year old presenting with complaints of generalized pain, shortness of breath and cough found to have RUL pneumonia.  -CURB 65 score of 2, place in obs on telemetry -continue rocephin/azithromycin with low threshold to broaden if needed -This appears to be most likely HCAP since resides in SNF.  -Other etiologies include aspiration with hx of dysphagia, cough post swallow and resolved confusion vs. Viral.  -aspiration precautions and SLP eval as well as RT eval  -blood cultures pending -lactic acid -check RVP and covid -PCT  -robitussin for cough -IS to bedside  Chronic atrial fibrillation (HCC) CHA2D2s-vasc score of 6 eliquis held after ICH in 06/2021; however, her has never followed up with cardiology to see about  starting this back up I discussed with daughter and she confirms they have not been to cardiology Would recommend further discussion and discuss with cards due to high risk of stroke Continue VTE prophylaxis for now  On no rate controlling agent   Chronic combined systolic and diastolic congestive heart failure (HCC) No signs of overload on exam. History of repetitive fluid overload due to noncompliance with medication;however, since being in SNF he has maintained his euvolemic state -strict I/O -last echo: normal EF, indeterminate diastolic function, RVF moderately reduced, severe MR. 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   Essential hypertension Well controlled continue his cozaar 50mg  daily, hydralazine 25mg  TID and lasix 20mg  daily   Coronary artery disease involving  coronary bypass graft of native heart without angina pectoris Stable, troponin wnl, no changes on ekg Continue medical management with statin. Discuss eliquis re-initiation s/p ICH in 9/22.   Hyperlipemia Continue lipitor 10mg    Thrombocytopenia (HCC) Stable, continue to monitor    Advance Care Planning:   Code Status: Full Code   Consults: RT/SLP   DVT Prophylaxis: Lovenox   Family Communication: updated daughter by phone: heather hearn: [phone]  Severity of Illness: The appropriate patient status for this patient is OBSERVATION. Observation status is judged to be reasonable and necessary in order to provide the required intensity of service to ensure the patient's safety. The patient's presenting symptoms, physical exam findings, and initial radiographic and laboratory data in the context of their medical condition is felt to place them at decreased risk for further clinical deterioration. Furthermore, it is anticipated that the patient will be medically stable for discharge from the hospital within 2 midnights of admission.   Author: , MD 04/20/2022 3:15 PM  For on call review www. .

## 2022-04-21 ENCOUNTER — Encounter (HOSPITAL_COMMUNITY): Payer: Self-pay | Admitting: Family Medicine

## 2022-04-21 DIAGNOSIS — I482 Chronic atrial fibrillation, unspecified: Secondary | ICD-10-CM

## 2022-04-21 DIAGNOSIS — J189 Pneumonia, unspecified organism: Secondary | ICD-10-CM | POA: Diagnosis not present

## 2022-04-21 DIAGNOSIS — I25810 Atherosclerosis of coronary artery bypass graft(s) without angina pectoris: Secondary | ICD-10-CM | POA: Diagnosis not present

## 2022-04-21 DIAGNOSIS — I10 Essential (primary) hypertension: Secondary | ICD-10-CM

## 2022-04-21 DIAGNOSIS — I5042 Chronic combined systolic (congestive) and diastolic (congestive) heart failure: Secondary | ICD-10-CM

## 2022-04-21 LAB — BASIC METABOLIC PANEL
Anion gap: 9 (ref 5–15)
BUN: 17 mg/dL (ref 8–23)
CO2: 24 mmol/L (ref 22–32)
Calcium: 8.6 mg/dL — ABNORMAL LOW (ref 8.9–10.3)
Chloride: 105 mmol/L (ref 98–111)
Creatinine, Ser: 1.01 mg/dL (ref 0.61–1.24)
GFR, Estimated: >60 mL/min (ref >60)
Glucose, Bld: 93 mg/dL (ref 70–99)
Potassium: 3.6 mmol/L (ref 3.5–5.1)
Sodium: 138 mmol/L (ref 135–145)

## 2022-04-21 LAB — RESPIRATORY PANEL BY PCR

## 2022-04-21 LAB — CBC
HCT: 42.1 % (ref 39.0–52.0)
Hemoglobin: 14 g/dL (ref 13.0–17.0)
MCH: 29.7 pg (ref 26.0–34.0)
MCHC: 33.3 g/dL (ref 30.0–36.0)
MCV: 89.4 fL (ref 80.0–100.0)
Platelets: 101 10*3/uL — ABNORMAL LOW (ref 150–400)
RBC: 4.71 MIL/uL (ref 4.22–5.81)
RDW: 14.2 % (ref 11.5–15.5)
WBC: 8 10*3/uL (ref 4.0–10.5)
nRBC: 0 % (ref 0.0–0.2)

## 2022-04-21 LAB — PROCALCITONIN: Procalcitonin: 1.2 ng/mL

## 2022-04-21 LAB — SARS CORONAVIRUS 2 BY RT PCR: SARS Coronavirus 2 by RT PCR: NEGATIVE

## 2022-04-21 LAB — STREP PNEUMONIAE URINARY ANTIGEN: Strep Pneumo Urinary Antigen: NEGATIVE

## 2022-04-21 MED ORDER — ORAL CARE MOUTH RINSE: 15.0000 mL | OROMUCOSAL | Status: DC | PRN

## 2022-04-21 NOTE — Progress Notes (Signed)
[facility]  PROGRESS NOTE  Carlos Welch YOV:[id number] DOB: 06-Aug-1946 DOA: 04/20/2022 PCP: [name], PA-C   Brief HPI:   76 year old male with history of hypertension, TIA, chronic atrial fibrillation not on anticoagulation, hyperlipidemia, CAD s/p CABG, aortic valve stenosis, diastolic heart failure, history of thalamic ICH in September 2022 with right-sided weakness came to ED with complaints of generalized pain and confusion which resolved.  Patient was found to be dyspneic requiring oxygen.  Patient was admitted with healthcare associated pneumonia, started on antibiotics.    Subjective   Patient seen and examined, denies shortness of breath.   Assessment/Plan:    Community-acquired pneumonia -Presented with shortness of breath, generalized pain and cough -Chest x-ray showed right upper lobe pneumonia. -Patient started on ceftriaxone and Zithromax -Follow blood culture results -Procalcitonin 1.20, lactic acid 1.4  Chronic atrial fibrillation -CHA2D2s-vasc score of 6 -eliquis held after ICH in 06/2021; however, her has never followed up with cardiology to see about starting this back up Would recommend further discussion and discuss with cards due to high risk of stroke Continue VTE prophylaxis for now  On no rate controlling agent   Chronic combined systolic and diastolic CHF -No signs of volume overload -Last echo showed normal EF, indeterminate diastolic function, severe MR  Intracranial hemorrhage in September 2022 with residual right-sided deficits -Continue blood pressure control -Neurology recommended in November 2022 to follow-up with cardiology -Patient has not seen cardiologist since then  CAD s/p CABG without angina pectoris -Stable -Continue medical management  Hyperlipidemia -Continue Lipitor  Thrombocytopenia -Chronic -We will monitor in the hospital    Medications     atorvastatin  10 mg Oral Daily   diclofenac Sodium  2 g  Topical TID   enoxaparin (LOVENOX) injection  40 mg Subcutaneous Q24H   famotidine  10 mg Oral Daily   furosemide  20 mg Oral Daily   gabapentin  100 mg Oral TID   hydrALAZINE  25 mg Oral Q8H   losartan  50 mg Oral Daily   senna-docusate  1 tablet Oral QHS   sodium chloride flush  3 mL Intravenous Q12H   tamsulosin  0.4 mg Oral QPC supper     Data Reviewed:   CBG:  Recent Labs  Lab 04/20/22 2140  GLUCAP 114*    SpO2: 96 %    Vitals:   04/20/22 2335 04/21/22 0252 04/21/22 0530 04/21/22 1100  BP: (!) 147/85 126/75 108/72 134/86  Pulse:    (!) 58  Resp:    18  Temp: 98.7 F (37.1 C) 98.4 F (36.9 C) 98.2 F (36.8 C) 98.1 F (36.7 C)  TempSrc: Oral Oral Oral Oral  SpO2:    96%      Data Reviewed:  Basic Metabolic Panel: Recent Labs  Lab 04/20/22 0518 04/21/22 0208  NA 140 138  K 3.6 3.6  CL 105 105  CO2 23 24  GLUCOSE 91 93  BUN 19 17  CREATININE 1.21 1.01  CALCIUM 8.9 8.6*    CBC: Recent Labs  Lab 04/20/22 0518 04/21/22 0208  WBC 9.1 8.0  NEUTROABS 7.8*  --   HGB 15.5 14.0  HCT 48.0 42.1  MCV 92.7 89.4  PLT 116* 101*    LFT Recent Labs  Lab 04/20/22 0518  AST 20  ALT 9  ALKPHOS 74  BILITOT 1.0  PROT 6.7  ALBUMIN 3.7     Antibiotics: Anti-infectives (From admission, onward)    Start     [employer]  Frequency Ordered Stop   04/21/22 1200  azithromycin (ZITHROMAX) 500 mg in sodium chloride 0.9 % 250 mL IVPB        500 mg 250 mL/hr over 60 Minutes Intravenous Every 24 hours 04/20/22 1500 04/26/22 1159   04/21/22 1000  cefTRIAXone (ROCEPHIN) 2 g in sodium chloride 0.9 % 100 mL IVPB        2 g 200 mL/hr over 30 Minutes Intravenous Every 24 hours 04/20/22 1500 04/26/22 0959   04/20/22 1515  cefTRIAXone (ROCEPHIN) 1 g in sodium chloride 0.9 % 100 mL IVPB        1 g 200 mL/hr over 30 Minutes Intravenous  Once 04/20/22 1508 04/20/22 2130   04/20/22 0830  azithromycin (ZITHROMAX) 500 mg in sodium chloride 0.9 % 250 mL IVPB         500 mg 250 mL/hr over 60 Minutes Intravenous  Once 04/20/22 0828 04/20/22 1149   04/20/22 0830  cefTRIAXone (ROCEPHIN) 1 g in sodium chloride 0.9 % 100 mL IVPB        1 g 200 mL/hr over 30 Minutes Intravenous  Once 04/20/22 0828 04/20/22 0958        DVT prophylaxis: Lovenox  Code Status: Full code  Family Communication: No family at bedside   CONSULTS    Objective    Physical Examination:   General-appears in no acute distress Heart-S1-S2, regular, no murmur auscultated Lungs-clear to auscultation bilaterally, no wheezing or crackles auscultated Abdomen-soft, nontender, no organomegaly Extremities-no edema in the lower extremities Neuro-alert, oriented x3, no focal deficit noted  Status is: Inpatient:             Meredeth Ide   [facility] If 7PM-7AM, please contact night-coverage at [email or web address], Office  [phone]   04/21/2022, 6:10 PM  LOS: 0 days

## 2022-04-21 NOTE — Evaluation (Signed)
Clinical/Bedside Swallow Evaluation Patient Details  Name: Carlos Welch MRN: [id number] Date of Birth: 10/07/1946  Today's Date: 04/21/2022 Time: SLP Start Time (ACUTE ONLY): 1528 SLP Stop Time (ACUTE ONLY): 1543 SLP Time Calculation (min) (ACUTE ONLY): 15 min  Past Medical History:  Past Medical History:  Diagnosis Date   A-fib (HCC)    Abscess of jaw 07/19/2020   Acute on chronic heart failure with preserved ejection fraction (HCC) 07/08/2020   Aortic stenosis    a. s/p pericardial AVR 2015.   Aortic valve stenosis 07/19/2020   Bacteremia 07/19/2020   Bacterial endocarditis    BPH (benign prostatic hyperplasia) 07/08/2020   CAD (coronary artery disease)    a. s/p CABG, AVR, LAA clipping 2015 at [facility].   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/08/2020   Elevated troponin 07/08/2020   Essential hypertension 07/08/2020   Generalized abdominal pain    History of noncompliance with medical treatment    Hyperlipemia 07/08/2020   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   Malnutrition (HCC)    MRSA bacteremia    Nonrheumatic mitral valve regurgitation 07/19/2020   Normocytic anemia 07/08/2020   Pressure injury of skin 05/12/2020   S/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 Followup PA and lateral chest X-ray  is recommended in 3-4 weeks to ensure resolution.  2. Generalized interstitial opacity from chronic lung disease or  pulmonary edema; Daughter concerned with R pocketing and some coughing when eating; MBS completed 07/13/21 revealing need for Dysphagia 2/thin liquids.  BSE generated.    Assessment / Plan / Recommendation  Clinical Impression  Pt seen for clinical swallowing evaluation with impaired mastication noted primarily due to ill-fitting dentures.  Dentures were loose in oral cavity during mastication efforts impacting oral preparation/propulsion with soft solid consistency.  Other consistencies appeared adequate, but this assessment included a small trial of liquids/solids.  Due to prior ST during previous hospitalization with MBS indicating need for Dysphagia 2/thin liquids in September of 2022, ST will f/u 1x for diet tolerance in acute setting.  Recommend continuing current diet of Dysphagia 3/thin, but if mastication efforts continue to deteriorate with dentures, may consider downgrading diet to Dysphagia 2/thin liquids  for ease of transition.  Thank you for this consult. SLP Visit Diagnosis: Dysphagia, unspecified (R13.10)    Aspiration Risk  Mild aspiration risk    Diet Recommendation   Dysphagia 3/thin liquids   Medication  Administration: Other (Comment) (crushed/puree prn if pt is unable to take medications whole with liquids)    Other  Recommendations Oral Care Recommendations: Oral care BID    Recommendations for follow up therapy are one component of a multi-disciplinary discharge planning process, led by the attending physician.  Recommendations may be updated based on patient status, additional functional criteria and insurance authorization.  Follow up Recommendations Follow physician's recommendations for discharge plan and follow up therapies      Assistance Recommended at Discharge Intermittent Supervision/Assistance  Functional Status Assessment Patient has had a recent decline in their functional status and demonstrates the ability to make significant improvements in function in a reasonable and predictable amount of time.  Frequency and Duration min 1 x/week  1 week       Prognosis Prognosis for Safe Diet Advancement: Good      Swallow Study   General Date of Onset: 04/20/22 HPI: 76 y.o. 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 Followup PA and lateral chest X-ray  is recommended in 3-4 weeks to ensure resolution.  2. Generalized interstitial opacity from chronic lung disease or  pulmonary edema; Daughter concerned with R pocketing and some coughing when eating; MBS completed 07/13/21 revealing need for Dysphagia 2/thin liquids.  BSE generated. Type of Study: Bedside Swallow Evaluation Previous Swallow Assessment: MBS on 07/13/21 revealing need for Dysphagia 2/thin liquids Diet Prior to this Study: Dysphagia 3 (soft);Thin  liquids Temperature Spikes Noted: No Respiratory Status: Room air History of Recent Intubation: No Behavior/Cognition: Alert;Cooperative Oral Cavity Assessment: Within Functional Limits Oral Care Completed by SLP: No Oral Cavity - Dentition: Dentures, top;Dentures, bottom;Other (Comment) (ill-fitting) Vision: Functional for self-feeding Self-Feeding Abilities: Able to feed self Patient Positioning: Upright in bed Baseline Vocal Quality: Hoarse (min) Volitional Cough: Strong Volitional Swallow: Able to elicit    Oral/Motor/Sensory Function Overall Oral Motor/Sensory Function: Within functional limits   Ice Chips Ice chips: Not tested   Thin Liquid Thin Liquid: Within functional limits Presentation: Straw    Nectar Thick Nectar Thick Liquid: Not tested   Honey Thick Honey Thick Liquid: Not tested   Puree Puree: Within functional limits Presentation: Self Fed   Solid     Solid: Impaired Presentation: Self Fed Oral Phase Impairments: Impaired mastication;Reduced lingual movement/coordination Oral Phase Functional Implications: Impaired mastication;Prolonged oral transit Pharyngeal Phase Impairments: Suspected delayed Swallow;Other (comments) (due to mastication efforts being increased)      Tressie Stalker, M.S., CCC-SLP 04/21/2022,5:55 PM

## 2022-04-22 DIAGNOSIS — Z87891 Personal history of nicotine dependence: Secondary | ICD-10-CM | POA: Diagnosis not present

## 2022-04-22 DIAGNOSIS — J189 Pneumonia, unspecified organism: Secondary | ICD-10-CM | POA: Diagnosis present

## 2022-04-22 DIAGNOSIS — Z953 Presence of xenogenic heart valve: Secondary | ICD-10-CM | POA: Diagnosis not present

## 2022-04-22 DIAGNOSIS — I110 Hypertensive heart disease with heart failure: Secondary | ICD-10-CM | POA: Diagnosis present

## 2022-04-22 DIAGNOSIS — Z20822 Contact with and (suspected) exposure to covid-19: Secondary | ICD-10-CM | POA: Diagnosis present

## 2022-04-22 DIAGNOSIS — I25810 Atherosclerosis of coronary artery bypass graft(s) without angina pectoris: Secondary | ICD-10-CM | POA: Diagnosis present

## 2022-04-22 DIAGNOSIS — Z91018 Allergy to other foods: Secondary | ICD-10-CM | POA: Diagnosis not present

## 2022-04-22 DIAGNOSIS — Y95 Nosocomial condition: Secondary | ICD-10-CM | POA: Diagnosis present

## 2022-04-22 DIAGNOSIS — E785 Hyperlipidemia, unspecified: Secondary | ICD-10-CM | POA: Diagnosis present

## 2022-04-22 DIAGNOSIS — D696 Thrombocytopenia, unspecified: Secondary | ICD-10-CM | POA: Diagnosis present

## 2022-04-22 DIAGNOSIS — I2510 Atherosclerotic heart disease of native coronary artery without angina pectoris: Secondary | ICD-10-CM | POA: Diagnosis present

## 2022-04-22 DIAGNOSIS — I482 Chronic atrial fibrillation, unspecified: Secondary | ICD-10-CM | POA: Diagnosis present

## 2022-04-22 DIAGNOSIS — Z9102 Food additives allergy status: Secondary | ICD-10-CM | POA: Diagnosis not present

## 2022-04-22 DIAGNOSIS — I5042 Chronic combined systolic (congestive) and diastolic (congestive) heart failure: Secondary | ICD-10-CM | POA: Diagnosis present

## 2022-04-22 DIAGNOSIS — N400 Enlarged prostate without lower urinary tract symptoms: Secondary | ICD-10-CM | POA: Diagnosis present

## 2022-04-22 DIAGNOSIS — Z951 Presence of aortocoronary bypass graft: Secondary | ICD-10-CM | POA: Diagnosis not present

## 2022-04-22 DIAGNOSIS — R1310 Dysphagia, unspecified: Secondary | ICD-10-CM | POA: Diagnosis present

## 2022-04-22 DIAGNOSIS — I69351 Hemiplegia and hemiparesis following cerebral infarction affecting right dominant side: Secondary | ICD-10-CM | POA: Diagnosis not present

## 2022-04-22 DIAGNOSIS — Z79899 Other long term (current) drug therapy: Secondary | ICD-10-CM | POA: Diagnosis not present

## 2022-04-22 LAB — CBC
HCT: 43.8 % (ref 39.0–52.0)
Hemoglobin: 14.4 g/dL (ref 13.0–17.0)
MCH: 29.5 pg (ref 26.0–34.0)
MCHC: 32.9 g/dL (ref 30.0–36.0)
MCV: 89.8 fL (ref 80.0–100.0)
Platelets: 105 10*3/uL — ABNORMAL LOW (ref 150–400)
RBC: 4.88 MIL/uL (ref 4.22–5.81)
RDW: 14 % (ref 11.5–15.5)
WBC: 5.9 10*3/uL (ref 4.0–10.5)
nRBC: 0 % (ref 0.0–0.2)

## 2022-04-22 LAB — BASIC METABOLIC PANEL
Anion gap: 9 (ref 5–15)
BUN: 15 mg/dL (ref 8–23)
CO2: 24 mmol/L (ref 22–32)
Calcium: 8.8 mg/dL — ABNORMAL LOW (ref 8.9–10.3)
Chloride: 104 mmol/L (ref 98–111)
Creatinine, Ser: 1.09 mg/dL (ref 0.61–1.24)
GFR, Estimated: >60 mL/min (ref >60)
Glucose, Bld: 83 mg/dL (ref 70–99)
Potassium: 3.8 mmol/L (ref 3.5–5.1)
Sodium: 137 mmol/L (ref 135–145)

## 2022-04-22 LAB — PROCALCITONIN: Procalcitonin: 0.81 ng/mL

## 2022-04-22 MED ORDER — ARTIFICIAL TEARS OPHTHALMIC OINT: TOPICAL_OINTMENT | OPHTHALMIC | Status: DC | PRN

## 2022-04-22 NOTE — Progress Notes (Addendum)
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  Patient was found to be dyspneic requiring oxygen.  Patient was admitted with healthcare associated pneumonia, started on antibiotics.   Subjective   Patient seen and examined, breathing has significantly improved.  Not requiring oxygen.   Assessment/Plan:    Community-acquired pneumonia -Presented with shortness of breath, generalized pain and cough -Chest x-ray showed right upper lobe pneumonia. -Patient started on ceftriaxone and Zithromax -Follow blood culture results -Procalcitonin 1.20 > 0.81, lactic acid 1.4  Chronic atrial fibrillation -CHA2D2s-vasc score of 6 -eliquis held after ICH in 06/2021; however, her has never followed up with cardiology to see about starting this back up Would recommend further discussion with cards due to high risk of stroke Continue VTE prophylaxis for now  On no rate controlling agent  -Discussed with cardiologist on-call.  Recommend to follow-up with primary cardiologist Dr. 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 -Continue blood pressure control -Neurology recommended in November 2022 to follow-up with cardiology -Patient has not seen cardiologist since then  CAD s/p CABG  without angina pectoris -Stable -Continue medical management  Hyperlipidemia -Continue Lipitor  Thrombocytopenia -Chronic -We will monitor in the hospital    Medications     atorvastatin  10 mg Oral Daily   diclofenac Sodium  2 g Topical TID   enoxaparin (LOVENOX) injection  40 mg Subcutaneous Q24H   famotidine  10 mg Oral Daily   furosemide  20 mg Oral Daily   gabapentin  100 mg Oral TID   hydrALAZINE  25 mg Oral Q8H   losartan  50 mg Oral Daily   senna-docusate  1 tablet Oral QHS   sodium chloride flush  3 mL Intravenous Q12H   tamsulosin  0.4 mg Oral QPC supper     Data Reviewed:   CBG:  Recent Labs  Lab 04/20/22 2140  GLUCAP 114*    SpO2: 97 %    Vitals:   04/21/22 2154 04/21/22 2300 04/22/22 0407 04/22/22 0758  BP: (!) 148/101 (!) 142/88 (!) 138/96 (!) 154/109  Pulse: 63  60 67  Resp: 18   16  Temp: 98.8 F (37.1 C) 98.8 F (37.1 C) 98.5 F (36.9 C) 98.6 F (37 C)  TempSrc: Oral Oral Oral Oral  SpO2:    97%  Height: 6\' 2"  (1.88 m)         Data Reviewed:  Basic Metabolic Panel: Recent Labs  Lab 04/20/22 0518 04/21/22 0208 04/22/22 0152  NA 140 138 137  K 3.6 3.6 3.8  CL 105 105 104  CO2 23 24 24   GLUCOSE 91 93 83  BUN 19 17 15   CREATININE 1.21 1.01 1.09  CALCIUM 8.9 8.6* 8.8*    CBC: Recent Labs  Lab 04/20/22 0518 04/21/22 0208 04/22/22 0152  WBC 9.1 8.0 5.9  NEUTROABS 7.8*  --   --   HGB 15.5  14.0 14.4  HCT 48.0 42.1 43.8  MCV 92.7 89.4 89.8  PLT 116* 101* 105*    LFT Recent Labs  Lab 04/20/22 0518  AST 20  ALT 9  ALKPHOS 74  BILITOT 1.0  PROT 6.7  ALBUMIN 3.7     Antibiotics: Anti-infectives (From admission, onward)    Start     Dose/Rate Route Frequency Ordered Stop   04/21/22 1200  azithromycin (ZITHROMAX) 500 mg in sodium chloride 0.9 % 250 mL IVPB        500 mg 250 mL/hr over 60 Minutes Intravenous Every 24 hours 04/20/22 1500 04/26/22 1159   04/21/22 1000  cefTRIAXone (ROCEPHIN) 2 g in sodium  chloride 0.9 % 100 mL IVPB        2 g 200 mL/hr over 30 Minutes Intravenous Every 24 hours 04/20/22 1500 04/26/22 0959   04/20/22 1515  cefTRIAXone (ROCEPHIN) 1 g in sodium chloride 0.9 % 100 mL IVPB        1 g 200 mL/hr over 30 Minutes Intravenous  Once 04/20/22 1508 04/20/22 2130   04/20/22 0830  azithromycin (ZITHROMAX) 500 mg in sodium chloride 0.9 % 250 mL IVPB        500 mg 250 mL/hr over 60 Minutes Intravenous  Once 04/20/22 0828 04/20/22 1149   04/20/22 0830  cefTRIAXone (ROCEPHIN) 1 g in sodium chloride 0.9 % 100 mL IVPB        1 g 200 mL/hr over 30 Minutes Intravenous  Once 04/20/22 0828 04/20/22 0958        DVT prophylaxis: Lovenox  Code Status: Full code  Family Communication: No family at bedside   CONSULTS    Objective    Physical Examination:  General-appears in no acute distress Heart-S1-S2, regular, no murmur auscultated Lungs-clear to auscultation bilaterally, no wheezing or crackles auscultated Abdomen-soft, nontender, no organomegaly Extremities-no edema in the lower extremities Neuro-alert, oriented x3, no focal deficit noted  Status is: Inpatient:        Meredeth Ide   [facility] If 7PM-7AM, please contact night-coverage at [email or web address], Office  [phone]   04/22/2022, 2:41 PM  LOS: 0 days

## 2022-04-22 NOTE — Plan of Care (Signed)
  Problem: Education: Goal: Knowledge of General Education information will improve Description: Including pain rating scale, medication(s)/side effects and non-pharmacologic comfort measures Outcome: Progressing   Problem: Health Behavior/Discharge Planning: Goal: Ability to manage health-related needs will improve Outcome: Progressing   Problem: Clinical Measurements: Goal: Will remain free from infection Outcome: Progressing   

## 2022-04-23 DIAGNOSIS — I482 Chronic atrial fibrillation, unspecified: Secondary | ICD-10-CM | POA: Diagnosis not present

## 2022-04-23 DIAGNOSIS — J189 Pneumonia, unspecified organism: Secondary | ICD-10-CM | POA: Diagnosis not present

## 2022-04-23 DIAGNOSIS — I25810 Atherosclerosis of coronary artery bypass graft(s) without angina pectoris: Secondary | ICD-10-CM | POA: Diagnosis not present

## 2022-04-23 DIAGNOSIS — I5042 Chronic combined systolic (congestive) and diastolic (congestive) heart failure: Secondary | ICD-10-CM | POA: Diagnosis not present

## 2022-04-23 MED ORDER — AZITHROMYCIN 500 MG PO TABS
500.0000 mg | ORAL_TABLET | ORAL | Status: DC
  Administered 2022-04-23: 500 mg via ORAL
  Filled 2022-04-23 (×3): qty 1

## 2022-04-23 NOTE — Progress Notes (Signed)
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  Patient was found to be dyspneic requiring oxygen.  Patient was admitted with healthcare associated pneumonia, started on antibiotics.   Subjective   Patient seen and examined, denies shortness of breath   Assessment/Plan:    Community-acquired pneumonia -Presented with shortness of breath, generalized pain and cough -Chest x-ray showed right upper lobe pneumonia. -Patient started on ceftriaxone and Zithromax -Blood cultures x2 are negative to date -Procalcitonin 1.20 > 0.81, lactic acid 1.4  Chronic atrial fibrillation -CHA2D2s-vasc score of 6 -eliquis held after ICH in 06/2021; however, her has never followed up with cardiology to see about starting this back up Would recommend further discussion with cards due to high risk of stroke Continue VTE prophylaxis for now  On no rate controlling agent  -Discussed with cardiologist on-call.  Recommend to follow-up with primary cardiologist Dr. 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 -Continue blood pressure control -Neurology recommended in November 2022 to follow-up with cardiology -Patient has not seen cardiologist since then  CAD s/p CABG without angina  pectoris -Stable -Continue medical management  Hyperlipidemia -Continue Lipitor  Thrombocytopenia -Chronic -We will monitor in the hospital    Medications     atorvastatin  10 mg Oral Daily   azithromycin  500 mg Oral Q24H   diclofenac Sodium  2 g Topical TID   enoxaparin (LOVENOX) injection  40 mg Subcutaneous Q24H   famotidine  10 mg Oral Daily   furosemide  20 mg Oral Daily   gabapentin  100 mg Oral TID   hydrALAZINE  25 mg Oral Q8H   losartan  50 mg Oral Daily   senna-docusate  1 tablet Oral QHS   sodium chloride flush  3 mL Intravenous Q12H   tamsulosin  0.4 mg Oral QPC supper     Data Reviewed:   CBG:  Recent Labs  Lab 04/20/22 2140  GLUCAP 114*    SpO2: 99 %    Vitals:   04/22/22 2056 04/22/22 2100 04/23/22 0333 04/23/22 0820  BP: (!) 155/102 (!) 155/102 121/86 (!) 147/87  Pulse: (!) 58 (!) 58 (!) 53 (!) 50  Resp:   16 17  Temp: 97.6 F (36.4 C) 97.6 F (36.4 C) 98.1 F (36.7 C) 98.1 F (36.7 C)  TempSrc: Oral Oral Oral Oral  SpO2: 100% 100% 99% 99%  Height:          Data Reviewed:  Basic Metabolic Panel: Recent Labs  Lab 04/20/22 0518 04/21/22 0208 04/22/22 0152  NA 140 138 137  K 3.6 3.6 3.8  CL 105 105 104  CO2 23 24 24   GLUCOSE 91 93 83  BUN 19 17 15   CREATININE 1.21 1.01 1.09  CALCIUM 8.9 8.6* 8.8*    CBC: Recent Labs  Lab 04/20/22 0518 04/21/22 0208 04/22/22 0152  WBC 9.1 8.0 5.9  NEUTROABS 7.8*  --   --  HGB 15.5 14.0 14.4  HCT 48.0 42.1 43.8  MCV 92.7 89.4 89.8  PLT 116* 101* 105*    LFT Recent Labs  Lab 04/20/22 0518  AST 20  ALT 9  ALKPHOS 74  BILITOT 1.0  PROT 6.7  ALBUMIN 3.7     Antibiotics: Anti-infectives (From admission, onward)    Start     Dose/Rate Route Frequency Ordered Stop   04/23/22 1200  azithromycin (ZITHROMAX) tablet 500 mg        500 mg Oral Every 24 hours 04/23/22 0715 04/26/22 1159   04/21/22 1200  azithromycin (ZITHROMAX) 500 mg in sodium chloride 0.9 % 250 mL IVPB   Status:  Discontinued        500 mg 250 mL/hr over 60 Minutes Intravenous Every 24 hours 04/20/22 1500 04/23/22 0714   04/21/22 1000  cefTRIAXone (ROCEPHIN) 2 g in sodium chloride 0.9 % 100 mL IVPB        2 g 200 mL/hr over 30 Minutes Intravenous Every 24 hours 04/20/22 1500 04/26/22 0959   04/20/22 1515  cefTRIAXone (ROCEPHIN) 1 g in sodium chloride 0.9 % 100 mL IVPB        1 g 200 mL/hr over 30 Minutes Intravenous  Once 04/20/22 1508 04/20/22 2130   04/20/22 0830  azithromycin (ZITHROMAX) 500 mg in sodium chloride 0.9 % 250 mL IVPB        500 mg 250 mL/hr over 60 Minutes Intravenous  Once 04/20/22 0828 04/20/22 1149   04/20/22 0830  cefTRIAXone (ROCEPHIN) 1 g in sodium chloride 0.9 % 100 mL IVPB        1 g 200 mL/hr over 30 Minutes Intravenous  Once 04/20/22 0828 04/20/22 0958        DVT prophylaxis: Lovenox  Code Status: Full code  Family Communication: No family at bedside   CONSULTS    Objective    Physical Examination:  General-appears in no acute distress Heart-S1-S2, regular, no murmur auscultated Lungs-clear to auscultation bilaterally, no wheezing or crackles auscultated Abdomen-soft, nontender, no organomegaly Extremities-no edema in the lower extremities Neuro-alert, oriented x3, no focal deficit noted  Status is: Inpatient:        Meredeth Ide   [facility] If 7PM-7AM, please contact night-coverage at [email or web address], Office  [phone]   04/23/2022, 11:47 AM  LOS: 1 day

## 2022-04-23 NOTE — Plan of Care (Signed)
?  Problem: Activity: ?Goal: Ability to tolerate increased activity will improve ?Outcome: Progressing ?  ?Problem: Education: ?Goal: Knowledge of General Education information will improve ?Description: Including pain rating scale, medication(s)/side effects and non-pharmacologic comfort measures ?Outcome: Progressing ?  ?Problem: Health Behavior/Discharge Planning: ?Goal: Ability to manage health-related needs will improve ?Outcome: Progressing ?  ?

## 2022-04-24 DIAGNOSIS — I482 Chronic atrial fibrillation, unspecified: Secondary | ICD-10-CM | POA: Diagnosis not present

## 2022-04-24 DIAGNOSIS — J189 Pneumonia, unspecified organism: Secondary | ICD-10-CM | POA: Diagnosis not present

## 2022-04-24 DIAGNOSIS — I5042 Chronic combined systolic (congestive) and diastolic (congestive) heart failure: Secondary | ICD-10-CM | POA: Diagnosis not present

## 2022-04-24 DIAGNOSIS — I25810 Atherosclerosis of coronary artery bypass graft(s) without angina pectoris: Secondary | ICD-10-CM | POA: Diagnosis not present

## 2022-04-24 LAB — LEGIONELLA PNEUMOPHILA SEROGP 1 UR AG: L. pneumophila Serogp 1 Ur Ag: NEGATIVE

## 2022-04-24 MED ORDER — AMOXICILLIN-POT CLAVULANATE 875-125 MG PO TABS: 1.0000 | ORAL_TABLET | Freq: Two times a day (BID) | ORAL | 0 refills | Status: AC

## 2022-04-24 MED ORDER — TRAMADOL HCL 50 MG PO TABS: 50.0000 mg | ORAL_TABLET | Freq: Four times a day (QID) | ORAL | 0 refills | Status: AC | PRN

## 2022-04-24 NOTE — Discharge Summary (Addendum)
Physician Discharge Summary   Patient: Carlos Welch MRN: [id number] DOB: November 03, 1945  Admit date:     04/20/2022  Discharge date: 04/24/22  Discharge Physician: Meredeth Ide   PCP: [name], PA-C   Recommendations at discharge:   Follow-up cardiology on 05/01/2022 at 8:25 AM to discuss regarding restarting Eliquis Will need repeat chest x-ray in 3 to 4 weeks to check for resolution of pneumonia  Discharge Diagnoses: Principal Problem:   HCAP (healthcare-associated pneumonia) Active Problems:   Chronic atrial fibrillation (HCC)   Chronic combined systolic and diastolic congestive heart failure (HCC)   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 deficits    Essential hypertension   Coronary artery disease involving coronary bypass graft of native heart without angina pectoris   Hyperlipemia   Thrombocytopenia (HCC)   Pneumonia  Resolved Problems:   * No resolved hospital problems. 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  Patient was found to be dyspneic requiring oxygen.  Patient was admitted with healthcare associated pneumonia, started on antibiotics.    Assessment and Plan:    Community-acquired pneumonia -Presented with shortness of breath, generalized pain and cough -Chest x-ray showed right upper lobe pneumonia. -Patient started on ceftriaxone and Zithromax -Blood cultures x2 are negative to date -Procalcitonin 1.20 > 0.81, lactic acid 1.4 -We will discharge on Augmentin 1 tablet p.o. twice daily for 3 more days -Will need repeat chest x-ray in 3 to 4 weeks as outpatient to check for resolution of pneumonia   Chronic atrial fibrillation -CHA2D2s-vasc score of  6 -eliquis held after ICH in 06/2021; however, her has never followed up with cardiology to see about starting this back up Would recommend further discussion with cards due to high risk of stroke Continue VTE prophylaxis for now  On no rate controlling agent  -Discussed with cardiologist on-call.  Recommend to follow-up with primary cardiologist Dr. 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 -Continue blood pressure control -Neurology recommended in November 2022 to follow-up with cardiology -Patient has not seen cardiologist since then   CAD s/p CABG without angina pectoris -Stable -Continue medical management   Hyperlipidemia -Continue Lipitor   Thrombocytopenia -Chronic         Consultants:  Procedures performed:  Disposition: Skilled nursing facility Diet recommendation:  Discharge Diet Orders (From admission, onward)     Start     Ordered   04/24/22 0000  Diet - low sodium heart healthy        04/24/22 1044           Cardiac diet DISCHARGE MEDICATION: Allergies as of 04/24/2022       Reactions   Chocolate Rash   9.9.2022 Daughter reports that the patient regularly eats chocolate without any issues.   Peanut-containing Drug Products Rash        Medication List     TAKE these medications    acetaminophen 325 MG tablet Commonly known as: TYLENOL Take 2 tablets (650 mg total)  by mouth every 4 (four) hours as needed for mild pain (or temp > 37.5 C (99.5 F)).   amoxicillin-clavulanate 875-125 MG tablet Commonly known as: AUGMENTIN Take 1 tablet by mouth 2 (two) times daily for 3 days.   atorvastatin 10 MG tablet Commonly known  as: LIPITOR Take 10 mg by mouth daily.   diclofenac Sodium 1 % Gel Commonly known as: VOLTAREN Apply 1 Application topically 3 (three) times daily. Apply to right knee topically three times a day for right knee pain.   famotidine 10 MG tablet Commonly known as: PEPCID Take 10 mg by mouth daily.   furosemide 20 MG tablet Commonly known as: LASIX Take 1 tablet (20 mg total) by mouth daily.   gabapentin 100 MG capsule Commonly known as: NEURONTIN Take 1 capsule (100 mg total) by mouth 3 (three) times daily. What changed: how much to take   hydrALAZINE 25 MG tablet Commonly known as: APRESOLINE Take 1 tablet (25 mg total) by mouth every 8 (eight) hours.   losartan 50 MG tablet Commonly known as: COZAAR Take 1 tablet (50 mg total) by mouth daily.   pantoprazole 40 MG tablet Commonly known as: PROTONIX Take 1 tablet (40 mg total) by mouth daily.   senna-docusate 8.6-50 MG tablet Commonly known as: Senokot-S Take 1 tablet by mouth at bedtime.   tamsulosin 0.4 MG Caps capsule Commonly known as: FLOMAX Take 1 capsule (0.4 mg total) by mouth daily after supper.   traMADol 50 MG tablet Commonly known as: ULTRAM Take 1 tablet (50 mg total) by mouth every 6 (six) hours as needed for severe pain or moderate pain. What changed: when to take this        Follow-up Information     Ronney Asters, NP Follow up on 05/01/2022.   Specialty: Cardiology Why: 8:25 am follow up with cardiology Contact information: [address] [place] [address] [phone]                Discharge Exam: There were no vitals filed for this visit. General-appears in no acute distress Heart-S1-S2, regular, no murmur auscultated Lungs-clear to auscultation bilaterally, no wheezing or crackles auscultated Abdomen-soft, nontender, no organomegaly Extremities-no edema in the lower extremities Neuro-alert, oriented x3, no focal deficit noted  Condition at discharge:  good  The results of significant diagnostics from this hospitalization (including imaging, microbiology, ancillary and laboratory) are listed below for reference.   Imaging Studies: CT Head Wo Contrast  Result Date: 04/20/2022 CLINICAL DATA:  Fall, mental status change. EXAM: CT HEAD WITHOUT CONTRAST TECHNIQUE: Contiguous axial images were obtained from the base of the skull through the vertex without intravenous contrast. RADIATION DOSE REDUCTION: This exam was performed according to the departmental dose-optimization program which includes automated exposure control, adjustment of the mA and/or kV according to patient size and/or use of iterative reconstruction technique. COMPARISON:  CT examination dated June 30, 2021 FINDINGS: Brain: No evidence of acute infarction, hemorrhage, hydrocephalus, extra-axial collection or mass lesion/mass effect. Diffuse low-attenuation of the periventricular and subcortical white matter presumed chronic microvascular ischemic changes. Moderate cerebral volume loss. Vascular: No hyperdense vessel or unexpected calcification. Skull: Normal. Negative for fracture or focal lesion. Sinuses/Orbits: No acute finding. Other: None. IMPRESSION: 1.  No acute intracranial abnormality. 2. Moderate cerebral atrophy and advanced chronic microvascular ischemic changes, unchanged. Electronically Signed   By: Larose Hires D.O.   On: 04/20/2022 14:59   DG Chest 2 View  Result Date: 04/20/2022 CLINICAL DATA:  Chest pain with shortness of breath and cough EXAM: CHEST - 2 VIEW COMPARISON:  06/23/2021 FINDINGS: Interstitial opacity diffusely with right upper lobe consolidation. Chronic right pleural effusion with areas of loculation prior CT. Chronic cardiomegaly. There has been CABG and left atrial clipping with aortic valve replacement. Stable mediastinal contours. IMPRESSION: 1. Right upper lobe pneumonia. Followup PA and lateral chest X-ray is recommended in 3-4 weeks to ensure  resolution. 2. Generalized interstitial opacity from chronic lung disease or pulmonary edema. 3. Chronic small right pleural effusion. Electronically Signed   By: Tiburcio Pea M.D.   On: 04/20/2022 05:49    Microbiology: Results for orders placed or performed during the hospital encounter of 04/20/22  Respiratory (~20 pathogens) panel by PCR     Status: Abnormal   Collection Time: 04/20/22  3:16 AM   Specimen: Nasopharyngeal Swab; Respiratory  Result Value Ref Range Status   Adenovirus NOT DETECTED NOT DETECTED Final   Coronavirus 229E NOT DETECTED NOT DETECTED Final    Comment: (NOTE) The Coronavirus on the Respiratory Panel, DOES NOT test for the novel  Coronavirus (2019 nCoV)    Coronavirus HKU1 NOT DETECTED NOT DETECTED Final   Coronavirus NL63 NOT DETECTED NOT DETECTED Final   Coronavirus OC43 NOT DETECTED NOT DETECTED Final   Metapneumovirus NOT DETECTED NOT DETECTED Final   Rhinovirus / Enterovirus DETECTED (A) NOT DETECTED Final   Influenza A NOT DETECTED NOT DETECTED Final   Influenza B NOT DETECTED NOT DETECTED Final   Parainfluenza Virus 1 NOT DETECTED NOT DETECTED Final   Parainfluenza Virus 2 NOT DETECTED NOT DETECTED Final   Parainfluenza Virus 3 NOT DETECTED NOT DETECTED Final   Parainfluenza Virus 4 NOT DETECTED NOT DETECTED Final   Respiratory Syncytial Virus NOT DETECTED NOT DETECTED Final   Bordetella pertussis NOT DETECTED NOT DETECTED Final   Bordetella Parapertussis NOT DETECTED NOT DETECTED Final   Chlamydophila pneumoniae NOT DETECTED NOT DETECTED Final   Mycoplasma pneumoniae NOT DETECTED NOT DETECTED Final    Comment: Performed at [facility], [address] [place], [place] [address]  Culture, blood (Routine X 2) w Reflex to ID Panel     Status: None (Preliminary result)   Collection Time: 04/20/22  3:40 PM   Specimen: BLOOD RIGHT WRIST  Result Value Ref Range Status   Specimen Description BLOOD RIGHT WRIST  Final   Special Requests   Final     BOTTLES DRAWN AEROBIC AND ANAEROBIC Blood Culture adequate volume   Culture   Final    NO GROWTH 4 DAYS Performed at [facility], [address] [place], [place] [address]    Report Status PENDING  Incomplete  Culture, blood (Routine X 2) w Reflex to ID Panel     Status: None (Preliminary result)   Collection Time: 04/20/22  3:41 PM   Specimen: BLOOD  Result Value Ref Range Status   Specimen Description BLOOD RIGHT ANTECUBITAL  Final   Special Requests   Final    BOTTLES DRAWN AEROBIC AND ANAEROBIC Blood Culture adequate volume   Culture   Final    NO GROWTH 4 DAYS Performed at [facility], [address], [place], [place] [address]    Report Status PENDING  Incomplete  SARS Coronavirus 2 by RT PCR (hospital order, performed in [facility] hospital lab) *cepheid single result test*     Status: None   Collection Time: 04/21/22  3:16 AM  Result Value Ref Range Status  SARS Coronavirus 2 by RT PCR NEGATIVE NEGATIVE Final    Comment: (NOTE) SARS-CoV-2 target nucleic acids are NOT DETECTED.  The SARS-CoV-2 RNA is generally detectable in upper and lower respiratory specimens during the acute phase of infection. The lowest concentration of SARS-CoV-2 viral copies this assay can detect is 250 copies / mL. A negative result does not preclude SARS-CoV-2 infection and should not be used as the sole basis for treatment or other patient management decisions.  A negative result may occur with improper specimen collection / handling, submission of specimen other than nasopharyngeal swab, presence of viral mutation(s) within the areas targeted by this assay, and inadequate number of viral copies (<250 copies / mL). A negative result must be combined with clinical observations, patient history, and epidemiological information.  Fact Sheet for Patients:   RoadLapTop.co.za  Fact Sheet for Healthcare  Providers: http://kim-miller.com/  This test is not yet approved or  cleared by the [place] FDA and has been authorized for detection and/or diagnosis of SARS-CoV-2 by FDA under an Emergency Use Authorization (EUA).  This EUA will remain in effect (meaning this test can be used) for the duration of the COVID-19 declaration under Section 564(b)(1) of the Act, 21 U.S.C. section 360bbb-3(b)(1), unless the authorization is terminated or revoked sooner.  Performed at [facility], [address], [place], [place] [address]     Labs: CBC: Recent Labs  Lab 04/20/22 0518 04/21/22 0208 04/22/22 0152  WBC 9.1 8.0 5.9  NEUTROABS 7.8*  --   --   HGB 15.5 14.0 14.4  HCT 48.0 42.1 43.8  MCV 92.7 89.4 89.8  PLT 116* 101* 105*   Basic Metabolic Panel: Recent Labs  Lab 04/20/22 0518 04/21/22 0208 04/22/22 0152  NA 140 138 137  K 3.6 3.6 3.8  CL 105 105 104  CO2 23 24 24   GLUCOSE 91 93 83  BUN 19 17 15   CREATININE 1.21 1.01 1.09  CALCIUM 8.9 8.6* 8.8*   Liver Function Tests: Recent Labs  Lab 04/20/22 0518  AST 20  ALT 9  ALKPHOS 74  BILITOT 1.0  PROT 6.7  ALBUMIN 3.7   CBG: Recent Labs  Lab 04/20/22 2140  GLUCAP 114*    Discharge time spent: greater than 30 minutes.  Signed: 06/21/22, MD [facility] 04/24/2022

## 2022-04-24 NOTE — TOC Initial Note (Signed)
Transition of Care [facility]) - Initial/Assessment Note    Patient Details  Name: Carlos Welch MRN: [id number] Date of Birth: Jun 09, 1946  Transition of Care [facility]) CM/SW Contact:    Lorri Frederick, LCSW Phone Number: 04/24/2022, 10:46 AM  Clinical Narrative:    CSW spoke with pt regarding DC plan.  Pt is long term care at [facility], reports he hopes to "get home" at some point, but agreeable to return to [facility] today.  Permission given to speak with daughter Herbert Seta.  Kathy/[facility] confirmed that pt is LTC and can return today.  CSW spoke with daughter Herbert Seta, who was already aware that pt will be DC back to [facility] today.                 Expected Discharge Plan: Long Term Nursing Home Barriers to Discharge: No Barriers Identified   Patient Goals and CMS Choice Patient states their goals for this hospitalization and ongoing recovery are:: get back home   Choice offered to / list presented to : Patient, Adult Children (daughter Herbert Seta)  Expected Discharge Plan and Services Expected Discharge Plan: Long Term Nursing Home In-house Referral: Clinical Social Work   Post Acute Care Choice: Skilled Nursing Facility Living arrangements for the past 2 months: Skilled Nursing Facility Expected Discharge Date: 04/24/22                                    Prior Living Arrangements/Services Living arrangements for the past 2 months: Skilled Nursing Facility Lives with:: Facility Resident Patient language and need for interpreter reviewed:: Yes Do you feel safe going back to the place where you live?: Yes      Need for Family Participation in Patient Care: Yes (Comment) Care giver support system in place?: Yes (comment) Current home services: Other (comment) (na) Criminal Activity/Legal Involvement Pertinent to Current Situation/Hospitalization: No - Comment as needed  Activities of Daily Living Home Assistive Devices/Equipment: Cane (specify quad or straight), Wheelchair,  Dentures (specify type), Eyeglasses ADL Screening (condition at time of admission) Patient's cognitive ability adequate to safely complete daily activities?: Yes Is the patient deaf or have difficulty hearing?: No Does the patient have difficulty seeing, even when wearing glasses/contacts?: Yes Does the patient have difficulty concentrating, remembering, or making decisions?: No Patient able to express need for assistance with ADLs?: Yes Does the patient have difficulty dressing or bathing?: No Independently performs ADLs?: No Communication: Independent Dressing (OT): Independent Grooming: Independent Feeding: Needs assistance (with setting up but can feed himself with left arm) Is this a change from baseline?: Pre-admission baseline Bathing: Independent with device (comment) In/Out Bed: Independent with device (comment) (use wheelchair) Walks in Home: Independent with device (comment) Does the patient have difficulty walking or climbing stairs?: Yes Weakness of Legs: Right Weakness of Arms/Hands: Right  Permission Sought/Granted Permission sought to share information with : Family Supports Permission granted to share information with : Yes, Verbal Permission Granted  Share Information with NAME: daughter Herbert Seta  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        Emotional Assessment Appearance:: Appears stated age Attitude/Demeanor/Rapport: Engaged Affect (typically observed): Appropriate Orientation: : Oriented to Self, Oriented to Place, Oriented to  Time, Oriented to Situation Alcohol / Substance Use: Not Applicable Psych Involvement: No (comment)  Admission diagnosis:  HCAP (healthcare-associated pneumonia) [J18.9] Pneumonia [J18.9] Patient Active Problem List   Diagnosis Date Noted   Pneumonia 04/22/2022  HCAP (healthcare-associated pneumonia) 04/20/2022   Chronic combined systolic and diastolic congestive heart failure (HCC)    Dysphagia,  post-stroke    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 deficits  06/27/2021   Abscess of jaw 07/19/2020   Admission for palliative care 07/19/2020   Aortic valve stenosis 07/19/2020   Cognitive communication deficit 07/19/2020   Coronary artery disease involving coronary bypass graft of native heart without angina pectoris 07/19/2020   Debility 07/19/2020   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   Nonrheumatic mitral valve regurgitation 07/19/2020   Severe malnutrition (HCC) 07/19/2020   TIA (transient ischemic attack) 07/19/2020   Weight loss 07/19/2020   BPH (benign prostatic hyperplasia) 07/08/2020   Cellulitis of lower extremity 07/08/2020   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 [place] [address] Phone: [phone] Fax: [phone]     Social Determinants of Health (SDOH) Interventions    Readmission Risk Interventions     No data to display

## 2022-04-24 NOTE — NC FL2 (Signed)
[place] MEDICAID FL2 LEVEL OF CARE SCREENING TOOL     IDENTIFICATION  Patient Name: Carlos Welch Birthdate: [birth year] Sex: male Admission Date (Current Location): 04/20/2022  [facility] and [place] Number:  Producer, television/film/video and Address:  [facility] [place], [address], [place], [place] [address]      Provider Number: [id number]  Attending Physician Name and Address:  Meredeth Ide, MD  Relative Name and Phone Number:  Blanchie Serve Daughter   [phone]    Current Level of Care: Hospital Recommended Level of Care: Skilled Nursing Facility Prior Approval Number:    Date Approved/Denied:   PASRR Number: [id number]  Discharge Plan: SNF    Current Diagnoses: Patient Active Problem List   Diagnosis Date Noted   Pneumonia 04/22/2022   HCAP (healthcare-associated pneumonia) 04/20/2022   Chronic combined systolic and diastolic congestive heart failure (HCC)    Dysphagia, post-stroke    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 deficits  06/27/2021   Abscess of jaw 07/19/2020   Admission for palliative care 07/19/2020   Aortic valve stenosis 07/19/2020   Cognitive communication deficit 07/19/2020   Coronary artery disease involving coronary bypass graft of native heart without angina pectoris 07/19/2020   Debility 07/19/2020   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   Nonrheumatic mitral valve regurgitation 07/19/2020   Severe malnutrition (HCC) 07/19/2020   TIA (transient ischemic attack) 07/19/2020   Weight loss 07/19/2020   BPH (benign prostatic hyperplasia) 07/08/2020   Cellulitis of lower extremity 07/08/2020   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/08/2020   Essential hypertension 07/08/2020   Hyperlipemia 07/08/2020   Normocytic anemia 07/08/2020   Thrombocytopenia (HCC) 07/08/2020   Chronic atrial fibrillation (HCC) 07/08/2020   Pressure injury of skin 05/12/2020    Orientation RESPIRATION  BLADDER Height & Weight     Self, Time, Situation, Place  Normal Incontinent Weight:   Height:  6\' 2"  (188 cm)  BEHAVIORAL SYMPTOMS/MOOD NEUROLOGICAL BOWEL NUTRITION STATUS      Continent Diet (see discharge summary)  AMBULATORY STATUS COMMUNICATION OF NEEDS Skin   Limited Assist Verbally Normal                       Personal Care Assistance Level of Assistance  Bathing, Feeding, Dressing Bathing Assistance: Limited assistance Feeding assistance: Independent Dressing Assistance: Limited assistance     Functional Limitations Info  Sight, Hearing, Speech Sight Info: Adequate Hearing Info: Adequate Speech Info: Adequate    SPECIAL CARE FACTORS FREQUENCY                       Contractures Contractures Info: Not present    Additional Factors Info  Code Status, Allergies Code Status Info: full Allergies Info: Chocolate, Peanut-containing Drug Products           Current Medications (04/24/2022):  This is the current hospital active medication list Current Facility-Administered Medications  Medication Dose Route Frequency Provider Last Rate Last Admin   0.9 %  sodium chloride infusion  250 mL Intravenous PRN 06/25/2022, MD       acetaminophen (TYLENOL) tablet 650 mg  650 mg Oral Q6H PRN Orland Mustard, MD   650 mg at 04/21/22 06/22/22   Or   acetaminophen (TYLENOL) suppository 650 mg  650 mg Rectal Q6H PRN 0086, MD       artificial tears (LACRILUBE) ophthalmic ointment   Left Eye Q4H PRN  Allene Dillon, NP       atorvastatin (LIPITOR) tablet 10 mg  10 mg Oral Daily Orland Mustard, MD   10 mg at 04/24/22 0948   azithromycin (ZITHROMAX) tablet 500 mg  500 mg Oral Q24H Sharl Ma, Gagan S, MD   500 mg at 04/23/22 1501   cefTRIAXone (ROCEPHIN) 2 g in sodium chloride 0.9 % 100 mL IVPB  2 g Intravenous Q24H Orland Mustard, MD 200 mL/hr at 04/24/22 0956 2 g at 04/24/22 0956   diclofenac Sodium (VOLTAREN) 1 % topical gel 2 g  2 g Topical TID Orland Mustard, MD   2 g  at 04/23/22 2127   enoxaparin (LOVENOX) injection 40 mg  40 mg Subcutaneous Q24H Orland Mustard, MD   40 mg at 04/23/22 2126   famotidine (PEPCID) tablet 10 mg  10 mg Oral Daily Orland Mustard, MD   10 mg at 04/24/22 0948   furosemide (LASIX) tablet 20 mg  20 mg Oral Daily Orland Mustard, MD   20 mg at 04/24/22 0948   gabapentin (NEURONTIN) capsule 100 mg  100 mg Oral TID Orland Mustard, MD   100 mg at 04/24/22 0947   guaiFENesin-dextromethorphan (ROBITUSSIN DM) 100-10 MG/5ML syrup 5 mL  5 mL Oral Q4H PRN Orland Mustard, MD       hydrALAZINE (APRESOLINE) tablet 25 mg  25 mg Oral Q8H Orland Mustard, MD   25 mg at 04/24/22 0630   losartan (COZAAR) tablet 50 mg  50 mg Oral Daily Orland Mustard, MD   50 mg at 04/24/22 0947   ondansetron (ZOFRAN) tablet 4 mg  4 mg Oral Q6H PRN Orland Mustard, MD       Or   ondansetron [facility]) injection 4 mg  4 mg Intravenous Q6H PRN Orland Mustard, MD       Oral care mouth rinse  15 mL Mouth Rinse PRN Meredeth Ide, MD       senna-docusate (Senokot-S) tablet 1 tablet  1 tablet Oral QHS Orland Mustard, MD   1 tablet at 04/20/22 2151   sodium chloride flush (NS) 0.9 % injection 3 mL  3 mL Intravenous Q12H Orland Mustard, MD   3 mL at 04/24/22 0949   sodium chloride flush (NS) 0.9 % injection 3 mL  3 mL Intravenous PRN Orland Mustard, MD       tamsulosin [facility]) capsule 0.4 mg  0.4 mg Oral QPC supper Orland Mustard, MD   0.4 mg at 04/23/22 1849   traMADol (ULTRAM) tablet 50 mg  50 mg Oral Q12H PRN Orland Mustard, MD   50 mg at 04/23/22 2142     Discharge Medications: Please see discharge summary for a list of discharge medications.  Relevant Imaging Results:  Relevant Lab Results:   Additional Information SS#[id number]  Lorri Frederick, LCSW

## 2022-04-24 NOTE — Plan of Care (Signed)
  Problem: Education: Goal: Knowledge of General Education information will improve Description: Including pain rating scale, medication(s)/side effects and non-pharmacologic comfort measures Outcome: Progressing   Problem: Activity: Goal: Ability to tolerate increased activity will improve Outcome: Progressing   

## 2022-04-24 NOTE — TOC Transition Note (Signed)
Transition of Care [facility]) - CM/SW Discharge Note   Patient Details  Name: Carlos Welch MRN: [id number] Date of Birth: [birth year]  Transition [facility] CM/SW Contact:  Lorri Frederick, LCSW Phone Number: 04/24/2022, 10:59 AM   Clinical Narrative:   Pt discharging to [facility].  RN call [phone] for report.     Final next level of care: Skilled Nursing Facility Barriers to Discharge: No Barriers Identified   Patient Goals and CMS Choice Patient states their goals for this hospitalization and ongoing recovery are:: get back home   Choice offered to / list presented to : Patient, Adult Children (daughter Herbert Seta)  Discharge Placement              Patient chooses bed at: [facility] Patient to be transferred to facility by: [facility] Name of family member notified: daughter Herbert Seta Patient and family notified of of transfer: 04/24/22  Discharge Plan and Services In-house Referral: Clinical Social Work   Post Acute Care Choice: Skilled Nursing Facility                               Social Determinants of Health (SDOH) Interventions     Readmission Risk Interventions     No data to display

## 2022-04-25 LAB — CULTURE, BLOOD (ROUTINE X 2)
Culture: NO GROWTH
Culture: NO GROWTH
Special Requests: ADEQUATE
Special Requests: ADEQUATE

## 2022-04-27 ENCOUNTER — Other Ambulatory Visit: Payer: Self-pay

## 2022-04-27 ENCOUNTER — Emergency Department (HOSPITAL_COMMUNITY)
Admission: EM | Admit: 2022-04-27 | Discharge: 2022-04-28 | Disposition: A | Discharge status: Home / Self Care | Payer: Medicare Other | Attending: Emergency Medicine | Admitting: Emergency Medicine

## 2022-04-27 DIAGNOSIS — M79601 Pain in right arm: Secondary | ICD-10-CM

## 2022-04-27 DIAGNOSIS — R519 Headache, unspecified: Secondary | ICD-10-CM | POA: Insufficient documentation

## 2022-04-27 DIAGNOSIS — I509 Heart failure, unspecified: Secondary | ICD-10-CM | POA: Insufficient documentation

## 2022-04-27 DIAGNOSIS — Z79899 Other long term (current) drug therapy: Secondary | ICD-10-CM | POA: Diagnosis not present

## 2022-04-27 DIAGNOSIS — Z91010 Allergy to peanuts: Secondary | ICD-10-CM | POA: Diagnosis not present

## 2022-04-27 DIAGNOSIS — M79621 Pain in right upper arm: Secondary | ICD-10-CM | POA: Diagnosis present

## 2022-04-27 DIAGNOSIS — M79604 Pain in right leg: Secondary | ICD-10-CM | POA: Diagnosis not present

## 2022-04-27 MED ORDER — HYDROCODONE-ACETAMINOPHEN 5-325 MG PO TABS
1.0000 | ORAL_TABLET | Freq: Once | ORAL | Status: AC
  Administered 2022-04-28: 1 via ORAL
  Filled 2022-04-27: qty 1

## 2022-04-27 NOTE — ED Triage Notes (Signed)
Pt bib [facility] from [facility] c/o chronic generalized nerve pain, worse on R side. Pt given tramadol PTA, no improvement. Refused further treatment from facility. Hx stroke, R side deficits noted. Axo x4  BP 150/98 HR 81 SPO2 100% CBG 108

## 2022-04-27 NOTE — ED Provider Notes (Signed)
[facility] EMERGENCY DEPARTMENT Provider Note   CSN: [id number] Arrival date & time: 04/27/22  2306     History  Chief Complaint  Patient presents with   nerve pain    Carlos Welch is a 76 y.o. male.  The history is provided by the patient.      Patient with history of previous ICH with resultant right-sided weakness, CHF presents from nursing facility with pain.  Patient reports he has pain throughout the right side of his body.  He reports pain in his face, arm and leg.  No recent falls.  He reports chronic weakness in his right upper and lower extremity that is unchanged.  Home Medications Prior to Admission medications   Medication Sig Start Date End Date Taking? Authorizing Provider  acetaminophen (TYLENOL) 325 MG tablet Take 2 tablets (650 mg total) by mouth every 4 (four) hours as needed for mild pain (or temp > 37.5 C (99.5 F)). Patient not taking: Reported on 04/20/2022 06/27/21   Marguerita Merles Latif, DO  atorvastatin (LIPITOR) 10 MG tablet Take 10 mg by mouth daily. 04/16/22   [provider]  diclofenac Sodium (VOLTAREN) 1 % GEL Apply 1 Application topically 3 (three) times daily. Apply to right knee topically three times a day for right knee pain. 04/19/22   [provider]  famotidine (PEPCID) 10 MG tablet Take 10 mg by mouth daily.    [provider]  furosemide (LASIX) 20 MG tablet Take 1 tablet (20 mg total) by mouth daily. 07/15/21   Angiulli, Mcarthur Rossetti, PA-C  gabapentin (NEURONTIN) 100 MG capsule Take 1 capsule (100 mg total) by mouth 3 (three) times daily. Patient taking differently: Take 200 mg by mouth 3 (three) times daily. 07/14/21   Angiulli, Mcarthur Rossetti, PA-C  hydrALAZINE (APRESOLINE) 25 MG tablet Take 1 tablet (25 mg total) by mouth every 8 (eight) hours. 07/14/21   Angiulli, Mcarthur Rossetti, PA-C  losartan (COZAAR) 50 MG tablet Take 1 tablet (50 mg total) by mouth daily. 07/15/21   Angiulli, Mcarthur Rossetti, PA-C  pantoprazole (PROTONIX)  40 MG tablet Take 1 tablet (40 mg total) by mouth daily. Patient not taking: Reported on 04/20/2022 06/28/21   Marguerita Merles Latif, DO  senna-docusate (SENOKOT-S) 8.6-50 MG tablet Take 1 tablet by mouth at bedtime. 07/14/21   Angiulli, Mcarthur Rossetti, PA-C  tamsulosin (FLOMAX) 0.4 MG CAPS capsule Take 1 capsule (0.4 mg total) by mouth daily after supper. 07/14/21   Angiulli, Mcarthur Rossetti, PA-C  traMADol (ULTRAM) 50 MG tablet Take 1 tablet (50 mg total) by mouth every 6 (six) hours as needed for severe pain or moderate pain. 04/24/22   Meredeth Ide, MD      Allergies    Chocolate and Peanut-containing drug products    Review of Systems   Review of Systems  Constitutional:  Negative for fever.  Musculoskeletal:  Positive for arthralgias.    Physical Exam Updated Vital Signs BP (!) 148/94   Pulse 62   Temp 97.6 F (36.4 C) (Oral)   Resp 19   Ht 1.88 m (6\' 2" )   Wt 78 kg   SpO2 100%   BMI 22.08 kg/m  Physical Exam CONSTITUTIONAL: Elderly, no acute distress HEAD: Normocephalic/atraumatic EYES: EOMI/PERRL ENMT: Mucous membranes moist, no signs of trauma NECK: supple no meningeal signs SPINE/BACK:entire spine nontender LUNGS: Lungs are clear to auscultation bilaterally, no apparent distress ABDOMEN: soft, nontender NEURO: Pt is awake/alert/appropriate, he is able to move all extremities but  appears weaker on the right arm and leg. EXTREMITIES: pulses normal/equal, full ROM Distal pulses intact in all 4 extremities.  No discoloration to his extremities.  Extremities are warm to touch SKIN: warm, color normal   ED Results / Procedures / Treatments   Labs (all labs ordered are listed, but only abnormal results are displayed) Labs Reviewed - No data to display  EKG None  Radiology No results found.  Procedures Procedures    Medications Ordered in ED Medications  HYDROcodone-acetaminophen (NORCO/VICODIN) 5-325 MG per tablet 1 tablet (has no administration in time range)    ED  Course/ Medical Decision Making/ A&P Clinical Course as of 04/28/22 0047  Fri Apr 28, 2022  0041 Patient reports feeling improved.  There are no signs of any acute traumatic injuries to his extremities.  There is no bruising, no erythema he is vascularly intact [DW]  0041 This appears to be a chronic process.  Patient already has pain medications at the facility.  Patient is hesitant to go back to the facility that he reports is because he has to share a room with someone else.  I do not see any reason that he needs to be kept in the emergency department [DW]    Clinical Course User Index [DW] Zadie Rhine, MD                           Medical Decision Making Risk Prescription drug management.   Patient with multiple medical conditions presents with pain.  Patient resides in a nursing facility and was recently in the hospital for pneumonia.  He now presents with pain throughout his right side.  It is reported as chronic.  There are no signs of trauma I have made multiple attempts to call his healthcare facility but no one is answered. Patient apparently demanded transfer to the hospital. No other acute issues at this time        Final Clinical Impression(s) / ED Diagnoses Final diagnoses:  Pain of right upper extremity  Right leg pain    Rx / DC Orders ED Discharge Orders     None         Zadie Rhine, MD 04/28/22 [phone]

## 2022-04-28 NOTE — ED Provider Notes (Signed)
The patient request, I called his daughter but there was no answer.  He can follow-up with social work in the morning if he wants to have a new nursing home   Zadie Rhine, MD 04/28/22 [phone]

## 2022-04-28 NOTE — ED Notes (Signed)
RN reviewed discharge instructions with [facility]. VSS upon discharge. 

## 2022-04-28 NOTE — ED Notes (Signed)
[facility] called to transport pt back to [facility]

## 2022-04-28 NOTE — Progress Notes (Unsigned)
Cardiology Clinic Note   Patient Name: Carlos Welch Date of Encounter: 05/01/2022  Primary Care Provider:  Mackie Pai, PA-C Primary Cardiologist:  Carlos Klein, MD  Patient Profile    Carlos Welch 76 year old presents the clinic today for follow-up evaluation of his atrial fibrillation and chronic CHF.  Past Medical History    Past Medical History:  Diagnosis Date   A-fib ([place])    Abscess of jaw 07/19/2020   Acute on chronic heart failure with preserved ejection fraction ([place]) 07/08/2020   Aortic stenosis    a. s/p pericardial AVR 2015.   Aortic valve stenosis 07/19/2020   Bacteremia 07/19/2020   Bacterial endocarditis    BPH (benign prostatic hyperplasia) 07/08/2020   CAD (coronary artery disease)    a. s/p CABG, AVR, LAA clipping 2015 at [facility].   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/08/2020   Elevated troponin 07/08/2020   Essential hypertension 07/08/2020   Generalized abdominal pain    History of noncompliance with medical treatment    Hyperlipemia 07/08/2020   Infection associated with implant ([place]) 07/19/2020   Malnutrition ([place])    MRSA bacteremia    Nonrheumatic mitral valve regurgitation 07/19/2020   Normocytic anemia 07/08/2020   Pressure injury of skin 05/12/2020   S/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 2015   Severe malnutrition ([place]) 07/19/2020   Thrombocytopenia ([place]) 07/08/2020   TIA (transient ischemic attack)    Weight loss 07/19/2020   Past Surgical History:  Procedure Laterality Date   AORTIC VALVE REPLACEMENT     CORONARY ARTERY BYPASS GRAFT  2015   HERNIA REPAIR      REMOVAL OF IMPLANT  03/14/2020   TEE WITHOUT CARDIOVERSION N/A 05/13/2020   Procedure: TRANSESOPHAGEAL ECHOCARDIOGRAM (TEE);  Surgeon: Lelon Perla, MD;  Location: [facility] ENDOSCOPY;  Service: Cardiovascular;  Laterality: N/A;    Allergies  Allergies  Allergen Reactions   Chocolate Rash    9.9.2022 Daughter reports that the patient regularly eats chocolate without any issues.   Peanut-Containing Drug Products Rash    History of Present Illness    [name] has a PMH of acute on chronic CHF, aortic valve stenosis, HTN, TIA, mitral valve regurgitation, chronic A. fib, and HLD. Aortic stenosis and CAD s/p CABG + pericardial AVR + LAA clipping in 2015   He was seen in emergency department 08/01/2020 for abdominal pain and shortness of breath.  This was his ninth ED visit in 6 months.  He was treated earlier in the day and discharged home.  It was felt that his abdominal pain was chronic and it was not clear whether he was taking this medication or not.  It was also noted that he had been hospitalized recently in [place] and [place] and had an episode of bacteremia, and dyspnea.  He admitted to taking his Lasix.  Patient's daughter was contacted about his chronic pain condition.  Patient has not yet followed up with pain specialist.  He was treated with 1 dose of pain medication and instructed to follow-up with pain management.   He was seen in the hospital August 2021 after a bout of septic shock,  shortness of breath and multiple comorbidities.  His mitral valve regurgitation was found to be moderate to severe by echo with a possibly ruptured chordae and normal LV function.  He was felt to be too high of a risk for mitraclip due to his restricted leaflets.  His medical management was continued.  Blood pressure was well controlled.  A. fib well controlled.   He presented to the clinic 08/03/2020 for follow-up evaluation stated he felt well.  He presented with his daughter who stated  that she had been helping with his medication and monitoring his weight.  She stated that he had been taking his furosemide daily due to fluid accumulation.  He was also living with her at this time due to his health needs and she was relocating him from [place].  He had been weighing himself daily and his dry weight was between 166 and 168.  He stated that when he would get up to 175 his breathing became very short and he noticed difficulty breathing and in recumbent positions.  We  talked about the importance of restricting salt in the diet, fluid restriction, and compliance with medications.  He stated that he did not sleep well unless he was at the hospital and receiving pain medication.  I instructed him to follow-up with his PCP to request sleep aid.  I  gave him the salty 6 diet sheet, had him weigh himself daily and contact the office with a weight increase of 3 pounds overnight or 5 pounds in 1 week.  We will order a BMP  and planned  follow-up in 3 months.   He was admitted to the hospital with CHF exacerbation 10/24 until 08/13/2020.  On admission his weight was 164.5 pounds and at discharge his dry weight was 143 pounds.  During his follow-up appointment with Dr. Sallyanne Welch his weight was 170 pounds.  He indicated that his home scale was reading 160 pounds.  His daughter had been keeping track of his weights and upon review showed 88 gained around 9 pounds.  He was noted to have significant lower extremity edema.  He denied orthopnea and PND.  His blood pressure was 147/119 when he arrived to the hospital and with his follow-up visit was 150/92.  He denied chest pain.  He denied dizziness and syncope.  His baseline BNP was around 500 and with his most recent hospitalization was 1200.  His proBNP 08/24/20 was 370 with a documented weight of 156 pounds.  His EKG showed atrial fibrillation that was rate controlled.  He was cardiac unaware.   It was recommended that his diuretic medication be  increased.  However he was reluctant because he felt he was doing fine.  He minimized the presence of lower extremity edema.   He underwent right and left heart catheterization as well as TEE.  He was noted to have thickening of the mitral leaflets which appeared restricted.  He is not a candidate for surgical mitral valve repair or MitraClip.   He presented to the clinic 10/01/2020 for follow-up evaluation stated he felt well.  He presented with his daughter.  He had been receiving visits from advanced home health and doing physical therapy 2 times per week.  He was walking on his own on off days.  His weight was down 3 pounds.  He reported compliance with daily weights, blood pressure medication, and blood pressure measurements.  Stated overall he felt well.  He had been using "no salt".  I advised against  this and recommended using pepper and different types of seasonings to add taste/flavor to food.  I  gave him salty 6 diet sheet and planned follow-up in 3 months.  He presented to the clinic 12/07/20  for follow-up evaluation and states he feels well.  He was recently at his PCP who diagnosed him with pneumonia.  He has 2 more days of his antibiotics until he has completed his course.  He reports that in the past he has been diagnosed with pneumonia but felt it was diagnosed incorrectly.  He presented with his daughter and we reviewed pneumonia versus CHF exacerbation and chest x-rays.  They expressed understanding.  His weight has remained stable and he is tolerating his medications well.  He is no longer receiving home health and will start at a Medicare supportive gym this week.  He plans to attend 2 days/week.  Labs stable.  We will continue his low-salt diet, maintain physical activity, follow-up with Dr. Sallyanne Welch in 6 months.   He presented to the emergency department on 04/27/2022 with complaints of right upper extremity and right leg pain.  He denied recent falls.  He reported chronic weakness in  the right upper and lower extremities.  Was unchanged.  His condition was felt to be part of chronic disease process.  He received p.o. pain medication and was discharged in stable condition.  He presents to the clinic today for follow-up patient states he is doing okay.  We reviewed his recent visit to the emergency department.  He expressed understanding.  He denies any cardiac complaints.  He reports that he is fairly sedentary.  I recommended chair exercises.  He reports that he is tolerating his medications well.  His blood pressure today is 128/62 and his pulse is 63.  I will plan follow-up in 6 to 9 months and provide chair exercises for him to do 2-3 times per week.  Today he denies chest pain, shortness of breath, lower extremity edema, fatigue, palpitations, melena, hematuria, hemoptysis, diaphoresis, weakness, presyncope, syncope, orthopnea, and PND.  Home Medications    Prior to Admission medications   Medication Sig Start Date End Date Taking? Authorizing Provider  apixaban (ELIQUIS) 5 MG TABS tablet Take 1 tablet (5 mg total) by mouth in the morning and at bedtime. 11/09/20   Croitoru, Mihai, MD  carvedilol (COREG) 6.25 MG tablet Take 1 tablet (6.25 mg total) by mouth 2 (two) times daily with a meal. 05/19/20   Dwyane Dee, MD  doxycycline (VIBRA-TABS) 100 MG tablet Take 1 tablet (100 mg total) by mouth 2 (two) times daily. 11/24/20   Saguier, Percell Miller, PA-C  furosemide (LASIX) 40 MG tablet Take 2 tablets (80 mg total) by mouth daily. 09/02/20 12/31/20  Croitoru, Mihai, MD  Ginkgo Biloba 40 MG TABS Take 1 tablet by mouth daily.    [provider]  HEMP OIL-VANILLYL BUTYL ETHER EX Apply 1 application topically daily.    [provider]  hydrALAZINE (APRESOLINE) 50 MG tablet Take 1 tablet (50 mg total) by mouth every 8 (eight) hours. Patient not taking: No sig reported 09/02/20 12/31/20  Croitoru, Mihai, MD  HYDROcodone-acetaminophen (NORCO) 5-325 MG tablet Take 0.5 tablets  by mouth every 4 (four) hours as needed (for pain). Patient not taking: No sig reported 08/07/20   Molpus, John, MD  ipratropium (ATROVENT) 0.02 % nebulizer solution Take 2.5 mLs (0.5 mg total) by nebulization in the morning, at noon, and at bedtime. 07/28/20   Hunsucker, Bonna Gains, MD  losartan (  COZAAR) 100 MG tablet Take 1 tablet (100 mg total) by mouth daily. 06/09/20   Saguier, [name], PA-C  Multiple Vitamin (MULTIVITAMIN WITH MINERALS) TABS tablet Take 1 tablet by mouth daily.    [provider]  ondansetron (ZOFRAN ODT) 4 MG disintegrating tablet Take 1 tablet (4 mg total) by mouth every 8 (eight) hours as needed for nausea or vomiting. 07/07/20   Saguier, [name], PA-C  OXYGEN Inhale 2-3 sprays into the lungs every 4 (four) hours as needed (for shortness of breath). Patient not taking: No sig reported    [provider]  potassium chloride SA (KLOR-CON) 20 MEQ tablet Take 20 mEq by mouth daily with breakfast.    [provider]  TURMERIC PO Take 1 tablet by mouth daily.    [provider]    Family History    Family History  Problem Relation Age of Onset   CAD Neg Hx    He indicated that his mother is deceased. He indicated that his father is deceased. He indicated that the status of his neg hx is unknown.  Social History    Social History   Socioeconomic History   Marital status: Divorced    Spouse name: Not on file   Number of children: Not on file   Years of education: Not on file   Highest education level: Not on file  Occupational History   Not on file  Tobacco Use   Smoking status: Former    Packs/day: 3.00    Years: 47.00    Total pack years: 141.00    Types: Cigarettes, Cigars    Start date: 1964    Quit date: 07/28/2010    Years since quitting: 11.7   Smokeless tobacco: Never  Vaping Use   Vaping Use: Never used  Substance and Sexual Activity   Alcohol use: No   Drug use: No   Sexual activity: Not on file  Other Topics  Concern   Not on file  Social History Narrative   Not on file   Social Determinants of Health   Financial Resource Strain: Not on file  Food Insecurity: Not on file  Transportation Needs: Not on file  Physical Activity: Not on file  Stress: Not on file  Social Connections: Not on file  Intimate Partner Violence: Not on file     Review of Systems    General:  No chills, fever, night sweats or weight changes.  Cardiovascular:  No chest pain, dyspnea on exertion, edema, orthopnea, palpitations, paroxysmal nocturnal dyspnea. Dermatological: No rash, lesions/masses Respiratory: No cough, dyspnea Urologic: No hematuria, dysuria Abdominal:   No nausea, vomiting, diarrhea, bright red blood per rectum, melena, or hematemesis Neurologic:  No visual changes, wkns, changes in mental status. All other systems reviewed and are otherwise negative except as noted above.  Physical Exam    VS:  BP 128/62   Pulse 63   Ht 6\' 2"  (1.88 m)   SpO2 94%   BMI 22.08 kg/m  , BMI Body mass index is 22.08 kg/m. GEN: Well nourished, well developed, in no acute distress. HEENT: normal. Neck: Supple, no JVD, carotid bruits, or masses. Cardiac: RRR, no murmurs, rubs, or gallops. No clubbing, cyanosis, edema.  Radials/DP/PT 2+ and equal bilaterally.  Respiratory:  Respirations regular and unlabored, clear to auscultation bilaterally. GI: Soft, nontender, nondistended, BS + x 4. MS: no deformity or atrophy. Skin: warm and dry, no rash. Neuro:  Strength and sensation are intact. Psych: Normal affect.  Accessory  Clinical Findings    Recent Labs: 06/22/2021: Pro B Natriuretic peptide (BNP) 324.0 06/27/2021: Magnesium 1.9 04/20/2022: ALT 9; B Natriuretic Peptide 125.4 04/22/2022: BUN 15; Creatinine, Ser 1.09; Hemoglobin 14.4; Platelets 105; Potassium 3.8; Sodium 137   Recent Lipid Panel    Component Value Date/Time   CHOL 143 06/24/2021 0219   TRIG 72 06/24/2021 0219   HDL 37 (L) 06/24/2021 0219    CHOLHDL 3.9 06/24/2021 0219   VLDL 14 06/24/2021 0219   LDLCALC 92 06/24/2021 0219    ECG personally reviewed by me today-none today.  EKG 08/03/2020 atrial fibrillation with slow ventricular response incomplete left bundle branch block ST and T wave abnormality consider inferolateral ischemia  50 bpm   EKG 06/01/2020 Atrial fibrillation PVCs, borderline right axis deviation with nonspecific T wave abnormality, borderline prolonged QT of 479.   Echocardiogram 05/12/2020 IMPRESSIONS     1. Left ventricular ejection fraction, by estimation, is 55 to 60%. The  left ventricle has normal function. The left ventricle has no regional  wall motion abnormalities. There is moderate concentric left ventricular  hypertrophy. Left ventricular  diastolic parameters are indeterminate.   2. Right ventricular systolic function is normal. The right ventricular  size is normal. There is moderately elevated pulmonary artery systolic  pressure.   3. Left atrial size was severely dilated.   4. Right atrial size was severely dilated.   5. The mitral valve is normal in structure. Mild mitral valve  regurgitation. No evidence of mitral stenosis.   6. There is thickening of the septal leaflet. The tricuspid valve is  abnormal.   7. Perivalvular thickening around the bioprosthetic aortic valve. The  valve appears to be stable and there is no rocking motion. In the setting  of bacteremia concern for endocarditis/abscess. TEE recommended.. The  aortic valve has been  repaired/replaced. Aortic valve regurgitation is not visualized. No aortic  stenosis is present. There is a unknown bioprosthetic tilting disk valve  present in the aortic position. Procedure Date: 2015. Echo findings are  consistent with normal structure  and function of the aortic valve prosthesis. Aortic valve area, by VTI  measures 1.39 cm. Aortic valve mean gradient measures 10.0 mmHg. Aortic  valve Vmax measures 2.16 m/s.   8. Aortic  dilatation noted. There is mild dilatation of the ascending  aorta measuring 38 mm.   9. The inferior vena cava is dilated in size with >50% respiratory  variability, suggesting right atrial pressure of 8 mmHg.   Assessment & Plan   1.  Atrial fibrillation-heart rate today 63.  No increased shortness of breath or DOE.  Denies bleeding issues and reports compliance with apixaban. Continue  apixaban Heart healthy low-sodium diet Increase physical activity as tolerated  Chronic diastolic heart failure/mitral regurgitation-  Euvolemic today.   Hospitalization 04/27/2022 weight on admission 172 pounds.   Echocardiogram 06/23/2021 showed an LVEF of 60-65%, moderate left ventricular hypertrophy, and intermediate diastolic parameters.   Previously felt not be a candidate for MitraClip due to his restricted leaflets.  Medical management was recommended. Continue furosemide, potassium, losartan Heart healthy low-sodium diet-salty 6 reviewed Increase physical activity as tolerated Lower extremity support stockings-continue Contact office with a weight increase of 3 pounds overnight or 5 pounds in 1 week Continue fluid restriction 48-64 ounces Start chair exercises 2-3 times per week.   Essential hypertension-BP today 128/62.  Well-controlled at home.   Continue  losartan, hydralazine Heart healthy low-sodium diet-salty 6 given-reviewed Increase physical activity as tolerated  Disposition: Follow-up with Dr. Royann Shivers in 6-9 months.   Thomasene Ripple. Timiya Howells NP-C    05/01/2022, 8:43 AM [facility] [address] Office [phone] Fax [phone]  Notice: This dictation was prepared with Dragon dictation along with smaller phrase technology. Any transcriptional errors that result from this process are unintentional and may not be corrected upon review.  I spent 12 minutes examining this patient, reviewing medications, and using patient centered shared  decision making involving her cardiac care.  Prior to her visit I spent greater than 20 minutes reviewing her past medical history,  medications, and prior cardiac tests.

## 2022-05-01 ENCOUNTER — Encounter: Payer: Self-pay | Admitting: General Practice

## 2022-05-01 ENCOUNTER — Ambulatory Visit (INDEPENDENT_AMBULATORY_CARE_PROVIDER_SITE_OTHER): Payer: Medicare Other | Admitting: General Practice

## 2022-05-01 VITALS — BP 128/62 | HR 63 | Ht 74.0 in

## 2022-05-01 DIAGNOSIS — I4819 Other persistent atrial fibrillation: Secondary | ICD-10-CM | POA: Diagnosis not present

## 2022-05-01 DIAGNOSIS — I5032 Chronic diastolic (congestive) heart failure: Secondary | ICD-10-CM

## 2022-05-01 DIAGNOSIS — I10 Essential (primary) hypertension: Secondary | ICD-10-CM

## 2022-05-01 NOTE — Patient Instructions (Signed)
Medication Instructions:  The current medical regimen is effective;  continue present plan and medications as directed. Please refer to the Current Medication list given to you today.   *If you need a refill on your cardiac medications before your next appointment, please call your pharmacy*  Lab Work:   Testing/Procedures:  NONE    NONE If you have labs (blood work) drawn today and your tests are completely normal, you will receive your results only by: [place] (if you have MyChart) OR  A paper copy in the mail If you have any lab test that is abnormal or we need to change your treatment, we will call you to review the results.  Special Instructions PLEASE DO CHAIR EXERCISES-ATTACHED  Follow-Up: Your next appointment:  6-9 month(s) In Person with Sanda Klein, MD    Please call our office 2 months in advance to schedule this appointment   At [facility], you and your health needs are our priority.  As part of our continuing mission to provide you with exceptional heart care, we have created designated Provider Care Teams.  These Care Teams include your primary Cardiologist (physician) and Advanced Practice Providers (APPs -  Physician Assistants and Nurse Practitioners) who all work together to provide you with the care you need, when you need it.  Important Information About Sugar     Exercises to do While Sitting  Exercises that you do while sitting (chair exercises) can give you many of the same benefits as full exercise. Benefits include strengthening your heart, burning calories, and keeping muscles and joints healthy. Exercise can also improve your mood and help with depression and anxiety. You may benefit from chair exercises if you are unable to do standing exercises due to: Diabetic foot pain. Obesity. Illness. Arthritis. Recovery from surgery or injury. Breathing problems. Balance problems. Another type of disability. Before starting chair exercises,  check with your health care provider or a physical therapist to find out how much exercise you can tolerate and which exercises are safe for you. If your health care provider approves: Start out slowly and build up over time. Aim to work up to about 10-20 minutes for each exercise session. Make exercise part of your daily routine. Drink water when you exercise. Do not wait until you are thirsty. Drink every 10-15 minutes. Stop exercising right away if you have pain, nausea, shortness of breath, or dizziness. If you are exercising in a wheelchair, make sure to lock the wheels. Ask your health care provider whether you can do tai chi or yoga. Many positions in these mind-body exercises can be modified to do while seated. Warm-up Before starting other exercises: Sit up as straight as you can. Have your knees bent at 90 degrees, which is the shape of the capital letter "L." Keep your feet flat on the floor. Sit at the front edge of your chair, if you can. Pull in (tighten) the muscles in your abdomen and stretch your spine and neck as straight as you can. Hold this position for a few minutes. Breathe in and out evenly. Try to concentrate on your breathing, and relax your mind. Stretching Exercise A: Arm stretch Hold your arms out straight in front of your body. Bend your hands at the wrist with your fingers pointing up, as if signaling someone to stop. Notice the slight tension in your forearms as you hold the position. Keeping your arms out and your hands bent, rotate your hands outward as far as you can  and hold this stretch. Aim to have your thumbs pointing up and your pinkie fingers pointing down. Slowly repeat arm stretches for one minute as tolerated. Exercise B: Leg stretch If you can move your legs, try to "draw" letters on the floor with the toes of your foot. Write your name with one foot. Write your name with the toes of your other foot. Slowly repeat the movements for one minute as  tolerated. Exercise C: Reach for the sky Reach your hands as far over your head as you can to stretch your spine. Move your hands and arms as if you are climbing a rope. Slowly repeat the movements for one minute as tolerated. Range of motion exercises Exercise A: Shoulder roll Let your arms hang loosely at your sides. Lift just your shoulders up toward your ears, then let them relax back down. When your shoulders feel loose, rotate your shoulders in backward and forward circles. Do shoulder rolls slowly for one minute as tolerated. Exercise B: March in place As if you are marching, pump your arms and lift your legs up and down. Lift your knees as high as you can. If you are unable to lift your knees, just pump your arms and move your ankles and feet up and down. March in place for one minute as tolerated. Exercise C: Seated jumping jacks Let your arms hang down straight. Keeping your arms straight, lift them up over your head. Aim to point your fingers to the ceiling. While you lift your arms, straighten your legs and slide your heels along the floor to your sides, as wide as you can. As you bring your arms back down to your sides, slide your legs back together. If you are unable to use your legs, just move your arms. Slowly repeat seated jumping jacks for one minute as tolerated. Strengthening exercises Exercise A: Shoulder squeeze Hold your arms straight out from your body to your sides, with your elbows bent and your fists pointed at the ceiling. Keeping your arms in the bent position, move them forward so your elbows and forearms meet in front of your face. Open your arms back out as wide as you can with your elbows still bent, until you feel your shoulder blades squeezing together. Hold for 5 seconds. Slowly repeat the movements forward and backward for one minute as tolerated. Contact a health care provider if: You have to stop exercising due to any of the  following: Pain. Nausea. Shortness of breath. Dizziness. Fatigue. You have significant pain or soreness after exercising. Get help right away if: You have chest pain. You have difficulty breathing. These symptoms may represent a serious problem that is an emergency. Do not wait to see if the symptoms will go away. Get medical help right away. Call your local emergency services (911 in the [place] Do not drive yourself to the hospital. Summary Exercises that you do while sitting (chair exercises) can strengthen your heart, burn calories, and keep muscles and joints healthy. You may benefit from chair exercises if you are unable to do standing exercises due to diabetic foot pain, obesity, recovery from surgery or injury, or other conditions. Before starting chair exercises, check with your health care provider or a physical therapist to find out how much exercise you can tolerate and which exercises are safe for you. This information is not intended to replace advice given to you by your health care provider. Make sure you discuss any questions you have with your health care provider.  Document Revised: 11/28/2020 Document Reviewed: 11/28/2020 Elsevier Patient Education  [place].

## 2022-05-09 ENCOUNTER — Non-Acute Institutional Stay: Payer: Medicare Other | Admitting: Hospice

## 2022-05-09 DIAGNOSIS — Z515 Encounter for palliative care: Secondary | ICD-10-CM

## 2022-05-09 DIAGNOSIS — K5901 Slow transit constipation: Secondary | ICD-10-CM

## 2022-05-09 DIAGNOSIS — I69151 Hemiplegia and hemiparesis following nontraumatic intracerebral hemorrhage affecting right dominant side: Secondary | ICD-10-CM

## 2022-05-09 DIAGNOSIS — I5042 Chronic combined systolic (congestive) and diastolic (congestive) heart failure: Secondary | ICD-10-CM

## 2022-05-09 DIAGNOSIS — R52 Pain, unspecified: Secondary | ICD-10-CM

## 2022-05-09 NOTE — Progress Notes (Signed)
Therapist, nutritional Palliative Care Consult Note Telephone: [phone]  Fax: [phone]  PATIENT NAME: Carlos Welch DOB: [birth year] MRN: [id number]  PRIMARY CARE PROVIDER:   Marisue Brooklyn SaguierKateri Mc [address] [place],  [place] [address]  REFERRING PROVIDER: Marisue Brooklyn SaguierRamon Dredge, PA-C [address] [place],  [place] [address]  RESPONSIBLE PARTY:  Self Contact Information     Name Relation Home Work Mobile   Hearn,Heather Daughter   [phone]      Palliative Care was asked to follow this patient to address advance care planning, complex medical decision making and goals of care clarification.  Visit is to build trust and highlight Palliative Medicine as specialized medical care for people living with serious illness, aimed at facilitating better quality of life through symptoms relief, assisting with advance care planning and complex medical decision making. This is a follow up visit.  RECOMMENDATIONS/PLAN:   Advance Care Planning/Code Status: Patient is a full code  Goals of Care: Goals of care include to maximize quality of life and symptom management. Palliative care team will continue to support patient, patient's family, and medical team.  Symptom management/Plan:  Pain to Right side : related to R hemiplegia/hemiparesis. Continue Tramadol as ordered. Encourage physical activity as tolerated to optimize wellbeing.  Hemiplegia/hemiparesis: R side.Continue short distance ambulation with rolling walker; gets around mostly on wheelchair.  Fall precautions. Participate in facility restorative exercises. Balance of rest and performance activity.  CHF: Stable. Continue Lasix as ordered. Elevate BLE during the day as much as possible to promote circulation. Adhere to Fluid and salt limits. Monitor weight closely and report weight gain of 3 Ibs in a day or 5 Ibs in a week.  Constipation:  Stable. Managed with [place]ot S.  Follow up: Palliative care will continue to follow for complex medical decision making, advance care planning, and clarification of goals. Return 6 weeks or prn. Encouraged to call provider sooner with any concerns.  CHIEF COMPLAINT: Palliative follow up visit HISTORY OF PRESENT ILLNESS:  [name] a 76 y.o. 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 History obtained from review of EMR, discussion with primary team, family and/or patient. Records reviewed and summarized above. All 10 point systems reviewed and are negative except as documented in history of present illness above  Review and summarization of Epic records shows history from other than patient.   Palliative Care was asked to follow this patient o help address complex decision making in the context of advance care planning and goals of care clarification. I reviewed, as needed, available labs, patient records, imaging, studies and related documents from the EMR.    PERTINENT MEDICATIONS:  Outpatient Encounter Medications as of 05/09/2022  Medication Sig   acetaminophen (TYLENOL) 325 MG tablet Take 2 tablets (650 mg total) by mouth every 4 (four) hours as needed for mild pain (or temp > 37.5 C (99.5 F)).   atorvastatin (LIPITOR) 10 MG tablet Take 10 mg by mouth daily.   diclofenac Sodium (VOLTAREN) 1 % GEL Apply 1 Application topically 3 (three) times daily. Apply to right knee topically three times a day for right knee pain.   famotidine (PEPCID) 10 MG tablet Take 10 mg by mouth daily.   furosemide (LASIX) 20 MG tablet Take 1 tablet (20 mg total) by mouth  daily.   gabapentin (NEURONTIN) 100 MG capsule Take 1 capsule (100 mg total) by mouth 3 (three) times daily. (Patient taking differently: Take 200 mg by mouth 3 (three) times  daily.)   hydrALAZINE (APRESOLINE) 25 MG tablet Take 1 tablet (25 mg total) by mouth every 8 (eight) hours.   losartan (COZAAR) 50 MG tablet Take 1 tablet (50 mg total) by mouth daily.   pantoprazole (PROTONIX) 40 MG tablet Take 1 tablet (40 mg total) by mouth daily.   senna-docusate (SENOKOT-S) 8.6-50 MG tablet Take 1 tablet by mouth at bedtime.   tamsulosin (FLOMAX) 0.4 MG CAPS capsule Take 1 capsule (0.4 mg total) by mouth daily after supper.   traMADol (ULTRAM) 50 MG tablet Take 1 tablet (50 mg total) by mouth every 6 (six) hours as needed for severe pain or moderate pain.   No facility-administered encounter medications on file as of 05/09/2022.    HOSPICE ELIGIBILITY/DIAGNOSIS: TBD  PAST MEDICAL HISTORY:  Past Medical History:  Diagnosis Date   A-fib (HCC)    Abscess of jaw 07/19/2020   Acute on chronic heart failure with preserved ejection fraction (HCC) 07/08/2020   Aortic stenosis    a. s/p pericardial AVR 2015.   Aortic valve stenosis 07/19/2020   Bacteremia 07/19/2020   Bacterial endocarditis    BPH (benign prostatic hyperplasia) 07/08/2020   CAD (coronary artery disease)    a. s/p CABG, AVR, [facility] clipping [facility].   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/08/2020   Elevated troponin 07/08/2020   Essential hypertension 07/08/2020   Generalized abdominal pain    History of noncompliance with medical treatment    Hyperlipemia 07/08/2020   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   Malnutrition (HCC)    MRSA bacteremia    Nonrheumatic mitral valve regurgitation 07/19/2020   Normocytic anemia  07/08/2020   Pressure injury of skin 05/12/2020   S/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 2015   Severe malnutrition (HCC) 07/19/2020   Thrombocytopenia (HCC) 07/08/2020   TIA (transient ischemic attack)    Weight loss 07/19/2020      ALLERGIES:  Allergies  Allergen Reactions   Chocolate Rash    9.9.2022 Daughter reports that the patient regularly eats chocolate without any issues.   Peanut-Containing Drug Products Rash      I spent 45 minutes providing this consultation; this includes time spent with patient/family, chart review and documentation. More than 50% of the time in this consultation was spent on counseling and coordinating communication   Thank you for the opportunity to participate in the care of Avraj L Kanitz Please call our office at [phone] if we can be of additional assistance.  Note: Portions of this note were generated with Scientist, clinical (histocompatibility and immunogenetics). Dictation errors may occur despite best attempts at proofreading.  Rosaura Carpenter, NP

## 2022-08-31 ENCOUNTER — Telehealth: Payer: Self-pay | Admitting: Neurology

## 2022-08-31 NOTE — Telephone Encounter (Signed)
Called pt, no VM box, sent mychart msg informing pt of need to reschedule 11/22 appointment - MD out

## 2022-09-06 ENCOUNTER — Ambulatory Visit: Payer: Medicare Other | Admitting: Neurology

## 2022-09-06 ENCOUNTER — Non-Acute Institutional Stay: Payer: Medicare Other | Admitting: Hospice

## 2022-09-06 DIAGNOSIS — K5901 Slow transit constipation: Secondary | ICD-10-CM

## 2022-09-06 DIAGNOSIS — I5042 Chronic combined systolic (congestive) and diastolic (congestive) heart failure: Secondary | ICD-10-CM

## 2022-09-06 DIAGNOSIS — Z515 Encounter for palliative care: Secondary | ICD-10-CM

## 2022-09-06 DIAGNOSIS — I69151 Hemiplegia and hemiparesis following nontraumatic intracerebral hemorrhage affecting right dominant side: Secondary | ICD-10-CM

## 2022-09-06 DIAGNOSIS — R52 Pain, unspecified: Secondary | ICD-10-CM

## 2022-09-06 NOTE — Progress Notes (Signed)
Therapist, nutritional Palliative Care Consult Note Telephone: [phone]  Fax: [phone]  PATIENT NAME: Carlos Welch DOB: 02-08-1946 MRN: [id number]  PRIMARY CARE PROVIDER:   Dr. French Ana Goen/[facility] NP  REFERRING PROVIDER: Marisue Brooklyn SaguierRamon Dredge, PA-C [address] [place],  [place] [address]  RESPONSIBLE PARTY:  Self Contact Information     Name Relation Home Work Mobile   Hearn,Heather Daughter   [phone]      Palliative Care was asked to follow this patient to address advance care planning, complex medical decision making and goals of care clarification.  Visit is to build trust and highlight Palliative Medicine as specialized medical care for people living with serious illness, aimed at facilitating better quality of life through symptoms relief, assisting with advance care planning and complex medical decision making. This is a follow up visit.  RECOMMENDATIONS/PLAN:   Advance Care Planning/Code Status: Patient is a full code  Goals of Care: Goals of care include to maximize quality of life and symptom management. Palliative care team will continue to support patient, patient's family, and medical team.  Symptom management/Plan:  Hemiplegia/hemiparesis: R side.Continue facility restorative exercises.  Continue short distance ambulation with rolling walker; gets around mostly on wheelchair.  Fall precautions. Balance of rest and performance activity.  Pain to Right side : related to R hemiplegia/hemiparesis. Continue Tramadol as ordered. Encourage physical activity as tolerated to optimize wellbeing.  Depression: Psych following. Continue Zoloft as ordered.  Encourage socialization. CHF: Continue Lasix as ordered. Elevate BLE during the day as much as possible to promote circulation. Adhere to Fluid and salt limits. Monitor weight closely and report weight gain of 3 Ibs in a day or 5 Ibs in a week.  Routine CBC  BMP Constipation: Stable. Managed with Senekot S.  Follow up: Palliative care will continue to follow for complex medical decision making, advance care planning, and clarification of goals. Return 6 weeks or prn. Encouraged to call provider sooner with any concerns.  CHIEF COMPLAINT: Palliative follow up visit HISTORY OF PRESENT ILLNESS:  Athel L Kiesel a 76 y.o. 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 History obtained from review of EMR, discussion with primary team -[facility] NP and/or patient. Records reviewed and summarized above. All 10 point systems reviewed and are negative except as documented in history of present illness above  Review and summarization of Epic records shows history from other than patient.   Palliative Care was asked to follow this patient o help address complex decision making in the context of advance care planning and goals of care clarification. I reviewed, as needed, available labs, patient records, imaging, studies and related documents from the EMR.    PERTINENT MEDICATIONS:  Outpatient Encounter Medications as of 09/06/2022  Medication Sig   acetaminophen (TYLENOL) 325 MG tablet Take 2 tablets (650 mg total) by mouth every 4 (four) hours as needed for mild pain (or temp > 37.5 C (99.5 F)).   atorvastatin (LIPITOR) 10 MG tablet Take 10 mg by mouth daily.   diclofenac Sodium (VOLTAREN) 1 % GEL Apply 1 Application topically 3 (three) times daily. Apply to right knee topically three times a day for right knee pain.   famotidine (PEPCID) 10 MG tablet Take 10 mg by mouth daily.   furosemide (LASIX) 20 MG tablet Take 1 tablet (20 mg total) by mouth  daily.   gabapentin (NEURONTIN) 100 MG capsule Take 1 capsule (100 mg total) by mouth 3 (three) times daily. (Patient taking differently: Take 200 mg by mouth  3 (three) times daily.)   hydrALAZINE (APRESOLINE) 25 MG tablet Take 1 tablet (25 mg total) by mouth every 8 (eight) hours.   losartan (COZAAR) 50 MG tablet Take 1 tablet (50 mg total) by mouth daily.   pantoprazole (PROTONIX) 40 MG tablet Take 1 tablet (40 mg total) by mouth daily.   senna-docusate (SENOKOT-S) 8.6-50 MG tablet Take 1 tablet by mouth at bedtime.   tamsulosin (FLOMAX) 0.4 MG CAPS capsule Take 1 capsule (0.4 mg total) by mouth daily after supper.   traMADol (ULTRAM) 50 MG tablet Take 1 tablet (50 mg total) by mouth every 6 (six) hours as needed for severe pain or moderate pain.   No facility-administered encounter medications on file as of 09/06/2022.    HOSPICE ELIGIBILITY/DIAGNOSIS: TBD  PAST MEDICAL HISTORY:  Past Medical History:  Diagnosis Date   A-fib ([place])    Abscess of jaw 07/19/2020   Acute on chronic heart failure with preserved ejection fraction ([place]) 07/08/2020   Aortic stenosis    a. s/p pericardial AVR 2015.   Aortic valve stenosis 07/19/2020   Bacteremia 07/19/2020   Bacterial endocarditis    BPH (benign prostatic hyperplasia) 07/08/2020   CAD (coronary artery disease)    a. s/p CABG, AVR, LAA clipping 2015 at [facility].   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/08/2020   Elevated troponin 07/08/2020   Essential hypertension 07/08/2020   Generalized abdominal pain    History of noncompliance with medical treatment    Hyperlipemia 07/08/2020   Infection associated with implant ([place]) 07/19/2020   Malnutrition ([place])    MRSA bacteremia    Nonrheumatic mitral valve regurgitation 07/19/2020    Normocytic anemia 07/08/2020   Pressure injury of skin 05/12/2020   S/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 2015   Severe malnutrition ([place]) 07/19/2020   Thrombocytopenia ([place]) 07/08/2020   TIA (transient ischemic attack)    Weight loss 07/19/2020      ALLERGIES:  Allergies  Allergen Reactions   Chocolate Rash    9.9.2022 Daughter reports that the patient regularly eats chocolate without any issues.   Peanut-Containing Drug Products Rash      I spent 45 minutes providing this consultation; this includes time spent with patient/family, chart review and documentation. More than 50% of the time in this consultation was spent on counseling and coordinating communication   Thank you for the opportunity to participate in the care of Hosmer Kube Please call our office at [phone] if we can be of additional assistance.  Note: Portions of this note were generated with Lobbyist. Dictation errors may occur despite best attempts at proofreading.  Teodoro Spray, NP

## 2022-09-26 ENCOUNTER — Non-Acute Institutional Stay: Payer: Medicare Other | Admitting: Hospice

## 2022-09-26 DIAGNOSIS — Z515 Encounter for palliative care: Secondary | ICD-10-CM

## 2022-09-26 DIAGNOSIS — I69151 Hemiplegia and hemiparesis following nontraumatic intracerebral hemorrhage affecting right dominant side: Secondary | ICD-10-CM

## 2022-09-26 DIAGNOSIS — R52 Pain, unspecified: Secondary | ICD-10-CM

## 2022-09-26 DIAGNOSIS — K5901 Slow transit constipation: Secondary | ICD-10-CM

## 2022-09-26 DIAGNOSIS — F339 Major depressive disorder, recurrent, unspecified: Secondary | ICD-10-CM

## 2022-09-26 NOTE — Progress Notes (Signed)
Therapist, nutritional Palliative Care Consult Note Telephone: [phone]  Fax: [phone]  PATIENT NAME: Carlos Welch DOB: [birth year] MRN: [id number]  PRIMARY CARE PROVIDER:   Dr. French Ana Goen/[facility] NP  REFERRING PROVIDER: Marisue Brooklyn SaguierRamon Dredge, PA-C [address] [place],  [place] [address]  RESPONSIBLE PARTY:  Self Contact Information     Name Relation Home Work Mobile   Hearn,Heather Daughter   [phone]      Palliative Care was asked to follow this patient to address advance care planning, complex medical decision making and goals of care clarification.  Visit is to build trust and highlight Palliative Medicine as specialized medical care for people living with serious illness, aimed at facilitating better quality of life through symptoms relief, assisting with advance care planning and complex medical decision making. This is a follow up visit.  RECOMMENDATIONS/PLAN:   Advance Care Planning/Code Status: Patient is a full code  Goals of Care: Goals of care include to maximize quality of life and symptom management. Palliative care team will continue to support patient, patient's family, and medical team.  Symptom management/Plan:  Hemiplegia/hemiparesis: R side.encourage physical activity as tolerated to optimize wellbeing.  Continue facility restorative exercises.  Continue short distance ambulation with rolling walker; gets around mostly on wheelchair.  Fall precautions.  Pain to Right side : related to R hemiplegia/hemiparesis.  Well-controlled.  Continue gabapentin as ordered.  Tylenol 500 mg 3 times daily as needed pain. Depression: Recurrent.  Psych following. Continue Zoloft as ordered.  Encourage socialization. Routine CBC BMP Constipation: Stable. Managed with [place] S.  Follow up: Palliative care will continue to follow for complex medical decision making, advance care planning, and clarification of goals.  Return 6 weeks or prn. Encouraged to call provider sooner with any concerns.  CHIEF COMPLAINT: Palliative follow up visit HISTORY OF PRESENT ILLNESS:  Carlos Welch a 76 y.o. 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 History obtained from review of EMR, discussion with primary team -[facility] NP and/or patient. Records reviewed and summarized above. All 10 point systems reviewed and are negative except as documented in history of present illness above  Review and summarization of Epic records shows history from other than patient.   Palliative Care was asked to follow this patient o help address complex decision making in the context of advance care planning and goals of care clarification. I reviewed, as needed, available labs, patient records, imaging, studies and related documents from the EMR.    PERTINENT MEDICATIONS:  Outpatient Encounter Medications as of 09/26/2022  Medication Sig   acetaminophen (TYLENOL) 325 MG tablet Take 2 tablets (650 mg total) by mouth every 4 (four) hours as needed for mild pain (or temp > 37.5 C (99.5 F)).   atorvastatin (LIPITOR) 10 MG tablet Take 10 mg by mouth daily.   diclofenac Sodium (VOLTAREN) 1 % GEL Apply 1 Application topically 3 (three) times daily. Apply to right knee topically three times a day for right knee pain.   famotidine (PEPCID) 10 MG tablet Take 10 mg by mouth daily.   furosemide (LASIX) 20 MG tablet Take 1 tablet (20 mg total) by mouth daily.   gabapentin (NEURONTIN) 100 MG capsule Take 1 capsule (100 mg total) by mouth 3 (three) times daily. (Patient taking differently: Take 200 mg by mouth 3 (three) times daily.)  hydrALAZINE (APRESOLINE) 25 MG tablet Take 1 tablet (25 mg total) by mouth every 8 (eight) hours.   losartan (COZAAR) 50 MG tablet Take 1 tablet (50 mg total) by  mouth daily.   pantoprazole (PROTONIX) 40 MG tablet Take 1 tablet (40 mg total) by mouth daily.   senna-docusate (SENOKOT-S) 8.6-50 MG tablet Take 1 tablet by mouth at bedtime.   tamsulosin (FLOMAX) 0.4 MG CAPS capsule Take 1 capsule (0.4 mg total) by mouth daily after supper.   traMADol (ULTRAM) 50 MG tablet Take 1 tablet (50 mg total) by mouth every 6 (six) hours as needed for severe pain or moderate pain.   No facility-administered encounter medications on file as of 09/26/2022.    HOSPICE ELIGIBILITY/DIAGNOSIS: TBD  PAST MEDICAL HISTORY:  Past Medical History:  Diagnosis Date   A-fib ([place])    Abscess of jaw 07/19/2020   Acute on chronic heart failure with preserved ejection fraction ([place]) 07/08/2020   Aortic stenosis    a. s/p pericardial AVR 2015.   Aortic valve stenosis 07/19/2020   Bacteremia 07/19/2020   Bacterial endocarditis    BPH (benign prostatic hyperplasia) 07/08/2020   CAD (coronary artery disease)    a. s/p CABG, AVR, LAA clipping 2015 at [facility].   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/08/2020   Elevated troponin 07/08/2020   Essential hypertension 07/08/2020   Generalized abdominal pain    History of noncompliance with medical treatment    Hyperlipemia 07/08/2020   Infection associated with implant ([place]) 07/19/2020   Malnutrition ([place])    MRSA bacteremia    Nonrheumatic mitral valve regurgitation 07/19/2020   Normocytic anemia 07/08/2020   Pressure injury of skin 05/12/2020   S/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 2015   Severe malnutrition ([place]) 07/19/2020   Thrombocytopenia ([place])  07/08/2020   TIA (transient ischemic attack)    Weight loss 07/19/2020      ALLERGIES:  Allergies  Allergen Reactions   Chocolate Rash    9.9.2022 Daughter reports that the patient regularly eats chocolate without any issues.   Peanut-Containing Drug Products Rash      I spent 45 minutes providing this consultation; this includes time spent with patient/family, chart review and documentation. More than 50% of the time in this consultation was spent on counseling and coordinating communication   Thank you for the opportunity to participate in the care of [place] Please call our office at [phone] if we can be of additional assistance.  Note: Portions of this note were generated with Lobbyist. Dictation errors may occur despite best attempts at proofreading.  Teodoro Spray, NP

## 2022-10-17 ENCOUNTER — Non-Acute Institutional Stay: Payer: 59 | Admitting: Hospice

## 2022-10-17 DIAGNOSIS — F339 Major depressive disorder, recurrent, unspecified: Secondary | ICD-10-CM

## 2022-10-17 DIAGNOSIS — I69391 Dysphagia following cerebral infarction: Secondary | ICD-10-CM

## 2022-10-17 DIAGNOSIS — K5901 Slow transit constipation: Secondary | ICD-10-CM

## 2022-10-17 DIAGNOSIS — Z515 Encounter for palliative care: Secondary | ICD-10-CM

## 2022-10-17 DIAGNOSIS — I69151 Hemiplegia and hemiparesis following nontraumatic intracerebral hemorrhage affecting right dominant side: Secondary | ICD-10-CM

## 2022-10-17 NOTE — Progress Notes (Signed)
Designer, jewellery Palliative Care Consult Note Telephone: [phone]  Fax: [phone]  PATIENT NAME: Carlos Welch DOB: Jun 27, 1946 MRN: [id number]  PRIMARY CARE PROVIDER:   Dr. Olivia Mackie Goen/[facility] NP  REFERRING PROVIDER: Elise Benne KnollwoodPercell Miller, PA-C [address] [place],  [place] [address]  RESPONSIBLE PARTY:  Self Contact Information     Name Relation Home Work Mobile   Hearn,Heather Daughter   [phone]      Palliative Care was asked to follow this patient to address advance care planning, complex medical decision making and goals of care clarification.  Visit is to build trust and highlight Palliative Medicine as specialized medical care for people living with serious illness, aimed at facilitating better quality of life through symptoms relief, assisting with advance care planning and complex medical decision making. This is a follow up visit.  RECOMMENDATIONS/PLAN:   Advance Care Planning/Code Status: Patient is a full code  Goals of Care: Goals of care include to maximize quality of life and symptom management. Palliative care team will continue to support patient, patient's family, and medical team.  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 Encourage physical activity as tolerated to optimize wellbeing.  Continue facility restorative exercises.  Continue short distance ambulation with rolling walker; gets around mostly on wheelchair.  Fall precautions.   Dysphagia: Continue mechanical soft diet. Aspiration precautions.   Pain to Right side: related to R hemiplegia/hemiparesis.  Continue gabapentin as ordered.  Tylenol 500 mg 3 times daily as needed pain.  Depression: Recurrent. Patient reports no sadness or moodiness, eats in the dining and participates in some facility activities. Psych following. Continue Zoloft as ordered.  Encourage socialization. Routine CBC  BMP.  Constipation: Stable. Managed with [place] S.   Follow up: Palliative care will continue to follow for complex medical decision making, advance care planning, and clarification of goals. Return 6 weeks or prn. Encouraged to call provider sooner with any concerns.  CHIEF COMPLAINT: Palliative follow up visit  HISTORY OF PRESENT ILLNESS:  Carlos Welch a 77 y.o. 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 History obtained from review of EMR, discussion with primary team -[facility] NP and/or patient. Records reviewed and summarized above. All 10 point systems reviewed and are negative except as documented in history of present illness above  Review and summarization of Epic records shows history from other than patient.   Palliative Care was asked to follow this patient o help address complex decision making in the context of advance care planning and goals of care clarification. I reviewed, as needed, available labs, patient records, imaging, studies and related documents from the EMR.    PERTINENT MEDICATIONS:  Outpatient Encounter Medications as of 10/17/2022  Medication Sig   acetaminophen (TYLENOL) 325 MG tablet Take 2 tablets (650 mg total) by mouth every 4 (four) hours as needed for mild pain (or temp > 37.5 C (99.5 F)).   atorvastatin (LIPITOR) 10 MG tablet Take 10 mg by mouth daily.   diclofenac Sodium (VOLTAREN) 1 % GEL Apply 1 Application topically 3 (three) times daily. Apply to right knee topically three times a day for right knee pain.   famotidine (PEPCID) 10 MG tablet Take 10 mg by mouth daily.   furosemide (LASIX) 20 MG tablet Take 1 tablet (20 mg total) by mouth daily.  gabapentin (NEURONTIN) 100 MG capsule Take 1 capsule (100 mg total) by mouth 3 (three) times daily. (Patient taking differently: Take 200 mg by  mouth 3 (three) times daily.)   hydrALAZINE (APRESOLINE) 25 MG tablet Take 1 tablet (25 mg total) by mouth every 8 (eight) hours.   losartan (COZAAR) 50 MG tablet Take 1 tablet (50 mg total) by mouth daily.   pantoprazole (PROTONIX) 40 MG tablet Take 1 tablet (40 mg total) by mouth daily.   senna-docusate (SENOKOT-S) 8.6-50 MG tablet Take 1 tablet by mouth at bedtime.   tamsulosin (FLOMAX) 0.4 MG CAPS capsule Take 1 capsule (0.4 mg total) by mouth daily after supper.   traMADol (ULTRAM) 50 MG tablet Take 1 tablet (50 mg total) by mouth every 6 (six) hours as needed for severe pain or moderate pain.   No facility-administered encounter medications on file as of 10/17/2022.    HOSPICE ELIGIBILITY/DIAGNOSIS: TBD  PAST MEDICAL HISTORY:  Past Medical History:  Diagnosis Date   A-fib ([place])    Abscess of jaw 07/19/2020   Acute on chronic heart failure with preserved ejection fraction ([place]) 07/08/2020   Aortic stenosis    a. s/p pericardial AVR 2015.   Aortic valve stenosis 07/19/2020   Bacteremia 07/19/2020   Bacterial endocarditis    BPH (benign prostatic hyperplasia) 07/08/2020   CAD (coronary artery disease)    a. s/p CABG, AVR, LAA clipping 2015 at [facility].   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/08/2020   Elevated troponin 07/08/2020   Essential hypertension 07/08/2020   Generalized abdominal pain    History of noncompliance with medical treatment    Hyperlipemia 07/08/2020   Infection associated with implant ([place]) 07/19/2020   Malnutrition ([place])    MRSA bacteremia    Nonrheumatic mitral valve regurgitation 07/19/2020    Normocytic anemia 07/08/2020   Pressure injury of skin 05/12/2020   S/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 2015   Severe malnutrition ([place]) 07/19/2020   Thrombocytopenia ([place]) 07/08/2020   TIA (transient ischemic attack)    Weight loss 07/19/2020      ALLERGIES:  Allergies  Allergen Reactions   Chocolate Rash    9.9.2022 Daughter reports that the patient regularly eats chocolate without any issues.   Peanut-Containing Drug Products Rash      I spent 40 minutes providing this consultation; this includes time spent with patient/family, chart review and documentation. More than 50% of the time in this consultation was spent on counseling and coordinating communication   Thank you for the opportunity to participate in the care of Carlos Welch Please call our office at [phone] if we can be of additional assistance.  Note: Portions of this note were generated with Lobbyist. Dictation errors may occur despite best attempts at proofreading.  Teodoro Spray, NP

## 2022-10-31 ENCOUNTER — Ambulatory Visit: Payer: Medicare Other | Admitting: Neurology

## 2022-10-31 ENCOUNTER — Encounter: Payer: Self-pay | Admitting: Neurology

## 2022-11-16 ENCOUNTER — Non-Acute Institutional Stay: Payer: Medicare Other | Admitting: Hospice

## 2022-11-16 DIAGNOSIS — F419 Anxiety disorder, unspecified: Secondary | ICD-10-CM

## 2022-11-16 DIAGNOSIS — F339 Major depressive disorder, recurrent, unspecified: Secondary | ICD-10-CM

## 2022-11-16 DIAGNOSIS — Z515 Encounter for palliative care: Secondary | ICD-10-CM

## 2022-11-16 DIAGNOSIS — I69151 Hemiplegia and hemiparesis following nontraumatic intracerebral hemorrhage affecting right dominant side: Secondary | ICD-10-CM

## 2022-11-16 DIAGNOSIS — R52 Pain, unspecified: Secondary | ICD-10-CM

## 2022-11-16 DIAGNOSIS — K5901 Slow transit constipation: Secondary | ICD-10-CM

## 2022-11-16 NOTE — Progress Notes (Signed)
Designer, jewellery Palliative Care Consult Note Telephone: [phone]  Fax: [phone]  PATIENT NAME: Carlos Welch DOB: Dec 11, 1945 MRN: [id number]  PRIMARY CARE PROVIDER:   Dr. Olivia Mackie Goen/[facility] NP  REFERRING PROVIDER: Elise Benne [place]one[place]Percell Miller, PA-C [address] [place],  [place] [address]  RESPONSIBLE PARTY:  Self Contact Information     Name Relation Home Work Mobile   Hearn,Heather Daughter   [phone]      Palliative Care was asked to follow this patient to address advance care planning, complex medical decision making and goals of care clarification.  Visit is to build trust and highlight Palliative Medicine as specialized medical care for people living with serious illness, aimed at facilitating better quality of life through symptoms relief, assisting with advance care planning and complex medical decision making. This is a follow up visit.  RECOMMENDATIONS/PLAN:   Advance Care Planning/Code Status: Patient is a full code  Goals of Care: Goals of care include to maximize quality of life and symptom management. Palliative care team will continue to support patient, patient's family, and medical team.  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 Encourage physical activity as tolerated to optimize wellbeing.  Continue facility restorative exercises.  Continue short distance ambulation with rolling walker; gets around mostly on wheelchair.  Fall precautions.   Dysphagia: Continue mechanical soft diet. Aspiration precautions.   Pain to Right side: related to R hemiplegia/hemiparesis.  Continue gabapentin as ordered.   Anxiety: Continue hydroxyzine as ordered.  Depression: Recurrent.  Managed with Zoloft.  Patient reports no sadness or moodiness today, eats in the dining and participates in some facility activities. Psych following. Encourage socialization. Routine CBC  BMP.  Constipation: Stable. Managed with [place] S.   Follow up: Palliative care will continue to follow for complex medical decision making, advance care planning, and clarification of goals. Return 6 weeks or prn. Encouraged to call provider sooner with any concerns.  CHIEF COMPLAINT: Palliative follow up visit  HISTORY OF PRESENT ILLNESS:  Carlos Welch a 77 y.o. 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 History obtained from review of EMR, discussion with primary team and/or patient. Records reviewed and summarized above. All 10 point systems reviewed and are negative except as documented in history of present illness above  Review and summarization of Epic records shows history from other than patient.   Palliative Care was asked to follow this patient o help address complex decision making in the context of advance care planning and goals of care clarification. I reviewed, as needed, available labs, patient records, imaging, studies and related documents from the EMR.    PERTINENT MEDICATIONS:  Outpatient Encounter Medications as of 10/17/2022  Medication Sig   acetaminophen (TYLENOL) 325 MG tablet Take 2 tablets (650 mg total) by mouth every 4 (four) hours as needed for mild pain (or temp > 37.5 C (99.5 F)).   atorvastatin (LIPITOR) 10 MG tablet Take 10 mg by mouth daily.   diclofenac Sodium (VOLTAREN) 1 % GEL Apply 1 Application topically 3 (three) times daily. Apply to right knee topically three times a day for right knee pain.   famotidine (PEPCID) 10 MG tablet Take 10 mg by mouth daily.   furosemide (LASIX) 20 MG tablet Take 1 tablet (20 mg total) by mouth daily.   gabapentin (NEURONTIN) 100 MG  capsule Take 1 capsule (100 mg total) by mouth 3 (three) times daily. (Patient taking differently: Take 200 mg by mouth 3  (three) times daily.)   hydrALAZINE (APRESOLINE) 25 MG tablet Take 1 tablet (25 mg total) by mouth every 8 (eight) hours.   losartan (COZAAR) 50 MG tablet Take 1 tablet (50 mg total) by mouth daily.   pantoprazole (PROTONIX) 40 MG tablet Take 1 tablet (40 mg total) by mouth daily.   senna-docusate (SENOKOT-S) 8.6-50 MG tablet Take 1 tablet by mouth at bedtime.   tamsulosin (FLOMAX) 0.4 MG CAPS capsule Take 1 capsule (0.4 mg total) by mouth daily after supper.   traMADol (ULTRAM) 50 MG tablet Take 1 tablet (50 mg total) by mouth every 6 (six) hours as needed for severe pain or moderate pain.   No facility-administered encounter medications on file as of 10/17/2022.    HOSPICE ELIGIBILITY/DIAGNOSIS: TBD  PAST MEDICAL HISTORY:  Past Medical History:  Diagnosis Date   A-fib ([place])    Abscess of jaw 07/19/2020   Acute on chronic heart failure with preserved ejection fraction ([place]) 07/08/2020   Aortic stenosis    a. s/p pericardial AVR 2015.   Aortic valve stenosis 07/19/2020   Bacteremia 07/19/2020   Bacterial endocarditis    BPH (benign prostatic hyperplasia) 07/08/2020   CAD (coronary artery disease)    a. s/p CABG, AVR, LAA clipping 2015 at [facility].   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/08/2020   Elevated troponin 07/08/2020   Essential hypertension 07/08/2020   Generalized abdominal pain    History of noncompliance with medical treatment    Hyperlipemia 07/08/2020   Infection associated with implant ([place]) 07/19/2020   Malnutrition ([place])    MRSA bacteremia    Nonrheumatic mitral valve regurgitation 07/19/2020    Normocytic anemia 07/08/2020   Pressure injury of skin 05/12/2020   S/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 2015   Severe malnutrition ([place]) 07/19/2020   Thrombocytopenia ([place]) 07/08/2020   TIA (transient ischemic attack)    Weight loss 07/19/2020      ALLERGIES:  Allergies  Allergen Reactions   Chocolate Rash    9.9.2022 Daughter reports that the patient regularly eats chocolate without any issues.   Peanut-Containing Drug Products Rash      I spent 35 minutes providing this consultation; this includes time spent with patient/family, chart review and documentation. More than 50% of the time in this consultation was spent on counseling and coordinating communication   Thank you for the opportunity to participate in the care of [place] Please call our office at [phone] if we can be of additional assistance.  Note: Portions of this note were generated with Lobbyist. Dictation errors may occur despite best attempts at proofreading.  Teodoro Spray, NP

## 2022-12-25 ENCOUNTER — Non-Acute Institutional Stay: Payer: Medicare Other | Admitting: Hospice

## 2022-12-25 DIAGNOSIS — I69151 Hemiplegia and hemiparesis following nontraumatic intracerebral hemorrhage affecting right dominant side: Secondary | ICD-10-CM

## 2022-12-25 DIAGNOSIS — I69391 Dysphagia following cerebral infarction: Secondary | ICD-10-CM

## 2022-12-25 DIAGNOSIS — R52 Pain, unspecified: Secondary | ICD-10-CM

## 2022-12-25 DIAGNOSIS — F339 Major depressive disorder, recurrent, unspecified: Secondary | ICD-10-CM

## 2022-12-25 DIAGNOSIS — K5901 Slow transit constipation: Secondary | ICD-10-CM

## 2022-12-25 DIAGNOSIS — Z515 Encounter for palliative care: Secondary | ICD-10-CM

## 2022-12-25 DIAGNOSIS — F419 Anxiety disorder, unspecified: Secondary | ICD-10-CM

## 2022-12-25 NOTE — Progress Notes (Signed)
[facility]er, [facility] Palliative Care Consult Note Telephone: [phone]  Fax: [phone]  PATIENT NAME: Carlos Welch DOB: December 21, 1945 MRN: [id number]  PRIMARY CARE PROVIDER:   Dr. Olivia Mackie Goen/[facility] NP  REFERRING PROVIDER: Elise Benne EllisvillePercell Miller, PA-C [address] [place],  [place] [address]  RESPONSIBLE PARTY:  Self Contact Information     Name Relation Home Work Mobile   Carlos Welch Daughter   [phone]      Palliative Care was asked to follow this patient to address advance care planning, complex medical decision making and goals of care clarification.  Visit is to build trust and highlight Palliative Medicine as specialized medical care for people living with serious illness, aimed at facilitating better quality of life through symptoms relief, assisting with advance care planning and complex medical decision making. This is a follow up visit.  RECOMMENDATIONS/PLAN:   Advance Care Planning/Code Status: Patient is a full code  Goals of Care: Goals of care include to maximize quality of life and symptom management. Palliative care team will continue to support patient, patient's family, and medical team.  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  Patient reports his ongoing facility restorative physical activity is helpful.  He ambulates short distance with rolling walker, gets around mostly on his wheelchair self-propelling.  Continue restorative physical activities to ensure optimal wellbeing.  Encourage physical activity as tolerated to optimize wellbeing.  Continue facility restorative exercises.  Fall precautions.   Dysphagia: Poststroke.  Continue mechanical soft diet. Aspiration precautions.   Pain to Right side: related to R hemiplegia/hemiparesis.  Continue gabapentin as ordered.    Anxiety: Stable.  Continue hydroxyzine as ordered.  Depression: Stable, managed with Zoloft.   Patient reports no sadness or moodiness today, eats in the dining and participates in some facility activities.  Continue to encourage socialization and participation in facility activities.  Psych following.   Constipation: Stable. Managed with [place].   Follow up: Palliative care will continue to follow for complex medical decision making, advance care planning, and clarification of goals. Return 6 weeks or prn. Encouraged to call provider sooner with any concerns.  CHIEF COMPLAINT: Palliative follow up visit  HISTORY OF PRESENT ILLNESS:  Carlos Welch a 77 y.o. 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  Patient was seen in his room and also in the dining area with other residents.  He denies pain/discomfort reports he is overall doing well.  No hospitalizations since last visit.   History obtained from review of EMR, discussion with primary team and/or patient. Records reviewed and summarized above. All 10 point systems reviewed and are negative except as documented in history of present illness above  Review and summarization of Epic records shows history from other than patient.  I reviewed, as needed, available labs, patient records, imaging, studies and related documents from the EMR.    PERTINENT MEDICATIONS:  Outpatient Encounter Medications as of 12/25/2022  Medication Sig   acetaminophen (TYLENOL) 325 MG tablet Take 2 tablets (650 mg total) by mouth every 4 (four) hours as needed for mild pain (or temp > 37.5 C (99.5 F)).   atorvastatin (LIPITOR) 10 MG tablet Take 10 mg by mouth daily.   diclofenac Sodium (VOLTAREN) 1 % GEL Apply 1 Application topically 3 (three) times daily. Apply to right knee topically three times  a day for right knee pain.   famotidine (PEPCID) 10 MG tablet Take 10 mg by mouth daily.    furosemide (LASIX) 20 MG tablet Take 1 tablet (20 mg total) by mouth daily.   gabapentin (NEURONTIN) 100 MG capsule Take 1 capsule (100 mg total) by mouth 3 (three) times daily. (Patient taking differently: Take 200 mg by mouth 3 (three) times daily.)   hydrALAZINE (APRESOLINE) 25 MG tablet Take 1 tablet (25 mg total) by mouth every 8 (eight) hours.   losartan (COZAAR) 50 MG tablet Take 1 tablet (50 mg total) by mouth daily.   pantoprazole (PROTONIX) 40 MG tablet Take 1 tablet (40 mg total) by mouth daily.   senna-docusate (SENOKOT-S) 8.6-50 MG tablet Take 1 tablet by mouth at bedtime.   tamsulosin (FLOMAX) 0.4 MG CAPS capsule Take 1 capsule (0.4 mg total) by mouth daily after supper.   traMADol (ULTRAM) 50 MG tablet Take 1 tablet (50 mg total) by mouth every 6 (six) hours as needed for severe pain or moderate pain.   No facility-administered encounter medications on file as of 12/25/2022.    HOSPICE ELIGIBILITY/DIAGNOSIS: TBD  PAST MEDICAL HISTORY:  Past Medical History:  Diagnosis Date   A-fib ([place])    Abscess of jaw 07/19/2020   Acute on chronic heart failure with preserved ejection fraction ([place]) 07/08/2020   Aortic stenosis    a. s/p pericardial AVR 2015.   Aortic valve stenosis 07/19/2020   Bacteremia 07/19/2020   Bacterial endocarditis    BPH (benign prostatic hyperplasia) 07/08/2020   CAD (coronary artery disease)    a. s/p CABG, AVR, LAA clipping 2015 at [facility].   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/08/2020   Elevated troponin 07/08/2020   Essential hypertension 07/08/2020   Generalized abdominal pain     History of noncompliance with medical treatment    Hyperlipemia 07/08/2020   Infection associated with implant ([place]) 07/19/2020   Malnutrition ([place])    MRSA bacteremia    Nonrheumatic mitral valve regurgitation 07/19/2020   Normocytic anemia 07/08/2020   Pressure injury of skin 05/12/2020   S/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 2015   Severe malnutrition ([place]) 07/19/2020   Thrombocytopenia ([place]) 07/08/2020   TIA (transient ischemic attack)    Weight loss 07/19/2020      ALLERGIES:  Allergies  Allergen Reactions   Chocolate Rash    9.9.2022 Daughter reports that the patient regularly eats chocolate without any issues.   Peanut-Containing Drug Products Rash      I spent 35 minutes providing this consultation; this includes time spent with patient/family, chart review and documentation. More than 50% of the time in this consultation was spent on counseling and coordinating communication.  Thank you for the opportunity to participate in the care of Augusta Omura Please call our office at [phone] if we can be of additional assistance.  Note: Portions of this note were generated with Lobbyist. Dictation errors may occur despite best attempts at proofreading.  Teodoro Spray, NP

## 2023-02-12 ENCOUNTER — Non-Acute Institutional Stay: Payer: Medicare Other | Admitting: Hospice

## 2023-02-12 DIAGNOSIS — I69391 Dysphagia following cerebral infarction: Secondary | ICD-10-CM

## 2023-02-12 DIAGNOSIS — F419 Anxiety disorder, unspecified: Secondary | ICD-10-CM

## 2023-02-12 DIAGNOSIS — I69151 Hemiplegia and hemiparesis following nontraumatic intracerebral hemorrhage affecting right dominant side: Secondary | ICD-10-CM

## 2023-02-12 DIAGNOSIS — Z515 Encounter for palliative care: Secondary | ICD-10-CM

## 2023-02-12 DIAGNOSIS — F339 Major depressive disorder, recurrent, unspecified: Secondary | ICD-10-CM

## 2023-02-12 NOTE — Progress Notes (Signed)
Therapist, nutritional Palliative Care Consult Note Telephone: [phone]  Fax: [phone]  PATIENT NAME: Carlos Welch Welch DOB: 08/08/1946 MRN: [id number]  PRIMARY CARE PROVIDER:   Dr. French Ana Goen/[facility] NP  REFERRING PROVIDER: Marisue Brooklyn SaguierRamon Dredge, PA-C [address] [place],  [place] [address]  RESPONSIBLE PARTY:  Self Contact Information     Name Relation Home Work Mobile   Hearn,Heather Daughter   [phone]      Palliative Care was asked to follow this patient to address advance care planning, complex medical decision making and goals of care clarification.  Visit is to build trust and highlight Palliative Medicine as specialized medical care for people living with serious illness, aimed at facilitating better quality of life through symptoms relief, assisting with advance care planning and complex medical decision making. This is a follow up visit.  RECOMMENDATIONS/PLAN:   Advance Care Planning/Code Status: Patient is a full code  Goals of Care: Goals of care include to maximize quality of life and symptom management. Palliative care team will continue to support patient, patient's family, and medical team.  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  Encourage physical activity to optimize wellbeing.  Fall precautions; he ambulates short distance with rolling walker, gets around mostly on his wheelchair self-propelling.  Continue restorative physical activities.  Dysphagia: Continue mechanical soft diet. Aspiration precautions.  Continue Boost as ordered  Anxiety: Stable.  Continue hydroxyzine as ordered.  De-escalation techniques  Depression: Stable, managed with Zoloft. Continue to encourage socialization and participation in facility activities.  Psych consult as needed.  Follow up: Palliative care will continue to follow for complex medical decision making, advance care planning, and  clarification of goals. Return 6 weeks or prn. Encouraged to call provider sooner with any concerns.  CHIEF COMPLAINT: Palliative follow up visit  HISTORY OF PRESENT ILLNESS:  Carlos Welch Welch a 77 y.o. 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  Patient was seen in his room and also in the TV room other residents.   Patient reports no sadness or moodiness today, eats in the dining and participates in bingo and other facility activities.   He denies pain/discomfort reports he is overall doing well.  No hospitalizations since last visit.  Nursing with no concerns today. History obtained from review of EMR, discussion with primary team and/or patient. Records reviewed and summarized above. All 10 point systems reviewed and are negative except as documented in history of present illness above  Review and summarization of Epic records shows history from other than patient.  I reviewed, as needed, available labs, patient records, imaging, studies and related documents from the EMR.    PERTINENT MEDICATIONS:  Outpatient Encounter Medications as of 02/12/2023  Medication Sig   acetaminophen (TYLENOL) 325 MG tablet Take 2 tablets (650 mg total) by mouth every 4 (four) hours as needed for mild pain (or temp > 37.5 C (99.5 F)).   atorvastatin (LIPITOR) 10 MG tablet Take 10 mg by mouth daily.   diclofenac Sodium (VOLTAREN) 1 % GEL Apply 1 Application topically 3 (three) times daily. Apply to right knee topically three times a day for right knee pain.   famotidine (PEPCID) 10 MG tablet Take 10 mg by mouth daily.   furosemide (LASIX) 20 MG tablet Take 1 tablet (20 mg total) by mouth daily.  gabapentin (NEURONTIN) 100 MG capsule Take 1 capsule (100 mg total) by mouth 3 (three) times daily. (Patient taking differently: Take 200 mg by mouth 3  (three) times daily.)   hydrALAZINE (APRESOLINE) 25 MG tablet Take 1 tablet (25 mg total) by mouth every 8 (eight) hours.   losartan (COZAAR) 50 MG tablet Take 1 tablet (50 mg total) by mouth daily.   pantoprazole (PROTONIX) 40 MG tablet Take 1 tablet (40 mg total) by mouth daily.   senna-docusate (SENOKOT-S) 8.6-50 MG tablet Take 1 tablet by mouth at bedtime.   tamsulosin (FLOMAX) 0.4 MG CAPS capsule Take 1 capsule (0.4 mg total) by mouth daily after supper.   traMADol (ULTRAM) 50 MG tablet Take 1 tablet (50 mg total) by mouth every 6 (six) hours as needed for severe pain or moderate pain.   No facility-administered encounter medications on file as of 02/12/2023.    HOSPICE ELIGIBILITY/DIAGNOSIS: TBD  PAST MEDICAL HISTORY:  Past Medical History:  Diagnosis Date   A-fib ([facility])    Abscess of jaw 07/19/2020   Acute on chronic heart failure with preserved ejection fraction ([facility]) 07/08/2020   Aortic stenosis    a. s/p pericardial AVR 2015.   Aortic valve stenosis 07/19/2020   Bacteremia 07/19/2020   Bacterial endocarditis    BPH (benign prostatic hyperplasia) 07/08/2020   CAD (coronary artery disease)    a. s/p CABG, AVR, L[facility] clipping [facility] at [facility].   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/08/2020   Elevated troponin 07/08/2020   Essential hypertension 07/08/2020   Generalized abdominal pain    History of noncompliance with medical treatment    Hyperlipemia 07/08/2020   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   Malnutrition ([facility])    MRSA bacteremia    Nonrheumatic mitral valve regurgitation 07/19/2020    Normocytic anemia 07/08/2020   Pressure injury of skin 05/12/2020   S/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 2015   Severe malnutrition ([facility]) 07/19/2020   Thrombocytopenia ([facility]) 07/08/2020   TIA (transient ischemic attack)    Weight loss 07/19/2020      ALLERGIES:  Allergies  Allergen Reactions   Chocolate Rash    9.9.2022 Daughter reports that the patient regularly eats chocolate without any issues.   Peanut-Containing Drug Products Rash      I spent 35 minutes providing this consultation; this includes time spent with patient/family, chart review and documentation. More than 50% of the time in this consultation was spent on counseling and coordinating communication.  Thank you for the opportunity to participate in the care of Hardie L Brunn Please call our office at [phone] if we can be of additional assistance.  Note: Portions of this note were generated with Scientist, clinical (histocompatibility and immunogenetics). Dictation errors may occur despite best attempts at proofreading.  Rosaura Carpenter, NP

## 2023-03-14 ENCOUNTER — Non-Acute Institutional Stay: Payer: Medicare Other | Admitting: Hospice

## 2023-03-14 DIAGNOSIS — I69391 Dysphagia following cerebral infarction: Secondary | ICD-10-CM

## 2023-03-14 DIAGNOSIS — F339 Major depressive disorder, recurrent, unspecified: Secondary | ICD-10-CM

## 2023-03-14 DIAGNOSIS — F419 Anxiety disorder, unspecified: Secondary | ICD-10-CM

## 2023-03-14 DIAGNOSIS — Z515 Encounter for palliative care: Secondary | ICD-10-CM

## 2023-03-14 DIAGNOSIS — I69151 Hemiplegia and hemiparesis following nontraumatic intracerebral hemorrhage affecting right dominant side: Secondary | ICD-10-CM

## 2023-03-14 NOTE — Progress Notes (Signed)
Therapist, nutritional Palliative Care Consult Note Telephone: [phone]  Fax: [phone]  PATIENT NAME: Carlos Welch DOB: January 24, 1946 MRN: [id number]  PRIMARY CARE PROVIDER:   Dr. French Ana Goen/[facility] NP  REFERRING PROVIDER: Marisue Brooklyn SaguierRamon Dredge, PA-C [address] [place],  [place] [address]  RESPONSIBLE PARTY:  Self Contact Information     Name Relation Home Work Mobile   Hearn,Heather Daughter   [phone]      Palliative Care was asked to follow this patient to address advance care planning, complex medical decision making and goals of care clarification.  Visit is to build trust and highlight Palliative Medicine as specialized medical care for people living with serious illness, aimed at facilitating better quality of life through symptoms relief, assisting with advance care planning and complex medical decision making.  This is a follow up visit.  Visit consisted of counseling and education dealing with the complex and emotionally intense issues of symptom management and palliative care in the setting of serious and potentially life-threatening illness. Palliative care team will continue to support patient, patient's family, and medical team.  RECOMMENDATIONS/PLAN:   Advance Care Planning/Code Status: Patient is a full code  Goals of Care: Goals of care include to maximize quality of life and symptom management. Palliative care team will continue to support patient, patient's family, and medical team.  Symptom management/Plan:  Patient has been fairly stable in the last 6 months, no acuities, no ED visits/hospitalization.  Will continue to monitor patient.  No changes to plan of care.  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  Encourage physical activity to optimize wellbeing.  Fall precautions; he ambulates short distance with rolling walker, gets around mostly on his wheelchair self-propelling.  Continue  restorative physical activities.  Dysphagia: Continue mechanical soft diet. Aspiration precautions.  Continue Boost as ordered  Anxiety: Stable.  Continue hydroxyzine as ordered.  De-escalation techniques  Depression: Stable, managed with Zoloft. Continue to encourage socialization and participation in facility activities.  Psych consult as needed.  Follow up: Palliative care will continue to follow for complex medical decision making, advance care planning, and clarification of goals. Return 6 weeks or prn. Encouraged to call provider sooner with any concerns.  CHIEF COMPLAINT: Palliative follow up visit  HISTORY OF PRESENT ILLNESS:  Carlos Welch a 77 y.o. 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  Patient with no complaints today; patient reports no sadness or moodiness, eats in the dining and participates in bingo and other facility activities.   He denies pain/discomfort reports he is overall doing well.  No hospitalizations since last visit.  Nursing with no concerns today. History obtained from review of EMR, discussion with primary team and/or patient. Records reviewed and summarized above. All 10 point systems reviewed and are negative except as documented in history of present illness above  Review and summarization of Epic records shows history from other than patient.  I reviewed, as needed, available labs, patient records, imaging, studies and related documents from the EMR.    PERTINENT MEDICATIONS:  Outpatient Encounter Medications as of 03/14/2023  Medication Sig   acetaminophen (TYLENOL) 325 MG tablet Take 2 tablets (650 mg total) by mouth every 4 (four) hours as needed for mild pain (or temp > 37.5 C (99.5 F)).   atorvastatin (LIPITOR) 10 MG tablet Take 10 mg by mouth  daily.   diclofenac Sodium (VOLTAREN) 1  % GEL Apply 1 Application topically 3 (three) times daily. Apply to right knee topically three times a day for right knee pain.   famotidine (PEPCID) 10 MG tablet Take 10 mg by mouth daily.   furosemide (LASIX) 20 MG tablet Take 1 tablet (20 mg total) by mouth daily.   gabapentin (NEURONTIN) 100 MG capsule Take 1 capsule (100 mg total) by mouth 3 (three) times daily. (Patient taking differently: Take 200 mg by mouth 3 (three) times daily.)   hydrALAZINE (APRESOLINE) 25 MG tablet Take 1 tablet (25 mg total) by mouth every 8 (eight) hours.   losartan (COZAAR) 50 MG tablet Take 1 tablet (50 mg total) by mouth daily.   pantoprazole (PROTONIX) 40 MG tablet Take 1 tablet (40 mg total) by mouth daily.   senna-docusate (SENOKOT-S) 8.6-50 MG tablet Take 1 tablet by mouth at bedtime.   tamsulosin (FLOMAX) 0.4 MG CAPS capsule Take 1 capsule (0.4 mg total) by mouth daily after supper.   traMADol (ULTRAM) 50 MG tablet Take 1 tablet (50 mg total) by mouth every 6 (six) hours as needed for severe pain or moderate pain.   No facility-administered encounter medications on file as of 03/14/2023.    HOSPICE ELIGIBILITY/DIAGNOSIS: TBD  PAST MEDICAL HISTORY:  Past Medical History:  Diagnosis Date   A-fib ([facility])    Abscess of jaw 07/19/2020   Acute on chronic heart failure with preserved ejection fraction ([facility]) 07/08/2020   Aortic stenosis    a. s/p pericardial AVR 2015.   Aortic valve stenosis 07/19/2020   Bacteremia 07/19/2020   Bacterial endocarditis    BPH (benign prostatic hyperplasia) 07/08/2020   CAD (coronary artery disease)    a. s/p CABG, AVR, LAA clipping 2015 at [facility].   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/08/2020   Elevated troponin 07/08/2020   Essential hypertension 07/08/2020   Generalized abdominal pain    History of noncompliance with medical treatment    Hyperlipemia 07/08/2020   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   Malnutrition ([facility])    MRSA bacteremia    Nonrheumatic mitral valve regurgitation 07/19/2020   Normocytic anemia 07/08/2020   Pressure injury of skin 05/12/2020   S/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 2015   Severe malnutrition ([facility]) 07/19/2020   Thrombocytopenia ([facility]) 07/08/2020   TIA (transient ischemic attack)    Weight loss 07/19/2020      ALLERGIES:  Allergies  Allergen Reactions   Chocolate Rash    9.9.2022 Daughter reports that the patient regularly eats chocolate without any issues.   Peanut-Containing Drug Products Rash      I spent 35 minutes providing this consultation; this includes time spent with patient/family, chart review and documentation. More than 50% of the time in this consultation was spent on counseling and coordinating communication.  Thank you for the opportunity to participate in the care of Dallan L Ahola Please call our office at [phone] if we can be of additional assistance.  Note: Portions of this note were generated with Scientist, clinical (histocompatibility and immunogenetics). Dictation errors may occur despite best attempts at proofreading.  Rosaura Carpenter,  NP

## 2023-08-17 ENCOUNTER — Encounter: Payer: Self-pay | Admitting: Vascular Surgery

## 2023-08-17 ENCOUNTER — Ambulatory Visit (INDEPENDENT_AMBULATORY_CARE_PROVIDER_SITE_OTHER): Payer: Medicare Other | Admitting: Vascular Surgery

## 2023-08-17 VITALS — BP 187/126 | HR 57 | Temp 98.1°F | Resp 20 | Ht 74.0 in | Wt 172.0 lb

## 2023-08-17 DIAGNOSIS — I739 Peripheral vascular disease, unspecified: Secondary | ICD-10-CM

## 2023-08-17 DIAGNOSIS — I10 Essential (primary) hypertension: Secondary | ICD-10-CM | POA: Diagnosis not present

## 2023-08-17 DIAGNOSIS — I5020 Unspecified systolic (congestive) heart failure: Secondary | ICD-10-CM | POA: Diagnosis not present

## 2023-08-17 DIAGNOSIS — E785 Hyperlipidemia, unspecified: Secondary | ICD-10-CM

## 2023-08-17 NOTE — Progress Notes (Signed)
Patient ID: Carlos Welch, male   DOB: 04/22/1946, 77 y.o.   MRN: [id number]  Reason for Consult: New Patient (Initial Visit)   Referred by Durward Parcel, FNP  Subjective:     HPI  Carlos Welch is a 77 y.o. male with CAD, status post CABG 2015, CHF, HTN, HLD presenting for evaluation of PAD.  He resides at a facility and has not walked in over 2 years.  He does not know why that arterial studies were obtained.  He denies pain or wounds on the feet.  He does have some swelling and discoloration of his legs but denies leg pain.  Past Medical History:  Diagnosis Date   A-fib (HCC)    Abscess of jaw 07/19/2020   Acute on chronic heart failure with preserved ejection fraction (HCC) 07/08/2020   Aortic stenosis    a. s/p pericardial AVR 2015.   Aortic valve stenosis 07/19/2020   Bacteremia 07/19/2020   Bacterial endocarditis    BPH (benign prostatic hyperplasia) 07/08/2020   CAD (coronary artery disease)    a. s/p CABG, AVR, LAA clipping 2015 at [facility].   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/08/2020   Elevated troponin 07/08/2020   Essential hypertension 07/08/2020   Generalized abdominal pain    History of noncompliance with medical treatment    Hyperlipemia 07/08/2020   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   Malnutrition (HCC)    MRSA bacteremia    Nonrheumatic mitral valve regurgitation 07/19/2020   Normocytic anemia 07/08/2020   Pressure injury of skin 05/12/2020   S/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 2015   Severe malnutrition (HCC) 07/19/2020   Thrombocytopenia (HCC) 07/08/2020    TIA (transient ischemic attack)    Weight loss 07/19/2020   Family History  Problem Relation Age of Onset   CAD Neg Hx    Past Surgical History:  Procedure Laterality Date   AORTIC VALVE REPLACEMENT     CORONARY ARTERY BYPASS GRAFT  2015   HERNIA REPAIR     REMOVAL OF IMPLANT  03/14/2020   TEE WITHOUT CARDIOVERSION N/A 05/13/2020   Procedure: TRANSESOPHAGEAL ECHOCARDIOGRAM (TEE);  Surgeon: Lewayne Bunting, MD;  Location: [facility] ENDOSCOPY;  Service: Cardiovascular;  Laterality: N/A;    Short Social History:  Social History   Tobacco Use   Smoking status: Former    Current packs/day: 0.00    Average packs/day: 3.0 packs/day for 47.8 years (143.3 ttl pk-yrs)    Types: Cigarettes, Cigars    Start date: 1964    Quit date: 07/28/2010    Years since quitting: 13.0   Smokeless tobacco: Never  Substance Use Topics   Alcohol use: No    Allergies  Allergen Reactions   Chocolate Rash    9.9.2022 Daughter reports that the patient regularly eats chocolate without any issues.   Peanut-Containing Drug Products Rash    Current Outpatient Medications  Medication Sig Dispense Refill   acetaminophen (TYLENOL) 325 MG tablet Take 2 tablets (650 mg total) by mouth every 4 (four) hours as needed for mild pain (or temp > 37.5 C (99.5 F)).     atorvastatin (LIPITOR)  10 MG tablet Take 10 mg by mouth daily.     diclofenac Sodium (VOLTAREN) 1 % GEL Apply 1 Application topically 3 (three) times daily. Apply to right knee topically three times a day for right knee pain.     famotidine (PEPCID) 10 MG tablet Take 10 mg by mouth daily.     furosemide (LASIX) 20 MG tablet Take 1 tablet (20 mg total) by mouth daily. 30 tablet    gabapentin (NEURONTIN) 100 MG capsule Take 1 capsule (100 mg total) by mouth 3 (three) times daily. (Patient taking differently: Take 200 mg by mouth 3 (three) times daily.)     hydrALAZINE (APRESOLINE) 25 MG tablet Take 1 tablet (25 mg total) by mouth every 8 (eight) hours.      losartan (COZAAR) 50 MG tablet Take 1 tablet (50 mg total) by mouth daily.     pantoprazole (PROTONIX) 40 MG tablet Take 1 tablet (40 mg total) by mouth daily. 30 tablet 0   senna-docusate (SENOKOT-S) 8.6-50 MG tablet Take 1 tablet by mouth at bedtime.     tamsulosin (FLOMAX) 0.4 MG CAPS capsule Take 1 capsule (0.4 mg total) by mouth daily after supper. 30 capsule    traMADol (ULTRAM) 50 MG tablet Take 1 tablet (50 mg total) by mouth every 6 (six) hours as needed for severe pain or moderate pain. 5 tablet 0   No current facility-administered medications for this visit.    REVIEW OF SYSTEMS  Negative other than noted in HPI     Objective:  Objective   Vitals:   08/17/23 1356  BP: (!) 187/126  Pulse: (!) 57  Resp: 20  Temp: 98.1 F (36.7 C)  SpO2: 96%  Weight: 172 lb (78 kg)  Height: 6\' 2"  (1.88 m)   Body mass index is 22.08 kg/m.  Physical Exam General: no acute distress Cardiac: hemodynamically stable, nontachycardic Pulm: normal work of breathing Neuro: alert, no focal deficit Extremities: 1+ pitting edema with discoloration of shins bilaterally, no cyanosis or wounds Vascular:   Right: Nonpalpable pedal's  Left: Nonpalpable pedal's Unable to palpate femoral pulses due to seated in wheelchair   Data: ABI done by [facility] independently reviewed Right 0.47 Left 0.51  Echo performed in 2022 EF 60 to 65%, moderately reduced RV function, aortic bioprosthetic valve.  Mildly dilated LA, moderately dilated RA  Creatinine from last year 1.09     Assessment/Plan:     Carlos Welch is a 77 y.o. male with asymptomatic PAD and leg swelling.  He is nonambulatory and has not walked in over 2 years.  I explained that although he has poor perfusion there is no need to intervene on an asymptomatic nonambulatory patient.  I wrote to the facility that he should have some light knee-high compression to improve his lower extremity edema.  Follow-up as  needed   Recommendations to optimize cardiovascular risk: Abstinence from all tobacco products. Blood glucose control with goal A1c < 7%. Blood pressure control with goal blood pressure < 140/90 mmHg. Lipid reduction therapy with goal LDL-C <100 mg/dL  Aspirin 81mg  PO QD.  Atorvastatin 40-80mg  PO QD (or other "high intensity" statin therapy).     Daria Pastures MD [facility]

## 2024-07-02 ENCOUNTER — Other Ambulatory Visit: Payer: Self-pay

## 2024-07-02 DIAGNOSIS — I739 Peripheral vascular disease, unspecified: Secondary | ICD-10-CM

## 2024-07-10 ENCOUNTER — Telehealth: Payer: Self-pay

## 2024-07-10 NOTE — Telephone Encounter (Signed)
 Called facility x3 to get in touch with Madelin Calin to r/s pt from 08/01/2024. PA is OOO on that day. Unable to r/s until Tammy calls back.

## 2024-08-01 ENCOUNTER — Encounter (HOSPITAL_COMMUNITY)

## 2024-08-01 ENCOUNTER — Ambulatory Visit

## 2024-08-13 ENCOUNTER — Ambulatory Visit (HOSPITAL_COMMUNITY)
Admission: RE | Admit: 2024-08-13 | Discharge: 2024-08-13 | Disposition: A | Discharge status: Home / Self Care | Source: Ambulatory Visit | Attending: Vascular Surgery | Admitting: Vascular Surgery

## 2024-08-13 ENCOUNTER — Ambulatory Visit (INDEPENDENT_AMBULATORY_CARE_PROVIDER_SITE_OTHER): Admitting: Physician Assistant

## 2024-08-13 VITALS — BP 163/82 | HR 54 | Temp 98.3°F

## 2024-08-13 DIAGNOSIS — I739 Peripheral vascular disease, unspecified: Secondary | ICD-10-CM | POA: Diagnosis present

## 2024-08-13 LAB — VAS US ABI WITH/WO TBI
Left ABI: 1.23
Right ABI: 1.03

## 2024-08-28 NOTE — Progress Notes (Signed)
 Office Note   History of Present Illness   Carlos Welch is a 78 y.o. (Jul 15, 1946) male who presents for evaluation of a right second toe wound.  The patient was previously evaluated by our office last year for asymptomatic PAD and bilateral lower extremity swelling.  No vascular intervention was needed given that he was asymptomatic and also nonambulatory.  He returns today for repeat evaluation.  He says back in September he had a small wound to the tip of his right second toe.  This was debrided at his facility.  Eventually this healed after a couple of weeks.  He denies any recurrent wounds since then.  He remains nonambulatory and mobilizes via wheelchair.   Current Outpatient Medications  Medication Sig Dispense Refill   acetaminophen  (TYLENOL ) 325 MG tablet Take 2 tablets (650 mg total) by mouth every 4 (four) hours as needed for mild pain (or temp > 37.5 C (99.5 F)).     atorvastatin  (LIPITOR) 10 MG tablet Take 10 mg by mouth daily.     diclofenac  Sodium (VOLTAREN ) 1 % GEL Apply 1 Application topically 3 (three) times daily. Apply to right knee topically three times a day for right knee pain.     famotidine  (PEPCID ) 10 MG tablet Take 10 mg by mouth daily.     furosemide  (LASIX ) 20 MG tablet Take 1 tablet (20 mg total) by mouth daily. 30 tablet    gabapentin  (NEURONTIN ) 100 MG capsule Take 1 capsule (100 mg total) by mouth 3 (three) times daily. (Patient taking differently: Take 200 mg by mouth 3 (three) times daily.)     hydrALAZINE  (APRESOLINE ) 25 MG tablet Take 1 tablet (25 mg total) by mouth every 8 (eight) hours.     losartan  (COZAAR ) 50 MG tablet Take 1 tablet (50 mg total) by mouth daily.     pantoprazole  (PROTONIX ) 40 MG tablet Take 1 tablet (40 mg total) by mouth daily. 30 tablet 0   senna-docusate (SENOKOT-S) 8.6-50 MG tablet Take 1 tablet by mouth at bedtime.     tamsulosin  (FLOMAX ) 0.4 MG CAPS capsule Take 1 capsule (0.4 mg total) by mouth daily after supper. 30 capsule     traMADol  (ULTRAM ) 50 MG tablet Take 1 tablet (50 mg total) by mouth every 6 (six) hours as needed for severe pain or moderate pain. 5 tablet 0   No current facility-administered medications for this visit.    REVIEW OF SYSTEMS (negative unless checked):   Cardiac:  []  Chest pain or chest pressure? []  Shortness of breath upon activity? []  Shortness of breath when lying flat? []  Irregular heart rhythm?  Vascular:  []  Pain in calf, thigh, or hip brought on by walking? []  Pain in feet at night that wakes you up from your sleep? []  Blood clot in your veins? [x]  Leg swelling?  Pulmonary:  []  Oxygen at home? []  Productive cough? []  Wheezing?  Neurologic:  []  Sudden weakness in arms or legs? []  Sudden numbness in arms or legs? []  Sudden onset of difficult speaking or slurred speech? []  Temporary loss of vision in one eye? []  Problems with dizziness?  Gastrointestinal:  []  Blood in stool? []  Vomited blood?  Genitourinary:  []  Burning when urinating? []  Blood in urine?  Psychiatric:  []  Major depression  Hematologic:  []  Bleeding problems? []  Problems with blood clotting?  Dermatologic:  []  Rashes or ulcers?  Constitutional:  []  Fever or chills?  Ear/Nose/Throat:  []  Change in hearing? []  Nose bleeds? []  Sore  throat?  Musculoskeletal:  []  Back pain? []  Joint pain? []  Muscle pain?   Physical Examination   Vitals:   08/13/24 0829  BP: (!) 163/82  Pulse: (!) 54  Temp: 98.3 F (36.8 C)  TempSrc: Temporal   There is no height or weight on file to calculate BMI.  General: Chronically ill-appearing, in wheelchair, no acute distress Gait: Not observed HENT: WNL, normocephalic Pulmonary: normal non-labored breathing  Cardiac: Regular Abdomen: soft, NT, no masses Skin: without rashes Vascular Exam/Pulses: Monophasic DP/PT Doppler signals bilaterally Extremities: without ischemic changes, without gangrene , without cellulitis; without open wounds;   Musculoskeletal: no muscle wasting or atrophy  Neurologic: A&O X 3;  No focal weakness or paresthesias are detected Psychiatric:  The pt has Normal affect.  Non-Invasive Vascular imaging   ABI (08/13/2024) R:  ABI: 1.03  PT: mono DP: mono TBI:  0.96 L:  ABI: 1.23   PT: mono DP: mono TBI: 0.92   Medical Decision Making   Carlos Welch is a 78 y.o. male who presents for evaluation of a right foot wound  The patient has previously been evaluated by our office for asymptomatic PAD.  Repeat ABIs on the right were 1.03 and on the left were 1.23.  He has monophasic tibial vessel flow. He was referred back to our clinic for a right second toe wound.  The patient states that this happened back in September and took a couple of weeks to heal after debridement.  He denies any other wounds.  He denies any rest pain. The patient is a long-term resident at his nursing facility and has been nonambulatory for several years. On exam he has nonpalpable pedal pulses and monophasic DP/PT Doppler signals bilaterally.  He has no arterial ulcerations.  He has mild bilateral lower extremity swelling, which is known. Given that the patient's wound has healed and he is also nonambulatory, we would not recommend any vascular invention.  He can follow-up with our office as needed   Ahmed Holster PA-C [facility] [place] Office: [phone]  Clinic MD: Sheree
